# Patient Record
Sex: Female | Born: 1950 | Race: Black or African American | Hispanic: No | Smoking: Former smoker
Health system: Southern US, Community
[De-identification: ages and names within clinical notes are randomized; demographics above are authoritative.]

## PROBLEM LIST (undated history)

## (undated) ENCOUNTER — Ambulatory Visit (HOSPITAL_COMMUNITY): Admission: EM | Discharge status: Absent without leave | Payer: Medicare Other

## (undated) DIAGNOSIS — N189 Chronic kidney disease, unspecified: Secondary | ICD-10-CM

## (undated) DIAGNOSIS — M51369 Other intervertebral disc degeneration, lumbar region without mention of lumbar back pain or lower extremity pain: Secondary | ICD-10-CM

## (undated) DIAGNOSIS — M5136 Other intervertebral disc degeneration, lumbar region: Secondary | ICD-10-CM

## (undated) DIAGNOSIS — M8580 Other specified disorders of bone density and structure, unspecified site: Secondary | ICD-10-CM

## (undated) DIAGNOSIS — M810 Age-related osteoporosis without current pathological fracture: Secondary | ICD-10-CM

## (undated) DIAGNOSIS — IMO0002 Reserved for concepts with insufficient information to code with codable children: Secondary | ICD-10-CM

## (undated) DIAGNOSIS — K589 Irritable bowel syndrome without diarrhea: Secondary | ICD-10-CM

## (undated) DIAGNOSIS — I10 Essential (primary) hypertension: Secondary | ICD-10-CM

## (undated) DIAGNOSIS — M1990 Unspecified osteoarthritis, unspecified site: Secondary | ICD-10-CM

## (undated) DIAGNOSIS — T7840XA Allergy, unspecified, initial encounter: Secondary | ICD-10-CM

## (undated) HISTORY — DX: Other intervertebral disc degeneration, lumbar region without mention of lumbar back pain or lower extremity pain: M51.369

## (undated) HISTORY — DX: Age-related osteoporosis without current pathological fracture: M81.0

## (undated) HISTORY — PX: SHOULDER SURGERY: SHX246

## (undated) HISTORY — DX: Allergy, unspecified, initial encounter: T78.40XA

## (undated) HISTORY — DX: Other intervertebral disc degeneration, lumbar region: M51.36

---

## 1999-01-16 ENCOUNTER — Encounter: Payer: Self-pay | Admitting: Emergency Medicine

## 1999-01-16 ENCOUNTER — Emergency Department (HOSPITAL_COMMUNITY): Admission: EM | Admit: 1999-01-16 | Discharge: 1999-01-16 | Payer: Self-pay | Admitting: Emergency Medicine

## 2001-06-15 ENCOUNTER — Encounter: Payer: Self-pay | Admitting: Emergency Medicine

## 2001-06-15 ENCOUNTER — Emergency Department (HOSPITAL_COMMUNITY): Admission: EM | Admit: 2001-06-15 | Discharge: 2001-06-15 | Payer: Self-pay | Admitting: Emergency Medicine

## 2002-01-03 ENCOUNTER — Emergency Department (HOSPITAL_COMMUNITY): Admission: EM | Admit: 2002-01-03 | Discharge: 2002-01-03 | Payer: Self-pay | Admitting: Emergency Medicine

## 2003-11-15 ENCOUNTER — Ambulatory Visit (HOSPITAL_COMMUNITY): Admission: RE | Admit: 2003-11-15 | Discharge: 2003-11-15 | Payer: Self-pay | Admitting: Family Medicine

## 2004-02-15 ENCOUNTER — Encounter (INDEPENDENT_AMBULATORY_CARE_PROVIDER_SITE_OTHER): Payer: Self-pay | Admitting: Family Medicine

## 2004-02-15 LAB — CONVERTED CEMR LAB: Pap Smear: NORMAL

## 2004-02-29 ENCOUNTER — Ambulatory Visit (HOSPITAL_COMMUNITY): Admission: RE | Admit: 2004-02-29 | Discharge: 2004-02-29 | Payer: Self-pay | Admitting: Family Medicine

## 2004-04-25 ENCOUNTER — Ambulatory Visit: Payer: Self-pay | Admitting: Family Medicine

## 2004-04-26 ENCOUNTER — Ambulatory Visit (HOSPITAL_COMMUNITY): Admission: RE | Admit: 2004-04-26 | Discharge: 2004-04-26 | Payer: Self-pay | Admitting: Hematology and Oncology

## 2004-05-10 ENCOUNTER — Ambulatory Visit: Payer: Self-pay | Admitting: *Deleted

## 2004-05-29 ENCOUNTER — Ambulatory Visit: Payer: Self-pay | Admitting: Family Medicine

## 2004-10-29 ENCOUNTER — Ambulatory Visit: Payer: Self-pay | Admitting: Family Medicine

## 2004-11-29 ENCOUNTER — Ambulatory Visit: Payer: Self-pay | Admitting: Family Medicine

## 2005-02-19 ENCOUNTER — Ambulatory Visit: Payer: Self-pay | Admitting: Family Medicine

## 2005-03-30 ENCOUNTER — Ambulatory Visit (HOSPITAL_COMMUNITY): Admission: RE | Admit: 2005-03-30 | Discharge: 2005-03-30 | Payer: Self-pay | Admitting: Family Medicine

## 2005-04-01 ENCOUNTER — Ambulatory Visit (HOSPITAL_COMMUNITY): Admission: RE | Admit: 2005-04-01 | Discharge: 2005-04-01 | Payer: Self-pay | Admitting: Family Medicine

## 2005-04-02 ENCOUNTER — Ambulatory Visit: Payer: Self-pay | Admitting: Family Medicine

## 2005-07-08 ENCOUNTER — Ambulatory Visit: Payer: Self-pay | Admitting: Family Medicine

## 2005-10-06 ENCOUNTER — Ambulatory Visit: Payer: Self-pay | Admitting: Family Medicine

## 2005-11-13 ENCOUNTER — Ambulatory Visit: Payer: Self-pay | Admitting: Family Medicine

## 2005-11-19 ENCOUNTER — Ambulatory Visit: Payer: Self-pay | Admitting: Family Medicine

## 2005-11-21 ENCOUNTER — Ambulatory Visit: Payer: Self-pay | Admitting: Family Medicine

## 2006-01-16 ENCOUNTER — Ambulatory Visit: Payer: Self-pay | Admitting: *Deleted

## 2006-05-11 ENCOUNTER — Ambulatory Visit: Payer: Self-pay | Admitting: Family Medicine

## 2006-06-19 ENCOUNTER — Ambulatory Visit: Payer: Self-pay | Admitting: Internal Medicine

## 2006-08-24 ENCOUNTER — Ambulatory Visit: Payer: Self-pay | Admitting: Family Medicine

## 2006-11-30 ENCOUNTER — Ambulatory Visit (HOSPITAL_COMMUNITY): Admission: RE | Admit: 2006-11-30 | Discharge: 2006-11-30 | Payer: Self-pay | Admitting: Family Medicine

## 2006-12-11 ENCOUNTER — Ambulatory Visit: Payer: Self-pay | Admitting: Family Medicine

## 2007-03-19 ENCOUNTER — Ambulatory Visit: Payer: Self-pay | Admitting: Family Medicine

## 2007-04-21 ENCOUNTER — Encounter (INDEPENDENT_AMBULATORY_CARE_PROVIDER_SITE_OTHER): Payer: Self-pay | Admitting: *Deleted

## 2007-05-03 ENCOUNTER — Encounter (INDEPENDENT_AMBULATORY_CARE_PROVIDER_SITE_OTHER): Payer: Self-pay | Admitting: Family Medicine

## 2007-05-03 DIAGNOSIS — M1710 Unilateral primary osteoarthritis, unspecified knee: Secondary | ICD-10-CM | POA: Insufficient documentation

## 2007-05-03 DIAGNOSIS — I10 Essential (primary) hypertension: Secondary | ICD-10-CM | POA: Insufficient documentation

## 2007-05-03 DIAGNOSIS — IMO0002 Reserved for concepts with insufficient information to code with codable children: Secondary | ICD-10-CM

## 2007-05-19 DIAGNOSIS — G4700 Insomnia, unspecified: Secondary | ICD-10-CM | POA: Insufficient documentation

## 2007-05-19 DIAGNOSIS — M47817 Spondylosis without myelopathy or radiculopathy, lumbosacral region: Secondary | ICD-10-CM | POA: Insufficient documentation

## 2007-07-20 ENCOUNTER — Ambulatory Visit: Payer: Self-pay | Admitting: Internal Medicine

## 2007-07-22 ENCOUNTER — Ambulatory Visit: Payer: Self-pay | Admitting: Internal Medicine

## 2007-11-08 ENCOUNTER — Ambulatory Visit: Payer: Self-pay | Admitting: Family Medicine

## 2007-11-08 LAB — CONVERTED CEMR LAB
ALT: 19 units/L (ref 0–35)
AST: 20 units/L (ref 0–37)
Albumin: 4.5 g/dL (ref 3.5–5.2)
Basophils Relative: 2 % — ABNORMAL HIGH (ref 0–1)
CO2: 23 meq/L (ref 19–32)
Cholesterol: 230 mg/dL — ABNORMAL HIGH (ref 0–200)
Glucose, Bld: 161 mg/dL — ABNORMAL HIGH (ref 70–99)
HDL: 69 mg/dL (ref >39)
Lymphocytes Relative: 39 % (ref 12–46)
Lymphs Abs: 2.1 10*3/uL (ref 0.7–4.0)
MCHC: 32.5 g/dL (ref 30.0–36.0)
Marijuana Metabolite: NEGATIVE
Methadone: NEGATIVE
Monocytes Absolute: 0.6 10*3/uL (ref 0.1–1.0)
Monocytes Relative: 11 % (ref 3–12)
Neutrophils Relative %: 43 % (ref 43–77)
Opiate Screen, Urine: NEGATIVE
Platelets: 225 10*3/uL (ref 150–400)
RBC: 5.13 M/uL — ABNORMAL HIGH (ref 3.87–5.11)
Sodium: 141 meq/L (ref 135–145)
Triglycerides: 129 mg/dL (ref <150)
VLDL: 26 mg/dL (ref 0–40)

## 2007-12-02 ENCOUNTER — Ambulatory Visit (HOSPITAL_COMMUNITY): Admission: RE | Admit: 2007-12-02 | Discharge: 2007-12-02 | Payer: Self-pay | Admitting: Family Medicine

## 2008-01-27 ENCOUNTER — Ambulatory Visit (HOSPITAL_COMMUNITY): Admission: RE | Admit: 2008-01-27 | Discharge: 2008-01-27 | Payer: Self-pay | Admitting: Family Medicine

## 2008-01-28 ENCOUNTER — Ambulatory Visit: Payer: Self-pay | Admitting: Family Medicine

## 2008-01-28 LAB — CONVERTED CEMR LAB
Amphetamine Screen, Ur: NEGATIVE
Barbiturate Quant, Ur: NEGATIVE
Cocaine Metabolites: NEGATIVE
Creatinine,U: 131 mg/dL
Marijuana Metabolite: NEGATIVE
Methadone: NEGATIVE
Opiate Screen, Urine: NEGATIVE
Phencyclidine (PCP): NEGATIVE
Propoxyphene: NEGATIVE

## 2008-04-20 ENCOUNTER — Ambulatory Visit: Payer: Self-pay | Admitting: Family Medicine

## 2008-04-20 LAB — CONVERTED CEMR LAB
Amphetamine Screen, Ur: NEGATIVE
Barbiturate Quant, Ur: NEGATIVE
Methadone: NEGATIVE

## 2008-07-04 ENCOUNTER — Ambulatory Visit: Payer: Self-pay | Admitting: Family Medicine

## 2009-10-18 ENCOUNTER — Emergency Department (HOSPITAL_COMMUNITY): Admission: EM | Admit: 2009-10-18 | Discharge: 2009-10-18 | Payer: Self-pay | Admitting: Emergency Medicine

## 2009-10-31 ENCOUNTER — Ambulatory Visit: Payer: Self-pay | Admitting: Family Medicine

## 2009-11-02 ENCOUNTER — Ambulatory Visit: Payer: Self-pay | Admitting: Family Medicine

## 2009-11-05 ENCOUNTER — Ambulatory Visit: Payer: Self-pay | Admitting: Gastroenterology

## 2009-11-29 ENCOUNTER — Ambulatory Visit: Payer: Self-pay | Admitting: Gastroenterology

## 2009-11-30 ENCOUNTER — Ambulatory Visit (HOSPITAL_COMMUNITY): Admission: RE | Admit: 2009-11-30 | Discharge: 2009-11-30 | Payer: Self-pay | Admitting: Internal Medicine

## 2009-11-30 ENCOUNTER — Ambulatory Visit: Payer: Self-pay | Admitting: Gastroenterology

## 2009-12-03 ENCOUNTER — Encounter: Payer: Self-pay | Admitting: Gastroenterology

## 2009-12-28 ENCOUNTER — Ambulatory Visit: Payer: Self-pay | Admitting: Gastroenterology

## 2010-01-25 ENCOUNTER — Ambulatory Visit: Payer: Self-pay | Admitting: Gastroenterology

## 2010-02-25 ENCOUNTER — Ambulatory Visit: Payer: Self-pay | Admitting: Gastroenterology

## 2010-06-23 ENCOUNTER — Emergency Department (HOSPITAL_COMMUNITY): Admission: EM | Admit: 2010-06-23 | Discharge: 2010-06-23 | Payer: Self-pay | Admitting: Emergency Medicine

## 2010-07-23 ENCOUNTER — Encounter: Admission: RE | Admit: 2010-07-23 | Payer: Self-pay | Source: Home / Self Care | Admitting: Orthopaedic Surgery

## 2010-08-08 ENCOUNTER — Encounter
Admission: RE | Admit: 2010-08-08 | Discharge: 2010-09-03 | Payer: Self-pay | Source: Home / Self Care | Attending: Orthopaedic Surgery | Admitting: Orthopaedic Surgery

## 2010-08-25 ENCOUNTER — Encounter: Payer: Self-pay | Admitting: Family Medicine

## 2010-09-05 NOTE — Letter (Signed)
Summary: Patient Notice-Hyperplastic Polyps  [facility]  [address] [place], [place] [address]   Phone: [phone]  Fax: [phone]        Dec 03, 2009 MRN: [id number]    [facility] [address] [place], [place]  [address]    Dear Ms. Sharps,  I am pleased to inform you that the colon polyp(s) removed during your recent colonoscopy was (were) found to be hyperplastic.  These types of polyps are NOT pre-cancerous.  It is therefore my recommendation that you have a repeat colonoscopy examination in 10_ years for routine colorectal cancer screening.  Should you develop new or worsening symptoms of abdominal pain, bowel habit changes or bleeding from the rectum or bowels, please schedule an evaluation with either your primary care physician or with me.  Additional information/recommendations:  __No further action with gastroenterology is needed at this time.      Please follow-up with your primary care physician for your other      healthcare needs. __Please call [phone] to schedule a return visit to review      your situation.  __Please keep your follow-up visit as already scheduled.  _x_Continue treatment plan as outlined the day of your exam.  Please call us if you are having persistent problems or have questions about your condition that have not been fully answered at this time.  Sincerely,  Louis Meckel MD This letter has been electronically signed by your physician.  Appended Document: Patient Notice-Hyperplastic Polyps letter mailed 5.3.11

## 2010-09-05 NOTE — Procedures (Signed)
Summary: Colonoscopy  Patient: Alison Whitehead Note: All result statuses are Final unless otherwise noted.  Tests: (1) Colonoscopy (COL)   COL Colonoscopy           DONE     [facility]     [address]     [place], [place]  [address]           COLONOSCOPY PROCEDURE REPORT           PATIENT:  Alison Whitehead, Alison Whitehead  MR#:  [id number]     BIRTHDATE:  1951/02/09, 58 yrs. old  GENDER:  female           ENDOSCOPIST:  Barbette Hair. Arlyce Dice, MD     Referred by:           PROCEDURE DATE:  11/29/2009     PROCEDURE:  Colonoscopy with snare polypectomy     ASA CLASS:  Class II     INDICATIONS:  Per [place] IBS Study           MEDICATIONS:   Fentanyl 100 mcg IV, Versed 6 mg IV           DESCRIPTION OF PROCEDURE:   After the risks benefits and     alternatives of the procedure were thoroughly explained, informed     consent was obtained.  Digital rectal exam was performed and     revealed no abnormalities.   The LB CF-[id number] endoscope     was introduced through the anus and advanced to the cecum, which     was identified by both the appendix and ileocecal valve, without     limitations.  The quality of the prep was excellent, using     MoviPrep.  The instrument was then slowly withdrawn as the colon     was fully examined.     <<PROCEDUREIMAGES>>           FINDINGS:  A sessile polyp was found in the sigmoid colon. It was     4 mm in size. It was found 20 cm from the point of entry. Polyp     was snared without cautery. Retrieval was successful (see image9).     snare polyp  Scattered diverticula were found in the ascending     colon (see image4).  Mild diverticulosis was found in the sigmoid     to descending colon segments (see image8).  This was otherwise a     normal examination of the colon (see image2(minor scope trauma),     image3, image5, image6, image7, and image10).   Retroflexed views     in the rectum revealed no abnormalities.    The time to cecum =  6.0  minutes. The scope was then withdrawn (time =  7.25  min) from     the patient and the procedure completed.           COMPLICATIONS:  None           ENDOSCOPIC IMPRESSION:     1) 4 mm sessile polyp in the sigmoid colon     2) Diverticula, scattered in the ascending colon     3) Mild diverticulosis in the sigmoid to descending colon     segments     4) Otherwise normal examination     RECOMMENDATIONS:     1) If the polyp(s) removed today are proven to be adenomatous     (pre-cancerous) polyps, you will need a repeat colonoscopy in 5  years. Otherwise you should continue to follow colorectal cancer     screening guidelines for "routine risk" patients with colonoscopy     in 10 years.           REPEAT EXAM:   1)You will receive a letter from Dr. Arlyce Dice in 1-2     weeks, after reviewing the final pathology, with followup     recommendations for polyp surveillance     2) f/u per study protocol           ______________________________     Barbette Hair. Arlyce Dice, MD           CC:           n.     eSIGNED:   Barbette Hair. Nissa Stannard at 11/29/2009 11:43 AM           Loistine Simas, [id number]  Note: An exclamation mark (!) indicates a result that was not dispersed into the flowsheet. Document Creation Date: 11/29/2009 11:44 AM _______________________________________________________________________  (1) Order result status: Final Collection or observation date-time: 11/29/2009 11:33 Requested date-time:  Receipt date-time:  Reported date-time:  Referring Physician:   Ordering Physician: Melvia Heaps [phone]) Specimen Source:  Source: Launa Grill Order Number: [id number] Lab site:   Appended Document: Colonoscopy     Procedures Next Due Date:    Colonoscopy: 11/2019

## 2010-09-06 ENCOUNTER — Ambulatory Visit: Payer: Self-pay | Attending: Physical Therapy | Admitting: Physical Therapy

## 2010-09-06 DIAGNOSIS — M25659 Stiffness of unspecified hip, not elsewhere classified: Secondary | ICD-10-CM | POA: Insufficient documentation

## 2010-09-06 DIAGNOSIS — M2550 Pain in unspecified joint: Secondary | ICD-10-CM | POA: Insufficient documentation

## 2010-09-06 DIAGNOSIS — IMO0001 Reserved for inherently not codable concepts without codable children: Secondary | ICD-10-CM | POA: Insufficient documentation

## 2010-09-09 ENCOUNTER — Encounter: Payer: Self-pay | Admitting: Physical Therapy

## 2010-09-13 ENCOUNTER — Ambulatory Visit: Payer: Self-pay | Admitting: Physical Therapy

## 2010-09-16 ENCOUNTER — Ambulatory Visit: Payer: Self-pay | Admitting: Physical Therapy

## 2010-09-23 ENCOUNTER — Encounter: Payer: Self-pay | Admitting: Physical Therapy

## 2010-09-25 ENCOUNTER — Ambulatory Visit: Payer: Self-pay | Admitting: Physical Therapy

## 2010-10-01 ENCOUNTER — Encounter: Payer: Self-pay | Admitting: Physical Therapy

## 2010-10-03 ENCOUNTER — Ambulatory Visit: Payer: Self-pay | Attending: Orthopaedic Surgery | Admitting: Physical Therapy

## 2010-10-03 DIAGNOSIS — M2550 Pain in unspecified joint: Secondary | ICD-10-CM | POA: Insufficient documentation

## 2010-10-03 DIAGNOSIS — IMO0001 Reserved for inherently not codable concepts without codable children: Secondary | ICD-10-CM | POA: Insufficient documentation

## 2010-10-03 DIAGNOSIS — M25659 Stiffness of unspecified hip, not elsewhere classified: Secondary | ICD-10-CM | POA: Insufficient documentation

## 2010-10-07 ENCOUNTER — Ambulatory Visit: Payer: Self-pay | Admitting: Physical Therapy

## 2010-10-09 ENCOUNTER — Ambulatory Visit: Payer: Self-pay | Admitting: Physical Therapy

## 2010-10-14 ENCOUNTER — Ambulatory Visit: Payer: Self-pay | Admitting: Physical Therapy

## 2010-10-16 ENCOUNTER — Encounter: Payer: Self-pay | Admitting: Physical Therapy

## 2010-10-23 ENCOUNTER — Ambulatory Visit: Payer: Self-pay | Admitting: Physical Therapy

## 2010-11-25 ENCOUNTER — Other Ambulatory Visit (HOSPITAL_COMMUNITY): Payer: Self-pay | Admitting: Family Medicine

## 2010-11-25 DIAGNOSIS — Z1231 Encounter for screening mammogram for malignant neoplasm of breast: Secondary | ICD-10-CM

## 2010-12-05 ENCOUNTER — Ambulatory Visit (HOSPITAL_COMMUNITY): Payer: Self-pay | Attending: Family Medicine

## 2010-12-05 ENCOUNTER — Inpatient Hospital Stay (HOSPITAL_COMMUNITY): Admission: RE | Admit: 2010-12-05 | Payer: Self-pay | Source: Ambulatory Visit

## 2010-12-16 ENCOUNTER — Ambulatory Visit (HOSPITAL_COMMUNITY)
Admission: RE | Admit: 2010-12-16 | Discharge: 2010-12-16 | Disposition: A | Discharge status: Home / Self Care | Payer: Self-pay | Source: Ambulatory Visit | Attending: Family Medicine | Admitting: Family Medicine

## 2010-12-16 DIAGNOSIS — Z1231 Encounter for screening mammogram for malignant neoplasm of breast: Secondary | ICD-10-CM

## 2010-12-16 DIAGNOSIS — Z780 Asymptomatic menopausal state: Secondary | ICD-10-CM | POA: Insufficient documentation

## 2010-12-16 DIAGNOSIS — I10 Essential (primary) hypertension: Secondary | ICD-10-CM | POA: Insufficient documentation

## 2010-12-16 DIAGNOSIS — Z13820 Encounter for screening for osteoporosis: Secondary | ICD-10-CM | POA: Insufficient documentation

## 2011-01-16 ENCOUNTER — Emergency Department (HOSPITAL_COMMUNITY)
Admission: EM | Admit: 2011-01-16 | Discharge: 2011-01-16 | Disposition: A | Discharge status: Home / Self Care | Payer: Self-pay | Attending: Emergency Medicine | Admitting: Emergency Medicine

## 2011-01-16 DIAGNOSIS — M545 Low back pain, unspecified: Secondary | ICD-10-CM | POA: Insufficient documentation

## 2011-04-03 ENCOUNTER — Emergency Department (HOSPITAL_COMMUNITY)
Admission: EM | Admit: 2011-04-03 | Discharge: 2011-04-03 | Disposition: A | Discharge status: Home / Self Care | Payer: Self-pay | Attending: Emergency Medicine | Admitting: Emergency Medicine

## 2011-04-03 DIAGNOSIS — I10 Essential (primary) hypertension: Secondary | ICD-10-CM | POA: Insufficient documentation

## 2011-04-03 DIAGNOSIS — G8929 Other chronic pain: Secondary | ICD-10-CM | POA: Insufficient documentation

## 2011-04-03 DIAGNOSIS — M549 Dorsalgia, unspecified: Secondary | ICD-10-CM | POA: Insufficient documentation

## 2012-02-02 ENCOUNTER — Other Ambulatory Visit (HOSPITAL_COMMUNITY): Payer: Self-pay | Admitting: Internal Medicine

## 2012-02-02 DIAGNOSIS — Z1231 Encounter for screening mammogram for malignant neoplasm of breast: Secondary | ICD-10-CM

## 2012-02-24 ENCOUNTER — Ambulatory Visit (HOSPITAL_COMMUNITY)
Admission: RE | Admit: 2012-02-24 | Discharge: 2012-02-24 | Disposition: A | Discharge status: Home / Self Care | Payer: Self-pay | Source: Ambulatory Visit | Attending: Internal Medicine | Admitting: Internal Medicine

## 2012-02-24 DIAGNOSIS — Z1231 Encounter for screening mammogram for malignant neoplasm of breast: Secondary | ICD-10-CM | POA: Insufficient documentation

## 2012-04-11 ENCOUNTER — Emergency Department (HOSPITAL_COMMUNITY): Payer: Self-pay

## 2012-04-11 ENCOUNTER — Emergency Department (HOSPITAL_COMMUNITY)
Admission: EM | Admit: 2012-04-11 | Discharge: 2012-04-11 | Disposition: A | Discharge status: Home / Self Care | Payer: Self-pay | Attending: Emergency Medicine | Admitting: Emergency Medicine

## 2012-04-11 ENCOUNTER — Encounter (HOSPITAL_COMMUNITY): Payer: Self-pay | Admitting: Emergency Medicine

## 2012-04-11 DIAGNOSIS — M545 Low back pain, unspecified: Secondary | ICD-10-CM | POA: Insufficient documentation

## 2012-04-11 DIAGNOSIS — M899 Disorder of bone, unspecified: Secondary | ICD-10-CM | POA: Insufficient documentation

## 2012-04-11 DIAGNOSIS — I10 Essential (primary) hypertension: Secondary | ICD-10-CM | POA: Insufficient documentation

## 2012-04-11 DIAGNOSIS — F17200 Nicotine dependence, unspecified, uncomplicated: Secondary | ICD-10-CM | POA: Insufficient documentation

## 2012-04-11 DIAGNOSIS — S39012A Strain of muscle, fascia and tendon of lower back, initial encounter: Secondary | ICD-10-CM

## 2012-04-11 DIAGNOSIS — K589 Irritable bowel syndrome without diarrhea: Secondary | ICD-10-CM | POA: Insufficient documentation

## 2012-04-11 DIAGNOSIS — M129 Arthropathy, unspecified: Secondary | ICD-10-CM | POA: Insufficient documentation

## 2012-04-11 HISTORY — DX: Reserved for concepts with insufficient information to code with codable children: IMO0002

## 2012-04-11 HISTORY — DX: Irritable bowel syndrome, unspecified: K58.9

## 2012-04-11 HISTORY — DX: Essential (primary) hypertension: I10

## 2012-04-11 HISTORY — DX: Unspecified osteoarthritis, unspecified site: M19.90

## 2012-04-11 HISTORY — DX: Other specified disorders of bone density and structure, unspecified site: M85.80

## 2012-04-11 MED ORDER — HYDROCODONE-ACETAMINOPHEN 5-500 MG PO TABS: 1.0000 | ORAL_TABLET | Freq: Four times a day (QID) | ORAL | Status: AC | PRN

## 2012-04-11 MED ORDER — HYDROCODONE-ACETAMINOPHEN 5-325 MG PO TABS
2.0000 | ORAL_TABLET | Freq: Once | ORAL | Status: AC
  Administered 2012-04-11: 2 via ORAL
  Filled 2012-04-11: qty 2

## 2012-04-11 MED ORDER — KETOROLAC TROMETHAMINE 60 MG/2ML IM SOLN
60.0000 mg | Freq: Once | INTRAMUSCULAR | Status: AC
  Administered 2012-04-11: 60 mg via INTRAMUSCULAR
  Filled 2012-04-11: qty 2

## 2012-04-11 NOTE — ED Notes (Signed)
Pt states she fell earlier in the week after slipping in water.  Reports pain and tingling in L leg, lower back pain, and pinching pain in back of neck.

## 2012-04-11 NOTE — ED Provider Notes (Signed)
History  Scribed for Geoffery Lyons, MD, the patient was seen in room TR06C/TR06C. This chart was scribed by Candelaria Stagers. The patient's care started at 5:23 PM   CSN: [id number]  Arrival date & time 04/11/12  1645   First MD Initiated Contact with Patient 04/11/12 1722      Chief Complaint  Patient presents with  . Back Pain     The history is provided by the patient. No language interpreter was used.   Alison Whitehead is a 61 y.o. female who presents to the Emergency Department complaining of lower back pain after slipping in water, falling several days ago.  She is experiencing pain and tingling in the legs as well as pain in the back of the neck.  She states that she is experiencing intermittent spasms when moving.  She denies bowel problems or trouble urinating.  She has taken tramadol and ibuprofen with no relief.  Pt has h/o degenerative disc disease.     Past Medical History  Diagnosis Date  . Hypertension   . IBS (irritable bowel syndrome)   . Osteopenia   . Arthritis   . DDD (degenerative disc disease)     History reviewed. No pertinent past surgical history.  No family history on file.  History  Substance Use Topics  . Smoking status: Current Everyday Smoker  . Smokeless tobacco: Not on file  . Alcohol Use: No    OB History    Grav Para Term Preterm Abortions TAB SAB Ect Mult Living                  Review of Systems  HENT: Positive for neck pain.   Genitourinary: Negative for difficulty urinating.  Musculoskeletal: Positive for back pain (lower back pain) and arthralgias (left leg pain).  All other systems reviewed and are negative.    Allergies  Penicillins  Home Medications   Current Outpatient Rx  Name Route Sig Dispense Refill  . HYDROCHLOROTHIAZIDE 25 MG PO TABS Oral Take 25 mg by mouth daily.    . TRAMADOL HCL 50 MG PO TABS Oral Take 50 mg by mouth every 6 (six) hours as needed. For pain    . VERAPAMIL HCL ER 240 MG PO TBCR Oral  Take 240 mg by mouth daily.      BP 162/102  Pulse 82  Temp 98.4 F (36.9 C) (Oral)  Resp 16  SpO2 96%  Physical Exam  Nursing note and vitals reviewed. Constitutional: She is oriented to person, place, and time. She appears well-developed and well-nourished. No distress.  HENT:  Head: Normocephalic and atraumatic.  Eyes: EOM are normal. Pupils are equal, round, and reactive to light.  Neck: Neck supple. No tracheal deviation present.  Pulmonary/Chest: Effort normal. No respiratory distress.  Musculoskeletal:       Tender on palpation of lumbar region.  DTR 1+ equal bilaterally.  Strength 5/5 of lower extremities.  Ambulates without difficulty.    Neurological: She is alert and oriented to person, place, and time.  Skin: Skin is warm and dry.  Psychiatric: She has a normal mood and affect. Her behavior is normal.    ED Course  Procedures   DIAGNOSTIC STUDIES: Oxygen Saturation is 96% on room air, normal by my interpretation.    COORDINATION OF CARE:  17:30 Ordered: DG Lumbar Spine Complete   Labs Reviewed - No data to display No results found.   No diagnosis found.    MDM  The patient presents with complaints  of low back pain after a fall several days ago.  She has a history of low back problems in the past.  On today's exam, there are no concerns for cauda equina syndrome or other emergent condition.  She is feeling better with the meds given and will be discharged with the same.  To return prn, follow up with pcp if not improving in the next week.      I personally performed the services described in this documentation, which was scribed in my presence. The recorded information has been reviewed and considered.           Geoffery Lyons, MD 04/11/12 [phone]

## 2012-08-30 ENCOUNTER — Emergency Department (HOSPITAL_COMMUNITY)
Admission: EM | Admit: 2012-08-30 | Discharge: 2012-08-31 | Disposition: A | Discharge status: Home / Self Care | Payer: Self-pay | Attending: Emergency Medicine | Admitting: Emergency Medicine

## 2012-08-30 DIAGNOSIS — F17200 Nicotine dependence, unspecified, uncomplicated: Secondary | ICD-10-CM | POA: Insufficient documentation

## 2012-08-30 DIAGNOSIS — M5431 Sciatica, right side: Secondary | ICD-10-CM

## 2012-08-30 DIAGNOSIS — Z8719 Personal history of other diseases of the digestive system: Secondary | ICD-10-CM | POA: Insufficient documentation

## 2012-08-30 DIAGNOSIS — Z8739 Personal history of other diseases of the musculoskeletal system and connective tissue: Secondary | ICD-10-CM | POA: Insufficient documentation

## 2012-08-30 DIAGNOSIS — I10 Essential (primary) hypertension: Secondary | ICD-10-CM | POA: Insufficient documentation

## 2012-08-30 DIAGNOSIS — R5381 Other malaise: Secondary | ICD-10-CM | POA: Insufficient documentation

## 2012-08-30 DIAGNOSIS — M5430 Sciatica, unspecified side: Secondary | ICD-10-CM | POA: Insufficient documentation

## 2012-08-30 DIAGNOSIS — Z79899 Other long term (current) drug therapy: Secondary | ICD-10-CM | POA: Insufficient documentation

## 2012-08-30 NOTE — ED Notes (Addendum)
Presents with right buttock pain described as sharp and spasm that radiates down right leg. HX of same. This episode began one week ago. She has been using heat, over the counter pain medication, and a few left over vicodin, nothing is helping. CMS intact. No edema noted.

## 2012-08-31 MED ORDER — DIAZEPAM 5 MG PO TABS
5.0000 mg | ORAL_TABLET | Freq: Once | ORAL | Status: AC
  Administered 2012-08-31: 5 mg via ORAL
  Filled 2012-08-31: qty 1

## 2012-08-31 MED ORDER — PREDNISONE 20 MG PO TABS
60.0000 mg | ORAL_TABLET | Freq: Once | ORAL | Status: AC
  Administered 2012-08-31: 60 mg via ORAL
  Filled 2012-08-31: qty 3

## 2012-08-31 MED ORDER — DIAZEPAM 5 MG PO TABS: 2.5000 mg | ORAL_TABLET | Freq: Four times a day (QID) | ORAL | Status: DC | PRN

## 2012-08-31 MED ORDER — HYDROMORPHONE HCL PF 1 MG/ML IJ SOLN: 0.5000 mg | Freq: Once | INTRAMUSCULAR | Status: DC

## 2012-08-31 MED ORDER — HYDROCODONE-ACETAMINOPHEN 5-325 MG PO TABS: 1.0000 | ORAL_TABLET | Freq: Three times a day (TID) | ORAL | Status: DC | PRN

## 2012-08-31 MED ORDER — PREDNISONE 20 MG PO TABS: ORAL_TABLET | ORAL | Status: DC
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
## 2012-08-31 NOTE — ED Provider Notes (Signed)
Medical screening examination/treatment/procedure(s) were performed by non-physician practitioner and as supervising physician I was immediately available for consultation/collaboration.  Sunnie Nielsen, MD 08/31/12 [phone]

## 2012-09-28 IMAGING — CR DG LUMBAR SPINE COMPLETE 4+V
5 series · 5 of 5 positions shown · non-contrast
Comparison: 10/18/2009.

CLINICAL DATA: Back and left leg pain.

LUMBAR SPINE - COMPLETE 4+ VIEW

[t lumbar spine ap]
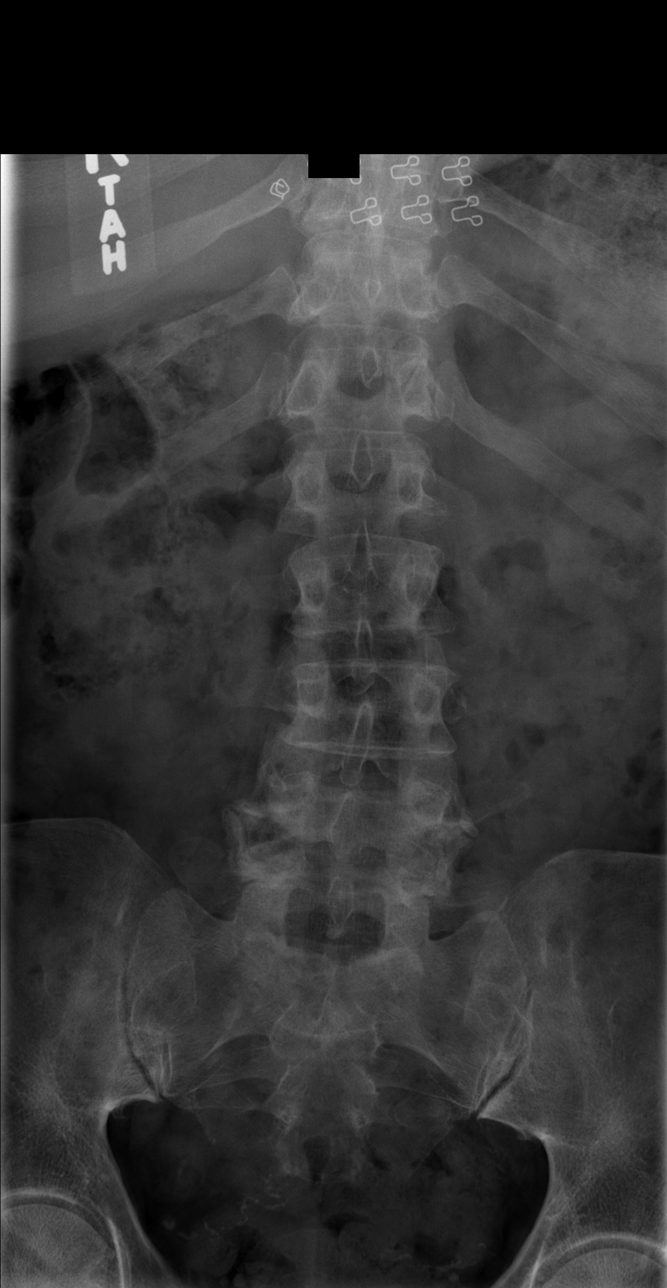

[t lumbar spine obl (1 of 2)]
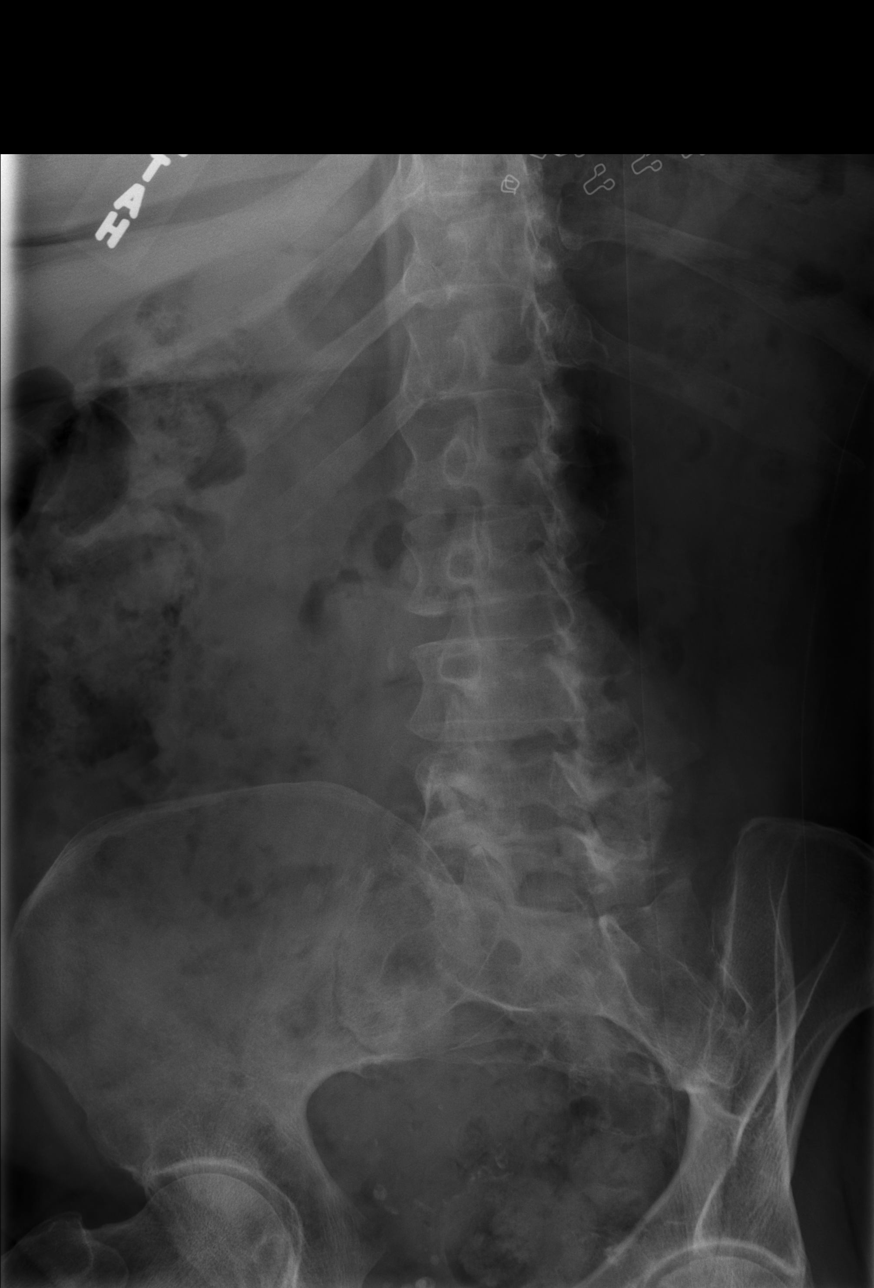

[t lumbar spine obl (2 of 2)]
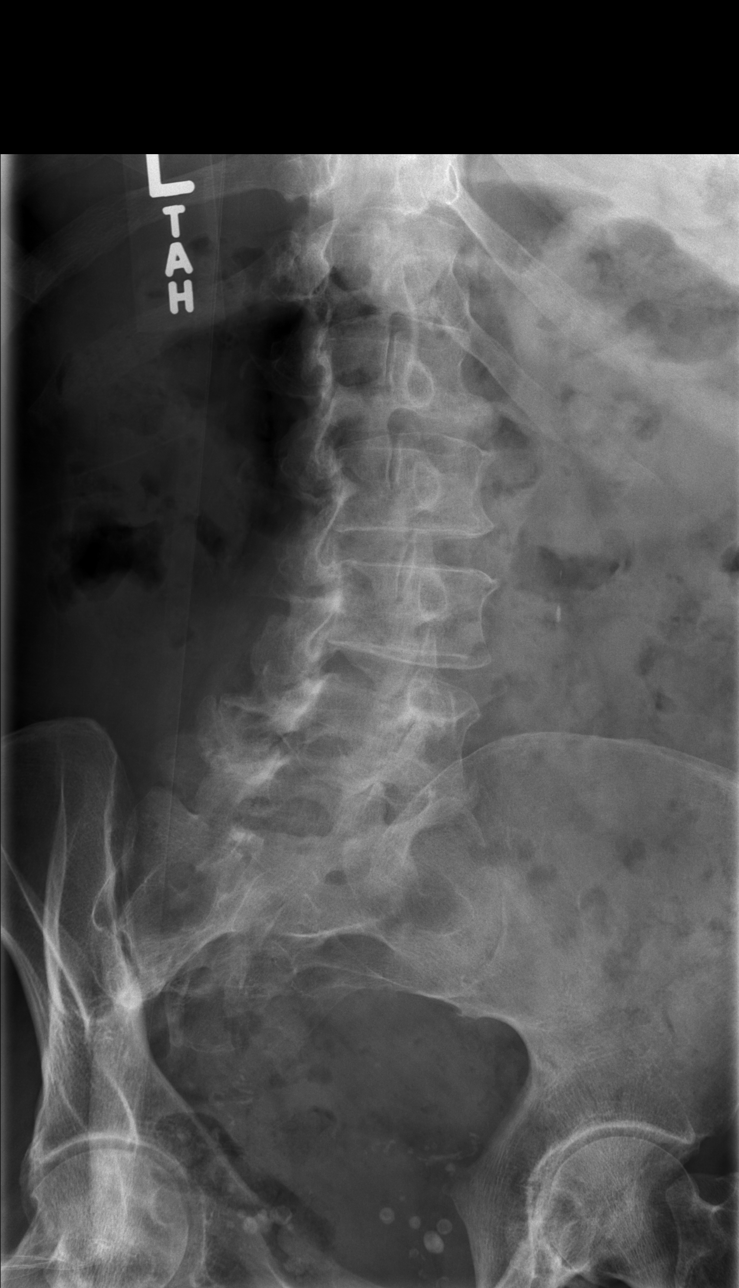

[t lumbar spine lat]
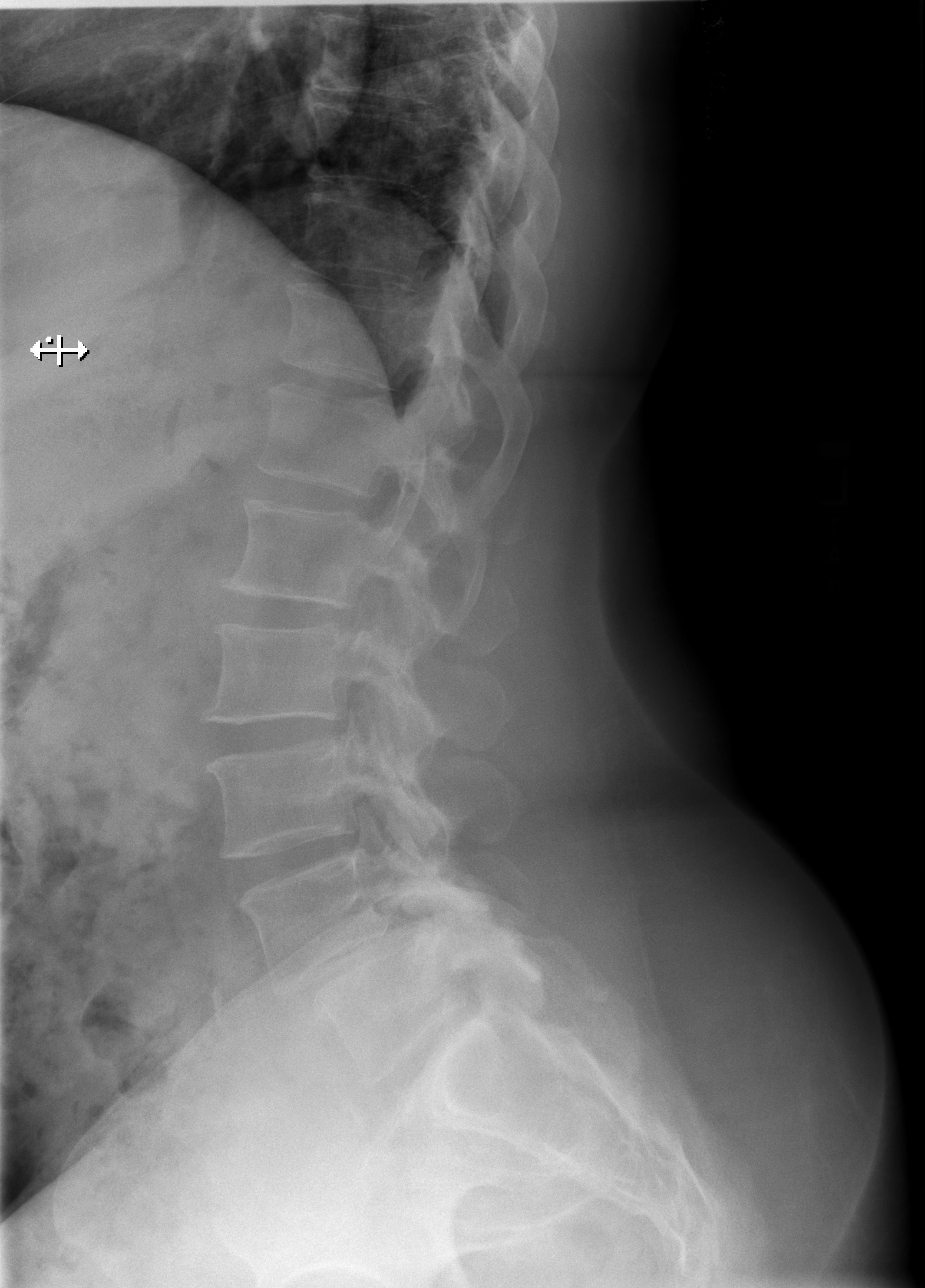

[t lumbar l-5 s-1 spot]
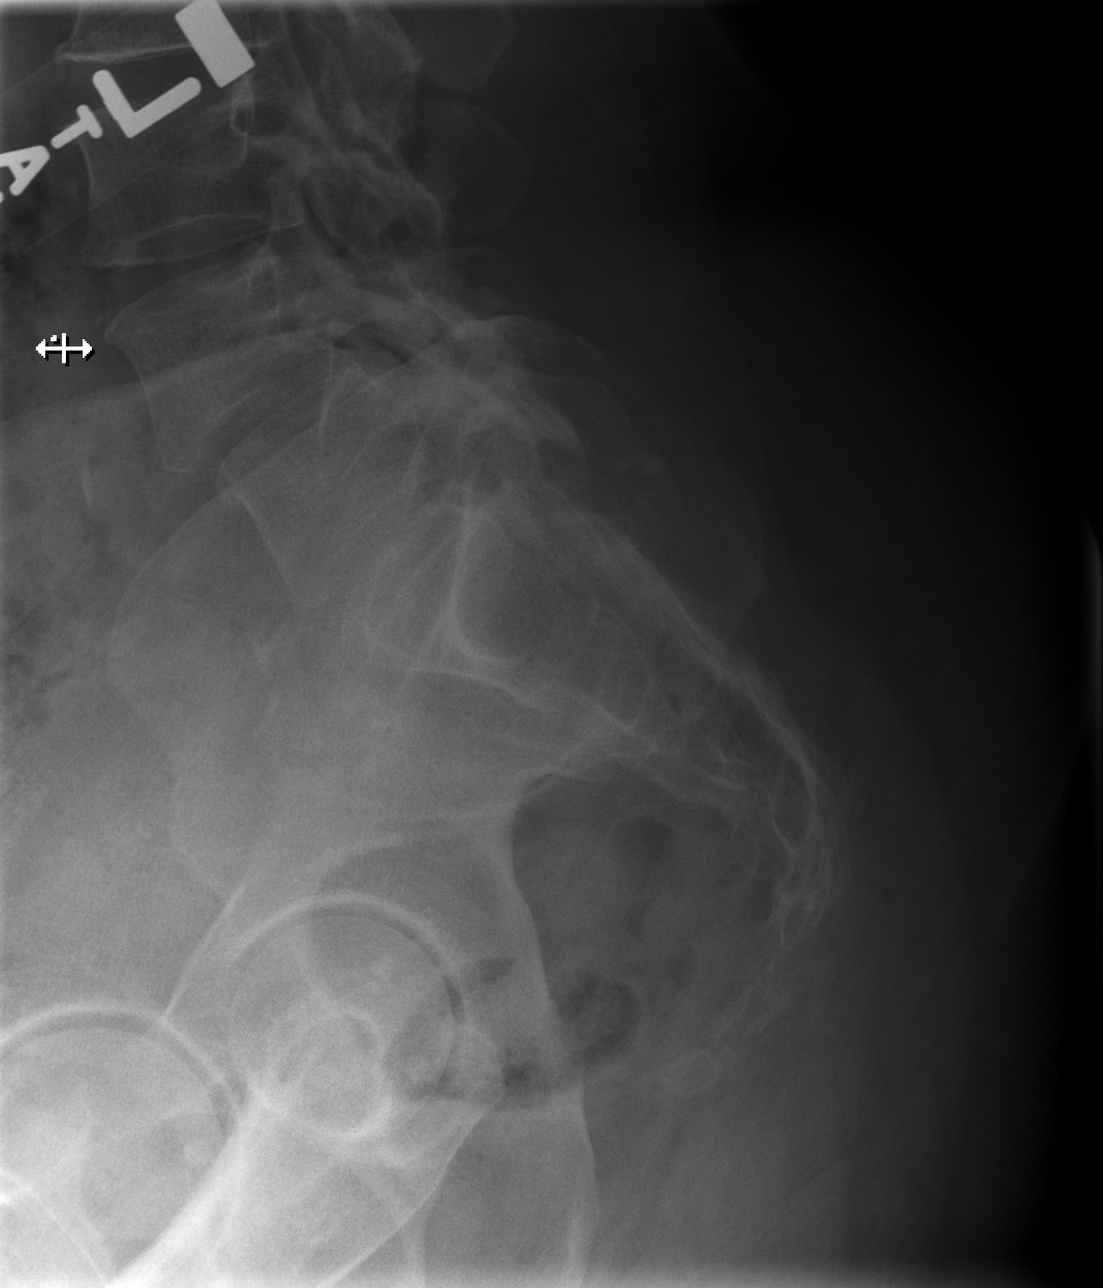

[5 of 5 positions shown; findings below may reference images not displayed]

FINDINGS: Stable degenerative spondylolisthesis at L4 with severe
facet disease.  Stable degenerative disease at L4-5.  The remaining
lumbar vertebral bodies are normally aligned.  No acute bony
findings.  The bony pelvis is intact.
IMPRESSION: 1.  Stable degenerative spondylolisthesis at L4 with advanced disc
disease and facet disease.
2.  No other significant findings.

## 2013-01-05 ENCOUNTER — Emergency Department (HOSPITAL_COMMUNITY): Payer: Medicaid Other

## 2013-01-05 ENCOUNTER — Encounter (HOSPITAL_COMMUNITY): Payer: Self-pay | Admitting: Emergency Medicine

## 2013-01-05 ENCOUNTER — Emergency Department (HOSPITAL_COMMUNITY)
Admission: EM | Admit: 2013-01-05 | Discharge: 2013-01-05 | Disposition: A | Discharge status: Home / Self Care | Payer: Medicaid Other | Attending: Emergency Medicine | Admitting: Emergency Medicine

## 2013-01-05 DIAGNOSIS — Y929 Unspecified place or not applicable: Secondary | ICD-10-CM | POA: Insufficient documentation

## 2013-01-05 DIAGNOSIS — Y9389 Activity, other specified: Secondary | ICD-10-CM | POA: Insufficient documentation

## 2013-01-05 DIAGNOSIS — F17200 Nicotine dependence, unspecified, uncomplicated: Secondary | ICD-10-CM | POA: Insufficient documentation

## 2013-01-05 DIAGNOSIS — I10 Essential (primary) hypertension: Secondary | ICD-10-CM | POA: Insufficient documentation

## 2013-01-05 DIAGNOSIS — Z880 Allergy status to penicillin: Secondary | ICD-10-CM | POA: Insufficient documentation

## 2013-01-05 DIAGNOSIS — W010XXA Fall on same level from slipping, tripping and stumbling without subsequent striking against object, initial encounter: Secondary | ICD-10-CM | POA: Insufficient documentation

## 2013-01-05 DIAGNOSIS — Z8739 Personal history of other diseases of the musculoskeletal system and connective tissue: Secondary | ICD-10-CM | POA: Insufficient documentation

## 2013-01-05 DIAGNOSIS — S92253A Displaced fracture of navicular [scaphoid] of unspecified foot, initial encounter for closed fracture: Secondary | ICD-10-CM | POA: Insufficient documentation

## 2013-01-05 DIAGNOSIS — Z79899 Other long term (current) drug therapy: Secondary | ICD-10-CM | POA: Insufficient documentation

## 2013-01-05 DIAGNOSIS — IMO0002 Reserved for concepts with insufficient information to code with codable children: Secondary | ICD-10-CM | POA: Insufficient documentation

## 2013-01-05 DIAGNOSIS — Z8719 Personal history of other diseases of the digestive system: Secondary | ICD-10-CM | POA: Insufficient documentation

## 2013-01-05 DIAGNOSIS — S92109A Unspecified fracture of unspecified talus, initial encounter for closed fracture: Secondary | ICD-10-CM | POA: Insufficient documentation

## 2013-01-05 DIAGNOSIS — S82891A Other fracture of right lower leg, initial encounter for closed fracture: Secondary | ICD-10-CM

## 2013-01-05 MED ORDER — IBUPROFEN 400 MG PO TABS
400.0000 mg | ORAL_TABLET | Freq: Once | ORAL | Status: AC
  Administered 2013-01-05: 400 mg via ORAL
  Filled 2013-01-05: qty 1

## 2013-01-05 MED ORDER — OXYCODONE-ACETAMINOPHEN 5-325 MG PO TABS: ORAL_TABLET | ORAL | Status: DC

## 2013-01-05 NOTE — ED Notes (Signed)
Pt ambulated to restroom. 

## 2013-01-05 NOTE — ED Notes (Addendum)
Pt states that she had prednisone and Vicodin left from her last back spasm and reports that they helped but she is now out. Pt state that she has not had her BP medications in months.
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
## 2013-01-05 NOTE — Progress Notes (Signed)
Orthopedic Tech Progress Note Patient Details:  Alison Whitehead 1951/04/06 [id number]  Ortho Devices Type of Ortho Device: Ankle Air splint Ortho Device/Splint Interventions: Application   Cammer, Mickie Bail 01/05/2013, 1:47 PM

## 2013-01-05 NOTE — Progress Notes (Signed)
Orthopedic Tech Progress Note Patient Details:  Alison Whitehead 02/22/1951 [id number]  Ortho Devices Type of Ortho Device: Ankle Air splint Ortho Device/Splint Interventions: Application   Cammer, Michail Boyte Carol 01/05/2013, 1:47 PM  

## 2013-01-06 NOTE — ED Provider Notes (Signed)
Medical screening examination/treatment/procedure(s) were performed by non-physician practitioner and as supervising physician I was immediately available for consultation/collaboration.   Anacaren Kohan E Myria Steenbergen, MD 01/06/13 0728 

## 2013-02-25 ENCOUNTER — Emergency Department (HOSPITAL_COMMUNITY)
Admission: EM | Admit: 2013-02-25 | Discharge: 2013-02-25 | Disposition: A | Discharge status: Home / Self Care | Payer: Medicaid Other | Attending: Emergency Medicine | Admitting: Emergency Medicine

## 2013-02-25 ENCOUNTER — Encounter (HOSPITAL_COMMUNITY): Payer: Self-pay | Admitting: *Deleted

## 2013-02-25 ENCOUNTER — Emergency Department (HOSPITAL_COMMUNITY)
Admission: EM | Admit: 2013-02-25 | Discharge: 2013-02-25 | Discharge status: Absent without leave | Payer: Medicaid Other | Source: Home / Self Care

## 2013-02-25 DIAGNOSIS — I10 Essential (primary) hypertension: Secondary | ICD-10-CM | POA: Insufficient documentation

## 2013-02-25 DIAGNOSIS — Z8719 Personal history of other diseases of the digestive system: Secondary | ICD-10-CM | POA: Insufficient documentation

## 2013-02-25 DIAGNOSIS — F17200 Nicotine dependence, unspecified, uncomplicated: Secondary | ICD-10-CM | POA: Insufficient documentation

## 2013-02-25 DIAGNOSIS — Z79899 Other long term (current) drug therapy: Secondary | ICD-10-CM | POA: Insufficient documentation

## 2013-02-25 DIAGNOSIS — M25562 Pain in left knee: Secondary | ICD-10-CM

## 2013-02-25 DIAGNOSIS — W010XXA Fall on same level from slipping, tripping and stumbling without subsequent striking against object, initial encounter: Secondary | ICD-10-CM | POA: Insufficient documentation

## 2013-02-25 DIAGNOSIS — M1990 Unspecified osteoarthritis, unspecified site: Secondary | ICD-10-CM | POA: Insufficient documentation

## 2013-02-25 DIAGNOSIS — Z880 Allergy status to penicillin: Secondary | ICD-10-CM | POA: Insufficient documentation

## 2013-02-25 DIAGNOSIS — Y9389 Activity, other specified: Secondary | ICD-10-CM | POA: Insufficient documentation

## 2013-02-25 DIAGNOSIS — IMO0002 Reserved for concepts with insufficient information to code with codable children: Secondary | ICD-10-CM | POA: Insufficient documentation

## 2013-02-25 DIAGNOSIS — M549 Dorsalgia, unspecified: Secondary | ICD-10-CM

## 2013-02-25 DIAGNOSIS — Y92009 Unspecified place in unspecified non-institutional (private) residence as the place of occurrence of the external cause: Secondary | ICD-10-CM | POA: Insufficient documentation

## 2013-02-25 MED ORDER — HYDROCODONE-ACETAMINOPHEN 5-325 MG PO TABS
1.0000 | ORAL_TABLET | Freq: Once | ORAL | Status: AC
  Administered 2013-02-25: 1 via ORAL
  Filled 2013-02-25: qty 1

## 2013-02-25 MED ORDER — ONDANSETRON 4 MG PO TBDP
8.0000 mg | ORAL_TABLET | Freq: Once | ORAL | Status: AC
  Administered 2013-02-25: 8 mg via ORAL
  Filled 2013-02-25: qty 2

## 2013-02-25 MED ORDER — PROMETHAZINE HCL 25 MG PO TABS: 25.0000 mg | ORAL_TABLET | Freq: Four times a day (QID) | ORAL | Status: DC | PRN

## 2013-02-25 MED ORDER — HYDROCODONE-ACETAMINOPHEN 5-325 MG PO TABS: 2.0000 | ORAL_TABLET | Freq: Four times a day (QID) | ORAL | Status: DC | PRN

## 2013-02-25 NOTE — ED Notes (Signed)
Fell Saturday. Developed pain tuesday. C/o back, leg and knee pain. Abrasion noted to L great toe bunion. Walking with cane. Alert, NAD, calm, interactive, steady gait.

## 2013-02-25 NOTE — ED Provider Notes (Signed)
CSN: [id number]     Arrival date & time 02/25/13  2020 History  This chart was scribed for non-physician practitioner working with Suzi Roots, MD, by Ardelia Mems ED Scribe. This patient was seen in room TR09C/TR09C and the patient's care was started at 9:58 PM.   First MD Initiated Contact with Patient 02/25/13 2139     Chief Complaint  Patient presents with  . Fall  . Leg Pain  . Back Pain    The history is provided by the patient. No language interpreter was used.   HPI Comments:  Alison Whitehead is a 62 y.o. female who presents to the Emergency Department complaining of gradual onset, gradually worsening, constant, moderate lower back pain and left knee pain onset 3 days ago after an accidental fall that occurred 6 days ago. She denies head injury or LOC. She describes her pain as "sharp" and as "pressure". She states that she was walking in her house and slipped on the wet floors that she had just cleaned. She also reports intermittent tingling in her left lower leg, but denies weakness or numbness. She states that lower back pain is worse on the right side. Her pain is relieved by lying supine and rest, and worsened with walking. She ambulates with a cane. She states that she had an accidental fall about 2 months ago and fractured an ankle. She states that she has degenerative disc disease and arthritis in both knees which she states may contribute to her falls. She denies fever, chills, nausea, vomiting or any other symptoms.    Past Medical History  Diagnosis Date  . Hypertension   . IBS (irritable bowel syndrome)   . Osteopenia   . Arthritis   . DDD (degenerative disc disease)    History reviewed. No pertinent past surgical history. No family history on file. History  Substance Use Topics  . Smoking status: Current Every Day Smoker  . Smokeless tobacco: Not on file  . Alcohol Use: No   OB History   Grav Para Term Preterm Abortions TAB SAB Ect Mult Living                  Review of Systems  Constitutional: Negative for fever and chills.  Gastrointestinal: Negative for nausea and vomiting.  Musculoskeletal: Positive for back pain.       Left knee pain.  Neurological: Negative for syncope, weakness, numbness and headaches.       Positive for tingling.  All other systems reviewed and are negative.    Allergies  Penicillins  Home Medications   Current Outpatient Rx  Name  Route  Sig  Dispense  Refill  . hydrochlorothiazide (HYDRODIURIL) 25 MG tablet   Oral   Take 25 mg by mouth daily.         . verapamil (CALAN-SR) 240 MG CR tablet   Oral   Take 240 mg by mouth at bedtime.          Triage Vitals: BP 149/97  Pulse 89  Temp(Src) 99 F (37.2 C) (Oral)  Resp 16  SpO2 93%  Physical Exam  Nursing note and vitals reviewed. Constitutional: She is oriented to person, place, and time. She appears well-developed and well-nourished. No distress.  HENT:  Head: Normocephalic and atraumatic.  Right Ear: External ear normal.  Left Ear: External ear normal.  Nose: Nose normal.  Mouth/Throat: Oropharynx is clear and moist.  Eyes: Conjunctivae are normal.  Neck: Normal range of motion.  Cardiovascular: Normal  rate, regular rhythm and normal heart sounds.   Pulmonary/Chest: Effort normal and breath sounds normal. No stridor. No respiratory distress. She has no wheezes. She has no rales.  Abdominal: Soft. She exhibits no distension.  Musculoskeletal: Normal range of motion.  Tender to palpation on the right side of her lumbar back. Tender to palpation on left knee, diffusely. No calf tenderness. Can walk with an antalgic gait with a cane.  Neurological: She is alert and oriented to person, place, and time. She has normal strength.  Neurovascularly intact. Strength 5/5.  Skin: Skin is warm and dry. She is not diaphoretic. No erythema.  Psychiatric: She has a normal mood and affect. Her behavior is normal.    ED Course   Procedures  (including critical care time)  DIAGNOSTIC STUDIES: Oxygen Saturation is 93% on RA, adequate by my interpretation.    COORDINATION OF CARE: 9:59 PM- Pt advised of plan for treatment with Norco and Zofran in the ED and with prescriptions upon discharge and pt agrees.  Medications  HYDROcodone-acetaminophen (NORCO/VICODIN) 5-325 MG per tablet 1 tablet (not administered)  ondansetron (ZOFRAN-ODT) disintegrating tablet 8 mg (not administered)    Labs Reviewed - No data to display  No results found.  1. Back pain   2. Knee pain, left     MDM  Patient with back pain.  No neurological deficits and normal neuro exam.  Patient can walk but states is painful.  No loss of bowel or bladder control.  No concern for cauda equina.  No fever, night sweats, weight loss, h/o cancer, IVDU.  RICE protocol and pain medicine indicated and discussed with patient.      I personally performed the services described in this documentation, which was scribed in my presence. The recorded information has been reviewed and is accurate.     Mora Bellman, PA-C 02/26/13 0106

## 2013-02-25 NOTE — ED Notes (Signed)
Pt alert and oriented, with steady gait at time of discharge. Pt given discharge papers and papers explained. All questions answered and pt walked to discharge.  

## 2013-03-01 NOTE — ED Provider Notes (Signed)
Medical screening examination/treatment/procedure(s) were performed by non-physician practitioner and as supervising physician I was immediately available for consultation/collaboration.   Cinthya Bors E Stevie Ertle, MD 03/01/13 0832 

## 2013-03-08 ENCOUNTER — Other Ambulatory Visit (HOSPITAL_COMMUNITY): Payer: Self-pay | Admitting: Family Medicine

## 2013-03-08 DIAGNOSIS — Z1231 Encounter for screening mammogram for malignant neoplasm of breast: Secondary | ICD-10-CM

## 2013-03-09 ENCOUNTER — Ambulatory Visit (HOSPITAL_COMMUNITY): Payer: Medicaid Other

## 2013-03-29 ENCOUNTER — Ambulatory Visit (HOSPITAL_COMMUNITY): Payer: Medicaid Other | Attending: Family Medicine

## 2013-04-01 ENCOUNTER — Ambulatory Visit (HOSPITAL_COMMUNITY): Payer: Medicaid Other

## 2013-06-22 ENCOUNTER — Other Ambulatory Visit (HOSPITAL_COMMUNITY): Payer: Self-pay | Admitting: Specialist

## 2013-06-22 DIAGNOSIS — Z1231 Encounter for screening mammogram for malignant neoplasm of breast: Secondary | ICD-10-CM

## 2013-07-08 ENCOUNTER — Ambulatory Visit (HOSPITAL_COMMUNITY)
Admission: RE | Admit: 2013-07-08 | Discharge: 2013-07-08 | Disposition: A | Discharge status: Home / Self Care | Payer: Medicaid Other | Source: Ambulatory Visit | Attending: Specialist | Admitting: Specialist

## 2013-07-08 DIAGNOSIS — Z1231 Encounter for screening mammogram for malignant neoplasm of breast: Secondary | ICD-10-CM | POA: Insufficient documentation

## 2013-07-22 ENCOUNTER — Encounter (HOSPITAL_COMMUNITY): Payer: Self-pay | Admitting: Emergency Medicine

## 2013-07-22 ENCOUNTER — Emergency Department (HOSPITAL_COMMUNITY)
Admission: EM | Admit: 2013-07-22 | Discharge: 2013-07-22 | Disposition: A | Discharge status: Home / Self Care | Payer: Medicaid Other | Attending: Emergency Medicine | Admitting: Emergency Medicine

## 2013-07-22 DIAGNOSIS — M549 Dorsalgia, unspecified: Secondary | ICD-10-CM

## 2013-07-22 DIAGNOSIS — I10 Essential (primary) hypertension: Secondary | ICD-10-CM | POA: Insufficient documentation

## 2013-07-22 DIAGNOSIS — Y929 Unspecified place or not applicable: Secondary | ICD-10-CM | POA: Insufficient documentation

## 2013-07-22 DIAGNOSIS — F17200 Nicotine dependence, unspecified, uncomplicated: Secondary | ICD-10-CM | POA: Insufficient documentation

## 2013-07-22 DIAGNOSIS — K589 Irritable bowel syndrome without diarrhea: Secondary | ICD-10-CM | POA: Insufficient documentation

## 2013-07-22 DIAGNOSIS — Z880 Allergy status to penicillin: Secondary | ICD-10-CM | POA: Insufficient documentation

## 2013-07-22 DIAGNOSIS — IMO0002 Reserved for concepts with insufficient information to code with codable children: Secondary | ICD-10-CM | POA: Insufficient documentation

## 2013-07-22 DIAGNOSIS — Z79899 Other long term (current) drug therapy: Secondary | ICD-10-CM | POA: Insufficient documentation

## 2013-07-22 DIAGNOSIS — Z791 Long term (current) use of non-steroidal anti-inflammatories (NSAID): Secondary | ICD-10-CM | POA: Insufficient documentation

## 2013-07-22 DIAGNOSIS — M129 Arthropathy, unspecified: Secondary | ICD-10-CM | POA: Insufficient documentation

## 2013-07-22 DIAGNOSIS — Y9389 Activity, other specified: Secondary | ICD-10-CM | POA: Insufficient documentation

## 2013-07-22 MED ORDER — MELOXICAM 7.5 MG PO TABS: 15.0000 mg | ORAL_TABLET | Freq: Every day | ORAL | Status: DC

## 2013-07-22 MED ORDER — OXYCODONE-ACETAMINOPHEN 5-325 MG PO TABS: 1.0000 | ORAL_TABLET | ORAL | Status: DC | PRN
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
## 2013-07-22 NOTE — ED Notes (Signed)
Patient with pain worse than usual at lower back across hips and lower neck at shoulders.

## 2013-07-27 NOTE — ED Provider Notes (Signed)
Medical screening examination/treatment/procedure(s) were performed by non-physician practitioner and as supervising physician I was immediately available for consultation/collaboration.  EKG Interpretation   None         Shelda Jakes, MD 07/27/13 [phone]

## 2014-06-16 ENCOUNTER — Other Ambulatory Visit (HOSPITAL_COMMUNITY): Payer: Self-pay | Admitting: Family Medicine

## 2014-06-16 DIAGNOSIS — Z1231 Encounter for screening mammogram for malignant neoplasm of breast: Secondary | ICD-10-CM

## 2014-07-11 ENCOUNTER — Ambulatory Visit (HOSPITAL_COMMUNITY)
Admission: RE | Admit: 2014-07-11 | Discharge: 2014-07-11 | Disposition: A | Discharge status: Home / Self Care | Payer: Medicaid Other | Source: Ambulatory Visit | Attending: Family Medicine | Admitting: Family Medicine

## 2014-07-11 DIAGNOSIS — Z1231 Encounter for screening mammogram for malignant neoplasm of breast: Secondary | ICD-10-CM | POA: Insufficient documentation

## 2014-12-22 ENCOUNTER — Other Ambulatory Visit: Payer: Self-pay | Admitting: Surgery

## 2015-06-21 ENCOUNTER — Encounter: Payer: Self-pay | Admitting: Gastroenterology

## 2016-05-14 ENCOUNTER — Other Ambulatory Visit: Payer: Self-pay | Admitting: Family Medicine

## 2016-05-14 DIAGNOSIS — Z1231 Encounter for screening mammogram for malignant neoplasm of breast: Secondary | ICD-10-CM

## 2016-05-15 ENCOUNTER — Ambulatory Visit (INDEPENDENT_AMBULATORY_CARE_PROVIDER_SITE_OTHER): Payer: Self-pay | Admitting: Orthopaedic Surgery

## 2016-05-20 ENCOUNTER — Emergency Department (HOSPITAL_COMMUNITY): Payer: Medicaid Other

## 2016-05-20 ENCOUNTER — Encounter (HOSPITAL_COMMUNITY): Payer: Self-pay | Admitting: Neurology

## 2016-05-20 ENCOUNTER — Emergency Department (HOSPITAL_COMMUNITY)
Admission: EM | Admit: 2016-05-20 | Discharge: 2016-05-20 | Disposition: A | Discharge status: Home / Self Care | Payer: Medicaid Other | Attending: Emergency Medicine | Admitting: Emergency Medicine

## 2016-05-20 DIAGNOSIS — Y999 Unspecified external cause status: Secondary | ICD-10-CM | POA: Diagnosis not present

## 2016-05-20 DIAGNOSIS — R51 Headache: Secondary | ICD-10-CM | POA: Insufficient documentation

## 2016-05-20 DIAGNOSIS — F17200 Nicotine dependence, unspecified, uncomplicated: Secondary | ICD-10-CM | POA: Diagnosis not present

## 2016-05-20 DIAGNOSIS — I10 Essential (primary) hypertension: Secondary | ICD-10-CM | POA: Diagnosis not present

## 2016-05-20 DIAGNOSIS — Y939 Activity, unspecified: Secondary | ICD-10-CM | POA: Insufficient documentation

## 2016-05-20 DIAGNOSIS — R10814 Left lower quadrant abdominal tenderness: Secondary | ICD-10-CM | POA: Insufficient documentation

## 2016-05-20 DIAGNOSIS — S199XXA Unspecified injury of neck, initial encounter: Secondary | ICD-10-CM | POA: Diagnosis not present

## 2016-05-20 DIAGNOSIS — R10816 Epigastric abdominal tenderness: Secondary | ICD-10-CM | POA: Diagnosis not present

## 2016-05-20 DIAGNOSIS — Y92410 Unspecified street and highway as the place of occurrence of the external cause: Secondary | ICD-10-CM | POA: Insufficient documentation

## 2016-05-20 DIAGNOSIS — M25561 Pain in right knee: Secondary | ICD-10-CM | POA: Diagnosis not present

## 2016-05-20 DIAGNOSIS — Z79899 Other long term (current) drug therapy: Secondary | ICD-10-CM | POA: Diagnosis not present

## 2016-05-20 DIAGNOSIS — M25511 Pain in right shoulder: Secondary | ICD-10-CM | POA: Diagnosis not present

## 2016-05-20 DIAGNOSIS — M79631 Pain in right forearm: Secondary | ICD-10-CM | POA: Diagnosis not present

## 2016-05-20 LAB — I-STAT CHEM 8, ED
BUN: 16 mg/dL (ref 6–20)
CALCIUM ION: 1.12 mmol/L — AB (ref 1.15–1.40)
Chloride: 103 mmol/L (ref 101–111)
Creatinine, Ser: 0.9 mg/dL (ref 0.44–1.00)
GLUCOSE: 106 mg/dL — AB (ref 65–99)
HCT: 44 % (ref 36.0–46.0)
HEMOGLOBIN: 15 g/dL (ref 12.0–15.0)
POTASSIUM: 3.8 mmol/L (ref 3.5–5.1)
Sodium: 141 mmol/L (ref 135–145)
TCO2: 26 mmol/L (ref 0–100)

## 2016-05-20 MED ORDER — NAPROXEN 500 MG PO TABS: 500.0000 mg | ORAL_TABLET | Freq: Two times a day (BID) | ORAL | 0 refills | Status: DC

## 2016-05-20 MED ORDER — METHOCARBAMOL 500 MG PO TABS: 500.0000 mg | ORAL_TABLET | Freq: Two times a day (BID) | ORAL | 0 refills | Status: DC

## 2016-05-20 MED ORDER — IBUPROFEN 400 MG PO TABS
600.0000 mg | ORAL_TABLET | Freq: Once | ORAL | Status: AC
  Administered 2016-05-20: 600 mg via ORAL
  Filled 2016-05-20: qty 1

## 2016-05-20 NOTE — ED Notes (Signed)
Gave pt meal bag & juice.
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
## 2016-05-20 NOTE — Discharge Instructions (Signed)
Take your medication as prescribed as he for pain relief. I also recommend applying ice and/or heat to affected area for 15 minutes 3-4 times daily for additional relief. Follow-up with your family doctor in one week if her symptoms have not improved. Return to the emergency department if symptoms worsen or new onset of fever, headache, lightheadedness, dizziness, neck stiffness, chest pain, difficulty breathing, coughing up blood, abdominal pain, vomiting, blood in emesis or stool, numbness, tingling, weakness.

## 2016-05-20 NOTE — ED Notes (Signed)
Pt verbalized understanding of d/c instructions and has no further questions. Pt stable and NAD. Pt d/c home with sign other driving. VSS.
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
## 2016-05-21 ENCOUNTER — Ambulatory Visit: Payer: Medicaid Other

## 2016-07-02 ENCOUNTER — Other Ambulatory Visit: Payer: Self-pay | Admitting: Internal Medicine

## 2016-07-08 ENCOUNTER — Ambulatory Visit (INDEPENDENT_AMBULATORY_CARE_PROVIDER_SITE_OTHER): Payer: Medicaid Other

## 2016-07-08 ENCOUNTER — Ambulatory Visit (INDEPENDENT_AMBULATORY_CARE_PROVIDER_SITE_OTHER): Payer: Medicaid Other | Admitting: Orthopaedic Surgery

## 2016-07-08 ENCOUNTER — Encounter (INDEPENDENT_AMBULATORY_CARE_PROVIDER_SITE_OTHER): Payer: Self-pay | Admitting: Orthopaedic Surgery

## 2016-07-08 VITALS — BP 166/119 | HR 89 | Resp 14 | Ht 66.0 in | Wt 202.0 lb

## 2016-07-08 DIAGNOSIS — G8929 Other chronic pain: Secondary | ICD-10-CM

## 2016-07-08 DIAGNOSIS — M5442 Lumbago with sciatica, left side: Secondary | ICD-10-CM

## 2016-07-08 DIAGNOSIS — M5441 Lumbago with sciatica, right side: Secondary | ICD-10-CM

## 2016-07-08 DIAGNOSIS — M25562 Pain in left knee: Secondary | ICD-10-CM

## 2016-07-08 DIAGNOSIS — M25561 Pain in right knee: Secondary | ICD-10-CM

## 2016-07-08 MED ORDER — LIDOCAINE HCL 1 % IJ SOLN
5.0000 mL | INTRAMUSCULAR | Status: AC | PRN
  Administered 2016-07-08: 5 mL

## 2016-07-08 MED ORDER — IBUPROFEN 800 MG PO TABS: 800.0000 mg | ORAL_TABLET | Freq: Three times a day (TID) | ORAL | 0 refills | Status: DC | PRN

## 2016-07-08 MED ORDER — CYCLOBENZAPRINE HCL 10 MG PO TABS: 10.0000 mg | ORAL_TABLET | Freq: Three times a day (TID) | ORAL | 0 refills | Status: DC | PRN

## 2016-07-08 MED ORDER — METHYLPREDNISOLONE ACETATE 40 MG/ML IJ SUSP
80.0000 mg | INTRAMUSCULAR | Status: AC | PRN
  Administered 2016-07-08: 80 mg

## 2016-07-08 MED ORDER — BUPIVACAINE HCL 0.5 % IJ SOLN
3.0000 mL | INTRAMUSCULAR | Status: AC | PRN
  Administered 2016-07-08: 3 mL via INTRA_ARTICULAR
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
## 2016-07-16 ENCOUNTER — Ambulatory Visit (INDEPENDENT_AMBULATORY_CARE_PROVIDER_SITE_OTHER): Payer: Medicaid Other | Admitting: Orthopedic Surgery

## 2016-07-17 ENCOUNTER — Ambulatory Visit (INDEPENDENT_AMBULATORY_CARE_PROVIDER_SITE_OTHER): Payer: Medicaid Other | Admitting: Orthopaedic Surgery

## 2016-07-18 ENCOUNTER — Ambulatory Visit (INDEPENDENT_AMBULATORY_CARE_PROVIDER_SITE_OTHER): Payer: Medicaid Other | Admitting: Orthopaedic Surgery

## 2016-08-06 ENCOUNTER — Ambulatory Visit (INDEPENDENT_AMBULATORY_CARE_PROVIDER_SITE_OTHER): Payer: Medicaid Other | Admitting: Orthopedic Surgery

## 2016-09-02 ENCOUNTER — Telehealth (INDEPENDENT_AMBULATORY_CARE_PROVIDER_SITE_OTHER): Payer: Self-pay | Admitting: Orthopaedic Surgery

## 2016-09-02 NOTE — Telephone Encounter (Signed)
Patient called very upset because she has not heard anything back about Euflexxa injections and an MRI. I advised patient I would send a message and have someone call her and she was not satisfied with that. She says if she does not hear anything by Friday, she is going to come up to the office and speak to Dr. Durward Fortes personally. I then advised her again I would have someone call her and that Dr.Whitfield will be seeing patients on Friday. Please call patient.

## 2016-09-04 ENCOUNTER — Other Ambulatory Visit (INDEPENDENT_AMBULATORY_CARE_PROVIDER_SITE_OTHER): Payer: Self-pay

## 2016-09-04 DIAGNOSIS — M545 Low back pain: Principal | ICD-10-CM

## 2016-09-04 DIAGNOSIS — G8929 Other chronic pain: Secondary | ICD-10-CM

## 2016-09-04 NOTE — Telephone Encounter (Signed)
Sent referral 

## 2016-09-04 NOTE — Telephone Encounter (Signed)
I called patient, Alison Whitehead is working on MRI authorization, Ivin Booty is working on [employer]. Patient understands we are working on these items and will call her as soon as we have authorization.

## 2016-09-04 NOTE — Telephone Encounter (Signed)
Referral sent 

## 2016-09-10 ENCOUNTER — Other Ambulatory Visit (INDEPENDENT_AMBULATORY_CARE_PROVIDER_SITE_OTHER): Payer: Self-pay | Admitting: Orthopedic Surgery

## 2016-09-10 MED ORDER — DICLOFENAC SODIUM 1 % TD GEL: 2.0000 g | Freq: Four times a day (QID) | TRANSDERMAL | 1 refills | Status: DC

## 2016-09-15 ENCOUNTER — Telehealth (INDEPENDENT_AMBULATORY_CARE_PROVIDER_SITE_OTHER): Payer: Self-pay | Admitting: Orthopaedic Surgery

## 2016-09-15 NOTE — Telephone Encounter (Signed)
Patient called and asked if she could get a refill on her prescription of Ibuprofen 800mg .  Cb#[phone].  Thank you.

## 2016-09-16 ENCOUNTER — Other Ambulatory Visit: Payer: Medicaid Other

## 2016-09-16 NOTE — Telephone Encounter (Signed)
Refill request paper faxed said she had an appt Wednesday 2/14

## 2016-09-23 ENCOUNTER — Other Ambulatory Visit: Payer: Medicaid Other

## 2016-10-06 ENCOUNTER — Ambulatory Visit (INDEPENDENT_AMBULATORY_CARE_PROVIDER_SITE_OTHER): Payer: Medicaid Other | Admitting: Orthopaedic Surgery

## 2016-10-07 ENCOUNTER — Telehealth (INDEPENDENT_AMBULATORY_CARE_PROVIDER_SITE_OTHER): Payer: Self-pay | Admitting: Orthopaedic Surgery

## 2016-10-07 NOTE — Telephone Encounter (Signed)
Returned pt call. She stated she had a panic attack inside the MRI so it could not be done. (pt had one done several years ago so she didn't tell me until further discussion. I called pt and spoke with her about this after checking her EPIC order that specified she was claustrophobic and needed open unit. After explaining how the process works for over 10 minutes, pt informed me she did not even go to the scheduled MRI on 2 separate appointments b/c she "forgot." She stated [facility] did not tell her they were open. I explained that when an order is put in as open, when a pt. is scheduled, pt is   aware at time of order from MD.  I explained she could possibly get a relaxant with MD approval or conscious sedation at hospital.  Pt stated she was late for work and I asked her to call back if she wanted to reschedule.  I also addressed the Euflexxa injection and told her the rx was transferred to [facility] and they have not responded.

## 2016-10-07 NOTE — Telephone Encounter (Signed)
Patient called stating that she could not get her MRI done because she had a panic attack with the closed one.  She is wanting to know if we could find an open MRI machine.  She also wanted to know if you had found out anything with her Euflexxa injections.  Cb#[phone].  Thank you.

## 2016-10-27 ENCOUNTER — Telehealth: Payer: Self-pay | Admitting: Orthopaedic Surgery

## 2016-10-27 NOTE — Telephone Encounter (Signed)
Alison Whitehead w/[facility] called to say the Euflexxa is rejected under the pharmacy, so this is a [place] under Medicaid for [facility] patient

## 2016-11-12 ENCOUNTER — Telehealth (INDEPENDENT_AMBULATORY_CARE_PROVIDER_SITE_OTHER): Payer: Self-pay | Admitting: Orthopaedic Surgery

## 2016-11-12 NOTE — Telephone Encounter (Signed)
Patient called to check the status of her Euflexxa injections.  CB#[phone].

## 2016-11-13 NOTE — Telephone Encounter (Signed)
Spoke to patient and informed her that Medicaid no longer covers the injections. I also provided her with the self pay cost of $555 per injection. Patient was very upset but I explained it was a Medicare policy, not our office policy.

## 2016-12-10 NOTE — Telephone Encounter (Signed)
Medicaid does not cover inj. Patient aware.

## 2017-01-20 ENCOUNTER — Other Ambulatory Visit: Payer: Self-pay | Admitting: Family Medicine

## 2017-01-20 DIAGNOSIS — Z1231 Encounter for screening mammogram for malignant neoplasm of breast: Secondary | ICD-10-CM

## 2017-02-03 ENCOUNTER — Ambulatory Visit: Payer: Medicaid Other

## 2017-04-22 ENCOUNTER — Ambulatory Visit
Admission: RE | Admit: 2017-04-22 | Discharge: 2017-04-22 | Disposition: A | Discharge status: Home / Self Care | Payer: Medicaid Other | Source: Ambulatory Visit | Attending: Family Medicine | Admitting: Family Medicine

## 2017-04-22 DIAGNOSIS — Z1231 Encounter for screening mammogram for malignant neoplasm of breast: Secondary | ICD-10-CM

## 2017-10-13 ENCOUNTER — Ambulatory Visit (INDEPENDENT_AMBULATORY_CARE_PROVIDER_SITE_OTHER): Payer: Self-pay | Admitting: Orthopaedic Surgery

## 2017-10-19 ENCOUNTER — Ambulatory Visit (INDEPENDENT_AMBULATORY_CARE_PROVIDER_SITE_OTHER): Payer: Self-pay | Admitting: Orthopaedic Surgery

## 2017-10-20 ENCOUNTER — Ambulatory Visit (INDEPENDENT_AMBULATORY_CARE_PROVIDER_SITE_OTHER): Payer: Medicare Other | Admitting: Orthopaedic Surgery

## 2017-10-20 ENCOUNTER — Ambulatory Visit (INDEPENDENT_AMBULATORY_CARE_PROVIDER_SITE_OTHER): Payer: Self-pay

## 2017-10-20 ENCOUNTER — Encounter (INDEPENDENT_AMBULATORY_CARE_PROVIDER_SITE_OTHER): Payer: Self-pay | Admitting: Orthopaedic Surgery

## 2017-10-20 VITALS — BP 146/104 | HR 92 | Ht 65.0 in | Wt 190.0 lb

## 2017-10-20 DIAGNOSIS — M1711 Unilateral primary osteoarthritis, right knee: Secondary | ICD-10-CM | POA: Diagnosis not present

## 2017-10-20 DIAGNOSIS — M25562 Pain in left knee: Secondary | ICD-10-CM | POA: Diagnosis not present

## 2017-10-20 DIAGNOSIS — M25561 Pain in right knee: Secondary | ICD-10-CM | POA: Diagnosis not present

## 2017-10-20 DIAGNOSIS — G8929 Other chronic pain: Secondary | ICD-10-CM | POA: Diagnosis not present

## 2017-10-20 DIAGNOSIS — M1712 Unilateral primary osteoarthritis, left knee: Secondary | ICD-10-CM | POA: Diagnosis not present

## 2017-10-20 MED ORDER — BUPIVACAINE HCL 0.5 % IJ SOLN
2.0000 mL | INTRAMUSCULAR | Status: AC | PRN
  Administered 2017-10-20: 2 mL via INTRA_ARTICULAR

## 2017-10-20 MED ORDER — METHYLPREDNISOLONE ACETATE 40 MG/ML IJ SUSP
80.0000 mg | INTRAMUSCULAR | Status: AC | PRN
  Administered 2017-10-20: 80 mg

## 2017-10-20 MED ORDER — LIDOCAINE HCL 1 % IJ SOLN
2.0000 mL | INTRAMUSCULAR | Status: AC | PRN
  Administered 2017-10-20: 2 mL

## 2017-10-20 NOTE — Progress Notes (Signed)
Office Visit Note   Patient: Alison Whitehead           Date of Birth: 06/23/1951           MRN: [id number] Visit Date: 10/20/2017              Requested by: Ricke Hey, MD [place], [place] [address] PCP: Ricke Hey, MD   Assessment & Plan: Visit Diagnoses:  1. Unilateral primary osteoarthritis, right knee   2. Unilateral primary osteoarthritis, left knee   3. Chronic pain of left knee   4. Chronic pain of right knee     Plan:  #1: Steroid injections to both knees. #2: Precertified Euflexxa injections for the future.  Follow-Up Instructions: No Follow-up on file.   Orders:  Orders Placed This Encounter  Procedures  . XR KNEE 3 VIEW LEFT  . XR KNEE 3 VIEW RIGHT   No orders of the defined types were placed in this encounter.     Procedures: Large Joint Inj: bilateral knee on 10/20/2017 5:04 PM Indications: diagnostic evaluation Details: 25 G 1.5 in needle, anteromedial approach  Arthrogram: No  Medications (Right): 2 mL lidocaine 1 %; 2 mL bupivacaine 0.5 %; 80 mg methylPREDNISolone acetate 40 MG/ML Medications (Left): 2 mL lidocaine 1 %; 2 mL bupivacaine 0.5 %; 80 mg methylPREDNISolone acetate 40 MG/ML Procedure, treatment alternatives, risks and benefits explained, specific risks discussed. Consent was given by the patient. Immediately prior to procedure a time out was called to verify the correct patient, procedure, equipment, support staff and site/side marked as required. Patient was prepped and draped in the usual sterile fashion.       Clinical Data: No additional findings.   Subjective: Chief Complaint  Patient presents with  . Follow-up    bil lat knee pain    HPI  Alison Whitehead is a very pleasant 67 year old African-American female who is seen today for bilateral knee pain.  She has had arthritis in the knees for many years.  The last time we had seen her was several years ago.  She returns today to crying of pain  in both knees.  Pain with ambulation.  Her physician Dr. Alyson Ingles apparently has not practicing and was apparently giving her narcotics.  She also comes in today requesting refill her narcotics.  Today she comes in requesting knee injections.  She is certainly having the symptoms.   Review of Systems  Constitutional: Negative for fatigue and fever.  HENT: Negative for ear pain.   Eyes: Negative for pain.  Respiratory: Positive for shortness of breath. Negative for cough.   Cardiovascular: Negative for leg swelling.  Gastrointestinal: Positive for diarrhea. Negative for constipation.  Genitourinary: Negative for dysuria.  Musculoskeletal: Positive for back pain and neck pain.  Skin: Negative for rash.  Neurological: Positive for weakness and numbness. Negative for dizziness, light-headedness and headaches.  Hematological: Bruises/bleeds easily.  Psychiatric/Behavioral: Positive for sleep disturbance.     Objective: Vital Signs: BP (!) 146/104 (BP Location: Left Arm, Patient Position: Sitting, Cuff Size: Normal)   Pulse 92   Ht 5\' 5"  (1.651 m)   Wt 190 lb (86.2 kg)   BMI 31.62 kg/m   Physical Exam  Constitutional: She is oriented to person, place, and time. She appears well-developed and well-nourished.  HENT:  Head: Normocephalic and atraumatic.  Eyes: EOM are normal. Pupils are equal, round, and reactive to light.  Pulmonary/Chest: Effort normal.  Neurological: She is alert and oriented to person, place,  and time.  Skin: Skin is warm and dry.  Psychiatric: She has a normal mood and affect. Her behavior is normal. Judgment and thought content normal.    Ortho Exam  Today she lacks 5 degrees of full extension in both knees.  Flexion to about 100 degrees.  Crepitance patellofemoral and tibiofemoral.  She does have some pseudolaxity with valgus stressing.  Trace effusion.  Calf is supple nontender.  Neurovascular intact distally.  Specialty Comments:  No specialty comments  available.  Imaging: Xr Knee 3 View Right  Result Date: 10/20/2017 Three-view x-ray of the right knee reveals end-stage bone-on-bone medial compartment narrowing and arthritis with translation of the femur medially on the tibia.  She is maintaining a joint space on the AP and the lateral compartment.  Mild patellofemoral arthritis.  Some calcification posteriorly is noted off the tibia and possibly some into the posterior fossa.  She does have some patellofemoral joint space DJD with lateral positioning of the patella.  Periarticular spurs both medially and laterally on the femoral condyles.    PMFS History: Current Outpatient Medications  Medication Sig Dispense Refill  . amLODipine (NORVASC) 10 MG tablet Take 10 mg by mouth daily.    . cetirizine (ZYRTEC) 10 MG tablet Take 10 mg by mouth daily.    . clonazePAM (KLONOPIN) 1 MG tablet     . diclofenac sodium (VOLTAREN) 1 % GEL Apply 2-4 g topically 4 (four) times daily. 500 g 1  . dicyclomine (BENTYL) 20 MG tablet Take 20 mg by mouth every 6 (six) hours.    Marland Kitchen HYDROcodone-acetaminophen (NORCO/VICODIN) 5-325 MG tablet Take 1 tablet by mouth every 6 (six) hours as needed for moderate pain.    Marland Kitchen ibuprofen (ADVIL,MOTRIN) 800 MG tablet Take 1 tablet (800 mg total) by mouth every 8 (eight) hours as needed. 30 tablet 0  . cyclobenzaprine (FLEXERIL) 10 MG tablet Take 1 tablet (10 mg total) by mouth 3 (three) times daily as needed for muscle spasms. (Patient not taking: Reported on 10/20/2017) 30 tablet 0  . loratadine (CLARITIN) 10 MG tablet Take 10 mg by mouth daily.    . meloxicam (MOBIC) 7.5 MG tablet Take 2 tablets (15 mg total) by mouth daily. (Patient not taking: Reported on 07/08/2016) 30 tablet 0  . methocarbamol (ROBAXIN) 500 MG tablet Take 1 tablet (500 mg total) by mouth 2 (two) times daily. (Patient not taking: Reported on 07/08/2016) 20 tablet 0  . naproxen (NAPROSYN) 500 MG tablet Take 1 tablet (500 mg total) by mouth 2 (two) times daily.  (Patient not taking: Reported on 07/08/2016) 30 tablet 0  . olmesartan-hydrochlorothiazide (BENICAR HCT) 40-12.5 MG per tablet Take 1 tablet by mouth daily.    Marland Kitchen oxyCODONE-acetaminophen (PERCOCET/ROXICET) 5-325 MG per tablet Take 1 tablet by mouth every 4 (four) hours as needed for severe pain. (Patient not taking: Reported on 07/08/2016) 13 tablet 0  . promethazine (PHENERGAN) 25 MG tablet Take 1 tablet (25 mg total) by mouth every 6 (six) hours as needed for nausea. (Patient not taking: Reported on 07/08/2016) 12 tablet 0  . solifenacin (VESICARE) 5 MG tablet Take 5 mg by mouth daily.     No current facility-administered medications for this visit.      Patient Active Problem List   Diagnosis Date Noted  . INSOMNIA, CHRONIC 05/19/2007  . SPONDYLOSIS, LUMBAR 05/19/2007  . HYPERTENSION 05/03/2007  . ARTHRITIS, KNEE 05/03/2007   Past Medical History:  Diagnosis Date  . Arthritis   . DDD (degenerative disc  disease)   . Degenerative disc disease, lumbar   . Hypertension   . IBS (irritable bowel syndrome)   . Osteopenia     Family History  Problem Relation Age of Onset  . Breast cancer Maternal Aunt   . Diabetes Mother   . Hypertension Mother   . Glaucoma Mother   . Diabetes Sister   . Hypertension Sister   . Cancer Sister     Past Surgical History:  Procedure Laterality Date  . SHOULDER SURGERY     Social History   Occupational History  . Not on file  Tobacco Use  . Smoking status: Current Every Day Smoker  Substance and Sexual Activity  . Alcohol use: No  . Drug use: No  . Sexual activity: Not on file

## 2017-10-22 DIAGNOSIS — K439 Ventral hernia without obstruction or gangrene: Secondary | ICD-10-CM | POA: Diagnosis not present

## 2017-10-22 DIAGNOSIS — D1722 Benign lipomatous neoplasm of skin and subcutaneous tissue of left arm: Secondary | ICD-10-CM | POA: Diagnosis not present

## 2017-10-29 ENCOUNTER — Other Ambulatory Visit: Payer: Self-pay

## 2017-10-29 ENCOUNTER — Encounter (HOSPITAL_COMMUNITY): Payer: Self-pay | Admitting: Emergency Medicine

## 2017-10-29 ENCOUNTER — Ambulatory Visit (HOSPITAL_COMMUNITY)
Admission: EM | Admit: 2017-10-29 | Discharge: 2017-10-29 | Disposition: A | Discharge status: Home / Self Care | Payer: Medicare Other | Attending: Physician Assistant | Admitting: Physician Assistant

## 2017-10-29 DIAGNOSIS — Z760 Encounter for issue of repeat prescription: Secondary | ICD-10-CM | POA: Diagnosis not present

## 2017-10-29 DIAGNOSIS — G8929 Other chronic pain: Secondary | ICD-10-CM

## 2017-10-29 MED ORDER — HYDROCODONE-ACETAMINOPHEN 5-325 MG PO TABS: 1.0000 | ORAL_TABLET | Freq: Three times a day (TID) | ORAL | 0 refills | Status: DC | PRN

## 2017-10-29 NOTE — ED Triage Notes (Signed)
General body aches over the past 2 months.  Lost her pcp-dr mckenzie.  Requesting refill for pain medicine

## 2017-10-29 NOTE — Discharge Instructions (Signed)
Please give the above pain management clinic a call to help further manage your pain. You may be able to get in with them faster than with a PCP. I have also attached PCP information for you to follow up with.

## 2017-10-29 NOTE — ED Provider Notes (Signed)
[place]    CSN: [id number] Arrival date & time: 10/29/17  1527     History   Chief Complaint Chief Complaint  Patient presents with  . Generalized Body Aches  . Medication Refill    HPI Alison Whitehead is a 67 y.o. female.   67 year old female chronic pain patient comes in for medication refill of norco. PCP is Dr Alyson Ingles, who is not longer in practice. Patient has been taking norco 3x/day for many years. States she supplements with advil when needed. States she has been actively looking for new PCPs, but has not gotten any appointment. States she has pain in her joints for arthritis with spasms.      Past Medical History:  Diagnosis Date  . Arthritis   . DDD (degenerative disc disease)   . Degenerative disc disease, lumbar   . Hypertension   . IBS (irritable bowel syndrome)   . Osteopenia     Patient Active Problem List   Diagnosis Date Noted  . INSOMNIA, CHRONIC 05/19/2007  . SPONDYLOSIS, LUMBAR 05/19/2007  . HYPERTENSION 05/03/2007  . ARTHRITIS, KNEE 05/03/2007    Past Surgical History:  Procedure Laterality Date  . SHOULDER SURGERY      OB History   None      Home Medications    Prior to Admission medications   Medication Sig Start Date End Date Taking? Authorizing Provider  amLODipine (NORVASC) 10 MG tablet Take 10 mg by mouth daily.    [provider]  cetirizine (ZYRTEC) 10 MG tablet Take 10 mg by mouth daily.    [provider]  clonazePAM Bobbye Charleston) 1 MG tablet  09/24/17   [provider]  cyclobenzaprine (FLEXERIL) 10 MG tablet Take 1 tablet (10 mg total) by mouth 3 (three) times daily as needed for muscle spasms. Patient not taking: Reported on 10/20/2017 07/08/16   Biagio Borg D, PA-C  diclofenac sodium (VOLTAREN) 1 % GEL Apply 2-4 g topically 4 (four) times daily. 09/10/16   Cherylann Ratel, PA-C  dicyclomine (BENTYL) 20 MG tablet Take 20 mg by mouth every 6 (six) hours.    [provider]  HYDROcodone-acetaminophen (NORCO/VICODIN) 5-325 MG tablet Take 1 tablet by mouth every 8 (eight) hours as needed for moderate pain or severe pain. 10/29/17   Tasia Catchings, Hiro Vipond V, PA-C  ibuprofen (ADVIL,MOTRIN) 800 MG tablet Take 1 tablet (800 mg total) by mouth every 8 (eight) hours as needed. 07/08/16   Cherylann Ratel, PA-C  loratadine (CLARITIN) 10 MG tablet Take 10 mg by mouth daily.    [provider]  meloxicam (MOBIC) 7.5 MG tablet Take 2 tablets (15 mg total) by mouth daily. Patient not taking: Reported on 07/08/2016 07/22/13   Antonietta Breach, PA-C  methocarbamol (ROBAXIN) 500 MG tablet Take 1 tablet (500 mg total) by mouth 2 (two) times daily. Patient not taking: Reported on 07/08/2016 05/20/16   Nona Dell, PA-C  naproxen (NAPROSYN) 500 MG tablet Take 1 tablet (500 mg total) by mouth 2 (two) times daily. Patient not taking: Reported on 07/08/2016 05/20/16   Nona Dell, PA-C  olmesartan-hydrochlorothiazide (BENICAR HCT) 40-12.5 MG per tablet Take 1 tablet by mouth daily.    [provider]  oxyCODONE-acetaminophen (PERCOCET/ROXICET) 5-325 MG per tablet Take 1 tablet by mouth every 4 (four) hours as needed for severe pain. Patient not taking: Reported on 07/08/2016 07/22/13   Antonietta Breach, PA-C  promethazine (PHENERGAN) 25 MG tablet Take 1 tablet (25 mg total)  by mouth every 6 (six) hours as needed for nausea. Patient not taking: Reported on 07/08/2016 02/25/13   Cleatrice Burke, PA-C  solifenacin (VESICARE) 5 MG tablet Take 5 mg by mouth daily.    [provider]    Family History Family History  Problem Relation Age of Onset  . Breast cancer Maternal Aunt   . Diabetes Mother   . Hypertension Mother   . Glaucoma Mother   . Diabetes Sister   . Hypertension Sister   . Cancer Sister     Social History Social History   Tobacco Use  . Smoking status: Current Every Day Smoker  . Smokeless tobacco: Never Used  Substance Use  Topics  . Alcohol use: No  . Drug use: No     Allergies   Penicillins   Review of Systems Review of Systems  Respiratory: Negative for chest tightness, shortness of breath and wheezing.   Cardiovascular: Negative for chest pain and palpitations.  Neurological: Negative for dizziness, seizures, syncope, weakness and numbness.     Physical Exam Triage Vital Signs ED Triage Vitals  Enc Vitals Group     BP 10/29/17 1623 (!) 167/98     Pulse Rate 10/29/17 1623 87     Resp 10/29/17 1623 18     Temp 10/29/17 1623 98.1 F (36.7 C)     Temp Source 10/29/17 1623 Oral     SpO2 10/29/17 1623 99 %     Weight --      Height --      Head Circumference --      Peak Flow --      Pain Score 10/29/17 1621 10     Pain Loc --      Pain Edu? --      Excl. in [place]? --    No data found.  Updated Vital Signs BP (!) 167/98 (BP Location: Left Arm) Comment: has not had blood pressure medicine today  Pulse 87   Temp 98.1 F (36.7 C) (Oral)   Resp 18   SpO2 99%   Physical Exam  Constitutional: She is oriented to person, place, and time. She appears well-developed and well-nourished. No distress.  HENT:  Head: Normocephalic and atraumatic.  Eyes: Pupils are equal, round, and reactive to light. Conjunctivae are normal.  Pulmonary/Chest: Effort normal. No respiratory distress.  Neurological: She is alert and oriented to person, place, and time.     UC Treatments / Results  Labs (all labs ordered are listed, but only abnormal results are displayed) Labs Reviewed - No data to display  EKG None Radiology No results found.  Procedures Procedures (including critical care time)  Medications Ordered in UC Medications - No data to display   Initial Impression / Assessment and Plan / UC Course  I have reviewed the triage vital signs and the nursing notes.  Pertinent labs & imaging results that were available during my care of the patient were reviewed by me and considered in my  medical decision making (see chart for details).    Given patient has been on chronic narcotic use, recently lost her PCP, will refill 2 weeks of medicine. Provided patient with multiple pain management and PCP information. Patient to contact for further refills and management needed. Patient expresses understanding and agrees to plan.   Final Clinical Impressions(s) / UC Diagnoses   Final diagnoses:  Medication refill    ED Discharge Orders        Ordered    HYDROcodone-acetaminophen (  NORCO/VICODIN) 5-325 MG tablet  Every 8 hours PRN     10/29/17 1650       Controlled Substance Prescriptions [place] Controlled Substance Registry consulted? Yes, I have consulted the [place] Controlled Substances Registry for this patient, and feel the risk/benefit ratio today is favorable for proceeding with this prescription for a controlled substance.   Ok Edwards, PA-C 10/29/17 1655

## 2017-11-25 ENCOUNTER — Telehealth (INDEPENDENT_AMBULATORY_CARE_PROVIDER_SITE_OTHER): Payer: Self-pay | Admitting: Orthopaedic Surgery

## 2017-11-25 ENCOUNTER — Encounter (HOSPITAL_COMMUNITY): Payer: Self-pay | Admitting: Emergency Medicine

## 2017-11-25 ENCOUNTER — Ambulatory Visit (HOSPITAL_COMMUNITY)
Admission: EM | Admit: 2017-11-25 | Discharge: 2017-11-25 | Disposition: A | Discharge status: Home / Self Care | Payer: Medicare Other | Attending: Family Medicine | Admitting: Family Medicine

## 2017-11-25 DIAGNOSIS — I10 Essential (primary) hypertension: Secondary | ICD-10-CM

## 2017-11-25 DIAGNOSIS — G8929 Other chronic pain: Secondary | ICD-10-CM

## 2017-11-25 MED ORDER — AMLODIPINE BESYLATE 10 MG PO TABS: 10.0000 mg | ORAL_TABLET | Freq: Every day | ORAL | 2 refills | Status: DC

## 2017-11-25 MED ORDER — HYDROCODONE-ACETAMINOPHEN 10-325 MG PO TABS: 1.0000 | ORAL_TABLET | Freq: Three times a day (TID) | ORAL | 0 refills | Status: DC | PRN

## 2017-11-25 NOTE — Telephone Encounter (Signed)
Patient called stating she is waiting to hear if her Euflexxa injections have been approved by Medicare.  Patient states she was seen by Dr. Durward Fortes on 10/20/17 who said they would send the request to get the medication approved by her insurance.

## 2017-11-25 NOTE — ED Triage Notes (Signed)
Pt here for refill on meds since losing PCP; pt sts taking BP meds every other day to try and conserve

## 2017-11-25 NOTE — Telephone Encounter (Signed)
I called patient, pending benefits

## 2017-11-25 NOTE — Discharge Instructions (Signed)
Please continue your search to establish care with a new primary care doctor.

## 2017-11-25 NOTE — ED Provider Notes (Signed)
[place]   [id number] 11/25/17 Arrival Time: 0867  ASSESSMENT & PLAN:  1. Other chronic pain   2. Essential hypertension     Meds ordered this encounter  Medications  . HYDROcodone-acetaminophen (NORCO) 10-325 MG tablet    Sig: Take 1 tablet by mouth 3 (three) times daily as needed.    Dispense:  90 tablet    Refill:  0  . amLODipine (NORVASC) 10 MG tablet    Sig: Take 1 tablet (10 mg total) by mouth daily.    Dispense:  30 tablet    Refill:  2   [place] Controlled Substances Registry consulted for this patient. I feel the risk/benefit ratio today is favorable for proceeding with this prescription for a controlled substance. Medication sedation precautions given. Refilled HTN med. She is in the process of looking for a new PCP.  Reviewed expectations re: course of current medical issues. Questions answered. Outlined signs and symptoms indicating need for more acute intervention. Patient verbalized understanding. After Visit Summary given.   SUBJECTIVE: History from: patient. Alison Whitehead is a 67 y.o. female who presents requesting medication refill. No current concerns. Out of amlodipine. Also needs pain medications refilled. Previous Dr Alyson Ingles patient on chronic pain medications for a long time.  Current Outpatient Medications:  .  amLODipine (NORVASC) 10 MG tablet, Take 1 tablet (10 mg total) by mouth daily., Disp: 30 tablet, Rfl: 2 .  cetirizine (ZYRTEC) 10 MG tablet, Take 10 mg by mouth daily., Disp: , Rfl:  .  clonazePAM (KLONOPIN) 1 MG tablet, , Disp: , Rfl:  .  diclofenac sodium (VOLTAREN) 1 % GEL, Apply 2-4 g topically 4 (four) times daily., Disp: 500 g, Rfl: 1 .  dicyclomine (BENTYL) 20 MG tablet, Take 20 mg by mouth every 6 (six) hours., Disp: , Rfl:  .  HYDROcodone-acetaminophen (NORCO) 10-325 MG tablet, Take 1 tablet by mouth 3 (three) times daily as needed., Disp: 90 tablet, Rfl: 0 .  ibuprofen (ADVIL,MOTRIN) 800 MG tablet, Take 1 tablet (800  mg total) by mouth every 8 (eight) hours as needed., Disp: 30 tablet, Rfl: 0 .  loratadine (CLARITIN) 10 MG tablet, Take 10 mg by mouth daily., Disp: , Rfl:  .  olmesartan-hydrochlorothiazide (BENICAR HCT) 40-12.5 MG per tablet, Take 1 tablet by mouth daily., Disp: , Rfl:  .  solifenacin (VESICARE) 5 MG tablet, Take 5 mg by mouth daily., Disp: , Rfl:   ROS: As per HPI.   OBJECTIVE:  Vitals:   11/25/17 1521  BP: (!) 186/127  Pulse: 92  Resp: 18  Temp: 97.9 F (36.6 C)  TempSrc: Oral  SpO2: 96%    General appearance: alert; no distress Eyes: PERRLA; EOMI; conjunctiva normal Lungs: clear to auscultation bilaterally Heart: regular rate and rhythm Back: no CVA tenderness Extremities: no cyanosis or edema; symmetrical with no gross deformities Skin: warm and dry Neurologic: normal gait; normal symmetric reflexes Psychological: alert and cooperative; normal mood and affect   Allergies  Allergen Reactions  . Penicillins Itching    Past Medical History:  Diagnosis Date  . Arthritis   . DDD (degenerative disc disease)   . Degenerative disc disease, lumbar   . Hypertension   . IBS (irritable bowel syndrome)   . Osteopenia    Social History   Socioeconomic History  . Marital status: Widowed    Spouse name: Not on file  . Number of children: Not on file  . Years of education: Not on file  . Highest education level:  Not on file  Occupational History  . Not on file  Social Needs  . Financial resource strain: Not on file  . Food insecurity:    Worry: Not on file    Inability: Not on file  . Transportation needs:    Medical: Not on file    Non-medical: Not on file  Tobacco Use  . Smoking status: Current Every Day Smoker  . Smokeless tobacco: Never Used  Substance and Sexual Activity  . Alcohol use: No  . Drug use: No  . Sexual activity: Not on file  Lifestyle  . Physical activity:    Days per week: Not on file    Minutes per session: Not on file  . Stress: Not  on file  Relationships  . Social connections:    Talks on phone: Not on file    Gets together: Not on file    Attends religious service: Not on file    Active member of club or organization: Not on file    Attends meetings of clubs or organizations: Not on file    Relationship status: Not on file  . Intimate partner violence:    Fear of current or ex partner: Not on file    Emotionally abused: Not on file    Physically abused: Not on file    Forced sexual activity: Not on file  Other Topics Concern  . Not on file  Social History Narrative  . Not on file   Family History  Problem Relation Age of Onset  . Breast cancer Maternal Aunt   . Diabetes Mother   . Hypertension Mother   . Glaucoma Mother   . Diabetes Sister   . Hypertension Sister   . Cancer Sister    Past Surgical History:  Procedure Laterality Date  . SHOULDER SURGERY       Vanessa Kick, MD 11/28/17 1137

## 2017-12-23 ENCOUNTER — Ambulatory Visit (HOSPITAL_COMMUNITY)
Admission: EM | Admit: 2017-12-23 | Discharge: 2017-12-23 | Disposition: A | Discharge status: Home / Self Care | Payer: Medicare Other | Attending: Family Medicine | Admitting: Family Medicine

## 2017-12-23 ENCOUNTER — Encounter (HOSPITAL_COMMUNITY): Payer: Self-pay | Admitting: Family Medicine

## 2017-12-23 DIAGNOSIS — G8929 Other chronic pain: Secondary | ICD-10-CM

## 2017-12-23 DIAGNOSIS — I10 Essential (primary) hypertension: Secondary | ICD-10-CM

## 2017-12-23 DIAGNOSIS — F419 Anxiety disorder, unspecified: Secondary | ICD-10-CM

## 2017-12-23 MED ORDER — CLONAZEPAM 1 MG PO TABS: 1.0000 mg | ORAL_TABLET | Freq: Three times a day (TID) | ORAL | 0 refills | Status: DC | PRN

## 2017-12-23 MED ORDER — HYDROCODONE-ACETAMINOPHEN 10-325 MG PO TABS: 1.0000 | ORAL_TABLET | Freq: Three times a day (TID) | ORAL | 0 refills | Status: DC | PRN

## 2017-12-23 MED ORDER — LISINOPRIL 20 MG PO TABS: 20.0000 mg | ORAL_TABLET | Freq: Every day | ORAL | 1 refills | Status: DC

## 2017-12-23 NOTE — ED Triage Notes (Addendum)
Pt with hx of chronic pain here for refill on pain meds. Hydrocodone 10/325. She hasn't had in a few days, reports hx of muscle spasms and sciatic nerve pain. She is also requesting refill on clonazepam.

## 2017-12-30 NOTE — ED Provider Notes (Signed)
[place]   [id number] 12/23/17 Arrival Time: 3500  ASSESSMENT & PLAN:  1. Other chronic pain   2. Essential hypertension   3. Anxiety     Meds ordered this encounter  Medications  . HYDROcodone-acetaminophen (NORCO) 10-325 MG tablet    Sig: Take 1 tablet by mouth 3 (three) times daily as needed.    Dispense:  90 tablet    Refill:  0  . clonazePAM (KLONOPIN) 1 MG tablet    Sig: Take 1 tablet (1 mg total) by mouth 3 (three) times daily as needed for anxiety.    Dispense:  90 tablet    Refill:  0  . lisinopril (PRINIVIL,ZESTRIL) 20 MG tablet    Sig: Take 1 tablet (20 mg total) by mouth daily.    Dispense:  30 tablet    Refill:  1   I stressed again today the importance of her establishing care with a PCP and with pain management.  Reviewed expectations re: course of current medical issues. Questions answered. Outlined signs and symptoms indicating need for more acute intervention. Patient verbalized understanding. After Visit Summary given.   SUBJECTIVE: History from: patient. Alison Whitehead is a 67 y.o. female who presents requesting medication refill. No current concerns.   Current Outpatient Medications:  .  amLODipine (NORVASC) 10 MG tablet, Take 1 tablet (10 mg total) by mouth daily., Disp: 30 tablet, Rfl: 2 .  cetirizine (ZYRTEC) 10 MG tablet, Take 10 mg by mouth daily., Disp: , Rfl:  .  clonazePAM (KLONOPIN) 1 MG tablet, Take 1 tablet (1 mg total) by mouth 3 (three) times daily as needed for anxiety., Disp: 90 tablet, Rfl: 0 .  diclofenac sodium (VOLTAREN) 1 % GEL, Apply 2-4 g topically 4 (four) times daily., Disp: 500 g, Rfl: 1 .  dicyclomine (BENTYL) 20 MG tablet, Take 20 mg by mouth every 6 (six) hours., Disp: , Rfl:  .  HYDROcodone-acetaminophen (NORCO) 10-325 MG tablet, Take 1 tablet by mouth 3 (three) times daily as needed., Disp: 90 tablet, Rfl: 0 .  ibuprofen (ADVIL,MOTRIN) 800 MG tablet, Take 1 tablet (800 mg total) by mouth every 8  (eight) hours as needed., Disp: 30 tablet, Rfl: 0 .  lisinopril (PRINIVIL,ZESTRIL) 20 MG tablet, Take 1 tablet (20 mg total) by mouth daily., Disp: 30 tablet, Rfl: 1 .  loratadine (CLARITIN) 10 MG tablet, Take 10 mg by mouth daily., Disp: , Rfl:  .  solifenacin (VESICARE) 5 MG tablet, Take 5 mg by mouth daily., Disp: , Rfl:   ROS: As per HPI.   OBJECTIVE:  Vitals:   12/23/17 1216  BP: (!) 177/121  Pulse: 82  Resp: 18  Temp: 98.1 F (36.7 C)  SpO2: 96%    General appearance: alert; no distress Neck: supple  Lungs: clear to auscultation bilaterally Heart: regular rate and rhythm Abdomen: soft, non-tender Extremities: no edema; symmetrical with no gross deformities Skin: warm and dry Neurologic: normal gait; normal symmetric reflexes Psychological: alert and cooperative; normal mood and affect    Allergies  Allergen Reactions  . Penicillins Itching    Past Medical History:  Diagnosis Date  . Arthritis   . DDD (degenerative disc disease)   . Degenerative disc disease, lumbar   . Hypertension   . IBS (irritable bowel syndrome)   . Osteopenia    Social History   Socioeconomic History  . Marital status: Widowed    Spouse name: Not on file  . Number of children: Not on file  . Years  of education: Not on file  . Highest education level: Not on file  Occupational History  . Not on file  Social Needs  . Financial resource strain: Not on file  . Food insecurity:    Worry: Not on file    Inability: Not on file  . Transportation needs:    Medical: Not on file    Non-medical: Not on file  Tobacco Use  . Smoking status: Current Every Day Smoker  . Smokeless tobacco: Never Used  Substance and Sexual Activity  . Alcohol use: No  . Drug use: No  . Sexual activity: Not on file  Lifestyle  . Physical activity:    Days per week: Not on file    Minutes per session: Not on file  . Stress: Not on file  Relationships  . Social connections:    Talks on phone: Not on  file    Gets together: Not on file    Attends religious service: Not on file    Active member of club or organization: Not on file    Attends meetings of clubs or organizations: Not on file    Relationship status: Not on file  . Intimate partner violence:    Fear of current or ex partner: Not on file    Emotionally abused: Not on file    Physically abused: Not on file    Forced sexual activity: Not on file  Other Topics Concern  . Not on file  Social History Narrative  . Not on file   Family History  Problem Relation Age of Onset  . Breast cancer Maternal Aunt   . Diabetes Mother   . Hypertension Mother   . Glaucoma Mother   . Diabetes Sister   . Hypertension Sister   . Cancer Sister    Past Surgical History:  Procedure Laterality Date  . SHOULDER SURGERY       Vanessa Kick, MD 12/30/17 [phone]

## 2018-01-05 ENCOUNTER — Telehealth (INDEPENDENT_AMBULATORY_CARE_PROVIDER_SITE_OTHER): Payer: Self-pay

## 2018-01-05 NOTE — Telephone Encounter (Signed)
Talked with [facility] concerning delivery of SynviscOne for bilateral knee.  Per Arley with [facility] they are needing a consent from the patient to have injection delivered.  Arley called and I called as well and left a VM for patient to return my call.Gilda Crease injection can be delivered on 01/07/18 if patient calls back today and give a consent, otherwise it will be at a later date.  CB# for [facility] is [phone].  Thank You.

## 2018-01-27 ENCOUNTER — Encounter (HOSPITAL_COMMUNITY): Payer: Self-pay

## 2018-01-27 ENCOUNTER — Ambulatory Visit (HOSPITAL_COMMUNITY)
Admission: EM | Admit: 2018-01-27 | Discharge: 2018-01-27 | Disposition: A | Discharge status: Home / Self Care | Payer: Medicare Other | Attending: Family Medicine | Admitting: Family Medicine

## 2018-01-27 DIAGNOSIS — G8929 Other chronic pain: Secondary | ICD-10-CM

## 2018-01-27 MED ORDER — HYDROCODONE-ACETAMINOPHEN 10-325 MG PO TABS: 1.0000 | ORAL_TABLET | Freq: Three times a day (TID) | ORAL | 0 refills | Status: DC | PRN

## 2018-01-27 NOTE — Discharge Instructions (Signed)
Keep your appointment on July 31st with your new primary doctor.

## 2018-01-27 NOTE — ED Provider Notes (Signed)
[place]   [id number] 01/27/18 Arrival Time: 1122  ASSESSMENT & PLAN:  1. Other chronic pain    Reports that she has found a new PCP with her first appointment on March 03, 2018.  Meds ordered this encounter  Medications  . HYDROcodone-acetaminophen (NORCO) 10-325 MG tablet    Sig: Take 1 tablet by mouth 3 (three) times daily as needed.    Dispense:  90 tablet    Refill:  0   She may f/u here until she establishes with her new PCP.  Reviewed expectations re: course of current medical issues. Questions answered. Outlined signs and symptoms indicating need for more acute intervention. Patient verbalized understanding. After Visit Summary given.   SUBJECTIVE: History from: patient. Alison Whitehead is a 67 y.o. female who presents requesting medication refill. No current concerns.  Current Outpatient Medications (Cardiovascular):  .  amLODipine (NORVASC) 10 MG tablet, Take 1 tablet (10 mg total) by mouth daily. Marland Kitchen  lisinopril (PRINIVIL,ZESTRIL) 20 MG tablet, Take 1 tablet (20 mg total) by mouth daily.  Current Outpatient Medications (Respiratory):  .  cetirizine (ZYRTEC) 10 MG tablet, Take 10 mg by mouth daily. Marland Kitchen  loratadine (CLARITIN) 10 MG tablet, Take 10 mg by mouth daily.  Current Outpatient Medications (Analgesics):  .  ibuprofen (ADVIL,MOTRIN) 800 MG tablet, Take 1 tablet (800 mg total) by mouth every 8 (eight) hours as needed. Marland Kitchen  HYDROcodone-acetaminophen (NORCO) 10-325 MG tablet, Take 1 tablet by mouth 3 (three) times daily as needed.  Current Outpatient Medications (Other):  .  clonazePAM (KLONOPIN) 1 MG tablet, Take 1 tablet (1 mg total) by mouth 3 (three) times daily as needed for anxiety. .  diclofenac sodium (VOLTAREN) 1 % GEL, Apply 2-4 g topically 4 (four) times daily. Marland Kitchen  dicyclomine (BENTYL) 20 MG tablet, Take 20 mg by mouth every 6 (six) hours. .  solifenacin (VESICARE) 5 MG tablet, Take 5 mg by mouth daily.  ROS: As per  HPI.   OBJECTIVE:  Vitals:   01/27/18 1143  BP: 132/87  Pulse: 83  Resp: 20  Temp: 98.9 F (37.2 C)  TempSrc: Oral  SpO2: 97%    General appearance: alert; no distress Lungs: clear to auscultation bilaterally Heart: regular rate and rhythm Skin: warm and dry Neurologic: normal gait Psychological: alert and cooperative; normal mood and affect   Allergies  Allergen Reactions  . Penicillins Itching    Past Medical History:  Diagnosis Date  . Arthritis   . DDD (degenerative disc disease)   . Degenerative disc disease, lumbar   . Hypertension   . IBS (irritable bowel syndrome)   . Osteopenia    Social History   Socioeconomic History  . Marital status: Widowed    Spouse name: Not on file  . Number of children: Not on file  . Years of education: Not on file  . Highest education level: Not on file  Occupational History  . Not on file  Social Needs  . Financial resource strain: Not on file  . Food insecurity:    Worry: Not on file    Inability: Not on file  . Transportation needs:    Medical: Not on file    Non-medical: Not on file  Tobacco Use  . Smoking status: Current Every Day Smoker  . Smokeless tobacco: Never Used  Substance and Sexual Activity  . Alcohol use: No  . Drug use: No  . Sexual activity: Not on file  Lifestyle  . Physical activity:  Days per week: Not on file    Minutes per session: Not on file  . Stress: Not on file  Relationships  . Social connections:    Talks on phone: Not on file    Gets together: Not on file    Attends religious service: Not on file    Active member of club or organization: Not on file    Attends meetings of clubs or organizations: Not on file    Relationship status: Not on file  . Intimate partner violence:    Fear of current or ex partner: Not on file    Emotionally abused: Not on file    Physically abused: Not on file    Forced sexual activity: Not on file  Other Topics Concern  . Not on file  Social  History Narrative  . Not on file   Family History  Problem Relation Age of Onset  . Breast cancer Maternal Aunt   . Diabetes Mother   . Hypertension Mother   . Glaucoma Mother   . Diabetes Sister   . Hypertension Sister   . Cancer Sister    Past Surgical History:  Procedure Laterality Date  . SHOULDER SURGERY       Vanessa Kick, MD 01/27/18 1230

## 2018-01-27 NOTE — ED Triage Notes (Signed)
Pt is presenting for medication refill for pain meds.

## 2018-02-15 ENCOUNTER — Telehealth (INDEPENDENT_AMBULATORY_CARE_PROVIDER_SITE_OTHER): Payer: Self-pay | Admitting: Orthopaedic Surgery

## 2018-02-15 NOTE — Telephone Encounter (Signed)
Patient left a voicemail checking the status of her injections.  Patient is requesting a return call.

## 2018-02-24 ENCOUNTER — Other Ambulatory Visit: Payer: Self-pay

## 2018-02-24 ENCOUNTER — Ambulatory Visit (HOSPITAL_COMMUNITY)
Admission: EM | Admit: 2018-02-24 | Discharge: 2018-02-24 | Disposition: A | Discharge status: Home / Self Care | Payer: Medicare Other | Attending: Family Medicine | Admitting: Family Medicine

## 2018-02-24 ENCOUNTER — Encounter (HOSPITAL_COMMUNITY): Payer: Self-pay | Admitting: Emergency Medicine

## 2018-02-24 DIAGNOSIS — I10 Essential (primary) hypertension: Secondary | ICD-10-CM

## 2018-02-24 DIAGNOSIS — G8929 Other chronic pain: Secondary | ICD-10-CM | POA: Diagnosis not present

## 2018-02-24 MED ORDER — HYDROCODONE-ACETAMINOPHEN 10-325 MG PO TABS: 1.0000 | ORAL_TABLET | Freq: Three times a day (TID) | ORAL | 0 refills | Status: DC | PRN

## 2018-02-24 MED ORDER — CLONAZEPAM 1 MG PO TABS: 1.0000 mg | ORAL_TABLET | Freq: Three times a day (TID) | ORAL | 0 refills | Status: DC | PRN

## 2018-02-24 MED ORDER — LISINOPRIL 20 MG PO TABS: 20.0000 mg | ORAL_TABLET | Freq: Every day | ORAL | 1 refills | Status: DC

## 2018-02-24 MED ORDER — AMLODIPINE BESYLATE 10 MG PO TABS: 10.0000 mg | ORAL_TABLET | Freq: Every day | ORAL | 1 refills | Status: DC

## 2018-02-24 NOTE — ED Triage Notes (Signed)
Patient here to see dr hagler

## 2018-02-24 NOTE — Discharge Instructions (Addendum)
Keep your follow up appointment with your new doctor.  As we discussed, this will be the last visit where we will prescribe any controlled substances that you take on a regular basis.

## 2018-02-24 NOTE — ED Provider Notes (Signed)
[place]   [id number] 02/24/18 Arrival Time: 0920  ASSESSMENT & PLAN:  1. Other chronic pain   2. Essential hypertension     Meds ordered this encounter  Medications  . amLODipine (NORVASC) 10 MG tablet    Sig: Take 1 tablet (10 mg total) by mouth daily.    Dispense:  30 tablet    Refill:  1  . clonazePAM (KLONOPIN) 1 MG tablet    Sig: Take 1 tablet (1 mg total) by mouth 3 (three) times daily as needed for anxiety.    Dispense:  90 tablet    Refill:  0  . HYDROcodone-acetaminophen (NORCO) 10-325 MG tablet    Sig: Take 1 tablet by mouth 3 (three) times daily as needed.    Dispense:  90 tablet    Refill:  0  . lisinopril (PRINIVIL,ZESTRIL) 20 MG tablet    Sig: Take 1 tablet (20 mg total) by mouth daily.    Dispense:  30 tablet    Refill:  1   I reminded [name] that this will be the last visit where we will refill any controlled substances.  May f/u otherwise as needed.  Reviewed expectations re: course of current medical issues. Questions answered. Outlined signs and symptoms indicating need for more acute intervention. Patient verbalized understanding. After Visit Summary given.   SUBJECTIVE: History from: patient. Alison Whitehead is a 67 y.o. female who presents requesting medication refill. No current concerns.  No current facility-administered medications for this encounter.   Current Outpatient Medications:  .  amLODipine (NORVASC) 10 MG tablet, Take 1 tablet (10 mg total) by mouth daily., Disp: 30 tablet, Rfl: 1 .  cetirizine (ZYRTEC) 10 MG tablet, Take 10 mg by mouth daily., Disp: , Rfl:  .  clonazePAM (KLONOPIN) 1 MG tablet, Take 1 tablet (1 mg total) by mouth 3 (three) times daily as needed for anxiety., Disp: 90 tablet, Rfl: 0 .  diclofenac sodium (VOLTAREN) 1 % GEL, Apply 2-4 g topically 4 (four) times daily., Disp: 500 g, Rfl: 1 .  dicyclomine (BENTYL) 20 MG tablet, Take 20 mg by mouth every 6 (six) hours., Disp: , Rfl:  .   HYDROcodone-acetaminophen (NORCO) 10-325 MG tablet, Take 1 tablet by mouth 3 (three) times daily as needed., Disp: 90 tablet, Rfl: 0 .  ibuprofen (ADVIL,MOTRIN) 800 MG tablet, Take 1 tablet (800 mg total) by mouth every 8 (eight) hours as needed., Disp: 30 tablet, Rfl: 0 .  lisinopril (PRINIVIL,ZESTRIL) 20 MG tablet, Take 1 tablet (20 mg total) by mouth daily., Disp: 30 tablet, Rfl: 1 .  loratadine (CLARITIN) 10 MG tablet, Take 10 mg by mouth daily., Disp: , Rfl:  .  solifenacin (VESICARE) 5 MG tablet, Take 5 mg by mouth daily., Disp: , Rfl:   ROS: As per HPI.   OBJECTIVE:  Vitals:   02/24/18 0940  BP: (!) 167/108  Pulse: 94  Resp: 18  Temp: 98 F (36.7 C)  TempSrc: Oral  SpO2: 98%    General appearance: alert; no distress Eyes: PERRLA; EOMI; conjunctiva normal Neck: supple  Lungs: clear to auscultation bilaterally Heart: regular rate and rhythm Extremities: no cedema; symmetrical with no gross deformities Skin: warm and dry Neurologic: normal gait; normal symmetric reflexes Psychological: alert and cooperative; normal mood and affect    Allergies  Allergen Reactions  . Penicillins Itching    Past Medical History:  Diagnosis Date  . Arthritis   . DDD (degenerative disc disease)   . Degenerative disc disease,  lumbar   . Hypertension   . IBS (irritable bowel syndrome)   . Osteopenia    Social History   Socioeconomic History  . Marital status: Widowed    Spouse name: Not on file  . Number of children: Not on file  . Years of education: Not on file  . Highest education level: Not on file  Occupational History  . Not on file  Social Needs  . Financial resource strain: Not on file  . Food insecurity:    Worry: Not on file    Inability: Not on file  . Transportation needs:    Medical: Not on file    Non-medical: Not on file  Tobacco Use  . Smoking status: Current Every Day Smoker  . Smokeless tobacco: Never Used  Substance and Sexual Activity  . Alcohol  use: No  . Drug use: No  . Sexual activity: Not on file  Lifestyle  . Physical activity:    Days per week: Not on file    Minutes per session: Not on file  . Stress: Not on file  Relationships  . Social connections:    Talks on phone: Not on file    Gets together: Not on file    Attends religious service: Not on file    Active member of club or organization: Not on file    Attends meetings of clubs or organizations: Not on file    Relationship status: Not on file  . Intimate partner violence:    Fear of current or ex partner: Not on file    Emotionally abused: Not on file    Physically abused: Not on file    Forced sexual activity: Not on file  Other Topics Concern  . Not on file  Social History Narrative  . Not on file   Family History  Problem Relation Age of Onset  . Breast cancer Maternal Aunt   . Diabetes Mother   . Hypertension Mother   . Glaucoma Mother   . Diabetes Sister   . Hypertension Sister   . Cancer Sister    Past Surgical History:  Procedure Laterality Date  . SHOULDER SURGERY       Vanessa Kick, MD 02/24/18 1019

## 2018-03-31 ENCOUNTER — Ambulatory Visit: Payer: Medicare Other | Admitting: Family

## 2018-04-21 ENCOUNTER — Encounter (HOSPITAL_COMMUNITY): Payer: Self-pay | Admitting: Emergency Medicine

## 2018-04-21 ENCOUNTER — Ambulatory Visit (HOSPITAL_COMMUNITY)
Admission: EM | Admit: 2018-04-21 | Discharge: 2018-04-21 | Disposition: A | Discharge status: Home / Self Care | Payer: Medicare Other | Attending: Family Medicine | Admitting: Family Medicine

## 2018-04-21 DIAGNOSIS — G894 Chronic pain syndrome: Secondary | ICD-10-CM

## 2018-04-21 NOTE — ED Triage Notes (Signed)
Pt c/o sciatica pain, requesting pain medicine from dr. Mannie Stabile.

## 2018-04-21 NOTE — ED Notes (Signed)
Bed: UC01 Expected date:  Expected time:  Means of arrival:  Comments: 

## 2018-04-27 NOTE — ED Provider Notes (Signed)
[name]   [id number] 04/21/18 Arrival Time: [place] PLAN:  1. Chronic pain syndrome    Discussed that I cannot refill her chronic pain medications. Offered Rx Tramadol for temporary relief. She reports having trouble getting in with a pain specialist. She declines. May f/u as needed. ED if abrupt worsening.  Follow-up Information    Schedule an appointment as soon as possible for a visit  with Alison Whitehead, [place].   Specialty:  Internal Medicine Contact information: [place] [address] [phone]          Reviewed expectations re: course of current medical issues. Questions answered. Outlined signs and symptoms indicating need for more acute intervention. Patient verbalized understanding. After Visit Summary given.  SUBJECTIVE: History from: patient. Alison Whitehead is a 67 y.o. female who reports continued chronic low back discomfort with sciatic symptoms. Previous patient of Dr. Alyson Ingles. Has run out of her pain medications. Reports flare of symptoms. No change from previous symptoms. Varies in intensity. Requests refill of pain medication.  ROS: As per HPI.   OBJECTIVE:  Vitals:   04/21/18 1513  BP: (!) 190/123  Pulse: 74  Resp: 18  Temp: 98.6 F (37 C)  TempSrc: Oral  SpO2: 95%    General appearance: alert; no distress Extremities: warm and well perfused; symmetrical with no gross deformities; reports poorly localized mild tenderness over lower back; no change from previous exams CV: brisk extremity capillary refill Skin: warm and dry Neurologic: normal gait with cane; normal symmetric reflexes in all extremities; normal sensation in all extremities Psychological: alert and cooperative; normal mood and affect  Allergies  Allergen Reactions  . Penicillins Itching    Past Medical History:  Diagnosis Date  . Arthritis   . DDD (degenerative disc disease)   . Degenerative disc disease, lumbar    . Hypertension   . IBS (irritable bowel syndrome)   . Osteopenia    Social History   Socioeconomic History  . Marital status: Widowed    Spouse name: Not on file  . Number of children: Not on file  . Years of education: Not on file  . Highest education level: Not on file  Occupational History  . Not on file  Social Needs  . Financial resource strain: Not on file  . Food insecurity:    Worry: Not on file    Inability: Not on file  . Transportation needs:    Medical: Not on file    Non-medical: Not on file  Tobacco Use  . Smoking status: Current Every Day Smoker  . Smokeless tobacco: Never Used  Substance and Sexual Activity  . Alcohol use: No  . Drug use: No  . Sexual activity: Not on file  Lifestyle  . Physical activity:    Days per week: Not on file    Minutes per session: Not on file  . Stress: Not on file  Relationships  . Social connections:    Talks on phone: Not on file    Gets together: Not on file    Attends religious service: Not on file    Active member of club or organization: Not on file    Attends meetings of clubs or organizations: Not on file    Relationship status: Not on file  Other Topics Concern  . Not on file  Social History Narrative  . Not on file   Family History  Problem Relation Age of Onset  . Breast cancer Maternal  Aunt   . Diabetes Mother   . Hypertension Mother   . Glaucoma Mother   . Diabetes Sister   . Hypertension Sister   . Cancer Sister    Past Surgical History:  Procedure Laterality Date  . SHOULDER SURGERY        Vanessa Kick, MD 04/27/18 [phone]

## 2018-05-26 ENCOUNTER — Ambulatory Visit: Payer: Medicare Other | Admitting: Family

## 2018-05-26 DIAGNOSIS — Z0289 Encounter for other administrative examinations: Secondary | ICD-10-CM

## 2018-08-12 ENCOUNTER — Telehealth (INDEPENDENT_AMBULATORY_CARE_PROVIDER_SITE_OTHER): Payer: Self-pay | Admitting: Orthopaedic Surgery

## 2018-08-12 NOTE — Telephone Encounter (Signed)
Patient left a message requesting a call back with the name of injections doctor wanted her to have. Her insurance before would not cover injections. Now patient has [employer], and she thinks they will cover injections. Please call to advise.

## 2018-08-12 NOTE — Telephone Encounter (Signed)
LMOM for patient to bring copy of insurance card, then we can apply for Euflexxa

## 2018-08-27 ENCOUNTER — Ambulatory Visit: Payer: Managed Care, Other (non HMO) | Admitting: Podiatry

## 2018-09-30 ENCOUNTER — Telehealth (INDEPENDENT_AMBULATORY_CARE_PROVIDER_SITE_OTHER): Payer: Self-pay | Admitting: Orthopaedic Surgery

## 2018-09-30 NOTE — Telephone Encounter (Signed)
FYI:  Patient called stating she has new insurance with [employer] and she will bring the card into the office once she receives it so we can order her Euflexxa injections.

## 2018-10-29 ENCOUNTER — Other Ambulatory Visit (INDEPENDENT_AMBULATORY_CARE_PROVIDER_SITE_OTHER): Payer: Self-pay | Admitting: Orthopedic Surgery

## 2018-10-29 NOTE — Telephone Encounter (Signed)
Has to be seen before any prescriptions are sent in-can try over the counter advil-same med

## 2018-10-29 NOTE — Telephone Encounter (Signed)
Patient was last prescribed this in 2017 by Aaron Edelman. Please advise.

## 2018-10-29 NOTE — Telephone Encounter (Signed)
Patient will call to schedule appt

## 2018-12-02 ENCOUNTER — Telehealth: Payer: Self-pay | Admitting: Orthopaedic Surgery

## 2018-12-02 NOTE — Telephone Encounter (Signed)
Noted  

## 2018-12-02 NOTE — Telephone Encounter (Signed)
Please apply. Thank you.  

## 2018-12-02 NOTE — Telephone Encounter (Signed)
Patient wants to have Visco injections for bil knees. Please send order.

## 2018-12-06 ENCOUNTER — Telehealth: Payer: Self-pay

## 2018-12-06 NOTE — Telephone Encounter (Signed)
Submitted VOB for Visco-3, bilateral knee.

## 2018-12-07 ENCOUNTER — Telehealth: Payer: Self-pay

## 2018-12-07 NOTE — Telephone Encounter (Signed)
PA required for Visco-3, bilateral knee.

## 2018-12-10 ENCOUNTER — Telehealth: Payer: Self-pay

## 2018-12-10 NOTE — Telephone Encounter (Signed)
Please schedule patient an appointment with Dr. Durward Fortes for gel injection.  Thank you.  Patient is approved for Visco-3, bilateral knee. [place] Patient will be responsible for 20% OOP. No Co-pay PA required PA Approval# [id number] Valid 12/07/2018- 03/09/2019

## 2018-12-15 NOTE — Telephone Encounter (Signed)
Called patient to schedule Visco injections.  Patient states she is concerned about the 20% OOP.  Patient states she will call back after she calls her insurance company.

## 2019-02-03 MED FILL — AMLODIPINE-BENAZEPRIL 2.5-1: 2.5-10 | 30 days supply | Qty: 30 | Fill #0

## 2019-02-03 MED FILL — LISINOPRIL 20 MG TABLET: 20 | 30 days supply | Qty: 30 | Fill #0

## 2019-02-03 MED FILL — HYDROCODON-APAP 10-325: 10-325 | 7 days supply | Qty: 21 | Fill #0

## 2019-02-11 MED FILL — HYDROCODON-APAP 10-325: 10-325 | 7 days supply | Qty: 21 | Fill #1

## 2019-03-03 ENCOUNTER — Ambulatory Visit: Payer: Medicare HMO | Admitting: Orthopaedic Surgery

## 2019-03-04 MED FILL — AMLODIPINE-BENAZEPRIL 2.5-1: 2.5-10 | 90 days supply | Qty: 90 | Fill #0

## 2019-03-10 ENCOUNTER — Other Ambulatory Visit: Payer: Self-pay

## 2019-03-10 ENCOUNTER — Encounter: Payer: Self-pay | Admitting: Orthopaedic Surgery

## 2019-03-10 ENCOUNTER — Ambulatory Visit (INDEPENDENT_AMBULATORY_CARE_PROVIDER_SITE_OTHER): Payer: Medicare HMO | Admitting: Orthopaedic Surgery

## 2019-03-10 DIAGNOSIS — M170 Bilateral primary osteoarthritis of knee: Secondary | ICD-10-CM | POA: Insufficient documentation

## 2019-03-10 NOTE — Progress Notes (Signed)
   Office Visit Note   Patient: Alison Whitehead           Date of Birth: [birth year]           MRN: [id number] Visit Date: 03/10/2019              Requested by: No referring provider defined for this encounter. PCP: Patient, No Pcp Per   Assessment & Plan: Visit Diagnoses:  1. Bilateral primary osteoarthritis of knee     Plan: Has been approved for Visco supplementation and specifically, Visco-3 will initiate first injection today both knees and return weekly for the next 2 weeks to complete the series  Follow-Up Instructions: Return in about 1 week (around 03/17/2019).   Orders:  No orders of the defined types were placed in this encounter.  No orders of the defined types were placed in this encounter.     Procedures: Large Joint Inj: bilateral knee on 03/10/2019 2:25 PM Indications: pain and joint swelling Details: 25 G 1.5 in needle, anteromedial approach  Arthrogram: No  Outcome: tolerated well, no immediate complications  Visco-3 i.e. sodium hyaluronate 25 mg 2.5 mL injected into both knees without problem Procedure, treatment alternatives, risks and benefits explained, specific risks discussed. Consent was given by the patient. Immediately prior to procedure a time out was called to verify the correct patient, procedure, equipment, support staff and site/side marked as required. Patient was prepped and draped in the usual sterile fashion.       Clinical Data: No additional findings.   Subjective: Chief Complaint  Patient presents with  . Left Knee - Pain    Visco--3 started 03/10/2019  . Right Knee - Pain    Visco--3 started 03/10/2019  Patient presents today for bilateral visco-3 injections. This is her first injections of the series.  HPI  Review of Systems   Objective: Vital Signs: Ht 5\' 5"  (1.651 m)   Wt 190 lb (86.2 kg)   BMI 31.62 kg/m   Physical Exam  Ortho Exam knees were not hot warm or red.  No effusion.  Specialty Comments:  No  specialty comments available.  Imaging: No results found.   PMFS History: Patient Active Problem List   Diagnosis Date Noted  . Bilateral primary osteoarthritis of knee 03/10/2019  . INSOMNIA, CHRONIC 05/19/2007  . SPONDYLOSIS, LUMBAR 05/19/2007  . HYPERTENSION 05/03/2007  . ARTHRITIS, KNEE 05/03/2007   Past Medical History:  Diagnosis Date  . Arthritis   . DDD (degenerative disc disease)   . Degenerative disc disease, lumbar   . Hypertension   . IBS (irritable bowel syndrome)   . Osteopenia     Family History  Problem Relation Age of Onset  . Breast cancer Maternal Aunt   . Diabetes Mother   . Hypertension Mother   . Glaucoma Mother   . Diabetes Sister   . Hypertension Sister   . Cancer Sister     Past Surgical History:  Procedure Laterality Date  . SHOULDER SURGERY     Social History   Occupational History  . Not on file  Tobacco Use  . Smoking status: Current Every Day Smoker  . Smokeless tobacco: Never Used  Substance and Sexual Activity  . Alcohol use: No  . Drug use: No  . Sexual activity: Not on file

## 2019-03-17 ENCOUNTER — Other Ambulatory Visit: Payer: Self-pay

## 2019-03-17 ENCOUNTER — Ambulatory Visit (INDEPENDENT_AMBULATORY_CARE_PROVIDER_SITE_OTHER): Payer: Medicare HMO | Admitting: Orthopaedic Surgery

## 2019-03-17 ENCOUNTER — Encounter: Payer: Self-pay | Admitting: Orthopaedic Surgery

## 2019-03-17 VITALS — BP 185/105 | HR 81 | Ht 65.0 in | Wt 190.0 lb

## 2019-03-17 DIAGNOSIS — M1711 Unilateral primary osteoarthritis, right knee: Secondary | ICD-10-CM

## 2019-03-17 DIAGNOSIS — M1712 Unilateral primary osteoarthritis, left knee: Secondary | ICD-10-CM | POA: Diagnosis not present

## 2019-03-17 MED ORDER — LIDOCAINE HCL 1 % IJ SOLN
2.0000 mL | INTRAMUSCULAR | Status: AC | PRN
  Administered 2019-03-17: 2 mL

## 2019-03-17 NOTE — Progress Notes (Signed)
Office Visit Note   Patient: Alison Whitehead           Date of Birth: 10-Sep-1950           MRN: [id number] Visit Date: 03/17/2019              Requested by: No referring provider defined for this encounter. PCP: Patient, No Pcp Per   Assessment & Plan: Visit Diagnoses:  1. Unilateral primary osteoarthritis, left knee   2. Unilateral primary osteoarthritis, right knee     Plan:  #1: Bilateral Visco-3 injections were given today without difficulty. #2: Follow back up in 1 week for her final set of injections.  Follow-Up Instructions: No follow-ups on file.   Orders:  No orders of the defined types were placed in this encounter.  No orders of the defined types were placed in this encounter.     Procedures: Large Joint Inj: bilateral knee on 03/17/2019 2:54 PM Indications: pain and joint swelling Details: 25 G 1.5 in needle, anteromedial approach  Arthrogram: No  Medications (Right): 2 mL lidocaine 1 % Medications (Left): 2 mL lidocaine 1 % Outcome: tolerated well, no immediate complications  VISCO-3 INJECTED BILATERALLY   LOT [id number]  EXP DATE 08/03/2021 Procedure, treatment alternatives, risks and benefits explained, specific risks discussed. Consent was given by the patient. Immediately prior to procedure a time out was called to verify the correct patient, procedure, equipment, support staff and site/side marked as required. Patient was prepped and draped in the usual sterile fashion.       Clinical Data: No additional findings.   Subjective: Chief Complaint  Patient presents with  . Left Knee - Follow-up    Visco-3 started on 03/10/2019  . Right Knee - Follow-up    Visco-3 started 03/10/2019  Patient presents today for second Visco-3 injections bilaterally. She started the injections on 03/10/2019. She said that she has not noticed improvement yet.   HPI  Review of Systems  Constitutional: Negative for fatigue.  HENT: Negative for ear pain.   Eyes:  Negative for pain.  Respiratory: Negative for shortness of breath.   Cardiovascular: Negative for leg swelling.  Gastrointestinal: Positive for constipation. Negative for diarrhea.  Endocrine: Negative for cold intolerance and heat intolerance.  Genitourinary: Negative for difficulty urinating.  Musculoskeletal: Negative for joint swelling.  Skin: Negative for rash.  Allergic/Immunologic: Negative for food allergies.  Neurological: Negative for weakness.  Hematological: Does not bruise/bleed easily.  Psychiatric/Behavioral: Negative for sleep disturbance.     Objective: Vital Signs: BP (!) 185/105   Pulse 81   Ht 5\' 5"  (1.651 m)   Wt 190 lb (86.2 kg)   BMI 31.62 kg/m   Physical Exam  Ortho Exam  Bilateral mild effusions.  No warmth or erythema.  Specialty Comments:  No specialty comments available.  Imaging: No results found.   PMFS History: Current Outpatient Medications  Medication Sig Dispense Refill  . amLODipine (NORVASC) 10 MG tablet Take 1 tablet (10 mg total) by mouth daily. 30 tablet 1  . cetirizine (ZYRTEC) 10 MG tablet Take 10 mg by mouth daily.    . clonazePAM (KLONOPIN) 1 MG tablet Take 1 tablet (1 mg total) by mouth 3 (three) times daily as needed for anxiety. 90 tablet 0  . diclofenac sodium (VOLTAREN) 1 % GEL Apply 2-4 g topically 4 (four) times daily. 500 g 1  . dicyclomine (BENTYL) 20 MG tablet Take 20 mg by mouth every 6 (six) hours.    [place] [facility] HYDROcodone-acetaminophen (  NORCO) 10-325 MG tablet Take 1 tablet by mouth 3 (three) times daily as needed. 90 tablet 0  . ibuprofen (ADVIL,MOTRIN) 800 MG tablet Take 1 tablet (800 mg total) by mouth every 8 (eight) hours as needed. 30 tablet 0  . lisinopril (PRINIVIL,ZESTRIL) 20 MG tablet Take 1 tablet (20 mg total) by mouth daily. 30 tablet 1  . loratadine (CLARITIN) 10 MG tablet Take 10 mg by mouth daily.    . solifenacin (VESICARE) 5 MG tablet Take 5 mg by mouth daily.     No current facility-administered  medications for this visit.     Patient Active Problem List   Diagnosis Date Noted  . Unilateral primary osteoarthritis, left knee 03/17/2019  . Unilateral primary osteoarthritis, right knee 03/17/2019  . Bilateral primary osteoarthritis of knee 03/10/2019  . INSOMNIA, CHRONIC 05/19/2007  . SPONDYLOSIS, LUMBAR 05/19/2007  . HYPERTENSION 05/03/2007  . ARTHRITIS, KNEE 05/03/2007   Past Medical History:  Diagnosis Date  . Arthritis   . DDD (degenerative disc disease)   . Degenerative disc disease, lumbar   . Hypertension   . IBS (irritable bowel syndrome)   . Osteopenia     Family History  Problem Relation Age of Onset  . Breast cancer Maternal Aunt   . Diabetes Mother   . Hypertension Mother   . Glaucoma Mother   . Diabetes Sister   . Hypertension Sister   . Cancer Sister     Past Surgical History:  Procedure Laterality Date  . SHOULDER SURGERY     Social History   Occupational History  . Not on file  Tobacco Use  . Smoking status: Current Every Day Smoker  . Smokeless tobacco: Never Used  Substance and Sexual Activity  . Alcohol use: No  . Drug use: No  . Sexual activity: Not on file

## 2019-03-24 ENCOUNTER — Encounter: Payer: Self-pay | Admitting: Orthopaedic Surgery

## 2019-03-24 ENCOUNTER — Other Ambulatory Visit: Payer: Self-pay

## 2019-03-24 ENCOUNTER — Ambulatory Visit (INDEPENDENT_AMBULATORY_CARE_PROVIDER_SITE_OTHER): Payer: Medicare HMO | Admitting: Orthopaedic Surgery

## 2019-03-24 ENCOUNTER — Ambulatory Visit (INDEPENDENT_AMBULATORY_CARE_PROVIDER_SITE_OTHER): Payer: Medicare HMO

## 2019-03-24 DIAGNOSIS — G8929 Other chronic pain: Secondary | ICD-10-CM | POA: Diagnosis not present

## 2019-03-24 DIAGNOSIS — M1712 Unilateral primary osteoarthritis, left knee: Secondary | ICD-10-CM | POA: Diagnosis not present

## 2019-03-24 DIAGNOSIS — M25561 Pain in right knee: Secondary | ICD-10-CM | POA: Diagnosis not present

## 2019-03-24 DIAGNOSIS — M1711 Unilateral primary osteoarthritis, right knee: Secondary | ICD-10-CM | POA: Diagnosis not present

## 2019-03-24 MED ORDER — SODIUM HYALURONATE (VISCOSUP) 20 MG/2ML IX SOSY
20.0000 mg | PREFILLED_SYRINGE | INTRA_ARTICULAR | Status: AC | PRN
  Administered 2019-03-24: 20 mg via INTRA_ARTICULAR

## 2019-03-24 MED ORDER — LIDOCAINE HCL 1 % IJ SOLN
2.0000 mL | INTRAMUSCULAR | Status: AC | PRN
  Administered 2019-03-24: 2 mL

## 2019-03-24 NOTE — Progress Notes (Signed)
Office Visit Note   Patient: Alison Whitehead           Date of Birth: 1950-11-14           MRN: [id number] Visit Date: 03/24/2019              Requested by: No referring provider defined for this encounter. PCP: Patient, No Pcp Per   Assessment & Plan: Visit Diagnoses:  1. Chronic pain of right knee   2. Unilateral primary osteoarthritis, left knee   3. Unilateral primary osteoarthritis, right knee     Plan:  #1: Both knees were injected with Visco-3 without difficulty.  Tolerated procedure well. #2: She will continue to do her walking and if this is not beneficial then possibly a total joint replacement would be indicated.   Follow-Up Instructions: No follow-ups on file.   Orders:  Orders Placed This Encounter  Procedures  . XR KNEE 3 VIEW RIGHT   No orders of the defined types were placed in this encounter.     Procedures:  Large Joint Inj: bilateral knee on 03/24/2019 5:02 PM Indications: pain and joint swelling Details: 25 G 1.5 in needle, anteromedial approach  Arthrogram: No  Medications (Right): 2 mL lidocaine 1 %; 20 mg Sodium Hyaluronate 20 MG/2ML Medications (Left): 2 mL lidocaine 1 %; 20 mg Sodium Hyaluronate 20 MG/2ML Outcome: tolerated well, no immediate complications Procedure, treatment alternatives, risks and benefits explained, specific risks discussed. Consent was given by the patient. Immediately prior to procedure a time out was called to verify the correct patient, procedure, equipment, support staff and site/side marked as required. Patient was prepped and draped in the usual sterile fashion.       Clinical Data: No additional findings.   Subjective: Chief Complaint  Patient presents with  . Right Knee - Pain  HPI Alison Whitehead is a 68 year old female who presents with bilateral knee pain. She is here for her 3rd Visco-3 injection, buy and bill. She is having severe right knee pain. She is having difficulty walking. She is  having difficulty walking and swelling. She is requesting a work note to be able to use her cane at work.   Review of Systems  Constitutional: Negative for fatigue.  HENT: Negative for ear pain.   Eyes: Negative for pain.  Respiratory: Negative for shortness of breath.   Cardiovascular: Negative for leg swelling.  Gastrointestinal: Positive for constipation. Negative for diarrhea.  Endocrine: Negative for cold intolerance and heat intolerance.  Genitourinary: Negative for difficulty urinating.  Musculoskeletal: Negative for joint swelling.  Skin: Negative for rash.  Allergic/Immunologic: Negative for food allergies.  Neurological: Negative for weakness.  Hematological: Does not bruise/bleed easily.  Psychiatric/Behavioral: Negative for sleep disturbance.     Objective: Vital Signs: There were no vitals taken for this visit.  Physical Exam Constitutional:      Appearance: She is well-developed.  Eyes:     Pupils: Pupils are equal, round, and reactive to light.  Pulmonary:     Effort: Pulmonary effort is normal.  Skin:    General: Skin is warm and dry.  Neurological:     Mental Status: She is alert and oriented to person, place, and time.  Psychiatric:        Behavior: Behavior normal.     Ortho Exam  Bilateral mild effusions.  No warmth or erythema   Specialty Comments:  No specialty comments available.  Imaging: No results found.   PMFS History: Patient Active Problem  List   Diagnosis Date Noted  . Unilateral primary osteoarthritis, left knee 03/17/2019  . Unilateral primary osteoarthritis, right knee 03/17/2019  . Bilateral primary osteoarthritis of knee 03/10/2019  . INSOMNIA, CHRONIC 05/19/2007  . SPONDYLOSIS, LUMBAR 05/19/2007  . HYPERTENSION 05/03/2007  . ARTHRITIS, KNEE 05/03/2007   Past Medical History:  Diagnosis Date  . Arthritis   . DDD (degenerative disc disease)   . Degenerative disc disease, lumbar   . Hypertension   . IBS (irritable  bowel syndrome)   . Osteopenia     Family History  Problem Relation Age of Onset  . Breast cancer Maternal Aunt   . Diabetes Mother   . Hypertension Mother   . Glaucoma Mother   . Diabetes Sister   . Hypertension Sister   . Cancer Sister     Past Surgical History:  Procedure Laterality Date  . SHOULDER SURGERY     Social History   Occupational History  . Not on file  Tobacco Use  . Smoking status: Current Every Day Smoker  . Smokeless tobacco: Never Used  Substance and Sexual Activity  . Alcohol use: No  . Drug use: No  . Sexual activity: Not on file

## 2019-04-26 ENCOUNTER — Encounter: Payer: Self-pay | Admitting: Orthopaedic Surgery

## 2019-04-26 ENCOUNTER — Ambulatory Visit (INDEPENDENT_AMBULATORY_CARE_PROVIDER_SITE_OTHER): Payer: Medicare HMO | Admitting: Orthopaedic Surgery

## 2019-04-26 ENCOUNTER — Other Ambulatory Visit: Payer: Self-pay

## 2019-04-26 VITALS — BP 167/120 | HR 83 | Ht 65.0 in | Wt 190.0 lb

## 2019-04-26 DIAGNOSIS — M1712 Unilateral primary osteoarthritis, left knee: Secondary | ICD-10-CM | POA: Diagnosis not present

## 2019-04-26 MED ORDER — METHYLPREDNISOLONE ACETATE 40 MG/ML IJ SUSP
80.0000 mg | INTRAMUSCULAR | Status: AC | PRN
  Administered 2019-04-26: 80 mg via INTRA_ARTICULAR

## 2019-04-26 MED ORDER — LIDOCAINE HCL 1 % IJ SOLN
2.0000 mL | INTRAMUSCULAR | Status: AC | PRN
  Administered 2019-04-26: 2 mL

## 2019-04-26 MED ORDER — BUPIVACAINE HCL 0.5 % IJ SOLN
2.0000 mL | INTRAMUSCULAR | Status: AC | PRN
  Administered 2019-04-26: 2 mL via INTRA_ARTICULAR

## 2019-04-26 NOTE — Progress Notes (Signed)
Office Visit Note   Patient: Alison Whitehead           Date of Birth: May 16, 1951           MRN: [id number] Visit Date: 04/26/2019              Requested by: No referring provider defined for this encounter. PCP: Patient, No Pcp Per   Assessment & Plan: Visit Diagnoses:  1. Unilateral primary osteoarthritis, left knee     Plan: Recurrent pain and effusion left knee.  We will aspirate and inject cortisone.  I believe all of her symptoms are related to the arthritis.  We will also give her a note to return to work next week.  Continue with the pullover knee support.  Have discussed the fact that knee replacement is the definitive procedure for her problem  Follow-Up Instructions: Return if symptoms worsen or fail to improve.   Orders:  Orders Placed This Encounter  Procedures  . Large Joint Inj: L knee   No orders of the defined types were placed in this encounter.     Procedures: Large Joint Inj: L knee on 04/26/2019 11:35 AM Indications: pain and diagnostic evaluation Details: 25 G 1.5 in needle, anteromedial approach  Arthrogram: No  Medications: 2 mL lidocaine 1 %; 2 mL bupivacaine 0.5 %; 80 mg methylPREDNISolone acetate 40 MG/ML Aspirate: 30 mL clear and yellow Procedure, treatment alternatives, risks and benefits explained, specific risks discussed. Consent was given by the patient. Patient was prepped and draped in the usual sterile fashion.       Clinical Data: No additional findings.   Subjective: Chief Complaint  Patient presents with  . Left Knee - Pain  Patient presents today for recurrent left knee pain. She finished the Visco-3 injections bilaterally on 03/24/2019. The injections made her knees feel better. She was at work last Tuesday and her knee popped upon getting up from a chair. Her pain is located posteriorly and on the superior lateral side. She is not taking anything for pain. She is walking with the assistance of a cane, but her job will  not allow her to use a cane. Her pain is constant now since the pop she felt.  Prior films are consistent with osteoarthritis of both knees.  HPI  Review of Systems   Objective: Vital Signs: BP (!) 167/120   Pulse 83   Ht 5\' 5"  (1.651 m)   Wt 190 lb (86.2 kg)   BMI 31.62 kg/m   Physical Exam Constitutional:      Appearance: She is well-developed.  Eyes:     Pupils: Pupils are equal, round, and reactive to light.  Pulmonary:     Effort: Pulmonary effort is normal.  Skin:    General: Skin is warm and dry.  Neurological:     Mental Status: She is alert and oriented to person, place, and time.  Psychiatric:        Behavior: Behavior normal.     Ortho Exam awake alert and oriented x3.  Comfortable sitting.  Positive effusion left knee with pain on flexion extension.  Mostly lateral joint pain.  No distal edema.  Neurologically intact.  Straight leg raise.  Specialty Comments:  No specialty comments available.  Imaging: No results found.   PMFS History: Patient Active Problem List   Diagnosis Date Noted  . Unilateral primary osteoarthritis, left knee 03/17/2019  . Unilateral primary osteoarthritis, right knee 03/17/2019  . Bilateral primary osteoarthritis of knee 03/10/2019  .  INSOMNIA, CHRONIC 05/19/2007  . SPONDYLOSIS, LUMBAR 05/19/2007  . HYPERTENSION 05/03/2007  . ARTHRITIS, KNEE 05/03/2007   Past Medical History:  Diagnosis Date  . Arthritis   . DDD (degenerative disc disease)   . Degenerative disc disease, lumbar   . Hypertension   . IBS (irritable bowel syndrome)   . Osteopenia     Family History  Problem Relation Age of Onset  . Breast cancer Maternal Aunt   . Diabetes Mother   . Hypertension Mother   . Glaucoma Mother   . Diabetes Sister   . Hypertension Sister   . Cancer Sister     Past Surgical History:  Procedure Laterality Date  . SHOULDER SURGERY     Social History   Occupational History  . Not on file  Tobacco Use  . Smoking  status: Current Every Day Smoker  . Smokeless tobacco: Never Used  Substance and Sexual Activity  . Alcohol use: No  . Drug use: No  . Sexual activity: Not on file

## 2019-09-05 DIAGNOSIS — I639 Cerebral infarction, unspecified: Secondary | ICD-10-CM

## 2019-09-05 HISTORY — DX: Cerebral infarction, unspecified: I63.9

## 2019-09-30 ENCOUNTER — Emergency Department (HOSPITAL_COMMUNITY): Payer: Medicare HMO

## 2019-09-30 ENCOUNTER — Inpatient Hospital Stay (HOSPITAL_COMMUNITY)
Admission: EM | Admit: 2019-09-30 | Discharge: 2019-10-06 | DRG: 065 | Disposition: A | Discharge status: Rehabilitation Facility | Payer: Medicare HMO | Attending: Internal Medicine | Admitting: Internal Medicine

## 2019-09-30 ENCOUNTER — Encounter (HOSPITAL_COMMUNITY): Payer: Self-pay | Admitting: Emergency Medicine

## 2019-09-30 DIAGNOSIS — R29701 NIHSS score 1: Secondary | ICD-10-CM | POA: Diagnosis present

## 2019-09-30 DIAGNOSIS — R7989 Other specified abnormal findings of blood chemistry: Secondary | ICD-10-CM | POA: Diagnosis not present

## 2019-09-30 DIAGNOSIS — Z6828 Body mass index (BMI) 28.0-28.9, adult: Secondary | ICD-10-CM

## 2019-09-30 DIAGNOSIS — R42 Dizziness and giddiness: Secondary | ICD-10-CM | POA: Diagnosis not present

## 2019-09-30 DIAGNOSIS — Z8249 Family history of ischemic heart disease and other diseases of the circulatory system: Secondary | ICD-10-CM | POA: Diagnosis not present

## 2019-09-30 DIAGNOSIS — I77810 Thoracic aortic ectasia: Secondary | ICD-10-CM | POA: Diagnosis present

## 2019-09-30 DIAGNOSIS — Z20822 Contact with and (suspected) exposure to covid-19: Secondary | ICD-10-CM | POA: Diagnosis present

## 2019-09-30 DIAGNOSIS — I110 Hypertensive heart disease with heart failure: Secondary | ICD-10-CM | POA: Diagnosis present

## 2019-09-30 DIAGNOSIS — I7389 Other specified peripheral vascular diseases: Secondary | ICD-10-CM | POA: Diagnosis present

## 2019-09-30 DIAGNOSIS — D259 Leiomyoma of uterus, unspecified: Secondary | ICD-10-CM | POA: Diagnosis present

## 2019-09-30 DIAGNOSIS — I629 Nontraumatic intracranial hemorrhage, unspecified: Secondary | ICD-10-CM | POA: Diagnosis not present

## 2019-09-30 DIAGNOSIS — E669 Obesity, unspecified: Secondary | ICD-10-CM | POA: Diagnosis present

## 2019-09-30 DIAGNOSIS — E785 Hyperlipidemia, unspecified: Secondary | ICD-10-CM | POA: Diagnosis present

## 2019-09-30 DIAGNOSIS — M5136 Other intervertebral disc degeneration, lumbar region: Secondary | ICD-10-CM | POA: Diagnosis present

## 2019-09-30 DIAGNOSIS — H5500 Unspecified nystagmus: Secondary | ICD-10-CM | POA: Diagnosis present

## 2019-09-30 DIAGNOSIS — I639 Cerebral infarction, unspecified: Secondary | ICD-10-CM | POA: Diagnosis not present

## 2019-09-30 DIAGNOSIS — K589 Irritable bowel syndrome without diarrhea: Secondary | ICD-10-CM | POA: Diagnosis present

## 2019-09-30 DIAGNOSIS — I6523 Occlusion and stenosis of bilateral carotid arteries: Secondary | ICD-10-CM | POA: Diagnosis present

## 2019-09-30 DIAGNOSIS — Z79891 Long term (current) use of opiate analgesic: Secondary | ICD-10-CM | POA: Diagnosis not present

## 2019-09-30 DIAGNOSIS — R9431 Abnormal electrocardiogram [ECG] [EKG]: Secondary | ICD-10-CM | POA: Diagnosis present

## 2019-09-30 DIAGNOSIS — R270 Ataxia, unspecified: Secondary | ICD-10-CM | POA: Diagnosis present

## 2019-09-30 DIAGNOSIS — Z803 Family history of malignant neoplasm of breast: Secondary | ICD-10-CM | POA: Diagnosis not present

## 2019-09-30 DIAGNOSIS — M170 Bilateral primary osteoarthritis of knee: Secondary | ICD-10-CM | POA: Diagnosis present

## 2019-09-30 DIAGNOSIS — I6350 Cerebral infarction due to unspecified occlusion or stenosis of unspecified cerebral artery: Secondary | ICD-10-CM | POA: Diagnosis not present

## 2019-09-30 DIAGNOSIS — F17210 Nicotine dependence, cigarettes, uncomplicated: Secondary | ICD-10-CM | POA: Diagnosis present

## 2019-09-30 DIAGNOSIS — K5730 Diverticulosis of large intestine without perforation or abscess without bleeding: Secondary | ICD-10-CM | POA: Diagnosis present

## 2019-09-30 DIAGNOSIS — Z791 Long term (current) use of non-steroidal anti-inflammatories (NSAID): Secondary | ICD-10-CM | POA: Diagnosis not present

## 2019-09-30 DIAGNOSIS — I679 Cerebrovascular disease, unspecified: Secondary | ICD-10-CM | POA: Diagnosis not present

## 2019-09-30 DIAGNOSIS — N179 Acute kidney failure, unspecified: Secondary | ICD-10-CM | POA: Diagnosis present

## 2019-09-30 DIAGNOSIS — Z880 Allergy status to penicillin: Secondary | ICD-10-CM

## 2019-09-30 DIAGNOSIS — Z79899 Other long term (current) drug therapy: Secondary | ICD-10-CM | POA: Diagnosis not present

## 2019-09-30 DIAGNOSIS — T502X5A Adverse effect of carbonic-anhydrase inhibitors, benzothiadiazides and other diuretics, initial encounter: Secondary | ICD-10-CM | POA: Diagnosis present

## 2019-09-30 DIAGNOSIS — Z83511 Family history of glaucoma: Secondary | ICD-10-CM | POA: Diagnosis not present

## 2019-09-30 DIAGNOSIS — E854 Organ-limited amyloidosis: Secondary | ICD-10-CM | POA: Diagnosis present

## 2019-09-30 DIAGNOSIS — R7309 Other abnormal glucose: Secondary | ICD-10-CM | POA: Diagnosis not present

## 2019-09-30 DIAGNOSIS — F1410 Cocaine abuse, uncomplicated: Secondary | ICD-10-CM | POA: Diagnosis present

## 2019-09-30 DIAGNOSIS — I5032 Chronic diastolic (congestive) heart failure: Secondary | ICD-10-CM | POA: Diagnosis present

## 2019-09-30 DIAGNOSIS — Z7982 Long term (current) use of aspirin: Secondary | ICD-10-CM | POA: Diagnosis not present

## 2019-09-30 DIAGNOSIS — Z833 Family history of diabetes mellitus: Secondary | ICD-10-CM

## 2019-09-30 DIAGNOSIS — G4700 Insomnia, unspecified: Secondary | ICD-10-CM | POA: Diagnosis present

## 2019-09-30 DIAGNOSIS — M1711 Unilateral primary osteoarthritis, right knee: Secondary | ICD-10-CM | POA: Diagnosis present

## 2019-09-30 DIAGNOSIS — M1712 Unilateral primary osteoarthritis, left knee: Secondary | ICD-10-CM | POA: Diagnosis present

## 2019-09-30 DIAGNOSIS — I680 Cerebral amyloid angiopathy: Secondary | ICD-10-CM | POA: Diagnosis present

## 2019-09-30 DIAGNOSIS — E869 Volume depletion, unspecified: Secondary | ICD-10-CM | POA: Diagnosis not present

## 2019-09-30 DIAGNOSIS — G894 Chronic pain syndrome: Secondary | ICD-10-CM | POA: Diagnosis present

## 2019-09-30 DIAGNOSIS — M47816 Spondylosis without myelopathy or radiculopathy, lumbar region: Secondary | ICD-10-CM | POA: Diagnosis present

## 2019-09-30 DIAGNOSIS — G441 Vascular headache, not elsewhere classified: Secondary | ICD-10-CM | POA: Diagnosis present

## 2019-09-30 DIAGNOSIS — I951 Orthostatic hypotension: Secondary | ICD-10-CM | POA: Diagnosis not present

## 2019-09-30 DIAGNOSIS — I69398 Other sequelae of cerebral infarction: Secondary | ICD-10-CM | POA: Diagnosis present

## 2019-09-30 DIAGNOSIS — R7303 Prediabetes: Secondary | ICD-10-CM | POA: Diagnosis present

## 2019-09-30 DIAGNOSIS — I6389 Other cerebral infarction: Secondary | ICD-10-CM | POA: Diagnosis not present

## 2019-09-30 DIAGNOSIS — F17200 Nicotine dependence, unspecified, uncomplicated: Secondary | ICD-10-CM | POA: Diagnosis present

## 2019-09-30 DIAGNOSIS — I10 Essential (primary) hypertension: Secondary | ICD-10-CM | POA: Diagnosis present

## 2019-09-30 DIAGNOSIS — I959 Hypotension, unspecified: Secondary | ICD-10-CM | POA: Diagnosis not present

## 2019-09-30 DIAGNOSIS — K439 Ventral hernia without obstruction or gangrene: Secondary | ICD-10-CM | POA: Diagnosis present

## 2019-09-30 DIAGNOSIS — M8580 Other specified disorders of bone density and structure, unspecified site: Secondary | ICD-10-CM | POA: Diagnosis present

## 2019-09-30 DIAGNOSIS — R110 Nausea: Secondary | ICD-10-CM | POA: Diagnosis not present

## 2019-09-30 LAB — URINALYSIS, ROUTINE W REFLEX MICROSCOPIC
Bilirubin Urine: NEGATIVE
Glucose, UA: NEGATIVE mg/dL
Hgb urine dipstick: NEGATIVE
Ketones, ur: NEGATIVE mg/dL
Nitrite: NEGATIVE
Protein, ur: NEGATIVE mg/dL
Specific Gravity, Urine: 1.024 (ref 1.005–1.030)
pH: 8 (ref 5.0–8.0)

## 2019-09-30 LAB — CBC
HCT: 46.5 % — ABNORMAL HIGH (ref 36.0–46.0)
Hemoglobin: 14.2 g/dL (ref 12.0–15.0)
MCH: 27.6 pg (ref 26.0–34.0)
MCHC: 30.5 g/dL (ref 30.0–36.0)
MCV: 90.3 fL (ref 80.0–100.0)
Platelets: 259 10*3/uL (ref 150–400)
RBC: 5.15 MIL/uL — ABNORMAL HIGH (ref 3.87–5.11)
RDW: 13.2 % (ref 11.5–15.5)
WBC: 4.9 10*3/uL (ref 4.0–10.5)
nRBC: 0 % (ref 0.0–0.2)

## 2019-09-30 LAB — COMPREHENSIVE METABOLIC PANEL
ALT: 16 U/L (ref 0–44)
AST: 16 U/L (ref 15–41)
Albumin: 3.7 g/dL (ref 3.5–5.0)
Alkaline Phosphatase: 65 U/L (ref 38–126)
Anion gap: 11 (ref 5–15)
BUN: 23 mg/dL (ref 8–23)
CO2: 27 mmol/L (ref 22–32)
Calcium: 8.9 mg/dL (ref 8.9–10.3)
Chloride: 105 mmol/L (ref 98–111)
Creatinine, Ser: 1 mg/dL (ref 0.44–1.00)
GFR calc Af Amer: >60 mL/min (ref >60)
GFR calc non Af Amer: 58 mL/min — ABNORMAL LOW (ref >60)
Glucose, Bld: 163 mg/dL — ABNORMAL HIGH (ref 70–99)
Potassium: 4.2 mmol/L (ref 3.5–5.1)
Sodium: 143 mmol/L (ref 135–145)
Total Bilirubin: 0.6 mg/dL (ref 0.3–1.2)
Total Protein: 7.2 g/dL (ref 6.5–8.1)

## 2019-09-30 LAB — POC SARS CORONAVIRUS 2 AG -  ED: SARS Coronavirus 2 Ag: NEGATIVE

## 2019-09-30 LAB — LIPASE, BLOOD: Lipase: 24 U/L (ref 11–51)

## 2019-09-30 MED ORDER — MECLIZINE HCL 25 MG PO TABS
25.0000 mg | ORAL_TABLET | Freq: Once | ORAL | Status: AC
  Administered 2019-09-30: 25 mg via ORAL
  Filled 2019-09-30: qty 1

## 2019-09-30 MED ORDER — IOHEXOL 300 MG/ML  SOLN
100.0000 mL | Freq: Once | INTRAMUSCULAR | Status: AC | PRN
  Administered 2019-09-30: 100 mL via INTRAVENOUS

## 2019-09-30 MED ORDER — LORAZEPAM 2 MG/ML IJ SOLN
1.0000 mg | Freq: Once | INTRAMUSCULAR | Status: AC
  Administered 2019-09-30: 1 mg via INTRAVENOUS
  Filled 2019-09-30: qty 1

## 2019-09-30 MED ORDER — METOCLOPRAMIDE HCL 5 MG/ML IJ SOLN
10.0000 mg | Freq: Once | INTRAMUSCULAR | Status: AC
  Administered 2019-09-30: 10 mg via INTRAVENOUS
  Filled 2019-09-30: qty 2

## 2019-09-30 MED ORDER — ONDANSETRON HCL 4 MG/2ML IJ SOLN
4.0000 mg | Freq: Once | INTRAMUSCULAR | Status: AC
  Administered 2019-09-30: 4 mg via INTRAVENOUS
  Filled 2019-09-30: qty 2

## 2019-09-30 MED ORDER — SODIUM CHLORIDE 0.9% FLUSH
3.0000 mL | Freq: Once | INTRAVENOUS | Status: AC
  Administered 2019-10-01: 3 mL via INTRAVENOUS

## 2019-09-30 MED ORDER — SODIUM CHLORIDE 0.9 % IV BOLUS
1000.0000 mL | Freq: Once | INTRAVENOUS | Status: AC
  Administered 2019-09-30: 1000 mL via INTRAVENOUS

## 2019-09-30 NOTE — ED Notes (Signed)
Patient c/o dizziness onset this am while at work. States she ate thinking that would make her feel better. C/o increased dizziness with nausea with movement. Denies pain. Alert oriented.

## 2019-09-30 NOTE — ED Provider Notes (Signed)
Care of the patient assumed at signout.  On my initial exam the patient continues to complain of disequilibrium and nausea.  On attempting to sit patient upright she continues to complain of nausea, and is unable to sit upright, vomits.  I reviewed patient's initial studies, which are generally reassuring, vitals unremarkable aside from persistently elevated blood pressure.  Update:, Patient continues to complain of nausea, vomiting, disequilibrium.  MRI ordered.  10:03 PM RI demonstrates microhemorrhages in the pons, which may be contributing to the patient's persistent vestibular dysfunction, nausea, vomiting.  I discussed this with our neurology colleagues, and with the patient, subsequently with internal medicine as well.  Given these concerns, patient's elevated blood pressure, she will require admission for further monitoring, management.   Carmin Muskrat, MD 09/30/19 2234

## 2019-09-30 NOTE — Consult Note (Addendum)
NEURO HOSPITALIST CONSULT NOTE   Requestig physician: Dr. Aileen Fass  Reason for Consult: Vertigo  History obtained from:    Patient and Chart    HPI:                                                                                                                                          Alison Whitehead is an 69 y.o. female who presented to the [facility] ED on Friday afternoon with c/c dizziness of new onset, starting while at work. Her dizziness would increase with movement and she also experienced nausea with movement. After she ate something at work, she started vomiting. After vomiting, she had onset of diarrhea and abdominal pain. Her dizziness was described as spinning in conjunction with presyncopal sensation. She was hypertensive in the ED.   At the time of her initial evaluation by the EDP, she denied fever, chills, headache, vision changes, unilateral weakness, chest pain, SOB, cough and urinary symptoms.   An MRI brain was obtained, revealing a subacute right posterior pontine microhemorrhage with a rim of surrounding hyperintense DWI signal.   MRI brain: 1. Findings consistent with acute/early subacute microhemorrhage or hemorrhagic lacunar infarct in the posterior right pons. 2. Advanced chronic small vessel ischemic disease. Multiple chronic lacunar infarcts in the bilateral basal ganglia and thalami. 3. Multiple supratentorial chronic microhemorrhages with a central predominance. 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 4. Trace left mastoid effusion   Past Medical History:  Diagnosis Date  . Arthritis   . DDD (degenerative disc disease)   . Degenerative disc disease, lumbar   . Hypertension   . IBS (irritable bowel syndrome)   . Osteopenia     Past Surgical History:  Procedure Laterality Date  . SHOULDER SURGERY      Family History  Problem Relation Age of Onset  . Breast cancer Maternal Aunt    . Diabetes Mother   . Hypertension Mother   . Glaucoma Mother   . Diabetes Sister   . Hypertension Sister   . Cancer Sister               Social History:  reports that she has been smoking. She has never used smokeless tobacco. She reports that she does not drink alcohol or use drugs.  Allergies  Allergen Reactions  . Penicillins Itching    MEDICATIONS:  Prior to Admission:  Medications Prior to Admission  Medication Sig Dispense Refill Last Dose  . amLODipine (NORVASC) 10 MG tablet Take 1 tablet (10 mg total) by mouth daily. 30 tablet 1   . cetirizine (ZYRTEC) 10 MG tablet Take 10 mg by mouth daily.     . clonazePAM (KLONOPIN) 1 MG tablet Take 1 tablet (1 mg total) by mouth 3 (three) times daily as needed for anxiety. 90 tablet 0   . diclofenac sodium (VOLTAREN) 1 % GEL Apply 2-4 g topically 4 (four) times daily. 500 g 1   . dicyclomine (BENTYL) 20 MG tablet Take 20 mg by mouth every 6 (six) hours.     Marland Kitchen HYDROcodone-acetaminophen (NORCO) 10-325 MG tablet Take 1 tablet by mouth 3 (three) times daily as needed. 90 tablet 0   . ibuprofen (ADVIL,MOTRIN) 800 MG tablet Take 1 tablet (800 mg total) by mouth every 8 (eight) hours as needed. 30 tablet 0   . lisinopril (PRINIVIL,ZESTRIL) 20 MG tablet Take 1 tablet (20 mg total) by mouth daily. 30 tablet 1   . loratadine (CLARITIN) 10 MG tablet Take 10 mg by mouth daily.     . solifenacin (VESICARE) 5 MG tablet Take 5 mg by mouth daily.      Scheduled: . amLODipine  10 mg Oral Daily  . atorvastatin  40 mg Oral q1800  . feeding supplement (ENSURE ENLIVE)  237 mL Oral BID BM  . lisinopril  20 mg Oral Daily  . sodium chloride flush  3 mL Intravenous Once   Continuous: . sodium chloride 100 mL/hr at 10/01/19 0034     ROS:                                                                                                                                        As per HPI.   Blood pressure (!) 163/101, pulse 67, temperature 97.7 F (36.5 C), temperature source Oral, resp. rate 13, SpO2 93 %.   General Examination:                                                                                                       Physical Exam  HEENT-  [place]/AT   Lungs- Respirations unlabored Extremities- No edema  Neurological Examination Mental Status: Awake, alert and oriented x 5. Subdued affect. Speech fluent with intact comprehension and naming. No dysarthria.  Cranial Nerves: II: Visual fields grossly normal. PERRL.   III,IV, VI: No ptosis. EOM are full  horizontally and vertically without nystagmus or diplopia.  V,VII: Smile symmetric, facial temp sensation equal bilaterally VIII: hearing intact to voice IX,X: No hoarseness XI: Symmetric XII: midline tongue extension Motor: Right : Upper extremity   5/5    Left:     Upper extremity   5/5  Lower extremity   5/5     Lower extremity   5/5 No pronator drift Sensory: Temp and light touch intact throughout, bilaterally. No extinction to DSS.  Deep Tendon Reflexes: 1+ bilateral brachioradialis and biceps. 1+ right patella, 0 left patella. 0 achilles bilaterally.  Cerebellar: Right FNF with ataxia. Left FNF unremarkable.  Gait: Deferred   Lab Results: Basic Metabolic Panel: Recent Labs  Lab 09/30/19 1407  NA 143  K 4.2  CL 105  CO2 27  GLUCOSE 163*  BUN 23  CREATININE 1.00  CALCIUM 8.9    CBC: Recent Labs  Lab 09/30/19 1407  WBC 4.9  HGB 14.2  HCT 46.5*  MCV 90.3  PLT 259    Cardiac Enzymes: No results for input(s): CKTOTAL, CKMB, CKMBINDEX, TROPONINI in the last 168 hours.  Lipid Panel: No results for input(s): CHOL, TRIG, HDL, CHOLHDL, VLDL, LDLCALC in the last 168 hours.  Imaging: CT Head Wo Contrast  Result Date: 09/30/2019 CLINICAL DATA:  Dizziness. EXAM: CT HEAD WITHOUT CONTRAST TECHNIQUE: Contiguous axial images were obtained from the base  of the skull through the vertex without intravenous contrast. COMPARISON:  None. FINDINGS: Brain: No evidence of acute infarction, hemorrhage, hydrocephalus, extra-axial collection or mass lesion/mass effect. Extensive hypoattenuation in the subcortical and periventricular deep white matter consistent with chronic microvascular ischemic change is noted. Vascular: Atherosclerosis. Skull: Intact.  No focal lesion. Sinuses/Orbits: Paranasal sinuses are clear. The patient has a remote fracture of the medial wall of the right orbit and remote nasal bone fractures. Other: None. IMPRESSION: No acute abnormality. Extensive chronic microvascular ischemic change. Atherosclerosis. Remote fractures of the medial wall of the right orbit and nasal bones. Electronically Signed   By: Inge Rise M.D.   On: 09/30/2019 16:13   MR BRAIN WO CONTRAST  Result Date: 09/30/2019 CLINICAL DATA:  Ataxia, stroke suspected. Additional history provided: Dizziness and nausea. EXAM: MRI HEAD WITHOUT CONTRAST TECHNIQUE: Multiplanar, multiecho pulse sequences of the brain and surrounding structures were obtained without intravenous contrast. COMPARISON:  Noncontrast head CT performed earlier the same day 09/30/2019 FINDINGS: Brain: There is a subcentimeter focus of restricted diffusion within the dorsal right pons. Corresponding punctate focus of SWI signal loss at this site (series 7, image 52) (series 14, image 14). Findings are suggestive of an acute/early subacute microhemorrhage or hemorrhagic lacunar infarct. No evidence of acute infarct elsewhere within the brain. No evidence of intracranial mass. No midline shift or extra-axial fluid collection. Severe patchy and confluent T2/FLAIR hyperintensity within the cerebral white matter which is nonspecific, but consistent with chronic small vessel ischemic disease. Multiple chronic lacunar infarcts within the bilateral basal ganglia and thalami. To a lesser degree, there are chronic  small-vessel ischemic changes within the pons. There are multiple chronic microhemorrhages within the right basal ganglia and bilateral thalami. A few chronic microhemorrhages are also present within the posterior cerebral hemispheres. Cerebral volume is normal for age. Vascular: Flow voids maintained within the proximal large arterial vessels. Skull and upper cervical spine: No focal marrow lesion. Sinuses/Orbits: Visualized orbits demonstrate no acute abnormality. Mild scattered paranasal sinus mucosal thickening. Trace fluid within left mastoid air cells. IMPRESSION: 1. Findings consistent with acute/early subacute microhemorrhage or  hemorrhagic lacunar infarct in the posterior right pons as described. 2. Advanced chronic small vessel ischemic disease. Multiple chronic lacunar infarcts in the bilateral basal ganglia and thalami. 3. Multiple supratentorial chronic microhemorrhages with a central predominance. 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 4. Trace left mastoid effusion Electronically Signed   By: Kellie Simmering DO   On: 09/30/2019 21:14   CT Abdomen Pelvis W Contrast  Result Date: 09/30/2019 CLINICAL DATA:  Nausea, vomiting and weakness. EXAM: CT ABDOMEN AND PELVIS WITH CONTRAST TECHNIQUE: Multidetector CT imaging of the abdomen and pelvis was performed using the standard protocol following bolus administration of intravenous contrast. CONTRAST:  11mL OMNIPAQUE IOHEXOL 300 MG/ML  SOLN COMPARISON:  05/20/2016 FINDINGS: Lower chest: Patchy bibasilar infiltrates are noted, left greater than right. No pleural effusions. The heart is enlarged. No pericardial effusion. Moderate tortuosity of the thoracic aorta. The distal esophagus is grossly normal. Hepatobiliary: No focal hepatic lesions or intrahepatic biliary dilatation. The gallbladder is grossly normal. No common bile duct dilatation. Pancreas: No mass, inflammation or ductal dilatation. Spleen: Normal  size. No focal lesions. Adrenals/Urinary Tract: The adrenal glands are unremarkable. The kidneys demonstrate moderate renal cortical scarring type changes but no evidence of acute pyelonephritis or abscess. No hydroureteronephrosis. The bladder is grossly normal. Stomach/Bowel: The stomach, duodenum, small bowel and colon are grossly normal without oral contrast. No acute inflammatory changes, mass lesions or obstructive findings. The terminal ileum and appendix are normal. Fairly extensive descending and sigmoid colon diverticulosis but no findings for acute diverticulitis. Vascular/Lymphatic: The aorta and branch vessels are patent. Mild tortuosity and scattered atherosclerotic calcifications. No aneurysm. Branch vessels are patent. The major venous structures are patent. No mesenteric or retroperitoneal mass or adenopathy. Reproductive: Enlarged fibroid uterus. The ovaries are grossly normal. Other: No pelvic mass or adenopathy. No free pelvic fluid collections. No inguinal mass or adenopathy. Small anterior abdominal wall hernia containing fat. Musculoskeletal: No significant bony findings. IMPRESSION: 1. No acute abdominal/pelvic findings, mass lesions or adenopathy. 2. Bibasilar infiltrates, left greater than right. 3. Enlarged fibroid uterus. 4. Descending and sigmoid colon diverticulosis without findings for acute diverticulitis. 5. Small anterior abdominal wall hernia containing fat. Electronically Signed   By: Marijo Sanes M.D.   On: 09/30/2019 16:38   MRI brain official Radiology report: 1. Findings consistent with acute/early subacute microhemorrhage or hemorrhagic lacunar infarct in the posterior right pons as described. 2. Advanced chronic small vessel ischemic disease. Multiple chronic lacunar infarcts in the bilateral basal ganglia and thalami. 3. Multiple supratentorial chronic microhemorrhages with a central predominance. Findings are nonspecific but may reflect sequela of chronic  hypertensive microangiopathy. 4. 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 Chronic hypertensive microangiopathy is also a differential diagnostic consideration regarding etiology.   Assessment:  1. Presentation most likely secondary to the lesion described in #2, below.  2. 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 Chronic hypertensive microangiopathy is also a differential diagnostic consideration regarding etiology.  3. Also seen on MRI is advanced chronic small vessel ischemic disease, multiple chronic lacunar infarcts in the bilateral basal ganglia and thalami, and multiple supratentorial chronic microhemorrhages with a central predominance.  4. Exam reveals right  upper extremity ataxia.  5. NIHSS: 1  Recommendations: 1. BP management. SBP goal of < 140 2. PT/OT/Speech   Electronically signed: Dr. Kerney Elbe 09/30/2019, 9:41 PM

## 2019-09-30 NOTE — ED Notes (Signed)
Dizziness and light sensitive  Lights dimmed

## 2019-09-30 NOTE — ED Notes (Signed)
I have spoken to her sister pather number is [phone]

## 2019-09-30 NOTE — ED Notes (Signed)
The pts sister has been updated

## 2019-09-30 NOTE — ED Triage Notes (Signed)
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 Started vomiting and having pain before leaving home this am, gradually gotten worse

## 2019-09-30 NOTE — ED Notes (Signed)
We are unable to get orthostatics  Pt cannot tolerate it  Dr Vanita Panda notified

## 2019-09-30 NOTE — ED Notes (Addendum)
The pt is   Her sister has been given an updatealseep now  She just had more medicine

## 2019-09-30 NOTE — ED Notes (Signed)
Pure wick has been placed for urinary frequency

## 2019-09-30 NOTE — ED Notes (Signed)
The pt is calling her sister to update her

## 2019-09-30 NOTE — ED Provider Notes (Signed)
[facility] EMERGENCY DEPARTMENT Provider Note   CSN: [id number] Arrival date & time: 09/30/19  1345     History Chief Complaint  Patient presents with  . Nausea  . Weakness    Alison Whitehead is a 69 y.o. female with history of chronic pain syndrome and HTN who presents with dizziness and nausea. She is somewhat of a poor historian. She states that she woke up and felt dizzy. She states things feel like they are spinning but states that she feels lightheaded like she is going to pass out as well. Any movement makes dizziness worse. She went to work and tried to eat breakfast thinking it would help and then became nauseous and started vomiting. She also reports having diarrhea and abdominal pain. She cannot localized the abdominal pain and states it started after she was vomiting. She denies fever, chills, headache, vision changes, unilateral weakness, chest pain, SOB, cough, urinary symptoms. She has not had these symptoms before. She thought it may be due to food poisoning but is not sure what she ate that would have caused this. She hasn't been able to ambulate due to generalized weakness and dizziness.   HPI     Past Medical History:  Diagnosis Date  . Arthritis   . DDD (degenerative disc disease)   . Degenerative disc disease, lumbar   . Hypertension   . IBS (irritable bowel syndrome)   . Osteopenia     Patient Active Problem List   Diagnosis Date Noted  . Unilateral primary osteoarthritis, left knee 03/17/2019  . Unilateral primary osteoarthritis, right knee 03/17/2019  . Bilateral primary osteoarthritis of knee 03/10/2019  . INSOMNIA, CHRONIC 05/19/2007  . SPONDYLOSIS, LUMBAR 05/19/2007  . HYPERTENSION 05/03/2007  . ARTHRITIS, KNEE 05/03/2007    Past Surgical History:  Procedure Laterality Date  . SHOULDER SURGERY       OB History   No obstetric history on file.     Family History  Problem Relation Age of Onset  . Breast cancer  Maternal Aunt   . Diabetes Mother   . Hypertension Mother   . Glaucoma Mother   . Diabetes Sister   . Hypertension Sister   . Cancer Sister     Social History   Tobacco Use  . Smoking status: Current Every Day Smoker  . Smokeless tobacco: Never Used  Substance Use Topics  . Alcohol use: No  . Drug use: No    Home Medications Prior to Admission medications   Medication Sig Start Date End Date Taking? Authorizing Provider  amLODipine (NORVASC) 10 MG tablet Take 1 tablet (10 mg total) by mouth daily. 02/24/18   Vanessa Kick, MD  cetirizine (ZYRTEC) 10 MG tablet Take 10 mg by mouth daily.    [provider]  clonazePAM (KLONOPIN) 1 MG tablet Take 1 tablet (1 mg total) by mouth 3 (three) times daily as needed for anxiety. 02/24/18   Vanessa Kick, MD  diclofenac sodium (VOLTAREN) 1 % GEL Apply 2-4 g topically 4 (four) times daily. 09/10/16   Cherylann Ratel, PA-C  dicyclomine (BENTYL) 20 MG tablet Take 20 mg by mouth every 6 (six) hours.    [provider]  HYDROcodone-acetaminophen (NORCO) 10-325 MG tablet Take 1 tablet by mouth 3 (three) times daily as needed. 02/24/18   Vanessa Kick, MD  ibuprofen (ADVIL,MOTRIN) 800 MG tablet Take 1 tablet (800 mg total) by mouth every 8 (eight) hours as needed. 07/08/16   Cherylann Ratel, PA-C  lisinopril (  PRINIVIL,ZESTRIL) 20 MG tablet Take 1 tablet (20 mg total) by mouth daily. 02/24/18   Vanessa Kick, MD  loratadine (CLARITIN) 10 MG tablet Take 10 mg by mouth daily.    [provider]  solifenacin (VESICARE) 5 MG tablet Take 5 mg by mouth daily.    [provider]    Allergies    Penicillins  Review of Systems   Review of Systems  Constitutional: Positive for fatigue. Negative for chills and fever.  Respiratory: Negative for cough and shortness of breath.   Cardiovascular: Negative for chest pain.  Gastrointestinal: Positive for abdominal pain, diarrhea, nausea and vomiting.  Genitourinary: Negative for  dysuria and flank pain.  Neurological: Positive for dizziness and weakness. Negative for syncope, numbness and headaches.  All other systems reviewed and are negative.   Physical Exam Updated Vital Signs BP (!) 176/103 (BP Location: Left Arm)   Pulse 66   Temp 97.7 F (36.5 C) (Oral)   Resp 16   SpO2 94%   Physical Exam Vitals and nursing note reviewed.  Constitutional:      General: She is not in acute distress.    Appearance: She is well-developed and well-groomed.     Comments: Fatigued. Has a rag over her eyes  HENT:     Head: Normocephalic and atraumatic.  Eyes:     General: No scleral icterus.       Right eye: No discharge.        Left eye: No discharge.     Extraocular Movements: Extraocular movements intact.     Conjunctiva/sclera: Conjunctivae normal.     Pupils: Pupils are equal, round, and reactive to light.  Cardiovascular:     Rate and Rhythm: Normal rate and regular rhythm.  Pulmonary:     Effort: Pulmonary effort is normal. No respiratory distress.     Breath sounds: Normal breath sounds.  Abdominal:     General: There is no distension.     Palpations: Abdomen is soft.     Tenderness: There is abdominal tenderness (diffuse, mild ).  Musculoskeletal:     Cervical back: Normal range of motion.  Skin:    General: Skin is warm and dry.  Neurological:     Mental Status: She is oriented to person, place, and time. She is lethargic.     Comments: Lying on stretcher in NAD. GCS 15. Speaks in a clear voice. Cranial nerves II through XII grossly intact. 5/5 strength in all extremities. Sensation fully intact.  Bilateral finger-to-nose intact. Gait not tested   Psychiatric:        Behavior: Behavior normal. Behavior is cooperative.     ED Results / Procedures / Treatments   Labs (all labs ordered are listed, but only abnormal results are displayed) Labs Reviewed  CBC - Abnormal; Notable for the following components:      Result Value   RBC 5.15 (*)    HCT  46.5 (*)    All other components within normal limits  LIPASE, BLOOD  COMPREHENSIVE METABOLIC PANEL  URINALYSIS, ROUTINE W REFLEX MICROSCOPIC  POC SARS CORONAVIRUS 2 AG -  ED  TROPONIN I (HIGH SENSITIVITY)    EKG EKG Interpretation  Date/Time:  Friday September 30 2019 13:46:48 EST Ventricular Rate:  66 PR Interval:  184 QRS Duration: 96 QT Interval:  474 QTC Calculation: 496 R Axis:   11 Text Interpretation: Normal sinus rhythm with sinus arrhythmia Possible Left atrial enlargement Left ventricular hypertrophy with repolarization abnormality ( Sokolow-Lyon ,  Cornell product , Romhilt-Estes ) Prolonged QT Abnormal ECG No previous ECGs available Confirmed by Ezequiel Essex [phone]) on 09/30/2019 2:10:13 PM   Radiology No results found.  Procedures Procedures (including critical care time)  Medications Ordered in ED Medications  sodium chloride flush (NS) 0.9 % injection 3 mL (has no administration in time range)  sodium chloride 0.9 % bolus 1,000 mL (1,000 mLs Intravenous New Bag/Given 09/30/19 1514)  ondansetron (ZOFRAN) injection 4 mg (4 mg Intravenous Given 09/30/19 1512)  meclizine (ANTIVERT) tablet 25 mg (25 mg Oral Given 09/30/19 1512)    ED Course  I have reviewed the triage vital signs and the nursing notes.  Pertinent labs & imaging results that were available during my care of the patient were reviewed by me and considered in my medical decision making (see chart for details).  69 year old female presents with acute onset of dizziness, N/V/D and abdominal pain. Pt has difficulty describing dizziness and initially says is more of a spinning and then says its more of a lightheadedness/near syncope. She is fatigued and uncomfortable appearing, speaking a low soft voice making it difficult to understand her. I have to ask her repeated questions at times. She is answering appropriately however and follows commands slowly.  EKG is SR with flipped T waves in inferior leads  and V5 and V6 and flipped P waves in V1 and V2. There is no old EKG to compare. Will add on troponin. CT head and abdomen/pelvis ordered. Labs and UA are pending.  At shift change imaging is pending. If abnormal or pt is not improving would admit. If patient feels better and imaging/work up is reassuring would d/c home with outpatient f/u  MDM Rules/Calculators/A&P                       Final Clinical Impression(s) / ED Diagnoses Final diagnoses:  Dizziness    Rx / DC Orders ED Discharge Orders    None       Recardo Evangelist, PA-C 09/30/19 1718    Pattricia Boss, MD 10/06/19 1343

## 2019-10-01 ENCOUNTER — Other Ambulatory Visit: Payer: Self-pay

## 2019-10-01 DIAGNOSIS — I10 Essential (primary) hypertension: Secondary | ICD-10-CM

## 2019-10-01 LAB — CBC
HCT: 45.9 % (ref 36.0–46.0)
Hemoglobin: 14.1 g/dL (ref 12.0–15.0)
MCH: 27.4 pg (ref 26.0–34.0)
MCHC: 30.7 g/dL (ref 30.0–36.0)
MCV: 89.3 fL (ref 80.0–100.0)
Platelets: 305 10*3/uL (ref 150–400)
RBC: 5.14 MIL/uL — ABNORMAL HIGH (ref 3.87–5.11)
RDW: 13.2 % (ref 11.5–15.5)
WBC: 5.3 10*3/uL (ref 4.0–10.5)
nRBC: 0 % (ref 0.0–0.2)

## 2019-10-01 LAB — COMPREHENSIVE METABOLIC PANEL
ALT: 14 U/L (ref 0–44)
AST: 16 U/L (ref 15–41)
Albumin: 3.5 g/dL (ref 3.5–5.0)
Alkaline Phosphatase: 61 U/L (ref 38–126)
Anion gap: 9 (ref 5–15)
BUN: 16 mg/dL (ref 8–23)
CO2: 27 mmol/L (ref 22–32)
Calcium: 8.8 mg/dL — ABNORMAL LOW (ref 8.9–10.3)
Chloride: 106 mmol/L (ref 98–111)
Creatinine, Ser: 1.01 mg/dL — ABNORMAL HIGH (ref 0.44–1.00)
GFR calc Af Amer: >60 mL/min (ref >60)
GFR calc non Af Amer: 58 mL/min — ABNORMAL LOW (ref >60)
Glucose, Bld: 130 mg/dL — ABNORMAL HIGH (ref 70–99)
Potassium: 4.3 mmol/L (ref 3.5–5.1)
Sodium: 142 mmol/L (ref 135–145)
Total Bilirubin: 0.8 mg/dL (ref 0.3–1.2)
Total Protein: 7.1 g/dL (ref 6.5–8.1)

## 2019-10-01 LAB — RAPID URINE DRUG SCREEN, HOSP PERFORMED
Amphetamines: NOT DETECTED
Barbiturates: NOT DETECTED
Benzodiazepines: NOT DETECTED
Cocaine: POSITIVE — AB
Opiates: POSITIVE — AB
Tetrahydrocannabinol: NOT DETECTED

## 2019-10-01 LAB — TROPONIN I (HIGH SENSITIVITY): Troponin I (High Sensitivity): 10 ng/L (ref <18)

## 2019-10-01 LAB — TSH: TSH: 1.431 u[IU]/mL (ref 0.350–4.500)

## 2019-10-01 LAB — HIV ANTIBODY (ROUTINE TESTING W REFLEX): HIV Screen 4th Generation wRfx: NONREACTIVE

## 2019-10-01 MED ORDER — HYDRALAZINE HCL 10 MG PO TABS
10.0000 mg | ORAL_TABLET | Freq: Four times a day (QID) | ORAL | Status: DC | PRN
  Administered 2019-10-01 – 2019-10-02 (×2): 10 mg via ORAL
  Filled 2019-10-01 (×2): qty 1

## 2019-10-01 MED ORDER — POLYETHYLENE GLYCOL 3350 17 G PO PACK
17.0000 g | PACK | Freq: Every day | ORAL | Status: DC
  Administered 2019-10-01 – 2019-10-06 (×6): 17 g via ORAL
  Filled 2019-10-01 (×6): qty 1

## 2019-10-01 MED ORDER — CLOPIDOGREL BISULFATE 75 MG PO TABS: 75.0000 mg | ORAL_TABLET | Freq: Every day | ORAL | Status: DC

## 2019-10-01 MED ORDER — ONDANSETRON HCL 4 MG PO TABS
4.0000 mg | ORAL_TABLET | Freq: Four times a day (QID) | ORAL | Status: DC | PRN
  Administered 2019-10-02 – 2019-10-05 (×5): 4 mg via ORAL
  Filled 2019-10-01 (×5): qty 1

## 2019-10-01 MED ORDER — ENSURE ENLIVE PO LIQD
237.0000 mL | Freq: Two times a day (BID) | ORAL | Status: DC
  Administered 2019-10-01 – 2019-10-06 (×7): 237 mL via ORAL

## 2019-10-01 MED ORDER — LORATADINE 10 MG PO TABS
10.0000 mg | ORAL_TABLET | Freq: Every day | ORAL | Status: DC
  Administered 2019-10-01 – 2019-10-06 (×6): 10 mg via ORAL
  Filled 2019-10-01 (×6): qty 1

## 2019-10-01 MED ORDER — ADULT MULTIVITAMIN W/MINERALS CH
1.0000 | ORAL_TABLET | Freq: Every day | ORAL | Status: DC
  Administered 2019-10-01 – 2019-10-06 (×6): 1 via ORAL
  Filled 2019-10-01 (×6): qty 1

## 2019-10-01 MED ORDER — ONDANSETRON HCL 4 MG/2ML IJ SOLN
4.0000 mg | Freq: Four times a day (QID) | INTRAMUSCULAR | Status: DC | PRN
  Administered 2019-10-01 – 2019-10-05 (×6): 4 mg via INTRAVENOUS
  Filled 2019-10-01 (×7): qty 2

## 2019-10-01 MED ORDER — HYDROCODONE-ACETAMINOPHEN 10-325 MG PO TABS
1.0000 | ORAL_TABLET | ORAL | Status: DC | PRN
  Administered 2019-10-01 – 2019-10-05 (×9): 1 via ORAL
  Filled 2019-10-01 (×9): qty 1

## 2019-10-01 MED ORDER — ACETAMINOPHEN 325 MG PO TABS
650.0000 mg | ORAL_TABLET | Freq: Four times a day (QID) | ORAL | Status: DC | PRN
  Administered 2019-10-02 – 2019-10-05 (×3): 650 mg via ORAL
  Filled 2019-10-01 (×3): qty 2

## 2019-10-01 MED ORDER — SODIUM CHLORIDE 0.45 % IV SOLN: INTRAVENOUS | Status: DC

## 2019-10-01 MED ORDER — ASPIRIN 325 MG PO TABS
325.0000 mg | ORAL_TABLET | Freq: Every day | ORAL | Status: DC
  Administered 2019-10-02 – 2019-10-06 (×5): 325 mg via ORAL
  Filled 2019-10-01 (×5): qty 1

## 2019-10-01 MED ORDER — CLONAZEPAM 0.5 MG PO TABS: 1.0000 mg | ORAL_TABLET | Freq: Three times a day (TID) | ORAL | Status: DC | PRN

## 2019-10-01 MED ORDER — ACETAMINOPHEN 650 MG RE SUPP: 650.0000 mg | Freq: Four times a day (QID) | RECTAL | Status: DC | PRN

## 2019-10-01 MED ORDER — LISINOPRIL 20 MG PO TABS
20.0000 mg | ORAL_TABLET | Freq: Every day | ORAL | Status: DC
  Administered 2019-10-01 – 2019-10-06 (×6): 20 mg via ORAL
  Filled 2019-10-01 (×6): qty 1

## 2019-10-01 MED ORDER — CARVEDILOL 3.125 MG PO TABS
3.1250 mg | ORAL_TABLET | Freq: Two times a day (BID) | ORAL | Status: DC
  Administered 2019-10-01 – 2019-10-03 (×5): 3.125 mg via ORAL
  Filled 2019-10-01 (×5): qty 1

## 2019-10-01 MED ORDER — ASPIRIN EC 81 MG PO TBEC: 81.0000 mg | DELAYED_RELEASE_TABLET | Freq: Every day | ORAL | Status: DC

## 2019-10-01 MED ORDER — AMLODIPINE BESYLATE 10 MG PO TABS
10.0000 mg | ORAL_TABLET | Freq: Every day | ORAL | Status: DC
  Administered 2019-10-01 – 2019-10-06 (×6): 10 mg via ORAL
  Filled 2019-10-01 (×6): qty 1

## 2019-10-01 MED ORDER — ATORVASTATIN CALCIUM 40 MG PO TABS
40.0000 mg | ORAL_TABLET | Freq: Every day | ORAL | Status: DC
  Administered 2019-10-01 – 2019-10-05 (×5): 40 mg via ORAL
  Filled 2019-10-01 (×5): qty 1

## 2019-10-01 NOTE — Progress Notes (Signed)
Initial Nutrition Assessment  DOCUMENTATION CODES:   Not applicable  INTERVENTION:  Continue Ensure Enlive po BID, each supplement provides 350 kcal and 20 grams of protein (strawberry)  MVI with minerals daily   NUTRITION DIAGNOSIS:   Increased nutrient needs related to acute illness(CVA) as evidenced by estimated needs.    GOAL:   Patient will meet greater than or equal to 90% of their needs    MONITOR:   Labs, I & O's, Supplement acceptance, PO intake, Weight trends  REASON FOR ASSESSMENT:   Malnutrition Screening Tool    ASSESSMENT:  RD working remotely.  69 year old female with past medical history of HTN, IBS, osteopenia, arthritis, DDD, presented with dizziness, nausea and vomiting, markedly elevated blood pressure noted upon ED arrival. CT head showed extensive chronic microvascular ischemic changes, MRI of brain showed acute to subacute hemorrhagic lacunar infarct in right pons.  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 Per medications, she is provided Ensure supplement twice daily.  Spoke with this very delightful patient via phone this afternoon. She reports having some jello and broth today, states that she is feeling hungry. Patient endorses receiving Ensure, but did not like the chocolate flavor and prefers strawberry. RD will update administration instructions with pt preference.  Patient reports good home po, stated that she loves food, avoids fried/greasy food d/t IBS. She recalls lots of baked chicken and salads. RD answered questions about inpatient menu, pt ready to hang up with RD to place order for [place] burger and salad.   I/Os: +525 ml since  admit UOP: 475 ml since admit  Current wt 183.7 lbs Per wt history pt has lost 6 lbs over the past 5 months which is insignificant for time frame.   Medications reviewed and include: Coreg, Miralax NaCl  Labs reviewed  NUTRITION - FOCUSED PHYSICAL EXAM: Unable to complete at this time. RD working remotely.    Diet Order:   Diet Order            Diet Heart Room service appropriate? Yes; Fluid consistency: Thin  Diet effective now              EDUCATION NEEDS:   No education needs have been identified at this time  Skin:  Skin Assessment: Reviewed RN Assessment  Last BM:  2/27  Height:   Ht Readings from Last 1 Encounters:  09/30/19 5\' 7"  (1.702 m)    Weight:   Wt Readings from Last 1 Encounters:  09/30/19 83.5 kg    Ideal Body Weight:  61.4 kg  BMI:  Body mass index is 28.83 kg/m.  Estimated Nutritional Needs:   Kcal:  1700-1900  Protein:  85-95  Fluid:  >/= 1.7 L/day   Lajuan Lines, RD, LDN Clinical Nutrition After Hours/Weekend Pager # in [place]

## 2019-10-01 NOTE — Progress Notes (Signed)
[facility] PROGRESS NOTE    Progress Note  Alison Whitehead  [id number] DOB: 1950-08-12 DOA: 09/30/2019 PCP: Patient, No Pcp Per     Brief Narrative:   Alison Whitehead is an 69 y.o. female past medical history significant for hypertension, DJD IBS who presents with dizziness and nausea started on the day of admission Alison Whitehead relates Alison Whitehead woke up in the room was spinning around her and almost fell to the ground became nauseated and vomiting.  MRI of the brain showed acute/subacute early microhemorrhage or hemorrhagic lacunar infarct in the posterior pons, chronic small vessel ischemia with multiple lacunar infarcts in the basal ganglia bilaterally and thalamus.  Alison Whitehead also had multiple supratentorial chronic microhemorrhagic changes question hypertensive microangiopathy.  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  They recommended to keep her goal blood pressure less than 140/80, consult physical therapy. Her blood pressure this morning continues to be elevated at greater than 150/90. Continue Norvasc 10 lisinopril will add low-dose Coreg. Check orthostatic vitals. KVO IV fluids. If blood pressure continues to be elevated not in control we will have to add a diuretic. Still has significant dizziness and anorexic consult physical therapy.    Essential hypertension See above for further details.  Johnson IV as needed her goal blood pressures less than 140/80.  Bilateral primary osteoarthritis of knee Continue tramadol for pain avoid ibuprofen.  Abnormal EKG: Likely due to longstanding uncontrolled hypertension, 2D echo is pending.    DVT prophylaxis: lovenox Family Communication:none Disposition Plan/Barrier to D/C: Once her blood pressure is at goal 140/80.  Code Status:     Code Status Orders    (From admission, onward)         Start     Ordered   10/01/19 0011  Full code  Continuous     10/01/19 0012        Code Status History    This patient has a current code status but no historical code status.   Advance Care Planning Activity        IV Access:    Peripheral IV   Procedures and diagnostic studies:   CT Head Wo Contrast  Result Date: 09/30/2019 CLINICAL DATA:  Dizziness. EXAM: CT HEAD WITHOUT CONTRAST TECHNIQUE: Contiguous axial images were obtained from the base of the skull through the vertex without intravenous contrast. COMPARISON:  None. FINDINGS: Brain: No evidence of acute infarction, hemorrhage, hydrocephalus, extra-axial collection or mass lesion/mass effect. Extensive hypoattenuation in the subcortical and periventricular deep white matter consistent with chronic microvascular ischemic change is noted. Vascular: Atherosclerosis. Skull: Intact.  No focal lesion. Sinuses/Orbits: Paranasal sinuses are clear. The patient has a remote fracture of the medial wall of the right orbit and remote nasal bone fractures. Other: None. IMPRESSION: No acute abnormality. Extensive chronic microvascular ischemic change. Atherosclerosis. Remote fractures of the medial wall of the right orbit and nasal bones. Electronically Signed   By: Inge Rise M.D.   On: 09/30/2019 16:13   MR BRAIN WO CONTRAST  Result Date: 09/30/2019 CLINICAL DATA:  Ataxia, stroke suspected. Additional history provided: Dizziness and nausea. EXAM: MRI HEAD WITHOUT CONTRAST TECHNIQUE: Multiplanar, multiecho pulse sequences of the brain and surrounding structures were obtained without intravenous contrast. COMPARISON:  Noncontrast head CT performed earlier the same day 09/30/2019 FINDINGS: Brain: There is a subcentimeter focus of restricted diffusion within the dorsal right pons. Corresponding punctate focus of SWI signal  loss at this site (series 7, image 52) (series 14, image 14). Findings are  suggestive of an acute/early subacute microhemorrhage or hemorrhagic lacunar infarct. No evidence of acute infarct elsewhere within the brain. No evidence of intracranial mass. No midline shift or extra-axial fluid collection. Severe patchy and confluent T2/FLAIR hyperintensity within the cerebral white matter which is nonspecific, but consistent with chronic small vessel ischemic disease. Multiple chronic lacunar infarcts within the bilateral basal ganglia and thalami. To a lesser degree, there are chronic small-vessel ischemic changes within the pons. There are multiple chronic microhemorrhages within the right basal ganglia and bilateral thalami. A few chronic microhemorrhages are also present within the posterior cerebral hemispheres. Cerebral volume is normal for age. Vascular: Flow voids maintained within the proximal large arterial vessels. Skull and upper cervical spine: No focal marrow lesion. Sinuses/Orbits: Visualized orbits demonstrate no acute abnormality. Mild scattered paranasal sinus mucosal thickening. Trace fluid within left mastoid air cells. IMPRESSION: 1. Findings consistent with acute/early subacute microhemorrhage or hemorrhagic lacunar infarct in the posterior right pons as described. 2. Advanced chronic small vessel ischemic disease. Multiple chronic lacunar infarcts in the bilateral basal ganglia and thalami. 3. Multiple supratentorial chronic microhemorrhages with a central predominance. 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 4. Trace left mastoid effusion Electronically Signed   By: Kellie Simmering DO   On: 09/30/2019 21:14   CT Abdomen Pelvis W Contrast  Result Date: 09/30/2019 CLINICAL DATA:  Nausea, vomiting and weakness. EXAM: CT ABDOMEN AND PELVIS WITH CONTRAST TECHNIQUE: Multidetector CT imaging of the abdomen and pelvis was performed using the standard protocol following bolus administration of intravenous contrast. CONTRAST:   147mL OMNIPAQUE IOHEXOL 300 MG/ML  SOLN COMPARISON:  05/20/2016 FINDINGS: Lower chest: Patchy bibasilar infiltrates are noted, left greater than right. No pleural effusions. The heart is enlarged. No pericardial effusion. Moderate tortuosity of the thoracic aorta. The distal esophagus is grossly normal. Hepatobiliary: No focal hepatic lesions or intrahepatic biliary dilatation. The gallbladder is grossly normal. No common bile duct dilatation. Pancreas: No mass, inflammation or ductal dilatation. Spleen: Normal size. No focal lesions. Adrenals/Urinary Tract: The adrenal glands are unremarkable. The kidneys demonstrate moderate renal cortical scarring type changes but no evidence of acute pyelonephritis or abscess. No hydroureteronephrosis. The bladder is grossly normal. Stomach/Bowel: The stomach, duodenum, small bowel and colon are grossly normal without oral contrast. No acute inflammatory changes, mass lesions or obstructive findings. The terminal ileum and appendix are normal. Fairly extensive descending and sigmoid colon diverticulosis but no findings for acute diverticulitis. Vascular/Lymphatic: The aorta and branch vessels are patent. Mild tortuosity and scattered atherosclerotic calcifications. No aneurysm. Branch vessels are patent. The major venous structures are patent. No mesenteric or retroperitoneal mass or adenopathy. Reproductive: Enlarged fibroid uterus. The ovaries are grossly normal. Other: No pelvic mass or adenopathy. No free pelvic fluid collections. No inguinal mass or adenopathy. Small anterior abdominal wall hernia containing fat. Musculoskeletal: No significant bony findings. IMPRESSION: 1. No acute abdominal/pelvic findings, mass lesions or adenopathy. 2. Bibasilar infiltrates, left greater than right. 3. Enlarged fibroid uterus. 4. Descending and sigmoid colon diverticulosis without findings for acute diverticulitis. 5. Small anterior abdominal wall hernia containing fat. Electronically  Signed   By: Marijo Sanes M.D.   On: 09/30/2019 16:38     Medical Consultants:    None.  Anti-Infectives:   None  Subjective:    Alison Whitehead Alison Whitehead continues to be anorexic and nauseated with dizziness.  Objective:    Vitals:  10/01/19 0300 10/01/19 0344 10/01/19 0927 10/01/19 0935  BP: (!) 158/109 (!) 160/96 (!) 148/109 (!) 156/98  Pulse: 81 66 67 68  Resp:  18 16   Temp:  (!) 97.5 F (36.4 C) 98.7 F (37.1 C)   TempSrc:  Oral Oral   SpO2: 98% 97% 96% 96%  Weight:      Height:       SpO2: 96 %   Intake/Output Summary (Last 24 hours) at 10/01/2019 1004 Last data filed at 10/01/2019 0353 Gross per 24 hour  Intake 1000 ml  Output 475 ml  Net 525 ml   Filed Weights   09/30/19 2345  Weight: 83.5 kg    Exam: General exam: In no acute distress. Respiratory system: Good air movement and clear to auscultation. Cardiovascular system: S1 & S2 heard, RRR. No JVD. Gastrointestinal system: Abdomen is nondistended, soft and nontender.  Central nervous system: Alert and oriented. No focal neurological deficits. Extremities: No pedal edema. Skin: No rashes, lesions or ulcers Psychiatry: Judgement and insight appear normal. Mood & affect appropriate.   Data Reviewed:    Labs: Basic Metabolic Panel: Recent Labs  Lab 09/30/19 1407 10/01/19 0039  NA 143 142  K 4.2 4.3  CL 105 106  CO2 27 27  GLUCOSE 163* 130*  BUN 23 16  CREATININE 1.00 1.01*  CALCIUM 8.9 8.8*   GFR Estimated Creatinine Clearance: 60.1 mL/min (A) (by C-G formula based on SCr of 1.01 mg/dL (H)). Liver Function Tests: Recent Labs  Lab 09/30/19 1407 10/01/19 0039  AST 16 16  ALT 16 14  ALKPHOS 65 61  BILITOT 0.6 0.8  PROT 7.2 7.1  ALBUMIN 3.7 3.5   Recent Labs  Lab 09/30/19 1407  LIPASE 24   No results for input(s): AMMONIA in the last 168 hours. Coagulation profile No results for input(s): INR, PROTIME in the last 168 hours. COVID-19 Labs  No results for input(s):  DDIMER, FERRITIN, LDH, CRP in the last 72 hours.  No results found for: SARSCOV2NAA  CBC: Recent Labs  Lab 09/30/19 1407 10/01/19 0039  WBC 4.9 5.3  HGB 14.2 14.1  HCT 46.5* 45.9  MCV 90.3 89.3  PLT 259 305   Cardiac Enzymes: No results for input(s): CKTOTAL, CKMB, CKMBINDEX, TROPONINI in the last 168 hours. BNP (last 3 results) No results for input(s): PROBNP in the last 8760 hours. CBG: No results for input(s): GLUCAP in the last 168 hours. D-Dimer: No results for input(s): DDIMER in the last 72 hours. Hgb A1c: No results for input(s): HGBA1C in the last 72 hours. Lipid Profile: No results for input(s): CHOL, HDL, LDLCALC, TRIG, CHOLHDL, LDLDIRECT in the last 72 hours. Thyroid function studies: Recent Labs    10/01/19 0039  TSH 1.431   Anemia work up: No results for input(s): VITAMINB12, FOLATE, FERRITIN, TIBC, IRON, RETICCTPCT in the last 72 hours. Sepsis Labs: Recent Labs  Lab 09/30/19 1407 10/01/19 0039  WBC 4.9 5.3   Microbiology No results found for this or any previous visit (from the past 240 hour(s)).   Medications:   . amLODipine  10 mg Oral Daily  . atorvastatin  40 mg Oral q1800  . feeding supplement (ENSURE ENLIVE)  237 mL Oral BID BM  . lisinopril  20 mg Oral Daily  . sodium chloride flush  3 mL Intravenous Once   Continuous Infusions: . sodium chloride 100 mL/hr at 10/01/19 0034      LOS: 1 day   Charlynne Cousins  [facility]  10/01/2019, 10:04 AM

## 2019-10-01 NOTE — Progress Notes (Signed)
Patient arrived to [id number] from ED on bed accompanied by Gerald Stabs, RN. Patient alert and oriented X4. Only complaint voiced is nausea. Shortly after arrival patient did vomit approximately 250 mls of clear liquid. Hypertensive, no blood pressure medications ordered. MD notified. Received call from lab stating troponin level from 1500 was not drawn in ED. MD notified. Patient oriented to room and call bell system. Telemetry box number 12 verified, currently SR 73. Patient presently resting comfortably. Continue ongoing monitoring and plan of care.

## 2019-10-01 NOTE — H&P (Signed)
History and Physical   Alison Whitehead [id number] DOB: Sep 07, 1950 DOA: 09/30/2019  Referring MD/NP/PA: Dr. Vanita Panda  PCP: Patient, No Pcp Per   Outpatient Specialists: None  Patient coming from: Home  Chief Complaint: Persistent nausea and vomiting with weakness  HPI: Alison Whitehead is a 69 y.o. female with medical history significant of hypertension, degenerative disc disease, IBS who presented with dizziness and nausea.  Patient started having weakness and dizziness today.  She woke up feeling the whole room spinning.  She was lightheaded and almost fell.  She continues to feel bad and then nausea came on with vomiting later on.  She is has some abdominal pain and diarrhea along the line but mainly the vomiting.  She denied any lateralizing symptoms.  She denied any visual changes.  Patient came to the ER where she was evaluated.  No inner ear pain or no recent cold.  No exposure to Covid.  She had work-up done with MRI showing hemorrhagic CVA in the right pons.  Neurology consulted and patient is being admitted for work-up.  She is still symptomatic.  Blood pressure is markedly elevated on arrival.  Currently being treated.  No prior history of CVA.  No tobacco or hyperlipidemia..  ED Course: Temperature is 9078 blood pressure 183/120 pulse 74 respirate 24 oxygen sats 91% room air.  Urinalysis essentially negative.  CT head without contrast shows extensive chronic microvascular ischemic changes.  Shows no acute findings.  MRI of the brain showed acute to subacute hemorrhagic lacunar infarct in the point your right pons.  Also advanced chronic small vessel ischemic disease with multiple chronic lacunar infarcts in the bilateral basal ganglia and thalami.  Neurology consulted and patient is being admitted for work-up.  Her other CBC and chemistries appear to be reasonably okay.  Review of Systems: As per HPI otherwise 10 point review of systems negative.    Past Medical History:    Diagnosis Date  . Arthritis   . DDD (degenerative disc disease)   . Degenerative disc disease, lumbar   . Hypertension   . IBS (irritable bowel syndrome)   . Osteopenia     Past Surgical History:  Procedure Laterality Date  . SHOULDER SURGERY       reports that she has been smoking. She has never used smokeless tobacco. She reports that she does not drink alcohol or use drugs.  Allergies  Allergen Reactions  . Penicillins Itching    Family History  Problem Relation Age of Onset  . Breast cancer Maternal Aunt   . Diabetes Mother   . Hypertension Mother   . Glaucoma Mother   . Diabetes Sister   . Hypertension Sister   . Cancer Sister      Prior to Admission medications   Medication Sig Start Date End Date Taking? Authorizing Provider  amLODipine (NORVASC) 10 MG tablet Take 1 tablet (10 mg total) by mouth daily. 02/24/18   Vanessa Kick, MD  cetirizine (ZYRTEC) 10 MG tablet Take 10 mg by mouth daily.    [provider]  clonazePAM (KLONOPIN) 1 MG tablet Take 1 tablet (1 mg total) by mouth 3 (three) times daily as needed for anxiety. 02/24/18   Vanessa Kick, MD  diclofenac sodium (VOLTAREN) 1 % GEL Apply 2-4 g topically 4 (four) times daily. 09/10/16   Cherylann Ratel, PA-C  dicyclomine (BENTYL) 20 MG tablet Take 20 mg by mouth every 6 (six) hours.    [provider]  HYDROcodone-acetaminophen (NORCO) 10-325 MG tablet  Take 1 tablet by mouth 3 (three) times daily as needed. 02/24/18   Vanessa Kick, MD  ibuprofen (ADVIL,MOTRIN) 800 MG tablet Take 1 tablet (800 mg total) by mouth every 8 (eight) hours as needed. 07/08/16   Cherylann Ratel, PA-C  lisinopril (PRINIVIL,ZESTRIL) 20 MG tablet Take 1 tablet (20 mg total) by mouth daily. 02/24/18   Vanessa Kick, MD  loratadine (CLARITIN) 10 MG tablet Take 10 mg by mouth daily.    [provider]  solifenacin (VESICARE) 5 MG tablet Take 5 mg by mouth daily.    [provider]    Physical  Exam: Vitals:   09/30/19 2230 09/30/19 2300 09/30/19 2345 09/30/19 2351  BP: (!) 149/99 (!) 175/98 (!) 183/82 (!) 183/82  Pulse:   72 (!) 48  Resp: 18 18 18 17   Temp:   97.8 F (36.6 C) 97.8 F (36.6 C)  TempSrc:   Oral Oral  SpO2:   100%   Weight:   83.5 kg   Height:   5\' 7"  (1.702 m)       Constitutional: Acutely ill looking, actively vomiting Vitals:   09/30/19 2230 09/30/19 2300 09/30/19 2345 09/30/19 2351  BP: (!) 149/99 (!) 175/98 (!) 183/82 (!) 183/82  Pulse:   72 (!) 48  Resp: 18 18 18 17   Temp:   97.8 F (36.6 C) 97.8 F (36.6 C)  TempSrc:   Oral Oral  SpO2:   100%   Weight:   83.5 kg   Height:   5\' 7"  (1.702 m)    Eyes: PERRL, lids and conjunctivae normal ENMT: Mucous membranes are moist. Posterior pharynx clear of any exudate or lesions.Normal dentition.  Neck: normal, supple, no masses, no thyromegaly Respiratory: clear to auscultation bilaterally, no wheezing, no crackles. Normal respiratory effort. No accessory muscle use.  Cardiovascular: Regular rate and rhythm, no murmurs / rubs / gallops. No extremity edema. 2+ pedal pulses. No carotid bruits.  Abdomen: no tenderness, no masses palpated. No hepatosplenomegaly. Bowel sounds positive.  Musculoskeletal: no clubbing / cyanosis. No joint deformity upper and lower extremities. Good ROM, no contractures. Normal muscle tone.  Skin: no rashes, lesions, ulcers. No induration Neurologic: CN 2-12 grossly intact. Sensation intact, DTR normal. Strength 5/5 in all 4.  Positive nystagmus Psychiatric: Normal judgment and insight. Alert and oriented x 3. Normal mood.     Labs on Admission: I have personally reviewed following labs and imaging studies  CBC: Recent Labs  Lab 09/30/19 1407  WBC 4.9  HGB 14.2  HCT 46.5*  MCV 90.3  PLT [id number]   Basic Metabolic Panel: Recent Labs  Lab 09/30/19 1407  NA 143  K 4.2  CL 105  CO2 27  GLUCOSE 163*  BUN 23  CREATININE 1.00  CALCIUM 8.9   GFR: Estimated  Creatinine Clearance: 60.7 mL/min (by C-G formula based on SCr of 1 mg/dL). Liver Function Tests: Recent Labs  Lab 09/30/19 1407  AST 16  ALT 16  ALKPHOS 65  BILITOT 0.6  PROT 7.2  ALBUMIN 3.7   Recent Labs  Lab 09/30/19 1407  LIPASE 24   No results for input(s): AMMONIA in the last 168 hours. Coagulation Profile: No results for input(s): INR, PROTIME in the last 168 hours. Cardiac Enzymes: No results for input(s): CKTOTAL, CKMB, CKMBINDEX, TROPONINI in the last 168 hours. BNP (last 3 results) No results for input(s): PROBNP in the last 8760 hours. HbA1C: No results for input(s): HGBA1C in the last 72 hours. CBG: No results  for input(s): GLUCAP in the last 168 hours. Lipid Profile: No results for input(s): CHOL, HDL, LDLCALC, TRIG, CHOLHDL, LDLDIRECT in the last 72 hours. Thyroid Function Tests: No results for input(s): TSH, T4TOTAL, FREET4, T3FREE, THYROIDAB in the last 72 hours. Anemia Panel: No results for input(s): VITAMINB12, FOLATE, FERRITIN, TIBC, IRON, RETICCTPCT in the last 72 hours. Urine analysis:    Component Value Date/Time   COLORURINE YELLOW 09/30/2019 1630   APPEARANCEUR CLEAR 09/30/2019 1630   LABSPEC 1.024 09/30/2019 1630   PHURINE 8.0 09/30/2019 1630   GLUCOSEU NEGATIVE 09/30/2019 1630   HGBUR NEGATIVE 09/30/2019 1630   BILIRUBINUR NEGATIVE 09/30/2019 1630   KETONESUR NEGATIVE 09/30/2019 1630   PROTEINUR NEGATIVE 09/30/2019 1630   NITRITE NEGATIVE 09/30/2019 1630   LEUKOCYTESUR SMALL (A) 09/30/2019 1630   Sepsis Labs: @LABRCNTIP (procalcitonin:4,lacticidven:4) )No results found for this or any previous visit (from the past 240 hour(s)).   Radiological Exams on Admission: CT Head Wo Contrast  Result Date: 09/30/2019 CLINICAL DATA:  Dizziness. EXAM: CT HEAD WITHOUT CONTRAST TECHNIQUE: Contiguous axial images were obtained from the base of the skull through the vertex without intravenous contrast. COMPARISON:  None. FINDINGS: Brain: No evidence  of acute infarction, hemorrhage, hydrocephalus, extra-axial collection or mass lesion/mass effect. Extensive hypoattenuation in the subcortical and periventricular deep white matter consistent with chronic microvascular ischemic change is noted. Vascular: Atherosclerosis. Skull: Intact.  No focal lesion. Sinuses/Orbits: Paranasal sinuses are clear. The patient has a remote fracture of the medial wall of the right orbit and remote nasal bone fractures. Other: None. IMPRESSION: No acute abnormality. Extensive chronic microvascular ischemic change. Atherosclerosis. Remote fractures of the medial wall of the right orbit and nasal bones. Electronically Signed   By: Inge Rise M.D.   On: 09/30/2019 16:13   MR BRAIN WO CONTRAST  Result Date: 09/30/2019 CLINICAL DATA:  Ataxia, stroke suspected. Additional history provided: Dizziness and nausea. EXAM: MRI HEAD WITHOUT CONTRAST TECHNIQUE: Multiplanar, multiecho pulse sequences of the brain and surrounding structures were obtained without intravenous contrast. COMPARISON:  Noncontrast head CT performed earlier the same day 09/30/2019 FINDINGS: Brain: There is a subcentimeter focus of restricted diffusion within the dorsal right pons. Corresponding punctate focus of SWI signal loss at this site (series 7, image 52) (series 14, image 14). Findings are suggestive of an acute/early subacute microhemorrhage or hemorrhagic lacunar infarct. No evidence of acute infarct elsewhere within the brain. No evidence of intracranial mass. No midline shift or extra-axial fluid collection. Severe patchy and confluent T2/FLAIR hyperintensity within the cerebral white matter which is nonspecific, but consistent with chronic small vessel ischemic disease. Multiple chronic lacunar infarcts within the bilateral basal ganglia and thalami. To a lesser degree, there are chronic small-vessel ischemic changes within the pons. There are multiple chronic microhemorrhages within the right basal  ganglia and bilateral thalami. A few chronic microhemorrhages are also present within the posterior cerebral hemispheres. Cerebral volume is normal for age. Vascular: Flow voids maintained within the proximal large arterial vessels. Skull and upper cervical spine: No focal marrow lesion. Sinuses/Orbits: Visualized orbits demonstrate no acute abnormality. Mild scattered paranasal sinus mucosal thickening. Trace fluid within left mastoid air cells. IMPRESSION: 1. Findings consistent with acute/early subacute microhemorrhage or hemorrhagic lacunar infarct in the posterior right pons as described. 2. Advanced chronic small vessel ischemic disease. Multiple chronic lacunar infarcts in the bilateral basal ganglia and thalami. 3. Multiple supratentorial chronic microhemorrhages with a central predominance. 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 4. Trace left mastoid  effusion Electronically Signed   By: Kellie Simmering DO   On: 09/30/2019 21:14   CT Abdomen Pelvis W Contrast  Result Date: 09/30/2019 CLINICAL DATA:  Nausea, vomiting and weakness. EXAM: CT ABDOMEN AND PELVIS WITH CONTRAST TECHNIQUE: Multidetector CT imaging of the abdomen and pelvis was performed using the standard protocol following bolus administration of intravenous contrast. CONTRAST:  121mL OMNIPAQUE IOHEXOL 300 MG/ML  SOLN COMPARISON:  05/20/2016 FINDINGS: Lower chest: Patchy bibasilar infiltrates are noted, left greater than right. No pleural effusions. The heart is enlarged. No pericardial effusion. Moderate tortuosity of the thoracic aorta. The distal esophagus is grossly normal. Hepatobiliary: No focal hepatic lesions or intrahepatic biliary dilatation. The gallbladder is grossly normal. No common bile duct dilatation. Pancreas: No mass, inflammation or ductal dilatation. Spleen: Normal size. No focal lesions. Adrenals/Urinary Tract: The adrenal glands are unremarkable. The kidneys demonstrate  moderate renal cortical scarring type changes but no evidence of acute pyelonephritis or abscess. No hydroureteronephrosis. The bladder is grossly normal. Stomach/Bowel: The stomach, duodenum, small bowel and colon are grossly normal without oral contrast. No acute inflammatory changes, mass lesions or obstructive findings. The terminal ileum and appendix are normal. Fairly extensive descending and sigmoid colon diverticulosis but no findings for acute diverticulitis. Vascular/Lymphatic: The aorta and branch vessels are patent. Mild tortuosity and scattered atherosclerotic calcifications. No aneurysm. Branch vessels are patent. The major venous structures are patent. No mesenteric or retroperitoneal mass or adenopathy. Reproductive: Enlarged fibroid uterus. The ovaries are grossly normal. Other: No pelvic mass or adenopathy. No free pelvic fluid collections. No inguinal mass or adenopathy. Small anterior abdominal wall hernia containing fat. Musculoskeletal: No significant bony findings. IMPRESSION: 1. No acute abdominal/pelvic findings, mass lesions or adenopathy. 2. Bibasilar infiltrates, left greater than right. 3. Enlarged fibroid uterus. 4. Descending and sigmoid colon diverticulosis without findings for acute diverticulitis. 5. Small anterior abdominal wall hernia containing fat. Electronically Signed   By: Marijo Sanes M.D.   On: 09/30/2019 16:38    EKG: Independently reviewed.  Showed normal sinus rhythm with sinus arrhythmia.  Rate of 66.  Evidence of LVH by voltage criteria.  Some flipped T waves in the lateral leads, mildly prolonged QTc interval.  Assessment/Plan Principal Problem:   Acute cerebrovascular accident (CVA) ([place]) Active Problems:   Essential hypertension   Bilateral primary osteoarthritis of knee     #1 acute hemorrhagic infarct: Patient will be admitted to the hospital.  We will focus more on blood pressure control.  Statin initiated.  No aspirin or anticoagulants.  Neurology  consulted.  PT and OT consults as well as speech therapy consult as per stroke team.  #2 essential hypertension: Patient's blood pressure was markedly elevated.  Resume home regiment of lisinopril and amlodipine.  She has responded to those in the ER.  As needed hydralazine plus or minus: Clonidine will be ordered as well.  #3 osteoarthritis: Patient complained of bilateral knee pain but much better otherwise.  #4 abnormal EKG: Most likely as result of longstanding hypertension.  Continue blood pressure control.  We will get an echocardiogram.   DVT prophylaxis: SCD Code Status: Full code Family Communication: No family at bedside Disposition Plan: Home Consults called: Dr. Cheral Marker, neurology Admission status: Inpatient  Severity of Illness: The appropriate patient status for this patient is INPATIENT. Inpatient status is judged to be reasonable and necessary in order to provide the required intensity of service to ensure the patient's safety. The patient's presenting symptoms, physical exam findings, and initial radiographic and laboratory  data in the context of their chronic comorbidities is felt to place them at high risk for further clinical deterioration. Furthermore, it is not anticipated that the patient will be medically stable for discharge from the hospital within 2 midnights of admission. The following factors support the patient status of inpatient.   " The patient's presenting symptoms include dizziness with nausea. " The worrisome physical exam findings include positive nystagmus. " The initial radiographic and laboratory data are worrisome because of MRI of the brain showing hemorrhagic infarct. " The chronic co-morbidities include longstanding hypertension.   * I certify that at the point of admission it is my clinical judgment that the patient will require inpatient hospital care spanning beyond 2 midnights from the point of admission due to high intensity of service, high  risk for further deterioration and high frequency of surveillance required.Barbette Merino MD [facility] Pager [phone]  If 7PM-7AM, please contact night-coverage [email or web address] Password [facility]  10/01/2019, 12:14 AM

## 2019-10-01 NOTE — Progress Notes (Addendum)
STROKE TEAM PROGRESS NOTE   HISTORY OF PRESENT ILLNESS (per record) Alison Whitehead is an 69 y.o. female who presented to the [facility] ED on Friday afternoon with c/c dizziness of new onset, starting while at work. Her dizziness would increase with movement and she also experienced nausea with movement. After she ate something at work, she started vomiting. After vomiting, she had onset of diarrhea and abdominal pain. Her dizziness was described as spinning in conjunction with presyncopal sensation. She was hypertensive in the ED.   At the time of her initial evaluation by the EDP, she denied fever, chills, headache, vision changes, unilateral weakness, chest pain, SOB, cough and urinary symptoms.   An MRI brain was obtained, revealing a subacute right posterior pontine microhemorrhage with a rim of surrounding hyperintense DWI signal.   MRI brain: 1. Findings consistent with acute/early subacute microhemorrhage or hemorrhagic lacunar infarct in the posterior right pons. 2. Advanced chronic small vessel ischemic disease. Multiple chronic lacunar infarcts in the bilateral basal ganglia and thalami. 3. Multiple supratentorial chronic microhemorrhages with a central predominance. 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 4. Trace left mastoid effusion   INTERVAL HISTORY Her nurse is at bedside. She is very dizzy and nauseated. Can;t stand. Does not appear to be orthostatic.Discussed with nurse and attending. Hasn't taken her medication for 10 yeas.     OBJECTIVE Vitals:   10/02/19 0430 10/02/19 0432 10/02/19 0628 10/02/19 0813  BP: (!) 151/104 (!) 155/104 (!) 172/101 139/86  Pulse: 61 66 72 74  Resp: 20 20 19 18   Temp: 97.8 F (36.6 C) 98.2 F (36.8 C)  98.1 F (36.7 C)  TempSrc:  Oral  Oral  SpO2: 100% 99% 98% 95%  Weight:      Height:        CBC:  Recent Labs  Lab 09/30/19 1407 10/01/19 0039  WBC 4.9 5.3  HGB 14.2 14.1  HCT  46.5* 45.9  MCV 90.3 89.3  PLT 259 [id number]    Basic Metabolic Panel:  Recent Labs  Lab 09/30/19 1407 10/01/19 0039  NA 143 142  K 4.2 4.3  CL 105 106  CO2 27 27  GLUCOSE 163* 130*  BUN 23 16  CREATININE 1.00 1.01*  CALCIUM 8.9 8.8*    Lipid Panel:     Component Value Date/Time   CHOL 230 (H) 11/08/2007 2124   TRIG 129 11/08/2007 2124   HDL 69 11/08/2007 2124   CHOLHDL 3.3 Ratio 11/08/2007 2124   VLDL 26 11/08/2007 2124   LDLCALC 135 (H) 11/08/2007 2124   HgbA1c: No results found for: HGBA1C Urine Drug Screen:     Component Value Date/Time   LABOPIA POSITIVE (A) 10/01/2019 1852   COCAINSCRNUR POSITIVE (A) 10/01/2019 1852   COCAINSCRNUR POS (A) 04/20/2008 2013   LABBENZ NONE DETECTED 10/01/2019 1852   LABBENZ NEG 04/20/2008 2013   AMPHETMU NONE DETECTED 10/01/2019 1852   THCU NONE DETECTED 10/01/2019 1852   LABBARB NONE DETECTED 10/01/2019 1852    Alcohol Level No results found for: ETH  IMAGING  CT Head Wo Contrast 09/30/2019 IMPRESSION:  No acute abnormality. Extensive chronic microvascular ischemic change. Atherosclerosis. Remote fractures of the medial wall of the right orbit and nasal bones.   MR BRAIN WO CONTRAST 09/30/2019 IMPRESSION:  1. Findings consistent with acute/early subacute microhemorrhage or hemorrhagic lacunar infarct in the posterior right pons as described.  2. Advanced chronic small vessel ischemic disease. Multiple chronic lacunar infarcts in the bilateral basal  ganglia and thalami.  3. Multiple supratentorial chronic microhemorrhages with a central predominance. 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  4. Trace left mastoid effusion    CT Abdomen Pelvis W Contrast 09/30/2019 IMPRESSION:  1. No acute abdominal/pelvic findings, mass lesions or adenopathy.  2. Bibasilar infiltrates, left greater than right.  3. Enlarged fibroid uterus.  4. Descending and sigmoid colon diverticulosis  without findings for acute diverticulitis.  5. Small anterior abdominal wall hernia containing fat.   Transthoracic Echocardiogram  00/00/2021 Pending  Bilateral Carotid Dopplers  00/00/2021 Pending   ECG - SR rate 66 BPM. (See cardiology reading for complete details)   PHYSICAL EXAM Blood pressure 139/86, pulse 74, temperature 98.1 F (36.7 C), temperature source Oral, resp. rate 18, height 5\' 7"  (1.702 m), weight 83.5 kg, SpO2 95 %.  Exam: NAD, pleasant, on the phone and looks very comfortable gives me a thumbs up                Speech:    Speech is normal; fluent and spontaneous with normal comprehension.  Cognition:    The patient is oriented to person, place, and time;     recent and remote memory intact;     language fluent;    Cranial Nerves:    The pupils are equal, round, and reactive to light.Trigeminal sensation is intact and the muscles of mastication are normal. The face is symmetric. The palate elevates in the midline. Hearing intact. Voice is normal. Shoulder shrug is normal. The tongue has normal motion without fasciculations.   Coordination:  No dysmetria  Motor Observation:    No asymmetry, no atrophy, and no involuntary movements noted. Tone:    Normal muscle tone.     Strength:    Strength is V/V in the upper and lower limbs.      Sensation: intact to LT    ASSESSMENT/PLAN Alison Whitehead is a 69 y.o. female with history of hypertension, previous strokes, tobacco use, and substance abuse history presenting with dizziness, abdominal pain, pre syncope, diarrhea, nausea and vomiting. She did not receive IV t-PA due to  late presentation (>4.5 hours from time of onset).  Stroke:  acute/early subacute microhemorrhage or hemorrhagic lacunar infarct in the posterior right pons - small vessel disease  Code Stroke CT Head - not ordered  CT head - No acute abnormality. Extensive chronic microvascular ischemic change.   MRI head -  Findings  consistent with acute/early subacute microhemorrhage or hemorrhagic lacunar infarct in the posterior right pons as described. Advanced chronic small vessel ischemic disease. Multiple chronic lacunar infarcts in the bilateral basal ganglia and thalami.   MRA head - not ordered  CTA H&N - not ordered  CT Perfusion - not ordered  Carotid Doppler - will order pending  2D Echo - will order pending  Lacey Jensen Virus 2 - negative  LDL - 135  HgbA1c - No results found for requested labs within last 26280 hours.  UDS - not ordered - will order  VTE prophylaxis - SCDs Diet  Diet Order            Diet Heart Room service appropriate? Yes; Fluid consistency: Thin  Diet effective now              No antithrombotic prior to admission, now on No antithrombotic. Recommend starting ASA 325, would not discharge on DUAP due to microhemmorghas and discharge on ASA 81mg  alone. Michrohemmorhages appear hypertensive and not amyloid,  asa 81mg  can still have risk of bleeding but benefits outweight risks due to her white matter/microvascular disease.   Patient counseled to be compliant with her antithrombotic medications  Ongoing aggressive stroke risk factor management  Therapy recommendations:  pending  Disposition:  Pending  Hypertension  Home BP meds: Amlodipine ; Lisinopril  Current BP meds: Amlodipine ; Coreg ; lotensin  Diastolic BP somewhat high at times but within post stroke/TIA parameters . Permissive hypertension (OK if < 220/120) but gradually normalize in 5-7 days . Long-term BP goal normotensive  Hyperlipidemia  Home Lipid lowering medication: none   LDL 135, goal < 70  Current lipid lowering medication: Lipitor 40 mg daily   Continue statin at discharge  Other Stroke Risk Factors  Advanced age  Cigarette smoker - advised to stop smoking  Obesity, Body mass index is 28.83 kg/m., recommend weight loss, diet and exercise as appropriate   Hx  stroke/TIA  Substance Abuse  Other Active Problems  Code status - Full Code  Acute diverticulitis   Bibasilar infiltrates, left greater than right.    Hospital day # 2  Personally examined patient and images, and have participated in and made any corrections needed to history, physical, neuro exam,assessment and plan as stated above.  I have personally obtained the history, evaluated lab date, reviewed imaging studies and agree with radiology interpretations.    Sarina Ill, MD Stroke Neurology   A total of 35 minutes was spent for the care of this patient, spent on counseling patient and family on different diagnostic and therapeutic options, counseling and coordination of care, riskd ans benefits of management, compliance, or risk factor reduction and education.   To contact Stroke Continuity provider, please refer to [email or web address] After hours, contact General Neurology

## 2019-10-01 NOTE — Progress Notes (Signed)
Patient has not voided since arrival to [facility]. Patient reports urge to void but can not void. Bladder scan performed, 500 mls noted. Dr. Osa Craver notified, awaiting response back.

## 2019-10-02 ENCOUNTER — Inpatient Hospital Stay (HOSPITAL_COMMUNITY): Payer: Medicare HMO

## 2019-10-02 DIAGNOSIS — I639 Cerebral infarction, unspecified: Secondary | ICD-10-CM

## 2019-10-02 DIAGNOSIS — I6389 Other cerebral infarction: Secondary | ICD-10-CM

## 2019-10-02 DIAGNOSIS — M170 Bilateral primary osteoarthritis of knee: Secondary | ICD-10-CM

## 2019-10-02 LAB — HEMOGLOBIN A1C
Hgb A1c MFr Bld: 6.2 % — ABNORMAL HIGH (ref 4.8–5.6)
Mean Plasma Glucose: 131.24 mg/dL

## 2019-10-02 LAB — ECHOCARDIOGRAM COMPLETE
Height: 67 in
Weight: 2945.35 oz

## 2019-10-02 MED ORDER — HYDROCHLOROTHIAZIDE 12.5 MG PO CAPS
12.5000 mg | ORAL_CAPSULE | Freq: Every day | ORAL | Status: DC
  Administered 2019-10-02 – 2019-10-06 (×5): 12.5 mg via ORAL
  Filled 2019-10-02 (×5): qty 1

## 2019-10-02 NOTE — Progress Notes (Signed)
   10/02/19 1347  Clinical Encounter Type  Visited With Patient and family together  Visit Type Follow-up  Referral From Nurse  Consult/Referral To Chaplain  Spiritual Encounters  Spiritual Needs Emotional;Prayer  Stress Factors  Patient Stress Factors Health changes;Major life changes  Family Stress Factors Major life changes   Chaplain responded to consult for emotional support and prayer. Aracelis was happy to have [place] visit. Chaplain offered ministry of presence and prayer. Chaplains remain available for support as needs arise.   Chaplain Resident, Evelene Croon, M Div [phone] on-call pager

## 2019-10-02 NOTE — Progress Notes (Signed)
Carotid duplex has been completed.   Preliminary results in CV Proc.   Abram Sander 10/02/2019 10:08 AM

## 2019-10-02 NOTE — Progress Notes (Signed)
Rehab Admissions Coordinator Note:  Per PT recommendation, this patient was screened by Raechel Ache for appropriateness for an Inpatient Acute Rehab Consult.  At this time, we are recommending an Inpatient Rehab consult.  AC will place consult order per protocol.    Raechel Ache 10/02/2019, 4:26 PM  I can be reached at [phone].

## 2019-10-02 NOTE — Progress Notes (Signed)
[facility] PROGRESS NOTE    Progress Note  Alison Whitehead  [id number] DOB: 10/26/1950 DOA: 09/30/2019 PCP: Patient, No Pcp Per     Brief Narrative:   Alison Whitehead is an 69 y.o. female past medical history significant for hypertension, DJD IBS who presents with dizziness and nausea started on the day of admission she relates she woke up in the room was spinning around her and almost fell to the ground became nauseated and vomiting.  MRI of the brain showed acute/subacute early microhemorrhage or hemorrhagic lacunar infarct in the posterior pons, chronic small vessel ischemia with multiple lacunar infarcts in the basal ganglia bilaterally and thalamus.  She also had multiple supratentorial chronic microhemorrhagic changes question hypertensive microangiopathy.  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  They recommended to keep her goal blood pressure less than 140/80. Awaiting physical therapy recommendations her blood pressure this morning is 138/86 was started on low-dose hydrochlorothiazide, continue Norvasc, lisinopril and Coreg. Carotid Dopplers was sent results. 2D echo is pending.  LDL was greater than 100 she is on a statin.    Essential hypertension: Her blood pressure is improved but she still complaining of dizziness, continue Coreg lisinopril a diuretic.  Bilateral primary osteoarthritis of knee Continue tramadol for pain avoid ibuprofen.  Abnormal EKG: Likely due to longstanding uncontrolled hypertension, 2D echo is pending.    DVT prophylaxis: lovenox Family Communication:none Disposition Plan/Barrier to D/C: Awaiting physical therapy evaluation.  Will probably need to go to skilled nursing facility.  Code Status:     Code Status Orders  (From admission, onward)        Start     Ordered   10/01/19 0011  Full code  Continuous     10/01/19 0012        Code Status History    This patient has a current code status but no historical code status.   Advance Care Planning Activity        IV Access:    Peripheral IV   Procedures and diagnostic studies:   CT Head Wo Contrast  Result Date: 09/30/2019 CLINICAL DATA:  Dizziness. EXAM: CT HEAD WITHOUT CONTRAST TECHNIQUE: Contiguous axial images were obtained from the base of the skull through the vertex without intravenous contrast. COMPARISON:  None. FINDINGS: Brain: No evidence of acute infarction, hemorrhage, hydrocephalus, extra-axial collection or mass lesion/mass effect. Extensive hypoattenuation in the subcortical and periventricular deep white matter consistent with chronic microvascular ischemic change is noted. Vascular: Atherosclerosis. Skull: Intact.  No focal lesion. Sinuses/Orbits: Paranasal sinuses are clear. The patient has a remote fracture of the medial wall of the right orbit and remote nasal bone fractures. Other: None. IMPRESSION: No acute abnormality. Extensive chronic microvascular ischemic change. Atherosclerosis. Remote fractures of the medial wall of the right orbit and nasal bones. Electronically Signed   By: Inge Rise M.D.   On: 09/30/2019 16:13   MR BRAIN WO CONTRAST  Result Date: 09/30/2019 CLINICAL DATA:  Ataxia, stroke suspected. Additional history provided: Dizziness and nausea. EXAM: MRI HEAD WITHOUT CONTRAST TECHNIQUE: Multiplanar, multiecho pulse sequences of the brain and surrounding structures were obtained without intravenous contrast. COMPARISON:  Noncontrast head CT performed earlier the same day 09/30/2019 FINDINGS: Brain: There is a subcentimeter focus of restricted diffusion within the dorsal right pons. Corresponding punctate focus of SWI signal loss at this site (series 7, image 52) (series 14, image 14). Findings  are suggestive of an acute/early subacute  microhemorrhage or hemorrhagic lacunar infarct. No evidence of acute infarct elsewhere within the brain. No evidence of intracranial mass. No midline shift or extra-axial fluid collection. Severe patchy and confluent T2/FLAIR hyperintensity within the cerebral white matter which is nonspecific, but consistent with chronic small vessel ischemic disease. Multiple chronic lacunar infarcts within the bilateral basal ganglia and thalami. To a lesser degree, there are chronic small-vessel ischemic changes within the pons. There are multiple chronic microhemorrhages within the right basal ganglia and bilateral thalami. A few chronic microhemorrhages are also present within the posterior cerebral hemispheres. Cerebral volume is normal for age. Vascular: Flow voids maintained within the proximal large arterial vessels. Skull and upper cervical spine: No focal marrow lesion. Sinuses/Orbits: Visualized orbits demonstrate no acute abnormality. Mild scattered paranasal sinus mucosal thickening. Trace fluid within left mastoid air cells. IMPRESSION: 1. Findings consistent with acute/early subacute microhemorrhage or hemorrhagic lacunar infarct in the posterior right pons as described. 2. Advanced chronic small vessel ischemic disease. Multiple chronic lacunar infarcts in the bilateral basal ganglia and thalami. 3. Multiple supratentorial chronic microhemorrhages with a central predominance. 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 4. Trace left mastoid effusion Electronically Signed   By: Kellie Simmering DO   On: 09/30/2019 21:14   CT Abdomen Pelvis W Contrast  Result Date: 09/30/2019 CLINICAL DATA:  Nausea, vomiting and weakness. EXAM: CT ABDOMEN AND PELVIS WITH CONTRAST TECHNIQUE: Multidetector CT imaging of the abdomen and pelvis was performed using the standard protocol following bolus administration of intravenous contrast. CONTRAST:  157mL OMNIPAQUE IOHEXOL 300 MG/ML  SOLN  COMPARISON:  05/20/2016 FINDINGS: Lower chest: Patchy bibasilar infiltrates are noted, left greater than right. No pleural effusions. The heart is enlarged. No pericardial effusion. Moderate tortuosity of the thoracic aorta. The distal esophagus is grossly normal. Hepatobiliary: No focal hepatic lesions or intrahepatic biliary dilatation. The gallbladder is grossly normal. No common bile duct dilatation. Pancreas: No mass, inflammation or ductal dilatation. Spleen: Normal size. No focal lesions. Adrenals/Urinary Tract: The adrenal glands are unremarkable. The kidneys demonstrate moderate renal cortical scarring type changes but no evidence of acute pyelonephritis or abscess. No hydroureteronephrosis. The bladder is grossly normal. Stomach/Bowel: The stomach, duodenum, small bowel and colon are grossly normal without oral contrast. No acute inflammatory changes, mass lesions or obstructive findings. The terminal ileum and appendix are normal. Fairly extensive descending and sigmoid colon diverticulosis but no findings for acute diverticulitis. Vascular/Lymphatic: The aorta and branch vessels are patent. Mild tortuosity and scattered atherosclerotic calcifications. No aneurysm. Branch vessels are patent. The major venous structures are patent. No mesenteric or retroperitoneal mass or adenopathy. Reproductive: Enlarged fibroid uterus. The ovaries are grossly normal. Other: No pelvic mass or adenopathy. No free pelvic fluid collections. No inguinal mass or adenopathy. Small anterior abdominal wall hernia containing fat. Musculoskeletal: No significant bony findings. IMPRESSION: 1. No acute abdominal/pelvic findings, mass lesions or adenopathy. 2. Bibasilar infiltrates, left greater than right. 3. Enlarged fibroid uterus. 4. Descending and sigmoid colon diverticulosis without findings for acute diverticulitis. 5. Small anterior abdominal wall hernia containing fat. Electronically Signed   By: Marijo Sanes M.D.   On:  09/30/2019 16:38     Medical Consultants:    None.  Anti-Infectives:   None  Subjective:    Thereasa Parkin she relates she has no appetite she continues to have dizziness.  Objective:    Vitals:   10/02/19 0430 10/02/19 0432 10/02/19 0628 10/02/19 0813  BP: Marland Kitchen)  151/104 (!) 155/104 (!) 172/101 139/86  Pulse: 61 66 72 74  Resp: 20 20 19 18   Temp: 97.8 F (36.6 C) 98.2 F (36.8 C)  98.1 F (36.7 C)  TempSrc:  Oral  Oral  SpO2: 100% 99% 98% 95%  Weight:      Height:       SpO2: 95 %   Intake/Output Summary (Last 24 hours) at 10/02/2019 0936 Last data filed at 10/02/2019 0419 Gross per 24 hour  Intake 1840 ml  Output 200 ml  Net 1640 ml   Filed Weights   09/30/19 2345  Weight: 83.5 kg    Exam: General exam: In no acute distress. Respiratory system: Good air movement and clear to auscultation. Cardiovascular system: S1 & S2 heard, RRR. No JVD. Gastrointestinal system: Abdomen is nondistended, soft and nontender.  Central nervous system: Alert and oriented. No focal neurological deficits. Psychiatry: Judgement and insight appear normal. Mood & affect appropriate.  Data Reviewed:    Labs: Basic Metabolic Panel: Recent Labs  Lab 09/30/19 1407 10/01/19 0039  NA 143 142  K 4.2 4.3  CL 105 106  CO2 27 27  GLUCOSE 163* 130*  BUN 23 16  CREATININE 1.00 1.01*  CALCIUM 8.9 8.8*   GFR Estimated Creatinine Clearance: 60.1 mL/min (A) (by C-G formula based on SCr of 1.01 mg/dL (H)). Liver Function Tests: Recent Labs  Lab 09/30/19 1407 10/01/19 0039  AST 16 16  ALT 16 14  ALKPHOS 65 61  BILITOT 0.6 0.8  PROT 7.2 7.1  ALBUMIN 3.7 3.5   Recent Labs  Lab 09/30/19 1407  LIPASE 24   No results for input(s): AMMONIA in the last 168 hours. Coagulation profile No results for input(s): INR, PROTIME in the last 168 hours. COVID-19 Labs  No results for input(s): DDIMER, FERRITIN, LDH, CRP in the last 72 hours.  No results found for:  SARSCOV2NAA  CBC: Recent Labs  Lab 09/30/19 1407 10/01/19 0039  WBC 4.9 5.3  HGB 14.2 14.1  HCT 46.5* 45.9  MCV 90.3 89.3  PLT 259 305   Cardiac Enzymes: No results for input(s): CKTOTAL, CKMB, CKMBINDEX, TROPONINI in the last 168 hours. BNP (last 3 results) No results for input(s): PROBNP in the last 8760 hours. CBG: No results for input(s): GLUCAP in the last 168 hours. D-Dimer: No results for input(s): DDIMER in the last 72 hours. Hgb A1c: No results for input(s): HGBA1C in the last 72 hours. Lipid Profile: No results for input(s): CHOL, HDL, LDLCALC, TRIG, CHOLHDL, LDLDIRECT in the last 72 hours. Thyroid function studies: Recent Labs    10/01/19 0039  TSH 1.431   Anemia work up: No results for input(s): VITAMINB12, FOLATE, FERRITIN, TIBC, IRON, RETICCTPCT in the last 72 hours. Sepsis Labs: Recent Labs  Lab 09/30/19 1407 10/01/19 0039  WBC 4.9 5.3   Microbiology No results found for this or any previous visit (from the past 240 hour(s)).   Medications:   . amLODipine  10 mg Oral Daily  . aspirin  325 mg Oral Daily  . atorvastatin  40 mg Oral q1800  . carvedilol  3.125 mg Oral BID WC  . feeding supplement (ENSURE ENLIVE)  237 mL Oral BID BM  . lisinopril  20 mg Oral Daily  . loratadine  10 mg Oral Daily  . multivitamin with minerals  1 tablet Oral Daily  . polyethylene glycol  17 g Oral Daily   Continuous Infusions: . sodium chloride 10 mL/hr at 10/01/19 1041  LOS: 2 days   Charlynne Cousins  [facility]  10/02/2019, 9:36 AM

## 2019-10-02 NOTE — Progress Notes (Signed)
STROKE TEAM PROGRESS NOTE   HISTORY OF PRESENT ILLNESS (per record) Alison Whitehead is an 69 y.o. female who presented to the [facility] ED on Friday afternoon with c/c dizziness of new onset, starting while at work. Her dizziness would increase with movement and she also experienced nausea with movement. After she ate something at work, she started vomiting. After vomiting, she had onset of diarrhea and abdominal pain. Her dizziness was described as spinning in conjunction with presyncopal sensation. She was hypertensive in the ED.   At the time of her initial evaluation by the EDP, she denied fever, chills, headache, vision changes, unilateral weakness, chest pain, SOB, cough and urinary symptoms.   An MRI brain was obtained, revealing a subacute right posterior pontine microhemorrhage with a rim of surrounding hyperintense DWI signal.   MRI brain: 1. Findings consistent with acute/early subacute microhemorrhage or hemorrhagic lacunar infarct in the posterior right pons. 2. Advanced chronic small vessel ischemic disease. Multiple chronic lacunar infarcts in the bilateral basal ganglia and thalami. 3. Multiple supratentorial chronic microhemorrhages with a central predominance. 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 4. Trace left mastoid effusion   INTERVAL HISTORY Her nurse is at bedside. She is very dizzy and nauseated but improving, discussed with patient that this is common with the location of her stroke and will subside. Discussed with nurse and attending. Hasn't taken any of her needed medication for 10 years.+Cocaine and opiates.    OBJECTIVE Vitals:   10/02/19 0430 10/02/19 0432 10/02/19 0628 10/02/19 0813  BP: (!) 151/104 (!) 155/104 (!) 172/101 139/86  Pulse: 61 66 72 74  Resp: 20 20 19 18   Temp: 97.8 F (36.6 C) 98.2 F (36.8 C)  98.1 F (36.7 C)  TempSrc:  Oral  Oral  SpO2: 100% 99% 98% 95%  Weight:      Height:         CBC:  Recent Labs  Lab 09/30/19 1407 10/01/19 0039  WBC 4.9 5.3  HGB 14.2 14.1  HCT 46.5* 45.9  MCV 90.3 89.3  PLT 259 [id number]    Basic Metabolic Panel:  Recent Labs  Lab 09/30/19 1407 10/01/19 0039  NA 143 142  K 4.2 4.3  CL 105 106  CO2 27 27  GLUCOSE 163* 130*  BUN 23 16  CREATININE 1.00 1.01*  CALCIUM 8.9 8.8*    Lipid Panel:     Component Value Date/Time   CHOL 230 (H) 11/08/2007 2124   TRIG 129 11/08/2007 2124   HDL 69 11/08/2007 2124   CHOLHDL 3.3 Ratio 11/08/2007 2124   VLDL 26 11/08/2007 2124   LDLCALC 135 (H) 11/08/2007 2124   HgbA1c: No results found for: HGBA1C Urine Drug Screen:     Component Value Date/Time   LABOPIA POSITIVE (A) 10/01/2019 1852   COCAINSCRNUR POSITIVE (A) 10/01/2019 1852   COCAINSCRNUR POS (A) 04/20/2008 2013   LABBENZ NONE DETECTED 10/01/2019 1852   LABBENZ NEG 04/20/2008 2013   AMPHETMU NONE DETECTED 10/01/2019 1852   THCU NONE DETECTED 10/01/2019 1852   LABBARB NONE DETECTED 10/01/2019 1852    Alcohol Level No results found for: ETH  IMAGING  CT Head Wo Contrast 09/30/2019 IMPRESSION:  No acute abnormality. Extensive chronic microvascular ischemic change. Atherosclerosis. Remote fractures of the medial wall of the right orbit and nasal bones.   MR BRAIN WO CONTRAST 09/30/2019 IMPRESSION:  1. Findings consistent with acute/early subacute microhemorrhage or hemorrhagic lacunar infarct in the posterior right pons as described.  2. Advanced chronic small vessel ischemic disease. Multiple chronic lacunar infarcts in the bilateral basal ganglia and thalami.  3. Multiple supratentorial chronic microhemorrhages with a central predominance. 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  4. Trace left mastoid effusion   CT Abdomen Pelvis W Contrast 09/30/2019 IMPRESSION:  1. No acute abdominal/pelvic findings, mass lesions or adenopathy.  2. Bibasilar infiltrates, left greater  than right.  3. Enlarged fibroid uterus.  4. Descending and sigmoid colon diverticulosis without findings for acute diverticulitis.  5. Small anterior abdominal wall hernia containing fat.   Transthoracic Echocardiogram  10/02/2019 IMPRESSIONS: 1. Left ventricular ejection fraction, by estimation, is 55 to 60%. The  left ventricle has normal function. The left ventricle has no regional  wall motion abnormalities. The left ventricular internal cavity size was  severely dilated. Left ventricular  diastolic parameters are consistent with Grade I diastolic dysfunction  (impaired relaxation). Elevated left ventricular end-diastolic pressure.  2. Right ventricular systolic function is normal. The right ventricular  size is normal. There is normal pulmonary artery systolic pressure.  3. The mitral valve is normal in structure and function. Trivial mitral  valve regurgitation. No evidence of mitral stenosis.  4. The aortic valve is tricuspid. Aortic valve regurgitation is trivial.  Mild to moderate aortic valve sclerosis/calcification is present, without  any evidence of aortic stenosis.  5. Aortic dilatation noted. 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  6. The inferior vena cava is normal in size with greater than 50%  respiratory variability, suggesting right atrial pressure of 3 mmHg.   Bilateral Carotid Dopplers  10/02/2019 Summary:  Right Carotid: Velocities in the right ICA are consistent with a 1-39% stenosis.  Left Carotid: Velocities in the left ICA are consistent with a 1-39% stenosis.  Vertebrals: Bilateral vertebral arteries demonstrate antegrade flow.  Preliminary  ECG - SR rate 66 BPM. (See cardiology reading for complete details)   PHYSICAL EXAM Blood pressure 139/86, pulse 74, temperature 98.1 F (36.7 C), temperature source Oral, resp. rate 18, height 5\' 7"  (1.702 m), weight 83.5 kg, SpO2 95 %.  Exam: NAD, pleasant, on the phone and  looks very comfortable gives me a thumbs up                Speech:    Speech is normal; fluent and spontaneous with normal comprehension.  Cognition:    The patient is oriented to person, place, and time;     recent and remote memory intact;     language fluent;    Cranial Nerves:    The pupils are equal, round, and reactive to light.Trigeminal sensation is intact and the muscles of mastication are normal. The face is symmetric. The palate elevates in the midline. Hearing intact. Voice is normal. Shoulder shrug is normal. The tongue has normal motion without fasciculations.   Coordination:  No dysmetria  Motor Observation:    No asymmetry, no atrophy, and no involuntary movements noted. Tone:    Normal muscle tone.     Strength:    Strength is V/V in the upper and lower limbs.      Sensation: intact to LT    ASSESSMENT/PLAN Ms. Alison Whitehead is a 69 y.o. female with history of hypertension, previous strokes, tobacco use, and substance abuse history presenting with dizziness, abdominal pain, pre syncope, diarrhea, nausea and vomiting. She did not receive IV t-PA due to  late presentation (>4.5 hours from time of onset).  Stroke:  acute/early subacute microhemorrhage or hemorrhagic lacunar infarct in the posterior right pons - small vessel disease  Code Stroke CT Head - not ordered  CT head - No acute abnormality. Extensive chronic microvascular ischemic change.   MRI head -  Findings consistent with acute/early subacute microhemorrhage or hemorrhagic lacunar infarct in the posterior right pons as described. Advanced chronic small vessel ischemic disease. Multiple chronic lacunar infarcts in the bilateral basal ganglia and thalami.   MRA head - not ordered  CTA H&N - not ordered  CT Perfusion - not ordered  Carotid Doppler - unremarkable  2D Echo - EF 55 - 60%. No cardiac source of emboli identified.   Sars Corona Virus 2 - negative  LDL - 135  HgbA1c -  pending  UDS - positive for cocaine and opiates  VTE prophylaxis - SCDs Diet  Diet Order            Diet Heart Room service appropriate? Yes; Fluid consistency: Thin  Diet effective now              No antithrombotic prior to admission.  Recommend discharging on ASA 81mg . Given bleed I would not recommend DUAP however would still recommend ASA 81mg  due to her prior lacunar strokes and significant microvascular disease and stroke risk. Discussed wit patient. She understands risks of being on ASA.   Patient counseled to be compliant with her antithrombotic medications  Ongoing aggressive stroke risk factor management  Therapy recommendations:  CIR recommended  Disposition:  Pending  Hypertension  Home BP meds: Amlodipine ; Lisinopril  Current BP meds: Amlodipine ; Coreg ; lotensin  Diastolic BP somewhat high at times but within post stroke/TIA parameters . Permissive hypertension (OK if < 220/120) but gradually normalize in 5-7 days . Long-term BP goal normotensive  Hyperlipidemia  Home Lipid lowering medication: none   LDL 135, goal < 70  Current lipid lowering medication: Lipitor 40 mg daily   Continue statin at discharge  Other Stroke Risk Factors  Advanced age  Cigarette smoker - advised to stop smoking  Obesity, Body mass index is 28.83 kg/m., recommend weight loss, diet and exercise as appropriate   Hx stroke/TIA  Substance Abuse  Other Active Problems  Code status - Full Code  Acute diverticulitis - afebrile - normal WBCs  Bibasilar infiltrates, left greater than right.    Hospital day # 2  Personally examined patient and images, and have participated in and made any corrections needed to history, physical, neuro exam,assessment and plan as stated above.  I have personally obtained the history, evaluated lab date, reviewed imaging studies and agree with radiology interpretations.   A total of 15 minutes was spent for the care of this  patient, spent on counseling patient and family on different diagnostic and therapeutic options, counseling and coordination of care, riskd ans benefits of management, compliance, or risk factor reduction and education.  Stroke will sign off  To contact Stroke Continuity provider, please refer to [email or web address] After hours, contact General Neurology

## 2019-10-02 NOTE — Evaluation (Addendum)
Physical Therapy Evaluation Patient Details Name: Alison Whitehead MRN: [id number] DOB: 01-08-1951 Today's Date: 10/02/2019   History of Present Illness  Alison Whitehead is an 69 y.o. female past medical history significant for hypertension, DJD IBS who presents with dizziness and nausea. MRI of the brain showed acute/subacute early microhemorrhage or hemorrhagic lacunar infarct in the posterior pons, chronic small vessel ischemia with multiple lacunar infarcts in the basal ganglia bilaterally and thalamus.  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 Pt requiring moderate assist (+2 safety) for basic stand pivot transfers. Demonstrates dynamic balance impairments. Further mobility limited due to pt report of constant dizziness and nausea. Sensation only relieved with lying supine. Pt also exhibits impulsive behaviors and decreased attention. Recommending post acute rehab to address deficits and suspect good progress based on PLOF and motivation.     Follow Up Recommendations CIR    Equipment Recommendations  Other (comment)(TBA)    Recommendations for Other Services       Precautions / Restrictions Precautions Precautions: Fall Restrictions Weight Bearing Restrictions: No      Mobility  Bed Mobility Overal bed mobility: Needs Assistance Bed Mobility: Sit to Supine;Supine to Sit     Supine to sit: Min guard Sit to supine: Min guard      Transfers Overall transfer level: Needs assistance Equipment used: None Transfers: Sit to/from [facility] Sit to Stand: Min assist;+2 safety/equipment Stand pivot transfers: Mod assist;+2 safety/equipment       General transfer comment: MinA to rise from [facility], modA to pivot towards left. Modest instability and increased trunk flexion  Ambulation/Gait             General Gait Details: deferred by pt  Stairs            Wheelchair Mobility    Modified Rankin  (Stroke Patients Only) Modified Rankin (Stroke Patients Only) Pre-Morbid Rankin Score: No symptoms Modified Rankin: Moderately severe disability     Balance Overall balance assessment: Needs assistance Sitting-balance support: Feet supported Sitting balance-Leahy Scale: Fair     Standing balance support: No upper extremity supported;During functional activity Standing balance-Leahy Scale: Poor                               Pertinent Vitals/Pain Pain Assessment: No/denies pain    Home Living Family/patient expects to be discharged to:: Private residence Living Arrangements: Alone Available Help at Discharge: Family;Available PRN/intermittently(daughters, sister) Type of Home: Apartment Home Access: Level entry     Home Layout: One level        Prior Function Level of Independence: Independent         Comments: Works as caregiver     Journalist, newspaper        Extremity/Trunk Assessment   Upper Extremity Assessment Upper Extremity Assessment: RUE deficits/detail;LUE deficits/detail RUE Deficits / Details: Strength 5/5 LUE Deficits / Details: Strength 5/5    Lower Extremity Assessment Lower Extremity Assessment: RLE deficits/detail;LLE deficits/detail RLE Deficits / Details: Strength 5/5 LLE Deficits / Details: Strength 5/5    Cervical / Trunk Assessment Cervical / Trunk Assessment: Normal  Communication   Communication: No difficulties  Cognition Arousal/Alertness: Awake/alert Behavior During Therapy: Restless;Impulsive Overall Cognitive Status: Impaired/Different from baseline Area of Impairment: Attention;Memory                   Current Attention Level: Sustained Memory: Decreased short-term memory  General Comments: Pt impulsive, extremely distractable, demonstrating almost manic behaviors. Needs frequent cues for attending to task and for safety. Also potential STM deficits as she asks multiple times if I was a PT or  OT but then that could be a component of her decreased attention span as well.      General Comments      Exercises     Assessment/Plan    PT Assessment Patient needs continued PT services  PT Problem List Decreased activity tolerance;Decreased balance;Decreased mobility;Decreased cognition;Decreased safety awareness       PT Treatment Interventions Gait training;Stair training;Functional mobility training;Therapeutic activities;Therapeutic exercise;DME instruction;Balance training;Patient/family education    PT Goals (Current goals can be found in the Care Plan section)  Acute Rehab PT Goals Patient Stated Goal: less dizziness PT Goal Formulation: With patient Time For Goal Achievement: 10/16/19 Potential to Achieve Goals: Good    Frequency Min 4X/week   Barriers to discharge        Co-evaluation               AM-PAC PT "6 Clicks" Mobility  Outcome Measure Help needed turning from your back to your side while in a flat bed without using bedrails?: None Help needed moving from lying on your back to sitting on the side of a flat bed without using bedrails?: A Little Help needed moving to and from a bed to a chair (including a wheelchair)?: A Lot Help needed standing up from a chair using your arms (e.g., wheelchair or bedside chair)?: A Little Help needed to walk in hospital room?: A Lot Help needed climbing 3-5 steps with a railing? : Total 6 Click Score: 15    End of Session   Activity Tolerance: Other (comment)(limited by dizziness/nausea) Patient left: in bed;with call bell/phone within reach;with bed alarm set Nurse Communication: Mobility status PT Visit Diagnosis: Unsteadiness on feet (R26.81);Difficulty in walking, not elsewhere classified (R26.2);Other symptoms and signs involving the nervous system (R29.898)    Time: [id number] PT Time Calculation (min) (ACUTE ONLY): 17 min   Charges:   PT Evaluation $PT Eval Moderate Complexity: Burnett, PT, DPT Acute Rehabilitation Services Pager [phone] Office [phone]   Deno Etienne 10/02/2019, 1:33 PM

## 2019-10-02 NOTE — Progress Notes (Addendum)
Patient had a cardiac event on 10/02/19 @ 00:27 where pt went brady with pulse of 27.  Telemonitor saved stripped and notified RN that she also had an event earlier today for the same thing.

## 2019-10-02 NOTE — Progress Notes (Signed)
  Echocardiogram 2D Echocardiogram has been performed.  Alison Whitehead 10/02/2019, 10:58 AM

## 2019-10-03 DIAGNOSIS — I639 Cerebral infarction, unspecified: Secondary | ICD-10-CM

## 2019-10-03 MED ORDER — ISOSORB DINITRATE-HYDRALAZINE 20-37.5 MG PO TABS
1.0000 | ORAL_TABLET | Freq: Three times a day (TID) | ORAL | Status: DC
  Administered 2019-10-03 – 2019-10-06 (×10): 1 via ORAL
  Filled 2019-10-03 (×13): qty 1

## 2019-10-03 MED ORDER — PROCHLORPERAZINE EDISYLATE 10 MG/2ML IJ SOLN
10.0000 mg | Freq: Four times a day (QID) | INTRAMUSCULAR | Status: AC | PRN
  Administered 2019-10-03: 10 mg via INTRAVENOUS
  Filled 2019-10-03: qty 2

## 2019-10-03 MED ORDER — CARVEDILOL 6.25 MG PO TABS
6.2500 mg | ORAL_TABLET | Freq: Two times a day (BID) | ORAL | Status: DC
  Administered 2019-10-03 – 2019-10-06 (×6): 6.25 mg via ORAL
  Filled 2019-10-03 (×6): qty 1

## 2019-10-03 MED ORDER — MECLIZINE HCL 12.5 MG PO TABS
12.5000 mg | ORAL_TABLET | Freq: Two times a day (BID) | ORAL | Status: DC | PRN
  Administered 2019-10-04 – 2019-10-05 (×3): 12.5 mg via ORAL
  Filled 2019-10-03 (×3): qty 1

## 2019-10-03 NOTE — Evaluation (Signed)
Clinical/Bedside Swallow Evaluation Patient Details  Name: Sayra Foco MRN: [id number] Date of Birth: 02/23/1951  Today's Date: 10/03/2019 Time: SLP Start Time (ACUTE ONLY): 1000 SLP Stop Time (ACUTE ONLY): 1010 SLP Time Calculation (min) (ACUTE ONLY): 10 min  Past Medical History:  Past Medical History:  Diagnosis Date  . Arthritis   . DDD (degenerative disc disease)   . Degenerative disc disease, lumbar   . Hypertension   . IBS (irritable bowel syndrome)   . Osteopenia    Past Surgical History:  Past Surgical History:  Procedure Laterality Date  . SHOULDER SURGERY     HPI:  Cateria Fleegle is an 69 y.o. female past medical history significant for hypertension, DJD IBS who presents with dizziness and nausea. MRI of the brain showed acute/subacute early microhemorrhage or hemorrhagic lacunar infarct in the posterior pons, chronic small vessel ischemia with multiple lacunar infarcts in the basal ganglia bilaterally and thalamus.   Assessment / Plan / Recommendation Clinical Impression   Patient seen for bedside swallowing evaluation. She presents with swallow function that is grossly WFL, recommend regular solids/thin liquids.  Oral cavity exam unremarkable, patient with dentures upper, edentulous lower. She reports she used to have lower dentures, but has lost them. Pt able to feed self, demonstrating some mild impulsivity with increased rate of intake. Pt seen with thin liquids via cup and straw sip: adequate oral acceptance, adequate AP transport and swallow initiation appeared to be timely. No overt s/sx aspiration. Patient seen with puree solids, no overt s/sx aspiration. With solids (graham cracker and soft bagel): pt with minimally prolonged mastication 2/2 dentition, adequate bolus manipulation and formation, adequate AP transport. No overt s/sx aspiration. Adequate oral clearance achieved.  No further ST services for dysphagia are warranted at this time. Please  re-consult should new needs arise.   SLP Visit Diagnosis: Dysphagia, unspecified (R13.10)    Aspiration Risk       Diet Recommendation Regular;Thin liquid        Other  Recommendations Oral Care Recommendations: Oral care BID   Follow up Recommendations        Frequency and Duration            Prognosis        Swallow Study   General HPI: Miguelina Fratangelo is an 69 y.o. female past medical history significant for hypertension, DJD IBS who presents with dizziness and nausea. MRI of the brain showed acute/subacute early microhemorrhage or hemorrhagic lacunar infarct in the posterior pons, chronic small vessel ischemia with multiple lacunar infarcts in the basal ganglia bilaterally and thalamus. Type of Study: Bedside Swallow Evaluation Diet Prior to this Study: Regular;Thin liquids Temperature Spikes Noted: No Respiratory Status: Room air History of Recent Intubation: Yes Behavior/Cognition: Impulsive;Alert Oral Cavity Assessment: Within Functional Limits Oral Cavity - Dentition: Dentures, top Vision: Functional for self-feeding Self-Feeding Abilities: Able to feed self Patient Positioning: Upright in bed Baseline Vocal Quality: Normal Volitional Cough: Strong    Oral/Motor/Sensory Function Overall Oral Motor/Sensory Function: Within functional limits   Ice Chips Ice chips: Not tested   Thin Liquid Thin Liquid: Within functional limits    Nectar Thick Nectar Thick Liquid: Not tested   Honey Thick     Puree Puree: Within functional limits   Solid     Solid: Within functional limits      Annabella Elford 10/03/2019,10:42 AM   Marina Goodell, M.Ed., [facility] Acute Rehabilitation [phone]: Acute Rehab office [phone] - pager

## 2019-10-03 NOTE — Plan of Care (Signed)
Plan of care reviewed with pt at bedside. Call bell in reach. Pt cooperative. Safety measures in place. Will continue to monitor. Pt up in chair.  Problem: Education: Goal: Knowledge of General Education information will improve Description: Including pain rating scale, medication(s)/side effects and non-pharmacologic comfort measures Outcome: Progressing   Problem: Health Behavior/Discharge Planning: Goal: Ability to manage health-related needs will improve Outcome: Progressing   Problem: Clinical Measurements: Goal: Ability to maintain clinical measurements within normal limits will improve Outcome: Progressing Goal: Will remain free from infection Outcome: Progressing Goal: Diagnostic test results will improve Outcome: Progressing Goal: Respiratory complications will improve Outcome: Progressing Goal: Cardiovascular complication will be avoided Outcome: Progressing   Problem: Activity: Goal: Risk for activity intolerance will decrease Outcome: Progressing   Problem: Nutrition: Goal: Adequate nutrition will be maintained Outcome: Progressing   Problem: Coping: Goal: Level of anxiety will decrease Outcome: Progressing   Problem: Elimination: Goal: Will not experience complications related to bowel motility Outcome: Progressing Goal: Will not experience complications related to urinary retention Outcome: Progressing   Problem: Pain Managment: Goal: General experience of comfort will improve Outcome: Progressing   Problem: Safety: Goal: Ability to remain free from injury will improve Outcome: Progressing   Problem: Skin Integrity: Goal: Risk for impaired skin integrity will decrease Outcome: Progressing   Problem: Education: Goal: Knowledge of disease or condition will improve Outcome: Progressing Goal: Knowledge of secondary prevention will improve Outcome: Progressing Goal: Knowledge of patient specific risk factors addressed and post discharge goals  established will improve Outcome: Progressing Goal: Individualized Educational Video(s) Outcome: Progressing   Problem: Coping: Goal: Will verbalize positive feelings about self Outcome: Progressing   Problem: Health Behavior/Discharge Planning: Goal: Ability to manage health-related needs will improve Outcome: Progressing   Problem: Self-Care: Goal: Ability to participate in self-care as condition permits will improve Outcome: Progressing   Problem: Ischemic Stroke/TIA Tissue Perfusion: Goal: Complications of ischemic stroke/TIA will be minimized Outcome: Progressing

## 2019-10-03 NOTE — Evaluation (Signed)
Occupational Therapy Evaluation Patient Details Name: Alison Whitehead MRN: [id number] DOB: [birth year] Today's Date: 10/03/2019    History of Present Illness Alison Whitehead is an 69 y.o. female past medical history significant for hypertension, DJD IBS who presents with dizziness and nausea. MRI of the brain showed acute/subacute early microhemorrhage or hemorrhagic lacunar infarct in the posterior pons, chronic small vessel ischemia with multiple lacunar infarcts in the basal ganglia bilaterally and thalamus.   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 Pt currently requiring Min Guard A for UB ADLs, Min A for LB ADLs, and Min A for functional mobility with RW. Pt presenting with decreased balance, safety, and awareness of deficits. Pt at high risk for falls and requiring Min A throughout for balance. Pt highly distractible and requiring cues throughout for safety and attention. Pt would benefit from further acute OT to facilitate safe dc. Recommend dc to CIR for intensive OT to optimize safety, independence with ADLs, and return to PLOF.      Follow Up Recommendations  CIR;Supervision/Assistance - 24 hour    Equipment Recommendations  3 in 1 bedside commode;Other (comment)(RW)    Recommendations for Other Services PT consult     Precautions / Restrictions Precautions Precautions: Fall Restrictions Weight Bearing Restrictions: No      Mobility Bed Mobility Overal bed mobility: Needs Assistance Bed Mobility: Supine to Sit     Supine to sit: Min guard     General bed mobility comments: Min Guard A for safety  Transfers Overall transfer level: Needs assistance Equipment used: Rolling walker (2 wheeled) Transfers: Sit to/from [facility] Sit to Stand: Min assist;+2 safety/equipment         General transfer comment: Cues for hand placement to and from seated surface.  Present with  pulling on RW into standing and poor eccentric loading returning to seated surface.    Balance Overall balance assessment: Needs assistance Sitting-balance support: Feet supported Sitting balance-Leahy Scale: Fair     Standing balance support: No upper extremity supported;During functional activity Standing balance-Leahy Scale: Poor                             ADL either performed or assessed with clinical judgement   ADL Overall ADL's : Needs assistance/impaired Eating/Feeding: Set up;Sitting   Grooming: Min guard;Minimal assistance;Standing;Wash/dry hands Grooming Details (indicate cue type and reason): Min Guard A for safety while performing hand hygiene. Min A for correcting balance and preventing falls Upper Body Bathing: Min guard;Sitting   Lower Body Bathing: Minimal assistance;Sit to/from stand   Upper Body Dressing : Min guard;Sitting   Lower Body Dressing: Minimal assistance;Sit to/from stand   Toilet Transfer: Minimal assistance;+2 for safety/equipment;Ambulation;RW;BSC Toilet Transfer Details (indicate cue type and reason): Min A to power up from [facility] over toilet Toileting- Clothing Manipulation and Hygiene: Minimal assistance;Sit to/from stand Toileting - Clothing Manipulation Details (indicate cue type and reason): Min A for balance and gown management     Functional mobility during ADLs: Minimal assistance;Rolling walker General ADL Comments: Pt presenting with decreased balance, coorindation, cognition, and safety. Pt very distractable.      Vision         Perception     Praxis      Pertinent Vitals/Pain Pain Assessment: No/denies pain     Hand Dominance Right   Extremity/Trunk Assessment Upper Extremity Assessment Upper Extremity Assessment: Overall WFL for  tasks assessed   Lower Extremity Assessment Lower Extremity Assessment: Defer to PT evaluation   Cervical / Trunk Assessment Cervical / Trunk Assessment: Normal    Communication Communication Communication: No difficulties   Cognition Arousal/Alertness: Awake/alert Behavior During Therapy: Impulsive Overall Cognitive Status: Impaired/Different from baseline Area of Impairment: Attention;Safety/judgement;Problem solving;Awareness;Following commands                   Current Attention Level: Sustained Memory: Decreased short-term memory Following Commands: Follows one step commands with increased time;Follows multi-step commands inconsistently Safety/Judgement: Decreased awareness of safety;Decreased awareness of deficits Awareness: Intellectual Problem Solving: Slow processing;Requires verbal cues;Requires tactile cues General Comments: Pt impulsive, extremely distractable, demonstrating almost manic behaviors. Needs frequent cues for attending to task and for safety.   General Comments  BP supine 149/92 and sitting EOB 152/116 (pt moving RUE and requiring cues for maintaining RUE still)    Exercises     Shoulder Instructions      Home Living Family/patient expects to be discharged to:: Private residence Living Arrangements: Alone Available Help at Discharge: Family;Available PRN/intermittently(daughters, sister) Type of Home: Apartment Home Access: Level entry     Home Layout: One level     Bathroom Shower/Tub: Teacher, early years/pre: Standard            Lives With: Alone    Prior Functioning/Environment Level of Independence: Independent        Comments: Works as Scientist, research (medical) Problem List: Decreased strength;Decreased activity tolerance;Impaired balance (sitting and/or standing);Decreased knowledge of use of DME or AE;Decreased knowledge of precautions;Decreased safety awareness      OT Treatment/Interventions: Self-care/ADL training;Therapeutic exercise;Energy conservation;DME and/or AE instruction;Therapeutic activities;Patient/family education    OT Goals(Current goals can be found in  the care plan section) Acute Rehab OT Goals Patient Stated Goal: Go home OT Goal Formulation: With patient Time For Goal Achievement: 10/17/19 Potential to Achieve Goals: Good  OT Frequency: Min 2X/week   Barriers to D/C: Decreased caregiver support  Pt reporting no support       Co-evaluation PT/OT/SLP Co-Evaluation/Treatment: Yes Reason for Co-Treatment: Complexity of the patient's impairments (multi-system involvement);For patient/therapist safety;To address functional/ADL transfers PT goals addressed during session: Mobility/safety with mobility OT goals addressed during session: ADL's and self-care      AM-PAC OT "6 Clicks" Daily Activity     Outcome Measure Help from another person eating meals?: None Help from another person taking care of personal grooming?: A Little Help from another person toileting, which includes using toliet, bedpan, or urinal?: A Little Help from another person bathing (including washing, rinsing, drying)?: A Lot Help from another person to put on and taking off regular upper body clothing?: A Little Help from another person to put on and taking off regular lower body clothing?: A Lot 6 Click Score: 17   End of Session Equipment Utilized During Treatment: Rolling walker;Gait belt Nurse Communication: Mobility status  Activity Tolerance: Patient tolerated treatment well Patient left: in chair;with call bell/phone within reach;with chair alarm set  OT Visit Diagnosis: Unsteadiness on feet (R26.81);Other abnormalities of gait and mobility (R26.89);Muscle weakness (generalized) (M62.81)                Time: [id number] OT Time Calculation (min): 26 min Charges:  OT General Charges $OT Visit: 1 Visit OT Evaluation $OT Eval Moderate Complexity: [place], OTR/L Acute Rehab Pager: [phone] Office: [place] 10/03/2019, 3:30 PM

## 2019-10-03 NOTE — Progress Notes (Addendum)
Inpatient Rehab Admissions:  Inpatient Rehab Consult received.  I met with patient at the bedside for rehabilitation assessment and to discuss goals and expectations of an inpatient rehab admission.  She is open to the idea of post acute rehab, but states she does not have anyone to support her at discharge. When asked about her sister (who is retired) she states that she knows that is not an option, and all of her children worked.  I asked her if I could call her family to discuss but she declined.  She is open to SNF.  I let Bronson Curb, CM, know.   Addendum: Spoke with rehab MD, Dr. Ranell Patrick, who feels pt can reach mod I level and will not need 24/7 support at discharge.  Met with pt again, and she is very hopeful for CIR pending insurance authorization.   Signed: Shann Medal, PT, DPT Admissions Coordinator [phone] 10/03/19  1:52 PM

## 2019-10-03 NOTE — Care Management Important Message (Signed)
Important Message  Patient Details  Name: Alison Whitehead MRN: [id number] Date of Birth: 07/08/1951   Medicare Important Message Given:  Yes     Driana Dazey Montine Circle 10/03/2019, 12:50 PM

## 2019-10-03 NOTE — Progress Notes (Signed)
Physical Therapy Treatment Patient Details Name: Alison Whitehead MRN: [id number] DOB: Mar 06, 1951 Today's Date: 10/03/2019    History of Present Illness Alison Whitehead is an 69 y.o. female past medical history significant for hypertension, DJD IBS who presents with dizziness and nausea. MRI of the brain showed acute/subacute early microhemorrhage or hemorrhagic lacunar infarct in the posterior pons, chronic small vessel ischemia with multiple lacunar infarcts in the basal ganglia bilaterally and thalamus.    PT Comments    Pt supine in bed on arrival with OT present asking evaluation questions.  Pt is very determined to mobilize and "show" staff that she can do things.  She continues to benefit from skilled rehab in a post acute setting before returning home.  Pt is very unsteady and presented with multiple LOB during gt training.  She remains to benefit from intensive therapies at CIR.      Follow Up Recommendations  CIR     Equipment Recommendations  Other (comment)(TBA)    Recommendations for Other Services       Precautions / Restrictions Precautions Precautions: Fall Restrictions Weight Bearing Restrictions: No    Mobility  Bed Mobility Overal bed mobility: Needs Assistance Bed Mobility: Sit to Supine;Supine to Sit     Supine to sit: Min guard     General bed mobility comments: To come to sitting edge of bed.  Transfers Overall transfer level: Needs assistance Equipment used: Rolling walker (2 wheeled) Transfers: Sit to/from [facility] Sit to Stand: Min assist;+2 safety/equipment         General transfer comment: Cues for hand placement to and from seated surface.  Present with pulling on RW into standing and poor eccentric loading returning to seated surface.  Ambulation/Gait Ambulation/Gait assistance: Mod assist;+2 safety/equipment Gait Distance (Feet): 120 Feet Assistive device: Rolling walker (2 wheeled) Gait  Pattern/deviations: Step-through pattern;Staggering right;Ataxic;Trunk flexed;Scissoring;Narrow base of support     General Gait Details: Pt required assistance to maintain and correct multiple LOB.  Presents with narrow BOS with cues to increase bOS.  Pt also noted with scissoring and pushing device too far forward.   Pt is slow to mobilize and extremely distracted throughout.   Stairs             Wheelchair Mobility    Modified Rankin (Stroke Patients Only) Modified Rankin (Stroke Patients Only) Pre-Morbid Rankin Score: No symptoms Modified Rankin: Moderately severe disability     Balance Overall balance assessment: Needs assistance Sitting-balance support: Feet supported Sitting balance-Leahy Scale: Fair       Standing balance-Leahy Scale: Poor                              Cognition Arousal/Alertness: Awake/alert Behavior During Therapy: Impulsive Overall Cognitive Status: Impaired/Different from baseline Area of Impairment: Attention;Safety/judgement;Problem solving                   Current Attention Level: Sustained     Safety/Judgement: Decreased awareness of safety;Decreased awareness of deficits   Problem Solving: Slow processing;Requires verbal cues;Requires tactile cues General Comments: Pt impulsive, extremely distractable, demonstrating almost manic behaviors. Needs frequent cues for attending to task and for safety.      Exercises      General Comments        Pertinent Vitals/Pain Pain Assessment: No/denies pain    Home Living       Type of Home: Apartment  Prior Function            PT Goals (current goals can now be found in the care plan section) Acute Rehab PT Goals Patient Stated Goal: less dizziness Potential to Achieve Goals: Good Progress towards PT goals: Progressing toward goals    Frequency    Min 4X/week      PT Plan Current plan remains appropriate    Co-evaluation  PT/OT/SLP Co-Evaluation/Treatment: Yes Reason for Co-Treatment: Complexity of the patient's impairments (multi-system involvement) PT goals addressed during session: Mobility/safety with mobility OT goals addressed during session: ADL's and self-care      AM-PAC PT "6 Clicks" Mobility   Outcome Measure  Help needed turning from your back to your side while in a flat bed without using bedrails?: None Help needed moving from lying on your back to sitting on the side of a flat bed without using bedrails?: A Little Help needed moving to and from a bed to a chair (including a wheelchair)?: A Lot Help needed standing up from a chair using your arms (e.g., wheelchair or bedside chair)?: A Little Help needed to walk in hospital room?: A Lot Help needed climbing 3-5 steps with a railing? : Total 6 Click Score: 15    End of Session Equipment Utilized During Treatment: Gait belt Activity Tolerance: Patient tolerated treatment well Patient left: in bed;with call bell/phone within reach;with bed alarm set Nurse Communication: Mobility status PT Visit Diagnosis: Unsteadiness on feet (R26.81);Difficulty in walking, not elsewhere classified (R26.2);Other symptoms and signs involving the nervous system (R29.898)     Time: [id number] PT Time Calculation (min) (ACUTE ONLY): 21 min  Charges:  $Gait Training: 8-22 mins                     Erasmo Leventhal , PTA Acute Rehabilitation Services Pager [phone] Office [phone]     Alison Whitehead 10/03/2019, 12:54 PM

## 2019-10-03 NOTE — Consult Note (Signed)
Physical Medicine and Rehabilitation Consult   Reason for Consult: Stroke with functional decline.  Referring Physician: Dr. Olevia Bowens   HPI: Alison Whitehead is a 69 y.o. female with history of chronic bilateral knee DJD, lumbar spondylosis,  IBS, cocaine abuse who was admitted on 10/01/19 with weakness, dizziness followed by nausea and vomiting. Patient with history of HTN --no meds x 10 years and UDS positive for opiates and cocaine. 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 CT abdomen/pelvis showed enlarged fibroid uterus with fairly extensive diverticulosis and patchy bibasilar infiltrates. 2D echo showed EF 55-60% with severe dilatation of LV and mild dilation of ascending aorta- 4.3 cm. Carotid dopplers were negative for significant ICA stenosis. Stroke felt to be due to small vessel disease and low dose ASA recommended for secondary stroke prevention. Therapy evaluation showed deficits in higher level cognitive tasks, decreased attention with impulsivity and unsafe behaviors, balance deficits and ongoing vestibular symptoms.  CIR recommended due to functional decline.    Review of Systems  Constitutional: Negative for chills and fever.  HENT: Negative for hearing loss.   Eyes: Negative for blurred vision and double vision.  Respiratory: Negative for cough and shortness of breath.   Cardiovascular: Negative for chest pain and palpitations.  Gastrointestinal: Positive for abdominal pain (due to hernia), constipation, heartburn, nausea and vomiting.  Genitourinary: Negative for dysuria and urgency.  Musculoskeletal: Positive for back pain, joint pain and myalgias.  Skin: Negative for rash.  Neurological: Positive for dizziness, weakness  and headaches.  Psychiatric/Behavioral: The patient is not nervous/anxious and does not have insomnia.      Past Medical History:  Diagnosis Date  . Arthritis   . DDD (degenerative disc disease)   . Degenerative disc disease, lumbar   . Hypertension   . IBS (irritable bowel syndrome)   . Osteopenia     Past Surgical History:  Procedure Laterality Date  . SHOULDER SURGERY      Family History  Problem Relation Age of Onset  . Breast cancer Maternal Aunt   . Diabetes Mother   . Hypertension Mother   . Glaucoma Mother   . Diabetes Sister   . Hypertension Sister   . Cancer Sister     Social History:  Lives alone and independent PTA. 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  She reports that she has been smoking. She has never used smokeless tobacco. She reports that she does not drink alcohol or use drugs.    Allergies  Allergen Reactions  . Penicillins Itching    Medications Prior to Admission  Medication Sig Dispense Refill  . amlodipine-benazepril (LOTREL) 2.5-10 MG capsule Take 1 capsule by mouth daily.    . diclofenac sodium (VOLTAREN) 1 % GEL Apply 2-4 g topically 4 (four) times daily. (Patient taking differently: Apply 2-4 g topically 4 (four) times daily as needed (pain). ) 500 g 1  . HYDROcodone-acetaminophen (NORCO) 10-325 MG tablet Take 1 tablet by mouth 3 (three) times daily as needed. (Patient taking differently: Take 1 tablet by mouth 3 (three) times daily as needed for moderate pain. ) 90 tablet 0  . lisinopril (PRINIVIL,ZESTRIL) 20 MG tablet Take 1 tablet (20 mg total) by mouth daily. 30 tablet 1  . loratadine (CLARITIN) 10 MG tablet Take 10 mg by mouth daily.    Marland Kitchen  metoprolol succinate (TOPROL-XL) 25 MG 24 hr tablet Take 25 mg by mouth daily.    Marland Kitchen amLODipine (NORVASC) 10 MG tablet Take 1 tablet (10 mg total) by mouth daily. (Patient not taking: Reported on 10/01/2019) 30 tablet 1  . clonazePAM (KLONOPIN) 1 MG tablet Take 1 tablet (1 mg total) by mouth 3 (three) times  daily as needed for anxiety. (Patient not taking: Reported on 10/01/2019) 90 tablet 0  . ibuprofen (ADVIL,MOTRIN) 800 MG tablet Take 1 tablet (800 mg total) by mouth every 8 (eight) hours as needed. (Patient not taking: Reported on 10/01/2019) 30 tablet 0    Home: Home Living Family/patient expects to be discharged to:: Private residence Living Arrangements: Alone Available Help at Discharge: Family, Available PRN/intermittently(daughters, sister) Type of Home: Apartment Home Access: Level entry Home Layout: One level Bathroom Shower/Tub: Tub/shower unit  Lives With: Alone  Functional History: Prior Function Level of Independence: Independent Comments: Works as Environmental consultant Status:  Mobility: Bed Mobility Overal bed mobility: Needs Assistance Bed Mobility: Sit to Supine, Supine to Sit Supine to sit: Min guard Sit to supine: Min guard Transfers Overall transfer level: Needs assistance Equipment used: None Transfers: Sit to/from Stand, [employer] Transfers Sit to Stand: Min assist, +2 safety/equipment Stand pivot transfers: Mod assist, +2 safety/equipment General transfer comment: MinA to rise from [facility], modA to pivot towards left. Modest instability and increased trunk flexion Ambulation/Gait General Gait Details: deferred by pt    ADL:    Cognition: Cognition Overall Cognitive Status: Impaired/Different from baseline Arousal/Alertness: Awake/alert Orientation Level: Oriented X4 Attention: Sustained Sustained Attention: Impaired Sustained Attention Impairment: Verbal complex, Functional basic Memory: Impaired Memory Impairment: Decreased short term memory Decreased Short Term Memory: Verbal basic Awareness: Impaired Executive Function: Self Monitoring Self Monitoring: Impaired Behaviors: Impulsive Cognition Arousal/Alertness: Awake/alert Behavior During Therapy: Restless, Impulsive Overall Cognitive Status: Impaired/Different from baseline Area of  Impairment: Attention, Memory Current Attention Level: Sustained Memory: Decreased short-term memory General Comments: Pt impulsive, extremely distractable, demonstrating almost manic behaviors. Needs frequent cues for attending to task and for safety. Also potential STM deficits as she asks multiple times if I was a PT or OT but then that could be a component of her decreased attention span as well.   Blood pressure (!) 177/114, pulse 69, temperature 98 F (36.7 C), temperature source Oral, resp. rate 16, height 5\' 7"  (1.702 m), weight 83.5 kg, SpO2 98 %.  Physical Exam  Nursing note and vitals reviewed. General: Alert and oriented x 3, No apparent distress HEENT: Head is normocephalic, atraumatic, PERRLA, EOMI, sclera anicteric, oral mucosa pink and moist, dentition intact, ext ear canals clear,  Neck: Supple without JVD or lymphadenopathy Heart: Reg rate and rhythm. No murmurs rubs or gallops Chest: CTA bilaterally without wheezes, rales, or rhonchi; no distress Abdomen: Soft, non-tender, non-distended, bowel sounds positive. Extremities: No clubbing, cyanosis, or edema. Pulses are 2+ Skin: Clean and intact without signs of breakdown Neuro: Pt is cognitively appropriate with normal insight and awareness. Impaired immediate and delayed recall. Cranial nerves 2-12 are intact. Sensory exam is normal. Reflexes are 2+ in all 4's.  No tremors. Motor function is grossly 5/5.  MSK: Tenderness to palpation in bilateral knees.  Psych: Pt's affect is appropriate. Pt is cooperative  Results for orders placed or performed during the hospital encounter of 09/30/19 (from the past 24 hour(s))  Hemoglobin A1c     Status: Abnormal   Collection Time: 10/02/19  2:58 PM  Result Value Ref Range   Hgb  A1c MFr Bld 6.2 (H) 4.8 - 5.6 %   Mean Plasma Glucose 131.24 mg/dL   ECHOCARDIOGRAM COMPLETE  Result Date: 10/02/2019    ECHOCARDIOGRAM REPORT   Patient Name:   [facility] Date of Exam: 10/02/2019  Medical Rec #:  [id number]            Height:       67.0 in Accession #:    [id number]           Weight:       184.1 lb Date of Birth:  1951/07/09           BSA:          1.952 m Patient Age:    3 years             BP:           139/86 mmHg Patient Gender: F                    HR:           70 bpm. Exam Location:  Inpatient Procedure: 2D Echo Indications:    stroke 434.91  History:        Patient has no prior history of Echocardiogram examinations.                 Risk Factors:Hypertension.  Sonographer:    Johny Chess Referring Phys: [place]  1. Left ventricular ejection fraction, by estimation, is 55 to 60%. The left ventricle has normal function. The left ventricle has no regional wall motion abnormalities. The left ventricular internal cavity size was severely dilated. Left ventricular diastolic parameters are consistent with Grade I diastolic dysfunction (impaired relaxation). Elevated left ventricular end-diastolic pressure.  2. Right ventricular systolic function is normal. The right ventricular size is normal. There is normal pulmonary artery systolic pressure.  3. The mitral valve is normal in structure and function. Trivial mitral valve regurgitation. No evidence of mitral stenosis.  4. The aortic valve is tricuspid. Aortic valve regurgitation is trivial. Mild to moderate aortic valve sclerosis/calcification is present, without any evidence of aortic stenosis.  5. Aortic dilatation noted. 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  6. The inferior vena cava is normal in size with greater than 50% respiratory variability, suggesting right atrial pressure of 3 mmHg. FINDINGS  Left Ventricle: Left ventricular ejection fraction, by estimation, is 55 to 60%. The left ventricle has normal function. The left ventricle has no regional wall motion abnormalities. The left ventricular internal cavity size was severely dilated. There is no left ventricular  hypertrophy. Left ventricular diastolic parameters are consistent with Grade I diastolic dysfunction (impaired relaxation). Elevated left ventricular end-diastolic pressure. Right Ventricle: The right ventricular size is normal. No increase in right ventricular wall thickness. Right ventricular systolic function is normal. There is normal pulmonary artery systolic pressure. The tricuspid regurgitant velocity is 2.47 m/s, and  with an assumed right atrial pressure of 3 mmHg, the estimated right ventricular systolic pressure is [id number]. Left Atrium: Left atrial size was normal in size. Right Atrium: Right atrial size was normal in size. Pericardium: There is no evidence of pericardial effusion. Mitral Valve: The mitral valve is normal in structure and function. Normal mobility of the mitral valve leaflets. Trivial mitral valve regurgitation. No evidence of mitral valve stenosis. Tricuspid Valve: The tricuspid valve is normal in structure. Tricuspid valve regurgitation is trivial. No evidence of tricuspid stenosis. Aortic  Valve: The aortic valve is tricuspid. . There is moderate thickening and moderate calcification of the aortic valve. Aortic valve regurgitation is trivial. Mild to moderate aortic valve sclerosis/calcification is present, without any evidence of aortic stenosis. There is moderate thickening of the aortic valve. There is moderate calcification of the aortic valve. Pulmonic Valve: The pulmonic valve was normal in structure. Pulmonic valve regurgitation is not visualized. No evidence of pulmonic stenosis. Aorta: The aortic root is normal in size and structure and aortic dilatation noted. 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 Venous: The inferior vena cava is normal in size with greater than 50% respiratory variability, suggesting right atrial pressure of 3 mmHg. IAS/Shunts: The interatrial septum appears to be lipomatous. No atrial level shunt detected by color flow Doppler.   LEFT VENTRICLE PLAX 2D LVIDd:         5.70 cm  Diastology LVIDs:         4.20 cm  LV e' lateral:   7.83 cm/s LV PW:         1.30 cm  LV E/e' lateral: 11.6 LV IVS:        1.10 cm  LV e' medial:    5.11 cm/s LVOT diam:     2.10 cm  LV E/e' medial:  17.8 LV SV:         65 LV SV Index:   33 LVOT Area:     3.46 cm  RIGHT VENTRICLE RV S prime:     11.60 cm/s TAPSE (M-mode): 1.7 cm LEFT ATRIUM             Index       RIGHT ATRIUM           Index LA diam:        4.60 cm 2.36 cm/m  RA Area:     15.50 cm LA Vol (A2C):   49.5 ml 25.36 ml/m RA Volume:   36.90 ml  18.90 ml/m LA Vol (A4C):   44.1 ml 22.59 ml/m LA Biplane Vol: 49.4 ml 25.30 ml/m  AORTIC VALVE LVOT Vmax:   86.80 cm/s LVOT Vmean:  54.700 cm/s LVOT VTI:    0.187 m  AORTA Ao Root diam: 3.00 cm Ao Asc diam:  4.30 cm MITRAL VALVE               TRICUSPID VALVE MV Area (PHT): 4.39 cm    TR Peak grad:   24.4 mmHg MV Decel Time: 173 msec    TR Vmax:        247.00 cm/s MV E velocity: 90.80 cm/s MV A velocity: 99.40 cm/s  SHUNTS MV E/A ratio:  0.91        Systemic VTI:  0.19 m                            Systemic Diam: 2.10 cm Fransico Him MD Electronically signed by Fransico Him MD Signature Date/Time: 10/02/2019/11:16:07 AM    Final    VAS US CAROTID  Result Date: 10/02/2019 Carotid Arterial Duplex Study Indications:       CVA. Risk Factors:      Hypertension. Comparison Study:  no prior Performing Technologist: Abram Sander RVS  Examination Guidelines: A complete evaluation includes B-mode imaging, spectral Doppler, color Doppler, and power Doppler as needed of all accessible portions of each vessel. Bilateral testing is considered an integral part of a complete examination. Limited examinations for reoccurring  indications may be performed as noted.  Right Carotid Findings: +----------+--------+--------+--------+------------------+--------+           PSV cm/sEDV cm/sStenosisPlaque DescriptionComments  +----------+--------+--------+--------+------------------+--------+ CCA Prox  68      16              heterogenous               +----------+--------+--------+--------+------------------+--------+ CCA Distal50      17              heterogenous               +----------+--------+--------+--------+------------------+--------+ ICA Prox  40      20      1-39%   heterogenous               +----------+--------+--------+--------+------------------+--------+ ICA Distal26      10                                         +----------+--------+--------+--------+------------------+--------+ ECA       73      11                                         +----------+--------+--------+--------+------------------+--------+ +----------+--------+-------+--------+-------------------+           PSV cm/sEDV cmsDescribeArm Pressure (mmHG) +----------+--------+-------+--------+-------------------+ [id number]                                         +----------+--------+-------+--------+-------------------+ +---------+--------+--+--------+--+---------+ VertebralPSV cm/s39EDV cm/s12Antegrade +---------+--------+--+--------+--+---------+  Left Carotid Findings: +----------+--------+--------+--------+------------------+--------+           PSV cm/sEDV cm/sStenosisPlaque DescriptionComments +----------+--------+--------+--------+------------------+--------+ CCA Prox  60      19              heterogenous               +----------+--------+--------+--------+------------------+--------+ CCA Distal45      16              heterogenous               +----------+--------+--------+--------+------------------+--------+ ICA Prox  43      20      1-39%   heterogenous               +----------+--------+--------+--------+------------------+--------+ ICA Distal38      17                                          +----------+--------+--------+--------+------------------+--------+ ECA       48      10                                         +----------+--------+--------+--------+------------------+--------+ +----------+--------+--------+--------+-------------------+           PSV cm/sEDV cm/sDescribeArm Pressure (mmHG) +----------+--------+--------+--------+-------------------+ [id number]                                          +----------+--------+--------+--------+-------------------+ +---------+--------+--+--------+-+---------+  VertebralPSV cm/s29EDV cm/s8Antegrade +---------+--------+--+--------+-+---------+   Summary: Right Carotid: Velocities in the right ICA are consistent with a 1-39% stenosis. Left Carotid: Velocities in the left ICA are consistent with a 1-39% stenosis. Vertebrals: Bilateral vertebral arteries demonstrate antegrade flow. *See table(s) above for measurements and observations.     Preliminary     Assessment/Plan: Diagnosis: Impaired mobility and ADLs secondary to multiple microhemorrhages 1. Does the need for close, 24 hr/day medical supervision in concert with the patient's rehab needs make it unreasonable for this patient to be served in a less intensive setting? Yes 2. Co-Morbidities requiring supervision/potential complications: essnetial HTN, bilateral primary OA of knees 3. Due to bladder management, bowel management, safety, skin/wound care, disease management, medication administration, pain management and patient education, does the patient require 24 hr/day rehab nursing? Yes 4. Does the patient require coordinated care of a physician, rehab nurse, therapy disciplines of PT, OT, SLP to address physical and functional deficits in the context of the above medical diagnosis(es)? Yes Addressing deficits in the following areas: balance, endurance, locomotion, strength, transferring, bowel/bladder control, bathing, dressing, feeding, grooming, toileting,  cognition and psychosocial support 5. Can the patient actively participate in an intensive therapy program of at least 3 hrs of therapy per day at least 5 days per week? Yes 6. The potential for patient to make measurable gains while on inpatient rehab is excellent 7. Anticipated functional outcomes upon discharge from inpatient rehab are modified independent  with PT, modified independent with OT, modified independent with SLP. 8. Estimated rehab length of stay to reach the above functional goals is: 10-14 days 9. Anticipated discharge destination: Home 10. Overall Rehab/Functional Prognosis: excellent  RECOMMENDATIONS: This patient's condition is appropriate for continued rehabilitative care in the following setting: CIR Patient has agreed to participate in recommended program. Yes Note that insurance prior authorization may be required for reimbursement for recommended care.  Comment: Mrs. Wierman would be an excellent CIR candidate. She has no family support and goals would be ModI. Currently ambulating 120 feet ModA with unsteadiness, dizziness, and impulsivity. Very motivated and cognition mostly intact with the exception of impaired memory. Currently requiring IV Zofran.  Consider scopolamine patch for better control of nausea and dizziness.   Thank you for this consult.   Bary Leriche, PA-C 10/03/2019   I have personally performed a face to face diagnostic evaluation, including, but not limited to relevant history and physical exam findings, of this patient and developed relevant assessment and plan.  Additionally, I have reviewed and concur with the physician assistant's documentation above.  Leeroy Cha, MD

## 2019-10-03 NOTE — Progress Notes (Addendum)
[facility] PROGRESS NOTE    Progress Note  Alison Whitehead  [id number] DOB: 1951/01/24 DOA: 09/30/2019 PCP: Patient, No Pcp Per     Brief Narrative:   Alison Whitehead is an 69 y.o. female past medical history significant for hypertension, DJD IBS who presents with dizziness and nausea started on the day of admission she relates she woke up in the room was spinning around her and almost fell to the ground became nauseated and vomiting.  MRI of the brain showed acute/subacute early microhemorrhage or hemorrhagic lacunar infarct in the posterior pons, chronic small vessel ischemia with multiple lacunar infarcts in the basal ganglia bilaterally and thalamus.  She also had multiple supratentorial chronic microhemorrhagic changes question hypertensive microangiopathy.  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  They recommended to keep her goal blood pressure less than 140/80. Blood pressure this morning extremely high continue hydrochlorothiazide, Norvasc, lisinopril and Coreg we will add BiDil. Carotid Dopplers show bilateral ICA stenosis less than 39%. 2D echo showed an ejection fraction of 55% with grade 1 diastolic heart failure.  Systolic function was normal.  Currently on a statin.  Essential hypertension: Her blood pressure is improved but she still complaining of dizziness, continue Coreg lisinopril a diuretic.  Bilateral primary osteoarthritis of knee: Continue tramadol for pain avoid ibuprofen.  Abnormal EKG: Likely due to longstanding uncontrolled hypertension.   DVT prophylaxis: lovenox Family Communication:none Disposition Plan/Barrier to D/C: Physical therapy evaluated the patient recommended inpatient rehab.  Code Status:     Code Status Orders  (From admission, onward)         Start     Ordered   10/01/19 0011  Full code  Continuous     10/01/19 0012        Code Status History    This patient has a current code status but no historical code status.   Advance Care Planning Activity        IV Access:    Peripheral IV   Procedures and diagnostic studies:   ECHOCARDIOGRAM COMPLETE  Result Date: 10/02/2019    ECHOCARDIOGRAM REPORT   Patient Name:   Alison Whitehead Date of Exam: 10/02/2019 Medical Rec #:  [id number]            Height:       67.0 in Accession #:    [id number]           Weight:       184.1 lb Date of Birth:  Dec 30, 1950           BSA:          1.952 m Patient Age:    3 years             BP:           139/86 mmHg Patient Gender: F                    HR:           70 bpm. Exam Location:  Inpatient Procedure: 2D Echo Indications:    stroke 434.91  History:        Patient has no prior history of Echocardiogram examinations.                 Risk Factors:Hypertension.  Sonographer:    Johny Chess Referring Phys: [place]  1. Left ventricular ejection  fraction, by estimation, is 55 to 60%. The left ventricle has normal function. The left ventricle has no regional wall motion abnormalities. The left ventricular internal cavity size was severely dilated. Left ventricular diastolic parameters are consistent with Grade I diastolic dysfunction (impaired relaxation). Elevated left ventricular end-diastolic pressure.  2. Right ventricular systolic function is normal. The right ventricular size is normal. There is normal pulmonary artery systolic pressure.  3. The mitral valve is normal in structure and function. Trivial mitral valve regurgitation. No evidence of mitral stenosis.  4. The aortic valve is tricuspid. Aortic valve regurgitation is trivial. Mild to moderate aortic valve sclerosis/calcification is present, without any evidence of aortic stenosis.  5. Aortic dilatation noted. There is mild dilatation of the ascending aorta  measuring 43 mm.  6. The inferior vena cava is normal in size with greater than 50% respiratory variability, suggesting right atrial pressure of 3 mmHg. FINDINGS  Left Ventricle: Left ventricular ejection fraction, by estimation, is 55 to 60%. The left ventricle has normal function. The left ventricle has no regional wall motion abnormalities. The left ventricular internal cavity size was severely dilated. There is no left ventricular hypertrophy. Left ventricular diastolic parameters are consistent with Grade I diastolic dysfunction (impaired relaxation). Elevated left ventricular end-diastolic pressure. Right Ventricle: The right ventricular size is normal. No increase in right ventricular wall thickness. Right ventricular systolic function is normal. There is normal pulmonary artery systolic pressure. The tricuspid regurgitant velocity is 2.47 m/s, and  with an assumed right atrial pressure of 3 mmHg, the estimated right ventricular systolic pressure is [id number]. Left Atrium: Left atrial size was normal in size. Right Atrium: Right atrial size was normal in size. Pericardium: There is no evidence of pericardial effusion. Mitral Valve: The mitral valve is normal in structure and function. Normal mobility of the mitral valve leaflets. Trivial mitral valve regurgitation. No evidence of mitral valve stenosis. Tricuspid Valve: The tricuspid valve is normal in structure. Tricuspid valve regurgitation is trivial. No evidence of tricuspid stenosis. Aortic Valve: The aortic valve is tricuspid. . There is moderate thickening and moderate calcification of the aortic valve. Aortic valve regurgitation is trivial. Mild to moderate aortic valve sclerosis/calcification is present, without any evidence of aortic stenosis. There is moderate thickening of the aortic valve. There is moderate calcification of the aortic valve. Pulmonic Valve: The pulmonic valve was normal in structure. Pulmonic valve regurgitation is not visualized.  No evidence of pulmonic stenosis. Aorta: The aortic root is normal in size and structure and aortic dilatation noted. 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 Venous: The inferior vena cava is normal in size with greater than 50% respiratory variability, suggesting right atrial pressure of 3 mmHg. IAS/Shunts: The interatrial septum appears to be lipomatous. No atrial level shunt detected by color flow Doppler.  LEFT VENTRICLE PLAX 2D LVIDd:         5.70 cm  Diastology LVIDs:         4.20 cm  LV e' lateral:   7.83 cm/s LV PW:         1.30 cm  LV E/e' lateral: 11.6 LV IVS:        1.10 cm  LV e' medial:    5.11 cm/s LVOT diam:     2.10 cm  LV E/e' medial:  17.8 LV SV:         65 LV SV Index:   33 LVOT Area:     3.46 cm  RIGHT  VENTRICLE RV S prime:     11.60 cm/s TAPSE (M-mode): 1.7 cm LEFT ATRIUM             Index       RIGHT ATRIUM           Index LA diam:        4.60 cm 2.36 cm/m  RA Area:     15.50 cm LA Vol (A2C):   49.5 ml 25.36 ml/m RA Volume:   36.90 ml  18.90 ml/m LA Vol (A4C):   44.1 ml 22.59 ml/m LA Biplane Vol: 49.4 ml 25.30 ml/m  AORTIC VALVE LVOT Vmax:   86.80 cm/s LVOT Vmean:  54.700 cm/s LVOT VTI:    0.187 m  AORTA Ao Root diam: 3.00 cm Ao Asc diam:  4.30 cm MITRAL VALVE               TRICUSPID VALVE MV Area (PHT): 4.39 cm    TR Peak grad:   24.4 mmHg MV Decel Time: 173 msec    TR Vmax:        247.00 cm/s MV E velocity: 90.80 cm/s MV A velocity: 99.40 cm/s  SHUNTS MV E/A ratio:  0.91        Systemic VTI:  0.19 m                            Systemic Diam: 2.10 cm Fransico Him MD Electronically signed by Fransico Him MD Signature Date/Time: 10/02/2019/11:16:07 AM    Final    VAS US CAROTID  Result Date: 10/02/2019 Carotid Arterial Duplex Study Indications:       CVA. Risk Factors:      Hypertension. Comparison Study:  no prior Performing Technologist: Abram Sander RVS  Examination Guidelines: A complete evaluation includes B-mode imaging, spectral Doppler, color Doppler,  and power Doppler as needed of all accessible portions of each vessel. Bilateral testing is considered an integral part of a complete examination. Limited examinations for reoccurring indications may be performed as noted.  Right Carotid Findings: +----------+--------+--------+--------+------------------+--------+           PSV cm/sEDV cm/sStenosisPlaque DescriptionComments +----------+--------+--------+--------+------------------+--------+ CCA Prox  68      16              heterogenous               +----------+--------+--------+--------+------------------+--------+ CCA Distal50      17              heterogenous               +----------+--------+--------+--------+------------------+--------+ ICA Prox  40      20      1-39%   heterogenous               +----------+--------+--------+--------+------------------+--------+ ICA Distal26      10                                         +----------+--------+--------+--------+------------------+--------+ ECA       73      11                                         +----------+--------+--------+--------+------------------+--------+ +----------+--------+-------+--------+-------------------+           PSV  cm/sEDV cmsDescribeArm Pressure (mmHG) +----------+--------+-------+--------+-------------------+ [id number]                                         +----------+--------+-------+--------+-------------------+ +---------+--------+--+--------+--+---------+ VertebralPSV cm/s39EDV cm/s12Antegrade +---------+--------+--+--------+--+---------+  Left Carotid Findings: +----------+--------+--------+--------+------------------+--------+           PSV cm/sEDV cm/sStenosisPlaque DescriptionComments +----------+--------+--------+--------+------------------+--------+ CCA Prox  60      19              heterogenous               +----------+--------+--------+--------+------------------+--------+ CCA Distal45       16              heterogenous               +----------+--------+--------+--------+------------------+--------+ ICA Prox  43      20      1-39%   heterogenous               +----------+--------+--------+--------+------------------+--------+ ICA Distal38      17                                         +----------+--------+--------+--------+------------------+--------+ ECA       48      10                                         +----------+--------+--------+--------+------------------+--------+ +----------+--------+--------+--------+-------------------+           PSV cm/sEDV cm/sDescribeArm Pressure (mmHG) +----------+--------+--------+--------+-------------------+ [id number]                                          +----------+--------+--------+--------+-------------------+ +---------+--------+--+--------+-+---------+ VertebralPSV cm/s29EDV cm/s8Antegrade +---------+--------+--+--------+-+---------+   Summary: Right Carotid: Velocities in the right ICA are consistent with a 1-39% stenosis. Left Carotid: Velocities in the left ICA are consistent with a 1-39% stenosis. Vertebrals: Bilateral vertebral arteries demonstrate antegrade flow. *See table(s) above for measurements and observations.     Preliminary      Medical Consultants:    None.  Anti-Infectives:   None  Subjective:    Alison Whitehead continues to be dizzy.  Objective:    Vitals:   10/02/19 1931 10/02/19 2323 10/03/19 0344 10/03/19 0800  BP: (!) 158/96 (!) 147/74 (!) 138/110 (!) 177/114  Pulse: 64 89 61 69  Resp: 19 19 19 16   Temp: 97.8 F (36.6 C) 98 F (36.7 C) 97.9 F (36.6 C) 98 F (36.7 C)  TempSrc: Oral Oral Oral Oral  SpO2: 100% 100% 100% 98%  Weight:      Height:       SpO2: 98 %   Intake/Output Summary (Last 24 hours) at 10/03/2019 1037 Last data filed at 10/03/2019 0630 Gross per 24 hour  Intake 480 ml  Output 150 ml  Net 330 ml   Filed Weights    09/30/19 2345  Weight: 83.5 kg    Exam: General exam: In no acute distress. Respiratory system: Good air movement and clear to auscultation. Cardiovascular system: S1 & S2 heard, RRR. No JVD. Gastrointestinal system: Abdomen is nondistended, soft and nontender.  Central nervous system: Alert and oriented.  No focal neurological deficits. Extremities: No pedal edema.  Data Reviewed:    Labs: Basic Metabolic Panel: Recent Labs  Lab 09/30/19 1407 10/01/19 0039  NA 143 142  K 4.2 4.3  CL 105 106  CO2 27 27  GLUCOSE 163* 130*  BUN 23 16  CREATININE 1.00 1.01*  CALCIUM 8.9 8.8*   GFR Estimated Creatinine Clearance: 60.1 mL/min (A) (by C-G formula based on SCr of 1.01 mg/dL (H)). Liver Function Tests: Recent Labs  Lab 09/30/19 1407 10/01/19 0039  AST 16 16  ALT 16 14  ALKPHOS 65 61  BILITOT 0.6 0.8  PROT 7.2 7.1  ALBUMIN 3.7 3.5   Recent Labs  Lab 09/30/19 1407  LIPASE 24   No results for input(s): AMMONIA in the last 168 hours. Coagulation profile No results for input(s): INR, PROTIME in the last 168 hours. COVID-19 Labs  No results for input(s): DDIMER, FERRITIN, LDH, CRP in the last 72 hours.  No results found for: SARSCOV2NAA  CBC: Recent Labs  Lab 09/30/19 1407 10/01/19 0039  WBC 4.9 5.3  HGB 14.2 14.1  HCT 46.5* 45.9  MCV 90.3 89.3  PLT 259 305   Cardiac Enzymes: No results for input(s): CKTOTAL, CKMB, CKMBINDEX, TROPONINI in the last 168 hours. BNP (last 3 results) No results for input(s): PROBNP in the last 8760 hours. CBG: No results for input(s): GLUCAP in the last 168 hours. D-Dimer: No results for input(s): DDIMER in the last 72 hours. Hgb A1c: Recent Labs    10/02/19 1458  HGBA1C 6.2*   Lipid Profile: No results for input(s): CHOL, HDL, LDLCALC, TRIG, CHOLHDL, LDLDIRECT in the last 72 hours. Thyroid function studies: Recent Labs    10/01/19 0039  TSH 1.431   Anemia work up: No results for input(s): VITAMINB12, FOLATE,  FERRITIN, TIBC, IRON, RETICCTPCT in the last 72 hours. Sepsis Labs: Recent Labs  Lab 09/30/19 1407 10/01/19 0039  WBC 4.9 5.3   Microbiology No results found for this or any previous visit (from the past 240 hour(s)).   Medications:   . amLODipine  10 mg Oral Daily  . aspirin  325 mg Oral Daily  . atorvastatin  40 mg Oral q1800  . carvedilol  3.125 mg Oral BID WC  . feeding supplement (ENSURE ENLIVE)  237 mL Oral BID BM  . hydrochlorothiazide  12.5 mg Oral Daily  . lisinopril  20 mg Oral Daily  . loratadine  10 mg Oral Daily  . multivitamin with minerals  1 tablet Oral Daily  . polyethylene glycol  17 g Oral Daily   Continuous Infusions: . sodium chloride 10 mL/hr at 10/01/19 1041      LOS: 3 days   Charlynne Cousins  [facility]  10/03/2019, 10:37 AM

## 2019-10-03 NOTE — NC FL2 (Signed)
[place] LEVEL OF CARE SCREENING TOOL     IDENTIFICATION  Patient Name: Alison Whitehead Birthdate: 10/17/1950 Sex: female Admission Date (Current Location): 09/30/2019  [facility] and [place] Number:  Herbalist and Address:  [facility], [place] [address], [place], [place] [address]      Provider Number: [id number]  Attending Physician Name and Address:  Charlynne Cousins, MD  Relative Name and Phone Number:       Current Level of Care: Hospital Recommended Level of Care: [place] Prior Approval Number:    Date Approved/Denied:   PASRR Number: [id number]  Discharge Plan: SNF    Current Diagnoses: Patient Active Problem List   Diagnosis Date Noted  . Acute cerebrovascular accident (CVA) ([place]) 09/30/2019  . Unilateral primary osteoarthritis, left knee 03/17/2019  . Unilateral primary osteoarthritis, right knee 03/17/2019  . Bilateral primary osteoarthritis of knee 03/10/2019  . INSOMNIA, CHRONIC 05/19/2007  . SPONDYLOSIS, LUMBAR 05/19/2007  . Essential hypertension 05/03/2007  . ARTHRITIS, KNEE 05/03/2007    Orientation RESPIRATION BLADDER Height & Weight     Self, Time, Situation, Place  Normal Continent Weight: 184 lb 1.4 oz (83.5 kg) Height:  5\' 7"  (170.2 cm)  BEHAVIORAL SYMPTOMS/MOOD NEUROLOGICAL BOWEL NUTRITION STATUS      Continent Diet(see discharge summary)  AMBULATORY STATUS COMMUNICATION OF NEEDS Skin   Extensive Assist Verbally Normal                       Personal Care Assistance Level of Assistance  Bathing, Feeding, Dressing Bathing Assistance: Maximum assistance Feeding assistance: Independent Dressing Assistance: Maximum assistance     Functional Limitations Info  Sight, Hearing, Speech Sight Info: Adequate Hearing Info: Adequate Speech Info: Impaired(slurred)    SPECIAL CARE FACTORS FREQUENCY  PT (By licensed PT), OT (By licensed OT)     PT Frequency: 5x  week OT Frequency: 5x week            Contractures Contractures Info: Not present    Additional Factors Info  Code Status, Allergies Code Status Info: Full Code Allergies Info: Penicillins           Current Medications (10/03/2019):  This is the current hospital active medication list Current Facility-Administered Medications  Medication Dose Route Frequency Provider Last Rate Last Admin  . 0.45 % sodium chloride infusion   Intravenous Continuous Charlynne Cousins, MD 10 mL/hr at 10/01/19 1041 Rate Change at 10/01/19 1041  . acetaminophen (TYLENOL) tablet 650 mg  650 mg Oral Q6H PRN Elwyn Reach, MD   650 mg at 10/02/19 1104   Or  . acetaminophen (TYLENOL) suppository 650 mg  650 mg Rectal Q6H PRN Gala Romney L, MD      . amLODipine (NORVASC) tablet 10 mg  10 mg Oral Daily Elwyn Reach, MD   10 mg at 10/03/19 0917  . aspirin tablet 325 mg  325 mg Oral Daily Melvenia Beam, MD   325 mg at 10/03/19 0916  . atorvastatin (LIPITOR) tablet 40 mg  40 mg Oral q1800 Elwyn Reach, MD   40 mg at 10/02/19 1512  . carvedilol (COREG) tablet 6.25 mg  6.25 mg Oral BID WC Charlynne Cousins, MD      . clon[facility]) tablet 1 mg  1 mg Oral TID PRN Elwyn Reach, MD      . feeding supplement (ENSURE ENLIVE) (ENSURE ENLIVE) liquid 237 mL  237 mL  Oral BID BM Charlynne Cousins, MD   237 mL at 10/01/19 1404  . hydrALAZINE (APRESOLINE) tablet 10 mg  10 mg Oral Q6H PRN Elwyn Reach, MD   10 mg at 10/02/19 [id number]  . hydrochlorothiazide (MICROZIDE) capsule 12.5 mg  12.5 mg Oral Daily Charlynne Cousins, MD   12.5 mg at 10/03/19 0917  . HYDROcodone-acetaminophen (NORCO) 10-325 MG per tablet 1 tablet  1 tablet Oral Q4H PRN Charlynne Cousins, MD   1 tablet at 10/03/19 [phone]  . isosorbide-hydrALAZINE (BIDIL) 20-37.5 MG per tablet 1 tablet  1 tablet Oral TID Charlynne Cousins, MD   1 tablet at 10/03/19 1203  . lisinopril (ZESTRIL) tablet 20 mg  20 mg Oral Daily  Elwyn Reach, MD   20 mg at 10/03/19 0917  . loratadine (CLARITIN) tablet 10 mg  10 mg Oral Daily Charlynne Cousins, MD   10 mg at 10/03/19 0917  . meclizine (ANTIVERT) tablet 12.5 mg  12.5 mg Oral BID PRN Charlynne Cousins, MD      . multivitamin with minerals tablet 1 tablet  1 tablet Oral Daily Charlynne Cousins, MD   1 tablet at 10/03/19 [id number]  . ondansetron (ZOFRAN) tablet 4 mg  4 mg Oral Q6H PRN Elwyn Reach, MD   4 mg at 10/03/19 0616   Or  . ondansetron (ZOFRAN) injection 4 mg  4 mg Intravenous Q6H PRN Elwyn Reach, MD   4 mg at 10/03/19 0916  . polyethylene glycol (MIRALAX / GLYCOLAX) packet 17 g  17 g Oral Daily Charlynne Cousins, MD   17 g at 10/03/19 [id number]     Discharge Medications: Please see discharge summary for a list of discharge medications.  Relevant Imaging Results:  Relevant Lab Results:   Additional Information SS[address][id number] [place], [place]

## 2019-10-03 NOTE — Evaluation (Signed)
Speech Language Pathology Evaluation Patient Details Name: Alison Whitehead MRN: [id number] DOB: Dec 05, 1950 Today's Date: 10/03/2019 Time: [id number] SLP Time Calculation (min) (ACUTE ONLY): 21 min  Problem List:  Patient Active Problem List   Diagnosis Date Noted  . Acute cerebrovascular accident (CVA) ([place]) 09/30/2019  . Unilateral primary osteoarthritis, left knee 03/17/2019  . Unilateral primary osteoarthritis, right knee 03/17/2019  . Bilateral primary osteoarthritis of knee 03/10/2019  . INSOMNIA, CHRONIC 05/19/2007  . SPONDYLOSIS, LUMBAR 05/19/2007  . Essential hypertension 05/03/2007  . ARTHRITIS, KNEE 05/03/2007   Past Medical History:  Past Medical History:  Diagnosis Date  . Arthritis   . DDD (degenerative disc disease)   . Degenerative disc disease, lumbar   . Hypertension   . IBS (irritable bowel syndrome)   . Osteopenia    Past Surgical History:  Past Surgical History:  Procedure Laterality Date  . SHOULDER SURGERY     HPI:  Alison Whitehead is an 69 y.o. female past medical history significant for hypertension, DJD IBS who presents with dizziness and nausea. MRI of the brain showed acute/subacute early microhemorrhage or hemorrhagic lacunar infarct in the posterior pons, chronic small vessel ischemia with multiple lacunar infarcts in the basal ganglia bilaterally and thalamus.   Assessment / Plan / Recommendation Clinical Impression    Pt presents with cognitive linguistic impairments in the areas of recall and executive function. Pt with expressive and receptive language skills that are grossly [facility].   Pt was assessed using the COGNISTAT (see below for additional information), all scores fell within the [facility] range except for the memory portion. However, informally, patient with impulsivity and concerns for decreased safety awareness.    Pt exhibited some difficulty with following complex sequential commands as well, but this does not appear due to a true  comprehension deficit and is more likely the result of decreased attention.  Patient appears to have some decreased insight into own limitations.  Pt will benefit from skilled ST to target recall, executive function and higher level cognitive tasks. She will benefit from ST at the next venue of care as well.  COGNISTAT - all subtests are within the average range, except where otherwise specified Orientation: not given Attention: 12/12 Comprehension: not given Repetition: 12/12 Naming: 8/8 Construction: not given Memory: 6/12  moderate impairment Calculation: 4/4 Similarities: 6/8 Judgment: 6/6     SLP Assessment  SLP Recommendation/Assessment: Patient needs continued Speech Lanaguage Pathology Services SLP Visit Diagnosis: Cognitive communication deficit (R41.841)    Follow Up Recommendations  Inpatient Rehab    Frequency and Duration min 2x/week  2 weeks      SLP Evaluation Cognition  Overall Cognitive Status: Impaired/Different from baseline Arousal/Alertness: Awake/alert Orientation Level: Oriented X4 Attention: Sustained Sustained Attention: Impaired Sustained Attention Impairment: Verbal complex;Functional basic Memory: Impaired Memory Impairment: Decreased short term memory Decreased Short Term Memory: Verbal basic Awareness: Impaired Executive Function: Self Monitoring Self Monitoring: Impaired Behaviors: Impulsive       Comprehension  Auditory Comprehension Overall Auditory Comprehension: Appears within functional limits for tasks assessed Reading Comprehension Reading Status: Not tested    Expression Expression Primary Mode of Expression: Verbal Verbal Expression Overall Verbal Expression: Appears within functional limits for tasks assessed Written Expression Dominant Hand: Right Written Expression: Not tested   Oral / Motor  Oral Motor/Sensory Function Overall Oral Motor/Sensory Function: Within functional limits Motor Speech Overall Motor  Speech: Appears within functional limits for tasks assessed   GO  Alison Whitehead 10/03/2019, 10:49 AM  Marina Goodell, M.Ed., [facility] Acute Rehabilitation [phone]: Acute Rehab office [phone] - pager

## 2019-10-04 LAB — BASIC METABOLIC PANEL
Anion gap: 7 (ref 5–15)
BUN: 19 mg/dL (ref 8–23)
CO2: 30 mmol/L (ref 22–32)
Calcium: 9.1 mg/dL (ref 8.9–10.3)
Chloride: 101 mmol/L (ref 98–111)
Creatinine, Ser: 1.01 mg/dL — ABNORMAL HIGH (ref 0.44–1.00)
GFR calc Af Amer: >60 mL/min (ref >60)
GFR calc non Af Amer: 58 mL/min — ABNORMAL LOW (ref >60)
Glucose, Bld: 109 mg/dL — ABNORMAL HIGH (ref 70–99)
Potassium: 4.2 mmol/L (ref 3.5–5.1)
Sodium: 138 mmol/L (ref 135–145)

## 2019-10-04 NOTE — Progress Notes (Signed)
Physical Therapy Treatment Patient Details Name: Alison Whitehead MRN: [id number] DOB: January 29, 1951 Today's Date: 10/04/2019    History of Present Illness Alison Whitehead is an 69 y.o. female past medical history significant for hypertension, DJD IBS who presents with dizziness and nausea. MRI of the brain showed acute/subacute early microhemorrhage or hemorrhagic lacunar infarct in the posterior pons, chronic small vessel ischemia with multiple lacunar infarcts in the basal ganglia bilaterally and thalamus.    PT Comments    Pt continues to improve this session. She required min assistance overall for mobility.  She continues to present with safety concerns.  Focused on LE strengthening and transfer training.  Gt limited as she remains to report dizziness. Continue to recommend aggressive rehab in a post acute setting.     Follow Up Recommendations  CIR     Equipment Recommendations  Other (comment)(TBA)    Recommendations for Other Services       Precautions / Restrictions Precautions Precautions: Fall Restrictions Weight Bearing Restrictions: No    Mobility  Bed Mobility Overal bed mobility: Needs Assistance Bed Mobility: Supine to Sit;Sit to Supine     Supine to sit: Min guard Sit to supine: Min guard   General bed mobility comments: Min Guard A for safety  Transfers Overall transfer level: Needs assistance Equipment used: Rolling walker (2 wheeled) Transfers: Sit to/from Stand Sit to Stand: Min assist         General transfer comment: Cues for hand placement to push from seated surface.  Ambulation/Gait Ambulation/Gait assistance: Min assist Gait Distance (Feet): 10 Feet(x2) Assistive device: Rolling walker (2 wheeled) Gait Pattern/deviations: Step-through pattern;Staggering right;Ataxic;Trunk flexed;Scissoring;Narrow base of support     General Gait Details: Pt continues to push device too far forward and remains with difficulty following  commands to stay safe in device.   Stairs             Wheelchair Mobility    Modified Rankin (Stroke Patients Only) Modified Rankin (Stroke Patients Only) Pre-Morbid Rankin Score: No symptoms Modified Rankin: Moderately severe disability     Balance Overall balance assessment: Needs assistance   Sitting balance-Leahy Scale: Fair                                      Cognition Arousal/Alertness: Awake/alert Behavior During Therapy: Impulsive Overall Cognitive Status: Impaired/Different from baseline Area of Impairment: Attention;Safety/judgement;Problem solving;Awareness;Following commands                   Current Attention Level: Sustained Memory: Decreased short-term memory Following Commands: Follows one step commands with increased time;Follows multi-step commands inconsistently Safety/Judgement: Decreased awareness of safety;Decreased awareness of deficits Awareness: Intellectual Problem Solving: Slow processing;Requires verbal cues;Requires tactile cues General Comments: Pt impulsive, extremely distractable, demonstrating almost manic behaviors. Needs frequent cues for attending to task and for safety.      Exercises General Exercises - Lower Extremity Ankle Circles/Pumps: AROM;Both;Supine;20 reps Quad Sets: AROM;Both;10 reps;Supine Heel Slides: AROM;Both;10 reps;Supine Hip ABduction/ADduction: AROM;Both;10 reps;Supine Straight Leg Raises: AROM;Both;10 reps;Supine    General Comments        Pertinent Vitals/Pain Pain Assessment: No/denies pain    Home Living                      Prior Function            PT Goals (current goals can now be found in the care plan  section) Acute Rehab PT Goals Patient Stated Goal: Go home Potential to Achieve Goals: Good Progress towards PT goals: Progressing toward goals    Frequency    Min 4X/week      PT Plan Current plan remains appropriate    Co-evaluation               AM-PAC PT "6 Clicks" Mobility   Outcome Measure  Help needed turning from your back to your side while in a flat bed without using bedrails?: None Help needed moving from lying on your back to sitting on the side of a flat bed without using bedrails?: A Little Help needed moving to and from a bed to a chair (including a wheelchair)?: A Little Help needed standing up from a chair using your arms (e.g., wheelchair or bedside chair)?: A Little Help needed to walk in hospital room?: A Little Help needed climbing 3-5 steps with a railing? : A Little 6 Click Score: 19    End of Session Equipment Utilized During Treatment: Gait belt Activity Tolerance: Patient tolerated treatment well Patient left: in bed;with call bell/phone within reach;with bed alarm set Nurse Communication: Mobility status PT Visit Diagnosis: Unsteadiness on feet (R26.81);Difficulty in walking, not elsewhere classified (R26.2);Other symptoms and signs involving the nervous system (R29.898)     Time: [id number] PT Time Calculation (min) (ACUTE ONLY): 14 min  Charges:  $Therapeutic Activity: 8-22 mins                     Erasmo Leventhal , PTA Acute Rehabilitation Services Pager [phone] Office [phone]     Janiece Scovill Eli Hose 10/04/2019, 5:53 PM

## 2019-10-04 NOTE — Plan of Care (Signed)
  Problem: Education: Goal: Knowledge of General Education information will improve Description: Including pain rating scale, medication(s)/side effects and non-pharmacologic comfort measures Outcome: Progressing   Problem: Health Behavior/Discharge Planning: Goal: Ability to manage health-related needs will improve Outcome: Progressing   Problem: Clinical Measurements: Goal: Ability to maintain clinical measurements within normal limits will improve Outcome: Progressing Goal: Will remain free from infection Outcome: Progressing Goal: Diagnostic test results will improve Outcome: Progressing Goal: Respiratory complications will improve Outcome: Progressing Goal: Cardiovascular complication will be avoided Outcome: Progressing   Problem: Activity: Goal: Risk for activity intolerance will decrease Outcome: Progressing   Problem: Nutrition: Goal: Adequate nutrition will be maintained Outcome: Progressing   Problem: Coping: Goal: Level of anxiety will decrease Outcome: Progressing   Problem: Elimination: Goal: Will not experience complications related to bowel motility Outcome: Progressing Goal: Will not experience complications related to urinary retention Outcome: Progressing   Problem: Pain Managment: Goal: General experience of comfort will improve Outcome: Progressing   Problem: Safety: Goal: Ability to remain free from injury will improve Outcome: Progressing   Problem: Skin Integrity: Goal: Risk for impaired skin integrity will decrease Outcome: Progressing   Problem: Education: Goal: Knowledge of disease or condition will improve Outcome: Progressing Goal: Knowledge of secondary prevention will improve Outcome: Progressing Goal: Knowledge of patient specific risk factors addressed and post discharge goals established will improve Outcome: Progressing Goal: Individualized Educational Video(s) Outcome: Progressing   Problem: Coping: Goal: Will verbalize  positive feelings about self Outcome: Progressing   Problem: Health Behavior/Discharge Planning: Goal: Ability to manage health-related needs will improve Outcome: Progressing   Problem: Self-Care: Goal: Ability to participate in self-care as condition permits will improve Outcome: Progressing   Problem: Ischemic Stroke/TIA Tissue Perfusion: Goal: Complications of ischemic stroke/TIA will be minimized Outcome: Progressing   

## 2019-10-04 NOTE — Plan of Care (Signed)
OOB TO BR WITH MINI ASSIST/WALKER. NO IMBALANCES NOTED.

## 2019-10-04 NOTE — Progress Notes (Signed)
[facility] PROGRESS NOTE    Progress Note  Alison Whitehead  [id number] DOB: [birth year] DOA: 09/30/2019 PCP: Patient, No Pcp Per     Brief Narrative:   Alison Whitehead is an 69 y.o. female past medical history significant for hypertension, DJD IBS who presents with dizziness and nausea started on the day of admission she relates she woke up in the room was spinning around her and almost fell to the ground became nauseated and vomiting.  MRI of the brain showed acute/subacute early microhemorrhage or hemorrhagic lacunar infarct in the posterior pons, chronic small vessel ischemia with multiple lacunar infarcts in the basal ganglia bilaterally and thalamus.  She also had multiple supratentorial chronic microhemorrhagic changes question hypertensive microangiopathy.  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  They recommended to keep her goal blood pressure less than 140/80. Blood pressure is improved today on hydrochlorothiazide, Norvasc, lisinopril, Coreg and BiDil. Carotid Doppler showed bilateral ICA stenosis less than 39%, 2D echo showed an ejection fraction of 55% with grade 1 diastolic heart failure systolic function was normal. We will continue statin. Physical therapy evaluated the patient the recommended CIR.  Continue statins. Awaiting CIR placement. Continue Antivert.  Essential hypertension: Blood pressure is improved, will continue current regimen.  Bilateral primary osteoarthritis of knee: Continue tramadol for pain avoid ibuprofen.  Abnormal EKG: Likely due to longstanding uncontrolled hypertension.   DVT prophylaxis: lovenox Family Communication:none Disposition Plan/Barrier to D/C: Physical therapy evaluated the patient recommended inpatient rehab.  Code Status:       Code Status Orders  (From admission, onward)         Start     Ordered   10/01/19 0011  Full code  Continuous     10/01/19 0012        Code Status History    This patient has a current code status but no historical code status.   Advance Care Planning Activity        IV Access:    Peripheral IV   Procedures and diagnostic studies:   ECHOCARDIOGRAM COMPLETE  Result Date: 10/02/2019    ECHOCARDIOGRAM REPORT   Patient Name:   [facility] Date of Exam: 10/02/2019 Medical Rec #:  [id number]            Height:       67.0 in Accession #:    [id number]           Weight:       184.1 lb Date of Birth:  Sep 28, 1950           BSA:          1.952 m Patient Age:    38 years             BP:           139/86 mmHg Patient Gender: F                    HR:           70 bpm. Exam Location:  Inpatient Procedure: 2D Echo Indications:    stroke 434.91  History:        Patient has no prior history of Echocardiogram examinations.                 Risk Factors:Hypertension.  Sonographer:    Johny Chess Referring Phys: [place]  1. Left ventricular ejection fraction, by estimation, is 55 to 60%. The left ventricle has normal function. The left ventricle has no regional wall motion abnormalities. The left ventricular internal cavity size was severely dilated. Left ventricular diastolic parameters are consistent with Grade I diastolic dysfunction (impaired relaxation). Elevated left ventricular end-diastolic pressure.  2. Right ventricular systolic function is normal. The right ventricular size is normal. There is normal pulmonary artery systolic pressure.  3. The mitral valve is normal in structure and function. Trivial mitral valve regurgitation. No evidence of mitral stenosis.  4. The aortic valve is tricuspid. Aortic valve regurgitation is trivial. Mild to moderate aortic valve sclerosis/calcification is present, without any evidence of aortic stenosis.  5. Aortic dilatation  noted. 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  6. The inferior vena cava is normal in size with greater than 50% respiratory variability, suggesting right atrial pressure of 3 mmHg. FINDINGS  Left Ventricle: Left ventricular ejection fraction, by estimation, is 55 to 60%. The left ventricle has normal function. The left ventricle has no regional wall motion abnormalities. The left ventricular internal cavity size was severely dilated. There is no left ventricular hypertrophy. Left ventricular diastolic parameters are consistent with Grade I diastolic dysfunction (impaired relaxation). Elevated left ventricular end-diastolic pressure. Right Ventricle: The right ventricular size is normal. No increase in right ventricular wall thickness. Right ventricular systolic function is normal. There is normal pulmonary artery systolic pressure. The tricuspid regurgitant velocity is 2.47 m/s, and  with an assumed right atrial pressure of 3 mmHg, the estimated right ventricular systolic pressure is [id number]. Left Atrium: Left atrial size was normal in size. Right Atrium: Right atrial size was normal in size. Pericardium: There is no evidence of pericardial effusion. Mitral Valve: The mitral valve is normal in structure and function. Normal mobility of the mitral valve leaflets. Trivial mitral valve regurgitation. No evidence of mitral valve stenosis. Tricuspid Valve: The tricuspid valve is normal in structure. Tricuspid valve regurgitation is trivial. No evidence of tricuspid stenosis. Aortic Valve: The aortic valve is tricuspid. . There is moderate thickening and moderate calcification of the aortic valve. Aortic valve regurgitation is trivial. Mild to moderate aortic valve sclerosis/calcification is present, without any evidence of aortic stenosis. There is moderate thickening of the aortic valve. There is moderate calcification of the aortic valve. Pulmonic Valve: The pulmonic valve was normal in  structure. Pulmonic valve regurgitation is not visualized. No evidence of pulmonic stenosis. Aorta: The aortic root is normal in size and structure and aortic dilatation noted. 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 Venous: The inferior vena cava is normal in size with greater than 50% respiratory variability, suggesting right atrial pressure of 3 mmHg. IAS/Shunts: The interatrial septum appears to be lipomatous. No atrial level shunt detected by color flow Doppler.  LEFT VENTRICLE PLAX 2D LVIDd:         5.70 cm  Diastology LVIDs:         4.20 cm  LV e' lateral:   7.83 cm/s LV PW:         1.30 cm  LV E/e' lateral: 11.6 LV IVS:        1.10 cm  LV e' medial:    5.11 cm/s LVOT diam:     2.10 cm  LV E/e' medial:  17.8 LV SV:         65 LV SV Index:   33 LVOT Area:  3.46 cm  RIGHT VENTRICLE RV S prime:     11.60 cm/s TAPSE (M-mode): 1.7 cm LEFT ATRIUM             Index       RIGHT ATRIUM           Index LA diam:        4.60 cm 2.36 cm/m  RA Area:     15.50 cm LA Vol (A2C):   49.5 ml 25.36 ml/m RA Volume:   36.90 ml  18.90 ml/m LA Vol (A4C):   44.1 ml 22.59 ml/m LA Biplane Vol: 49.4 ml 25.30 ml/m  AORTIC VALVE LVOT Vmax:   86.80 cm/s LVOT Vmean:  54.700 cm/s LVOT VTI:    0.187 m  AORTA Ao Root diam: 3.00 cm Ao Asc diam:  4.30 cm MITRAL VALVE               TRICUSPID VALVE MV Area (PHT): 4.39 cm    TR Peak grad:   24.4 mmHg MV Decel Time: 173 msec    TR Vmax:        247.00 cm/s MV E velocity: 90.80 cm/s MV A velocity: 99.40 cm/s  SHUNTS MV E/A ratio:  0.91        Systemic VTI:  0.19 m                            Systemic Diam: 2.10 cm Fransico Him MD Electronically signed by Fransico Him MD Signature Date/Time: 10/02/2019/11:16:07 AM    Final    VAS US CAROTID  Result Date: 10/03/2019 Carotid Arterial Duplex Study Indications:       CVA. Risk Factors:      Hypertension. Comparison Study:  no prior Performing Technologist: Abram Sander RVS  Examination Guidelines: A complete evaluation  includes B-mode imaging, spectral Doppler, color Doppler, and power Doppler as needed of all accessible portions of each vessel. Bilateral testing is considered an integral part of a complete examination. Limited examinations for reoccurring indications may be performed as noted.  Right Carotid Findings: +----------+--------+--------+--------+------------------+--------+           PSV cm/sEDV cm/sStenosisPlaque DescriptionComments +----------+--------+--------+--------+------------------+--------+ CCA Prox  68      16              heterogenous               +----------+--------+--------+--------+------------------+--------+ CCA Distal50      17              heterogenous               +----------+--------+--------+--------+------------------+--------+ ICA Prox  40      20      1-39%   heterogenous               +----------+--------+--------+--------+------------------+--------+ ICA Distal26      10                                         +----------+--------+--------+--------+------------------+--------+ ECA       73      11                                         +----------+--------+--------+--------+------------------+--------+ +----------+--------+-------+--------+-------------------+  PSV cm/sEDV cmsDescribeArm Pressure (mmHG) +----------+--------+-------+--------+-------------------+ [id number]                                         +----------+--------+-------+--------+-------------------+ +---------+--------+--+--------+--+---------+ VertebralPSV cm/s39EDV cm/s12Antegrade +---------+--------+--+--------+--+---------+  Left Carotid Findings: +----------+--------+--------+--------+------------------+--------+           PSV cm/sEDV cm/sStenosisPlaque DescriptionComments +----------+--------+--------+--------+------------------+--------+ CCA Prox  60      19              heterogenous                +----------+--------+--------+--------+------------------+--------+ CCA Distal45      16              heterogenous               +----------+--------+--------+--------+------------------+--------+ ICA Prox  43      20      1-39%   heterogenous               +----------+--------+--------+--------+------------------+--------+ ICA Distal38      17                                         +----------+--------+--------+--------+------------------+--------+ ECA       48      10                                         +----------+--------+--------+--------+------------------+--------+ +----------+--------+--------+--------+-------------------+           PSV cm/sEDV cm/sDescribeArm Pressure (mmHG) +----------+--------+--------+--------+-------------------+ [id number]                                          +----------+--------+--------+--------+-------------------+ +---------+--------+--+--------+-+---------+ VertebralPSV cm/s29EDV cm/s8Antegrade +---------+--------+--+--------+-+---------+   Summary: Right Carotid: Velocities in the right ICA are consistent with a 1-39% stenosis. Left Carotid: Velocities in the left ICA are consistent with a 1-39% stenosis. Vertebrals: Bilateral vertebral arteries demonstrate antegrade flow. *See table(s) above for measurements and observations.  Electronically signed by Antony Contras MD on 10/03/2019 at [id number] AM.    Final      Medical Consultants:    None.  Anti-Infectives:   None  Subjective:    Alison Whitehead she relates that she continues to be dizzy but is somewhat improved.  Yesterday.  Objective:    Vitals:   10/03/19 2148 10/03/19 2352 10/04/19 0356 10/04/19 0826  BP: 114/75 112/70 128/86 (!) 128/94  Pulse: 72 61 70 67  Resp:  18 18 20   Temp:  97.9 F (36.6 C) 97.8 F (36.6 C) (!) 97.5 F (36.4 C)  TempSrc:  Oral Oral Oral  SpO2:  99% 97% 98%  Weight:      Height:       SpO2: 98  %   Intake/Output Summary (Last 24 hours) at 10/04/2019 0845 Last data filed at 10/04/2019 0358 Gross per 24 hour  Intake 720 ml  Output 401 ml  Net 319 ml   Filed Weights   09/30/19 2345  Weight: 83.5 kg    Exam: General exam: In no acute distress. Respiratory system: Good air movement and clear to auscultation. Cardiovascular system: S1 & S2 heard, RRR.  No JVD. Central nervous system: Alert and oriented. No focal neurological deficits. Extremities: No pedal edema. Skin: No rashes, lesions or ulcers  Data Reviewed:    Labs: Basic Metabolic Panel: Recent Labs  Lab 09/30/19 1407 09/30/19 1407 10/01/19 0039 10/04/19 0328  NA 143  --  142 138  K 4.2   < > 4.3 4.2  CL 105  --  106 101  CO2 27  --  27 30  GLUCOSE 163*  --  130* 109*  BUN 23  --  16 19  CREATININE 1.00  --  1.01* 1.01*  CALCIUM 8.9  --  8.8* 9.1   < > = values in this interval not displayed.   GFR Estimated Creatinine Clearance: 60.1 mL/min (A) (by C-G formula based on SCr of 1.01 mg/dL (H)). Liver Function Tests: Recent Labs  Lab 09/30/19 1407 10/01/19 0039  AST 16 16  ALT 16 14  ALKPHOS 65 61  BILITOT 0.6 0.8  PROT 7.2 7.1  ALBUMIN 3.7 3.5   Recent Labs  Lab 09/30/19 1407  LIPASE 24   No results for input(s): AMMONIA in the last 168 hours. Coagulation profile No results for input(s): INR, PROTIME in the last 168 hours. COVID-19 Labs  No results for input(s): DDIMER, FERRITIN, LDH, CRP in the last 72 hours.  No results found for: SARSCOV2NAA  CBC: Recent Labs  Lab 09/30/19 1407 10/01/19 0039  WBC 4.9 5.3  HGB 14.2 14.1  HCT 46.5* 45.9  MCV 90.3 89.3  PLT 259 305   Cardiac Enzymes: No results for input(s): CKTOTAL, CKMB, CKMBINDEX, TROPONINI in the last 168 hours. BNP (last 3 results) No results for input(s): PROBNP in the last 8760 hours. CBG: No results for input(s): GLUCAP in the last 168 hours. D-Dimer: No results for input(s): DDIMER in the last 72 hours. Hgb  A1c: Recent Labs    10/02/19 1458  HGBA1C 6.2*   Lipid Profile: No results for input(s): CHOL, HDL, LDLCALC, TRIG, CHOLHDL, LDLDIRECT in the last 72 hours. Thyroid function studies: No results for input(s): TSH, T4TOTAL, T3FREE, THYROIDAB in the last 72 hours.  Invalid input(s): FREET3 Anemia work up: No results for input(s): VITAMINB12, FOLATE, FERRITIN, TIBC, IRON, RETICCTPCT in the last 72 hours. Sepsis Labs: Recent Labs  Lab 09/30/19 1407 10/01/19 0039  WBC 4.9 5.3   Microbiology No results found for this or any previous visit (from the past 240 hour(s)).   Medications:   . amLODipine  10 mg Oral Daily  . aspirin  325 mg Oral Daily  . atorvastatin  40 mg Oral q1800  . carvedilol  6.25 mg Oral BID WC  . feeding supplement (ENSURE ENLIVE)  237 mL Oral BID BM  . hydrochlorothiazide  12.5 mg Oral Daily  . isosorbide-hydrALAZINE  1 tablet Oral TID  . lisinopril  20 mg Oral Daily  . loratadine  10 mg Oral Daily  . multivitamin with minerals  1 tablet Oral Daily  . polyethylene glycol  17 g Oral Daily   Continuous Infusions: . sodium chloride 10 mL/hr at 10/01/19 1041      LOS: 4 days   Charlynne Cousins  [facility]  10/04/2019, 8:45 AM

## 2019-10-04 NOTE — TOC Initial Note (Signed)
Transition of Care [facility]) - Initial/Assessment Note    Patient Details  Name: Alison Whitehead MRN: [id number] Date of Birth: 1950-12-07  Transition of Care [facility]) CM/SW Contact:    Pollie Friar, RN Phone Number: 10/04/2019, 2:07 PM  Clinical Narrative:                 Awaiting insurance authorization for CIR. Pt provided substance abuse resources for inpatient/ outpatient counseling.  TOC following.  Expected Discharge Plan: IP Rehab Facility Barriers to Discharge: Continued Medical Work up, Ship broker   Patient Goals and CMS Choice     Choice offered to / list presented to : Patient  Expected Discharge Plan and Services Expected Discharge Plan: [place]   Discharge Planning Services: CM Consult   Living arrangements for the past 2 months: Single Family Home                                      Prior Living Arrangements/Services Living arrangements for the past 2 months: Single Family Home Lives with:: Self Patient language and need for interpreter reviewed:: Yes Do you feel safe going back to the place where you live?: Yes      Need for Family Participation in Patient Care: Yes (Comment) Care giver support system in place?: No (comment)   Criminal Activity/Legal Involvement Pertinent to Current Situation/Hospitalization: No - Comment as needed  Activities of Daily Living Home Assistive Devices/Equipment: None ADL Screening (condition at time of admission) Patient's cognitive ability adequate to safely complete daily activities?: Yes Is the patient deaf or have difficulty hearing?: No Does the patient have difficulty seeing, even when wearing glasses/contacts?: No Does the patient have difficulty concentrating, remembering, or making decisions?: No Patient able to express need for assistance with ADLs?: Yes Does the patient have difficulty dressing or bathing?: No Independently performs ADLs?: Yes (appropriate for developmental  age) Does the patient have difficulty walking or climbing stairs?: No Weakness of Legs: None Weakness of Arms/Hands: None  Permission Sought/Granted                  Emotional Assessment Appearance:: Appears stated age Attitude/Demeanor/Rapport: Engaged Affect (typically observed): Accepting Orientation: : Oriented to Self, Oriented to Place, Oriented to  Time, Oriented to Situation Alcohol / Substance Use: Illicit Drugs Psych Involvement: No (comment)  Admission diagnosis:  Dizziness [R42] Acute CVA (cerebrovascular accident) ([place]) [I63.9] Acute cerebrovascular accident (CVA) [facility]) [I63.9] Patient Active Problem List   Diagnosis Date Noted  . Acute cerebrovascular accident (CVA) ([place]) 09/30/2019  . Unilateral primary osteoarthritis, left knee 03/17/2019  . Unilateral primary osteoarthritis, right knee 03/17/2019  . Bilateral primary osteoarthritis of knee 03/10/2019  . INSOMNIA, CHRONIC 05/19/2007  . SPONDYLOSIS, LUMBAR 05/19/2007  . Essential hypertension 05/03/2007  . ARTHRITIS, KNEE 05/03/2007   PCP:  Patient, No Pcp Per Pharmacy:   [facility], [place] - [address] [address] Phone: [phone] Fax: [phone]     Social Determinants of Health (SDOH) Interventions    Readmission Risk Interventions No flowsheet data found.

## 2019-10-05 LAB — BASIC METABOLIC PANEL
Anion gap: 9 (ref 5–15)
BUN: 16 mg/dL (ref 8–23)
CO2: 30 mmol/L (ref 22–32)
Calcium: 9 mg/dL (ref 8.9–10.3)
Chloride: 96 mmol/L — ABNORMAL LOW (ref 98–111)
Creatinine, Ser: 0.98 mg/dL (ref 0.44–1.00)
GFR calc Af Amer: >60 mL/min (ref >60)
GFR calc non Af Amer: 60 mL/min — ABNORMAL LOW (ref >60)
Glucose, Bld: 97 mg/dL (ref 70–99)
Potassium: 4.3 mmol/L (ref 3.5–5.1)
Sodium: 135 mmol/L (ref 135–145)

## 2019-10-05 NOTE — Progress Notes (Signed)
[facility] PROGRESS NOTE    Progress Note  Alison Whitehead  [id number] DOB: October 22, 1950 DOA: 09/30/2019 PCP: Patient, No Pcp Per     Brief Narrative:   Alison Whitehead is an 69 y.o. female past medical history significant for hypertension, DJD IBS who presents with dizziness and nausea started on the day of admission she relates she woke up in the room was spinning around her and almost fell to the ground became nauseated and vomiting.  MRI of the brain showed acute/subacute early microhemorrhage or hemorrhagic lacunar infarct in the posterior pons, chronic small vessel ischemia with multiple lacunar infarcts in the basal ganglia bilaterally and thalamus.  She also had multiple supratentorial chronic microhemorrhagic changes question hypertensive microangiopathy.  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  They recommended to keep her goal blood pressure less than 140/80. Pressure continues to improve, continue hydrochlorothiazide, Norvasc, lisinopril, Coreg and BiDil. Physical therapy evaluated the patient and recommended CIR awaiting insurance approval.  Essential hypertension: Blood pressure is improved, will continue current regimen.  Bilateral primary osteoarthritis of knee: Continue tramadol for pain avoid ibuprofen.  Abnormal EKG: Likely due to longstanding uncontrolled hypertension.   DVT prophylaxis: lovenox Family Communication:none Disposition Plan/Barrier to D/C: Physical therapy evaluated the patient recommended inpatient rehab.  Awaiting insurance approval for CIR.  Code Status:     Code Status Orders  (From admission, onward)         Start     Ordered   10/01/19 0011  Full code  Continuous     10/01/19 0012        Code Status History    This patient has a  current code status but no historical code status.   Advance Care Planning Activity        IV Access:    Peripheral IV   Procedures and diagnostic studies:   No results found.   Medical Consultants:    None.  Anti-Infectives:   None  Subjective:    Alison Whitehead she is now complaining of left-sided face pain  Objective:    Vitals:   10/04/19 2335 10/05/19 0356 10/05/19 0357 10/05/19 0817  BP: 109/72 (!) 122/36 125/75 115/69  Pulse: 82 82 75 68  Resp: 17 18 17 16   Temp: 98.7 F (37.1 C) 98.1 F (36.7 C) 98.3 F (36.8 C) 97.9 F (36.6 C)  TempSrc: Oral Oral Oral Oral  SpO2: 95% 97% 100% 99%  Weight:      Height:       SpO2: 99 %   Intake/Output Summary (Last 24 hours) at 10/05/2019 1002 Last data filed at 10/05/2019 0645 Gross per 24 hour  Intake --  Output 300 ml  Net -300 ml   Filed Weights   09/30/19 2345  Weight: 83.5 kg    Exam: General exam: In no acute distress. Respiratory system: Good air movement and clear to auscultation. Cardiovascular system: S1 & S2 heard, RRR. No JVD. Gastrointestinal system: Abdomen is nondistended, soft and nontender.  Extremities: No pedal edema. Skin: The left side of her face is not warm to touch or erythematous it is tender, mildly swollen.  Data Reviewed:    Labs: Basic Metabolic Panel: Recent Labs  Lab 09/30/19 1407 09/30/19 1407 10/01/19 0039 10/01/19 0039 10/04/19 0328 10/05/19 0648  NA 143  --  142  --  138 135  K 4.2   < >  4.3   < > 4.2 4.3  CL 105  --  106  --  101 96*  CO2 27  --  27  --  30 30  GLUCOSE 163*  --  130*  --  109* 97  BUN 23  --  16  --  19 16  CREATININE 1.00  --  1.01*  --  1.01* 0.98  CALCIUM 8.9  --  8.8*  --  9.1 9.0   < > = values in this interval not displayed.   GFR Estimated Creatinine Clearance: 61.9 mL/min (by C-G formula based on SCr of 0.98 mg/dL). Liver Function Tests: Recent Labs  Lab 09/30/19 1407 10/01/19 0039  AST 16 16  ALT 16 14   ALKPHOS 65 61  BILITOT 0.6 0.8  PROT 7.2 7.1  ALBUMIN 3.7 3.5   Recent Labs  Lab 09/30/19 1407  LIPASE 24   No results for input(s): AMMONIA in the last 168 hours. Coagulation profile No results for input(s): INR, PROTIME in the last 168 hours. COVID-19 Labs  No results for input(s): DDIMER, FERRITIN, LDH, CRP in the last 72 hours.  No results found for: SARSCOV2NAA  CBC: Recent Labs  Lab 09/30/19 1407 10/01/19 0039  WBC 4.9 5.3  HGB 14.2 14.1  HCT 46.5* 45.9  MCV 90.3 89.3  PLT 259 305   Cardiac Enzymes: No results for input(s): CKTOTAL, CKMB, CKMBINDEX, TROPONINI in the last 168 hours. BNP (last 3 results) No results for input(s): PROBNP in the last 8760 hours. CBG: No results for input(s): GLUCAP in the last 168 hours. D-Dimer: No results for input(s): DDIMER in the last 72 hours. Hgb A1c: Recent Labs    10/02/19 1458  HGBA1C 6.2*   Lipid Profile: No results for input(s): CHOL, HDL, LDLCALC, TRIG, CHOLHDL, LDLDIRECT in the last 72 hours. Thyroid function studies: No results for input(s): TSH, T4TOTAL, T3FREE, THYROIDAB in the last 72 hours.  Invalid input(s): FREET3 Anemia work up: No results for input(s): VITAMINB12, FOLATE, FERRITIN, TIBC, IRON, RETICCTPCT in the last 72 hours. Sepsis Labs: Recent Labs  Lab 09/30/19 1407 10/01/19 0039  WBC 4.9 5.3   Microbiology No results found for this or any previous visit (from the past 240 hour(s)).   Medications:   . amLODipine  10 mg Oral Daily  . aspirin  325 mg Oral Daily  . atorvastatin  40 mg Oral q1800  . carvedilol  6.25 mg Oral BID WC  . feeding supplement (ENSURE ENLIVE)  237 mL Oral BID BM  . hydrochlorothiazide  12.5 mg Oral Daily  . isosorbide-hydrALAZINE  1 tablet Oral TID  . lisinopril  20 mg Oral Daily  . loratadine  10 mg Oral Daily  . multivitamin with minerals  1 tablet Oral Daily  . polyethylene glycol  17 g Oral Daily   Continuous Infusions: . sodium chloride 10 mL/hr at  10/01/19 1041      LOS: 5 days   Charlynne Cousins  [facility]  10/05/2019, 10:02 AM

## 2019-10-05 NOTE — Progress Notes (Signed)
Physical Therapy Treatment Patient Details Name: Alison Whitehead MRN: [id number] DOB: Nov 07, 1950 Today's Date: 10/05/2019    History of Present Illness Alison Whitehead is an 69 y.o. female past medical history significant for hypertension, DJD IBS who presents with dizziness and nausea. MRI of the brain showed acute/subacute early microhemorrhage or hemorrhagic lacunar infarct in the posterior pons, chronic small vessel ischemia with multiple lacunar infarcts in the basal ganglia bilaterally and thalamus.    PT Comments    Pt limited to supine exercises due to lethargy. Will continue to recommend CIR with plans for tx in am.  She is resting in bed and reports she has not been sleeping well.      Follow Up Recommendations  CIR     Equipment Recommendations  Other (comment)(TBA)    Recommendations for Other Services       Precautions / Restrictions Precautions Precautions: Fall Restrictions Weight Bearing Restrictions: No    Mobility  Bed Mobility Overal bed mobility: Needs Assistance             General bed mobility comments: cues to boost to [facility].  Transfers                    Ambulation/Gait                 Stairs             Wheelchair Mobility    Modified Rankin (Stroke Patients Only)       Balance Overall balance assessment: Needs assistance Sitting-balance support: No upper extremity supported;Feet supported Sitting balance-Leahy Scale: Fair       Standing balance-Leahy Scale: Poor                              Cognition Arousal/Alertness: Lethargic;Suspect due to medications Behavior During Therapy: [facility] for tasks assessed/performed Overall Cognitive Status: (did not formally assess as patient was very sleepy,  She did wake for exercises but refused OOB this session.)                                        Exercises General Exercises - Lower Extremity Ankle Circles/Pumps:  AROM;Both;Supine;20 reps Quad Sets: AROM;Both;10 reps;Supine Heel Slides: AROM;Both;10 reps;Supine Hip ABduction/ADduction: AROM;Both;10 reps;Supine Straight Leg Raises: AROM;Both;10 reps;Supine    General Comments        Pertinent Vitals/Pain Pain Assessment: No/denies pain    Home Living                      Prior Function            PT Goals (current goals can now be found in the care plan section) Acute Rehab PT Goals Patient Stated Goal: Go home Potential to Achieve Goals: Good Progress towards PT goals: Progressing toward goals    Frequency    Min 4X/week      PT Plan Current plan remains appropriate    Co-evaluation              AM-PAC PT "6 Clicks" Mobility   Outcome Measure  Help needed turning from your back to your side while in a flat bed without using bedrails?: None Help needed moving from lying on your back to sitting on the side of a flat bed without using bedrails?: A Little Help needed moving to and  from a bed to a chair (including a wheelchair)?: A Little Help needed standing up from a chair using your arms (e.g., wheelchair or bedside chair)?: A Little Help needed to walk in hospital room?: A Little Help needed climbing 3-5 steps with a railing? : A Little 6 Click Score: 19    End of Session   Activity Tolerance: Patient limited by lethargy Patient left: in bed;with call bell/phone within reach;with bed alarm set Nurse Communication: Mobility status PT Visit Diagnosis: Unsteadiness on feet (R26.81);Difficulty in walking, not elsewhere classified (R26.2);Other symptoms and signs involving the nervous system (R29.898)     Time: [id number] PT Time Calculation (min) (ACUTE ONLY): 9 min  Charges:  $Therapeutic Exercise: 8-22 mins                     Erasmo Leventhal , PTA Acute Rehabilitation Services Pager [phone] Office [phone]     Alison Whitehead Alison Whitehead 10/05/2019, 6:15 PM

## 2019-10-05 NOTE — Progress Notes (Signed)
Occupational Therapy Treatment Patient Details Name: Alison Whitehead MRN: [id number] DOB: March 08, 1951 Today's Date: 10/05/2019    History of present illness Kirstina Bagdonas is an 69 y.o. female past medical history significant for hypertension, DJD IBS who presents with dizziness and nausea. MRI of the brain showed acute/subacute early microhemorrhage or hemorrhagic lacunar infarct in the posterior pons, chronic small vessel ischemia with multiple lacunar infarcts in the basal ganglia bilaterally and thalamus.   OT comments  Patient supine in bed and agreeable to OT session. Patient requires min assist for transfers and in room mobility using RW, min guard to supervision for grooming at sink and min assist for LB bathing at sink. Remains limited by impaired balance, dizziness, and cognition (attention, safety awareness and problem solving).  Continue to recommend CIR. Will follow acutely.    Follow Up Recommendations  CIR;Supervision/Assistance - 24 hour    Equipment Recommendations  3 in 1 bedside commode;Other (comment)(RW)    Recommendations for Other Services      Precautions / Restrictions Precautions Precautions: Fall Restrictions Weight Bearing Restrictions: No       Mobility Bed Mobility Overal bed mobility: Needs Assistance Bed Mobility: Supine to Sit;Sit to Supine     Supine to sit: Min guard Sit to supine: Min guard   General bed mobility comments: Min Guard A for safety  Transfers Overall transfer level: Needs assistance Equipment used: Rolling walker (2 wheeled) Transfers: Sit to/from Stand Sit to Stand: Min assist         General transfer comment: cueing for hand placement and technique     Balance Overall balance assessment: Needs assistance Sitting-balance support: No upper extremity supported;Feet supported Sitting balance-Leahy Scale: Fair     Standing balance support: Bilateral upper extremity supported;During functional  activity Standing balance-Leahy Scale: Poor Standing balance comment: relaint on BUE and external support                           ADL either performed or assessed with clinical judgement   ADL Overall ADL's : Needs assistance/impaired     Grooming: Min guard;Sitting;Standing;Wash/dry hands;Wash/dry face Grooming Details (indicate cue type and reason): min guard for safety and balance if standing; supervision sitting     Lower Body Bathing: Minimal assistance;Sit to/from stand Lower Body Bathing Details (indicate cue type and reason): for balance         Toilet Transfer: Minimal assistance;Ambulation;RW Toilet Transfer Details (indicate cue type and reason): simulated in room         Functional mobility during ADLs: Minimal assistance;Rolling walker General ADL Comments: limited by dizziness, educated on visual focal point but poor attention and safety limiting ADL independence     Vision       Perception     Praxis      Cognition Arousal/Alertness: Awake/alert Behavior During Therapy: WFL for tasks assessed/performed Overall Cognitive Status: Impaired/Different from baseline Area of Impairment: Attention;Safety/judgement;Problem solving;Awareness;Following commands                   Current Attention Level: Sustained   Following Commands: Follows one step commands consistently;Follows one step commands with increased time;Follows multi-step commands with increased time Safety/Judgement: Decreased awareness of safety;Decreased awareness of deficits Awareness: Emergent Problem Solving: Slow processing;Requires verbal cues;Requires tactile cues General Comments: patient with requiring cueing for safety awareness and problem solving, decreased attention and requires redirection; not impulsive during OT session today        Exercises  Shoulder Instructions       General Comments remains limited by dizziness, pt reports dizziness resolves  only in supine     Pertinent Vitals/ Pain       Pain Assessment: No/denies pain  Home Living                                          Prior Functioning/Environment              Frequency  Min 2X/week        Progress Toward Goals  OT Goals(current goals can now be found in the care plan section)  Progress towards OT goals: Progressing toward goals  Acute Rehab OT Goals Patient Stated Goal: Go home OT Goal Formulation: With patient  Plan Discharge plan remains appropriate;Frequency remains appropriate    Co-evaluation                 AM-PAC OT "6 Clicks" Daily Activity     Outcome Measure   Help from another person eating meals?: None Help from another person taking care of personal grooming?: A Little Help from another person toileting, which includes using toliet, bedpan, or urinal?: A Little Help from another person bathing (including washing, rinsing, drying)?: A Little Help from another person to put on and taking off regular upper body clothing?: A Little Help from another person to put on and taking off regular lower body clothing?: A Little 6 Click Score: 19    End of Session Equipment Utilized During Treatment: Rolling walker;Gait belt  OT Visit Diagnosis: Unsteadiness on feet (R26.81);Other abnormalities of gait and mobility (R26.89);Muscle weakness (generalized) (M62.81)   Activity Tolerance Patient tolerated treatment well   Patient Left in bed;with call bell/phone within reach;with bed alarm set   Nurse Communication Mobility status        Time: [id number] OT Time Calculation (min): 18 min  Charges: OT General Charges $OT Visit: 1 Visit OT Treatments $Self Care/Home Management : 8-22 mins  Jolaine Artist, OT Acute Rehabilitation Services Pager [phone] Office [phone]    Delight Stare 10/05/2019, 1:12 PM

## 2019-10-05 NOTE — Progress Notes (Signed)
Pt c/o 8/10 discomfort at left side of neck. It appears as the glands are swollen. Pt endorses that she noticed this yesterday.

## 2019-10-06 ENCOUNTER — Inpatient Hospital Stay (HOSPITAL_COMMUNITY)
Admission: RE | Admit: 2019-10-06 | Discharge: 2019-10-29 | DRG: 057 | Disposition: A | Discharge status: Home Health | Payer: Medicare HMO | Source: Intra-hospital | Attending: Physical Medicine & Rehabilitation | Admitting: Physical Medicine & Rehabilitation

## 2019-10-06 ENCOUNTER — Encounter (HOSPITAL_COMMUNITY): Payer: Self-pay | Admitting: Physical Medicine & Rehabilitation

## 2019-10-06 DIAGNOSIS — F17200 Nicotine dependence, unspecified, uncomplicated: Secondary | ICD-10-CM | POA: Diagnosis present

## 2019-10-06 DIAGNOSIS — T502X5A Adverse effect of carbonic-anhydrase inhibitors, benzothiadiazides and other diuretics, initial encounter: Secondary | ICD-10-CM | POA: Diagnosis present

## 2019-10-06 DIAGNOSIS — R110 Nausea: Secondary | ICD-10-CM | POA: Diagnosis not present

## 2019-10-06 DIAGNOSIS — R7989 Other specified abnormal findings of blood chemistry: Secondary | ICD-10-CM | POA: Diagnosis not present

## 2019-10-06 DIAGNOSIS — G4700 Insomnia, unspecified: Secondary | ICD-10-CM | POA: Diagnosis present

## 2019-10-06 DIAGNOSIS — I959 Hypotension, unspecified: Secondary | ICD-10-CM | POA: Diagnosis not present

## 2019-10-06 DIAGNOSIS — I951 Orthostatic hypotension: Secondary | ICD-10-CM | POA: Diagnosis not present

## 2019-10-06 DIAGNOSIS — N179 Acute kidney failure, unspecified: Secondary | ICD-10-CM | POA: Diagnosis present

## 2019-10-06 DIAGNOSIS — M1711 Unilateral primary osteoarthritis, right knee: Secondary | ICD-10-CM | POA: Diagnosis present

## 2019-10-06 DIAGNOSIS — F1410 Cocaine abuse, uncomplicated: Secondary | ICD-10-CM | POA: Diagnosis present

## 2019-10-06 DIAGNOSIS — I6350 Cerebral infarction due to unspecified occlusion or stenosis of unspecified cerebral artery: Secondary | ICD-10-CM | POA: Diagnosis not present

## 2019-10-06 DIAGNOSIS — Z79899 Other long term (current) drug therapy: Secondary | ICD-10-CM

## 2019-10-06 DIAGNOSIS — R7309 Other abnormal glucose: Secondary | ICD-10-CM

## 2019-10-06 DIAGNOSIS — Z833 Family history of diabetes mellitus: Secondary | ICD-10-CM | POA: Diagnosis not present

## 2019-10-06 DIAGNOSIS — M1712 Unilateral primary osteoarthritis, left knee: Secondary | ICD-10-CM | POA: Diagnosis present

## 2019-10-06 DIAGNOSIS — M170 Bilateral primary osteoarthritis of knee: Secondary | ICD-10-CM | POA: Diagnosis present

## 2019-10-06 DIAGNOSIS — E869 Volume depletion, unspecified: Secondary | ICD-10-CM | POA: Diagnosis not present

## 2019-10-06 DIAGNOSIS — Z7982 Long term (current) use of aspirin: Secondary | ICD-10-CM | POA: Diagnosis not present

## 2019-10-06 DIAGNOSIS — I69398 Other sequelae of cerebral infarction: Principal | ICD-10-CM

## 2019-10-06 DIAGNOSIS — G441 Vascular headache, not elsewhere classified: Secondary | ICD-10-CM

## 2019-10-06 DIAGNOSIS — I679 Cerebrovascular disease, unspecified: Secondary | ICD-10-CM | POA: Diagnosis not present

## 2019-10-06 DIAGNOSIS — R7303 Prediabetes: Secondary | ICD-10-CM

## 2019-10-06 DIAGNOSIS — Z791 Long term (current) use of non-steroidal anti-inflammatories (NSAID): Secondary | ICD-10-CM

## 2019-10-06 DIAGNOSIS — I10 Essential (primary) hypertension: Secondary | ICD-10-CM | POA: Diagnosis present

## 2019-10-06 LAB — BASIC METABOLIC PANEL
Anion gap: 14 (ref 5–15)
BUN: 24 mg/dL — ABNORMAL HIGH (ref 8–23)
CO2: 23 mmol/L (ref 22–32)
Calcium: 8.8 mg/dL — ABNORMAL LOW (ref 8.9–10.3)
Chloride: 99 mmol/L (ref 98–111)
Creatinine, Ser: 1.21 mg/dL — ABNORMAL HIGH (ref 0.44–1.00)
GFR calc Af Amer: 54 mL/min — ABNORMAL LOW (ref >60)
GFR calc non Af Amer: 46 mL/min — ABNORMAL LOW (ref >60)
Glucose, Bld: 111 mg/dL — ABNORMAL HIGH (ref 70–99)
Potassium: 4.8 mmol/L (ref 3.5–5.1)
Sodium: 136 mmol/L (ref 135–145)

## 2019-10-06 LAB — GLUCOSE, CAPILLARY
Glucose-Capillary: 115 mg/dL — ABNORMAL HIGH (ref 70–99)
Glucose-Capillary: 117 mg/dL — ABNORMAL HIGH (ref 70–99)

## 2019-10-06 MED ORDER — MECLIZINE HCL 25 MG PO TABS
12.5000 mg | ORAL_TABLET | Freq: Three times a day (TID) | ORAL | Status: DC | PRN
  Administered 2019-10-11 – 2019-10-12 (×2): 12.5 mg via ORAL
  Filled 2019-10-06 (×2): qty 1

## 2019-10-06 MED ORDER — ASPIRIN 325 MG PO TABS
325.0000 mg | ORAL_TABLET | Freq: Every day | ORAL | Status: DC
  Administered 2019-10-07 – 2019-10-29 (×23): 325 mg via ORAL
  Filled 2019-10-06 (×23): qty 1

## 2019-10-06 MED ORDER — INSULIN ASPART 100 UNIT/ML ~~LOC~~ SOLN
0.0000 [IU] | Freq: Three times a day (TID) | SUBCUTANEOUS | Status: DC
  Administered 2019-10-07 – 2019-10-29 (×15): 1 [IU] via SUBCUTANEOUS

## 2019-10-06 MED ORDER — CARVEDILOL 6.25 MG PO TABS
6.2500 mg | ORAL_TABLET | Freq: Two times a day (BID) | ORAL | Status: DC
  Administered 2019-10-06 – 2019-10-29 (×44): 6.25 mg via ORAL
  Filled 2019-10-06 (×45): qty 1

## 2019-10-06 MED ORDER — FLEET ENEMA 7-19 GM/118ML RE ENEM: 1.0000 | ENEMA | Freq: Once | RECTAL | Status: DC | PRN

## 2019-10-06 MED ORDER — GUAIFENESIN-DM 100-10 MG/5ML PO SYRP: 5.0000 mL | ORAL_SOLUTION | Freq: Four times a day (QID) | ORAL | Status: DC | PRN

## 2019-10-06 MED ORDER — ATORVASTATIN CALCIUM 40 MG PO TABS: 40.0000 mg | ORAL_TABLET | Freq: Every day | ORAL | Status: DC

## 2019-10-06 MED ORDER — ATORVASTATIN CALCIUM 40 MG PO TABS
40.0000 mg | ORAL_TABLET | Freq: Every day | ORAL | Status: DC
  Administered 2019-10-06 – 2019-10-28 (×22): 40 mg via ORAL
  Filled 2019-10-06 (×23): qty 1

## 2019-10-06 MED ORDER — AMLODIPINE BESYLATE 10 MG PO TABS
10.0000 mg | ORAL_TABLET | Freq: Every day | ORAL | Status: DC
  Administered 2019-10-07: 10 mg via ORAL
  Filled 2019-10-06 (×3): qty 1

## 2019-10-06 MED ORDER — DIPHENHYDRAMINE HCL 12.5 MG/5ML PO ELIX
12.5000 mg | ORAL_SOLUTION | Freq: Four times a day (QID) | ORAL | Status: DC | PRN
  Administered 2019-10-10 – 2019-10-20 (×4): 25 mg via ORAL
  Administered 2019-10-23: 22:00:00 12.5 mg via ORAL
  Administered 2019-10-26 – 2019-10-27 (×2): 25 mg via ORAL
  Filled 2019-10-06 (×7): qty 10

## 2019-10-06 MED ORDER — ENOXAPARIN SODIUM 40 MG/0.4ML ~~LOC~~ SOLN
40.0000 mg | SUBCUTANEOUS | Status: DC
  Administered 2019-10-06 – 2019-10-28 (×23): 40 mg via SUBCUTANEOUS
  Filled 2019-10-06 (×23): qty 0.4

## 2019-10-06 MED ORDER — MECLIZINE HCL 12.5 MG PO TABS: 12.5000 mg | ORAL_TABLET | Freq: Two times a day (BID) | ORAL | 0 refills | Status: DC | PRN

## 2019-10-06 MED ORDER — DICLOFENAC SODIUM 1 % EX GEL
2.0000 g | Freq: Four times a day (QID) | CUTANEOUS | Status: DC
  Administered 2019-10-06 – 2019-10-29 (×70): 2 g via TOPICAL
  Filled 2019-10-06: qty 100

## 2019-10-06 MED ORDER — ADULT MULTIVITAMIN W/MINERALS CH
1.0000 | ORAL_TABLET | Freq: Every day | ORAL | Status: DC
  Administered 2019-10-07 – 2019-10-29 (×23): 1 via ORAL
  Filled 2019-10-06 (×23): qty 1

## 2019-10-06 MED ORDER — PROCHLORPERAZINE EDISYLATE 10 MG/2ML IJ SOLN: 5.0000 mg | Freq: Four times a day (QID) | INTRAMUSCULAR | Status: DC | PRN

## 2019-10-06 MED ORDER — PROCHLORPERAZINE 25 MG RE SUPP: 12.5000 mg | Freq: Four times a day (QID) | RECTAL | Status: DC | PRN

## 2019-10-06 MED ORDER — LORATADINE 10 MG PO TABS
10.0000 mg | ORAL_TABLET | Freq: Every day | ORAL | Status: DC
  Administered 2019-10-07 – 2019-10-29 (×23): 10 mg via ORAL
  Filled 2019-10-06 (×24): qty 1

## 2019-10-06 MED ORDER — ACETAMINOPHEN 325 MG PO TABS
325.0000 mg | ORAL_TABLET | ORAL | Status: DC | PRN
  Administered 2019-10-08: 650 mg via ORAL
  Administered 2019-10-09: 325 mg via ORAL
  Administered 2019-10-10 – 2019-10-29 (×44): 650 mg via ORAL
  Filled 2019-10-06 (×48): qty 2

## 2019-10-06 MED ORDER — TROLAMINE SALICYLATE 10 % EX CREA: TOPICAL_CREAM | Freq: Two times a day (BID) | CUTANEOUS | Status: DC | PRN

## 2019-10-06 MED ORDER — PROCHLORPERAZINE MALEATE 5 MG PO TABS
5.0000 mg | ORAL_TABLET | Freq: Four times a day (QID) | ORAL | Status: DC | PRN
  Administered 2019-10-08 (×2): 5 mg via ORAL
  Administered 2019-10-09 – 2019-10-24 (×6): 10 mg via ORAL
  Filled 2019-10-06 (×7): qty 2

## 2019-10-06 MED ORDER — ISOSORB DINITRATE-HYDRALAZINE 20-37.5 MG PO TABS
1.0000 | ORAL_TABLET | Freq: Three times a day (TID) | ORAL | Status: DC
  Administered 2019-10-06 – 2019-10-19 (×35): 1 via ORAL
  Filled 2019-10-06 (×41): qty 1

## 2019-10-06 MED ORDER — INSULIN ASPART 100 UNIT/ML ~~LOC~~ SOLN
0.0000 [IU] | Freq: Every day | SUBCUTANEOUS | Status: DC
  Administered 2019-10-17: 2 [IU] via SUBCUTANEOUS

## 2019-10-06 MED ORDER — BISACODYL 10 MG RE SUPP: 10.0000 mg | Freq: Every day | RECTAL | Status: DC | PRN

## 2019-10-06 MED ORDER — HYDROCHLOROTHIAZIDE 12.5 MG PO CAPS: 12.5000 mg | ORAL_CAPSULE | Freq: Every day | ORAL | Status: DC

## 2019-10-06 MED ORDER — SENNOSIDES-DOCUSATE SODIUM 8.6-50 MG PO TABS
2.0000 | ORAL_TABLET | Freq: Every day | ORAL | Status: DC
  Administered 2019-10-06 – 2019-10-28 (×23): 2 via ORAL
  Filled 2019-10-06 (×22): qty 2

## 2019-10-06 MED ORDER — LISINOPRIL 20 MG PO TABS
20.0000 mg | ORAL_TABLET | Freq: Every day | ORAL | Status: DC
  Administered 2019-10-07: 20 mg via ORAL
  Filled 2019-10-06: qty 1

## 2019-10-06 MED ORDER — POLYETHYLENE GLYCOL 3350 17 G PO PACK
17.0000 g | PACK | Freq: Every day | ORAL | Status: DC | PRN
  Filled 2019-10-06: qty 1

## 2019-10-06 MED ORDER — HYDROCHLOROTHIAZIDE 12.5 MG PO CAPS
12.5000 mg | ORAL_CAPSULE | Freq: Every day | ORAL | Status: DC
  Administered 2019-10-07: 12.5 mg via ORAL
  Filled 2019-10-06: qty 1

## 2019-10-06 MED ORDER — CARVEDILOL 6.25 MG PO TABS: 6.2500 mg | ORAL_TABLET | Freq: Two times a day (BID) | ORAL | Status: DC

## 2019-10-06 MED ORDER — ALUM & MAG HYDROXIDE-SIMETH 200-200-20 MG/5ML PO SUSP
30.0000 mL | ORAL | Status: DC | PRN
  Administered 2019-10-08: 30 mL via ORAL
  Filled 2019-10-06: qty 30

## 2019-10-06 MED ORDER — ASPIRIN EC 81 MG PO TBEC: 81.0000 mg | DELAYED_RELEASE_TABLET | Freq: Every day | ORAL | Status: DC

## 2019-10-06 MED ORDER — MUSCLE RUB 10-15 % EX CREA
TOPICAL_CREAM | Freq: Two times a day (BID) | CUTANEOUS | Status: DC | PRN
  Filled 2019-10-06: qty 85

## 2019-10-06 MED ORDER — TOPIRAMATE 25 MG PO TABS
25.0000 mg | ORAL_TABLET | Freq: Every day | ORAL | Status: DC
  Administered 2019-10-06 – 2019-10-13 (×8): 25 mg via ORAL
  Filled 2019-10-06 (×8): qty 1

## 2019-10-06 MED ORDER — TRAMADOL HCL 50 MG PO TABS
50.0000 mg | ORAL_TABLET | Freq: Four times a day (QID) | ORAL | Status: DC | PRN
  Administered 2019-10-07 – 2019-10-19 (×2): 50 mg via ORAL
  Filled 2019-10-06 (×3): qty 1

## 2019-10-06 MED ORDER — TRAZODONE HCL 50 MG PO TABS
25.0000 mg | ORAL_TABLET | Freq: Every evening | ORAL | Status: DC | PRN
  Administered 2019-10-11 – 2019-10-13 (×2): 50 mg via ORAL
  Filled 2019-10-06 (×3): qty 1

## 2019-10-06 MED ORDER — POLYETHYLENE GLYCOL 3350 17 G PO PACK
17.0000 g | PACK | Freq: Every day | ORAL | Status: DC
  Administered 2019-10-10 – 2019-10-27 (×8): 17 g via ORAL
  Filled 2019-10-06 (×20): qty 1

## 2019-10-06 MED ORDER — ENSURE ENLIVE PO LIQD
237.0000 mL | Freq: Two times a day (BID) | ORAL | Status: DC
  Administered 2019-10-06 – 2019-10-07 (×2): 237 mL via ORAL

## 2019-10-06 NOTE — Progress Notes (Signed)
Inpatient Rehab Admissions:  I have insurance authorization and a bed available for pt to admit to CIR today. Will let pt/CM know.   Signed: Shann Medal, PT, DPT Admissions Coordinator [phone] 10/06/19  9:27 AM

## 2019-10-06 NOTE — Progress Notes (Addendum)
Physical Therapy Treatment Patient Details Name: Alison Whitehead MRN: [id number] DOB: [birth year] Today's Date: 10/06/2019    History of Present Illness Alison Whitehead is an 70 y.o. female past medical history significant for hypertension, DJD IBS who presents with dizziness and nausea. MRI of the brain showed acute/subacute early microhemorrhage or hemorrhagic lacunar infarct in the posterior pons, chronic small vessel ischemia with multiple lacunar infarcts in the basal ganglia bilaterally and thalamus.    PT Comments    Pt performed gt training and functional mobility this session.  She continues to be tired this session but required max VCs and encouragement to move OOB.  Pt required assistance to transfer into standing with moderate assistance and min assistance to gt train.  Continue to recommend aggressive rehab at [facility] before returning home.  Pt should be able to make functional gains to return to baseline of MOD I before returning home.    Follow Up Recommendations  CIR     Equipment Recommendations  Other (comment)(TBA)    Recommendations for Other Services       Precautions / Restrictions Precautions Precautions: Fall Restrictions Weight Bearing Restrictions: No    Mobility  Bed Mobility Overal bed mobility: Needs Assistance Bed Mobility: Supine to Sit;Sit to Supine     Supine to sit: Min guard Sit to supine: Min guard      Transfers Overall transfer level: Needs assistance Equipment used: Rolling walker (2 wheeled) Transfers: Sit to/from Stand Sit to Stand: Mod assist         General transfer comment: Mod assistanced to boost into standing with cues for hand placement.  Pt continues to present with poor eccentric load back to bed.  Ambulation/Gait Ambulation/Gait assistance: Min assist Gait Distance (Feet): 100 Feet Assistive device: Rolling walker (2 wheeled) Gait Pattern/deviations: Step-through pattern;Staggering right;Ataxic;Trunk  flexed;Scissoring;Narrow base of support     General Gait Details: Pt continues to push device too far forward and remains with difficulty following commands to stay safe in device.  Facilitation for hip and trunk extension.   Stairs             Wheelchair Mobility    Modified Rankin (Stroke Patients Only) Modified Rankin (Stroke Patients Only) Pre-Morbid Rankin Score: No symptoms Modified Rankin: Moderately severe disability     Balance Overall balance assessment: Needs assistance   Sitting balance-Leahy Scale: Fair     Standing balance support: Bilateral upper extremity supported;During functional activity Standing balance-Leahy Scale: Poor Standing balance comment: relaint on BUE and external support                            Cognition Arousal/Alertness: Lethargic;Suspect due to medications Behavior During Therapy: [facility] for tasks assessed/performed Overall Cognitive Status: Within Functional Limits for tasks assessed Area of Impairment: Attention;Safety/judgement;Problem solving;Awareness;Following commands                       Following Commands: Follows one step commands consistently Safety/Judgement: Decreased awareness of safety;Decreased awareness of deficits   Problem Solving: Requires verbal cues;Requires tactile cues General Comments: patient with requiring cueing for safety awareness and problem solving, decreased attention and requires redirection      Exercises      General Comments        Pertinent Vitals/Pain Pain Assessment: No/denies pain    Home Living  Prior Function            PT Goals (current goals can now be found in the care plan section) Acute Rehab PT Goals Patient Stated Goal: Go home Potential to Achieve Goals: Good Progress towards PT goals: Progressing toward goals    Frequency    Min 4X/week      PT Plan Current plan remains appropriate    Co-evaluation               AM-PAC PT "6 Clicks" Mobility   Outcome Measure  Help needed turning from your back to your side while in a flat bed without using bedrails?: None Help needed moving from lying on your back to sitting on the side of a flat bed without using bedrails?: A Little Help needed moving to and from a bed to a chair (including a wheelchair)?: A Little Help needed standing up from a chair using your arms (e.g., wheelchair or bedside chair)?: A Little Help needed to walk in hospital room?: A Little Help needed climbing 3-5 steps with a railing? : A Little 6 Click Score: 19    End of Session Equipment Utilized During Treatment: Gait belt Activity Tolerance: Patient limited by lethargy Patient left: in bed;with call bell/phone within reach;with bed alarm set Nurse Communication: Mobility status PT Visit Diagnosis: Unsteadiness on feet (R26.81);Difficulty in walking, not elsewhere classified (R26.2);Other symptoms and signs involving the nervous system (R29.898)     Time: [id number] PT Time Calculation (min) (ACUTE ONLY): 16 min  Charges:  $Gait Training: 8-22 mins                     Erasmo Leventhal , PTA Acute Rehabilitation Services Pager [phone] Office [phone]     Ercie Eliasen Eli Hose 10/06/2019, 11:46 AM

## 2019-10-06 NOTE — Progress Notes (Signed)
Physical Medicine and Rehabilitation Consult     Reason for Consult: Stroke with functional decline.  Referring Physician: Dr. Olevia Bowens     HPI: Alison Whitehead is a 69 y.o. female with history of chronic bilateral knee DJD, lumbar spondylosis,  IBS, cocaine abuse who was admitted on 10/01/19 with weakness, dizziness followed by nausea and vomiting. Patient with history of HTN --no meds x 10 years and UDS positive for opiates and cocaine. 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 CT abdomen/pelvis showed enlarged fibroid uterus with fairly extensive diverticulosis and patchy bibasilar infiltrates. 2D echo showed EF 55-60% with severe dilatation of LV and mild dilation of ascending aorta- 4.3 cm. Carotid dopplers were negative for significant ICA stenosis. Stroke felt to be due to small vessel disease and low dose ASA recommended for secondary stroke prevention. Therapy evaluation showed deficits in higher level cognitive tasks, decreased attention with impulsivity and unsafe behaviors, balance deficits and ongoing vestibular symptoms.  CIR recommended due to functional decline.      Review of Systems  Constitutional: Negative for chills and fever.  HENT: Negative for hearing loss.   Eyes: Negative for blurred vision and double vision.  Respiratory: Negative for cough and shortness of breath.   Cardiovascular: Negative for chest pain and palpitations.  Gastrointestinal: Positive for abdominal pain (due to hernia), constipation, heartburn, nausea and vomiting.  Genitourinary: Negative for dysuria and urgency.  Musculoskeletal: Positive for back pain, joint pain and myalgias.  Skin: Negative for rash.  Neurological: Positive for dizziness,  weakness and headaches.  Psychiatric/Behavioral: The patient is not nervous/anxious and does not have insomnia.           Past Medical History:  Diagnosis Date  . Arthritis    . DDD (degenerative disc disease)    . Degenerative disc disease, lumbar    . Hypertension    . IBS (irritable bowel syndrome)    . Osteopenia             Past Surgical History:  Procedure Laterality Date  . SHOULDER SURGERY               Family History  Problem Relation Age of Onset  . Breast cancer Maternal Aunt    . Diabetes Mother    . Hypertension Mother    . Glaucoma Mother    . Diabetes Sister    . Hypertension Sister    . Cancer Sister        Social History:  Lives alone and independent PTA. 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  She reports that she has been smoking. She has never used smokeless tobacco. She reports that she does not drink alcohol or use drugs.          Allergies  Allergen Reactions  . Penicillins Itching            Medications Prior to Admission  Medication Sig Dispense Refill  . amlodipine-benazepril (LOTREL) 2.5-10 MG capsule Take 1 capsule by mouth daily.      . diclofenac sodium (VOLTAREN) 1 % GEL Apply 2-4 g topically 4 (four) times daily. (Patient taking differently: Apply 2-4 g topically 4 (four) times daily as needed (pain). ) 500 g 1  . HYDROcodone-acetaminophen (NORCO) 10-325 MG tablet Take 1 tablet by  mouth 3 (three) times daily as needed. (Patient taking differently: Take 1 tablet by mouth 3 (three) times daily as needed for moderate pain. ) 90 tablet 0  . lisinopril (PRINIVIL,ZESTRIL) 20 MG tablet Take 1 tablet (20 mg total) by mouth daily. 30 tablet 1  . loratadine (CLARITIN) 10 MG tablet Take 10 mg by mouth daily.      . metoprolol succinate (TOPROL-XL) 25 MG 24 hr tablet Take 25 mg by mouth daily.      Marland Kitchen amLODipine (NORVASC) 10 MG tablet Take 1 tablet (10 mg total) by mouth daily. (Patient not taking: Reported on 10/01/2019) 30 tablet 1  . clonazePAM  (KLONOPIN) 1 MG tablet Take 1 tablet (1 mg total) by mouth 3 (three) times daily as needed for anxiety. (Patient not taking: Reported on 10/01/2019) 90 tablet 0  . ibuprofen (ADVIL,MOTRIN) 800 MG tablet Take 1 tablet (800 mg total) by mouth every 8 (eight) hours as needed. (Patient not taking: Reported on 10/01/2019) 30 tablet 0      Home: Home Living Family/patient expects to be discharged to:: Private residence Living Arrangements: Alone Available Help at Discharge: Family, Available PRN/intermittently(daughters, sister) Type of Home: Apartment Home Access: Level entry Home Layout: One level Bathroom Shower/Tub: Tub/shower unit  Lives With: Alone  Functional History: Prior Function Level of Independence: Independent Comments: Works as Environmental consultant Status:  Mobility: Bed Mobility Overal bed mobility: Needs Assistance Bed Mobility: Sit to Supine, Supine to Sit Supine to sit: Min guard Sit to supine: Min guard Transfers Overall transfer level: Needs assistance Equipment used: None Transfers: Sit to/from Stand, [employer] Transfers Sit to Stand: Min assist, +2 safety/equipment Stand pivot transfers: Mod assist, +2 safety/equipment General transfer comment: MinA to rise from [facility], modA to pivot towards left. Modest instability and increased trunk flexion Ambulation/Gait General Gait Details: deferred by pt   ADL:   Cognition: Cognition Overall Cognitive Status: Impaired/Different from baseline Arousal/Alertness: Awake/alert Orientation Level: Oriented X4 Attention: Sustained Sustained Attention: Impaired Sustained Attention Impairment: Verbal complex, Functional basic Memory: Impaired Memory Impairment: Decreased short term memory Decreased Short Term Memory: Verbal basic Awareness: Impaired Executive Function: Self Monitoring Self Monitoring: Impaired Behaviors: Impulsive Cognition Arousal/Alertness: Awake/alert Behavior During Therapy: Restless,  Impulsive Overall Cognitive Status: Impaired/Different from baseline Area of Impairment: Attention, Memory Current Attention Level: Sustained Memory: Decreased short-term memory General Comments: Pt impulsive, extremely distractable, demonstrating almost manic behaviors. Needs frequent cues for attending to task and for safety. Also potential STM deficits as she asks multiple times if I was a PT or OT but then that could be a component of her decreased attention span as well.     Blood pressure (!) 177/114, pulse 69, temperature 98 F (36.7 C), temperature source Oral, resp. rate 16, height 5\' 7"  (1.702 m), weight 83.5 kg, SpO2 98 %.   Physical Exam  Nursing note and vitals reviewed. General: Alert and oriented x 3, No apparent distress HEENT: Head is normocephalic, atraumatic, PERRLA, EOMI, sclera anicteric, oral mucosa pink and moist, dentition intact, ext ear canals clear,  Neck: Supple without JVD or lymphadenopathy Heart: Reg rate and rhythm. No murmurs rubs or gallops Chest: CTA bilaterally without wheezes, rales, or rhonchi; no distress Abdomen: Soft, non-tender, non-distended, bowel sounds positive. Extremities: No clubbing, cyanosis, or edema. Pulses are 2+ Skin: Clean and intact without signs of breakdown Neuro: Pt is cognitively appropriate with normal insight and awareness. Impaired immediate and delayed recall. Cranial nerves 2-12 are intact. Sensory exam is normal. Reflexes are 2+  in all 4's.  No tremors. Motor function is grossly 5/5.  MSK: Tenderness to palpation in bilateral knees.  Psych: Pt's affect is appropriate. Pt is cooperative   Lab Results Last 24 Hours       Results for orders placed or performed during the hospital encounter of 09/30/19 (from the past 24 hour(s))  Hemoglobin A1c     Status: Abnormal    Collection Time: 10/02/19  2:58 PM  Result Value Ref Range    Hgb A1c MFr Bld 6.2 (H) 4.8 - 5.6 %    Mean Plasma Glucose 131.24 mg/dL       Imaging  Results (Last 48 hours)  ECHOCARDIOGRAM COMPLETE   Result Date: 10/02/2019    ECHOCARDIOGRAM REPORT   Patient Name:   [facility] Date of Exam: 10/02/2019 Medical Rec #:  [id number]            Height:       67.0 in Accession #:    [id number]           Weight:       184.1 lb Date of Birth:  1951/06/17           BSA:          1.952 m Patient Age:    6 years             BP:           139/86 mmHg Patient Gender: F                    HR:           70 bpm. Exam Location:  Inpatient Procedure: 2D Echo Indications:    stroke 434.91  History:        Patient has no prior history of Echocardiogram examinations.                 Risk Factors:Hypertension.  Sonographer:    Johny Chess Referring Phys: [place]  1. Left ventricular ejection fraction, by estimation, is 55 to 60%. The left ventricle has normal function. The left ventricle has no regional wall motion abnormalities. The left ventricular internal cavity size was severely dilated. Left ventricular diastolic parameters are consistent with Grade I diastolic dysfunction (impaired relaxation). Elevated left ventricular end-diastolic pressure.  2. Right ventricular systolic function is normal. The right ventricular size is normal. There is normal pulmonary artery systolic pressure.  3. The mitral valve is normal in structure and function. Trivial mitral valve regurgitation. No evidence of mitral stenosis.  4. The aortic valve is tricuspid. Aortic valve regurgitation is trivial. Mild to moderate aortic valve sclerosis/calcification is present, without any evidence of aortic stenosis.  5. Aortic dilatation noted. 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  6. The inferior vena cava is normal in size with greater than 50% respiratory variability, suggesting right atrial pressure of 3 mmHg. FINDINGS  Left Ventricle: Left ventricular ejection fraction, by estimation, is 55 to 60%. The left ventricle has normal  function. The left ventricle has no regional wall motion abnormalities. The left ventricular internal cavity size was severely dilated. There is no left ventricular hypertrophy. Left ventricular diastolic parameters are consistent with Grade I diastolic dysfunction (impaired relaxation). Elevated left ventricular end-diastolic pressure. Right Ventricle: The right ventricular size is normal. No increase in right ventricular wall thickness. Right ventricular systolic function is normal. There is normal pulmonary artery systolic pressure. The tricuspid regurgitant velocity  is 2.47 m/s, and  with an assumed right atrial pressure of 3 mmHg, the estimated right ventricular systolic pressure is [id number]. Left Atrium: Left atrial size was normal in size. Right Atrium: Right atrial size was normal in size. Pericardium: There is no evidence of pericardial effusion. Mitral Valve: The mitral valve is normal in structure and function. Normal mobility of the mitral valve leaflets. Trivial mitral valve regurgitation. No evidence of mitral valve stenosis. Tricuspid Valve: The tricuspid valve is normal in structure. Tricuspid valve regurgitation is trivial. No evidence of tricuspid stenosis. Aortic Valve: The aortic valve is tricuspid. . There is moderate thickening and moderate calcification of the aortic valve. Aortic valve regurgitation is trivial. Mild to moderate aortic valve sclerosis/calcification is present, without any evidence of aortic stenosis. There is moderate thickening of the aortic valve. There is moderate calcification of the aortic valve. Pulmonic Valve: The pulmonic valve was normal in structure. Pulmonic valve regurgitation is not visualized. No evidence of pulmonic stenosis. Aorta: The aortic root is normal in size and structure and aortic dilatation noted. 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 Venous: The inferior vena cava is normal in size with greater than 50% respiratory  variability, suggesting right atrial pressure of 3 mmHg. IAS/Shunts: The interatrial septum appears to be lipomatous. No atrial level shunt detected by color flow Doppler.  LEFT VENTRICLE PLAX 2D LVIDd:         5.70 cm  Diastology LVIDs:         4.20 cm  LV e' lateral:   7.83 cm/s LV PW:         1.30 cm  LV E/e' lateral: 11.6 LV IVS:        1.10 cm  LV e' medial:    5.11 cm/s LVOT diam:     2.10 cm  LV E/e' medial:  17.8 LV SV:         65 LV SV Index:   33 LVOT Area:     3.46 cm  RIGHT VENTRICLE RV S prime:     11.60 cm/s TAPSE (M-mode): 1.7 cm LEFT ATRIUM             Index       RIGHT ATRIUM           Index LA diam:        4.60 cm 2.36 cm/m  RA Area:     15.50 cm LA Vol (A2C):   49.5 ml 25.36 ml/m RA Volume:   36.90 ml  18.90 ml/m LA Vol (A4C):   44.1 ml 22.59 ml/m LA Biplane Vol: 49.4 ml 25.30 ml/m  AORTIC VALVE LVOT Vmax:   86.80 cm/s LVOT Vmean:  54.700 cm/s LVOT VTI:    0.187 m  AORTA Ao Root diam: 3.00 cm Ao Asc diam:  4.30 cm MITRAL VALVE               TRICUSPID VALVE MV Area (PHT): 4.39 cm    TR Peak grad:   24.4 mmHg MV Decel Time: 173 msec    TR Vmax:        247.00 cm/s MV E velocity: 90.80 cm/s MV A velocity: 99.40 cm/s  SHUNTS MV E/A ratio:  0.91        Systemic VTI:  0.19 m                            Systemic Diam: 2.10 cm  Fransico Him MD Electronically signed by Fransico Him MD Signature Date/Time: 10/02/2019/11:16:07 AM    Final     VAS US CAROTID   Result Date: 10/02/2019 Carotid Arterial Duplex Study Indications:       CVA. Risk Factors:      Hypertension. Comparison Study:  no prior Performing Technologist: Abram Sander RVS  Examination Guidelines: A complete evaluation includes B-mode imaging, spectral Doppler, color Doppler, and power Doppler as needed of all accessible portions of each vessel. Bilateral testing is considered an integral part of a complete examination. Limited examinations for reoccurring indications may be performed as noted.  Right Carotid Findings:  +----------+--------+--------+--------+------------------+--------+           PSV cm/sEDV cm/sStenosisPlaque DescriptionComments +----------+--------+--------+--------+------------------+--------+ CCA Prox  68      16              heterogenous               +----------+--------+--------+--------+------------------+--------+ CCA Distal50      17              heterogenous               +----------+--------+--------+--------+------------------+--------+ ICA Prox  40      20      1-39%   heterogenous               +----------+--------+--------+--------+------------------+--------+ ICA Distal26      10                                         +----------+--------+--------+--------+------------------+--------+ ECA       73      11                                         +----------+--------+--------+--------+------------------+--------+ +----------+--------+-------+--------+-------------------+           PSV cm/sEDV cmsDescribeArm Pressure (mmHG) +----------+--------+-------+--------+-------------------+ [id number]                                         +----------+--------+-------+--------+-------------------+ +---------+--------+--+--------+--+---------+ VertebralPSV cm/s39EDV cm/s12Antegrade +---------+--------+--+--------+--+---------+  Left Carotid Findings: +----------+--------+--------+--------+------------------+--------+           PSV cm/sEDV cm/sStenosisPlaque DescriptionComments +----------+--------+--------+--------+------------------+--------+ CCA Prox  60      19              heterogenous               +----------+--------+--------+--------+------------------+--------+ CCA Distal45      16              heterogenous               +----------+--------+--------+--------+------------------+--------+ ICA Prox  43      20      1-39%   heterogenous                +----------+--------+--------+--------+------------------+--------+ ICA Distal38      17                                         +----------+--------+--------+--------+------------------+--------+ ECA       48      10                                         +----------+--------+--------+--------+------------------+--------+ +----------+--------+--------+--------+-------------------+  PSV cm/sEDV cm/sDescribeArm Pressure (mmHG) +----------+--------+--------+--------+-------------------+ [id number]                                          +----------+--------+--------+--------+-------------------+ +---------+--------+--+--------+-+---------+ VertebralPSV cm/s29EDV cm/s8Antegrade +---------+--------+--+--------+-+---------+   Summary: Right Carotid: Velocities in the right ICA are consistent with a 1-39% stenosis. Left Carotid: Velocities in the left ICA are consistent with a 1-39% stenosis. Vertebrals: Bilateral vertebral arteries demonstrate antegrade flow. *See table(s) above for measurements and observations.     Preliminary        Assessment/Plan: Diagnosis: Impaired mobility and ADLs secondary to multiple microhemorrhages 1. Does the need for close, 24 hr/day medical supervision in concert with the patient's rehab needs make it unreasonable for this patient to be served in a less intensive setting? Yes 2. Co-Morbidities requiring supervision/potential complications: essnetial HTN, bilateral primary OA of knees 3. Due to bladder management, bowel management, safety, skin/wound care, disease management, medication administration, pain management and patient education, does the patient require 24 hr/day rehab nursing? Yes 4. Does the patient require coordinated care of a physician, rehab nurse, therapy disciplines of PT, OT, SLP to address physical and functional deficits in the context of the above medical diagnosis(es)? Yes Addressing deficits in the following  areas: balance, endurance, locomotion, strength, transferring, bowel/bladder control, bathing, dressing, feeding, grooming, toileting, cognition and psychosocial support 5. Can the patient actively participate in an intensive therapy program of at least 3 hrs of therapy per day at least 5 days per week? Yes 6. The potential for patient to make measurable gains while on inpatient rehab is excellent 7. Anticipated functional outcomes upon discharge from inpatient rehab are modified independent  with PT, modified independent with OT, modified independent with SLP. 8. Estimated rehab length of stay to reach the above functional goals is: 10-14 days 9. Anticipated discharge destination: Home 10. Overall Rehab/Functional Prognosis: excellent   RECOMMENDATIONS: This patient's condition is appropriate for continued rehabilitative care in the following setting: CIR Patient has agreed to participate in recommended program. Yes Note that insurance prior authorization may be required for reimbursement for recommended care.   Comment: Mrs. Tobin would be an excellent CIR candidate. She has no family support and goals would be ModI. Currently ambulating 120 feet ModA with unsteadiness, dizziness, and impulsivity. Very motivated and cognition mostly intact with the exception of impaired memory. Currently requiring IV Zofran.   Consider scopolamine patch for better control of nausea and dizziness.    Thank you for this consult.    Bary Leriche, PA-C 10/03/2019    I have personally performed a face to face diagnostic evaluation, including, but not limited to relevant history and physical exam findings, of this patient and developed relevant assessment and plan.  Additionally, I have reviewed and concur with the physician assistant's documentation above.   Leeroy Cha, MD

## 2019-10-06 NOTE — H&P (Signed)
Physical Medicine and Rehabilitation Admission H&P    Chief Complaint  Patient presents with  . Functional deficits due to stroke  . Vestibular symptoms.     HPI:  Alison Whitehead is a 69 year old female with history of HTN--no meds X months, OA bilateral knees, cocaine abuse who was admitted on 10/01/19 with onset of weakness and dizziness followed by nausea and vomiting. UDS positive for opiates and cocaine. BP markedly elevated at 183/120.  MRI of brain done revealing acute/early subacute microhemorrhage with hemorrhagic lacunar infarct in right pons, advanced chronic small vessel disease and multiple supratentorial chronic microhemorrhages question due to sequela of hypertensive microangiopathy.  CT abdomen pelvis showed enlarged fibroid uterus with fairly extensive diverticulosis.  2D echo showed EF of 55 to 60% with severe dilatation of left ventricle and mild dilatation of ascending aorta-4.3 cm.  Carotid Dopplers were negative for significant ICA stenosis.  Stroke was felt to be secondary to small vessel disease and low-dose aspirin recommended for secondary stroke prevention.  BP medications have been titrated for better blood pressure control.  She continues to have limitations due to headaches, nausea with vestibular symptoms, poor safety awareness, deficits in problem solving as well as OA bilateral knees.  CIR was recommended due to functional decline.  Pt reports mild L facial pain- doesn't remember that had it before, but when asked, thinks it's due to her jaw, not due to nerve pain. Is common to have thalamic pain after CVAs- will need to be monitored.   LBM yesterday.    Review of Systems  Constitutional: Negative for chills and fever.  HENT: Negative for hearing loss and tinnitus.   Eyes: Negative for blurred vision and double vision.  Respiratory: Negative for cough and hemoptysis.   Cardiovascular: Negative for chest pain and palpitations.  Gastrointestinal:  Positive for nausea. Negative for abdominal pain and diarrhea.  Genitourinary: Negative for dysuria and urgency.  Musculoskeletal: Positive for joint pain.  Skin: Negative for rash.  Neurological: Positive for dizziness and headaches.  Psychiatric/Behavioral: The patient does not have insomnia.   All other systems reviewed and are negative.  Past Medical History:  Diagnosis Date  . Arthritis   . DDD (degenerative disc disease)   . Degenerative disc disease, lumbar   . Hypertension   . IBS (irritable bowel syndrome)   . Osteopenia    Past Surgical History:  Procedure Laterality Date  . SHOULDER SURGERY     Family History  Problem Relation Age of Onset  . Breast cancer Maternal Aunt   . Diabetes Mother   . Hypertension Mother   . Glaucoma Mother   . Diabetes Sister   . Hypertension Sister   . Cancer Sister    Social History:  reports that she has been smoking. She has never used smokeless tobacco. She reports that she does not drink alcohol or use drugs. she CANNOT remember how much she smokes- said she quit, but said right before came in hospital- which sounds like hadn't quit- knew she didn't smoke 2ppd, but didn't know how much overall. Says lives in [place] in 1 story house with no STE?  Allergies:  Allergies  Allergen Reactions  . Penicillins Itching   Medications Prior to Admission  Medication Sig Dispense Refill  . amlodipine-benazepril (LOTREL) 2.5-10 MG capsule Take 1 capsule by mouth daily.    Marland Kitchen aspirin EC 81 MG tablet Take 1 tablet (81 mg total) by mouth daily.    Marland Kitchen atorvastatin (LIPITOR) 40  MG tablet Take 1 tablet (40 mg total) by mouth daily at 6 PM.    . carvedilol (COREG) 6.25 MG tablet Take 1 tablet (6.25 mg total) by mouth 2 (two) times daily with a meal.    . diclofenac sodium (VOLTAREN) 1 % GEL Apply 2-4 g topically 4 (four) times daily. (Patient taking differently: Apply 2-4 g topically 4 (four) times daily as needed (pain). ) 500 g 1  . [START ON  10/07/2019] hydrochlorothiazide (MICROZIDE) 12.5 MG capsule Take 1 capsule (12.5 mg total) by mouth daily.    Marland Kitchen HYDROcodone-acetaminophen (NORCO) 10-325 MG tablet Take 1 tablet by mouth 3 (three) times daily as needed. (Patient taking differently: Take 1 tablet by mouth 3 (three) times daily as needed for moderate pain. ) 90 tablet 0  . loratadine (CLARITIN) 10 MG tablet Take 10 mg by mouth daily.    . meclizine (ANTIVERT) 12.5 MG tablet Take 1 tablet (12.5 mg total) by mouth 2 (two) times daily as needed for dizziness. 30 tablet 0    Drug Regimen Review  Drug regimen was reviewed and remains appropriate with no significant issues identified  Home:     Functional History:    Functional Status:  Mobility:          ADL:    Cognition:       Height 5\' 7"  (1.702 m), weight 84 kg. Physical Exam  Nursing note and vitals reviewed. Constitutional: She appears well-developed and well-nourished.  Sleepy--reports headaches.  Pt c/o L facial pain when asked; sitting up in bed; staring at nothing- TV not on; NAD  HENT:  Head: Normocephalic and atraumatic.  Wearing eyeglasses,  No facial droop; denies facial sensation changes Kept rubbing L face due to pain Tongue midline and coated  Eyes: Pupils are equal, round, and reactive to light. Conjunctivae are normal.  R eye didn't go quite all the way to R when testing EOMs- however L side would go fully lateral- also fully up and down; no nystagmus  Neck: No tracheal deviation present.  Cardiovascular:  RRR- no M/R/G  Respiratory: No stridor. No respiratory distress. She has no wheezes.  Little coarse? She kept making a noise with expiration so hard to hear. Otherwise, good air movement; no accessory muscle use; no O2  GI: She exhibits no distension. There is no abdominal tenderness.  Soft, NT, ND; (+)BS  Musculoskeletal:        General: No deformity or edema.     Cervical back: Normal range of motion and neck supple.     Comments:  RUE and LUE 5-/5 in deltoid, biceps, triceps, WE,  grip and finger abd- effort was slow  RLE and LLE_ 5-/5 in HF, KE, KF, DF and PF Effort slow  Neurological: She is alert.  Pt confused, not oriented when I examined her- didn't remember anything about her care/medical info  LUE biceps DTRs 2+; otherwise absent in RUE and LEs B/L No clonus, no hoffman's, no increase tone all extremities Sensation intact to light touch in all 4 extremities  Skin: Skin is warm and dry.  IV in R forearm- no infiltrates No signs of skin breakdown seen  Psychiatric:  Extremely flat- kept repeating I don't remember.     Results for orders placed or performed during the hospital encounter of 09/30/19 (from the past 48 hour(s))  Basic metabolic panel     Status: Abnormal   Collection Time: 10/05/19  6:48 AM  Result Value Ref Range   Sodium 135  135 - 145 mmol/L   Potassium 4.3 3.5 - 5.1 mmol/L   Chloride 96 (L) 98 - 111 mmol/L   CO2 30 22 - 32 mmol/L   Glucose, Bld 97 70 - 99 mg/dL    Comment: Glucose reference range applies only to samples taken after fasting for at least 8 hours.   BUN 16 8 - 23 mg/dL   Creatinine, Ser 0.98 0.44 - 1.00 mg/dL   Calcium 9.0 8.9 - 10.3 mg/dL   GFR calc non Af Amer 60 (L) >60 mL/min   GFR calc Af Amer >60 >60 mL/min   Anion gap 9 5 - 15    Comment: Performed at [facility] [address], [place], [place] [id number]  Basic metabolic panel     Status: Abnormal   Collection Time: 10/06/19  2:39 AM  Result Value Ref Range   Sodium 136 135 - 145 mmol/L   Potassium 4.8 3.5 - 5.1 mmol/L   Chloride 99 98 - 111 mmol/L   CO2 23 22 - 32 mmol/L   Glucose, Bld 111 (H) 70 - 99 mg/dL    Comment: Glucose reference range applies only to samples taken after fasting for at least 8 hours.   BUN 24 (H) 8 - 23 mg/dL   Creatinine, Ser 1.21 (H) 0.44 - 1.00 mg/dL   Calcium 8.8 (L) 8.9 - 10.3 mg/dL   GFR calc non Af Amer 46 (L) >60 mL/min   GFR calc Af Amer 54 (L) >60 mL/min    Anion gap 14 5 - 15    Comment: Performed at [facility] [address] [place], [place] [address]   No results found.     Medical Problem List and Plan: 1.  Impaired Function, ADLs and mobility secondary to posterior pons/B/L basal ganglia/thalamus infarcts  -patient may  shower  -ELOS/Goals: 10-14 days/ goals Supervision to Mod I 2.  Antithrombotics: -DVT/anticoagulation:  Pharmaceutical: Lovenox  -antiplatelet therapy: ASA 3. Persistent HA/Pain Management: Will change oxycodone to ultram prn. Add topamax to treat HA.  -monitor L facial pain- could be thalamic pain vs jaw pain- pt unable to localize better.   4. Mood: LCSW to follow for evaluation and support.   -antipsychotic agents: N/A 5. Neuropsych: This patient is not capable of making decisions on her own behalf. 6. Skin/Wound Care: Routine pressure relief measures.  7. Fluids/Electrolytes/Nutrition: Monitor I/O. Check lytes in am. 8. HTN: Monitor BP tid--continue Norvasc, HCTZ, coreg bid, Bidil, lisinopril, Zestril. Has not taken medications for months. Will need to monitor for hypotension as medications get to steady state.  -need to keep BP <140/80 per Neurology  9. OA bilateral knees: Will add Voltaren gel to knees qid. Last used hydrocodone 11/20. Avoid narcotics due to ongoing polysubstance abuse.  10. AKI: Likely due to HCTZ and lisinopril. Will monitor lytes with serial checks. Recheck in am 11. Prediabetes: Hgb A1c- 6.2. Will monitor BS ac/hs and have RD educate on CM diet.   Reesa Chew, P.A.    Courtney Heys, MD 10/06/2019

## 2019-10-06 NOTE — Progress Notes (Signed)
SLP Cancellation Note  Patient Details Name: Hanifah Schirra MRN: [id number] DOB: [birth year]   Cancelled treatment:       Reason Eval/Treat Not Completed: Other (comment) Tx session attempted. Patient stated she was very tired and would like to sleep; Pt requested ST return later in day. ST will re-attempt as schedule allows.  Pamalee Marcoe 10/06/2019, 10:57am  Marina Goodell, M.Ed., CCC-SLP Speech Therapy Acute Rehabilitation [phone]: Acute Rehab office [phone] - pager

## 2019-10-06 NOTE — PMR Pre-admission (Signed)
PMR Admission Coordinator Pre-Admission Assessment  Patient: Alison Whitehead is an 69 y.o., female MRN: [id number] DOB: 1951/01/27 Height: 5\' 7"  (170.2 cm) Weight: 83.5 kg              Insurance Information HMO:     PPO: yes     PCP:      IPA:      80/20:      OTHER:  PRIMARY: [employer] Medicare      Policy#: [id number]      Subscriber: pt CM NameFara Chute      Phone#: [phone]     Fax#: [phone] Pre-Cert#: [id number] auth for admission provided by [facility] with [facility]. Updates due to [employer] at fax listed above on 3/9      Employer: n/a Benefits:  Phone #: [phone]     Name: n/a Eff. Date: 08/05/19     Deduct: $0      Out of Pocket Max: $5000      Life Max: n/a CIR: $295/day for 6 days      SNF: 20 full days Outpatient:      Co-Pay: $35 Home Health: 100%      Co-Pay:  DME: 80%     Co-Pay: 20% Providers: in network SECONDARY:       Policy#:       Subscriber:  CM Name:       Phone#:      Fax#:  Pre-Cert#:       Employer:  Benefits:  Phone #:      Name:  Eff. Date:      Deduct:       Out of Pocket Max:       Life Max:  CIR:       SNF:  Outpatient:      Co-Pay:  Home Health:       Co-Pay:  DME:      Co-Pay:   Medicaid Application Date:       Case Manager:  Disability Application Date:       Case Worker:   The "Data Collection Information Summary" for patients in Inpatient Rehabilitation Facilities with attached "Privacy Act [place] Records" was provided and verbally reviewed with: Patient  Emergency Contact Information Contact Information    Name Relation Home Work Mobile   [place] Sister [phone]     [place],Carolyn Daughter [phone]       Current Medical History  Patient Admitting Diagnosis: pontine CVA  History of Present Illness: Pt is a 69 y/o female with PMH of HTN, DJD, IBS, and polysubstance abuse, admitted to [facility] on 09/30/2019 with dizziness and nausea/vomitting.  MRI showed acute/subacute early  microhemorrhage or hemorrhagic lacunar infarct in the posterior pons, chronic small vessel ischemia and multiple lacunar infarcts in the basal ganglia (bilat) and thalamus.  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  Therapy evaluations were completed and pt was recommended for CIR.   Complete NIHSS TOTAL: 4 Glasgow Coma Scale Score: 15  Past Medical History  Past Medical History:  Diagnosis Date  . Arthritis   . DDD (degenerative disc disease)   . Degenerative disc disease, lumbar   . Hypertension   . IBS (irritable bowel syndrome)   . Osteopenia     Family History  family history includes Breast cancer in her maternal aunt; Cancer in her sister; Diabetes in her mother and sister; Glaucoma in her mother; Hypertension in her mother and sister.  Prior Rehab/Hospitalizations:  Has the patient had prior rehab or hospitalizations prior to admission? No  Has the patient had major surgery during 100 days prior to admission? No  Current Medications   Current Facility-Administered Medications:  .  0.45 % sodium chloride infusion, , Intravenous, Continuous, Charlynne Cousins, MD, Last Rate: 10 mL/hr at 10/01/19 1041, Rate Change at 10/01/19 1041 .  acetaminophen (TYLENOL) tablet 650 mg, 650 mg, Oral, Q6H PRN, 650 mg at 10/05/19 0241 **OR** acetaminophen (TYLENOL) suppository 650 mg, 650 mg, Rectal, Q6H PRN, Jonelle Sidle, Mohammad L, MD .  amLODipine (NORVASC) tablet 10 mg, 10 mg, Oral, Daily, Jonelle Sidle, Mohammad L, MD, 10 mg at 10/06/19 0919 .  aspirin tablet 325 mg, 325 mg, Oral, Daily, Sarina Ill B, MD, 325 mg at 10/06/19 0920 .  atorvastatin (LIPITOR) tablet 40 mg, 40 mg, Oral, q1800, Jonelle Sidle, Mohammad L, MD, 40 mg at 10/05/19 1800 .  carvedilol (COREG) tablet 6.25 mg, 6.25 mg, Oral, BID WC, Charlynne Cousins, MD, 6.25 mg at 10/06/19 0920 .  clonazePAM (KLONOPIN) tablet 1 mg, 1 mg, Oral, TID PRN, Jonelle Sidle, Mohammad L,  MD .  feeding supplement (ENSURE ENLIVE) (ENSURE ENLIVE) liquid 237 mL, 237 mL, Oral, BID BM, Charlynne Cousins, MD, 237 mL at 10/06/19 0920 .  hydrALAZINE (APRESOLINE) tablet 10 mg, 10 mg, Oral, Q6H PRN, Elwyn Reach, MD, 10 mg at 10/02/19 [id number] .  hydrochlorothiazide (MICROZIDE) capsule 12.5 mg, 12.5 mg, Oral, Daily, Charlynne Cousins, MD, 12.5 mg at 10/06/19 0919 .  HYDROcodone-acetaminophen (NORCO) 10-325 MG per tablet 1 tablet, 1 tablet, Oral, Q4H PRN, Charlynne Cousins, MD, 1 tablet at 10/05/19 1455 .  isosorbide-hydrALAZINE (BIDIL) 20-37.5 MG per tablet 1 tablet, 1 tablet, Oral, TID, Charlynne Cousins, MD, 1 tablet at 10/06/19 0919 .  lisinopril (ZESTRIL) tablet 20 mg, 20 mg, Oral, Daily, Jonelle Sidle, Mohammad L, MD, 20 mg at 10/06/19 0920 .  loratadine (CLARITIN) tablet 10 mg, 10 mg, Oral, Daily, Charlynne Cousins, MD, 10 mg at 10/06/19 0920 .  meclizine (ANTIVERT) tablet 12.5 mg, 12.5 mg, Oral, BID PRN, Charlynne Cousins, MD, 12.5 mg at 10/05/19 1455 .  multivitamin with minerals tablet 1 tablet, 1 tablet, Oral, Daily, Charlynne Cousins, MD, 1 tablet at 10/06/19 0920 .  ondansetron (ZOFRAN) tablet 4 mg, 4 mg, Oral, Q6H PRN, 4 mg at 10/05/19 1455 **OR** ondansetron (ZOFRAN) injection 4 mg, 4 mg, Intravenous, Q6H PRN, Elwyn Reach, MD, 4 mg at 10/05/19 0640 .  polyethylene glycol (MIRALAX / GLYCOLAX) packet 17 g, 17 g, Oral, Daily, Charlynne Cousins, MD, 17 g at 10/06/19 0920  Patients Current Diet:  Diet Order            Diet - low sodium heart healthy        Diet Heart Room service appropriate? Yes; Fluid consistency: Thin  Diet effective now              Precautions / Restrictions Precautions Precautions: Fall Restrictions Weight Bearing Restrictions: No   Has the patient had 2 or more falls or a fall with injury in the past year?No  Prior Activity Level Community (5-7x/wk): no DME prior to admission, working as a caregiver a few hours a  week  Prior Functional Level Prior Function Level of Independence: Independent Comments: Works as caregiver  Self Care: Did the patient need help bathing, dressing, using the toilet or eating?  Independent  Indoor Mobility: Did the patient need assistance with walking from room to room (with or  without device)? Independent  Stairs: Did the patient need assistance with internal or external stairs (with or without device)? Independent  Functional Cognition: Did the patient need help planning regular tasks such as shopping or remembering to take medications? Independent  Home Assistive Devices / Equipment Home Assistive Devices/Equipment: None  Prior Device Use: Indicate devices/aids used by the patient prior to current illness, exacerbation or injury? None of the above  Current Functional Level Cognition  Arousal/Alertness: Awake/alert Overall Cognitive Status: (did not formally assess as patient was very sleepy,  She did wake for exercises but refused OOB this session.) Current Attention Level: Sustained Orientation Level: Oriented X4 Following Commands: Follows one step commands consistently, Follows one step commands with increased time, Follows multi-step commands with increased time Safety/Judgement: Decreased awareness of safety, Decreased awareness of deficits General Comments: patient with requiring cueing for safety awareness and problem solving, decreased attention and requires redirection; not impulsive during OT session today Attention: Sustained Sustained Attention: Impaired Sustained Attention Impairment: Verbal complex, Functional basic Memory: Impaired Memory Impairment: Decreased short term memory Decreased Short Term Memory: Verbal basic Awareness: Impaired Executive Function: Self Monitoring Self Monitoring: Impaired Behaviors: Impulsive    Extremity Assessment (includes Sensation/Coordination)  Upper Extremity Assessment: Overall WFL for tasks  assessed RUE Deficits / Details: Strength 5/5 LUE Deficits / Details: Strength 5/5  Lower Extremity Assessment: Defer to PT evaluation RLE Deficits / Details: Strength 5/5 LLE Deficits / Details: Strength 5/5    ADLs  Overall ADL's : Needs assistance/impaired Eating/Feeding: Set up, Sitting Grooming: Min guard, Sitting, Standing, Wash/dry hands, Wash/dry face Grooming Details (indicate cue type and reason): min guard for safety and balance if standing; supervision sitting Upper Body Bathing: Min guard, Sitting Lower Body Bathing: Minimal assistance, Sit to/from stand Lower Body Bathing Details (indicate cue type and reason): for balance Upper Body Dressing : Min guard, Sitting Lower Body Dressing: Minimal assistance, Sit to/from stand Toilet Transfer: Minimal assistance, Ambulation, RW Toilet Transfer Details (indicate cue type and reason): simulated in room Toileting- Clothing Manipulation and Hygiene: Minimal assistance, Sit to/from stand Toileting - Clothing Manipulation Details (indicate cue type and reason): Min A for balance and gown management Functional mobility during ADLs: Minimal assistance, Rolling walker General ADL Comments: limited by dizziness, educated on visual focal point but poor attention and safety limiting ADL independence    Mobility  Overal bed mobility: Needs Assistance Bed Mobility: Supine to Sit, Sit to Supine Supine to sit: Min guard Sit to supine: Min guard General bed mobility comments: cues to boost to [facility].    Transfers  Overall transfer level: Needs assistance Equipment used: Rolling walker (2 wheeled) Transfers: Sit to/from Stand Sit to Stand: Min assist Stand pivot transfers: Mod assist, +2 safety/equipment General transfer comment: cueing for hand placement and technique     Ambulation / Gait / Stairs / Wheelchair Mobility  Ambulation/Gait Ambulation/Gait assistance: Herbalist (Feet): 10 Feet(x2) Assistive device: Rolling  walker (2 wheeled) Gait Pattern/deviations: Step-through pattern, Staggering right, Ataxic, Trunk flexed, Scissoring, Narrow base of support General Gait Details: Pt continues to push device too far forward and remains with difficulty following commands to stay safe in device.    Posture / Balance Balance Overall balance assessment: Needs assistance Sitting-balance support: No upper extremity supported, Feet supported Sitting balance-Leahy Scale: Fair Standing balance support: Bilateral upper extremity supported, During functional activity Standing balance-Leahy Scale: Poor Standing balance comment: relaint on BUE and external support    Special needs/care consideration BiPAP/CPAP no CPMno Continuous  Drip IV  Dialysis no        Days n/a Life Vest no Oxygen no Special Bed no Trach Size no Wound Vac (area) no      Location n/a Skin intact                              Location Bowel mgmt: continent Bladder mgmt: continent Diabetic mgmt no Behavioral consideration no Chemo/radiation no     Previous Home Environment (from acute therapy documentation) Living Arrangements: Alone  Lives With: Alone Available Help at Discharge: Family, Available PRN/intermittently(daughters, sister) Type of Home: Apartment Home Layout: One level Home Access: Level entry Bathroom Shower/Tub: Chiropodist: Standard Home Care Services: No  Discharge Living Setting Plans for Discharge Living Setting: Patient's home Type of Home at Discharge: Apartment Discharge Home Layout: One level Discharge Home Access: Level entry Discharge Bathroom Shower/Tub: Tub/shower unit Discharge Bathroom Toilet: Standard Discharge Bathroom Accessibility: Yes How Accessible: Accessible via walker Does the patient have any problems obtaining your medications?: No  Social/Family/Support Systems Anticipated Caregiver: mod I goals per Dr. Ranell Patrick Ability/Limitations of Caregiver: daughter, Hoyle Sauer  [phone], works days Caregiver Availability: Intermittent Discharge Plan Discussed with Primary Caregiver: Yes Is Caregiver In Agreement with Plan?: Yes Does Caregiver/Family have Issues with Lodging/Transportation while Pt is in Rehab?: No   Goals/Additional Needs Patient/Family Goal for Rehab: PT/OT/SLP mod I Expected length of stay: 9-12 days Dietary Needs: heart healthy/thin Pt/Family Agrees to Admission and willing to participate: Yes Program Orientation Provided & Reviewed with Pt/Caregiver Including Roles  & Responsibilities: Yes  Barriers to Discharge: Insurance for SNF coverage   Decrease burden of Care through IP rehab admission: n/a   Possible need for SNF placement upon discharge: Not anticipated.  Pt with mod I goals.    Patient Condition: This patient's medical and functional status has changed since the consult dated: 10/03/2019 in which the Rehabilitation Physician determined and documented that the patient's condition is appropriate for intensive rehabilitative care in an inpatient rehabilitation facility. See "History of Present Illness" (above) for medical update. Functional changes are: min assist x10' . Patient's medical and functional status update has been discussed with the Rehabilitation physician and patient remains appropriate for inpatient rehabilitation. Will admit to inpatient rehab today.  Preadmission Screen Completed By:  Michel Santee, PT, DPT 10/06/2019 9:40 AM ______________________________________________________________________   Discussed status with Dr. Dagoberto Ligas on 10/06/19 at 9:46 AM  and received approval for admission today.  Admission Coordinator:  Michel Santee, PT, DPT time 9:48 AM Sudie Grumbling 10/06/19

## 2019-10-06 NOTE — Progress Notes (Signed)
PMR Admission Coordinator Pre-Admission Assessment   Patient: Alison Whitehead is an 69 y.o., female MRN: [id number] DOB: 05/14/1951 Height: 5\' 7"  (170.2 cm) Weight: 83.5 kg                                                                                                                                                  Insurance Information HMO:     PPO: yes     PCP:      IPA:      80/20:      OTHER:  PRIMARY: [employer]      Policy#: [id number]      Subscriber: pt CM NameFara Chute      Phone#: [phone]     Fax#: [phone] Pre-Cert#: [id number] auth for admission provided by [facility] with [facility]. Updates due to [place] at fax listed above on 3/9      Employer: n/a Benefits:  Phone #: [phone]     Name: n/a Eff. Date: 08/05/19     Deduct: $0      Out of Pocket Max: $5000      Life Max: n/a CIR: $295/day for 6 days      SNF: 20 full days Outpatient:      Co-Pay: $35 Home Health: 100%      Co-Pay:  DME: 80%     Co-Pay: 20% Providers: in network SECONDARY:       Policy#:       Subscriber:  CM Name:       Phone#:      Fax#:  Pre-Cert#:       Employer:  Benefits:  Phone #:      Name:  Eff. Date:      Deduct:       Out of Pocket Max:       Life Max:  CIR:       SNF:  Outpatient:      Co-Pay:  Home Health:       Co-Pay:  DME:      Co-Pay:    Medicaid Application Date:       Case Manager:  Disability Application Date:       Case Worker:    The "Data Collection Information Summary" for patients in Inpatient Rehabilitation Facilities with attached "Privacy Act [place] Records" was provided and verbally reviewed with: Patient   Emergency Contact Information         Contact Information     Name Relation Home Work Mobile    Krum Sister [phone]        Caldwell,Carolyn Daughter [phone]           Current Medical History  Patient Admitting Diagnosis: pontine CVA   History of Present Illness: Pt is a 69 y/o female with PMH of HTN,  DJD, IBS, and polysubstance abuse, admitted to [facility]  on 09/30/2019 with dizziness and nausea/vomitting.  MRI showed acute/subacute early microhemorrhage or hemorrhagic lacunar infarct in the posterior pons, chronic small vessel ischemia and multiple lacunar infarcts in the basal ganglia (bilat) and thalamus.  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  Therapy evaluations were completed and pt was recommended for CIR.    Complete NIHSS TOTAL: 4 Glasgow Coma Scale Score: 15   Past Medical History      Past Medical History:  Diagnosis Date  . Arthritis    . DDD (degenerative disc disease)    . Degenerative disc disease, lumbar    . Hypertension    . IBS (irritable bowel syndrome)    . Osteopenia        Family History  family history includes Breast cancer in her maternal aunt; Cancer in her sister; Diabetes in her mother and sister; Glaucoma in her mother; Hypertension in her mother and sister.   Prior Rehab/Hospitalizations:  Has the patient had prior rehab or hospitalizations prior to admission? No   Has the patient had major surgery during 100 days prior to admission? No   Current Medications    Current Facility-Administered Medications:  .  0.45 % sodium chloride infusion, , Intravenous, Continuous, Charlynne Cousins, MD, Last Rate: 10 mL/hr at 10/01/19 1041, Rate Change at 10/01/19 1041 .  acetaminophen (TYLENOL) tablet 650 mg, 650 mg, Oral, Q6H PRN, 650 mg at 10/05/19 0241 **OR** acetaminophen (TYLENOL) suppository 650 mg, 650 mg, Rectal, Q6H PRN, Jonelle Sidle, Mohammad L, MD .  amLODipine (NORVASC) tablet 10 mg, 10 mg, Oral, Daily, Jonelle Sidle, Mohammad L, MD, 10 mg at 10/06/19 0919 .  aspirin tablet 325 mg, 325 mg, Oral, Daily, Sarina Ill B, MD, 325 mg at 10/06/19 0920 .  atorvastatin (LIPITOR) tablet 40 mg, 40 mg, Oral, q1800, Jonelle Sidle, Mohammad L, MD, 40 mg at 10/05/19 1800 .  carvedilol (COREG)  tablet 6.25 mg, 6.25 mg, Oral, BID WC, Charlynne Cousins, MD, 6.25 mg at 10/06/19 0920 .  clonazePAM (KLONOPIN) tablet 1 mg, 1 mg, Oral, TID PRN, Jonelle Sidle, Mohammad L, MD .  feeding supplement (ENSURE ENLIVE) (ENSURE ENLIVE) liquid 237 mL, 237 mL, Oral, BID BM, Charlynne Cousins, MD, 237 mL at 10/06/19 0920 .  hydrALAZINE (APRESOLINE) tablet 10 mg, 10 mg, Oral, Q6H PRN, Elwyn Reach, MD, 10 mg at 10/02/19 [id number] .  hydrochlorothiazide (MICROZIDE) capsule 12.5 mg, 12.5 mg, Oral, Daily, Charlynne Cousins, MD, 12.5 mg at 10/06/19 0919 .  HYDROcodone-acetaminophen (NORCO) 10-325 MG per tablet 1 tablet, 1 tablet, Oral, Q4H PRN, Charlynne Cousins, MD, 1 tablet at 10/05/19 1455 .  isosorbide-hydrALAZINE (BIDIL) 20-37.5 MG per tablet 1 tablet, 1 tablet, Oral, TID, Charlynne Cousins, MD, 1 tablet at 10/06/19 0919 .  lisinopril (ZESTRIL) tablet 20 mg, 20 mg, Oral, Daily, Jonelle Sidle, Mohammad L, MD, 20 mg at 10/06/19 0920 .  loratadine (CLARITIN) tablet 10 mg, 10 mg, Oral, Daily, Charlynne Cousins, MD, 10 mg at 10/06/19 0920 .  meclizine (ANTIVERT) tablet 12.5 mg, 12.5 mg, Oral, BID PRN, Charlynne Cousins, MD, 12.5 mg at 10/05/19 1455 .  multivitamin with minerals tablet 1 tablet, 1 tablet, Oral, Daily, Charlynne Cousins, MD, 1 tablet at 10/06/19 0920 .  ondansetron (ZOFRAN) tablet 4 mg, 4 mg, Oral, Q6H PRN, 4 mg at 10/05/19 1455 **OR** ondansetron (ZOFRAN) injection 4 mg, 4 mg, Intravenous, Q6H PRN, Elwyn Reach, MD, 4 mg at 10/05/19 0640 .  polyethylene glycol (MIRALAX /  GLYCOLAX) packet 17 g, 17 g, Oral, Daily, Charlynne Cousins, MD, 17 g at 10/06/19 0920   Patients Current Diet:     Diet Order                      Diet - low sodium heart healthy           Diet Heart Room service appropriate? Yes; Fluid consistency: Thin  Diet effective now                   Precautions / Restrictions Precautions Precautions: Fall Restrictions Weight Bearing Restrictions: No     Has the patient had 2 or more falls or a fall with injury in the past year?No   Prior Activity Level Community (5-7x/wk): no DME prior to admission, working as a caregiver a few hours a week   Prior Functional Level Prior Function Level of Independence: Independent Comments: Works as caregiver   Self Care: Did the patient need help bathing, dressing, using the toilet or eating?  Independent   Indoor Mobility: Did the patient need assistance with walking from room to room (with or without device)? Independent   Stairs: Did the patient need assistance with internal or external stairs (with or without device)? Independent   Functional Cognition: Did the patient need help planning regular tasks such as shopping or remembering to take medications? Independent   Home Assistive Devices / Equipment Home Assistive Devices/Equipment: None   Prior Device Use: Indicate devices/aids used by the patient prior to current illness, exacerbation or injury? None of the above   Current Functional Level Cognition   Arousal/Alertness: Awake/alert Overall Cognitive Status: (did not formally assess as patient was very sleepy,  She did wake for exercises but refused OOB this session.) Current Attention Level: Sustained Orientation Level: Oriented X4 Following Commands: Follows one step commands consistently, Follows one step commands with increased time, Follows multi-step commands with increased time Safety/Judgement: Decreased awareness of safety, Decreased awareness of deficits General Comments: patient with requiring cueing for safety awareness and problem solving, decreased attention and requires redirection; not impulsive during OT session today Attention: Sustained Sustained Attention: Impaired Sustained Attention Impairment: Verbal complex, Functional basic Memory: Impaired Memory Impairment: Decreased short term memory Decreased Short Term Memory: Verbal basic Awareness: Impaired Executive  Function: Self Monitoring Self Monitoring: Impaired Behaviors: Impulsive    Extremity Assessment (includes Sensation/Coordination)   Upper Extremity Assessment: Overall WFL for tasks assessed RUE Deficits / Details: Strength 5/5 LUE Deficits / Details: Strength 5/5  Lower Extremity Assessment: Defer to PT evaluation RLE Deficits / Details: Strength 5/5 LLE Deficits / Details: Strength 5/5     ADLs   Overall ADL's : Needs assistance/impaired Eating/Feeding: Set up, Sitting Grooming: Min guard, Sitting, Standing, Wash/dry hands, Wash/dry face Grooming Details (indicate cue type and reason): min guard for safety and balance if standing; supervision sitting Upper Body Bathing: Min guard, Sitting Lower Body Bathing: Minimal assistance, Sit to/from stand Lower Body Bathing Details (indicate cue type and reason): for balance Upper Body Dressing : Min guard, Sitting Lower Body Dressing: Minimal assistance, Sit to/from stand Toilet Transfer: Minimal assistance, Ambulation, RW Toilet Transfer Details (indicate cue type and reason): simulated in room Toileting- Clothing Manipulation and Hygiene: Minimal assistance, Sit to/from stand Toileting - Clothing Manipulation Details (indicate cue type and reason): Min A for balance and gown management Functional mobility during ADLs: Minimal assistance, Rolling walker General ADL Comments: limited by dizziness, educated on visual focal point but  poor attention and safety limiting ADL independence     Mobility   Overal bed mobility: Needs Assistance Bed Mobility: Supine to Sit, Sit to Supine Supine to sit: Min guard Sit to supine: Min guard General bed mobility comments: cues to boost to [facility].     Transfers   Overall transfer level: Needs assistance Equipment used: Rolling walker (2 wheeled) Transfers: Sit to/from Stand Sit to Stand: Min assist Stand pivot transfers: Mod assist, +2 safety/equipment General transfer comment: cueing for hand  placement and technique      Ambulation / Gait / Stairs / Wheelchair Mobility   Ambulation/Gait Ambulation/Gait assistance: Herbalist (Feet): 10 Feet(x2) Assistive device: Rolling walker (2 wheeled) Gait Pattern/deviations: Step-through pattern, Staggering right, Ataxic, Trunk flexed, Scissoring, Narrow base of support General Gait Details: Pt continues to push device too far forward and remains with difficulty following commands to stay safe in device.     Posture / Balance Balance Overall balance assessment: Needs assistance Sitting-balance support: No upper extremity supported, Feet supported Sitting balance-Leahy Scale: Fair Standing balance support: Bilateral upper extremity supported, During functional activity Standing balance-Leahy Scale: Poor Standing balance comment: relaint on BUE and external support     Special needs/care consideration BiPAP/CPAP no CPMno Continuous Drip IV  Dialysis no        Days n/a Life Vest no Oxygen no Special Bed no Trach Size no Wound Vac (area) no      Location n/a Skin intact                              Location Bowel mgmt: continent Bladder mgmt: continent Diabetic mgmt no Behavioral consideration no Chemo/radiation no        Previous Home Environment (from acute therapy documentation) Living Arrangements: Alone  Lives With: Alone Available Help at Discharge: Family, Available PRN/intermittently(daughters, sister) Type of Home: Apartment Home Layout: One level Home Access: Level entry Bathroom Shower/Tub: Chiropodist: Standard Home Care Services: No   Discharge Living Setting Plans for Discharge Living Setting: Patient's home Type of Home at Discharge: Apartment Discharge Home Layout: One level Discharge Home Access: Level entry Discharge Bathroom Shower/Tub: Tub/shower unit Discharge Bathroom Toilet: Standard Discharge Bathroom Accessibility: Yes How Accessible: Accessible via  walker Does the patient have any problems obtaining your medications?: No   Social/Family/Support Systems Anticipated Caregiver: mod I goals per Dr. Ranell Patrick Ability/Limitations of Caregiver: daughter, Hoyle Sauer [phone], works days Caregiver Availability: Intermittent Discharge Plan Discussed with Primary Caregiver: Yes Is Caregiver In Agreement with Plan?: Yes Does Caregiver/Family have Issues with Lodging/Transportation while Pt is in Rehab?: No     Goals/Additional Needs Patient/Family Goal for Rehab: PT/OT/SLP mod I Expected length of stay: 9-12 days Dietary Needs: heart healthy/thin Pt/Family Agrees to Admission and willing to participate: Yes Program Orientation Provided & Reviewed with Pt/Caregiver Including Roles  & Responsibilities: Yes  Barriers to Discharge: Insurance for SNF coverage     Decrease burden of Care through IP rehab admission: n/a     Possible need for SNF placement upon discharge: Not anticipated.  Pt with mod I goals.      Patient Condition: This patient's medical and functional status has changed since the consult dated: 10/03/2019 in which the Rehabilitation Physician determined and documented that the patient's condition is appropriate for intensive rehabilitative care in an inpatient rehabilitation facility. See "History of Present Illness" (above) for medical update. Functional changes are: min assist x10' .  Patient's medical and functional status update has been discussed with the Rehabilitation physician and patient remains appropriate for inpatient rehabilitation. Will admit to inpatient rehab today.   Preadmission Screen Completed By:  Michel Santee, PT, DPT 10/06/2019 9:40 AM ______________________________________________________________________   Discussed status with Dr. Dagoberto Ligas on 10/06/19 at 9:46 AM  and received approval for admission today.   Admission Coordinator:  Michel Santee, PT, DPT time 9:48 AM Sudie Grumbling 10/06/19          Cosigned  by: Courtney Heys, MD at 10/06/2019 11:16 AM

## 2019-10-06 NOTE — Progress Notes (Signed)
Pt arrived to unit via transport chair x 2 staff assist from 3W. Pt transferred to bed x 1 assist s/p. Denies pain. Oriented to unit and safety precautions. Pt wearing mask while staff in room. Call bell placed near pt with bed alarm on. Will cont to monitor.   Erie Noe, RN

## 2019-10-06 NOTE — Progress Notes (Signed)
[facility] PROGRESS NOTE    Progress Note  Kiylee Mangar  [id number] DOB: Mar 28, 1951 DOA: 09/30/2019 PCP: Patient, No Pcp Per     Brief Narrative:   Dail Marciano is an 69 y.o. female past medical history significant for hypertension, DJD IBS who presents with dizziness and nausea started on the day of admission she relates she woke up in the room was spinning around her and almost fell to the ground became nauseated and vomiting.  MRI of the brain showed acute/subacute early microhemorrhage or hemorrhagic lacunar infarct in the posterior pons, chronic small vessel ischemia with multiple lacunar infarcts in the basal ganglia bilaterally and thalamus.  She also had multiple supratentorial chronic microhemorrhagic changes question hypertensive microangiopathy.  Assessment/Plan:   Dizziness acute cerebrovascular accident (CVA) [facility]): Neurology has been consulted which is concerned about microhemorrhages secondary to amyloid angiopathy, with chronic hypertensive microangiopathy.  They recommended to keep her goal blood pressure less than 140/80. Pressure continues to improve, continue hydrochlorothiazide, Norvasc, lisinopril, Coreg and BiDil. Physical therapy evaluated the patient and recommended CIR awaiting insurance approval.  Essential hypertension: Blood pressure is improved, will continue current regimen.  Bilateral primary osteoarthritis of knee: Continue tramadol for pain avoid ibuprofen.  Abnormal EKG: Likely due to longstanding uncontrolled hypertension.   DVT prophylaxis: lovenox Family Communication:none Disposition Plan/Barrier to D/C: Physical therapy evaluated the patient recommended inpatient rehab.  Awaiting insurance approval for CIR.  Code Status:     Code Status Orders  (From admission, onward)         Start     Ordered   10/01/19 0011  Full code  Continuous     10/01/19 0012        Code Status History    This patient has a  current code status but no historical code status.   Advance Care Planning Activity        IV Access:    Peripheral IV   Procedures and diagnostic studies:   No results found.   Medical Consultants:    None.  Anti-Infectives:   None  Subjective:    Eponine Koeller she is now complaining of left-sided face pain  Objective:    Vitals:   10/05/19 1541 10/05/19 2000 10/06/19 0000 10/06/19 0400  BP: 103/78 130/78 103/64 (!) 134/91  Pulse: 88 76 86 83  Resp: 20 18 20 20   Temp: 98.6 F (37 C) 98 F (36.7 C) 98.9 F (37.2 C) 98.6 F (37 C)  TempSrc: Oral Oral Oral Oral  SpO2: 96% 99% 99% 97%  Weight:      Height:       SpO2: 97 %   Intake/Output Summary (Last 24 hours) at 10/06/2019 0855 Last data filed at 10/06/2019 0700 Gross per 24 hour  Intake --  Output 550 ml  Net -550 ml   Filed Weights   09/30/19 2345  Weight: 83.5 kg    Exam: General exam: In no acute distress. Respiratory system: Good air movement and clear to auscultation. Cardiovascular system: S1 & S2 heard, RRR. No JVD. Gastrointestinal system: Abdomen is nondistended, soft and nontender.  Extremities: No pedal edema. Skin: The left side of her face is not warm to touch or erythematous it is tender, mildly swollen.  Data Reviewed:    Labs: Basic Metabolic Panel: Recent Labs  Lab 09/30/19 1407 09/30/19 1407 10/01/19 0039 10/01/19 0039 10/04/19 0328 10/04/19 0328 10/05/19 0648 10/06/19 0239  NA 143  --  142  --  138  --  135  136  K 4.2   < > 4.3   < > 4.2   < > 4.3 4.8  CL 105  --  106  --  101  --  96* 99  CO2 27  --  27  --  30  --  30 23  GLUCOSE 163*  --  130*  --  109*  --  97 111*  BUN 23  --  16  --  19  --  16 24*  CREATININE 1.00  --  1.01*  --  1.01*  --  0.98 1.21*  CALCIUM 8.9  --  8.8*  --  9.1  --  9.0 8.8*   < > = values in this interval not displayed.   GFR Estimated Creatinine Clearance: 50.1 mL/min (A) (by C-G formula based on SCr of 1.21 mg/dL  (H)). Liver Function Tests: Recent Labs  Lab 09/30/19 1407 10/01/19 0039  AST 16 16  ALT 16 14  ALKPHOS 65 61  BILITOT 0.6 0.8  PROT 7.2 7.1  ALBUMIN 3.7 3.5   Recent Labs  Lab 09/30/19 1407  LIPASE 24   No results for input(s): AMMONIA in the last 168 hours. Coagulation profile No results for input(s): INR, PROTIME in the last 168 hours. COVID-19 Labs  No results for input(s): DDIMER, FERRITIN, LDH, CRP in the last 72 hours.  No results found for: SARSCOV2NAA  CBC: Recent Labs  Lab 09/30/19 1407 10/01/19 0039  WBC 4.9 5.3  HGB 14.2 14.1  HCT 46.5* 45.9  MCV 90.3 89.3  PLT 259 305   Cardiac Enzymes: No results for input(s): CKTOTAL, CKMB, CKMBINDEX, TROPONINI in the last 168 hours. BNP (last 3 results) No results for input(s): PROBNP in the last 8760 hours. CBG: No results for input(s): GLUCAP in the last 168 hours. D-Dimer: No results for input(s): DDIMER in the last 72 hours. Hgb A1c: No results for input(s): HGBA1C in the last 72 hours. Lipid Profile: No results for input(s): CHOL, HDL, LDLCALC, TRIG, CHOLHDL, LDLDIRECT in the last 72 hours. Thyroid function studies: No results for input(s): TSH, T4TOTAL, T3FREE, THYROIDAB in the last 72 hours.  Invalid input(s): FREET3 Anemia work up: No results for input(s): VITAMINB12, FOLATE, FERRITIN, TIBC, IRON, RETICCTPCT in the last 72 hours. Sepsis Labs: Recent Labs  Lab 09/30/19 1407 10/01/19 0039  WBC 4.9 5.3   Microbiology No results found for this or any previous visit (from the past 240 hour(s)).   Medications:   . amLODipine  10 mg Oral Daily  . aspirin  325 mg Oral Daily  . atorvastatin  40 mg Oral q1800  . carvedilol  6.25 mg Oral BID WC  . feeding supplement (ENSURE ENLIVE)  237 mL Oral BID BM  . hydrochlorothiazide  12.5 mg Oral Daily  . isosorbide-hydrALAZINE  1 tablet Oral TID  . lisinopril  20 mg Oral Daily  . loratadine  10 mg Oral Daily  . multivitamin with minerals  1 tablet  Oral Daily  . polyethylene glycol  17 g Oral Daily   Continuous Infusions: . sodium chloride 10 mL/hr at 10/01/19 1041      LOS: 6 days   Charlynne Cousins  [facility]  10/06/2019, 8:55 AM

## 2019-10-06 NOTE — TOC Transition Note (Signed)
Transition of Care [facility]) - CM/SW Discharge Note   Patient Details  Name: Alison Whitehead MRN: [id number] Date of Birth: May 26, 1951  [facility] of [facility] CM/SW Contact:  Pollie Friar, RN Phone Number: 10/06/2019, 9:57 AM   Clinical Narrative:    Pt discharging to CIR today. CM signing off.    Final next level of care: IP Rehab Facility Barriers to Discharge: No Barriers Identified   Patient Goals and CMS Choice     Choice offered to / list presented to : Patient  Discharge Placement                       Discharge Plan and Services   Discharge Planning Services: CM Consult                                 Social Determinants of Health (SDOH) Interventions     Readmission Risk Interventions No flowsheet data found.

## 2019-10-06 NOTE — Discharge Summary (Addendum)
Physician Discharge Summary  Alison Whitehead [id number] DOB: May 30, 1951 DOA: 09/30/2019  PCP: Patient, No Pcp Per  Admit date: 09/30/2019 Discharge date: 10/06/2019  Admitted From: Home Disposition:  CIR  Recommendations for Outpatient Follow-up:  1. Patient will go to CIR, will titrate antihypertensive medications as needed her goal blood pressure is less than 130/80.  Home Health:No Equipment/Devices:None  Discharge Condition:Stable CODE STATUS:Full Diet recommendation: Heart Healthy   Brief/Interim Summary: 69 y.o. female past medical history significant for hypertension, DJD IBS who presents with dizziness and nausea started on the day of admission she relates she woke up in the room was spinning around her and almost fell to the ground became nauseated and vomiting.  MRI of the brain showed acute/subacute early microhemorrhage or hemorrhagic lacunar infarct in the posterior pons, chronic small vessel ischemia with multiple lacunar infarcts in the basal ganglia bilaterally and thalamus.  She also had multiple supratentorial chronic microhemorrhagic changes question hypertensive microangiopathy.  Discharge Diagnoses:  Principal Problem:   Acute cerebrovascular accident (CVA) ([place]) Active Problems:   Essential hypertension   Bilateral primary osteoarthritis of knee Chronic diastolic heart failure Dizziness question due to acute CVA: Neurology was consulted which was concerned about micro hemorrhage secondary to amyloid angiopathy in the setting of chronic hypertensive microangiopathy. They recommended low blood pressure of less than 140/80. Carotid Doppler showed ICA stenosis on thyroid 9%. 2D echo showed an ejection fraction of 55% with grade 1 diastolic heart failure. Her blood pressure have been titrated up she is currently on hydrochlorothiazide, Coreg, amlodipine and benazepril and her blood pressure has been 134/ 91-103/64 continue to monitor it CIR and titrate as  needed  Essential hypertension: See above for the details we will continue to titrate antihypertensive medication as an outpatient as tolerated.  Bilateral primary osteoarthritis of the knee: Continue tramadol avoid ibuprofen.  Abnormal EKG: Likely due to longstanding uncontrolled hypertension.  Discharge Instructions  Discharge Instructions    Diet - low sodium heart healthy   Complete by: As directed    Increase activity slowly   Complete by: As directed      Allergies as of 10/06/2019      Reactions   Penicillins Itching      Medication List    STOP taking these medications   amLODipine 10 MG tablet Commonly known as: NORVASC   clonazePAM 1 MG tablet Commonly known as: KLONOPIN   ibuprofen 800 MG tablet Commonly known as: ADVIL     TAKE these medications   amlodipine-benazepril 2.5-10 MG capsule Commonly known as: LOTREL Take 1 capsule by mouth daily.   aspirin EC 81 MG tablet Take 1 tablet (81 mg total) by mouth daily.   atorvastatin 40 MG tablet Commonly known as: LIPITOR Take 1 tablet (40 mg total) by mouth daily at 6 PM.   carvedilol 6.25 MG tablet Commonly known as: COREG Take 1 tablet (6.25 mg total) by mouth 2 (two) times daily with a meal.   diclofenac sodium 1 % Gel Commonly known as: VOLTAREN Apply 2-4 g topically 4 (four) times daily. What changed:   when to take this  reasons to take this   hydrochlorothiazide 12.5 MG capsule Commonly known as: MICROZIDE Take 1 capsule (12.5 mg total) by mouth daily. Start taking on: October 07, 2019   HYDROcodone-acetaminophen 10-325 MG tablet Commonly known as: NORCO Take 1 tablet by mouth 3 (three) times daily as needed. What changed: reasons to take this   lisinopril 20 MG tablet Commonly known as: ZESTRIL  Take 1 tablet (20 mg total) by mouth daily.   loratadine 10 MG tablet Commonly known as: CLARITIN Take 10 mg by mouth daily.   meclizine 12.5 MG tablet Commonly known as: ANTIVERT Take  1 tablet (12.5 mg total) by mouth 2 (two) times daily as needed for dizziness.   metoprolol succinate 25 MG 24 hr tablet Commonly known as: TOPROL-XL Take 25 mg by mouth daily.       Allergies  Allergen Reactions  . Penicillins Itching    Consultations:  Neurology   Procedures/Studies: CT Head Wo Contrast  Result Date: 09/30/2019 CLINICAL DATA:  Dizziness. EXAM: CT HEAD WITHOUT CONTRAST TECHNIQUE: Contiguous axial images were obtained from the base of the skull through the vertex without intravenous contrast. COMPARISON:  None. FINDINGS: Brain: No evidence of acute infarction, hemorrhage, hydrocephalus, extra-axial collection or mass lesion/mass effect. Extensive hypoattenuation in the subcortical and periventricular deep white matter consistent with chronic microvascular ischemic change is noted. Vascular: Atherosclerosis. Skull: Intact.  No focal lesion. Sinuses/Orbits: Paranasal sinuses are clear. The patient has a remote fracture of the medial wall of the right orbit and remote nasal bone fractures. Other: None. IMPRESSION: No acute abnormality. Extensive chronic microvascular ischemic change. Atherosclerosis. Remote fractures of the medial wall of the right orbit and nasal bones. Electronically Signed   By: Inge Rise M.D.   On: 09/30/2019 16:13   MR BRAIN WO CONTRAST  Result Date: 09/30/2019 CLINICAL DATA:  Ataxia, stroke suspected. Additional history provided: Dizziness and nausea. EXAM: MRI HEAD WITHOUT CONTRAST TECHNIQUE: Multiplanar, multiecho pulse sequences of the brain and surrounding structures were obtained without intravenous contrast. COMPARISON:  Noncontrast head CT performed earlier the same day 09/30/2019 FINDINGS: Brain: There is a subcentimeter focus of restricted diffusion within the dorsal right pons. Corresponding punctate focus of SWI signal loss at this site (series 7, image 52) (series 14, image 14). Findings are suggestive of an acute/early subacute  microhemorrhage or hemorrhagic lacunar infarct. No evidence of acute infarct elsewhere within the brain. No evidence of intracranial mass. No midline shift or extra-axial fluid collection. Severe patchy and confluent T2/FLAIR hyperintensity within the cerebral white matter which is nonspecific, but consistent with chronic small vessel ischemic disease. Multiple chronic lacunar infarcts within the bilateral basal ganglia and thalami. To a lesser degree, there are chronic small-vessel ischemic changes within the pons. There are multiple chronic microhemorrhages within the right basal ganglia and bilateral thalami. A few chronic microhemorrhages are also present within the posterior cerebral hemispheres. Cerebral volume is normal for age. Vascular: Flow voids maintained within the proximal large arterial vessels. Skull and upper cervical spine: No focal marrow lesion. Sinuses/Orbits: Visualized orbits demonstrate no acute abnormality. Mild scattered paranasal sinus mucosal thickening. Trace fluid within left mastoid air cells. IMPRESSION: 1. Findings consistent with acute/early subacute microhemorrhage or hemorrhagic lacunar infarct in the posterior right pons as described. 2. Advanced chronic small vessel ischemic disease. Multiple chronic lacunar infarcts in the bilateral basal ganglia and thalami. 3. Multiple supratentorial chronic microhemorrhages with a central predominance. 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 4. Trace left mastoid effusion Electronically Signed   By: Kellie Simmering DO   On: 09/30/2019 21:14   CT Abdomen Pelvis W Contrast  Result Date: 09/30/2019 CLINICAL DATA:  Nausea, vomiting and weakness. EXAM: CT ABDOMEN AND PELVIS WITH CONTRAST TECHNIQUE: Multidetector CT imaging of the abdomen and pelvis was performed using the standard protocol following bolus administration of intravenous contrast. CONTRAST:  150mL OMNIPAQUE IOHEXOL  300 MG/ML  SOLN  COMPARISON:  05/20/2016 FINDINGS: Lower chest: Patchy bibasilar infiltrates are noted, left greater than right. No pleural effusions. The heart is enlarged. No pericardial effusion. Moderate tortuosity of the thoracic aorta. The distal esophagus is grossly normal. Hepatobiliary: No focal hepatic lesions or intrahepatic biliary dilatation. The gallbladder is grossly normal. No common bile duct dilatation. Pancreas: No mass, inflammation or ductal dilatation. Spleen: Normal size. No focal lesions. Adrenals/Urinary Tract: The adrenal glands are unremarkable. The kidneys demonstrate moderate renal cortical scarring type changes but no evidence of acute pyelonephritis or abscess. No hydroureteronephrosis. The bladder is grossly normal. Stomach/Bowel: The stomach, duodenum, small bowel and colon are grossly normal without oral contrast. No acute inflammatory changes, mass lesions or obstructive findings. The terminal ileum and appendix are normal. Fairly extensive descending and sigmoid colon diverticulosis but no findings for acute diverticulitis. Vascular/Lymphatic: The aorta and branch vessels are patent. Mild tortuosity and scattered atherosclerotic calcifications. No aneurysm. Branch vessels are patent. The major venous structures are patent. No mesenteric or retroperitoneal mass or adenopathy. Reproductive: Enlarged fibroid uterus. The ovaries are grossly normal. Other: No pelvic mass or adenopathy. No free pelvic fluid collections. No inguinal mass or adenopathy. Small anterior abdominal wall hernia containing fat. Musculoskeletal: No significant bony findings. IMPRESSION: 1. No acute abdominal/pelvic findings, mass lesions or adenopathy. 2. Bibasilar infiltrates, left greater than right. 3. Enlarged fibroid uterus. 4. Descending and sigmoid colon diverticulosis without findings for acute diverticulitis. 5. Small anterior abdominal wall hernia containing fat. Electronically Signed   By: Marijo Sanes M.D.   On:  09/30/2019 16:38   ECHOCARDIOGRAM COMPLETE  Result Date: 10/02/2019    ECHOCARDIOGRAM REPORT   Patient Name:   [facility] Date of Exam: 10/02/2019 Medical Rec #:  [id number]            Height:       67.0 in Accession #:    [id number]           Weight:       184.1 lb Date of Birth:  1950-12-03           BSA:          1.952 m Patient Age:    77 years             BP:           139/86 mmHg Patient Gender: F                    HR:           70 bpm. Exam Location:  Inpatient Procedure: 2D Echo Indications:    stroke 434.91  History:        Patient has no prior history of Echocardiogram examinations.                 Risk Factors:Hypertension.  Sonographer:    Johny Chess Referring Phys: [place]  1. Left ventricular ejection fraction, by estimation, is 55 to 60%. The left ventricle has normal function. The left ventricle has no regional wall motion abnormalities. The left ventricular internal cavity size was severely dilated. Left ventricular diastolic parameters are consistent with Grade I diastolic dysfunction (impaired relaxation). Elevated left ventricular end-diastolic pressure.  2. Right ventricular systolic function is normal. The right ventricular size is normal. There is normal pulmonary artery systolic pressure.  3. The mitral valve is normal in structure and function. Trivial mitral valve regurgitation. No evidence of mitral stenosis.  4. The aortic valve is tricuspid. Aortic valve regurgitation is trivial. Mild to moderate aortic valve sclerosis/calcification is present, without any evidence of aortic stenosis.  5. Aortic dilatation noted. 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  6. The inferior vena cava is normal in size with greater than 50% respiratory variability, suggesting right atrial pressure of 3 mmHg. FINDINGS  Left Ventricle: Left ventricular ejection fraction, by estimation, is 55 to 60%. The left ventricle has normal function. The  left ventricle has no regional wall motion abnormalities. The left ventricular internal cavity size was severely dilated. There is no left ventricular hypertrophy. Left ventricular diastolic parameters are consistent with Grade I diastolic dysfunction (impaired relaxation). Elevated left ventricular end-diastolic pressure. Right Ventricle: The right ventricular size is normal. No increase in right ventricular wall thickness. Right ventricular systolic function is normal. There is normal pulmonary artery systolic pressure. The tricuspid regurgitant velocity is 2.47 m/s, and  with an assumed right atrial pressure of 3 mmHg, the estimated right ventricular systolic pressure is [id number]. Left Atrium: Left atrial size was normal in size. Right Atrium: Right atrial size was normal in size. Pericardium: There is no evidence of pericardial effusion. Mitral Valve: The mitral valve is normal in structure and function. Normal mobility of the mitral valve leaflets. Trivial mitral valve regurgitation. No evidence of mitral valve stenosis. Tricuspid Valve: The tricuspid valve is normal in structure. Tricuspid valve regurgitation is trivial. No evidence of tricuspid stenosis. Aortic Valve: The aortic valve is tricuspid. . There is moderate thickening and moderate calcification of the aortic valve. Aortic valve regurgitation is trivial. Mild to moderate aortic valve sclerosis/calcification is present, without any evidence of aortic stenosis. There is moderate thickening of the aortic valve. There is moderate calcification of the aortic valve. Pulmonic Valve: The pulmonic valve was normal in structure. Pulmonic valve regurgitation is not visualized. No evidence of pulmonic stenosis. Aorta: The aortic root is normal in size and structure and aortic dilatation noted. 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 Venous: The inferior vena cava is normal in size with greater than 50% respiratory variability,  suggesting right atrial pressure of 3 mmHg. IAS/Shunts: The interatrial septum appears to be lipomatous. No atrial level shunt detected by color flow Doppler.  LEFT VENTRICLE PLAX 2D LVIDd:         5.70 cm  Diastology LVIDs:         4.20 cm  LV e' lateral:   7.83 cm/s LV PW:         1.30 cm  LV E/e' lateral: 11.6 LV IVS:        1.10 cm  LV e' medial:    5.11 cm/s LVOT diam:     2.10 cm  LV E/e' medial:  17.8 LV SV:         65 LV SV Index:   33 LVOT Area:     3.46 cm  RIGHT VENTRICLE RV S prime:     11.60 cm/s TAPSE (M-mode): 1.7 cm LEFT ATRIUM             Index       RIGHT ATRIUM           Index LA diam:        4.60 cm 2.36 cm/m  RA Area:     15.50 cm LA Vol (A2C):   49.5 ml 25.36 ml/m RA Volume:   36.90 ml  18.90 ml/m LA Vol (A4C):  44.1 ml 22.59 ml/m LA Biplane Vol: 49.4 ml 25.30 ml/m  AORTIC VALVE LVOT Vmax:   86.80 cm/s LVOT Vmean:  54.700 cm/s LVOT VTI:    0.187 m  AORTA Ao Root diam: 3.00 cm Ao Asc diam:  4.30 cm MITRAL VALVE               TRICUSPID VALVE MV Area (PHT): 4.39 cm    TR Peak grad:   24.4 mmHg MV Decel Time: 173 msec    TR Vmax:        247.00 cm/s MV E velocity: 90.80 cm/s MV A velocity: 99.40 cm/s  SHUNTS MV E/A ratio:  0.91        Systemic VTI:  0.19 m                            Systemic Diam: 2.10 cm Fransico Him MD Electronically signed by Fransico Him MD Signature Date/Time: 10/02/2019/11:16:07 AM    Final    VAS US CAROTID  Result Date: 10/03/2019 Carotid Arterial Duplex Study Indications:       CVA. Risk Factors:      Hypertension. Comparison Study:  no prior Performing Technologist: Abram Sander RVS  Examination Guidelines: A complete evaluation includes B-mode imaging, spectral Doppler, color Doppler, and power Doppler as needed of all accessible portions of each vessel. Bilateral testing is considered an integral part of a complete examination. Limited examinations for reoccurring indications may be performed as noted.  Right Carotid Findings:  +----------+--------+--------+--------+------------------+--------+           PSV cm/sEDV cm/sStenosisPlaque DescriptionComments +----------+--------+--------+--------+------------------+--------+ CCA Prox  68      16              heterogenous               +----------+--------+--------+--------+------------------+--------+ CCA Distal50      17              heterogenous               +----------+--------+--------+--------+------------------+--------+ ICA Prox  40      20      1-39%   heterogenous               +----------+--------+--------+--------+------------------+--------+ ICA Distal26      10                                         +----------+--------+--------+--------+------------------+--------+ ECA       73      11                                         +----------+--------+--------+--------+------------------+--------+ +----------+--------+-------+--------+-------------------+           PSV cm/sEDV cmsDescribeArm Pressure (mmHG) +----------+--------+-------+--------+-------------------+ [id number]                                         +----------+--------+-------+--------+-------------------+ +---------+--------+--+--------+--+---------+ VertebralPSV cm/s39EDV cm/s12Antegrade +---------+--------+--+--------+--+---------+  Left Carotid Findings: +----------+--------+--------+--------+------------------+--------+           PSV cm/sEDV cm/sStenosisPlaque DescriptionComments +----------+--------+--------+--------+------------------+--------+ CCA Prox  60      19  heterogenous               +----------+--------+--------+--------+------------------+--------+ CCA Distal45      16              heterogenous               +----------+--------+--------+--------+------------------+--------+ ICA Prox  43      20      1-39%   heterogenous                +----------+--------+--------+--------+------------------+--------+ ICA Distal38      17                                         +----------+--------+--------+--------+------------------+--------+ ECA       48      10                                         +----------+--------+--------+--------+------------------+--------+ +----------+--------+--------+--------+-------------------+           PSV cm/sEDV cm/sDescribeArm Pressure (mmHG) +----------+--------+--------+--------+-------------------+ [id number]                                          +----------+--------+--------+--------+-------------------+ +---------+--------+--+--------+-+---------+ VertebralPSV cm/s29EDV cm/s8Antegrade +---------+--------+--+--------+-+---------+   Summary: Right Carotid: Velocities in the right ICA are consistent with a 1-39% stenosis. Left Carotid: Velocities in the left ICA are consistent with a 1-39% stenosis. Vertebrals: Bilateral vertebral arteries demonstrate antegrade flow. *See table(s) above for measurements and observations.  Electronically signed by Antony Contras MD on 10/03/2019 at [id number] AM.    Final      Subjective: No complaints.  Discharge Exam: Vitals:   10/06/19 0000 10/06/19 0400  BP: 103/64 (!) 134/91  Pulse: 86 83  Resp: 20 20  Temp: 98.9 F (37.2 C) 98.6 F (37 C)  SpO2: 99% 97%   Vitals:   10/05/19 1541 10/05/19 2000 10/06/19 0000 10/06/19 0400  BP: 103/78 130/78 103/64 (!) 134/91  Pulse: 88 76 86 83  Resp: 20 18 20 20   Temp: 98.6 F (37 C) 98 F (36.7 C) 98.9 F (37.2 C) 98.6 F (37 C)  TempSrc: Oral Oral Oral Oral  SpO2: 96% 99% 99% 97%  Weight:      Height:        General: Pt is alert, awake, not in acute distress Cardiovascular: RRR, S1/S2 +, no rubs, no gallops Respiratory: CTA bilaterally, no wheezing, no rhonchi Abdominal: Soft, NT, ND, bowel sounds + Extremities: no edema, no cyanosis    The results of significant diagnostics  from this hospitalization (including imaging, microbiology, ancillary and laboratory) are listed below for reference.     Microbiology: No results found for this or any previous visit (from the past 240 hour(s)).   Labs: BNP (last 3 results) No results for input(s): BNP in the last 8760 hours. Basic Metabolic Panel: Recent Labs  Lab 09/30/19 1407 10/01/19 0039 10/04/19 0328 10/05/19 0648 10/06/19 0239  NA 143 142 138 135 136  K 4.2 4.3 4.2 4.3 4.8  CL 105 106 101 96* 99  CO2 27 27 30 30 23   GLUCOSE 163* 130* 109* 97 111*  BUN 23 16 19 16  24*  CREATININE 1.00 1.01* 1.01* 0.98 1.21*  CALCIUM 8.9 8.8* 9.1 9.0 8.8*   Liver Function Tests: Recent Labs  Lab 09/30/19 1407 10/01/19 0039  AST 16 16  ALT 16 14  ALKPHOS 65 61  BILITOT 0.6 0.8  PROT 7.2 7.1  ALBUMIN 3.7 3.5   Recent Labs  Lab 09/30/19 1407  LIPASE 24   No results for input(s): AMMONIA in the last 168 hours. CBC: Recent Labs  Lab 09/30/19 1407 10/01/19 0039  WBC 4.9 5.3  HGB 14.2 14.1  HCT 46.5* 45.9  MCV 90.3 89.3  PLT 259 305   Cardiac Enzymes: No results for input(s): CKTOTAL, CKMB, CKMBINDEX, TROPONINI in the last 168 hours. BNP: Invalid input(s): POCBNP CBG: No results for input(s): GLUCAP in the last 168 hours. D-Dimer No results for input(s): DDIMER in the last 72 hours. Hgb A1c No results for input(s): HGBA1C in the last 72 hours. Lipid Profile No results for input(s): CHOL, HDL, LDLCALC, TRIG, CHOLHDL, LDLDIRECT in the last 72 hours. Thyroid function studies No results for input(s): TSH, T4TOTAL, T3FREE, THYROIDAB in the last 72 hours.  Invalid input(s): FREET3 Anemia work up No results for input(s): VITAMINB12, FOLATE, FERRITIN, TIBC, IRON, RETICCTPCT in the last 72 hours. Urinalysis    Component Value Date/Time   COLORURINE YELLOW 09/30/2019 1630   APPEARANCEUR CLEAR 09/30/2019 1630   LABSPEC 1.024 09/30/2019 1630   PHURINE 8.0 09/30/2019 1630   GLUCOSEU NEGATIVE  09/30/2019 1630   HGBUR NEGATIVE 09/30/2019 1630   BILIRUBINUR NEGATIVE 09/30/2019 1630   KETONESUR NEGATIVE 09/30/2019 1630   PROTEINUR NEGATIVE 09/30/2019 1630   NITRITE NEGATIVE 09/30/2019 1630   LEUKOCYTESUR SMALL (A) 09/30/2019 1630   Sepsis Labs Invalid input(s): PROCALCITONIN,  WBC,  LACTICIDVEN Microbiology No results found for this or any previous visit (from the past 240 hour(s)).   Time coordinating discharge: Over 40 minutes  SIGNED:   Charlynne Cousins, MD  [facility] 10/06/2019, 9:25 AM Pager   If 7PM-7AM, please contact night-coverage [email or web address] Password [email or web address]

## 2019-10-07 ENCOUNTER — Inpatient Hospital Stay (HOSPITAL_COMMUNITY): Payer: Medicare HMO | Admitting: Physical Therapy

## 2019-10-07 ENCOUNTER — Inpatient Hospital Stay (HOSPITAL_COMMUNITY): Payer: Medicare HMO | Admitting: Speech Pathology

## 2019-10-07 ENCOUNTER — Inpatient Hospital Stay (HOSPITAL_COMMUNITY): Payer: Medicare HMO

## 2019-10-07 DIAGNOSIS — I10 Essential (primary) hypertension: Secondary | ICD-10-CM

## 2019-10-07 DIAGNOSIS — R7989 Other specified abnormal findings of blood chemistry: Secondary | ICD-10-CM

## 2019-10-07 LAB — CBC WITH DIFFERENTIAL/PLATELET
Abs Immature Granulocytes: 0.05 10*3/uL (ref 0.00–0.07)
Basophils Absolute: 0.1 10*3/uL (ref 0.0–0.1)
Basophils Relative: 1 %
Eosinophils Absolute: 0.1 10*3/uL (ref 0.0–0.5)
Eosinophils Relative: 2 %
HCT: 44.1 % (ref 36.0–46.0)
Hemoglobin: 13.6 g/dL (ref 12.0–15.0)
Immature Granulocytes: 1 %
Lymphocytes Relative: 27 %
Lymphs Abs: 1.5 10*3/uL (ref 0.7–4.0)
MCH: 27.3 pg (ref 26.0–34.0)
MCHC: 30.8 g/dL (ref 30.0–36.0)
MCV: 88.6 fL (ref 80.0–100.0)
Monocytes Absolute: 0.9 10*3/uL (ref 0.1–1.0)
Monocytes Relative: 16 %
Neutro Abs: 2.9 10*3/uL (ref 1.7–7.7)
Neutrophils Relative %: 53 %
Platelets: 270 10*3/uL (ref 150–400)
RBC: 4.98 MIL/uL (ref 3.87–5.11)
RDW: 13.6 % (ref 11.5–15.5)
WBC: 5.5 10*3/uL (ref 4.0–10.5)
nRBC: 0 % (ref 0.0–0.2)

## 2019-10-07 LAB — GLUCOSE, CAPILLARY
Glucose-Capillary: 106 mg/dL — ABNORMAL HIGH (ref 70–99)
Glucose-Capillary: 96 mg/dL (ref 70–99)
Glucose-Capillary: 137 mg/dL — ABNORMAL HIGH (ref 70–99)
Glucose-Capillary: 80 mg/dL (ref 70–99)

## 2019-10-07 LAB — COMPREHENSIVE METABOLIC PANEL
ALT: 11 U/L (ref 0–44)
AST: 13 U/L — ABNORMAL LOW (ref 15–41)
Albumin: 3.4 g/dL — ABNORMAL LOW (ref 3.5–5.0)
Alkaline Phosphatase: 61 U/L (ref 38–126)
Anion gap: 11 (ref 5–15)
BUN: 28 mg/dL — ABNORMAL HIGH (ref 8–23)
CO2: 26 mmol/L (ref 22–32)
Calcium: 9.3 mg/dL (ref 8.9–10.3)
Chloride: 99 mmol/L (ref 98–111)
Creatinine, Ser: 1.26 mg/dL — ABNORMAL HIGH (ref 0.44–1.00)
GFR calc Af Amer: 51 mL/min — ABNORMAL LOW (ref >60)
GFR calc non Af Amer: 44 mL/min — ABNORMAL LOW (ref >60)
Glucose, Bld: 119 mg/dL — ABNORMAL HIGH (ref 70–99)
Potassium: 4.6 mmol/L (ref 3.5–5.1)
Sodium: 136 mmol/L (ref 135–145)
Total Bilirubin: 0.7 mg/dL (ref 0.3–1.2)
Total Protein: 7 g/dL (ref 6.5–8.1)

## 2019-10-07 MED ORDER — LISINOPRIL 20 MG PO TABS
20.0000 mg | ORAL_TABLET | Freq: Every day | ORAL | Status: DC
  Administered 2019-10-09: 20 mg via ORAL
  Filled 2019-10-07 (×2): qty 1

## 2019-10-07 MED ORDER — ENSURE ENLIVE PO LIQD
237.0000 mL | Freq: Two times a day (BID) | ORAL | Status: DC
  Administered 2019-10-08 – 2019-10-29 (×41): 237 mL via ORAL
  Filled 2019-10-07: qty 237

## 2019-10-07 NOTE — Progress Notes (Signed)
Initial Nutrition Assessment  DOCUMENTATION CODES:   Not applicable  INTERVENTION:   Snacks BID  Continue Ensure Enlive po BID, each supplement provides 350 kcal and 20 grams of protein  Continue MVI daily   NUTRITION DIAGNOSIS:   Increased nutrient needs related to other (see comment)(therapies) as evidenced by estimated needs.    GOAL:   Patient will meet greater than or equal to 90% of their needs    MONITOR:   PO intake, Supplement acceptance, Weight trends, Labs  REASON FOR ASSESSMENT:   Malnutrition Screening Tool    ASSESSMENT:   Pt with a PMH of HTN, IBS, OA bilateral knees, cocaine abuse who was admitted on 10/01/19 with onset of weakness and dizziness followed by N/V. UDS positive for opiates and cocaine. 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  CT abdomen pelvis showed enlarged fibroid uterus with fairly extensive diverticulosis. Pt admitted to [facility] 10/06/19.  Pt reports appetite is good and is eager to receive meal tray. Pt states she typically eats 3 meals a day and always includes a protein, vegetable and a carbohydrate. Pt is agreeable to use of oral nutrition supplements while admitted if necessary. Pt would like to receive snacks while admitted.   PO intake: 60% intake x 2 recorded meals  Medications reviewed and include: Ensure Enlive BID, SSI, MVI, Miralax, Senokot-S  Labs reviewed.   NUTRITION - FOCUSED PHYSICAL EXAM:    Most Recent Value  Orbital Region  No depletion  Upper Arm Region  No depletion  Thoracic and Lumbar Region  No depletion  Buccal Region  No depletion  Temple Region  No depletion  Clavicle Bone Region  No depletion  Clavicle and Acromion Bone Region  No depletion  Scapular Bone Region  No depletion  Dorsal Hand   No depletion  Patellar Region  No depletion  Anterior Thigh Region  No depletion  Posterior Calf Region  No depletion  Edema (RD Assessment)  None  Hair  Reviewed  Eyes  Reviewed  Mouth  Reviewed  Skin  Reviewed  Nails  Reviewed       Diet Order:   Diet Order            Diet heart healthy/carb modified Room service appropriate? Yes; Fluid consistency: Thin  Diet effective now              EDUCATION NEEDS:   No education needs have been identified at this time  Skin:  Skin Assessment: Reviewed RN Assessment  Last BM:  3/4  Height:   Ht Readings from Last 1 Encounters:  10/06/19 5\' 7"  (1.702 m)    Weight:   Wt Readings from Last 6 Encounters:  10/06/19 84 kg  09/30/19 83.5 kg  04/26/19 86.2 kg  03/17/19 86.2 kg  03/10/19 86.2 kg  10/20/17 86.2 kg    BMI:  Body mass index is 29 kg/m.  Estimated Nutritional Needs:   Kcal:  1700-1900  Protein:  85-95 grams  Fluid:  >/= 1.7L/d    Larkin Ina, MS, RD, LDN RD pager number and weekend/on-call pager number located in [place].

## 2019-10-07 NOTE — Care Management (Signed)
Inpatient [facility] Individual Statement of Services  Patient Name:  Alison Whitehead  Date:  10/07/2019  Welcome to the [facility].  Our goal is to provide you with an individualized program based on your diagnosis and situation, designed to meet your specific needs.  With this comprehensive rehabilitation program, you will be expected to participate in at least 3 hours of rehabilitation therapies Monday-Friday, with modified therapy programming on the weekends.  Your rehabilitation program will include the following services:  Physical Therapy (PT), Occupational Therapy (OT), Speech Therapy (ST), 24 hour per day rehabilitation nursing, Therapeutic Recreaction (TR), Psychology, Neuropsychology, Case Management (Social Worker), Rehabilitation Medicine, Nutrition Services, Pharmacy Services and Other  Weekly team conferences will be held on Tuesdays to discuss your progress.  Your Social Worker will talk with you frequently to get your input and to update you on team discussions.  Team conferences with you and your family in attendance may also be held.  Expected length of stay: 12-15 days   Overall anticipated outcome: Supervision  Depending on your progress and recovery, your program may change. Your Social Worker will coordinate services and will keep you informed of any changes. Your Social Worker's name and contact numbers are listed  below.  The following services may also be recommended but are not provided by the [facility] will be made to provide these services after discharge if needed.  Arrangements include referral to agencies that provide these services.  Your insurance has been verified to be:  [employer]  Your primary doctor is:  [facility]  Pertinent information  will be shared with your doctor and your insurance company.  Social Worker: Loralee Pacas, LCSWA  Information discussed with and copy given to patient by: Rana Snare, 10/07/2019, 2:30 PM

## 2019-10-07 NOTE — Evaluation (Signed)
Occupational Therapy Assessment and Plan  Patient Details  Name: Alison Whitehead MRN: [id number] Date of Birth: 07/15/1951  OT Diagnosis: abnormal posture, acute pain, cognitive deficits and muscle weakness (generalized) Rehab Potential:   ELOS: 10-12   Today's Date: 10/07/2019 OT Individual Time: 2707-8675 OT Individual Time Calculation (min): 60 min     Problem List:  Patient Active Problem List   Diagnosis Date Noted  . Right pontine stroke ([place]) 10/06/2019  . Acute cerebrovascular accident (CVA) ([employer]) 09/30/2019  . Unilateral primary osteoarthritis, left knee 03/17/2019  . Unilateral primary osteoarthritis, right knee 03/17/2019  . Bilateral primary osteoarthritis of knee 03/10/2019  . INSOMNIA, CHRONIC 05/19/2007  . SPONDYLOSIS, LUMBAR 05/19/2007  . Essential hypertension 05/03/2007  . ARTHRITIS, KNEE 05/03/2007    Past Medical History:  Past Medical History:  Diagnosis Date  . Arthritis   . DDD (degenerative disc disease)   . Degenerative disc disease, lumbar   . Hypertension   . IBS (irritable bowel syndrome)   . Osteopenia    Past Surgical History:  Past Surgical History:  Procedure Laterality Date  . SHOULDER SURGERY      Assessment & Plan Clinical Impression:  Alison Whitehead is a 69 year old female with history of HTN--no meds X months, OA bilateral knees, cocaine abuse who was admitted on 10/01/19 with onset of weakness and dizziness followed by nausea and vomiting. UDS positive for opiates and cocaine. BP markedly elevated at 183/120.  MRI of brain done revealing acute/early subacute microhemorrhage with hemorrhagic lacunar infarct in right pons, advanced chronic small vessel disease and multiple supratentorial chronic microhemorrhages question due to sequela of hypertensive microangiopathy.  CT abdomen pelvis showed enlarged fibroid uterus with fairly extensive diverticulosis.  2D echo showed EF of 55 to 60% with severe dilatation of left ventricle  and mild dilatation of ascending aorta-4.3 cm.  Carotid Dopplers were negative for significant ICA stenosis.  Stroke was felt to be secondary to small vessel disease and low-dose aspirin recommended for secondary stroke prevention.  BP medications have been titrated for better blood pressure control.  She continues to have limitations due to headaches, nausea with vestibular symptoms, poor safety awareness, deficits in problem solving as well as OA bilateral knees.  CIR was recommended  Patient currently requires min with basic self-care skills secondary to muscle weakness, decreased cardiorespiratoy endurance, decreased coordination, decreased attention, decreased awareness, decreased problem solving, decreased safety awareness, decreased memory and delayed processing and decreased sitting balance, decreased standing balance, decreased postural control and decreased balance strategies.  Prior to hospitalization, patient could complete BADL/IADL with independent .  Patient will benefit from skilled intervention to decrease level of assist with basic self-care skills and increase independence with basic self-care skills prior to discharge home with care partner.  Anticipate patient will require 24 hour supervision and follow up home health.  OT - End of Session Activity Tolerance: Tolerates 30+ min activity with multiple rests Endurance Deficit: Yes OT Assessment Rehab Potential (ACUTE ONLY): Good OT Barriers to Discharge: Decreased caregiver support;Lack of/limited family support OT Patient demonstrates impairments in the following area(s): Balance;Cognition;Endurance;Motor;Pain;Safety OT Basic ADL's Functional Problem(s): Grooming;Bathing;Dressing;Toileting OT Transfers Functional Problem(s): Toilet;Tub/Shower OT Plan OT Intensity: Minimum of 1-2 x/day, 45 to 90 minutes OT Frequency: 5 out of 7 days OT Duration/Estimated Length of Stay: 10-12 OT Treatment/Interventions: Balance/vestibular  training;Discharge planning;Pain management;Self Care/advanced ADL retraining;Therapeutic Activities;UE/LE Coordination activities;Visual/perceptual remediation/compensation;Therapeutic Exercise;Skin care/wound managment;Patient/family education;Functional mobility training;Disease mangement/prevention;Cognitive remediation/compensation;Community reintegration;DME/adaptive equipment instruction;Neuromuscular re-education;Psychosocial support;Splinting/orthotics;UE/LE Strength taining/ROM;Wheelchair propulsion/positioning OT Self Feeding  Anticipated Outcome(s): MOD I OT Basic Self-Care Anticipated Outcome(s): MOD I groomin; S dressing OT Toileting Anticipated Outcome(s): S OT Bathroom Transfers Anticipated Outcome(s): S OT Recommendation Patient destination: Home Follow Up Recommendations: Home health OT Equipment Recommended: Tub/shower bench Equipment Details: daughter confirming bathroom setup in new apartment   Skilled Therapeutic Intervention 1;1. Pt and daughter present for session. Edu re OT role/purpose, CIR, ELOS, CVA recovery, safety awareness, likely need for 24/7 supervision at d/c, and DME. Pt completes BADLs as stated below at overall MIN A level sit to stand with RW or sink with max VC for safe hand placement with poor carryover throughout session. Pt completes toileting with MIN A but demo impulsivity attempting to stand without OT there despite edu to not get up by herself. Exited session with pt seated in bed, exit alarm on and call light in reach  OT Evaluation Precautions/Restrictions  Precautions Precautions: Fall;Other (comment) Precaution Comments: watch BP (MD parameters <140/80 and was orthostatic at eval); can be impulsive Restrictions Weight Bearing Restrictions: No General   Vital Signs Therapy Vitals Patient Position (if appropriate): Orthostatic Vitals Pain Pain Assessment Pain Scale: Faces Pain Score: 0-No pain Faces Pain Scale: No hurt Patients Stated  Pain Goal: 0 Multiple Pain Sites: No Home Living/Prior Functioning Home Living Family/patient expects to be discharged to:: Private residence Living Arrangements: Alone Available Help at Discharge: Family, Available PRN/intermittently(available intermittently) Type of Home: Apartment Home Access: Level entry Home Layout: One level Bathroom Shower/Tub: Chiropodist: Standard  Lives With: Alone Prior Function Level of Independence: Independent with gait, Independent with homemaking with ambulation, Independent with transfers, Independent with basic ADLs  Able to Take Stairs?: Yes Driving: Yes Vocation: Other (Comment) 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, chartered Comments: Works as caregiver ADL ADL Eating: Set up Grooming: Minimal assistance(standing) Upper Body Bathing: Supervision/safety Where Assessed-Upper Body Bathing: Sitting at sink, Wheelchair Lower Body Bathing: Minimal assistance Where Assessed-Lower Body Bathing: Wheelchair, Sitting at sink Upper Body Dressing: Supervision/safety Where Assessed-Upper Body Dressing: Sitting at sink, Wheelchair Lower Body Dressing: Minimal assistance Where Assessed-Lower Body Dressing: Sitting at sink, Standing at sink, Wheelchair Toileting: Minimal assistance Where Assessed-Toileting: Bedside Commode Toilet Transfer: Minimal assistance Toilet Transfer Method: Insurance claims handler Equipment: Bedside commode(over toileti wiht RW) Vision Baseline Vision/History: Wears glasses Wears Glasses: At all times Additional Comments: difficult to assess due to cognition- inconsistent cue following. Seemed WNL with limited screen at eval but needs further assess. Perception  Perception: Within Functional Limits Praxis Praxis: Intact Cognition Overall Cognitive Status: Impaired/Different from baseline Arousal/Alertness: Lethargic Memory: Impaired Memory Impairment: Decreased short term memory;Retrieval deficit;Storage  deficit Decreased Short Term Memory: Verbal basic;Functional basic Memory Recall Sock: Not able to recall Memory Recall Blue: Not able to recall Memory Recall Bed: Not able to recall Attention: Focused Focused Attention: Impaired Focused Attention Impairment: Verbal basic;Functional basic Sustained Attention: Impaired Sustained Attention Impairment: Verbal basic;Functional basic Awareness: Impaired Awareness Impairment: Intellectual impairment Problem Solving: Impaired Problem Solving Impairment: Verbal basic;Functional basic Executive Function: Reasoning;Decision Making Reasoning: Impaired Reasoning Impairment: Functional basic;Verbal basic Self Monitoring: Impaired Self Monitoring Impairment: Verbal basic Behaviors: Impulsive Safety/Judgment: Impaired Sensation Sensation Light Touch: Appears Intact Hot/Cold: Not tested Proprioception: Impaired by gross assessment Stereognosis: Not tested Coordination Gross Motor Movements are Fluid and Coordinated: No Fine Motor Movements are Fluid and Coordinated: No Coordination and Movement Description: mild ataxia noted with mobility and RAM testing Finger Nose Finger Test: very mild ataxia R>L Heel Shin Test: very mild ataxia R>L Motor  Motor Motor: Ataxia  Motor - Skilled Clinical Observations: very mild ataxia noted with gross movements Mobility  Bed Mobility Bed Mobility: Supine to Sit;Sit to Supine;Scooting to HOB Supine to Sit: Contact Guard/Touching assist Sit to Supine: Contact Guard/Touching assist Scooting to [facility]: Supervision/Verbal Cueing Transfers Sit to Stand: Moderate Assistance - Patient 50-74% Stand to Sit: Moderate Assistance - Patient 50-74%  Trunk/Postural Assessment  Cervical Assessment Cervical Assessment: Exceptions to WFL(forward head) Thoracic Assessment Thoracic Assessment: Exceptions to WFL(slightly kyphotic) Lumbar Assessment Lumbar Assessment: (excessive lordosis) Postural Control Postural  Control: Deficits on evaluation  Balance Balance Balance Assessed: Yes Static Sitting Balance Static Sitting - Level of Assistance: 5: Stand by assistance Dynamic Sitting Balance Dynamic Sitting - Level of Assistance: 4: Min assist Static Standing Balance Static Standing - Level of Assistance: 4: Min assist Dynamic Standing Balance Dynamic Standing - Level of Assistance: 3: Mod assist Extremity/Trunk Assessment BUE generalized weakness and decreased coordination      Refer to Care Plan for Long Term Goals  Recommendations for other services: Neuropsych and Therapeutic Recreation  Stress management   Discharge Criteria: Patient will be discharged from OT if patient refuses treatment 3 consecutive times without medical reason, if treatment goals not met, if there is a change in medical status, if patient makes no progress towards goals or if patient is discharged from hospital.  The above assessment, treatment plan, treatment alternatives and goals were discussed and mutually agreed upon: by patient  Tonny Branch 10/07/2019, 12:58 PM

## 2019-10-07 NOTE — Progress Notes (Signed)
Inpatient Rehabilitation  Patient information reviewed and entered into eRehab system by Brach Birdsall M. [name], M.A., CCC/SLP, PPS Coordinator.  Information including medical coding, functional ability and quality indicators will be reviewed and updated through discharge.    

## 2019-10-07 NOTE — Evaluation (Signed)
Speech Language Pathology Assessment and Plan  Patient Details  Name: Alison Whitehead MRN: [id number] Date of Birth: February 22, 1951  SLP Diagnosis: Cognitive Impairments  Rehab Potential: Good ELOS: 2-3 weeks    Today's Date: 10/07/2019 SLP Individual Time: 6789-3810 SLP Individual Time Calculation (min): 53 min   Problem List:  Patient Active Problem List   Diagnosis Date Noted  . Right pontine stroke ([place]) 10/06/2019  . Acute cerebrovascular accident (CVA) ([place]) 09/30/2019  . Unilateral primary osteoarthritis, left knee 03/17/2019  . Unilateral primary osteoarthritis, right knee 03/17/2019  . Bilateral primary osteoarthritis of knee 03/10/2019  . INSOMNIA, CHRONIC 05/19/2007  . SPONDYLOSIS, LUMBAR 05/19/2007  . Essential hypertension 05/03/2007  . ARTHRITIS, KNEE 05/03/2007   Past Medical History:  Past Medical History:  Diagnosis Date  . Arthritis   . DDD (degenerative disc disease)   . Degenerative disc disease, lumbar   . Hypertension   . IBS (irritable bowel syndrome)   . Osteopenia    Past Surgical History:  Past Surgical History:  Procedure Laterality Date  . SHOULDER SURGERY      Assessment / Plan / Recommendation Clinical Impression   HPI:  Alison Whitehead is a 69 year old female with history of HTN--no meds X months, OA bilateral knees, cocaine abuse who was admitted on 10/01/19 with onset of weakness and dizziness followed by nausea and vomiting. UDS positive for opiates and cocaine. BP markedly elevated at 183/120.  MRI of brain done revealing acute/early subacute microhemorrhage with hemorrhagic lacunar infarct in right pons, advanced chronic small vessel disease and multiple supratentorial chronic microhemorrhages question due to sequela of hypertensive microangiopathy.  CT abdomen pelvis showed enlarged fibroid uterus with fairly extensive diverticulosis.  2D echo showed EF of 55 to 60% with severe dilatation of left ventricle and mild dilatation of  ascending aorta-4.3 cm.  Carotid Dopplers were negative for significant ICA stenosis.  Stroke was felt to be secondary to small vessel disease and low-dose aspirin recommended for secondary stroke prevention.  BP medications have been titrated for better blood pressure control.  She continues to have limitations due to headaches, nausea with vestibular symptoms, poor safety awareness, deficits in problem solving as well as OA bilateral knees.  CIR was recommended due to functional decline - she was admitted to CIR 10/06/19 and SLP evaluation was completed 10/07/19 with results as follows:    Per chart review, pt had bedside swallow evaluation with swallow function determined [facility] and with recommendations for regular/thin diet and no follow up tx indicated. Per pt and medical team report, pt has been tolerating current reg/thin diet, therefore no swallow evaluation was conducted today.   Cognitive-linguistic Evaluation: Pt's speech was fully intelligible and expressive/receptive language [facility]. However, pt presents with moderate cognitive impairments primarily impacting problem solving, short term and working memory, selective attention and intellectual awareness. Pt scored 14/30 on the [facility] version 7.1, which would be indicative of severe impairment according to standardized scoring scale, however subjectively noted by SLP to likely have more of a moderate functional impact on daily living. Pt was somewhat lethargic throughout evaluation, which may have also influenced her performance. Pt with no awareness of either physical or cognitive deficits, or reasoning behind need to call for assistance to get out of bed at the hospital, which is a safety concern/fall risk. Although pt reported she would like to return to living alone at home, anticipate she will need 24/7 supervision at discharge due to severity of cognitive impairments.   Recommend pt receive  skilled ST services to address above listed cognitive  impairments in order to maximize functional independence and safety upon her d/c home.      Skilled Therapeutic Interventions          Cognitive-linguistic evaluation was administered and results were reviewed with pt (see above for details).   SLP Assessment  Patient will need skilled [facility] Services during CIR admission    Recommendations  Oral Care Recommendations: Oral care BID Patient destination: Home Follow up Recommendations: Other (comment)(TBD depending on progress) Equipment Recommended: None recommended by SLP    SLP Frequency 3 to 5 out of 7 days   SLP Duration  SLP Intensity  SLP Treatment/Interventions    Minumum of 1-2 x/day, 30 to 90 minutes  Cognitive remediation/compensation;Internal/external aids;Cueing hierarchy;Functional tasks;Patient/family education;Therapeutic Activities    Pain Pain Assessment Pain Scale: 0-10 Pain Score: 0-No pain      SLP Evaluation Cognition Overall Cognitive Status: Impaired/Different from baseline Arousal/Alertness: Lethargic Orientation Level: Oriented X4 Attention: Sustained Sustained Attention: Impaired Sustained Attention Impairment: Verbal basic;Functional basic Memory: Impaired Memory Impairment: Decreased short term memory;Retrieval deficit;Storage deficit Decreased Short Term Memory: Verbal basic;Functional basic Awareness: Impaired Awareness Impairment: Intellectual impairment Problem Solving: Impaired Problem Solving Impairment: Verbal basic;Functional basic Executive Function: Reasoning;Decision Making Reasoning: Impaired Reasoning Impairment: Functional basic;Verbal basic Self Monitoring: Impaired Self Monitoring Impairment: Verbal basic Behaviors: Impulsive Safety/Judgment: Impaired  Comprehension Auditory Comprehension Overall Auditory Comprehension: Appears within functional limits for tasks assessed Yes/No Questions: Within Functional Limits Commands: Within Functional  Limits Reading Comprehension Reading Status: Not tested Expression Expression Primary Mode of Expression: Verbal Verbal Expression Overall Verbal Expression: Appears within functional limits for tasks assessed Initiation: No impairment Repetition: No impairment Naming: No impairment Pragmatics: No impairment Non-Verbal Means of Communication: Not applicable Written Expression Written Expression: Not tested Oral Motor Oral Motor/Sensory Function Overall Oral Motor/Sensory Function: Within functional limits Motor Speech Overall Motor Speech: Appears within functional limits for tasks assessed Respiration: Within functional limits Phonation: Hoarse Resonance: Within functional limits Articulation: Within functional limitis Intelligibility: Intelligible Motor Planning: Witnin functional limits Motor Speech Errors: Not applicable    Intelligibility: Intelligible   Short Term Goals: Week 1: SLP Short Term Goal 1 (Week 1): Pt will demonstrate ability to selectively attend to functional tasks for 15 minute intervals with Min A cues for redirection. SLP Short Term Goal 2 (Week 1): Pt will demonstrate ability to problem solving during semi-complex to complex tasks with Min A verbal/visual cues. SLP Short Term Goal 3 (Week 1): Pt will demonstrate ability to recall new and daily information with Min A for use of compensatory memory strategies. SLP Short Term Goal 4 (Week 1): Pt will demonstrate organizational strategies during functional tasks with Min A verbal/visual cues. SLP Short Term Goal 5 (Week 1): Pt will demonstrate intellectual awareness by stating at least 1 physical and 1 cognitive impairment with Mod A verbal/visual cues.  Refer to Care Plan for Long Term Goals  Recommendations for other services: Neuropsych  Discharge Criteria: Patient will be discharged from SLP if patient refuses treatment 3 consecutive times without medical reason, if treatment goals not met, if there  is a change in medical status, if patient makes no progress towards goals or if patient is discharged from hospital.  The above assessment, treatment plan, treatment alternatives and goals were discussed and mutually agreed upon: by patient  Arbutus Leas 10/07/2019, 8:30 AM

## 2019-10-07 NOTE — IPOC Note (Signed)
Overall Plan of Care [facility]) Patient Details Name: Alison Whitehead MRN: [id number] DOB: 07-21-1951  Admitting Diagnosis: Right pontine stroke [facility])  Hospital Problems: Principal Problem:   Right pontine stroke [facility])     Functional Problem List: Nursing Endurance, Medication Management, Motor, Perception, Safety  PT Balance, Perception, Safety, Endurance, Motor  OT Balance, Cognition, Endurance, Motor, Pain, Safety  SLP Cognition, Safety, Perception  TR         Basic ADL's: OT Grooming, Bathing, Dressing, Toileting     Advanced  ADL's: OT       Transfers: PT Bed Mobility, Bed to Chair, Car, Furniture, Floor  OT Toilet, Tub/Shower     Locomotion: PT Ambulation, Stairs, Emergency planning/management officer     Additional Impairments: OT    SLP Communication, Social Cognition   Problem Solving, Memory, Attention, Awareness  TR      Anticipated Outcomes Item Anticipated Outcome  Self Feeding MOD I  Swallowing      Basic self-care  MOD I groomin; S dressing  Toileting  S   Bathroom Transfers S  Bowel/Bladder  mod I  Transfers  S, LRAD  Locomotion  S, LRAD  Communication     Cognition  Supervision A  Pain  pain less than 2  Safety/Judgment  mod I   Therapy Plan: PT Intensity: Minimum of 1-2 x/day ,45 to 90 minutes PT Frequency: 5 out of 7 days PT Duration Estimated Length of Stay: 12-15 days OT Intensity: Minimum of 1-2 x/day, 45 to 90 minutes OT Frequency: 5 out of 7 days OT Duration/Estimated Length of Stay: 10-12 SLP Intensity: Minumum of 1-2 x/day, 30 to 90 minutes SLP Frequency: 3 to 5 out of 7 days SLP Duration/Estimated Length of Stay: 2-3 weeks   Due to the current state of emergency, patients may not be receiving their 3-hours of Medicare-mandated therapy.   Team Interventions: Nursing Interventions Other (comment)  PT interventions Ambulation/gait training, Community reintegration, DME/adaptive equipment instruction, Neuromuscular re-education,  Psychosocial support, Stair training, UE/LE Strength taining/ROM, Wheelchair propulsion/positioning, Training and development officer, Discharge planning, Functional electrical stimulation, Pain management, Skin care/wound management, Therapeutic Activities, UE/LE Coordination activities, Cognitive remediation/compensation, Disease management/prevention, Functional mobility training, Patient/family education, Splinting/orthotics, Therapeutic Exercise, Visual/perceptual remediation/compensation  OT Interventions Balance/vestibular training, Discharge planning, Pain management, Self Care/advanced ADL retraining, Therapeutic Activities, UE/LE Coordination activities, Visual/perceptual remediation/compensation, Therapeutic Exercise, Skin care/wound managment, Patient/family education, Functional mobility training, Disease mangement/prevention, Cognitive remediation/compensation, Community reintegration, Engineer, drilling, Neuromuscular re-education, Psychosocial support, Splinting/orthotics, UE/LE Strength taining/ROM, Wheelchair propulsion/positioning  SLP Interventions Cognitive remediation/compensation, Internal/external aids, Cueing hierarchy, Functional tasks, Patient/family education, Therapeutic Activities  TR Interventions    SW/CM Interventions Discharge Planning, Psychosocial Support, Patient/Family Education   Barriers to Discharge MD  Medical stability  Nursing Other (comments)    PT Decreased caregiver support, Medical stability, Lack of/limited family support per available info, family only available PRN  OT Decreased caregiver support, Lack of/limited family support    SLP      SW Lack of/limited family support, Decreased caregiver support Pt dtr works during the day and can only provide intermittent support   Team Discharge Planning: Destination: PT-Home ,OT- Home , SLP-Home Projected Follow-up: PT-Home health PT, 24 hour supervision/assistance, OT-  Home health OT,  SLP-Other (comment)(TBD depending on progress) Projected Equipment Needs: PT-3 in 1 bedside comode, Rolling walker with 5" wheels, Tub/shower seat, OT- Tub/shower bench, SLP-None recommended by SLP Equipment Details: PT- , OT-daughter confirming bathroom setup in new apartment Patient/family involved in discharge planning: PT- Patient unable/family or caregiver  not available(patient too lethargic to proces info),  OT-Patient, SLP-Patient  MD ELOS: 12-16 days Medical Rehab Prognosis:  Excellent Assessment: The patient has been admitted for CIR therapies with the diagnosis of right pontine infarct. The team will be addressing functional mobility, strength, stamina, balance, safety, adaptive techniques and equipment, self-care, bowel and bladder mgt, patient and caregiver education, NMR, cognition, communication, community reentry. Goals have been set at supervision to mod I with mobiltiy and self-care and supervision with cognition.   Due to the current state of emergency, patients may not be receiving their 3 hours per day of Medicare-mandated therapy.    Meredith Staggers, MD, FAAPMR      See Team Conference Notes for weekly updates to the plan of care

## 2019-10-07 NOTE — Progress Notes (Signed)
Social Work Assessment and Plan   Patient Details  Name: Alison Whitehead MRN: [id number] Date of Birth: [birth year]  Today's Date: 10/07/2019  Problem List:  Patient Active Problem List   Diagnosis Date Noted  . Right pontine stroke ([place]) 10/06/2019  . Acute cerebrovascular accident (CVA) ([place]) 09/30/2019  . Unilateral primary osteoarthritis, left knee 03/17/2019  . Unilateral primary osteoarthritis, right knee 03/17/2019  . Bilateral primary osteoarthritis of knee 03/10/2019  . INSOMNIA, CHRONIC 05/19/2007  . SPONDYLOSIS, LUMBAR 05/19/2007  . Essential hypertension 05/03/2007  . ARTHRITIS, KNEE 05/03/2007   Past Medical History:  Past Medical History:  Diagnosis Date  . Arthritis   . DDD (degenerative disc disease)   . Degenerative disc disease, lumbar   . Hypertension   . IBS (irritable bowel syndrome)   . Osteopenia    Past Surgical History:  Past Surgical History:  Procedure Laterality Date  . SHOULDER SURGERY     Social History:  reports that she has been smoking. She has never used smokeless tobacco. She reports that she does not drink alcohol or use drugs.  Family / Support Systems Marital Status: Widow/Widower Patient Roles: Parent Children: 2 adult children. Alison Whitehead [phone] Other Supports: N/A Anticipated Caregiver: Pt reports she will be discharging to home. She states she will have intermittent support Caregiver Availability: Intermittent Family Dynamics: Pt lives alone. pt has two adult children. 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 Read: Yes Write: Yes Employment Status: Retired Public relations account executive Issues: Denies Guardian/Conservator: N/A   Abuse/Neglect Abuse/Neglect Assessment Can Be Completed: Yes Physical Abuse:  Denies Verbal Abuse: Denies Sexual Abuse: Denies Exploitation of patient/patient's resources: Denies Self-Neglect: Denies  Emotional Status Pt's affect, behavior and adjustment status: Pt appears to be accepting of current condition Recent Psychosocial Issues: Denies Psychiatric History: Denies Substance Abuse History: Pt denies use despite EMR  Patient / Family Perceptions, Expectations & Goals Pt/Family understanding of illness & functional limitations: Pt has a general understanding of her medical condition Premorbid pt/family roles/activities: Pt was independent with ADLs/IADLs Anticipated changes in roles/activities/participation: Pt may require assistance at d/c  [employer]: None Premorbid Home Care/DME Agencies: None Transportation available at discharge: TBD which family member will Scientist, clinical (histocompatibility and immunogenetics) referrals recommended: Neuropsychology  Discharge Planning Living Arrangements: Alone Support Systems: Children, Other relatives Type of Residence: Private residence Insurance Resources: Multimedia programmer (specify)([employer] Medicare) Financial Resources: SSI Financial Screen Referred: No Living Expenses: Rent Money Management: Patient Does the patient have any problems obtaining your medications?: No Sw Barriers to Discharge: Lack of/limited family support, Decreased caregiver support Sw Barriers to Discharge Comments: Pt dtr works during the day and can only provide intermittent support Social Work Anticipated Follow Up Needs: HH/OP  Clinical Impression SW met with pt in room to introduce self, explain role, and discuss discharge process.   Alison Whitehead 10/07/2019, 4:44 PM

## 2019-10-07 NOTE — Progress Notes (Signed)
Physical Therapy Session Note  Patient Details  Name: Prestina Raigoza MRN: [id number] Date of Birth: Nov 29, 1950  Today's Date: 10/07/2019 PT Individual Time: 8638-1771 PT Individual Time Calculation (min): 27 min   Short Term Goals: Week 1:  PT Short Term Goal 1 (Week 1): Will be able to transfer with LRAD and MinA PT Short Term Goal 2 (Week 1): Will tolerate gait training at least 157f with min guard and LRAD PT Short Term Goal 3 (Week 1): Will be able to maintain dynamic balance with MinA with no UE support  Skilled Therapeutic Interventions/Progress Updates:   Patient received in bed, sleeping but easily woken and reports feeling much better/much less groggy now. BP taken and in vitals section/below. Continues to require min guard for bed mobility and ModA with RW for transfers, also continues to demonstrate processing and safety deficits and needs cues to reduce impulsivity. Tolerated gait training approximately 334fwith RW and MinA but still somewhat limited due to malaise today. Left in bed with all needs met, bed alarm active.   Supine 117/75 HR 63  Seated after toileting (urgently needed to use commode) 114/85 HR 74  Standing 110/79 HR 79, unable to tolerate standing for 3 minutes  Post-gait in room 119/74 HR 65  Therapy Documentation Precautions:  Precautions Precautions: Fall, Other (comment) Precaution Comments: watch BP (MD parameters <140/80 and was orthostatic at eval); can be impulsive Restrictions Weight Bearing Restrictions: No    Therapy/Group: Individual Therapy   KrWindell NorfolkDPT, PN1   Supplemental Physical Therapist [place]  Pager [phone] Office [phone]  10/07/2019, 12:23 PM

## 2019-10-07 NOTE — Progress Notes (Signed)
[place] PHYSICAL MEDICINE & REHABILITATION PROGRESS NOTE   Subjective/Complaints: Had a pretty good night. Denies pain. Just finished first session with SLP  ROS: Patient denies fever, rash, sore throat, blurred vision, nausea, vomiting, diarrhea, cough, shortness of breath or chest pain, joint or back pain, headache, or mood change.    Objective:   No results found. Recent Labs    10/07/19 0614  WBC 5.5  HGB 13.6  HCT 44.1  PLT 270   Recent Labs    10/06/19 0239 10/07/19 0614  NA 136 136  K 4.8 4.6  CL 99 99  CO2 23 26  GLUCOSE 111* 119*  BUN 24* 28*  CREATININE 1.21* 1.26*  CALCIUM 8.8* 9.3    Intake/Output Summary (Last 24 hours) at 10/07/2019 1036 Last data filed at 10/06/2019 1856 Gross per 24 hour  Intake 240 ml  Output --  Net 240 ml     Physical Exam: Vital Signs Blood pressure (!) 111/92, pulse 86, temperature 97.6 F (36.4 C), temperature source Oral, resp. rate 17, height 5\' 7"  (1.702 m), weight 84 kg, SpO2 96 %.  Constitutional: No distress . Vital signs reviewed. HEENT: EOMI, oral membranes moist Neck: supple Cardiovascular: RRR without murmur. No JVD    Respiratory/Chest: CTA Bilaterally without wheezes or rales. Normal effort    GI/Abdomen: BS +, non-tender, non-distended Ext: no clubbing, cyanosis, or edema Musculoskeletal:        General: No deformity or edema.     Cervical back: Normal range of motion and neck supple.  Neuro: A and O to person and hospital. Follows basic commands, limited insight and awareness.. No focal CN findings.  RUE  5-/5 in deltoid, biceps, triceps, WE,  grip and finger abd. LUE 4 to 4+/5 delt,bic,tricep, wrist and hand. No PD RLE and LLE 4+ to 5/5 in HF, KE, KF, DF and PF senssed pain and light touch in all r's Musc: normal ROM, no   back pain, knees sl tender with PROM Psych: pleasant and cooperative   Assessment/Plan: 1. Functional deficits secondary to right posterior pontine infarct which require 3+ hours  per day of interdisciplinary therapy in a comprehensive inpatient rehab setting.  Physiatrist is providing close team supervision and 24 hour management of active medical problems listed below.  Physiatrist and rehab team continue to assess barriers to discharge/monitor patient progress toward functional and medical goals  Care Tool:  Bathing              Bathing assist       Upper Body Dressing/Undressing Upper body dressing        Upper body assist      Lower Body Dressing/Undressing Lower body dressing            Lower body assist       Toileting Toileting Toileting Activity did not occur (Clothing management and hygiene only): N/A (no void or bm)  Toileting assist       Transfers Chair/bed transfer  Transfers assist     Chair/bed transfer assist level: Moderate Assistance - Patient 50 - 74%     Locomotion Ambulation   Ambulation assist   Ambulation activity did not occur: Safety/medical concerns(orthostatic)          Walk 10 feet activity   Assist  Walk 10 feet activity did not occur: Safety/medical concerns(orthostatic)        Walk 50 feet activity   Assist Walk 50 feet with 2 turns activity did not occur: Safety/medical concerns(orthostatic)  Walk 150 feet activity   Assist Walk 150 feet activity did not occur: Safety/medical concerns(orthostatic)         Walk 10 feet on uneven surface  activity   Assist Walk 10 feet on uneven surfaces activity did not occur: Safety/medical concerns(orthostatic)         Wheelchair     Assist Will patient use wheelchair at discharge?: No      Wheelchair assist level: Moderate Assistance - Patient 50 - 74% Max wheelchair distance: 10ft    Wheelchair 50 feet with 2 turns activity    Assist    Wheelchair 50 feet with 2 turns activity did not occur: Safety/medical concerns(orthostatic)       Wheelchair 150 feet activity     Assist  Wheelchair 150  feet activity did not occur: Safety/medical concerns(orthostatic)       Blood pressure (!) 111/92, pulse 86, temperature 97.6 F (36.4 C), temperature source Oral, resp. rate 17, height 5\' 7"  (1.702 m), weight 84 kg, SpO2 96 %.  Medical Problem List and Plan: 1.  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  Antithrombotics: -DVT/anticoagulation:  Pharmaceutical: Lovenox             -antiplatelet therapy: ASA 3. Persistent HA/Pain Management: Will change oxycodone to ultram prn. Add topamax to treat HA.             3/5-denied facial pain or headache this morning.  -continue with above regimen   4. Mood: LCSW to follow for evaluation and support.              -antipsychotic agents: N/A 5. Neuropsych: This patient is not capable of making decisions on her own behalf. 6. Skin/Wound Care: Routine pressure relief measures.  7. Fluids/Electrolytes/Nutrition: encourage po  3/5 I personally reviewed the patient's labs today.  Still dry by labs (see below) 8. HTN: Monitor BP tid--continue Norvasc, HCTZ, coreg bid, Bidil, lisinopril, Zestril. Has not taken medications for months. Will need to monitor for hypotension as medications get to steady state.             -need to keep BP <140/80 per Neurology   3/5 bp in range today 9. OA bilateral knees: continue Voltaren gel to knees qid. Last used hydrocodone 11/20. Avoid narcotics due to ongoing polysubstance abuse.  10. AKI: Likely due to HCTZ and lisinopril. Will monitor lytes with serial checks.    -3/5 BUN/Cr sl worse today   -already received hctz today---hold moving forward for now   -encourage fluids   -recheck labs tomorrow 11. Prediabetes: Hgb A1c- 6.2. Will monitor BS ac/hs and have RD  educate on CM diet.    -CBG's well controlled at present  LOS: 1 days A FACE TO [place] 10/07/2019, 10:36 AM

## 2019-10-07 NOTE — Evaluation (Signed)
Physical Therapy Assessment and Plan  Patient Details  Name: Alison Whitehead MRN: [id number] Date of Birth: Mar 01, 1951  PT Diagnosis: Abnormality of gait, Ataxic gait, Cognitive deficits, Coordination disorder, Difficulty walking, Impaired cognition and Muscle weakness Rehab Potential: Good ELOS: 12-15 days   Today's Date: 10/07/2019 PT Individual Time: 4098-1191 PT Individual Time Calculation (min): 47 min    Problem List:  Patient Active Problem List   Diagnosis Date Noted  . Right pontine stroke ([place]) 10/06/2019  . Acute cerebrovascular accident (CVA) ([place]) 09/30/2019  . Unilateral primary osteoarthritis, left knee 03/17/2019  . Unilateral primary osteoarthritis, right knee 03/17/2019  . Bilateral primary osteoarthritis of knee 03/10/2019  . INSOMNIA, CHRONIC 05/19/2007  . SPONDYLOSIS, LUMBAR 05/19/2007  . Essential hypertension 05/03/2007  . ARTHRITIS, KNEE 05/03/2007    Past Medical History:  Past Medical History:  Diagnosis Date  . Arthritis   . DDD (degenerative disc disease)   . Degenerative disc disease, lumbar   . Hypertension   . IBS (irritable bowel syndrome)   . Osteopenia    Past Surgical History:  Past Surgical History:  Procedure Laterality Date  . SHOULDER SURGERY      Assessment & Plan Clinical Impression: Alison Whitehead is a 69 year old female with history of HTN--no meds X months, OA bilateral knees, cocaine abuse who was admitted on 10/01/19 with onset of weakness and dizziness followed by nausea and vomiting. UDS positive for opiates and cocaine. BP markedly elevated at 183/120.  MRI of brain done revealing acute/early subacute microhemorrhage with hemorrhagic lacunar infarct in right pons, advanced chronic small vessel disease and multiple supratentorial chronic microhemorrhages question due to sequela of hypertensive microangiopathy.  CT abdomen pelvis showed enlarged fibroid uterus with fairly extensive diverticulosis.  2D echo showed EF  of 55 to 60% with severe dilatation of left ventricle and mild dilatation of ascending aorta-4.3 cm.  Carotid Dopplers were negative for significant ICA stenosis.  Stroke was felt to be secondary to small vessel disease and low-dose aspirin recommended for secondary stroke prevention.  BP medications have been titrated for better blood pressure control.  She continues to have limitations due to headaches, nausea with vestibular symptoms, poor safety awareness, deficits in problem solving as well as OA bilateral knees.  CIR was recommended due to functional decline.  Patient currently requires S for bed mobility and Mod assist for transfers  with mobility secondary to muscle weakness, ataxia, decreased coordination and decreased motor planning, decreased attention to left and decreased motor planning, decreased initiation, decreased attention, decreased awareness, decreased problem solving, decreased safety awareness, decreased memory and delayed processing and decreased sitting balance, decreased standing balance and decreased balance strategies.  Prior to hospitalization, patient was independent  with mobility and lived with Alone in a [place] home.  Home access is  Level entry.  Patient will benefit from skilled PT intervention to maximize safe functional mobility, minimize fall risk and decrease caregiver burden for planned discharge home with 24 hour supervision.  Anticipate patient will benefit from follow up [place] at discharge.  PT - End of Session Activity Tolerance: Decreased this session Endurance Deficit: Yes PT Assessment Rehab Potential (ACUTE/IP ONLY): Good PT Barriers to Discharge: Decreased caregiver support;Medical stability;Lack of/limited family support PT Barriers to Discharge Comments: per available info, family only available PRN PT Patient demonstrates impairments in the following area(s): Balance;Perception;Safety;Endurance;Motor PT Transfers Functional Problem(s): Bed  Mobility;Bed to Chair;Car;Furniture;Floor PT Locomotion Functional Problem(s): Ambulation;Stairs;Wheelchair Mobility PT Plan PT Intensity: Minimum of 1-2 x/day ,45 to 90  minutes PT Frequency: 5 out of 7 days PT Duration Estimated Length of Stay: 12-15 days PT Treatment/Interventions: Ambulation/gait training;Community reintegration;DME/adaptive equipment instruction;Neuromuscular re-education;Psychosocial support;Stair training;UE/LE Strength taining/ROM;Wheelchair propulsion/positioning;Balance/vestibular training;Discharge planning;Functional electrical stimulation;Pain management;Skin care/wound management;Therapeutic Activities;UE/LE Coordination activities;Cognitive remediation/compensation;Disease management/prevention;Functional mobility training;Patient/family education;Splinting/orthotics;Therapeutic Exercise;Visual/perceptual remediation/compensation PT Transfers Anticipated Outcome(s): S, LRAD PT Locomotion Anticipated Outcome(s): S, LRAD PT Recommendation Recommendations for Other Services: Therapeutic Recreation consult Therapeutic Recreation Interventions: Outing/community reintergration;Stress management Follow Up Recommendations: Home health PT;24 hour supervision/assistance Patient destination: Home Equipment Recommended: 3 in 1 bedside comode;Rolling walker with 5" wheels;Tub/shower seat  Skilled Therapeutic Intervention  Patient received in bed, lethargic but able to wake enough to participate in therapy session. A&Ox4 but with significant attention, processing, problem solving, and safety deficits with functional tasks. Generally able to perform bed mobility with min guard, but does currently require ModA with RW due to poor balance and motor planning, weakness, and safety impairments. Able to self-propel WC approximately 24f with significant drift to left- may have some left inattention- then stopped and stated "I'm tired and I really don't feel good". BP checked (see values in  vitals and below), found to be very orthostatic. Eval significantly limited by orthostatics, unable to function gait/stairs/etc.  Returned to supine in her room with no significant improvement in BP- discussed with attending PA. Left in bed with all needs met, bed alarm active and medical team aware of patient status. 28 minutes of skilled PT time missed due to medical complications.   In [place] hallway: BP 84/55 HR 56  Immediately after return to bed: 84/60 HR 56 O2 95% on RA   After 4 minutes in bed: 81/48 HR 59   PT Evaluation Precautions/Restrictions Precautions Precautions: Fall;Other (comment) Precaution Comments: watch BP (MD parameters <140/80 and was orthostatic at eval); can be impulsive Restrictions Weight Bearing Restrictions: No General Chart Reviewed: Yes Response to Previous Treatment: Patient reporting fatigue but able to participate. Family/Caregiver Present: No Vital SignsTherapy Vitals Patient Position (if appropriate): Orthostatic Vitals Pain Pain Assessment Pain Scale: Faces Pain Score: 0-No pain Faces Pain Scale: No hurt Patients Stated Pain Goal: 0 Multiple Pain Sites: No Home Living/Prior Functioning Home Living Available Help at Discharge: Family;Available PRN/intermittently(available intermittently) Type of Home: Apartment Home Access: Level entry Home Layout: One level Bathroom Shower/Tub: TChiropodist Standard  Lives With: Alone Prior Function Level of Independence: Independent with gait;Independent with homemaking with ambulation;Independent with transfers;Independent with basic ADLs  Able to Take Stairs?: Yes Driving: Yes Vocation: Other (Comment) 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 Seemed WNL with limited screen at eval but needs further assess.  Cognition Overall  Cognitive Status: Impaired/Different from baseline Arousal/Alertness: Lethargic Orientation Level: Oriented X4 Attention: Focused Focused Attention: Impaired Focused Attention Impairment: Verbal basic;Functional basic Sustained Attention: Impaired Sustained Attention Impairment: Verbal basic;Functional basic Memory: Impaired Memory Impairment: Decreased short term memory;Retrieval deficit;Storage deficit Decreased Short Term Memory: Verbal basic;Functional basic Memory Recall Sock: Not able to recall Memory Recall Blue: Not able to recall Memory Recall Bed: Not able to recall Awareness: Impaired Awareness Impairment: Intellectual impairment Problem Solving: Impaired Problem Solving Impairment: Verbal basic;Functional basic Executive Function: Reasoning;Decision Making Reasoning: Impaired Reasoning Impairment: Functional basic;Verbal basic Self Monitoring: Impaired Self Monitoring Impairment: Verbal basic Behaviors: Impulsive Safety/Judgment: Impaired Sensation Sensation Light Touch: Appears Intact Hot/Cold: Not tested Proprioception: Impaired by gross assessment Stereognosis: Not tested Coordination Gross Motor Movements are Fluid  and Coordinated: No Fine Motor Movements are Fluid and Coordinated: No Coordination and Movement Description: mild ataxia noted with mobility and RAM testing Finger Nose Finger Test: very mild ataxia R>L Heel Shin Test: very mild ataxia R>L Motor  Motor Motor: Ataxia Motor - Skilled Clinical Observations: very mild ataxia noted with gross movements  Mobility Bed Mobility Bed Mobility: Supine to Sit;Sit to Supine;Scooting to HOB Supine to Sit: Contact Guard/Touching assist Sit to Supine: Contact Guard/Touching assist Scooting to [facility]: Supervision/Verbal Cueing Transfers Transfers: Sit to Stand;Stand to Sit;Stand Pivot Transfers Sit to Stand: Moderate Assistance - Patient 50-74% Stand to Sit: Moderate Assistance - Patient 50-74% Stand Pivot  Transfers: Moderate Assistance - Patient 50 - 74% Stand Pivot Transfer Details: Verbal cues for sequencing;Verbal cues for technique;Verbal cues for safe use of DME/AE;Verbal cues for precautions/safety Stand Pivot Transfer Details (indicate cue type and reason): impulsive, needed ModA for balance and safe control of RW Transfer (Assistive device): Rolling walker Locomotion  Gait Ambulation: No Gait Gait: No Stairs / Additional Locomotion Stairs: No Wheelchair Mobility Wheelchair Mobility: Yes Wheelchair Assistance: Maximal Assistance - Patient 25 - 49% Wheelchair Propulsion: Both upper extremities Wheelchair Parts Management: Needs assistance Distance: 65f with drift to L, max cues needed to correct  Trunk/Postural Assessment  Cervical Assessment Cervical Assessment: Exceptions to WFL(forward head) Thoracic Assessment Thoracic Assessment: Exceptions to WFL(slightly kyphotic) Lumbar Assessment Lumbar Assessment: (excessive lordosis) Postural Control Postural Control: Deficits on evaluation  Balance Balance Balance Assessed: Yes Static Sitting Balance Static Sitting - Level of Assistance: 5: Stand by assistance Dynamic Sitting Balance Dynamic Sitting - Level of Assistance: 4: Min assist Static Standing Balance Static Standing - Level of Assistance: 4: Min assist Dynamic Standing Balance Dynamic Standing - Level of Assistance: 3: Mod assist Extremity Assessment  RUE Assessment RUE Assessment: Not tested(defer to OT) LUE Assessment LUE Assessment: Not tested(defer to OT) RLE Assessment RLE Assessment: Exceptions to [facility] Range of Motion (AROM) Comments: AROM appears WNL during functional tasks General Strength Comments: ankle DF 5/5, quads 4+/5, seated hip ABD 4/5, seated hip flexion 3/5 LLE Assessment LLE Assessment: Exceptions to WLah[facility] IncActive Range of Motion (AROM) Comments: AROM appears WNL through gross functional assessment General Strength Comments: ankle DF  5/5, quads 4+/5, seated hip ABD 4/5, seated hip flexion 3/5    Refer to Care Plan for Long Term Goals  Recommendations for other services: Therapeutic Recreation  Stress management and Outing/community reintegration  Discharge Criteria: Patient will be discharged from PT if patient refuses treatment 3 consecutive times without medical reason, if treatment goals not met, if there is a change in medical status, if patient makes no progress towards goals or if patient is discharged from hospital.  The above assessment, treatment plan, treatment alternatives and goals were discussed and mutually agreed upon: No family available/patient unable  KWindell Norfolk DPT, PN1   Supplemental Physical Therapist [place]   Pager [phone] Rehab Office [phone]

## 2019-10-07 NOTE — Progress Notes (Signed)
Patient with significantly low BP this am with PT evaluation SBP 84/55 in wheelchair--> 81/45 after back in bed. BP better later in the day --encourage fluid intake as also dehydrated.  HCTZ was discontinued today.  Will set parameters on Norvasc and change lisinopril to evenings to help spread out medications.

## 2019-10-08 ENCOUNTER — Inpatient Hospital Stay (HOSPITAL_COMMUNITY): Payer: Medicare HMO | Admitting: Speech Pathology

## 2019-10-08 ENCOUNTER — Inpatient Hospital Stay (HOSPITAL_COMMUNITY): Payer: Medicare HMO

## 2019-10-08 ENCOUNTER — Inpatient Hospital Stay (HOSPITAL_COMMUNITY): Payer: Medicare HMO | Admitting: Physical Therapy

## 2019-10-08 LAB — BASIC METABOLIC PANEL
Anion gap: 12 (ref 5–15)
BUN: 28 mg/dL — ABNORMAL HIGH (ref 8–23)
CO2: 26 mmol/L (ref 22–32)
Calcium: 9 mg/dL (ref 8.9–10.3)
Chloride: 97 mmol/L — ABNORMAL LOW (ref 98–111)
Creatinine, Ser: 1.19 mg/dL — ABNORMAL HIGH (ref 0.44–1.00)
GFR calc Af Amer: 55 mL/min — ABNORMAL LOW (ref >60)
GFR calc non Af Amer: 47 mL/min — ABNORMAL LOW (ref >60)
Glucose, Bld: 106 mg/dL — ABNORMAL HIGH (ref 70–99)
Potassium: 3.9 mmol/L (ref 3.5–5.1)
Sodium: 135 mmol/L (ref 135–145)

## 2019-10-08 LAB — GLUCOSE, CAPILLARY
Glucose-Capillary: 88 mg/dL (ref 70–99)
Glucose-Capillary: 104 mg/dL — ABNORMAL HIGH (ref 70–99)
Glucose-Capillary: 126 mg/dL — ABNORMAL HIGH (ref 70–99)
Glucose-Capillary: 109 mg/dL — ABNORMAL HIGH (ref 70–99)

## 2019-10-08 MED ORDER — SCOPOLAMINE 1 MG/3DAYS TD PT72
1.0000 | MEDICATED_PATCH | TRANSDERMAL | Status: DC
  Administered 2019-10-08 – 2019-10-26 (×7): 1.5 mg via TRANSDERMAL
  Filled 2019-10-08 (×7): qty 1

## 2019-10-08 NOTE — Progress Notes (Signed)
Patient refused insulin; lipitor and blood pressure med. Educated.

## 2019-10-08 NOTE — Progress Notes (Signed)
[place] PHYSICAL MEDICINE & REHABILITATION PROGRESS NOTE   Subjective/Complaints: Feeling very nauseous this morning. Denies constipation or pain.  BMP stable.   ROS: Patient denies fever, rash, sore throat, blurred vision, nausea, vomiting, diarrhea, cough, shortness of breath or chest pain, joint or back pain, headache, or mood change.    Objective:   No results found. Recent Labs    10/07/19 0614  WBC 5.5  HGB 13.6  HCT 44.1  PLT 270   Recent Labs    10/07/19 0614 10/08/19 0531  NA 136 135  K 4.6 3.9  CL 99 97*  CO2 26 26  GLUCOSE 119* 106*  BUN 28* 28*  CREATININE 1.26* 1.19*  CALCIUM 9.3 9.0    Intake/Output Summary (Last 24 hours) at 10/08/2019 1537 Last data filed at 10/08/2019 1300 Gross per 24 hour  Intake 240 ml  Output 1 ml  Net 239 ml     Physical Exam: Vital Signs Blood pressure 104/68, pulse 71, temperature (!) 97.5 F (36.4 C), temperature source Oral, resp. rate 19, height 5\' 7"  (1.702 m), weight 84 kg, SpO2 97 %.  Constitutional: Vital signs reviewed. Lying in bed, appears nauseous.  HEENT: EOMI, oral membranes moist Neck: supple Cardiovascular: RRR without murmur. No JVD    Respiratory/Chest: CTA Bilaterally without wheezes or rales. Normal effort    GI/Abdomen: BS +, non-tender, non-distended Ext: no clubbing, cyanosis, or edema Musculoskeletal:        General: No deformity or edema.     Cervical back: Normal range of motion and neck supple.  Neuro: A and O to person and hospital. Follows basic commands, limited insight and awareness.. No focal CN findings.  RUE  5-/5 in deltoid, biceps, triceps, WE,  grip and finger abd. LUE 4 to 4+/5 delt,bic,tricep, wrist and hand. No PD RLE and LLE 4+ to 5/5 in HF, KE, KF, DF and PF senssed pain and light touch in all r's Musc: normal ROM, no   back pain, knees sl tender with PROM Psych: pleasant and cooperative   Assessment/Plan: 1. Functional deficits secondary to right posterior pontine  infarct which require 3+ hours per day of interdisciplinary therapy in a comprehensive inpatient rehab setting.  Physiatrist is providing close team supervision and 24 hour management of active medical problems listed below.  Physiatrist and rehab team continue to assess barriers to discharge/monitor patient progress toward functional and medical goals  Care Tool:  Bathing    Body parts bathed by patient: Right arm, Left arm, Chest, Abdomen, Front perineal area, Buttocks, Right upper leg, Left upper leg, Right lower leg, Left lower leg, Face         Bathing assist Assist Level: Supervision/Verbal cueing(grab bar; mod VC safety awareness)     Upper Body Dressing/Undressing Upper body dressing   What is the patient wearing?: Pull over shirt    Upper body assist Assist Level: Contact Guard/Touching assist(standing)    Lower Body Dressing/Undressing Lower body dressing      What is the patient wearing?: Pants     Lower body assist Assist for lower body dressing: Contact Guard/Touching assist     Toileting Toileting Toileting Activity did not occur (Clothing management and hygiene only): N/A (no void or bm)  Toileting assist Assist for toileting: Contact Guard/Touching assist     Transfers Chair/bed transfer  Transfers assist     Chair/bed transfer assist level: Contact Guard/Touching assist     Locomotion Ambulation   Ambulation assist   Ambulation activity did  not occur: Safety/medical concerns(orthostatic)  Assist level: Contact Guard/Touching assist Assistive device: Walker-rolling Max distance: 150   Walk 10 feet activity   Assist  Walk 10 feet activity did not occur: Safety/medical concerns(orthostatic)  Assist level: Contact Guard/Touching assist Assistive device: Walker-rolling   Walk 50 feet activity   Assist Walk 50 feet with 2 turns activity did not occur: Safety/medical concerns(orthostatic)  Assist level: Contact Guard/Touching  assist Assistive device: Walker-rolling    Walk 150 feet activity   Assist Walk 150 feet activity did not occur: Safety/medical concerns(orthostatic)  Assist level: Contact Guard/Touching assist Assistive device: Walker-rolling    Walk 10 feet on uneven surface  activity   Assist Walk 10 feet on uneven surfaces activity did not occur: Safety/medical concerns(orthostatic)         Wheelchair     Assist Will patient use wheelchair at discharge?: No      Wheelchair assist level: Moderate Assistance - Patient 50 - 74% Max wheelchair distance: 9ft    Wheelchair 50 feet with 2 turns activity    Assist    Wheelchair 50 feet with 2 turns activity did not occur: Safety/medical concerns(orthostatic)       Wheelchair 150 feet activity     Assist  Wheelchair 150 feet activity did not occur: Safety/medical concerns(orthostatic)       Blood pressure 104/68, pulse 71, temperature (!) 97.5 F (36.4 C), temperature source Oral, resp. rate 19, height 5\' 7"  (1.702 m), weight 84 kg, SpO2 97 %.  Medical Problem List and Plan: 1.  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  Antithrombotics: -DVT/anticoagulation:  Pharmaceutical: Lovenox             -antiplatelet therapy: ASA 3. Persistent HA/Pain Management: Will change oxycodone to ultram prn. Add topamax to treat HA.             3/5-denied facial pain or headache this morning.  -continue with above regimen   4. Mood: LCSW to follow for evaluation and support.              -antipsychotic agents: N/A 5. Neuropsych: This patient is not capable of making decisions on her own behalf. 6. Skin/Wound Care: Routine pressure relief measures.  7. Fluids/Electrolytes/Nutrition: encourage po  3/5 I personally  reviewed the patient's labs today.  Still dry by labs (see below) 8. HTN: Monitor BP tid--continue Norvasc, HCTZ, coreg bid, Bidil, lisinopril, Zestril. Has not taken medications for months. Will need to monitor for hypotension as medications get to steady state.             -need to keep BP <140/80 per Neurology   3/5 bp in range today  3/6: well controlled.  9. OA bilateral knees: continue Voltaren gel to knees qid. Last used hydrocodone 11/20. Avoid narcotics due to ongoing polysubstance abuse.  10. AKI: Likely due to HCTZ and lisinopril. Will monitor lytes with serial checks.    -3/5 BUN/Cr sl worse today   -already received hctz today---hold moving forward for now   -encourage fluids   -recheck labs tomorrow 11. Prediabetes: Hgb A1c- 6.2. Will monitor BS ac/hs and have RD educate on CM diet.    -CBG's  continue to be well controlled at present 12. Nausea: added scopolamine patch  LOS: 2 days A FACE TO FACE EVALUATION WAS PERFORMED  Clide Deutscher Maruice Pieroni 10/08/2019, 3:37 PM

## 2019-10-08 NOTE — Progress Notes (Signed)
Physical Therapy Session Note  Patient Details  Name: Alison Whitehead MRN: [id number] Date of Birth: 1950/10/01  Today's Date: 10/08/2019 PT Individual Time: [id number] PT Individual Time Calculation (min): 70 min   Short Term Goals: Week 1:  PT Short Term Goal 1 (Week 1): Will be able to transfer with LRAD and MinA PT Short Term Goal 2 (Week 1): Will tolerate gait training at least 162ft with min guard and LRAD PT Short Term Goal 3 (Week 1): Will be able to maintain dynamic balance with MinA with no UE support  Skilled Therapeutic Interventions/Progress Updates:  Pt was seen bedside in the pm. BP supine in bed 107/71, HR 59. Pt transferred to edge of bed with S. BP on edge of bed 125/84 with HR 71. Pt then transferred to standing with min A and verbal cues. Pt's BP in standing was 113/93 with HR 81. Pt returned to edge of bed. BP was 132/85 with HR 70. Pt transferred to stand with c/g and verbal cues with rolling walker. Pt ambulated about 30 feet with rolling walker and c/g. Pt performed toilet transfers with c/g and verbal cues. Pt returned to edge of bed. BP on edge of bed was 128/90 with HR 70. Pt ambulated 125 feet with rolling walker and c.g. Pt's BP after walking was 114/75 with HR 88. In gym pt performed multiple sit to stand with c/g and verbal cues. Pt performed step taps 3 sets x 10 reps each. Pt ambulated 150 feet and 125 feet with rolling walker and c/g. Pt returned to room and transferred to supine with S. Pt's BP in bed was 104/78 with HR 75. Pt left sitting up in bed with call bell within reach and bed alarm on.   Therapy Documentation Precautions:  Precautions Precautions: Fall, Other (comment) Precaution Comments: watch BP (MD parameters <140/80 and was orthostatic at eval); can be impulsive Restrictions Weight Bearing Restrictions: No General:   Vital Signs: Therapy Vitals BP: 104/68(per PT) Patient Position (if appropriate): Sitting Pain: No c/o pain.    Therapy/Group: Individual Therapy  Dub Amis 10/08/2019, 3:36 PM

## 2019-10-08 NOTE — Progress Notes (Signed)
Patient's topical voltaren gel scanned and applied to patient's knees bilaterally. However MAR still indicates overdue. Pharmacy made aware.

## 2019-10-08 NOTE — Progress Notes (Signed)
Occupational Therapy Session Note  Patient Details  Name: Alison Whitehead MRN: [id number] Date of Birth: 30-Jul-1951  Today's Date: 10/08/2019 OT Individual Time: 0700-0757 OT Individual Time Calculation (min): 57 min    Short Term Goals: Week 1:  OT Short Term Goal 1 (Week 1): Pt will transfer to toilet wiht CGA OT Short Term Goal 2 (Week 1): Pt will groomin in standing wiht S OT Short Term Goal 3 (Week 1): Pt will complete toileting with CGA  Skilled Therapeutic Interventions/Progress Updates:    1;1. Pt received in bed agreeabe to bathing/dressing/groomin at shower and sink level. No pain reported initialy however HA developed over time of session. RN alerted. Pt competed all transfers at ambulatory level and RW. BP 122/77 at beginning of session. Pt completes toieting with CGA, and attempted to stand without A demoing poor awareness and insight to safety/balance deficits. Pt bathes with S using grab bars and MAX VC for use of seated strategies to wash feet and rinse with HH shower head as pt attempted to stand on 1 foot and spin around to rinse bottom. Pt dresses sit to stand with CGA standing shirt donning and pants at sit to stand level. Socks donned with set up. 132/75 BP after ADL session. A provided for shaving corners of mouth. Pt reporting nausea and requesting to end session early and lay down. Pt given emesis bag and RN alerted to nausea as well. Exited session with pt missing 18 min skilled OT with exit alarm on and call light in reach  Therapy Documentation Precautions:  Precautions Precautions: Fall, Other (comment) Precaution Comments: watch BP (MD parameters <140/80 and was orthostatic at eval); can be impulsive Restrictions Weight Bearing Restrictions: No General:   Vital Signs: Therapy Vitals Temp: (!) 97.5 F (36.4 C) Temp Source: Oral Pulse Rate: 71 Resp: 19 BP: 97/69 Patient Position (if appropriate): Lying Oxygen Therapy SpO2: 97 % O2 Device: Room  Air Pain:   ADL: ADL Eating: Set up Grooming: Minimal assistance(standing) Upper Body Bathing: Supervision/safety Where Assessed-Upper Body Bathing: Sitting at sink, Wheelchair Lower Body Bathing: Minimal assistance Where Assessed-Lower Body Bathing: Wheelchair, Sitting at sink Upper Body Dressing: Supervision/safety Where Assessed-Upper Body Dressing: Sitting at sink, Wheelchair Lower Body Dressing: Minimal assistance Where Assessed-Lower Body Dressing: Sitting at sink, Standing at sink, Wheelchair Toileting: Minimal assistance Where Assessed-Toileting: Bedside Commode Toilet Transfer: Minimal assistance Toilet Transfer Method: Insurance claims handler Equipment: Bedside commode(over toileti wiht RW) Vision   Perception    Praxis   Exercises:   Other Treatments:     Therapy/Group: Individual Therapy  Tonny Branch 10/08/2019, 7:07 AM

## 2019-10-08 NOTE — Progress Notes (Signed)
Speech Language Pathology Daily Session Note  Patient Details  Name: Cheryse Oriordan MRN: [id number] Date of Birth: 01-18-1951  Today's Date: 10/08/2019 SLP Individual Time: 1530-1600 SLP Individual Time Calculation (min): 30 min  Short Term Goals: Week 1: SLP Short Term Goal 1 (Week 1): Pt will demonstrate ability to selectively attend to functional tasks for 15 minute intervals with Min A cues for redirection. SLP Short Term Goal 2 (Week 1): Pt will demonstrate ability to problem solving during semi-complex to complex tasks with Min A verbal/visual cues. SLP Short Term Goal 3 (Week 1): Pt will demonstrate ability to recall new and daily information with Min A for use of compensatory memory strategies. SLP Short Term Goal 4 (Week 1): Pt will demonstrate organizational strategies during functional tasks with Min A verbal/visual cues. SLP Short Term Goal 5 (Week 1): Pt will demonstrate intellectual awareness by stating at least 1 physical and 1 cognitive impairment with Mod A verbal/visual cues.  Skilled Therapeutic Interventions:  Pt was seen for skilled ST targeting cognitive goals.  Upon arrival, pt was trying to order her dinner.  Pt needed overall supervision cues for thought organization to make appropriate meal selections in light of her current heart healthy diet restrictions.  Pt denies any cognitive or physical changes post CVA and states she isn't quite "what's expected of her" on rehab.  Pt was unable to come up with any goals for herself on rehab and states that she feels she is close to her normal.   Pt was left in bed with bed alarm set and call bell within reach. Continue per current plan of care.    Pain Pain Assessment Pain Scale: 0-10 Pain Score: 0-No pain  Therapy/Group: Individual Therapy  Lukis Bunt, Selinda Orion 10/08/2019, 4:38 PM

## 2019-10-09 ENCOUNTER — Inpatient Hospital Stay (HOSPITAL_COMMUNITY): Payer: Medicare HMO | Admitting: Physical Therapy

## 2019-10-09 ENCOUNTER — Inpatient Hospital Stay (HOSPITAL_COMMUNITY): Payer: Medicare HMO

## 2019-10-09 LAB — GLUCOSE, CAPILLARY
Glucose-Capillary: 97 mg/dL (ref 70–99)
Glucose-Capillary: 114 mg/dL — ABNORMAL HIGH (ref 70–99)
Glucose-Capillary: 104 mg/dL — ABNORMAL HIGH (ref 70–99)
Glucose-Capillary: 136 mg/dL — ABNORMAL HIGH (ref 70–99)

## 2019-10-09 MED ORDER — AMLODIPINE BESYLATE 5 MG PO TABS: 5.0000 mg | ORAL_TABLET | Freq: Every day | ORAL | Status: DC

## 2019-10-09 NOTE — Progress Notes (Signed)
[facility] PHYSICAL MEDICINE & REHABILITATION PROGRESS NOTE   Subjective/Complaints: Nausea improved. Still appears fatigued.   ROS: Patient denies fever, rash, sore throat, blurred vision, nausea, vomiting, diarrhea, cough, shortness of breath or chest pain, joint or back pain, headache, or mood change.    Objective:   No results found. Recent Labs    10/07/19 0614  WBC 5.5  HGB 13.6  HCT 44.1  PLT 270   Recent Labs    10/07/19 0614 10/08/19 0531  NA 136 135  K 4.6 3.9  CL 99 97*  CO2 26 26  GLUCOSE 119* 106*  BUN 28* 28*  CREATININE 1.26* 1.19*  CALCIUM 9.3 9.0    Intake/Output Summary (Last 24 hours) at 10/09/2019 1227 Last data filed at 10/09/2019 0900 Gross per 24 hour  Intake 240 ml  Output 1 ml  Net 239 ml     Physical Exam: Vital Signs Blood pressure (!) 119/97, pulse 79, temperature 98.2 F (36.8 C), resp. rate 19, height 5\' 7"  (1.702 m), weight 84 kg, SpO2 97 %.  Constitutional: Vital signs reviewed. Lying in bed, appears more comfortable. HEENT: EOMI, oral membranes moist Neck: supple Cardiovascular: RRR without murmur. No JVD    Respiratory/Chest: CTA Bilaterally without wheezes or rales. Normal effort    GI/Abdomen: BS +, non-tender, non-distended Ext: no clubbing, cyanosis, or edema Musculoskeletal:        General: No deformity or edema.     Cervical back: Normal range of motion and neck supple.  Neuro: A and O to person and hospital. Follows basic commands, limited insight and awareness.. No focal CN findings.  RUE  5-/5 in deltoid, biceps, triceps, WE,  grip and finger abd. LUE 4 to 4+/5 delt,bic,tricep, wrist and hand. No PD RLE and LLE 4+ to 5/5 in HF, KE, KF, DF and PF senssed pain and light touch in all r's Musc: normal ROM, no   back pain, knees sl tender with PROM Psych: pleasant and cooperative   Assessment/Plan: 1. Functional deficits secondary to right posterior pontine infarct which require 3+ hours per day of interdisciplinary  therapy in a comprehensive inpatient rehab setting.  Physiatrist is providing close team supervision and 24 hour management of active medical problems listed below.  Physiatrist and rehab team continue to assess barriers to discharge/monitor patient progress toward functional and medical goals  Care Tool:  Bathing    Body parts bathed by patient: Right arm, Left arm, Chest, Abdomen, Front perineal area, Buttocks, Right upper leg, Left upper leg, Right lower leg, Left lower leg, Face         Bathing assist Assist Level: Supervision/Verbal cueing(grab bar; mod VC safety awareness)     Upper Body Dressing/Undressing Upper body dressing   What is the patient wearing?: Pull over shirt    Upper body assist Assist Level: Contact Guard/Touching assist(standing)    Lower Body Dressing/Undressing Lower body dressing      What is the patient wearing?: Pants     Lower body assist Assist for lower body dressing: Contact Guard/Touching assist     Toileting Toileting Toileting Activity did not occur (Clothing management and hygiene only): N/A (no void or bm)  Toileting assist Assist for toileting: Contact Guard/Touching assist     Transfers Chair/bed transfer  Transfers assist     Chair/bed transfer assist level: Contact Guard/Touching assist     Locomotion Ambulation   Ambulation assist   Ambulation activity did not occur: Safety/medical concerns(orthostatic)  Assist level: Contact Guard/Touching assist  Assistive device: Walker-rolling Max distance: 150   Walk 10 feet activity   Assist  Walk 10 feet activity did not occur: Safety/medical concerns(orthostatic)  Assist level: Contact Guard/Touching assist Assistive device: Walker-rolling   Walk 50 feet activity   Assist Walk 50 feet with 2 turns activity did not occur: Safety/medical concerns(orthostatic)  Assist level: Contact Guard/Touching assist Assistive device: Walker-rolling    Walk 150 feet  activity   Assist Walk 150 feet activity did not occur: Safety/medical concerns(orthostatic)  Assist level: Contact Guard/Touching assist Assistive device: Walker-rolling    Walk 10 feet on uneven surface  activity   Assist Walk 10 feet on uneven surfaces activity did not occur: Safety/medical concerns(orthostatic)         Wheelchair     Assist Will patient use wheelchair at discharge?: No      Wheelchair assist level: Moderate Assistance - Patient 50 - 74% Max wheelchair distance: 43ft    Wheelchair 50 feet with 2 turns activity    Assist    Wheelchair 50 feet with 2 turns activity did not occur: Safety/medical concerns(orthostatic)       Wheelchair 150 feet activity     Assist  Wheelchair 150 feet activity did not occur: Safety/medical concerns(orthostatic)       Blood pressure (!) 119/97, pulse 79, temperature 98.2 F (36.8 C), resp. rate 19, height 5\' 7"  (1.702 m), weight 84 kg, SpO2 97 %.  Medical Problem List and Plan: 1.  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  Antithrombotics: -DVT/anticoagulation:  Pharmaceutical: Lovenox             -antiplatelet therapy: ASA 3. Persistent HA/Pain Management: Will change oxycodone to ultram prn. Add topamax to treat HA.              3/5-denied facial pain or headache this morning.  3/7: denies headache.   -continue with above regimen   4. Mood: LCSW to follow for evaluation and support.              -antipsychotic agents: N/A 5. Neuropsych: This patient is not capable of making decisions on her own behalf. 6. Skin/Wound Care: Routine pressure relief measures.  7. Fluids/Electrolytes/Nutrition: encourage po  3/5 I personally reviewed the patient's labs today.  Still dry by labs (see  below) 8. HTN: Monitor BP tid--continue Norvasc, HCTZ, coreg bid, Bidil, lisinopril, Zestril. Has not taken medications for months. Will need to monitor for hypotension as medications get to steady state.             -need to keep BP <140/80 per Neurology   3/5 bp in range today  3/6: well controlled.   3/7: well controlled, on the soft side for last several reads. Decrease amlodipine to 5mg .  9. OA bilateral knees: continue Voltaren gel to knees qid. Last used hydrocodone 11/20. Avoid narcotics due to ongoing polysubstance abuse.  10. AKI: Likely due to HCTZ and lisinopril. Will monitor lytes with serial checks.    -3/5 BUN/Cr sl worse today   -already received hctz today---hold moving forward for now   -encourage fluids   -recheck labs tomorrow 11. Prediabetes: Hgb A1c- 6.2. Will monitor BS ac/hs and  have RD educate on CM diet.    -CBG's continue to be well controlled at present 12. Nausea: added scopolamine patch, better controlled.   LOS: 3 days A FACE TO FACE EVALUATION WAS PERFORMED  Clide Deutscher Mischa Pollard 10/09/2019, 12:27 PM

## 2019-10-09 NOTE — Plan of Care (Signed)
  Problem: RH BOWEL ELIMINATION Goal: RH STG MANAGE BOWEL WITH ASSISTANCE Description: STG Manage Bowel with  moderate Assistance. Outcome: Not Progressing; patient has small bowel movement; does not like the food here

## 2019-10-09 NOTE — Progress Notes (Signed)
Occupational Therapy Session Note  Patient Details  Name: Alison Whitehead MRN: [id number] Date of Birth: 1950/11/01  Today's Date: 10/09/2019 OT Individual Time: [id number] OT Individual Time Calculation (min): 55 min    Short Term Goals: Week 1:  OT Short Term Goal 1 (Week 1): Pt will transfer to toilet wiht CGA OT Short Term Goal 2 (Week 1): Pt will groomin in standing wiht S OT Short Term Goal 3 (Week 1): Pt will complete toileting with CGA  Skilled Therapeutic Interventions/Progress Updates:    Pt asleep in bed upon arrival but easily aroused. Pt declined bathing and changing clothing this morning stating that her daughter hasn't brought clean clothing in. Pt agreeable to getting OOB for therapy.  No reports of dizziness or lightheadedness during session. BP-116/70, 114/87, and 123/85 throughout session.  Pt transitioned to gym and engaged in standing tasks at table including assembling puzzle (required min verbal cues to assist with puzzle), peg board pattern, and checkers.  Sit<>stand and standing balance with CGA.  Min verbal cues for sequencing and safety.  Pt states her allergies are "acting up" and c/o slight HA. Pt returned to room and requested to return to bed.  Tranfsers with CGA. Pt remained in bed with all needs within reach and bed alarm activated.   Therapy Documentation Precautions:  Precautions Precautions: Fall, Other (comment) Precaution Comments: watch BP (MD parameters <140/80 and was orthostatic at eval); can be impulsive Restrictions Weight Bearing Restrictions: No Pain:  Pt denies pain this morning   Therapy/Group: Individual Therapy  Leroy Libman 10/09/2019, 8:59 AM

## 2019-10-09 NOTE — Progress Notes (Signed)
Daughter was here and she took patients credit cards at home.

## 2019-10-09 NOTE — Progress Notes (Signed)
Per report patient has credit cards (10) in her room and was advised by CN to tell patient to let designated visitor to bring it home. RN explained to patient  to let somebody pick up her cards. Patient got mad at RN claims " I am responsible for my cards" . After an hour patient apologized to RN and said that she will let somebody come pick up her cards. Relayed message to CN.

## 2019-10-09 NOTE — Progress Notes (Signed)
Physical Therapy Session Note  Patient Details  Name: Alison Whitehead MRN: [id number] Date of Birth: [birth year]  Today's Date: 10/09/2019 PT Individual Time: [id number] PT Individual Time Calculation (min): 30 min   Short Term Goals: Week 1:  PT Short Term Goal 1 (Week 1): Will be able to transfer with LRAD and MinA PT Short Term Goal 2 (Week 1): Will tolerate gait training at least 162ft with min guard and LRAD PT Short Term Goal 3 (Week 1): Will be able to maintain dynamic balance with MinA with no UE support  Skilled Therapeutic Interventions/Progress Updates:  Pt was seen bedside in the pm. Pt performed bed mobility with S and side rails. Pt performed sit to stand transfers with S and rolling walker with verbal cues. Pt performed stand pivot transfers with rolling walker and c/g with verbal cues. Pt ambulated 125 feet x 2 and 50 feet x 2 with rolling walker and c/g. Pt ascended/descended 4 stairs with B rails and c/g. Upon returning to room, pt c/o dizziness. Pt returned to supine in bed, pt's BP 92/51 with HR 83. Notified pt's nurse. Pt left sitting up in bed with call bell within reach and bed alarm on.   Therapy Documentation Precautions:  Precautions Precautions: Fall, Other (comment) Precaution Comments: watch BP (MD parameters <140/80 and was orthostatic at eval); can be impulsive Restrictions Weight Bearing Restrictions: No General:   Pain: No c/o pain.   Therapy/Group: Individual Therapy  Dub Amis 10/09/2019, 3:53 PM

## 2019-10-09 NOTE — Progress Notes (Signed)
MD notified of patient's blood pressure and blood pressure meds. New orders noted

## 2019-10-10 ENCOUNTER — Inpatient Hospital Stay (HOSPITAL_COMMUNITY): Payer: Medicare HMO | Admitting: Speech Pathology

## 2019-10-10 ENCOUNTER — Inpatient Hospital Stay (HOSPITAL_COMMUNITY): Payer: Medicare HMO | Admitting: Physical Therapy

## 2019-10-10 ENCOUNTER — Inpatient Hospital Stay (HOSPITAL_COMMUNITY): Payer: Medicare HMO | Admitting: Occupational Therapy

## 2019-10-10 DIAGNOSIS — R7303 Prediabetes: Secondary | ICD-10-CM

## 2019-10-10 DIAGNOSIS — N179 Acute kidney failure, unspecified: Secondary | ICD-10-CM

## 2019-10-10 DIAGNOSIS — I951 Orthostatic hypotension: Secondary | ICD-10-CM

## 2019-10-10 LAB — BASIC METABOLIC PANEL
Anion gap: 13 (ref 5–15)
BUN: 36 mg/dL — ABNORMAL HIGH (ref 8–23)
CO2: 23 mmol/L (ref 22–32)
Calcium: 9.3 mg/dL (ref 8.9–10.3)
Chloride: 102 mmol/L (ref 98–111)
Creatinine, Ser: 1.42 mg/dL — ABNORMAL HIGH (ref 0.44–1.00)
GFR calc Af Amer: 44 mL/min — ABNORMAL LOW (ref >60)
GFR calc non Af Amer: 38 mL/min — ABNORMAL LOW (ref >60)
Glucose, Bld: 112 mg/dL — ABNORMAL HIGH (ref 70–99)
Potassium: 3.8 mmol/L (ref 3.5–5.1)
Sodium: 138 mmol/L (ref 135–145)

## 2019-10-10 LAB — CBC
HCT: 41.9 % (ref 36.0–46.0)
Hemoglobin: 13.4 g/dL (ref 12.0–15.0)
MCH: 27.6 pg (ref 26.0–34.0)
MCHC: 32 g/dL (ref 30.0–36.0)
MCV: 86.2 fL (ref 80.0–100.0)
Platelets: 240 10*3/uL (ref 150–400)
RBC: 4.86 MIL/uL (ref 3.87–5.11)
RDW: 13.5 % (ref 11.5–15.5)
WBC: 4.4 10*3/uL (ref 4.0–10.5)
nRBC: 0 % (ref 0.0–0.2)

## 2019-10-10 LAB — GLUCOSE, CAPILLARY
Glucose-Capillary: 93 mg/dL (ref 70–99)
Glucose-Capillary: 98 mg/dL (ref 70–99)
Glucose-Capillary: 138 mg/dL — ABNORMAL HIGH (ref 70–99)

## 2019-10-10 MED ORDER — LISINOPRIL 10 MG PO TABS
10.0000 mg | ORAL_TABLET | Freq: Every day | ORAL | Status: DC
  Administered 2019-10-10: 10 mg via ORAL
  Filled 2019-10-10: qty 1

## 2019-10-10 MED ORDER — SODIUM CHLORIDE 0.9 % IV SOLN
INTRAVENOUS | Status: AC
  Administered 2019-10-11: 1000 mL via INTRAVENOUS

## 2019-10-10 NOTE — Progress Notes (Signed)
Pt sat up at edge of bed to eat breakfast, this RN educated patient on either sitting in a chair or fully in the bed as it was safer. Patient refused and stated "well I am sitting myself at the edge of bed not you, so if something happens I wont tell." This RN once more educated patient and patient again refused. Bed alarm set and patient at edge of bed

## 2019-10-10 NOTE — Progress Notes (Signed)
Physical Therapy Session Note  Patient Details  Name: Adaleia Civello MRN: [id number] Date of Birth: 1950/09/02  Today's Date: 10/10/2019 PT Individual Time: [id number] AND 1330-1400 PT Individual Time Calculation (min): 23 min 30 min  Short Term Goals: Week 1:  PT Short Term Goal 1 (Week 1): Will be able to transfer with LRAD and MinA PT Short Term Goal 2 (Week 1): Will tolerate gait training at least 168ft with min guard and LRAD PT Short Term Goal 3 (Week 1): Will be able to maintain dynamic balance with MinA with no UE support  Skilled Therapeutic Interventions/Progress Updates:   Session 1:  Pt in supine and on phone, therapist reported it was time for PT and requested pt end phone call for scheduled therapy session. Pt stated she could not end phone call and asked therapist to return later. Therapist left room to retrieve TED hose, returned 30 sec later and pt sitting EOB, agreeable to therapy and denies pain. Discussed bouts of dizziness over past few days. Pt denies dizziness while seated EOB, BP taken and 91/72. Donned knee-high TEDs in preparation for standing and educated pt on reasoning. Sit>stand w/ CGA and ambulated 20' w/ CGA using RW. Pt reported feeling dizzy at this point, returned to sitting and BP 75/55. Stand pivot back to EOB w/ min assist. Sit>supine w/ supervision. Pt reported feeling slightly better once in supine, however remained dizzy and nauseous, BP 89/60 in supine. Ended session in supine, all needs in reach. RN made aware of status.   Session 2:  Pt in supine, agreeable to therapy and denies pain. Pt appearing in more pleasant and alert mood this afternoon. She is receiving fluids for low BP and reports "I don't remember telling you I was dizzy this AM". Supine>sit w/ supervision. Pt already w/ TEDs donned. Sit<>stand to RW and began ambulating out of room w/ RW, CGA. Pt denied dizziness and was conversing with therapist during gait bout. Ambulated 150' x2 w/ 1  seated rest break 2/2 fatigue, no dizziness/nausea. Discussed d/c plan and receiving help at d/c, pt states he daughter will check in and her boyfriend lives nearby. Anticipate pt will need 24/7 supervision at this time 2/2 cognitive deficits but will continue to assess and work towards safest d/c to home. BP taken once returned to room and seated in w/c, 109/69. Re-taken after 5 min, BP 98/75 and pt continued to remain asymptomatic. Educated pt on importance of working on upright/OOB tolerance and pt agreeable to sit up for at least part of next therapy session. Ended session in w/c, all needs in reach.   Therapy Documentation Precautions:  Precautions Precautions: Fall, Other (comment) Precaution Comments: watch BP (MD parameters <140/80 and was orthostatic at eval); can be impulsive Restrictions Weight Bearing Restrictions: No  Therapy/Group: Individual Therapy  Keaja Reaume K Trenton Passow 10/10/2019, 9:22 AM

## 2019-10-10 NOTE — Progress Notes (Signed)
[facility] PHYSICAL MEDICINE & REHABILITATION PROGRESS NOTE   Subjective/Complaints: Patient seen laying in bed this morning.  She states she slept well overnight.  She states she did the weekend.  ROS: Denies CP, SOB, N/V/D  Objective:   No results found. Recent Labs    10/10/19 0536  WBC 4.4  HGB 13.4  HCT 41.9  PLT 240   Recent Labs    10/08/19 0531 10/10/19 0536  NA 135 138  K 3.9 3.8  CL 97* 102  CO2 26 23  GLUCOSE 106* 112*  BUN 28* 36*  CREATININE 1.19* 1.42*  CALCIUM 9.0 9.3    Intake/Output Summary (Last 24 hours) at 10/10/2019 1150 Last data filed at 10/10/2019 0845 Gross per 24 hour  Intake 600 ml  Output --  Net 600 ml     Physical Exam: Vital Signs Blood pressure 106/80, pulse 72, temperature (!) 97.3 F (36.3 C), resp. rate 17, height 5\' 7"  (1.702 m), weight 84 kg, SpO2 96 %. Constitutional: No distress . Vital signs reviewed. HENT: Normocephalic.  Atraumatic. Eyes: EOMI. No discharge. Cardiovascular: No JVD. Respiratory: Normal effort.  No stridor. GI: Non-distended. Skin: Warm and dry.  Intact. Psych: Normal mood.  Normal behavior. Musc: No edema in extremities.  No tenderness in extremities. Neuro: Alert Motor: Grossly 4+/5 throughout   Assessment/Plan: 1. Functional deficits secondary to right posterior pontine infarct which require 3+ hours per day of interdisciplinary therapy in a comprehensive inpatient rehab setting.  Physiatrist is providing close team supervision and 24 hour management of active medical problems listed below.  Physiatrist and rehab team continue to assess barriers to discharge/monitor patient progress toward functional and medical goals  Care Tool:  Bathing    Body parts bathed by patient: Right arm, Left arm, Chest, Abdomen, Front perineal area, Buttocks, Right upper leg, Left upper leg, Right lower leg, Left lower leg, Face         Bathing assist Assist Level: Supervision/Verbal cueing(grab bar; mod VC  safety awareness)     Upper Body Dressing/Undressing Upper body dressing   What is the patient wearing?: Pull over shirt    Upper body assist Assist Level: Contact Guard/Touching assist(standing)    Lower Body Dressing/Undressing Lower body dressing      What is the patient wearing?: Pants     Lower body assist Assist for lower body dressing: Contact Guard/Touching assist     Toileting Toileting Toileting Activity did not occur (Clothing management and hygiene only): N/A (no void or bm)  Toileting assist Assist for toileting: Contact Guard/Touching assist(front wheel walker)     Transfers Chair/bed transfer  Transfers assist     Chair/bed transfer assist level: Contact Guard/Touching assist     Locomotion Ambulation   Ambulation assist   Ambulation activity did not occur: Safety/medical concerns(orthostatic)  Assist level: Contact Guard/Touching assist Assistive device: Walker-rolling Max distance: 125   Walk 10 feet activity   Assist  Walk 10 feet activity did not occur: Safety/medical concerns(orthostatic)  Assist level: Contact Guard/Touching assist Assistive device: Walker-rolling   Walk 50 feet activity   Assist Walk 50 feet with 2 turns activity did not occur: Safety/medical concerns(orthostatic)  Assist level: Contact Guard/Touching assist Assistive device: Walker-rolling    Walk 150 feet activity   Assist Walk 150 feet activity did not occur: Safety/medical concerns(orthostatic)  Assist level: Contact Guard/Touching assist Assistive device: Walker-rolling    Walk 10 feet on uneven surface  activity   Assist Walk 10 feet on uneven surfaces  activity did not occur: Safety/medical concerns(orthostatic)         Wheelchair     Assist Will patient use wheelchair at discharge?: No      Wheelchair assist level: Moderate Assistance - Patient 50 - 74% Max wheelchair distance: 36ft    Wheelchair 50 feet with 2 turns  activity    Assist    Wheelchair 50 feet with 2 turns activity did not occur: Safety/medical concerns(orthostatic)       Wheelchair 150 feet activity     Assist  Wheelchair 150 feet activity did not occur: Safety/medical concerns(orthostatic)       Blood pressure 106/80, pulse 72, temperature (!) 97.3 F (36.3 C), resp. rate 17, height 5\' 7"  (1.702 m), weight 84 kg, SpO2 96 %.  Medical Problem List and Plan: 1.  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  Antithrombotics: -DVT/anticoagulation:  Pharmaceutical: Lovenox             -antiplatelet therapy: ASA 3. Persistent HA/Pain Management: Will change oxycodone to ultram prn. Add topamax to treat HA.             Appears controlled with medications on 3/8    Continue with above regimen   4. Mood: LCSW to follow for evaluation and support.              -antipsychotic agents: N/A 5. Neuropsych: This patient is not capable of making decisions on her own behalf. 6. Skin/Wound Care: Routine pressure relief measures.  7. Fluids/Electrolytes/Nutrition: encourage po 8. HTN: Monitor BP   HCTZ, amlodipine DC'd  Norvasc, coreg bid, Bidil, lisinopril, Zestril. Has not taken medications for months. Will need to monitor for hypotension as medications get to steady state.             -need to keep BP <140/80 per Neurology   Lisinopril decreased to 10 on 3/8.   + Orthostasis on 3/8 - ACE wraps/Abd binder as necessary  Encourage fluids 9. OA bilateral knees: continue Voltaren gel to knees qid. Avoid narcotics due to ongoing polysubstance abuse.  10. AKI:   See #8  -encourage fluids  Creatinine 1.42 on 3/8  IVF nightly x2 nights started 3/8, echocardiogram with ejection fraction of 55 to 60% 11. Prediabetes: Hgb A1c- 6.2. Will monitor BS ac/hs and have RD educate on CM diet.    Labile on 3/8, monitor trend 12. Nausea:  scopolamine patch, better controlled.   LOS: 4 days A FACE TO FACE EVALUATION WAS PERFORMED  Alison Whitehead Lorie Phenix 10/10/2019, 11:50 AM

## 2019-10-10 NOTE — Plan of Care (Signed)
  Problem: Consults Goal: RH STROKE PATIENT EDUCATION Description: See Patient Education module for education specifics  Outcome: Progressing Goal: Nutrition Consult-if indicated Outcome: Progressing Goal: Diabetes Guidelines if Diabetic/Glucose > 140 Description: If diabetic or lab glucose is > 140 mg/dl - Initiate Diabetes/Hyperglycemia Guidelines & Document Interventions  Outcome: Progressing   Problem: RH BOWEL ELIMINATION Goal: RH STG MANAGE BOWEL WITH ASSISTANCE Description: STG Manage Bowel with  moderate Assistance. Outcome: Progressing Goal: RH STG MANAGE BOWEL W/MEDICATION W/ASSISTANCE Description: STG Manage Bowel with Medication with moderate Assistance. Outcome: Progressing   Problem: RH SKIN INTEGRITY Goal: RH STG SKIN FREE OF INFECTION/BREAKDOWN Description: Skin free from infection entire stay on rehab Outcome: Progressing Goal: RH STG ABLE TO PERFORM INCISION/WOUND CARE W/ASSISTANCE Description: STG Able To Perform Incision/Wound Care With Assistance. Outcome: Progressing   Problem: RH PAIN MANAGEMENT Goal: RH STG PAIN MANAGED AT OR BELOW PT'S PAIN GOAL Description: Pain less than 2 Outcome: Progressing   Problem: RH KNOWLEDGE DEFICIT Goal: RH STG INCREASE KNOWLEDGE OF HYPERTENSION Description: Moderate assistance Outcome: Progressing   Problem: RH SAFETY Goal: RH STG ADHERE TO SAFETY PRECAUTIONS W/ASSISTANCE/DEVICE Description: STG Adhere to Safety Precautions With  moderate Assistance/Device. Outcome: Progressing

## 2019-10-10 NOTE — Progress Notes (Signed)
Occupational Therapy Session Note  Patient Details  Name: Alison Whitehead MRN: [id number] Date of Birth: 1950-12-09  Today's Date: 10/10/2019 OT Individual Time: 1100-1200 OT Individual Time Calculation (min): 60 min   Short Term Goals: Week 1:  OT Short Term Goal 1 (Week 1): Pt will transfer to toilet wiht CGA OT Short Term Goal 2 (Week 1): Pt will groomin in standing wiht S OT Short Term Goal 3 (Week 1): Pt will complete toileting with CGA  Skilled Therapeutic Interventions/Progress Updates:  Patient met lying supine in bed in agreement with OT treatment session. BP assessed throughout. Please refer to vitals below. 0/10 report of pain at rest and with activity. Patient able to complete supine to EOB transfer with supervision A. Functional mobility to bathroom with use of RW and CGA for safety with patient requiring vc's for proximity to RW. Patient indicated need for MB requiring CGA for toilet transfer to [facility] over toilet. Toileting/hygine/clothing management with CGA and Mod vc's for safety as patient demonstrates decreased safety awareness and impulsivity. Patient Ambulated to sink with RW and CGA. Skilled OT provided for self-care re-education including UB bath/dress with supervision A and LB bath/dress with CGA and vc's for pacing/saety with RW and wc safety for locking breaks. BP assessed with patient seated in wc after bathing with reading of 81/71 but non-symptomatic. Patient requesting to get out of room for a bit. OT provided education on wc mobility secondary to low BP including locking breaks for safety and use of BUE and BLE for self propulsion. Patient self-propelled wc ~110f to and from dayroom with occasional verbal cues for sequencing and quality of movement. Patient returned to supine via stand-pivot transfer with supervision A. Patient left lying supine in bed with call bell within reach, all needs met, and bed alarm activated.  Therapy Documentation Precautions:   Precautions Precautions: Fall, Other (comment) Precaution Comments: watch BP (MD parameters <140/80 and was orthostatic at eval); can be impulsive Restrictions Weight Bearing Restrictions: No  Vital Signs: Supine: BP: 105/62 HR: 74bpm EOB: BP: 120/80 HR: 84bpm Sitting after bathing: 81/71 HR: 105bpm Supine after activity: 101/76 HR: 77bpm  Therapy/Group: Individual Therapy  Bria Sparr R Howerton-Davis 10/10/2019, 7:49 AM

## 2019-10-10 NOTE — Progress Notes (Signed)
Speech Language Pathology Daily Session Note  Patient Details  Name: Alison Whitehead MRN: [id number] Date of Birth: Jan 30, 1951  Today's Date: 10/10/2019 SLP Individual Time: [id number] SLP Individual Time Calculation (min): 55 min  Short Term Goals: Week 1: SLP Short Term Goal 1 (Week 1): Pt will demonstrate ability to selectively attend to functional tasks for 15 minute intervals with Min A cues for redirection. SLP Short Term Goal 2 (Week 1): Pt will demonstrate ability to problem solving during semi-complex to complex tasks with Min A verbal/visual cues. SLP Short Term Goal 3 (Week 1): Pt will demonstrate ability to recall new and daily information with Min A for use of compensatory memory strategies. SLP Short Term Goal 4 (Week 1): Pt will demonstrate organizational strategies during functional tasks with Min A verbal/visual cues. SLP Short Term Goal 5 (Week 1): Pt will demonstrate intellectual awareness by stating at least 1 physical and 1 cognitive impairment with Mod A verbal/visual cues.  Skilled Therapeutic Interventions: Skilled treatment session focused on cognitive goals. SLP facilitated session by providing Mod A verbal cues for recall of her current medications and their functions. Min-Mod A verbal cues were also needed to self-monitor and correct errors during a complex medication management task of organizing a 4x/day pill box. Patient reported fatigue and requested multiple rest breaks during session. Patient left upright in bed with alarm on and all needs within reach. Continue with current plan of care.      Pain No/Denies Pain   Therapy/Group: Individual Therapy  Alison Whitehead 10/10/2019, 3:23 PM

## 2019-10-11 ENCOUNTER — Inpatient Hospital Stay (HOSPITAL_COMMUNITY): Payer: Medicare HMO | Admitting: Occupational Therapy

## 2019-10-11 ENCOUNTER — Encounter (HOSPITAL_COMMUNITY): Payer: Medicare HMO | Admitting: Psychology

## 2019-10-11 ENCOUNTER — Inpatient Hospital Stay (HOSPITAL_COMMUNITY): Payer: Medicare HMO | Admitting: Physical Therapy

## 2019-10-11 ENCOUNTER — Inpatient Hospital Stay (HOSPITAL_COMMUNITY): Payer: Medicare HMO | Admitting: Speech Pathology

## 2019-10-11 DIAGNOSIS — I679 Cerebrovascular disease, unspecified: Secondary | ICD-10-CM

## 2019-10-11 LAB — GLUCOSE, CAPILLARY
Glucose-Capillary: 119 mg/dL — ABNORMAL HIGH (ref 70–99)
Glucose-Capillary: 108 mg/dL — ABNORMAL HIGH (ref 70–99)
Glucose-Capillary: 94 mg/dL (ref 70–99)
Glucose-Capillary: 129 mg/dL — ABNORMAL HIGH (ref 70–99)
Glucose-Capillary: 136 mg/dL — ABNORMAL HIGH (ref 70–99)

## 2019-10-11 MED ORDER — MELATONIN 3 MG PO TABS
3.0000 mg | ORAL_TABLET | Freq: Every evening | ORAL | Status: DC | PRN
  Filled 2019-10-11: qty 1

## 2019-10-11 MED ORDER — NON FORMULARY: 3.0000 mg | Freq: Every evening | Status: DC | PRN

## 2019-10-11 MED ORDER — TOPIRAMATE 25 MG PO TABS
25.0000 mg | ORAL_TABLET | Freq: Every day | ORAL | Status: DC
  Administered 2019-10-11 – 2019-10-14 (×4): 25 mg via ORAL
  Filled 2019-10-11 (×4): qty 1

## 2019-10-11 MED ORDER — LISINOPRIL 5 MG PO TABS
5.0000 mg | ORAL_TABLET | Freq: Every day | ORAL | Status: DC
  Administered 2019-10-11 – 2019-10-18 (×8): 5 mg via ORAL
  Filled 2019-10-11 (×8): qty 1

## 2019-10-11 NOTE — Progress Notes (Signed)
Physical Therapy Session Note  Patient Details  Name: Alison Whitehead MRN: [id number] Date of Birth: 03-May-1951  Today's Date: 10/11/2019 PT Individual Time: 1030-1055 PT Individual Time Calculation (min): 25 min   Short Term Goals: Week 1:  PT Short Term Goal 1 (Week 1): Will be able to transfer with LRAD and MinA PT Short Term Goal 2 (Week 1): Will tolerate gait training at least 129ft with min guard and LRAD PT Short Term Goal 3 (Week 1): Will be able to maintain dynamic balance with MinA with no UE support  Skilled Therapeutic Interventions/Progress Updates:   Pt in supine and agreeable to therapy, denies pain but reports feeling slightly dizzy in supine. Performed orthostatic vitals w/o TEDs as detailed below, pt w/ increased headache and dizziness once in sitting and more so in standing. Supervision bed mobility and CGA sit<>stand to RW. Returned to sitting and then supine. Donned thigh-high TEDs and repeated orthostatic vitals as detailed below. Pt reports she overall feels better with TEDs, but unable to tolerate standing for more than 30 sec 2/2 dizziness/nausea. Returned to supine and pt immediately turned onto her side and started falling asleep. Discussed w/ RN and MD. Pt ended session in supine, all needs in reach. Missed 35 min of skilled PT 2/2 low BP and fatigue.   BP w/o TEDs -supine 106/60  -seated 117/61  -standing 87/63  BP w/ TEDs -supine 97/56 -seated 89/59 -standing 96/47  Therapy Documentation Precautions:  Precautions Precautions: Fall, Other (comment) Precaution Comments: watch BP (MD parameters <140/80 and was orthostatic at eval); can be impulsive Restrictions Weight Bearing Restrictions: No Pain: Pain Assessment Pain Scale: 0-10 Pain Score: 3  Pain Type: Acute pain Pain Location: Head Pain Descriptors / Indicators: Aching Pain Intervention(s): Medication (See eMAR)  Therapy/Group: Individual Therapy  Renuka Farfan K Chirag Krueger 10/11/2019, 11:20 AM

## 2019-10-11 NOTE — Plan of Care (Signed)
  Problem: Consults Goal: RH STROKE PATIENT EDUCATION Description: See Patient Education module for education specifics  Outcome: Progressing Goal: Nutrition Consult-if indicated Outcome: Progressing Goal: Diabetes Guidelines if Diabetic/Glucose > 140 Description: If diabetic or lab glucose is > 140 mg/dl - Initiate Diabetes/Hyperglycemia Guidelines & Document Interventions  Outcome: Progressing   Problem: RH BOWEL ELIMINATION Goal: RH STG MANAGE BOWEL WITH ASSISTANCE Description: STG Manage Bowel with  moderate Assistance. Outcome: Progressing Goal: RH STG MANAGE BOWEL W/MEDICATION W/ASSISTANCE Description: STG Manage Bowel with Medication with moderate Assistance. Outcome: Progressing   Problem: RH SKIN INTEGRITY Goal: RH STG SKIN FREE OF INFECTION/BREAKDOWN Description: Skin free from infection entire stay on rehab Outcome: Progressing Goal: RH STG ABLE TO PERFORM INCISION/WOUND CARE W/ASSISTANCE Description: STG Able To Perform Incision/Wound Care With Assistance. Outcome: Progressing   Problem: RH SAFETY Goal: RH STG ADHERE TO SAFETY PRECAUTIONS W/ASSISTANCE/DEVICE Description: STG Adhere to Safety Precautions With  moderate Assistance/Device. Outcome: Progressing   Problem: RH PAIN MANAGEMENT Goal: RH STG PAIN MANAGED AT OR BELOW PT'S PAIN GOAL Description: Pain less than 2 Outcome: Progressing   Problem: RH KNOWLEDGE DEFICIT Goal: RH STG INCREASE KNOWLEDGE OF HYPERTENSION Description: Moderate assistance Outcome: Progressing

## 2019-10-11 NOTE — Progress Notes (Signed)
Speech Language Pathology Daily Session Note  Patient Details  Name: Alison Whitehead MRN: [id number] Date of Birth: 01/07/1951  Today's Date: 10/11/2019 SLP Individual Time: 0725-0820 SLP Individual Time Calculation (min): 55 min  Short Term Goals: Week 1: SLP Short Term Goal 1 (Week 1): Pt will demonstrate ability to selectively attend to functional tasks for 15 minute intervals with Min A cues for redirection. SLP Short Term Goal 2 (Week 1): Pt will demonstrate ability to problem solving during semi-complex to complex tasks with Min A verbal/visual cues. SLP Short Term Goal 3 (Week 1): Pt will demonstrate ability to recall new and daily information with Min A for use of compensatory memory strategies. SLP Short Term Goal 4 (Week 1): Pt will demonstrate organizational strategies during functional tasks with Min A verbal/visual cues. SLP Short Term Goal 5 (Week 1): Pt will demonstrate intellectual awareness by stating at least 1 physical and 1 cognitive impairment with Mod A verbal/visual cues.  Skilled Therapeutic Interventions: Skilled treatment session focused on cognitive goals. SLP facilitated session by providing extra time and overall supervision level verbal cues for basic problem solving during a money management task. Patient was also overall Mod I for problem solving/reading comprehension with functional reading tasks like medication labels/warnings, etc. Patient demonstrated sustained attention to tasks for ~30 minutes with overall Mod I. Increased cueing was needed for recall of daily information, therefore, a notebook was provided for patient to utilize to maximize recall of daily information. Patient left upright in bed with alarm on and all needs within reach. Continue with current plan of care.      Pain Pain Assessment Pain Scale: 0-10 Pain Score: 3  Pain Type: Acute pain Pain Location: Head Pain Descriptors / Indicators: Aching Pain Intervention(s): Medication (See  eMAR)  Therapy/Group: Individual Therapy  Murel Shenberger 10/11/2019, 10:12 AM

## 2019-10-11 NOTE — Progress Notes (Signed)
Social Work Patient ID: Alison Whitehead, female   DOB: May 08, 1951, 69 y.o.   MRN: [id number]   SW met with pt in room to provide updates from team conference, and anticipated d/c date 10/19/2019. SW discussed with pt needing 24/7care for safety. Pt aware SW will follow-up with her.  SW called pt dtr Hoyle Sauer [phone]) to discuss above. She stated there was no anyone who could provide 24/7 care. When discussing patient's sister as an option. She stated patient's sister is older than her and not physically able to assist pt. SW discussed pt requiring supervision level of care, and 24/7 care due to cognition with function for ADLs/IADLs. SW discussed options for sitter services. She states the concern would be stairs in patient's sisters' home. SW to discuss with team if there are any concerns with patient using stairs. Pt dtr had many questions related to how to improve pt memory if any. SW discussed caregiver education and the benefits. She intends to speak with her aunt and follow-up with SW. SW to leave Medicaid application in pt room.  *Medicaid Application and sitter list left in pt room.   Loralee Pacas, MSW, [place] Office: [phone] Cell: [phone] Fax: [phone]

## 2019-10-11 NOTE — Progress Notes (Signed)
[place] PHYSICAL MEDICINE & REHABILITATION PROGRESS NOTE   Subjective/Complaints: Patient seen laying in bed this morning.  She states she slept well overnight.  Has been hypotensive in therapies. Continues to be nauseous.   ROS: Denies CP, SOB, N/V/D  Objective:   No results found. Recent Labs    10/10/19 0536  WBC 4.4  HGB 13.4  HCT 41.9  PLT 240   Recent Labs    10/10/19 0536  NA 138  K 3.8  CL 102  CO2 23  GLUCOSE 112*  BUN 36*  CREATININE 1.42*  CALCIUM 9.3    Intake/Output Summary (Last 24 hours) at 10/11/2019 1019 Last data filed at 10/10/2019 2013 Gross per 24 hour  Intake 540 ml  Output --  Net 540 ml     Physical Exam: Vital Signs Blood pressure 134/81, pulse 71, temperature 97.8 F (36.6 C), resp. rate 18, height 5\' 7"  (1.702 m), weight 84 kg, SpO2 100 %. Constitutional: No distress . Vital signs reviewed. Lying in bed, appears more comfortable than Saturday.  HENT: Normocephalic.  Atraumatic. Eyes: EOMI. No discharge. Cardiovascular: No JVD. Respiratory: Normal effort.  No stridor. GI: Non-distended. Skin: Warm and dry.  Intact. Psych: Normal mood.  Normal behavior. Musc: No edema in extremities.  No tenderness in extremities. Neuro: Alert Motor: Grossly 4+/5 throughout   Assessment/Plan: 1. Functional deficits secondary to right posterior pontine infarct which require 3+ hours per day of interdisciplinary therapy in a comprehensive inpatient rehab setting.  Physiatrist is providing close team supervision and 24 hour management of active medical problems listed below.  Physiatrist and rehab team continue to assess barriers to discharge/monitor patient progress toward functional and medical goals  Care Tool:  Bathing    Body parts bathed by patient: Right arm, Left arm, Chest, Abdomen, Front perineal area, Buttocks, Right upper leg, Left upper leg, Right lower leg, Left lower leg, Face         Bathing assist Assist Level:  Supervision/Verbal cueing(Seated at sink level with Mod vc's for pacing/safety)     Upper Body Dressing/Undressing Upper body dressing   What is the patient wearing?: Pull over shirt    Upper body assist Assist Level: Supervision/Verbal cueing(Seated)    Lower Body Dressing/Undressing Lower body dressing      What is the patient wearing?: Pants, Underwear/pull up     Lower body assist Assist for lower body dressing: Contact Guard/Touching assist(Mod vc's for safety and activity pacing.)     Toileting Toileting Toileting Activity did not occur (Clothing management and hygiene only): N/A (no void or bm)  Toileting assist Assist for toileting: Contact Guard/Touching assist(With BSC over toilet)     Transfers Chair/bed transfer  Transfers assist     Chair/bed transfer assist level: Minimal Assistance - Patient > 75%     Locomotion Ambulation   Ambulation assist   Ambulation activity did not occur: Safety/medical concerns(orthostatic)  Assist level: Contact Guard/Touching assist Assistive device: Walker-rolling Max distance: 150'   Walk 10 feet activity   Assist  Walk 10 feet activity did not occur: Safety/medical concerns(orthostatic)  Assist level: Contact Guard/Touching assist Assistive device: Walker-rolling   Walk 50 feet activity   Assist Walk 50 feet with 2 turns activity did not occur: Safety/medical concerns(orthostatic)  Assist level: Contact Guard/Touching assist Assistive device: Walker-rolling    Walk 150 feet activity   Assist Walk 150 feet activity did not occur: Safety/medical concerns(orthostatic)  Assist level: Contact Guard/Touching assist Assistive device: Walker-rolling    Walk  10 feet on uneven surface  activity   Assist Walk 10 feet on uneven surfaces activity did not occur: Safety/medical concerns(orthostatic)         Wheelchair     Assist Will patient use wheelchair at discharge?: No      Wheelchair  assist level: Moderate Assistance - Patient 50 - 74% Max wheelchair distance: 45ft    Wheelchair 50 feet with 2 turns activity    Assist    Wheelchair 50 feet with 2 turns activity did not occur: Safety/medical concerns(orthostatic)       Wheelchair 150 feet activity     Assist  Wheelchair 150 feet activity did not occur: Safety/medical concerns(orthostatic)       Blood pressure 134/81, pulse 71, temperature 97.8 F (36.6 C), resp. rate 18, height 5\' 7"  (1.702 m), weight 84 kg, SpO2 100 %.  Medical Problem List and Plan: 1.  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   Team conference today.  2.  Antithrombotics: -DVT/anticoagulation:  Pharmaceutical: Lovenox             -antiplatelet therapy: ASA 3. Persistent HA/Pain Management: Will change oxycodone to ultram prn. Add topamax to treat HA.             Appears controlled with medications on 3/8   3/9: Will start Topiramate 25mg  daily.    Continue with above regimen   4. Mood: LCSW to follow for evaluation and support.              -antipsychotic agents: N/A 5. Neuropsych: This patient is not capable of making decisions on her own behalf. 6. Skin/Wound Care: Routine pressure relief measures.  7. Fluids/Electrolytes/Nutrition: encourage po 8. HTN: Monitor BP   HCTZ, amlodipine DC'd  Norvasc, coreg bid, Bidil, lisinopril, Zestril. Has not taken medications for months. Will need to monitor for hypotension as medications get to steady state.             -need to keep BP <140/80 per Neurology   Lisinopril decreased to 10 on 3/8.   + Orthostasis on 3/8 - ACE wraps/Abd binder as necessary  3/9: lisinopril decreased to 5mg .   Encourage fluids 9. OA bilateral knees: continue Voltaren gel to knees qid. Avoid narcotics due to ongoing polysubstance abuse.  10. AKI:   See #8  -encourage  fluids  Creatinine 1.42 on 3/8  IVF nightly x2 nights started 3/8, echocardiogram with ejection fraction of 55 to 60% 11. Prediabetes: Hgb A1c- 6.2. Will monitor BS ac/hs and have RD educate on CM diet.    Labile on 3/8, monitor trend  3/9: well controlled.  12. Nausea: scopolamine patch, better controlled.   LOS: 5 days A FACE TO FACE EVALUATION WAS PERFORMED  Alison Whitehead 10/11/2019, 10:19 AM

## 2019-10-11 NOTE — Consult Note (Signed)
Neuropsychological Consultation   Patient:   Alison Whitehead   DOB:   1950/09/04  MR Number:  [id number]  Location:  [place] [facility] [place] [id number] [place] [address] Dept: [place]: [phone]           Date of Service:   10/11/2019  Start Time:   1 PM End Time:   2 PM  Provider/Observer:  Ilean Skill, Psy.D.       Clinical Neuropsychologist       Billing Code/Service: [phone]  Chief Complaint:    Alison Whitehead is a 69 year old female with a history of hypertension with no medications for a month, OSA bilateral knees, cocaine abuse.  The patient was admitted on 10/01/2019 with onset of weakness and dizziness followed by nausea and vomiting.  Urine drug screen was positive for opiates (which were prescribed) and cocaine.  Blood pressure markedly elevated at 183/120.  MRI brain done revealing acute/early subacute microhemorrhage with hemorrhagic lacunar infarcts in the right pons, advanced chronic small vessel disease and multiple supratentorial chronic microhemorrhages question due to sequela of hypertensive microangiopathy.  Stroke was felt due to be secondary to small vessel disease and low-dose aspirin was recommended for secondary stroke prevention.  Blood pressure medications have been titrated.  The patient was referred for comprehensive inpatient rehabilitation due to functional decline.  Reason for Service:  The patient was referred for neuropsychological consultation due to coping and adjustment issues.  Below is the HPI for the current admission.  HPI:  Alison Whitehead is a 69 year old female with history of HTN--no meds X months, OA bilateral knees, cocaine abuse who was admitted on 10/01/19 with onset of weakness and dizziness followed by nausea and vomiting. UDS positive for opiates and cocaine. BP markedly elevated at 183/120.  MRI of brain done revealing  acute/early subacute microhemorrhage with hemorrhagic lacunar infarct in right pons, advanced chronic small vessel disease and multiple supratentorial chronic microhemorrhages question due to sequela of hypertensive microangiopathy.  CT abdomen pelvis showed enlarged fibroid uterus with fairly extensive diverticulosis.  2D echo showed EF of 55 to 60% with severe dilatation of left ventricle and mild dilatation of ascending aorta-4.3 cm.  Carotid Dopplers were negative for significant ICA stenosis.  Stroke was felt to be secondary to small vessel disease and low-dose aspirin recommended for secondary stroke prevention.  BP medications have been titrated for better blood pressure control.  She continues to have limitations due to headaches, nausea with vestibular symptoms, poor safety awareness, deficits in problem solving as well as OA bilateral knees.  CIR was recommended due to functional decline.  Current Status:  The patient was well oriented showing good mental status today.  She had questions about the cause of her stroke and whether cocaine played a role in this issue.  We reviewed risk factors for stroke with cocaine in conjunction with hypertension and small vessel disease.  The patient reports that she felt better understand some of the risk of cocaine use and reports that her plans are to completely discontinue its use/abuse.  The patient denied any depressive or anxiety based symptoms and reports that she is coping fairly well with the extended hospital stay and feels like the rehabilitation efforts are going to be good for her and she would benefit from them.  Behavioral Observation: Alison Whitehead  presents as a 69 y.o.-year-old Right African American Female who appeared her stated age. her dress was Appropriate  and she was Well Groomed and her manners were Appropriate to the situation.  her participation was indicative of Appropriate and Redirectable behaviors.  There were any physical  disabilities noted.  she displayed an appropriate level of cooperation and motivation.     Interactions:    Active Appropriate and Redirectable  Attention:   abnormal and attention span appeared shorter than expected for age  Memory:   within normal limits; recent and remote memory intact  Visuo-spatial:  not examined  Speech (Volume):  normal  Speech:   normal; normal  Thought Process:  Coherent and Relevant  Though Content:  WNL; not suicidal and not homicidal  Orientation:   person, place, time/date and situation  Judgment:   Fair  Planning:   Poor  Affect:    Appropriate  Mood:    Euthymic  Insight:   Fair  Intelligence:   normal  Substance Use:  There is a documented history of cocaine abuse confirmed by the patient.    Medical History:   Past Medical History:  Diagnosis Date  . Arthritis   . DDD (degenerative disc disease)   . Degenerative disc disease, lumbar   . Hypertension   . IBS (irritable bowel syndrome)   . Osteopenia       Psychiatric History:  The patient denies any significant prior psychiatric history although she does have a long history of use of cocaine and cocaine abuse.  Family Med/Psych History:  Family History  Problem Relation Age of Onset  . Breast cancer Maternal Aunt   . Diabetes Mother   . Hypertension Mother   . Glaucoma Mother   . Diabetes Sister   . Hypertension Sister   . Cancer Sister    Impression/DX:  Alison Whitehead is a 69 year old female with a history of hypertension with no medications for a month, OSA bilateral knees, cocaine abuse.  The patient was admitted on 10/01/2019 with onset of weakness and dizziness followed by nausea and vomiting.  Urine drug screen was positive for opiates (which were prescribed) and cocaine.  Blood pressure markedly elevated at 183/120.  MRI brain done revealing acute/early subacute microhemorrhage with hemorrhagic lacunar infarcts in the right pons, advanced chronic small vessel  disease and multiple supratentorial chronic microhemorrhages question due to sequela of hypertensive microangiopathy.  Stroke was felt due to be secondary to small vessel disease and low-dose aspirin was recommended for secondary stroke prevention.  Blood pressure medications have been titrated.  The patient was referred for comprehensive inpatient rehabilitation due to functional decline.  The patient was well oriented showing good mental status today.  She had questions about the cause of her stroke and whether cocaine played a role in this issue.  We reviewed risk factors for stroke with cocaine in conjunction with hypertension and small vessel disease.  The patient reports that she felt better understand some of the risk of cocaine use and reports that her plans are to completely discontinue its use/abuse.  The patient denied any depressive or anxiety based symptoms and reports that she is coping fairly well with the extended hospital stay and feels like the rehabilitation efforts are going to be good for her and she would benefit from them.  Disposition/Plan:  Worked on coping issues today particular around her history of cocaine abuse and its increased risk for stroke given her small vessel disease and hypertension.  Diagnosis:    Pontine Stroke, SVD        Electronically Signed   _______________________ Ilean Skill,  Psy.D.

## 2019-10-11 NOTE — Patient Care Conference (Signed)
Inpatient RehabilitationTeam Conference and Plan of Care Update Date: 10/11/2019   Time: 10:10 AM   Patient Name: Alison Whitehead      Medical Record Number: [id number]  Date of Birth: 05/31/1951 Sex: Female         Room/Bed: 4W16C/4W16C-02 Payor Info: Payor: [employer] MEDICARE / Plan: [place] MEDICARE HMO/PPO / Product Type: *No Product type* /    Admit Date/Time:  10/06/2019  1:58 PM  Primary Diagnosis:  Right pontine stroke [facility])  Patient Active Problem List   Diagnosis Date Noted  . Small vessel disease, cerebrovascular   . Prediabetes   . AKI (acute kidney injury) ([place])   . Orthostatic hypotension   . Right pontine stroke ([place]) 10/06/2019  . Acute cerebrovascular accident (CVA) ([place]) 09/30/2019  . Unilateral primary osteoarthritis, left knee 03/17/2019  . Unilateral primary osteoarthritis, right knee 03/17/2019  . Bilateral primary osteoarthritis of knee 03/10/2019  . INSOMNIA, CHRONIC 05/19/2007  . SPONDYLOSIS, LUMBAR 05/19/2007  . Essential hypertension 05/03/2007  . ARTHRITIS, KNEE 05/03/2007    Expected Discharge Date: Expected Discharge Date: 10/19/19  Team Members Present: Physician leading conference: Dr. Leeroy Cha Care Coodinator Present: Loralee Pacas, LCSWA;Genie Waynesha Rammel, RN, MSN Nurse Present: Debroah Loop, RN PT Present: Burnard Bunting, PT OT Present: Willeen Cass, OT SLP Present: Weston Anna, SLP PPS Coordinator present : Gunnar Fusi, Novella Olive, PT     Current Status/Progress Goal Weekly Team Focus  Bowel/Bladder   continent of bowel and bladder  maintain continence  QS/PRN assessment of needs to urinate or bm   Swallow/Nutrition/ Hydration             ADL's   CGA BADL/transfers; poor safety awareness and memory impacting independence  S overall  safety awareness, balance training, BADL training, DME education   Mobility   CGA 150' gait w/ RW, limited by low BP and nausea/dizziness at times, poor safety awareness  supervision   OOB/upright tolerance, all functional mobility, balance, safety awareness   Communication             Safety/Cognition/ Behavioral Observations  Min-Mod A  Supervision  complex problem solving, safety, recall with use of strategies, attention   Pain   no S&S of pain, PRN tylenol and prn tramadol  pain <4 0-10 scale  PRN/QS assessment of pain   Skin   intact, no evidence of breakdown  maintain skin integrity  PRN/QS assessment of skin integrity    Rehab Goals Patient on target to meet rehab goals: Yes *See Care Plan and progress notes for long and short-term goals.     Barriers to Discharge  Current Status/Progress Possible Resolutions Date Resolved   Nursing  Other (comments)               PT                    OT                  SLP                SW Decreased caregiver support;Lack of/limited family support Caregiver works            Discharge Planning/Teaching Needs:  D/c to home with intermittent support from her daughter  Family education as recommended by therapy   Team Discussion: Nausea, scopolamine patch ordered, headache, sleeping poorly.  RN tylenol for headache, antivert prn nausea.  OT CGA/S, decreased safety awareness, low BP with change in position, 75/55 with  TEDs.  PT CGA walker 150', dizzy/nausea spells, poor safety awareness, S goals.  SLP practiced pill box, could not recall meds, needs S at DC.  Will need 24/7 S, Dtr works FT and can provide intermittent S.   Revisions to Treatment Plan: N/A     Medical Summary Current Status: Continues to have nausea, hypotension during therapy sessions Weekly Focus/Goal: Use of abdominal binder, gradual reduction of hypertensive medications, management of nausea  Barriers to Discharge: Medical stability   Possible Resolutions to Barriers: Use of abdominal binder, gradual reduction of hypertensive medications, management of nausea, continue intensive therapies   Continued Need for Acute Rehabilitation Level of  Care: The patient requires daily medical management by a physician with specialized training in physical medicine and rehabilitation for the following reasons: Direction of a multidisciplinary physical rehabilitation program to maximize functional independence : Yes Medical management of patient stability for increased activity during participation in an intensive rehabilitation regime.: Yes Analysis of laboratory values and/or radiology reports with any subsequent need for medication adjustment and/or medical intervention. : Yes   I attest that I was present, lead the team conference, and concur with the assessment and plan of the team.   Retta Diones 10/12/2019, 12:14 PM   Team conference was held via web/ teleconference due to [place] - 19

## 2019-10-11 NOTE — Progress Notes (Signed)
Occupational Therapy Session Note  Patient Details  Name: Alison Whitehead MRN: [id number] Date of Birth: 12-05-1950  Today's Date: 10/11/2019 OT Individual Time: 7096-2836 OT Individual Time Calculation (min): 38 min    Short Term Goals: Week 1:  OT Short Term Goal 1 (Week 1): Pt will transfer to toilet wiht CGA OT Short Term Goal 2 (Week 1): Pt will groomin in standing wiht S OT Short Term Goal 3 (Week 1): Pt will complete toileting with CGA  Skilled Therapeutic Interventions/Progress Updates:  Patient met lying in supine in agreement with OT treatment session. Skilled OT services provided for functional transfers, self-care re-education, and safety awareness as detailed below. Vitals assessed in supine, sitting EOB, and in standing with low BP noted. Patient initially asymptomatic in agreement with light BUE exercise seated EOB with green theraband for shoulder flex/abd and elbow flex/abd. Patient indicated need to void/BM requiring CGA for stand-pivot to wc and BSC over toilet. Patient initially asyptomatic but began c/o dizziness with activity. BP assessed seated in wc after toileting with reading of 87/60. Patient returned to supine and therapy session terminated secondary to symptomatic hypotension with headache 6/10 on pain scale. Missed 38 min of OT treatment session.   Therapy Documentation Precautions:  Precautions Precautions: Fall, Other (comment) Precaution Comments: watch BP (MD parameters <140/80 and was orthostatic at eval); can be impulsive Restrictions Weight Bearing Restrictions: No  Vital Signs: Supine: BP: 92/67 HR: 70bpm EOB:BP: 91/76 HR: 80bpm Standing: BP: 93/62 HR: 88bpm  Therapy/Group: Individual Therapy  Ernesto Zukowski R [place] 10/11/2019, 8:15 AM

## 2019-10-12 ENCOUNTER — Inpatient Hospital Stay (HOSPITAL_COMMUNITY): Payer: Medicare HMO | Admitting: Speech Pathology

## 2019-10-12 ENCOUNTER — Inpatient Hospital Stay (HOSPITAL_COMMUNITY): Payer: Medicare HMO | Admitting: Occupational Therapy

## 2019-10-12 ENCOUNTER — Inpatient Hospital Stay (HOSPITAL_COMMUNITY): Payer: Medicare HMO | Admitting: Physical Therapy

## 2019-10-12 LAB — GLUCOSE, CAPILLARY
Glucose-Capillary: 83 mg/dL (ref 70–99)
Glucose-Capillary: 92 mg/dL (ref 70–99)
Glucose-Capillary: 131 mg/dL — ABNORMAL HIGH (ref 70–99)
Glucose-Capillary: 86 mg/dL (ref 70–99)

## 2019-10-12 NOTE — Plan of Care (Signed)
  Problem: Consults Goal: RH STROKE PATIENT EDUCATION Description: See Patient Education module for education specifics  Outcome: Progressing Goal: Nutrition Consult-if indicated Outcome: Progressing Goal: Diabetes Guidelines if Diabetic/Glucose > 140 Description: If diabetic or lab glucose is > 140 mg/dl - Initiate Diabetes/Hyperglycemia Guidelines & Document Interventions  Outcome: Progressing   Problem: RH BOWEL ELIMINATION Goal: RH STG MANAGE BOWEL WITH ASSISTANCE Description: STG Manage Bowel with  moderate Assistance. Outcome: Progressing Goal: RH STG MANAGE BOWEL W/MEDICATION W/ASSISTANCE Description: STG Manage Bowel with Medication with moderate Assistance. Outcome: Progressing   Problem: RH SKIN INTEGRITY Goal: RH STG SKIN FREE OF INFECTION/BREAKDOWN Description: Skin free from infection entire stay on rehab Outcome: Progressing Goal: RH STG ABLE TO PERFORM INCISION/WOUND CARE W/ASSISTANCE Description: STG Able To Perform Incision/Wound Care With Assistance. Outcome: Progressing   Problem: RH SAFETY Goal: RH STG ADHERE TO SAFETY PRECAUTIONS W/ASSISTANCE/DEVICE Description: STG Adhere to Safety Precautions With  moderate Assistance/Device. Outcome: Progressing   Problem: RH PAIN MANAGEMENT Goal: RH STG PAIN MANAGED AT OR BELOW PT'S PAIN GOAL Description: Pain less than 2 Outcome: Progressing   Problem: RH KNOWLEDGE DEFICIT Goal: RH STG INCREASE KNOWLEDGE OF HYPERTENSION Description: Moderate assistance Outcome: Progressing

## 2019-10-12 NOTE — Progress Notes (Signed)
Physical Therapy Session Note  Patient Details  Name: Alison Whitehead MRN: [id number] Date of Birth: Aug 16, 1950  Today's Date: 10/12/2019 PT Individual Time: 0950-1050 PT Individual Time Calculation (min): 60 min   Short Term Goals: Week 1:  PT Short Term Goal 1 (Week 1): Will be able to transfer with LRAD and MinA PT Short Term Goal 2 (Week 1): Will tolerate gait training at least 122ft with min guard and LRAD PT Short Term Goal 3 (Week 1): Will be able to maintain dynamic balance with MinA with no UE support  Skilled Therapeutic Interventions/Progress Updates:   Pt in supine and agreeable to therapy, denies pain. BP 132/78 while seated EOB, donned thigh-high TEDs total assist. Sit<>stand to RW w/ CGA and ambulated to therapy gym w/ CGA using RW. Tactile and verbal cues for upright posture and for RW management. Pt conversational during gait bout, denied dizziness/nausea. BP 132/83 after gait bout. Practiced stairs w/ B rails, CGA x4 steps, and practiced gait w/o AD. Ambulated 100' w/o AD, CGA-min assist. Suspect that pt's flexed trunk posture was 2/2 RW use, however pt w/ same flexed posture when ambulating w/o RW. Pt looked in mirror and stated she doesn't believe she walked in that position prior to stroke but cannot say for certain. Pt w/ overall poor awareness of stroke deficits and poor anticipatory awareness of how her current medical and neurological status (fluctuating BP, intermittent nausea/dizziness, and impaired memory) would negatively impact safety at home. Pt states she would have no problem performing ADLs/IADLs. Pt also denies having memory deficits although will state she cannot remember interactions she has had with this therapist. However, towards end of session, pt appeared more open and willing to recognize current deficits stating "I didn't realize how this stroke affected me". Ambulated 150' w/ RW, CGA to other therapy gym. Pt self-propelled w/c back w/ BUEs to work on  BUE strengthening and global endurance. Pt w/ good ability to steer and coordinate BUE use compared to previous attempts with this therapist. Upon return to room, pt's BP 130/80 and pt continued to deny dizziness. Pt requesting to return to bed to rest 2/2 fatigue. Ended session in supine, all needs in reach. Missed 15 min of skilled PT 2/2 fatigue.   Therapy Documentation Precautions:  Precautions Precautions: Fall, Other (comment) Precaution Comments: watch BP (MD parameters <140/80 and was orthostatic at eval); can be impulsive Restrictions Weight Bearing Restrictions: No Vital Signs: Therapy Vitals Temp: 98.2 F (36.8 C) Temp Source: Oral Pulse Rate: 90 Resp: 18 BP: 129/83 Patient Position (if appropriate): Standing Oxygen Therapy SpO2: 98 % O2 Device: Room Air Pain:    Therapy/Group: Individual Therapy  Pritika Alvarez Clent Demark 10/12/2019, 6:04 PM

## 2019-10-12 NOTE — Progress Notes (Signed)
Occupational Therapy Session Note  Patient Details  Name: Alison Whitehead MRN: [id number] Date of Birth: Nov 30, 1950  Today's Date: 10/12/2019 OT Individual Time: 1400-1500 OT Individual Time Calculation (min): 60 min    Short Term Goals: Week 1:  OT Short Term Goal 1 (Week 1): Pt will transfer to toilet wiht CGA OT Short Term Goal 2 (Week 1): Pt will groomin in standing wiht S OT Short Term Goal 3 (Week 1): Pt will complete toileting with CGA  Skilled Therapeutic Interventions/Progress Updates:  Patient met lying supine in bed in agreement with OT treatment session. BP assessed throughout. Please refer to vitals section below. Supine to EOB with supervision A and sit to stand from EOB to RW with CGA. Functional ambulation with CGA to and from day room with education on walker safety including proximity to RW. Unsupported dynamic standing balance activity with CGA for safety. Patient c/o dizziness after standing ~2-3 min BP assessed with reading WNL. Patient reported need to void/BM. Ambulation to bathroom in room with CGA. Patient completed toileting/hygiene/clotihng management with CGA for safety with hygiene in standing. Hand washing standing at sink level with vc's for turning RW toward sink. Patient return to supine with supervision A. Call bell within reach, bed alarm activated and all needs met. Patient continues to be limited by decreased safety awareness, decreased activity pacing, and dizziness with change in position despite education on safety when going from lying > sitting EOB > standing.   Therapy Documentation Precautions:  Precautions Precautions: Fall, Other (comment) Precaution Comments: watch BP (MD parameters <140/80 and was orthostatic at eval); can be impulsive Restrictions Weight Bearing Restrictions: No Vital Signs: Supine: 124/83 EOB: 125/76 Standing:L 129/83  After activity:  Therapy/Group: Individual Therapy  [name] 10/12/2019, 2:09  PM

## 2019-10-12 NOTE — Progress Notes (Addendum)
Social Work Patient ID: Shianne Bandy, female   DOB: July 20, 1951, 69 y.o.   MRN: [id number]   SW returned phone call to pt sister Mardene Celeste [phone]) to discuss pt care needs, and updates from team. Family is considering short term rehab-SNF as an option for pt. SW provided updates from PT and primary issues surrounding pt memory. SW reiterated that pt does not require SNF and needs 24/7 supervision for meal prep and housekeeping due to cognition. The sister intends to speak with the patient's daughters and will discuss with patient as well. SW reviewed SNF placement process, and reiterated pt will need to be in agreement, and insurance must approve for placement. SW also stressed the importance of family education for the family to observe gains and pt areas she requires help.   Existing PASRR#: [id number]. SW sent out referrals based on zipcode [email or web address]).   SW left SNF list at reception desk for pick-up.   *SW received return phone call from pt sister Mardene Celeste stating they will be here on Saturday 10am-12pm for family education with her and pt dtr Hoyle Sauer. SW informed team.  Loralee Pacas, MSW, [place] Office: [phone] Cell: [phone] Fax: [phone]

## 2019-10-12 NOTE — Progress Notes (Signed)
Speech Language Pathology Daily Session Note  Patient Details  Name: Alison Whitehead MRN: [id number] Date of Birth: 1951-07-14  Today's Date: 10/12/2019 SLP Individual Time: [id number] SLP Individual Time Calculation (min): 55 min  Short Term Goals: Week 1: SLP Short Term Goal 1 (Week 1): Pt will demonstrate ability to selectively attend to functional tasks for 15 minute intervals with Min A cues for redirection. SLP Short Term Goal 2 (Week 1): Pt will demonstrate ability to problem solving during semi-complex to complex tasks with Min A verbal/visual cues. SLP Short Term Goal 3 (Week 1): Pt will demonstrate ability to recall new and daily information with Min A for use of compensatory memory strategies. SLP Short Term Goal 4 (Week 1): Pt will demonstrate organizational strategies during functional tasks with Min A verbal/visual cues. SLP Short Term Goal 5 (Week 1): Pt will demonstrate intellectual awareness by stating at least 1 physical and 1 cognitive impairment with Mod A verbal/visual cues.  Skilled Therapeutic Interventions: Skilled treatment session focused on cognitive goals. Patient reported she needed to use the bathroom and required Mod-Max verbal cues for safety awareness due to impulsivity. Patient was continent of bowel and required Max verbal cues for safety with pericare (bending over without waiting for assistance). SLP also facilitated session by providing supervision level verbal cues for complex organization and problem solving during a calendar making task. With Min verbal cues, patient wrote down events from therapy session to maximize carryover of information. Patient left upright in bed with alarm on and all needs within reach. Continue with current plan of care.      Pain Pain Assessment Pain Scale: 0-10 Pain Score: 0-No pain  Therapy/Group: Individual Therapy  Alison Whitehead 10/12/2019, 9:48 AM

## 2019-10-12 NOTE — NC FL2 (Signed)
[place] LEVEL OF CARE SCREENING TOOL     IDENTIFICATION  Patient Name: Alison Whitehead Birthdate: 18-Oct-1950 Sex: female Admission Date (Current Location): 10/06/2019  [facility] and [place] Number:  Herbalist and Address:  [facility] [place] [address] [place], [place] [address]      Provider Number: [id number]  Attending Physician Name and Address:  Meredith Staggers, MD  Relative Name and Phone Number:  Candi Leash [phone]    Current Level of Care: Hospital Recommended Level of Care: [place] Prior Approval Number:    Date Approved/Denied:   PASRR Number: [id number]  Discharge Plan: SNF    Current Diagnoses: Patient Active Problem List   Diagnosis Date Noted  . Small vessel disease, cerebrovascular   . Prediabetes   . AKI (acute kidney injury) ([place])   . Orthostatic hypotension   . Right pontine stroke ([place]) 10/06/2019  . Acute cerebrovascular accident (CVA) ([place]) 09/30/2019  . Unilateral primary osteoarthritis, left knee 03/17/2019  . Unilateral primary osteoarthritis, right knee 03/17/2019  . Bilateral primary osteoarthritis of knee 03/10/2019  . INSOMNIA, CHRONIC 05/19/2007  . SPONDYLOSIS, LUMBAR 05/19/2007  . Essential hypertension 05/03/2007  . ARTHRITIS, KNEE 05/03/2007    Orientation RESPIRATION BLADDER Height & Weight     Self, Time, Situation, Place  Normal Continent Weight: 185 lb 3 oz (84 kg) Height:  5\' 7"  ([id number] cm)  BEHAVIORAL SYMPTOMS/MOOD NEUROLOGICAL BOWEL NUTRITION STATUS      Continent Diet(heart healthy/modified carb diet)  AMBULATORY STATUS COMMUNICATION OF NEEDS Skin   Supervision Verbally Normal                       Personal Care Assistance Level of Assistance    Bathing Assistance: Limited assistance Feeding assistance: Independent Dressing Assistance: Independent     Functional Limitations Info  (cognition) Sight Info: Adequate Hearing Info:  Adequate Speech Info: Adequate    SPECIAL CARE FACTORS FREQUENCY  Speech therapy, OT (By licensed OT), PT (By licensed PT)     PT Frequency: 5xs per week OT Frequency: 5xs per week     Speech Therapy Frequency: 5xs per week      Contractures      Additional Factors Info                  Current Medications (10/12/2019):  This is the current hospital active medication list Current Facility-Administered Medications  Medication Dose Route Frequency Provider Last Rate Last Admin  . 0.9 %  sodium chloride infusion   Intravenous Continuous Jamse Arn, MD 75 mL/hr at 10/11/19 2240 1,000 mL at 10/11/19 2240  . acetaminophen (TYLENOL) tablet 325-650 mg  325-650 mg Oral Q4H PRN Bary Leriche, PA-C   650 mg at 10/12/19 0750  . alum & mag hydroxide-simeth (MAALOX/MYLANTA) 200-200-20 MG/5ML suspension 30 mL  30 mL Oral Q4H PRN Bary Leriche, PA-C   30 mL at 10/08/19 0807  . aspirin tablet 325 mg  325 mg Oral Daily Bary Leriche, PA-C   325 mg at 10/12/19 0750  . atorvastatin (LIPITOR) tablet 40 mg  40 mg Oral q1800 Bary Leriche, PA-C   40 mg at 10/11/19 1659  . bisacodyl (DULCOLAX) suppository 10 mg  10 mg Rectal Daily PRN Love, Pamela S, PA-C      . carvedilol (COREG) tablet 6.25 mg  6.25 mg Oral BID WC Love, Pamela S, PA-C   6.25 mg at 10/12/19  0750  . diclofenac Sodium (VOLTAREN) 1 % topical gel 2 g  2 g Topical QID Love, Pamela S, PA-C   2 g at 10/12/19 0750  . diphenhydrAMINE (BENADRYL) 12.5 MG/5ML elixir 12.5-25 mg  12.5-25 mg Oral Q6H PRN Bary Leriche, PA-C   25 mg at 10/10/19 2123  . enoxaparin (LOVENOX) injection 40 mg  40 mg Subcutaneous Q24H Love, Pamela S, PA-C   40 mg at 10/11/19 1400  . feeding supplement (ENSURE ENLIVE) (ENSURE ENLIVE) liquid 237 mL  237 mL Oral BID BM Meredith Staggers, MD   237 mL at 10/11/19 1645  . guaiFENesin-dextromethorphan (ROBITUSSIN DM) 100-10 MG/5ML syrup 5-10 mL  5-10 mL Oral Q6H PRN Love, Pamela S, PA-C      . insulin aspart  (novoLOG) injection 0-5 Units  0-5 Units Subcutaneous QHS Love, Pamela S, PA-C      . insulin aspart (novoLOG) injection 0-9 Units  0-9 Units Subcutaneous TID WC Bary Leriche, PA-C   1 Units at 10/07/19 1837  . isosorbide-hydrALAZINE (BIDIL) 20-37.5 MG per tablet 1 tablet  1 tablet Oral TID Bary Leriche, PA-C   1 tablet at 10/12/19 0750  . lisinopril (ZESTRIL) tablet 5 mg  5 mg Oral Q supper Raulkar, Clide Deutscher, MD   5 mg at 10/11/19 1700  . loratadine (CLARITIN) tablet 10 mg  10 mg Oral Daily Bary Leriche, PA-C   10 mg at 10/12/19 0750  . meclizine (ANTIVERT) tablet 12.5 mg  12.5 mg Oral TID PRN Bary Leriche, PA-C   12.5 mg at 10/12/19 0750  . Melatonin TABS 3 mg  3 mg Oral QHS PRN Meredith Staggers, MD      . multivitamin with minerals tablet 1 tablet  1 tablet Oral Daily Bary Leriche, PA-C   1 tablet at 10/12/19 0750  . Muscle Rub CREA   Topical BID PRN Theotis Burrow, [facility]   Given at 10/09/19 1926  . polyethylene glycol (MIRALAX / GLYCOLAX) packet 17 g  17 g Oral Daily PRN Love, Pamela S, PA-C      . polyethylene glycol (MIRALAX / GLYCOLAX) packet 17 g  17 g Oral Daily Bary Leriche, PA-C   17 g at 10/12/19 0750  . prochlorperazine (COMPAZINE) tablet 5-10 mg  5-10 mg Oral Q6H PRN Bary Leriche, PA-C   10 mg at 10/09/19 2150   Or  . prochlorperazine (COMPAZINE) injection 5-10 mg  5-10 mg Intramuscular Q6H PRN Love, Pamela S, PA-C       Or  . prochlorperazine (COMPAZINE) suppository 12.5 mg  12.5 mg Rectal Q6H PRN Love, Pamela S, PA-C      . scopolamine (TRANSDERM-SCOP) 1 MG/3DAYS 1.5 mg  1 patch Transdermal Q72H Raulkar, Clide Deutscher, MD   1.5 mg at 10/11/19 1701  . senna-docusate (Senokot-S) tablet 2 tablet  2 tablet Oral QHS Bary Leriche, PA-C   2 tablet at 10/11/19 2146  . sodium phosphate (FLEET) 7-19 GM/118ML enema 1 enema  1 enema Rectal Once PRN Love, Pamela S, PA-C      . topiramate (TOPAMAX) tablet 25 mg  25 mg Oral QHS Love, Pamela S, PA-C   25 mg at 10/11/19 1932  .  topiramate (TOPAMAX) tablet 25 mg  25 mg Oral Daily Raulkar, Clide Deutscher, MD   25 mg at 10/12/19 0750  . traMADol (ULTRAM) tablet 50 mg  50 mg Oral QID PRN Bary Leriche, PA-C   50 mg at 10/07/19  57  . traZODone (DESYREL) tablet 25-50 mg  25-50 mg Oral QHS PRN Bary Leriche, PA-C   50 mg at 10/11/19 2146     Discharge Medications: Please see discharge summary for a list of discharge medications.  Relevant Imaging Results:  Relevant Lab Results:   Additional Information    Rana Snare, LCSW

## 2019-10-12 NOTE — Progress Notes (Signed)
[place] PHYSICAL MEDICINE & REHABILITATION PROGRESS NOTE   Subjective/Complaints: Met with SW and Neuropsych yesterday Nausea improved this morning and she participated better with therapies.  BP currently well controlled; will maintain current regimen.   ROS: Denies CP, SOB, N/V/D  Objective:   No results found. Recent Labs    10/10/19 0536  WBC 4.4  HGB 13.4  HCT 41.9  PLT 240   Recent Labs    10/10/19 0536  NA 138  K 3.8  CL 102  CO2 23  GLUCOSE 112*  BUN 36*  CREATININE 1.42*  CALCIUM 9.3    Intake/Output Summary (Last 24 hours) at 10/12/2019 0816 Last data filed at 10/12/2019 0700 Gross per 24 hour  Intake 480 ml  Output --  Net 480 ml     Physical Exam: Vital Signs Blood pressure (!) 137/93, pulse 74, temperature 98.1 F (36.7 C), temperature source Oral, resp. rate 18, height '5\' 7"'$  (1.702 m), weight 84 kg, SpO2 99 %. Constitutional: No distress . Vital signs reviewed. Lying in bed, appears more comfortable.  HENT: Normocephalic.  Atraumatic. Eyes: EOMI. No discharge. Cardiovascular: No JVD. Respiratory: Normal effort.  No stridor. GI: Non-distended. Skin: Warm and dry.  Intact. Psych: Normal mood.  Normal behavior. Musc: No edema in extremities.  No tenderness in extremities. Participated well with PT today.  Neuro: Alert Motor: Grossly 4+/5 throughout   Assessment/Plan: 1. Functional deficits secondary to right posterior pontine infarct which require 3+ hours per day of interdisciplinary therapy in a comprehensive inpatient rehab setting.  Physiatrist is providing close team supervision and 24 hour management of active medical problems listed below.  Physiatrist and rehab team continue to assess barriers to discharge/monitor patient progress toward functional and medical goals  Care Tool:  Bathing    Body parts bathed by patient: Right arm, Left arm, Chest, Abdomen, Front perineal area, Buttocks, Right upper leg, Left upper leg, Right  lower leg, Left lower leg, Face         Bathing assist Assist Level: Supervision/Verbal cueing(Seated at sink level with Mod vc's for pacing/safety)     Upper Body Dressing/Undressing Upper body dressing   What is the patient wearing?: Pull over shirt    Upper body assist Assist Level: Supervision/Verbal cueing(Seated)    Lower Body Dressing/Undressing Lower body dressing      What is the patient wearing?: Pants, Underwear/pull up     Lower body assist Assist for lower body dressing: Contact Guard/Touching assist(Mod vc's for safety and activity pacing.)     Toileting Toileting Toileting Activity did not occur (Clothing management and hygiene only): N/A (no void or bm)  Toileting assist Assist for toileting: Contact Guard/Touching assist     Transfers Chair/bed transfer  Transfers assist     Chair/bed transfer assist level: Minimal Assistance - Patient > 75%     Locomotion Ambulation   Ambulation assist   Ambulation activity did not occur: Safety/medical concerns(orthostatic)  Assist level: Contact Guard/Touching assist Assistive device: Walker-rolling Max distance: 150'   Walk 10 feet activity   Assist  Walk 10 feet activity did not occur: Safety/medical concerns(orthostatic)  Assist level: Contact Guard/Touching assist Assistive device: Walker-rolling   Walk 50 feet activity   Assist Walk 50 feet with 2 turns activity did not occur: Safety/medical concerns(orthostatic)  Assist level: Contact Guard/Touching assist Assistive device: Walker-rolling    Walk 150 feet activity   Assist Walk 150 feet activity did not occur: Safety/medical concerns(orthostatic)  Assist level: Contact Guard/Touching assist Assistive  device: Walker-rolling    Walk 10 feet on uneven surface  activity   Assist Walk 10 feet on uneven surfaces activity did not occur: Safety/medical concerns(orthostatic)         Wheelchair     Assist Will patient use  wheelchair at discharge?: No      Wheelchair assist level: Moderate Assistance - Patient 50 - 74% Max wheelchair distance: 38f    Wheelchair 50 feet with 2 turns activity    Assist    Wheelchair 50 feet with 2 turns activity did not occur: Safety/medical concerns(orthostatic)       Wheelchair 150 feet activity     Assist  Wheelchair 150 feet activity did not occur: Safety/medical concerns(orthostatic)       Blood pressure (!) 137/93, pulse 74, temperature 98.1 F (36.7 C), temperature source Oral, resp. rate 18, height '5\' 7"'$  (1.702 m), weight 84 kg, SpO2 99 %.  Medical Problem List and Plan: 1.  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   Team conference 3/9. Sister should not have to provide much physical assistance at home, just supervision due to poor safety awareness. Sister is RTherapist, sports  2.  Antithrombotics: -DVT/anticoagulation:  Pharmaceutical: Lovenox             -antiplatelet therapy: ASA 3. Persistent HA/Pain Management: Will change oxycodone to ultram prn. Add topamax to treat HA.             Appears controlled with medications on 3/8   3/9: Will start Topiramate '25mg'$  daily.   3/10: No headache today.    Continue with above regimen   4. Mood: LCSW to follow for evaluation and support.              -antipsychotic agents: N/A. Appreciate neuropsych evaluation.  5. Neuropsych: This patient is not capable of making decisions on her own behalf. 6. Skin/Wound Care: Routine pressure relief measures.  7. Fluids/Electrolytes/Nutrition: encourage po 8. HTN: Monitor BP   HCTZ, amlodipine DC'd  Norvasc, coreg bid, Bidil, lisinopril, Zestril. Has not taken medications for months. Will need to monitor for hypotension as medications get to steady state.             -need to keep BP <140/80 per Neurology   Lisinopril decreased to 10 on 3/8.   +  Orthostasis on 3/8 - ACE wraps/Abd binder as necessary  3/9: lisinopril decreased to '5mg'$ .   3/10: Currently well controlled; maintain current dosage. Continue ACE and abdominal binder during therapy.   Encourage fluids 9. OA bilateral knees: continue Voltaren gel to knees qid. Avoid narcotics due to ongoing polysubstance abuse.  10. AKI:   See #8  -encourage fluids  Creatinine 1.42 on 3/8  IVF nightly x2 nights started 3/8, echocardiogram with ejection fraction of 55 to 60% 11. Prediabetes: Hgb A1c- 6.2. Will monitor BS ac/hs and have RD educate on CM diet.    Labile on 3/8, monitor trend  3/9: well controlled.  12. Nausea: scopolamine patch, better controlled.   LOS: 6 days A FACE TO FACE EVALUATION WAS PERFORMED  Koleman Marling P Oluwatosin Bracy 10/12/2019, 8:16 AM

## 2019-10-13 ENCOUNTER — Inpatient Hospital Stay (HOSPITAL_COMMUNITY): Payer: Medicare HMO | Admitting: Occupational Therapy

## 2019-10-13 ENCOUNTER — Inpatient Hospital Stay (HOSPITAL_COMMUNITY): Payer: Medicare HMO | Admitting: Speech Pathology

## 2019-10-13 ENCOUNTER — Inpatient Hospital Stay (HOSPITAL_COMMUNITY): Payer: Medicare HMO | Admitting: Physical Therapy

## 2019-10-13 LAB — GLUCOSE, CAPILLARY
Glucose-Capillary: 102 mg/dL — ABNORMAL HIGH (ref 70–99)
Glucose-Capillary: 125 mg/dL — ABNORMAL HIGH (ref 70–99)
Glucose-Capillary: 107 mg/dL — ABNORMAL HIGH (ref 70–99)
Glucose-Capillary: 87 mg/dL (ref 70–99)

## 2019-10-13 MED ORDER — MECLIZINE HCL 25 MG PO TABS
12.5000 mg | ORAL_TABLET | Freq: Three times a day (TID) | ORAL | Status: DC
  Administered 2019-10-13 – 2019-10-28 (×44): 12.5 mg via ORAL
  Filled 2019-10-13 (×44): qty 1

## 2019-10-13 MED ORDER — ONDANSETRON HCL 4 MG PO TABS: 4.0000 mg | ORAL_TABLET | Freq: Three times a day (TID) | ORAL | Status: DC | PRN

## 2019-10-13 NOTE — Progress Notes (Signed)
Speech Language Pathology Daily Session Note  Patient Details  Name: Shayle Age MRN: [id number] Date of Birth: Feb 03, 1951  Today's Date: 10/13/2019 SLP Individual Time: 1100-1155 SLP Individual Time Calculation (min): 55 min  Short Term Goals: Week 1: SLP Short Term Goal 1 (Week 1): Pt will demonstrate ability to selectively attend to functional tasks for 15 minute intervals with Min A cues for redirection. SLP Short Term Goal 2 (Week 1): Pt will demonstrate ability to problem solving during semi-complex to complex tasks with Min A verbal/visual cues. SLP Short Term Goal 3 (Week 1): Pt will demonstrate ability to recall new and daily information with Min A for use of compensatory memory strategies. SLP Short Term Goal 4 (Week 1): Pt will demonstrate organizational strategies during functional tasks with Min A verbal/visual cues. SLP Short Term Goal 5 (Week 1): Pt will demonstrate intellectual awareness by stating at least 1 physical and 1 cognitive impairment with Mod A verbal/visual cues.  Skilled Therapeutic Interventions: Skilled treatment session focused on cognitive goals. SLP facilitated session by providing Min A verbal cues for safety with the RW while ambulating to and from the commode X 2. SLP also facilitated session by providing education in regards to memory compensatory strategies and how to incorporate the strategies at home. Patient verbalized understanding of information and generated her own ideas of how to incorporate strategies with overall Min A verbal cues. Handout was given to reinforce information. Patient left upright in recliner with alarm on and all needs within reach. Continue with current plan of care.      Pain No/Denies Pain   Therapy/Group: Individual Therapy  Pius Byrom 10/13/2019, 12:22 PM

## 2019-10-13 NOTE — Progress Notes (Signed)
Physical Therapy Session Note  Patient Details  Name: Alison Whitehead MRN: [id number] Date of Birth: 1951-03-24  Today's Date: 10/13/2019 PT Individual Time: 4097-3532 PT Individual Time Calculation (min): 68 min   Short Term Goals: Week 1:  PT Short Term Goal 1 (Week 1): Will be able to transfer with LRAD and MinA PT Short Term Goal 2 (Week 1): Will tolerate gait training at least 150f with min guard and LRAD PT Short Term Goal 3 (Week 1): Will be able to maintain dynamic balance with MinA with no UE support  Skilled Therapeutic Interventions/Progress Updates:   Pt in recliner and agreeable to therapy, reports headache but premedicated. Pt reporting feeling hungry, provided w/ sips of Ensure throughout session. Ambulated to day room w/ CGA using RW, verbal cues for upright posture. Pt reports she discussed d/c plan w/ sister and sister requesting that she d/c to her house so she can provide IADL assist. Pt appears more open to this today and verbalizes understanding the benefits of d/c to sister's house for more support, however she wants to be as independent as possible. Educated pt on realistic stroke recovery expectations as well as how much progress she has already made. Performed Berg Balance Scale and scored 32/56, explained significance of results to pt. Multiple tasks were dizziness-provoking for pt, however she could not identify what specific movements triggered the dizziness. Performed brief vestibular screen to rule out both peripheral and central contributions to dizziness. All testing was negative for provoking dizziness similar to what she describes when up and moving. Oculomotor exam almost WNL w/ exception of slow saccades w/ additional eye shifts and decreased smoothness of all smooth pursuits. Dizziness resolves w/ brief rest breaks throughout session. Performed NuStep 12 min @ level 1 to work on endurance and global strengthening. Ambulated back to room. Ended session in  care of NT, all needs met.   Therapy Documentation Precautions:  Precautions Precautions: Fall, Other (comment) Precaution Comments: watch BP (MD parameters <140/80 and was orthostatic at eval); can be impulsive Restrictions Weight Bearing Restrictions: No Vital Signs: Therapy Vitals Temp: 97.6 F (36.4 C) Temp Source: Oral Pulse Rate: 98 Resp: 20 BP: 112/73 Patient Position (if appropriate): Standing Oxygen Therapy SpO2: 99 % O2 Device: Room Air Pain:    Therapy/Group: Individual Therapy  Melida Northington KClent Demark3/06/2020, 3:28 PM

## 2019-10-13 NOTE — Progress Notes (Signed)
Occupational Therapy Weekly Progress Note  Patient Details  Name: Alison Whitehead MRN: [id number] Date of Birth: Jun 21, 1951  Beginning of progress report period: October 07, 2019 End of progress report period: October 13, 2019  Today's Date: 10/13/2019 OT Individual Time: 0850-1000 OT Individual Time Calculation (min): 70 min    Patient has met 1 of 9 long term goals. Short term goals not set due to estimated length of stay.  Patient has made progress toward OT goals this reporting period. Patient able to complete functional transfers with no more than CGA with use of RW, and toileitng/hygiene/clohting management with CGA. UB bathing/dressing seated on TTB with supervision A and LB bath/dress in sitting/standing with CGA and use of RW. Patient has been limited by hypotension, headaches, and increased dizziness with change in position requiring thigh high support TED hose and frequent monitoring of vitals with activity. Patient demonstrates increased safety awareness but still requires Min vc's for safety with functional transfers and walker management during ADLs including tub/shower and toilet transfers.   Patient continues to demonstrate the following deficits: muscle weakness, decreased safety awareness, decreased memory, and hypotenesion therefore will continue to benefit from skilled OT intervention to enhance overall performance with BADL and iADL.  See Patient's Care Plan for progression toward long term goals.  Patient progressing toward long term goals..  Continue plan of care.  Skilled Therapeutic Interventions/Progress Updates:  Patient met lying in supine in agreement with OT treatment session. Skilled OT focused on functional transfers and self-care re-education as detailed below. Toileting/hygiene/clothing management with CGA for safety. Patient demonstrating good carryover of previous education on widening base of support in standing during hygiene management as opposed to leaning  over front of RW as this presents a fall risk. Patient transported to ADL apartment for bathing in tub/shower combination in prep for safe return home. Patient notes that she is moving to a new apartment, showing pictures of new bathroom to this therapist. Patient has a tub/shower combo with grab bars. Patient completed tub/shower transfer using TTB and CGA with vc's for hand placement. Patient able to doff clothing in sitting/standing with Min vc's for safety. UB bath/dress with supervision A and LB bath/dress with CGA in sitting/standing with use of grab bar and HH shower head. Patient able to don footwear including socks/shoes/TED hose with Min A for TED hose only. Patient returned to room in bathroom for time for completion of grooming seated at sink level with set-up assist in sitting secondary to fatigue. Patient left seated in recliner with call bell within reach, chair alarm activated, and all needs met.   Therapy Documentation Precautions:  Precautions Precautions: Fall, Other (comment) Precaution Comments: watch BP (MD parameters <140/80 and was orthostatic at eval); can be impulsive Restrictions Weight Bearing Restrictions: No Vital Signs:   Therapy/Group: Individual Therapy  Fumiye Lubben R Howerton-Davis 10/13/2019, 7:52 AM

## 2019-10-13 NOTE — Progress Notes (Signed)
[facility] PHYSICAL MEDICINE & REHABILITATION PROGRESS NOTE   Subjective/Complaints: Continues to have nausea.  Has allergies this morning and received Claritin.  BP remained stable throughout OT and PT yesterday.  Sessions were limited by dizziness; will schedule Meclizine as has been using sparingly.   ROS: Denies CP, SOB, N/V/D  Objective:   No results found. No results for input(s): WBC, HGB, HCT, PLT in the last 72 hours. No results for input(s): NA, K, CL, CO2, GLUCOSE, BUN, CREATININE, CALCIUM in the last 72 hours.  Intake/Output Summary (Last 24 hours) at 10/13/2019 1123 Last data filed at 10/13/2019 0830 Gross per 24 hour  Intake 200 ml  Output --  Net 200 ml     Physical Exam: Vital Signs Blood pressure 118/83, pulse 77, temperature 97.6 F (36.4 C), temperature source Oral, resp. rate 18, height 5\' 7"  (1.702 m), weight 84 kg, SpO2 97 %. Constitutional: No distress . Vital signs reviewed. Lying in bed, sneezing, appears nauseous.  HENT: Normocephalic.  Atraumatic. Eyes: EOMI. No discharge. Cardiovascular: No JVD. Respiratory: Normal effort.  No stridor. GI: Non-distended. Skin: Warm and dry.  Intact. Psych: Normal mood.  Normal behavior. Musc: No edema in extremities.  No tenderness in extremities. Participated well with PT today.  Neuro: Alert Motor: Grossly 4+/5 throughout   Assessment/Plan: 1. Functional deficits secondary to right posterior pontine infarct which require 3+ hours per day of interdisciplinary therapy in a comprehensive inpatient rehab setting.  Physiatrist is providing close team supervision and 24 hour management of active medical problems listed below.  Physiatrist and rehab team continue to assess barriers to discharge/monitor patient progress toward functional and medical goals  Care Tool:  Bathing    Body parts bathed by patient: Right arm, Left arm, Chest, Abdomen, Front perineal area, Buttocks, Right upper leg, Left upper leg,  Right lower leg, Left lower leg, Face         Bathing assist Assist Level: Supervision/Verbal cueing(Sitting on TTB)     Upper Body Dressing/Undressing Upper body dressing   What is the patient wearing?: Pull over shirt    Upper body assist Assist Level: Supervision/Verbal cueing    Lower Body Dressing/Undressing Lower body dressing      What is the patient wearing?: Pants, Underwear/pull up     Lower body assist Assist for lower body dressing: Contact Guard/Touching assist     Toileting Toileting Toileting Activity did not occur (Clothing management and hygiene only): N/A (no void or bm)  Toileting assist Assist for toileting: Contact Guard/Touching assist     Transfers Chair/bed transfer  Transfers assist     Chair/bed transfer assist level: Contact Guard/Touching assist     Locomotion Ambulation   Ambulation assist   Ambulation activity did not occur: Safety/medical concerns(orthostatic)  Assist level: Contact Guard/Touching assist Assistive device: Walker-rolling Max distance: 150'   Walk 10 feet activity   Assist  Walk 10 feet activity did not occur: Safety/medical concerns(orthostatic)  Assist level: Contact Guard/Touching assist Assistive device: Walker-rolling   Walk 50 feet activity   Assist Walk 50 feet with 2 turns activity did not occur: Safety/medical concerns(orthostatic)  Assist level: Contact Guard/Touching assist Assistive device: Walker-rolling    Walk 150 feet activity   Assist Walk 150 feet activity did not occur: Safety/medical concerns(orthostatic)  Assist level: Contact Guard/Touching assist Assistive device: Walker-rolling    Walk 10 feet on uneven surface  activity   Assist Walk 10 feet on uneven surfaces activity did not occur: Safety/medical concerns(orthostatic)  Wheelchair     Assist Will patient use wheelchair at discharge?: No      Wheelchair assist level: Moderate Assistance -  Patient 50 - 74% Max wheelchair distance: 68ft    Wheelchair 50 feet with 2 turns activity    Assist    Wheelchair 50 feet with 2 turns activity did not occur: Safety/medical concerns(orthostatic)       Wheelchair 150 feet activity     Assist  Wheelchair 150 feet activity did not occur: Safety/medical concerns(orthostatic)       Blood pressure 118/83, pulse 77, temperature 97.6 F (36.4 C), temperature source Oral, resp. rate 18, height 5\' 7"  (1.702 m), weight 84 kg, SpO2 97 %.  Medical Problem List and Plan: 1.  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   Team conference 3/9. Sister should not have to provide much physical assistance at home, just supervision due to poor safety awareness. Sister is Therapist, sports.  2.  Antithrombotics: -DVT/anticoagulation:  Pharmaceutical: Lovenox             -antiplatelet therapy: ASA 3. Persistent HA/Pain Management: Will change oxycodone to ultram prn. Add topamax to treat HA.             Appears controlled with medications on 3/8   3/9: Will start Topiramate 25mg  daily.   3/10: No headache today.    Continue with above regimen   4. Mood: LCSW to follow for evaluation and support.              -antipsychotic agents: N/A. Appreciate neuropsych evaluation.  5. Neuropsych: This patient is not capable of making decisions on her own behalf. 6. Skin/Wound Care: Routine pressure relief measures.  7. Fluids/Electrolytes/Nutrition: encourage po 8. HTN: Monitor BP   HCTZ, amlodipine DC'd  Norvasc, coreg bid, Bidil, lisinopril, Zestril. Has not taken medications for months. Will need to monitor for hypotension as medications get to steady state.             -need to keep BP <140/80 per Neurology   Lisinopril decreased to 10 on 3/8.   + Orthostasis on 3/8 - ACE wraps/Abd binder as necessary  3/9: lisinopril decreased to 5mg .    3/10: Currently well controlled; maintain current dosage. Continue ACE and abdominal binder during therapy.   3/11: Well controlled thorughout therapy sessions yesterday  Encourage fluids 9. OA bilateral knees: continue Voltaren gel to knees qid. Avoid narcotics due to ongoing polysubstance abuse.  10. AKI:   See #8  -encourage fluids  Creatinine 1.42 on 3/8  IVF nightly x2 nights started 3/8, echocardiogram with ejection fraction of 55 to 60% 11. Prediabetes: Hgb A1c- 6.2. Will monitor BS ac/hs and have RD educate on CM diet.    Labile on 3/8, monitor trend  3/9: well controlled.  12. Nausea: scopolamine patch, prn compazine. Will add prn zofran as well. EKG tomorrow morning to monitor qTC.  13. Dizziness: scheduled meclizine  LOS: 7 days A FACE TO FACE EVALUATION WAS PERFORMED  Natassia Guthridge P Keneth Borg 10/13/2019, 11:23 AM

## 2019-10-14 ENCOUNTER — Inpatient Hospital Stay (HOSPITAL_COMMUNITY): Payer: Medicare HMO | Admitting: Physical Therapy

## 2019-10-14 ENCOUNTER — Inpatient Hospital Stay (HOSPITAL_COMMUNITY): Payer: Medicare HMO | Admitting: Occupational Therapy

## 2019-10-14 ENCOUNTER — Inpatient Hospital Stay (HOSPITAL_COMMUNITY): Payer: Medicare HMO | Admitting: Speech Pathology

## 2019-10-14 LAB — GLUCOSE, CAPILLARY
Glucose-Capillary: 118 mg/dL — ABNORMAL HIGH (ref 70–99)
Glucose-Capillary: 140 mg/dL — ABNORMAL HIGH (ref 70–99)
Glucose-Capillary: 91 mg/dL (ref 70–99)

## 2019-10-14 MED ORDER — TOPIRAMATE 25 MG PO TABS
50.0000 mg | ORAL_TABLET | Freq: Two times a day (BID) | ORAL | Status: DC
  Administered 2019-10-14 – 2019-10-29 (×30): 50 mg via ORAL
  Filled 2019-10-14 (×30): qty 2

## 2019-10-14 NOTE — Progress Notes (Signed)
Occupational Therapy Session Note  Patient Details  Name: Alison Whitehead MRN: [id number] Date of Birth: Oct 15, 1950  Today's Date: 10/14/2019 OT Individual Time: 1300-1400 OT Individual Time Calculation (min): 60 min   OT Individual Time: 1500-1530 OT Individual Time Calculation (min): 30 min  Short Term Goals: Week 1:  OT Short Term Goal 1 (Week 1): Pt will transfer to toilet wiht CGA OT Short Term Goal 2 (Week 1): Pt will groomin in standing wiht S OT Short Term Goal 3 (Week 1): Pt will complete toileting with CGA  Skilled Therapeutic Interventions/Progress Updates:  Session 1: Patient met sitting in recliner in agreement with OT treatment session. Toilet transfer via stand-pivot to [facility] over toilet with CGA and use of RW. Patient continues to demonstrate increased safety awareness for functional transfers/mobility with use of RW. Toileting/hygiene/clothing management with CGA for hygiene in standing. Patient declined shower with preference for sponge bathing seated at sink level. Patient engaged in ADL item retrieval with use of walker bag and CGA. Seated at sink level, patient performed UB bath/dress with I and LB bath/dress with supervision A with LUE supported on sink surface. Patient left seated in recliner with call bell within reach and all needs met.    Session 2: Patient received sitting in recliner in agreement with OT treatment session. Skilled OT focused on functional transfers, activity tolerance, and safety awareness with use of DME as detailed below. Patient ambulated to dayroom with use of RW. Unsupported dynamic standing balance activity with the Wii with patient requiring seated rest breaks after ~10 minutes of activity secondary to fatigue. Patient ambulated back to room with CGA/supervision A. Patient left seated in recliner with call bell within reach and all needs met.   Therapy Documentation Precautions:  Precautions Precautions: Fall, Other (comment) Precaution  Comments: watch BP (MD parameters <140/80 and was orthostatic at eval); can be impulsive Restrictions Weight Bearing Restrictions: No Pain:  Therapy/Group: Individual Therapy  Audrey Thull R Howerton-Davis 10/14/2019, 7:42 AM

## 2019-10-14 NOTE — Progress Notes (Signed)
Nutrition Follow-up  DOCUMENTATION CODES:   Not applicable  INTERVENTION:   Continue Ensure Enlive po BID, each supplement provides 350 kcal and 20 grams of protein  Continue snacks TID  Continue MVI daily   NUTRITION DIAGNOSIS:   Increased nutrient needs related to other (see comment)(therapies) as evidenced by estimated needs.  Ongoing.  GOAL:   Patient will meet greater than or equal to 90% of their needs  Progressing.  MONITOR:   PO intake, Supplement acceptance, Weight trends, Labs  REASON FOR ASSESSMENT:   Malnutrition Screening Tool    ASSESSMENT:   Pt with a PMH of HTN, IBS, OA bilateral knees, cocaine abuse who was admitted on 10/01/19 with onset of weakness and dizziness followed by N/V. UDS positive for opiates and cocaine. 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  CT abdomen pelvis showed enlarged fibroid uterus with fairly extensive diverticulosis. Pt [facility] to [facility]06/[facility].  Discussed pt with RN.   Pt reports appetite is good, but states she doesn't like the food. Pt reports she does like the Ensure and snacks she is receiving.   PO intake: 10-75% x last 6 recorded meals (55% average intake)  Admit weight: 84kg Current weight:85.5 kg  Medications reviewed and include: Ensure Enlive BID, SSI, MVI, Miralax, Senokot-S  Labs reviewed.   Diet Order:   Diet Order            Diet heart healthy/carb modified Room service appropriate? Yes; Fluid consistency: Thin  Diet effective now              EDUCATION NEEDS:   No education needs have been identified at this time  Skin:  Skin Assessment: Reviewed RN Assessment  Last BM:  3/11  Height:   Ht Readings from Last 1 Encounters:  10/06/19 5\' 7"  (1.702 m)    Weight:   Wt  Readings from Last 1 Encounters:  10/13/19 85.5 kg    BMI:  Body mass index is 29.52 kg/m.  Estimated Nutritional Needs:   Kcal:  1700-1900  Protein:  85-95 grams  Fluid:  >/= 1.7L/d   Larkin Ina, MS, RD, LDN RD pager number and weekend/on-call pager number located in [place].

## 2019-10-14 NOTE — Progress Notes (Signed)
Speech Language Pathology Weekly Progress and Session Note  Patient Details  Name: Alison Whitehead MRN: [id number] Date of Birth: [birth year]  Beginning of progress report period: October 07, 2019 End of progress report period: October 14, 2019  Today's Date: 10/14/2019 SLP Individual Time: 2446-2863 SLP Individual Time Calculation (min): 40 min  Short Term Goals: Week 1: SLP Short Term Goal 1 (Week 1): Pt will demonstrate ability to selectively attend to functional tasks for 15 minute intervals with Min A cues for redirection. SLP Short Term Goal 1 - Progress (Week 1): Met SLP Short Term Goal 2 (Week 1): Pt will demonstrate ability to problem solving during semi-complex to complex tasks with Min A verbal/visual cues. SLP Short Term Goal 2 - Progress (Week 1): Met SLP Short Term Goal 3 (Week 1): Pt will demonstrate ability to recall new and daily information with Min A for use of compensatory memory strategies. SLP Short Term Goal 3 - Progress (Week 1): Met SLP Short Term Goal 4 (Week 1): Pt will demonstrate organizational strategies during functional tasks with Min A verbal/visual cues. SLP Short Term Goal 4 - Progress (Week 1): Met SLP Short Term Goal 5 (Week 1): Pt will demonstrate intellectual awareness by stating at least 1 physical and 1 cognitive impairment with Mod A verbal/visual cues. SLP Short Term Goal 5 - Progress (Week 1): Met    New Short Term Goals: Week 2: SLP Short Term Goal 1 (Week 2): STGs=LTGs due to ELOS  Weekly Progress Updates: Patient has made excellent gains and has met 5 of 5 STGs this reporting period. Currently, patient requires overall Min A multimodal cues for awareness of errors during functional tasks, recall of daily information with use of compensatory strategies, selective attention and semi-complex problem solving with use of organizational strategies. Patient and family education ongoing. Patient would benefit from continued SLP intervention to  maximize her cognitive functioning and overall functional independence prior to discharge.      Intensity: Minumum of 1-2 x/day, 30 to 90 minutes Frequency: 3 to 5 out of 7 days Duration/Length of Stay: 3/17 Treatment/Interventions: Cognitive remediation/compensation;Internal/external aids;Cueing hierarchy;Functional tasks;Patient/family education;Therapeutic Activities;Environmental controls   Daily Session  Skilled Therapeutic Interventions:  Skilled treatment session focused on cognitive goals. Upon arrival, patient reported she was lethargic and didn't sleep well the previous night. Patient requested to use the bathroom and performed all self-care tasks with supervision but Min verbal cues for safety with RW. SLP facilitated session by providing Mod A verbal cues for patient to utilize associations to maximize recall of novel information during a mildly complex task. Patient also required Mod verbal cues to recall information after an ~10 minute delay. Patient left upright in bed with alarm on and all needs within reach. Continue with current plan of care.      Pain Pain Assessment Pain Score: 0-No pain  Therapy/Group: Individual Therapy  Liat Mayol 10/14/2019, 6:44 AM

## 2019-10-14 NOTE — Progress Notes (Signed)
Physical Therapy Weekly Progress Note  Patient Details  Name: Alison Whitehead MRN: [id number] Date of Birth: March 26, 1951  Beginning of progress report period: October 07, 2019 End of progress report period: October 14, 2019  Today's Date: 10/14/2019 PT Individual Time: 9450-3888 PT Individual Time Calculation (min): 70 min   Patient has met 3 of 3 short term goals. Pt has made steady progress over last week despite intermittent bouts of orthostasis and dizziness/nausea. Over past few days, this has been much more consistent and she has been able to participate more fully in therapies. When pt's BP is managed, she is performing all mobility w/ CGA-close supervision including gait up to 150' and negotiating 4 steps w/ B rails. This therapist has extensively educated pt on her risk of falling especially in light of Berg score (32/56) and fluctuating BP. When BP is low, pt feels she is about to lose her balance and requests to sit or lay down. Pt also w/ memory impairments, decreased safety awareness, and denial of deficits in general; thus therapy team continues to recommend 24/7 supervision at d/c.   Patient continues to demonstrate the following deficits muscle weakness, decreased cardiorespiratoy endurance, decreased motor planning, decreased problem solving, decreased safety awareness and decreased memory and decreased standing balance and decreased balance strategies and therefore will continue to benefit from skilled PT intervention to increase functional independence with mobility.  Patient progressing toward long term goals.  Continue plan of care.  PT Short Term Goals Week 1:  PT Short Term Goal 1 (Week 1): Will be able to transfer with LRAD and MinA PT Short Term Goal 1 - Progress (Week 1): Met PT Short Term Goal 2 (Week 1): Will tolerate gait training at least 138f with min guard and LRAD PT Short Term Goal 2 - Progress (Week 1): Met PT Short Term Goal 3 (Week 1): Will be able to  maintain dynamic balance with MinA with no UE support PT Short Term Goal 3 - Progress (Week 1): Met Week 2:  PT Short Term Goal 1 (Week 2): =LTGs due to ELOS  Skilled Therapeutic Interventions/Progress Updates:   Pt in supine and agreeable to therapy, denies pain. Supine>sit w/ supervision. BP 117/84 w/o TEDs and pt denies dizziness. Ambulated to/from therapy gym w/ close supervision using RW, pt continued to deny dizziness and BP 128/81 after ambulating. Practiced car transfer and furniture transfers in ADL apartment, both performed w/ supervision and verbal cues for technique. Demonstrated curb negotiation w/ RW for community mobility, pt performed w/ CGA and verbal reminders for technique. Pt c/o mild dizziness, BP 127/87 and dizziness resolves w/ brief rest. Worked on ambulation w/o AD for functional balance. Ambulated 633 x2 w/ CGA and verbal/tactile cues for upright posture. Pt believes this is baseline. NuStep 5 min x2 @ level 3 w/ all extremities to work on endurance and global strengthening.  Ambulated back to room, performed toilet transfer w/ supervision, pt continent of bowel and bladder. Ended session in recliner, all needs in reach.   Therapy Documentation Precautions:  Precautions Precautions: Fall, Other (comment) Precaution Comments: watch BP (MD parameters <140/80 and was orthostatic at eval); can be impulsive Restrictions Weight Bearing Restrictions: No Vital Signs: Therapy Vitals Pulse Rate: 79 BP: 117/71 Pain: Pain Assessment Pain Scale: 0-10 Pain Score: 0-No pain  Therapy/Group: Individual Therapy  Lexxi Koslow KClent Demark3/07/2020, 11:25 AM

## 2019-10-14 NOTE — Progress Notes (Signed)
Social Work Patient ID: Alison Whitehead, female   DOB: Oct 31, 1950, 69 y.o.   MRN: [id number]    SW followed up with pt sister Alison Whitehead [phone]) to discuss if there was a decision made with regard to placement or would they make a decision after observing pt in family education tomorrow. Reports they will make decision tomorrow after family edu. SW asked if they were able to review list that was left. SW informed list remains at front desk for pt dtr. She intends to follow-up with [name] to inform to pick up and they will review and provide preferred SNF.   Loralee Pacas, MSW, [place] Office: [phone] Cell: [phone] Fax: [phone]

## 2019-10-14 NOTE — Progress Notes (Signed)
[facility] PHYSICAL MEDICINE & REHABILITATION PROGRESS NOTE   Subjective/Complaints: Alison Whitehead continues to have nausea this morning. EKG was performed and dose show prolonged qTC. Zofran discontinued.  Meclizine was scheduled yesterday for dizziness. She has not noticed huge difference with this.   ROS: Denies CP, SOB, N/V/D  Objective:   No results found. No results for input(s): WBC, HGB, HCT, PLT in the last 72 hours. No results for input(s): NA, K, CL, CO2, GLUCOSE, BUN, CREATININE, CALCIUM in the last 72 hours.  Intake/Output Summary (Last 24 hours) at 10/14/2019 1057 Last data filed at 10/14/2019 0730 Gross per 24 hour  Intake 480 ml  Output --  Net 480 ml     Physical Exam: Vital Signs Blood pressure 117/71, pulse 79, temperature 98.3 F (36.8 C), temperature source Oral, resp. rate 18, height 5\' 7"  (1.702 m), weight 85.5 kg, SpO2 99 %. Constitutional: No distress . Vital signs reviewed. Sitting up in bed, appears nauseous.  HENT: Normocephalic.  Atraumatic. Eyes: EOMI. No discharge. Cardiovascular: No JVD. Respiratory: Normal effort.  No stridor. GI: Non-distended. Skin: Warm and dry.  Intact. Psych: Normal mood.  Normal behavior. Musc: No edema in extremities.  No tenderness in extremities. Participated well with PT today.  Neuro: Alert Motor: Grossly 4+/5 throughout   Assessment/Plan: 1. Functional deficits secondary to right posterior pontine infarct which require 3+ hours per day of interdisciplinary therapy in a comprehensive inpatient rehab setting.  Physiatrist is providing close team supervision and 24 hour management of active medical problems listed below.  Physiatrist and rehab team continue to assess barriers to discharge/monitor patient progress toward functional and medical goals  Care Tool:  Bathing    Body parts bathed by patient: Right arm, Left arm, Chest, Abdomen, Front perineal area, Buttocks, Right upper leg, Left upper leg, Right  lower leg, Left lower leg, Face         Bathing assist Assist Level: Supervision/Verbal cueing(Sitting on TTB)     Upper Body Dressing/Undressing Upper body dressing   What is the patient wearing?: Pull over shirt    Upper body assist Assist Level: Supervision/Verbal cueing    Lower Body Dressing/Undressing Lower body dressing      What is the patient wearing?: Pants, Underwear/pull up     Lower body assist Assist for lower body dressing: Contact Guard/Touching assist     Toileting Toileting Toileting Activity did not occur (Clothing management and hygiene only): N/A (no void or bm)  Toileting assist Assist for toileting: Contact Guard/Touching assist     Transfers Chair/bed transfer  Transfers assist     Chair/bed transfer assist level: Supervision/Verbal cueing     Locomotion Ambulation   Ambulation assist   Ambulation activity did not occur: Safety/medical concerns(orthostatic)  Assist level: Supervision/Verbal cueing Assistive device: Walker-rolling Max distance: 150'   Walk 10 feet activity   Assist  Walk 10 feet activity did not occur: Safety/medical concerns(orthostatic)  Assist level: Supervision/Verbal cueing Assistive device: Walker-rolling   Walk 50 feet activity   Assist Walk 50 feet with 2 turns activity did not occur: Safety/medical concerns(orthostatic)  Assist level: Supervision/Verbal cueing Assistive device: Walker-rolling    Walk 150 feet activity   Assist Walk 150 feet activity did not occur: Safety/medical concerns(orthostatic)  Assist level: Supervision/Verbal cueing Assistive device: Walker-rolling    Walk 10 feet on uneven surface  activity   Assist Walk 10 feet on uneven surfaces activity did not occur: Safety/medical concerns(orthostatic)         Wheelchair  Assist Will patient use wheelchair at discharge?: No      Wheelchair assist level: Moderate Assistance - Patient 50 - 74% Max  wheelchair distance: 48ft    Wheelchair 50 feet with 2 turns activity    Assist    Wheelchair 50 feet with 2 turns activity did not occur: Safety/medical concerns(orthostatic)       Wheelchair 150 feet activity     Assist  Wheelchair 150 feet activity did not occur: Safety/medical concerns(orthostatic)       Blood pressure 117/71, pulse 79, temperature 98.3 F (36.8 C), temperature source Oral, resp. rate 18, height 5\' 7"  (1.702 m), weight 85.5 kg, SpO2 99 %.  Medical Problem List and Plan: 1.  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   Team conference 3/9. Sister should not have to provide much physical assistance at home, just supervision due to poor safety awareness. Sister is Therapist, sports.  2.  Antithrombotics: -DVT/anticoagulation:  Pharmaceutical: Lovenox             -antiplatelet therapy: ASA 3. Persistent HA/Pain Management: Will change oxycodone to ultram prn. Add topamax to treat HA.             Appears controlled with medications on 3/8   3/9: Will start Topiramate 25mg  daily.   3/10: No headache today.   3/12: Has some headache this morning. Will increase Topiramate to 50mg  BID- this does not prolong qTC.    Continue with above regimen   4. Mood: LCSW to follow for evaluation and support.              -antipsychotic agents: N/A. Appreciate neuropsych evaluation.  5. Neuropsych: This patient is not capable of making decisions on her own behalf. 6. Skin/Wound Care: Routine pressure relief measures.  7. Fluids/Electrolytes/Nutrition: encourage po 8. HTN: Monitor BP   HCTZ, amlodipine DC'd  Norvasc, coreg bid, Bidil, lisinopril, Zestril. Has not taken medications for months. Will need to monitor for hypotension as medications get to steady state.             -need to keep BP <140/80 per Neurology   Lisinopril decreased to 10 on 3/8.   +  Orthostasis on 3/8 - ACE wraps/Abd binder as necessary  3/9: lisinopril decreased to 5mg .   3/10: Currently well controlled; maintain current dosage. Continue ACE and abdominal binder during therapy.   3/11: Well controlled thorughout therapy sessions yesterday  Encourage fluids 9. OA bilateral knees: continue Voltaren gel to knees qid. Avoid narcotics due to ongoing polysubstance abuse.  10. AKI:   See #8  -encourage fluids  Creatinine 1.42 on 3/8  IVF nightly x2 nights started 3/8, echocardiogram with ejection fraction of 55 to 60% 11. Prediabetes: Hgb A1c- 6.2. Will monitor BS ac/hs and have RD educate on CM diet.    Labile on 3/8, monitor trend  3/9: well controlled.  12. Nausea: scopolamine patch, prn compazine. Will add prn zofran as well. EKG tomorrow morning to monitor qTC.   3/12: continues to be nauseous this morning. Will not increase medications further given qTC prolongation.  13. Dizziness: scheduled meclizine 14. qTC prlonged on EKG today. Zofran d/ced.   LOS: 8 days A FACE TO FACE EVALUATION WAS PERFORMED  Alison Whitehead 10/14/2019, 10:57 AM

## 2019-10-15 ENCOUNTER — Encounter (HOSPITAL_COMMUNITY): Payer: Medicare HMO | Admitting: Speech Pathology

## 2019-10-15 ENCOUNTER — Encounter (HOSPITAL_COMMUNITY): Payer: Medicare HMO

## 2019-10-15 ENCOUNTER — Ambulatory Visit (HOSPITAL_COMMUNITY): Payer: Medicare HMO | Admitting: Physical Therapy

## 2019-10-15 LAB — GLUCOSE, CAPILLARY
Glucose-Capillary: 86 mg/dL (ref 70–99)
Glucose-Capillary: 96 mg/dL (ref 70–99)
Glucose-Capillary: 138 mg/dL — ABNORMAL HIGH (ref 70–99)
Glucose-Capillary: 118 mg/dL — ABNORMAL HIGH (ref 70–99)

## 2019-10-15 MED ORDER — NON FORMULARY: 6.0000 mg | Freq: Every day | Status: DC

## 2019-10-15 MED ORDER — MELATONIN 3 MG PO TABS
6.0000 mg | ORAL_TABLET | Freq: Every day | ORAL | Status: DC
  Administered 2019-10-15 – 2019-10-28 (×12): 6 mg via ORAL
  Filled 2019-10-15 (×14): qty 2

## 2019-10-15 NOTE — Progress Notes (Signed)
Alison Whitehead PHYSICAL MEDICINE & REHABILITATION PROGRESS NOTE   Subjective/Complaints: M Pt reports no pain except normal back pain- slept too much when took trazodone the other day- doesn't want to today since family is coming.   Slept OK but not great without sleeping medicine.     ROS:  Pt denies SOB, abd pain, CP, N/V/C/D, and vision changes   Objective:   No results found. No results for input(s): WBC, HGB, HCT, PLT in the last 72 hours. No results for input(s): NA, K, CL, CO2, GLUCOSE, BUN, CREATININE, CALCIUM in the last 72 hours.  Intake/Output Summary (Last 24 hours) at 10/15/2019 1636 Last data filed at 10/15/2019 0700 Gross per 24 hour  Intake 700 ml  Output -  Net 700 ml     Physical Exam: Vital Signs Blood pressure 110/77, pulse 94, temperature (!) 97.4 F (36.3 C), temperature source Oral, resp. rate 17, height 5\' 7"  (1.702 m), weight 85.5 kg, SpO2 100 %. Constitutional: pt sitting up in chair, appropriate, NAD- doesn't appear nauseated HENT: Normocephalic.  Atraumatic. Eyes: conjugate gaze Cardiovascular: no JVD; RRR Respiratory: CTA B/L GI: Non-distended. Soft, NT, hypoactive BS Skin: Warm and dry.  Intact. Psych: Normal mood.  Normal behavior. Musc: No edema in extremities.  No tenderness in extremities. Neuro: Alert Motor: Grossly 4+/5 throughout   Assessment/Plan: 1. Functional deficits secondary to right posterior pontine infarct which require 3+ hours per day of interdisciplinary therapy in a comprehensive inpatient rehab setting.  Physiatrist is providing close team supervision and 24 hour management of active medical problems listed below.  Physiatrist and rehab team continue to assess barriers to discharge/monitor patient progress toward functional and medical goals  Care Tool:  Bathing    Body parts bathed by patient: Right arm, Left arm, Chest, Abdomen, Front perineal area, Buttocks, Right upper leg, Left upper leg, Right lower leg, Left  lower leg, Face         Bathing assist Assist Level: Supervision/Verbal cueing     Upper Body Dressing/Undressing Upper body dressing   What is the patient wearing?: Pull over shirt    Upper body assist Assist Level: Independent with assistive device    Lower Body Dressing/Undressing Lower body dressing      What is the patient wearing?: Pants, Underwear/pull up     Lower body assist Assist for lower body dressing: Contact Guard/Touching assist     Toileting Toileting Toileting Activity did not occur (Clothing management and hygiene only): N/A (no void or bm)  Toileting assist Assist for toileting: Contact Guard/Touching assist     Transfers Chair/bed transfer  Transfers assist     Chair/bed transfer assist level: Supervision/Verbal cueing     Locomotion Ambulation   Ambulation assist   Ambulation activity did not occur: Safety/medical concerns(orthostatic)  Assist level: Supervision/Verbal cueing Assistive device: Walker-rolling Max distance: 150'   Walk 10 feet activity   Assist  Walk 10 feet activity did not occur: Safety/medical concerns(orthostatic)  Assist level: Supervision/Verbal cueing Assistive device: Walker-rolling   Walk 50 feet activity   Assist Walk 50 feet with 2 turns activity did not occur: Safety/medical concerns(orthostatic)  Assist level: Supervision/Verbal cueing Assistive device: Walker-rolling    Walk 150 feet activity   Assist Walk 150 feet activity did not occur: Safety/medical concerns(orthostatic)  Assist level: Supervision/Verbal cueing Assistive device: Walker-rolling    Walk 10 feet on uneven surface  activity   Assist Walk 10 feet on uneven surfaces activity did not occur: Safety/medical concerns(orthostatic)  Wheelchair     Assist Will patient use wheelchair at discharge?: No      Wheelchair assist level: Moderate Assistance - Patient 50 - 74% Max wheelchair distance: 88ft     Wheelchair 50 feet with 2 turns activity    Assist    Wheelchair 50 feet with 2 turns activity did not occur: Safety/medical concerns(orthostatic)       Wheelchair 150 feet activity     Assist  Wheelchair 150 feet activity did not occur: Safety/medical concerns(orthostatic)       Blood pressure 110/77, pulse 94, temperature (!) 97.4 F (36.3 C), temperature source Oral, resp. rate 17, height 5\' 7"  (1.702 m), weight 85.5 kg, SpO2 100 %.  Medical Problem List and Plan: 1.  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   Team conference 3/9. Sister should not have to provide much physical assistance at home, just supervision due to poor safety awareness. Sister is Therapist, sports.  2.  Antithrombotics: -DVT/anticoagulation:  Pharmaceutical: Lovenox             -antiplatelet therapy: ASA 3. Persistent HA/Pain Management: Will change oxycodone to ultram prn. Add topamax to treat HA.             Appears controlled with medications on 3/8   3/9: Will start Topiramate 25mg  daily.   3/10: No headache today.   3/12: Has some headache this morning. Will increase Topiramate to 50mg  BID- this does not prolong qTC.    Continue with above regimen   4. Mood: LCSW to follow for evaluation and support.              -antipsychotic agents: N/A. Appreciate neuropsych evaluation.  5. Neuropsych: This patient is not capable of making decisions on her own behalf. 6. Skin/Wound Care: Routine pressure relief measures.  7. Fluids/Electrolytes/Nutrition: encourage po 8. HTN: Monitor BP   HCTZ, amlodipine DC'd  Norvasc, coreg bid, Bidil, lisinopril, Zestril. Has not taken medications for months. Will need to monitor for hypotension as medications get to steady state.             -need to keep BP <140/80 per Neurology   Lisinopril decreased to 10 on 3/8.   + Orthostasis on 3/8 - ACE  wraps/Abd binder as necessary  3/9: lisinopril decreased to 5mg .   3/10: Currently well controlled; maintain current dosage. Continue ACE and abdominal binder during therapy.   3/11: Well controlled thorughout therapy sessions yesterday  Encourage fluids  3/13- BP 110/77- hanging out in 110s- no Sx's 9. OA bilateral knees: continue Voltaren gel to knees qid. Avoid narcotics due to ongoing polysubstance abuse.  10. AKI:   See #8  -encourage fluids  Creatinine 1.42 on 3/8  IVF nightly x2 nights started 3/8, echocardiogram with ejection fraction of 55 to 60%  3/13- needs labs Monday- will check they are ordered 11. Prediabetes: Hgb A1c- 6.2. Will monitor BS ac/hs and have RD educate on CM diet.    Labile on 3/8, monitor trend  3/9: well controlled.  12. Nausea: scopolamine patch, prn compazine. Will add prn zofran as well. EKG tomorrow morning to monitor qTC.   3/12: continues to be nauseous this morning. Will not increase medications further given qTC prolongation.   3/13- didn't mention nausea- cont' regimen 13. Dizziness: scheduled meclizine 14. qTC prlonged on EKG today. Zofran d/ced.  15/ Insomnia  3/13- too sedated with trazodone  25 mg- will try melatonin 6 mg QHS.   LOS: 9 days A FACE TO FACE EVALUATION WAS PERFORMED  Platon Arocho 10/15/2019, 4:36 PM

## 2019-10-15 NOTE — Progress Notes (Signed)
Occupational Therapy Session Note  Patient Details  Name: Alison Whitehead MRN: [id number] Date of Birth: 08-29-1950  Today's Date: 10/15/2019 OT Individual Time: 1000-1055 OT Individual Time Calculation (min): 55 min    Short Term Goals: Week 1:  OT Short Term Goal 1 (Week 1): Pt will transfer to toilet wiht CGA OT Short Term Goal 2 (Week 1): Pt will groomin in standing wiht S OT Short Term Goal 3 (Week 1): Pt will complete toileting with CGA  Skilled Therapeutic Interventions/Progress Updates:    Pt received EOB with daughter and sister present for education. OT educates on role/purpose of OT, safety awareness deficits, memory deficits, impulsivity, RW management, TTB sequencing, energy conservation (handout provided), theraband ex (handout provided), sitting for dressing tasks, upright posture/proximity to RW and supervision of functional mobility. Family provided supervision of toilet and TTB transfers requring no VC. Reviewed potential use of RW tray to assist with transporting items/energy conservation. Family with no questions and demo good carryover of ed. Exited session with pt seated in bed, family present, and awaiting PT arrival.   Therapy Documentation Precautions:  Precautions Precautions: Fall, Other (comment) Precaution Comments: watch BP (MD parameters <140/80 and was orthostatic at eval); can be impulsive Restrictions Weight Bearing Restrictions: No General:   Vital Signs: Therapy Vitals Pulse Rate: (!) 56 BP: (!) 140/99 Pain: Pain Assessment Pain Score: 5  ADL: ADL Eating: Set up Grooming: Minimal assistance(standing) Upper Body Bathing: Supervision/safety Where Assessed-Upper Body Bathing: Sitting at sink, Wheelchair Lower Body Bathing: Minimal assistance Where Assessed-Lower Body Bathing: Wheelchair, Sitting at sink Upper Body Dressing: Supervision/safety Where Assessed-Upper Body Dressing: Sitting at sink, Wheelchair Lower Body Dressing: Minimal  assistance Where Assessed-Lower Body Dressing: Sitting at sink, Standing at sink, Wheelchair Toileting: Minimal assistance Where Assessed-Toileting: Bedside Commode Toilet Transfer: Minimal assistance Toilet Transfer Method: Insurance claims handler Equipment: Bedside commode(over toileti wiht RW) Vision   Perception    Praxis   Exercises:   Other Treatments:     Therapy/Group: Individual Therapy  Tonny Branch 10/15/2019, 10:56 AM

## 2019-10-15 NOTE — Progress Notes (Signed)
Physical Therapy Session Note  Patient Details  Name: Alison Whitehead MRN: [id number] Date of Birth: 1950/12/10  Today's Date: 10/15/2019 PT Individual Time: 1100-1116 PT Individual Time Calculation (min): 16 min   Short Term Goals: Week 2:  PT Short Term Goal 1 (Week 2): =LTGs due to ELOS  Skilled Therapeutic Interventions/Progress Updates:    Pt received seated EOB in room with family present for hands on family education session. Pt is at Supervision level for transfers with RW with v/c for safety. Pt is able to ambulate up to 150 ft with RW at Supervision level with cues for safety as pt easily distracted by environment and lets go of RW. Pt's family informed that therapy is recommending 24/7 Supervision for patient due to decreased safety awareness and fall risk. Pt able to navigate 12 stairs with 1-2 handrails at Supervision level. Pt is able to navigate a 6" curb step with RW with Supervision with mod cueing for safe RW management as pt is impulsive at times. Pt's family understanding of current assist level that pt requires for safe functional mobility. Pt left seated EOB in room with needs in reach and family present for next session with SLP.  Therapy Documentation Precautions:  Precautions Precautions: Fall, Other (comment) Precaution Comments: watch BP (MD parameters <140/80 and was orthostatic at eval); can be impulsive Restrictions Weight Bearing Restrictions: No    Therapy/Group: Individual Therapy   Excell Seltzer, PT, DPT  10/15/2019, 12:39 PM

## 2019-10-15 NOTE — Progress Notes (Signed)
Speech Language Pathology Daily Session Note  Patient Details  Name: Alison Whitehead MRN: [id number] Date of Birth: Dec 03, 1950  Today's Date: 10/15/2019 SLP Individual Time: [id number] SLP Individual Time Calculation (min): 30 min  Short Term Goals: Week 2: SLP Short Term Goal 1 (Week 2): STGs=LTGs due to ELOS  Skilled Therapeutic Interventions: Patient was seen for caregiver education with daughter and sister. Education was provided regarding external compensatory memory strategies, safety awareness/impulsivity, and how to organize information. Further education was provided regarding recommendations for a pill box to assist with medication management. Educated patient and daughter on recommendations for supervision and assistance with medication management at discharge. Questions were answered regarding where pill boxes can be purchased. Daughter reports she will purchase patient a pill box as well as a calendar prior to discharge. Patient, daughter, and sister verbalized understanding regarding recommendations. Patient and family voiced no further questions. Patient left upright in bed with family present.  Pain Pain Assessment Pain Scale: 0-10 Pain Score: 0-No pain  Therapy/Group: Individual Therapy  Cristy Folks 10/15/2019, 11:55 AM

## 2019-10-16 ENCOUNTER — Inpatient Hospital Stay (HOSPITAL_COMMUNITY): Payer: Medicare HMO

## 2019-10-16 LAB — GLUCOSE, CAPILLARY
Glucose-Capillary: 102 mg/dL — ABNORMAL HIGH (ref 70–99)
Glucose-Capillary: 99 mg/dL (ref 70–99)
Glucose-Capillary: 88 mg/dL (ref 70–99)
Glucose-Capillary: 146 mg/dL — ABNORMAL HIGH (ref 70–99)

## 2019-10-16 NOTE — Progress Notes (Signed)
Physical Therapy Session Note  Patient Details  Name: Alison Whitehead MRN: [id number] Date of Birth: 1950/12/07  Today's Date: 10/16/2019 PT Individual Time: 1000-1100 PT Individual Time Calculation (min): 60 min   Short Term Goals: Week 1:  PT Short Term Goal 1 (Week 1): Will be able to transfer with LRAD and MinA PT Short Term Goal 1 - Progress (Week 1): Met PT Short Term Goal 2 (Week 1): Will tolerate gait training at least 169f with min guard and LRAD PT Short Term Goal 2 - Progress (Week 1): Met PT Short Term Goal 3 (Week 1): Will be able to maintain dynamic balance with MinA with no UE support PT Short Term Goal 3 - Progress (Week 1): Met Week 2:  PT Short Term Goal 1 (Week 2): =LTGs due to ELOS Week 3:     Skilled Therapeutic Interventions/Progress Updates:     PAIN  Denies pain this am  Pt supine initially and agreeabe to treatment.  Supine to long sit to sitting on EOB w/supervision.  Pt requesting to use BR.  Ted hose donned by therapist for time efficiency.  STS w/supervision and gait 161fto commode incuding transition at doorway w/supervision.  Commode tranfer and management of clothing, and hygiene all performed w/supervision for safety.  Pt demonstrates good static balance while raising/lowering pants and squatting for hygiene.  Mild impulsivity/decreased safety awareness.  Gait 1530fo bed w/supervision.  Rests in sitting. Mild SOB.  Gait 21f46f sink where she stood and washed hands w/supervision.  Gait 150ft68fW w/supervision including 3 turns, turns to sit on mat w/supervision.  Rests in sitting due to mild SOB.  dyanmic balance: Standing alernating tapping 5in step first w/bilat UE support on RW w/supervision, then w/single UE support on L w/cga and cues for safe pace.ress in sitting for recovery then repeats using only RUE, again cga and cues. Standing w/Rw reaching w/max excursions to L and R lower than waist level to obtain horseshoes, returns upright and  tosses to target 8ft a47fd all w/supervision.  Stairs:  Ascends descends 3inch and 5inch steps w/2 rails w/supervision and cues for safe pace., repeated x 2 w/rest in sitting between efforts. Gait 150ft b60fto room w/RW and supervision.   Pt requesting therapist call daughter to discuss if her participation in "unpacking" her new apartment would be safe.  Discussed performing light activities from chair level such as unpacking clothes seated at dresserFreescale Semiconductorut educated pt re fall risk when attempting dynamic acitivities without UE support as would be needed w/unpacking.  Pt voiced understanding and therapist also discussed via phone call w/daughter who agreed and voiced understanding of safety issues w/standing balance/risk of falls, unpacking from chair level only.   Pt transferred to recliner w/supervision.  Pt left oob in recliner w/chair alarm set and needs in reach.     Therapy Documentation Precautions:  Precautions Precautions: Fall, Other (comment) Precaution Comments: watch BP (MD parameters <140/80 and was orthostatic at eval); can be impulsive Restrictions Weight Bearing Restrictions: No    Therapy/Group: Individual Therapy  BarbaraCallie [email or web address], 12:35 PM

## 2019-10-16 NOTE — Progress Notes (Signed)
[place] PHYSICAL MEDICINE & REHABILITATION PROGRESS NOTE   Subjective/Complaints:  Pt reports slept so much better- but had no hangover like had with trazodone- Back pain is chronic- has daily.   No issues otherwise.   ROS:   Pt denies SOB, abd pain, CP, N/V/C/D, and vision changes    Objective:   No results found. No results for input(s): WBC, HGB, HCT, PLT in the last 72 hours. No results for input(s): NA, K, CL, CO2, GLUCOSE, BUN, CREATININE, CALCIUM in the last 72 hours.  Intake/Output Summary (Last 24 hours) at 10/16/2019 1329 Last data filed at 10/16/2019 0800 Gross per 24 hour  Intake 410 ml  Output --  Net 410 ml     Physical Exam: Vital Signs Blood pressure 128/85, pulse 75, temperature 98.4 F (36.9 C), resp. rate 18, height 5\' 7"  (1.702 m), weight 85.5 kg, SpO2 93 %. Constitutional: pt sitting up in bed watching TV, NAD HENT: Normocephalic.  Atraumatic. Eyes: EOMI B/L Cardiovascular:RRR; no MR/G, no JVD Respiratory: CTA B/L- no W/R/R- good air movement GI: Soft, NT, ND, (+)BS  Skin: Warm and dry.  Intact. Psych: Normal mood.  Normal behavior. Musc: No edema in extremities.  No tenderness in extremities. Neuro: Alert Motor: Grossly 4+/5 throughout   Assessment/Plan: 1. Functional deficits secondary to right posterior pontine infarct which require 3+ hours per day of interdisciplinary therapy in a comprehensive inpatient rehab setting.  Physiatrist is providing close team supervision and 24 hour management of active medical problems listed below.  Physiatrist and rehab team continue to assess barriers to discharge/monitor patient progress toward functional and medical goals  Care Tool:  Bathing    Body parts bathed by patient: Right arm, Left arm, Chest, Abdomen, Front perineal area, Buttocks, Right upper leg, Left upper leg, Right lower leg, Left lower leg, Face         Bathing assist Assist Level: Supervision/Verbal cueing     Upper Body  Dressing/Undressing Upper body dressing   What is the patient wearing?: Pull over shirt    Upper body assist Assist Level: Independent with assistive device    Lower Body Dressing/Undressing Lower body dressing      What is the patient wearing?: Pants, Underwear/pull up     Lower body assist Assist for lower body dressing: Contact Guard/Touching assist     Toileting Toileting Toileting Activity did not occur (Clothing management and hygiene only): N/A (no void or bm)  Toileting assist Assist for toileting: Contact Guard/Touching assist     Transfers Chair/bed transfer  Transfers assist     Chair/bed transfer assist level: Supervision/Verbal cueing     Locomotion Ambulation   Ambulation assist   Ambulation activity did not occur: Safety/medical concerns(orthostatic)  Assist level: Supervision/Verbal cueing Assistive device: Walker-rolling Max distance: 150   Walk 10 feet activity   Assist  Walk 10 feet activity did not occur: Safety/medical concerns(orthostatic)  Assist level: Supervision/Verbal cueing Assistive device: Walker-rolling   Walk 50 feet activity   Assist Walk 50 feet with 2 turns activity did not occur: Safety/medical concerns(orthostatic)  Assist level: Supervision/Verbal cueing Assistive device: Walker-rolling    Walk 150 feet activity   Assist Walk 150 feet activity did not occur: Safety/medical concerns(orthostatic)  Assist level: Supervision/Verbal cueing Assistive device: Walker-rolling    Walk 10 feet on uneven surface  activity   Assist Walk 10 feet on uneven surfaces activity did not occur: Safety/medical concerns(orthostatic)         Wheelchair  Assist Will patient use wheelchair at discharge?: No      Wheelchair assist level: Moderate Assistance - Patient 50 - 74% Max wheelchair distance: 26ft    Wheelchair 50 feet with 2 turns activity    Assist    Wheelchair 50 feet with 2 turns activity  did not occur: Safety/medical concerns(orthostatic)       Wheelchair 150 feet activity     Assist  Wheelchair 150 feet activity did not occur: Safety/medical concerns(orthostatic)       Blood pressure 128/85, pulse 75, temperature 98.4 F (36.9 C), resp. rate 18, height 5\' 7"  (1.702 m), weight 85.5 kg, SpO2 93 %.  Medical Problem List and Plan: 1.  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   Team conference 3/9. Sister should not have to provide much physical assistance at home, just supervision due to poor safety awareness. Sister is Therapist, sports.  2.  Antithrombotics: -DVT/anticoagulation:  Pharmaceutical: Lovenox 3/14- will con't until d/c- since pt should be walking well enough             -antiplatelet therapy: ASA 3. Persistent HA/Pain Management: Will change oxycodone to ultram prn. Add topamax to treat HA.             Appears controlled with medications on 3/8   3/9: Will start Topiramate 25mg  daily.   3/10: No headache today.   3/12: Has some headache this morning. Will increase Topiramate to 50mg  BID- this does not prolong qTC.   3/14- HA doing well per pt- con't meds   Continue with above regimen   4. Mood: LCSW to follow for evaluation and support.              -antipsychotic agents: N/A. Appreciate neuropsych evaluation.  5. Neuropsych: This patient is not capable of making decisions on her own behalf. 6. Skin/Wound Care: Routine pressure relief measures.  7. Fluids/Electrolytes/Nutrition: encourage po 8. HTN: Monitor BP   HCTZ, amlodipine DC'd  Norvasc, coreg bid, Bidil, lisinopril, Zestril. Has not taken medications for months. Will need to monitor for hypotension as medications get to steady state.             -need to keep BP <140/80 per Neurology   Lisinopril decreased to 10 on 3/8.   + Orthostasis on 3/8 - ACE wraps/Abd binder as  necessary  3/9: lisinopril decreased to 5mg .   3/10: Currently well controlled; maintain current dosage. Continue ACE and abdominal binder during therapy.   3/11: Well controlled thorughout therapy sessions yesterday  Encourage fluids  3/13- BP 110/77- hanging out in 110s- no Sx's  3/14- SBP 110s-120s- doing well con't meds- no hypotension/Sx;s today 9. OA bilateral knees: continue Voltaren gel to knees qid. Avoid narcotics due to ongoing polysubstance abuse.  10. AKI:   See #8  -encourage fluids  Creatinine 1.42 on 3/8  IVF nightly x2 nights started 3/8, echocardiogram with ejection fraction of 55 to 60%  3/13- needs labs Monday- will check they are ordered 11. Prediabetes: Hgb A1c- 6.2. Will monitor BS ac/hs and have RD educate on CM diet.    Labile on 3/8, monitor trend  3/9: well controlled.  12. Nausea: scopolamine patch, prn compazine. Will add prn zofran as well. EKG tomorrow morning to monitor qTC.   3/12: continues to be nauseous this morning. Will not increase medications further given qTC prolongation.   3/13- didn't mention nausea-  cont' regimen 13. Dizziness: scheduled meclizine 14. qTC prlonged on EKG today. Zofran d/ced.  15/ Insomnia  3/13- too sedated with trazodone 25 mg- will try melatonin 6 mg QHS.   3/14- slept great with melatonin- will con't and sugfest after d/c- no hangover affect   LOS: 10 days A FACE TO FACE EVALUATION WAS PERFORMED  Darria Corvera 10/16/2019, 1:29 PM

## 2019-10-17 ENCOUNTER — Inpatient Hospital Stay (HOSPITAL_COMMUNITY): Payer: Medicare HMO | Admitting: Speech Pathology

## 2019-10-17 ENCOUNTER — Inpatient Hospital Stay (HOSPITAL_COMMUNITY): Payer: Medicare HMO | Admitting: Physical Therapy

## 2019-10-17 ENCOUNTER — Inpatient Hospital Stay (HOSPITAL_COMMUNITY): Payer: Medicare HMO | Admitting: Occupational Therapy

## 2019-10-17 LAB — CBC
HCT: 35.7 % — ABNORMAL LOW (ref 36.0–46.0)
Hemoglobin: 11.3 g/dL — ABNORMAL LOW (ref 12.0–15.0)
MCH: 27.4 pg (ref 26.0–34.0)
MCHC: 31.7 g/dL (ref 30.0–36.0)
MCV: 86.7 fL (ref 80.0–100.0)
Platelets: 199 10*3/uL (ref 150–400)
RBC: 4.12 MIL/uL (ref 3.87–5.11)
RDW: 13.3 % (ref 11.5–15.5)
WBC: 5 10*3/uL (ref 4.0–10.5)
nRBC: 0 % (ref 0.0–0.2)

## 2019-10-17 LAB — GLUCOSE, CAPILLARY
Glucose-Capillary: 109 mg/dL — ABNORMAL HIGH (ref 70–99)
Glucose-Capillary: 147 mg/dL — ABNORMAL HIGH (ref 70–99)
Glucose-Capillary: 148 mg/dL — ABNORMAL HIGH (ref 70–99)
Glucose-Capillary: 249 mg/dL — ABNORMAL HIGH (ref 70–99)

## 2019-10-17 LAB — BASIC METABOLIC PANEL
Anion gap: 12 (ref 5–15)
BUN: 28 mg/dL — ABNORMAL HIGH (ref 8–23)
CO2: 22 mmol/L (ref 22–32)
Calcium: 9.1 mg/dL (ref 8.9–10.3)
Chloride: 105 mmol/L (ref 98–111)
Creatinine, Ser: 1.01 mg/dL — ABNORMAL HIGH (ref 0.44–1.00)
GFR calc Af Amer: >60 mL/min (ref >60)
GFR calc non Af Amer: 58 mL/min — ABNORMAL LOW (ref >60)
Glucose, Bld: 95 mg/dL (ref 70–99)
Potassium: 4.1 mmol/L (ref 3.5–5.1)
Sodium: 139 mmol/L (ref 135–145)

## 2019-10-17 MED ORDER — MELATONIN 3 MG PO TABS: 6.0000 mg | ORAL_TABLET | Freq: Every day | ORAL | 0 refills | Status: DC

## 2019-10-17 MED ORDER — ACETAMINOPHEN 325 MG PO TABS: 325.0000 mg | ORAL_TABLET | ORAL | Status: DC | PRN

## 2019-10-17 MED ORDER — ADULT MULTIVITAMIN W/MINERALS CH: 1.0000 | ORAL_TABLET | Freq: Every day | ORAL | Status: DC

## 2019-10-17 MED ORDER — MUSCLE RUB 10-15 % EX CREA: 1.0000 "application " | TOPICAL_CREAM | Freq: Two times a day (BID) | CUTANEOUS | 0 refills | Status: DC | PRN

## 2019-10-17 NOTE — Progress Notes (Addendum)
Social Work Patient ID: Alison Whitehead, female   DOB: 1950/08/10, 69 y.o.   MRN: [id number]   SW followed up with pt sister Mardene Celeste [phone]) to discuss if there was a decision made with regard to placement or home after family education this weekend. Pt sister states they would like to move forward with placement as 24/7 care is not available even though it is needed due pt lack of safety awareness. SW explained SNF placement process. Preferred SNF: [facility], [facility], and [facility]. Sw informed [facility] and [facility] both declined.   SW met with pt in room to discuss d/c plan for short term rehab process. Pt agreeable to placement.   SW left message for Judson Roch with [facility] [phone]) to discuss referral submitted via Epic. SW waiting on follow-up.  *SW received updates from [facility] in which referral declined due to no bed availability.   SW left message for Renaee Munda with [facility] [phone]) about referral. SW waiting on follow-up.  Loralee Pacas, MSW, [place] Office: [phone] Cell: [phone] Fax: [phone]

## 2019-10-17 NOTE — Progress Notes (Signed)
Physical Therapy Session Note  Patient Details  Name: Alison Whitehead MRN: [id number] Date of Birth: 07-21-1951  Today's Date: 10/17/2019 PT Individual Time: [id number] PT Individual Time Calculation (min): 60 min   Short Term Goals: Week 2:  PT Short Term Goal 1 (Week 2): =LTGs due to ELOS  Skilled Therapeutic Interventions/Progress Updates:   Pt received in supine, agreeable to therapy and c/o headache. Pt does not rate when asked and declines alerting RN for medication. Supine>sit w/ supervision. Sit<>stand w/ supervision to RW and ambulated to therapy gym w/ supervision, >150'. Discussed family edu that took place on Saturday and asked pt what her family decided regarding d/c destination. Pt stated she will be going home. Had extensive conversation regarding safety concerns w/ d/c home alone. Pt w/ difficulty following conversation and her thought process and reasoning was inconsistent. For example, she would state her apartment was safe but then express concerns as all of her items are in boxes from recently getting them out of storage. When therapist brought up option of going to her sister's house for 24/7 supervision, he stated that can't happen but would not elaborate as to why. Pt stated "if you want someone with me all the time, then you guys can pay for it". Pt also disagrees that she needs assistance for IADLs and supervision for safety, but is unable to state how she would manage meals and chores while holding onto RW. Therapist also reminds pt of cues she needs at times for RW safety and intermittent bouts of dizziness that impact safety, pt unable to recall. Pt noticeably more frustrated by conversation, continued on w/ session. Performed Berg Balance Scale and scored 32/56. Pt became dizzy w/ multiple items on scale and needed min assist to correct LOB. Related these tasks to functional tasks in her home and how she would have fallen had this therapist not been there to assist her.  Pt verbalized understanding of fall risk when discussing this and in light of Berg score. Worked on balance remainder of session with emphasis on hip and ankle balance strategies. Performed alternating toe taps to 3" step, multiple bouts of 10-20 reps. CGA-min assist for balance, had 2-3 LOB 2/2 dizziness. BP 123/86, dizziness resolves w/ brief rest break. Tossed ball in static stance, requiring pt to weight shift at hips and/or ankles and perform trunk rotations to catch ball, close supervision. Pt w/ increased fatigue and increased work of breathing. Ambulated back to room and ended session in supine, all needs in reach. Missed 15 min of skilled PT 2/2 fatigue.   Therapy Documentation Precautions:  Precautions Precautions: Fall, Other (comment) Precaution Comments: watch BP (MD parameters <140/80 and was orthostatic at eval); can be impulsive Restrictions Weight Bearing Restrictions: No Vital Signs: Therapy Vitals Pulse Rate: 82 BP: (!) 131/98  Therapy/Group: Individual Therapy  Malakie Balis Clent Demark 10/17/2019, 10:49 AM

## 2019-10-17 NOTE — Progress Notes (Signed)
[place] PHYSICAL MEDICINE & REHABILITATION PROGRESS NOTE   Subjective/Complaints:  No new complaints. Had some questions about her discharge and told me family wanted to know how much care she required  ROS: Patient denies fever, rash, sore throat, blurred vision, nausea, vomiting, diarrhea, cough, shortness of breath or chest pain, joint or back pain, headache, or mood change.     Objective:   No results found. Recent Labs    10/17/19 0532  WBC 5.0  HGB 11.3*  HCT 35.7*  PLT 199   Recent Labs    10/17/19 0532  NA 139  K 4.1  CL 105  CO2 22  GLUCOSE 95  BUN 28*  CREATININE 1.01*  CALCIUM 9.1    Intake/Output Summary (Last 24 hours) at 10/17/2019 1238 Last data filed at 10/17/2019 0900 Gross per 24 hour  Intake 500 ml  Output --  Net 500 ml     Physical Exam: Vital Signs Blood pressure (!) 131/98, pulse 82, temperature 98.6 F (37 C), temperature source Oral, resp. rate 17, height 5\' 7"  (1.702 m), weight 85.5 kg, SpO2 97 %. Constitutional: No distress . Vital signs reviewed. HEENT: EOMI, oral membranes moist Neck: supple Cardiovascular: RRR without murmur. No JVD    Respiratory/Chest: CTA Bilaterally without wheezes or rales. Normal effort    GI/Abdomen: BS +, non-tender, non-distended Ext: no clubbing, cyanosis, or edema Skin: Warm and dry.  Intact. Psych: very pleasant Musc: No edema in extremities.  No tenderness in extremities. Neuro: Alert Motor: Grossly 4+/5 throughout RUE and RLE. LUE and LLE 4 to 4+/5. Fair [place]. Senses pain and light touch. Sl decreased attention, insight and awareness.   Assessment/Plan: 1. Functional deficits secondary to right posterior pontine infarct which require 3+ hours per day of interdisciplinary therapy in a comprehensive inpatient rehab setting.  Physiatrist is providing close team supervision and 24 hour management of active medical problems listed below.  Physiatrist and rehab team continue to assess barriers to  discharge/monitor patient progress toward functional and medical goals  Care Tool:  Bathing    Body parts bathed by patient: Right arm, Left arm, Chest, Abdomen, Front perineal area, Buttocks, Right upper leg, Left upper leg, Right lower leg, Left lower leg, Face         Bathing assist Assist Level: Supervision/Verbal cueing     Upper Body Dressing/Undressing Upper body dressing   What is the patient wearing?: Pull over shirt    Upper body assist Assist Level: Independent with assistive device    Lower Body Dressing/Undressing Lower body dressing      What is the patient wearing?: Pants, Underwear/pull up     Lower body assist Assist for lower body dressing: Contact Guard/Touching assist     Toileting Toileting Toileting Activity did not occur (Clothing management and hygiene only): N/A (no void or bm)  Toileting assist Assist for toileting: Contact Guard/Touching assist     Transfers Chair/bed transfer  Transfers assist     Chair/bed transfer assist level: Supervision/Verbal cueing     Locomotion Ambulation   Ambulation assist   Ambulation activity did not occur: Safety/medical concerns(orthostatic)  Assist level: Supervision/Verbal cueing Assistive device: Walker-rolling Max distance: 150   Walk 10 feet activity   Assist  Walk 10 feet activity did not occur: Safety/medical concerns(orthostatic)  Assist level: Supervision/Verbal cueing Assistive device: Walker-rolling   Walk 50 feet activity   Assist Walk 50 feet with 2 turns activity did not occur: Safety/medical concerns(orthostatic)  Assist level: Supervision/Verbal cueing Assistive  device: Walker-rolling    Walk 150 feet activity   Assist Walk 150 feet activity did not occur: Safety/medical concerns(orthostatic)  Assist level: Supervision/Verbal cueing Assistive device: Walker-rolling    Walk 10 feet on uneven surface  activity   Assist Walk 10 feet on uneven surfaces  activity did not occur: Safety/medical concerns(orthostatic)         Wheelchair     Assist Will patient use wheelchair at discharge?: No      Wheelchair assist level: Moderate Assistance - Patient 50 - 74% Max wheelchair distance: 26ft    Wheelchair 50 feet with 2 turns activity    Assist    Wheelchair 50 feet with 2 turns activity did not occur: Safety/medical concerns(orthostatic)       Wheelchair 150 feet activity     Assist  Wheelchair 150 feet activity did not occur: Safety/medical concerns(orthostatic)       Blood pressure (!) 131/98, pulse 82, temperature 98.6 F (37 C), temperature source Oral, resp. rate 17, height 5\' 7"  (1.702 m), weight 85.5 kg, SpO2 97 %.  Medical Problem List and Plan: 1.  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   Family apparently opting for SNF d/t lack of 24 care  2.  Antithrombotics: -DVT/anticoagulation:  Pharmaceutical: Lovenox 3/14- will con't until d/c- since pt should be walking well enough             -antiplatelet therapy: ASA 3. Persistent HA/Pain Management: Will change oxycodone to ultram prn. Add topamax to treat HA.             -3/15 Headache controlled with topamax 50mg  bid 4. Mood: LCSW to follow for evaluation and support.              -antipsychotic agents: N/A. Appreciate neuropsych evaluation.  5. Neuropsych: This patient is not capable of making decisions on her own behalf. 6. Skin/Wound Care: Routine pressure relief measures.  7. Fluids/Electrolytes/Nutrition: encourage po 8. HTN: Monitor BP   HCTZ, amlodipine DC'd  Norvasc, coreg bid, Bidil, lisinopril, Zestril. Has not taken medications for months. Will need to monitor for hypotension as medications get to steady state.             -need to keep BP <140/80 per Neurology   Lisinopril decreased to 10 on 3/8.   + Orthostasis on  3/8 - ACE wraps/Abd binder as necessary  3/9: lisinopril decreased to 5mg .   3/10: Currently well controlled; maintain current dosage. Continue ACE and abdominal binder during therapy.   3/11: Well controlled thorughout therapy sessions yesterday  Encourage fluids  3/13- BP 110/77- hanging out in 110s- no Sx's  3/14- SBP 110s-120s- doing well con't meds- no hypotension/Sx;s today  3/15 bp's have been better controlled although DBP's elevated today---no changes 9. OA bilateral knees: continue Voltaren gel to knees qid. Avoid narcotics due to ongoing polysubstance abuse.  10. AKI:   See #8  -encourage fluids  Creatinine 1.42 on 3/8  IVF nightly x2 nights started 3/8, echocardiogram with ejection fraction of 55 to 60%  3/15 I personally reviewed the patient's labs today.  Cr 1.01 11. Prediabetes: Hgb A1c- 6.2. Will monitor BS ac/hs and have RD educate on CM diet.    Labile on 3/8, monitor trend    well controlled.  12. Nausea: scopolamine patch, prn compazine. Will add prn zofran as well. EKG tomorrow morning to monitor qTC.  3/12: continues to be nauseous this morning. Will not increase medications further given qTC prolongation.   3/13-15: didn't mention nausea- cont' regimen 13. Dizziness: scheduled meclizine 14. qTC prlonged on EKG today. Zofran d/ced.  15/ Insomnia  3/13- too sedated with trazodone 25 mg- will try melatonin 6 mg QHS.   3/14- slept great with melatonin- will con't and sugfest after d/c- no hangover affect   LOS: 11 days A FACE TO FACE EVALUATION WAS PERFORMED  Meredith Staggers 10/17/2019, 12:38 PM

## 2019-10-17 NOTE — Progress Notes (Addendum)
Social Work Patient ID: Alison Whitehead, female   DOB: 06-19-1951, 69 y.o.   MRN: [id number]    SW received phone call from  [facility] [phone]) stating she sent a request for clinicals this morning. She states today is pt last covered day, which is the reason for the request to review. SW informed information will be passed along to appropriate staff.   Loralee Pacas, MSW, [place] Office: [phone] Cell: [phone] Fax: [phone]

## 2019-10-17 NOTE — Progress Notes (Addendum)
Occupational Therapy Session Note  Patient Details  Name: Alison Whitehead MRN: [id number] Date of Birth: [birth year]  Today's Date: 10/17/2019 OT Individual Time: 1330-1445 OT Individual Time Calculation (min): 75 min   Short Term Goals: Week 1:  OT Short Term Goal 1 (Week 1): Pt will transfer to toilet wiht CGA OT Short Term Goal 2 (Week 1): Pt will groomin in standing wiht S OT Short Term Goal 3 (Week 1): Pt will complete toileting with CGA  Skilled Therapeutic Interventions/Progress Updates:  Patient met lying supine in bed in agreement with OT treatment session. 0/10 c/o pain at rest and slight arthritic pain in joints with activity 4/10 on 0-10 pain scale. Patient ambulated to commode in bathroom with close supervision A and completed toilet transfer, toileting/hygiene/clothing management with supervision A. Patient demonstrates increased safety awareness and pacing with all self-care tasks this date. Patient completed hand hygiene standing at sink level with request to trim nails. Finger nail clipper and emery board supplied with patient able to complete grooming task standing/sitting at sink level. Patient engaged in bed making task with close supervision A without use of DME to challenge dynamic standing balance and activity tolerance with patient requiring 1 seated rest break. Patient ambulated <> dayroom with use of RW and close supervision A. Dynamic standing balance activity with focus on weight shifting and posture to decrease risk of falls and increase safety in prep for d/c to next level of care. Patient left seated in recliner with chair alarm activated, call bell within reach and all needs met  Therapy Documentation Precautions:  Precautions Precautions: Fall, Other (comment) Precaution Comments: watch BP (MD parameters <140/80 and was orthostatic at eval); can be impulsive Restrictions Weight Bearing Restrictions: No   Therapy/Group: Individual Therapy  Indra Wolters R  [place] 10/17/2019, 8:00 AM

## 2019-10-17 NOTE — Progress Notes (Signed)
Speech Language Pathology Daily Session Note  Patient Details  Name: Alison Whitehead MRN: [id number] Date of Birth: [birth year]  Today's Date: 10/17/2019 SLP Individual Time: 0810-0850 SLP Individual Time Calculation (min): 40 min  Short Term Goals: Week 2: SLP Short Term Goal 1 (Week 2): STGs=LTGs due to ELOS  Skilled Therapeutic Interventions: Skilled treatment session focused on cognitive goals. Upon arrival, patient was dressing while sitting EOB. Patient completed all tasks with close supervision but was overall Mod I for safety. SLP also facilitated session by re-administering the MoCA-Version 7.3. Patient scored 21/30 points with a score of 26 or above considered normal. Patient demonstrated deficits in executive functioning, short-term recall and attention. Patient did not do as well as this SLP thought she would and patient reported she was "distracted" by details of her upcoming discharge. Patient left upright in bed with alarm on and all needs within reach. Continue with current plan of care.      Pain No/Denies Pain   Therapy/Group: Individual Therapy  Joanathan Affeldt 10/17/2019, 12:16 PM

## 2019-10-18 ENCOUNTER — Inpatient Hospital Stay (HOSPITAL_COMMUNITY): Payer: Medicare HMO | Admitting: Occupational Therapy

## 2019-10-18 ENCOUNTER — Inpatient Hospital Stay (HOSPITAL_COMMUNITY): Payer: Medicare HMO | Admitting: Speech Pathology

## 2019-10-18 ENCOUNTER — Inpatient Hospital Stay (HOSPITAL_COMMUNITY): Payer: Medicare HMO | Admitting: Physical Therapy

## 2019-10-18 LAB — CBC
HCT: 35.3 % — ABNORMAL LOW (ref 36.0–46.0)
Hemoglobin: 11 g/dL — ABNORMAL LOW (ref 12.0–15.0)
MCH: 27.3 pg (ref 26.0–34.0)
MCHC: 31.2 g/dL (ref 30.0–36.0)
MCV: 87.6 fL (ref 80.0–100.0)
Platelets: 200 10*3/uL (ref 150–400)
RBC: 4.03 MIL/uL (ref 3.87–5.11)
RDW: 13.4 % (ref 11.5–15.5)
WBC: 4.8 10*3/uL (ref 4.0–10.5)
nRBC: 0 % (ref 0.0–0.2)

## 2019-10-18 LAB — GLUCOSE, CAPILLARY
Glucose-Capillary: 119 mg/dL — ABNORMAL HIGH (ref 70–99)
Glucose-Capillary: 115 mg/dL — ABNORMAL HIGH (ref 70–99)
Glucose-Capillary: 121 mg/dL — ABNORMAL HIGH (ref 70–99)
Glucose-Capillary: 155 mg/dL — ABNORMAL HIGH (ref 70–99)

## 2019-10-18 MED ORDER — OXYMETAZOLINE HCL 0.05 % NA SOLN
1.0000 | Freq: Two times a day (BID) | NASAL | Status: DC
  Administered 2019-10-18 – 2019-10-29 (×22): 1 via NASAL
  Filled 2019-10-18: qty 30

## 2019-10-18 NOTE — Progress Notes (Signed)
Speech Language Pathology Daily Session Note  Patient Details  Name: Alison Whitehead MRN: [id number] Date of Birth: 02-Jun-1951  Today's Date: 10/18/2019 SLP Individual Time: [id number] SLP Individual Time Calculation (min): 60 min  Short Term Goals: Week 2: SLP Short Term Goal 1 (Week 2): STGs=LTGs due to ELOS  Skilled Therapeutic Interventions: Skilled treatment session focused on cognitive goals. SLP facilitated session by providing supervision level verbal cues for recall of events from previous therapy sessions. Patient requested to use the bathroom and required supervision-Min A verbal cues to stay within her RW during ambulation. SLP also facilitated session by providing extra time and overall Max A verbal cues for problem solving during a complex problem solving and organization task with limited awareness of errors. Recommend to continue task at next appointment due to time constraints. Patient left upright in the recliner with alarm on and all needs within reach. Continue with current plan of care.      Pain Pain Assessment Pain Scale: 0-10 Pain Score: 0-No pain  Therapy/Group: Individual Therapy  Andree Heeg 10/18/2019, 10:07 AM

## 2019-10-18 NOTE — Progress Notes (Signed)
Occupational Therapy Session Note  Patient Details  Name: Alison Whitehead MRN: [id number] Date of Birth: November 01, 1950  Today's Date: 10/18/2019 OT Individual Time: 0915-1000 OT Individual Time Calculation (min): 45 min   OT Individual Time: 1130-1200 OT Individual Time Calculation (min): 30 min  Short Term Goals: Week 1:  OT Short Term Goal 1 (Week 1): Pt will transfer to toilet wiht CGA OT Short Term Goal 2 (Week 1): Pt will groomin in standing wiht S OT Short Term Goal 3 (Week 1): Pt will complete toileting with CGA  Skilled Therapeutic Interventions/Progress Updates:  Session 1: Patient met seated in recliner in agreement with OT treatment session. Patient reporting 7/10 pain in bilateral shoulders with activity this date. Application of topical gel with patient noting decrease in pain. Patient able to collect ADL items with supervision A and use of RW in prep for shower. Shower transfer to TTB with supervision A and 1-2 vc's for safety. Patient continues to demonstrate need for minimal verbal cues for safety awareness with ambulation, functional transfers, and during self-care tasks including bathing/dressing. Patient performed UB/LB bathing in sitting/standing with supervision A. Footwear with total A for time management. Attempted therapeutic exercise for 2 sets x10 reps each BUE HEP with orange theraband with patient requiring vc/tc for pacing and form. Patient c/o increased pain with L shoulder flexion secondary to arthritis/bursitis. Activity downgraded with education on movement in pain free range with patient able to return demonstrate. Patient left seated in recliner with call bell within reach, chair alarm activated, and all needs met.   Session 2: Patient received seated in recliner in agreement with OT treatment session. Focus on functional mobility and community re-integration with patient able to ambulate to central elevators without need for rest breaks. Patient ambulated  around gift shop with education on walker safety through doorways and around obstacles. Patient transported back to room in wc with total assist for time management. Session concluded with patient seated in recliner with call bell within reach, chair alarm activated, and all needs met.   Session 2:  Therapy Documentation Precautions:  Precautions Precautions: Fall, Other (comment) Precaution Comments: watch BP (MD parameters <140/80 and was orthostatic at eval); can be impulsive Restrictions Weight Bearing Restrictions: No   Therapy/Group: Individual Therapy  Alison Whitehead 10/18/2019, 7:55 AM

## 2019-10-18 NOTE — Progress Notes (Signed)
[place] PHYSICAL MEDICINE & REHABILITATION PROGRESS NOTE   Subjective/Complaints:  No big problems overnight. Still having headache/stuffy nose. Asked about when she could go and if she would need to go to another facility before going to her home  ROS: Patient denies fever, rash, sore throat, blurred vision, nausea, vomiting, diarrhea, cough, shortness of breath or chest pain, joint or back pain,   or mood change.    Objective:   No results found. Recent Labs    10/17/19 0532 10/18/19 0638  WBC 5.0 4.8  HGB 11.3* 11.0*  HCT 35.7* 35.3*  PLT 199 200   Recent Labs    10/17/19 0532  NA 139  K 4.1  CL 105  CO2 22  GLUCOSE 95  BUN 28*  CREATININE 1.01*  CALCIUM 9.1    Intake/Output Summary (Last 24 hours) at 10/18/2019 1154 Last data filed at 10/18/2019 0849 Gross per 24 hour  Intake 840 ml  Output --  Net 840 ml     Physical Exam: Vital Signs Blood pressure 116/85, pulse 73, temperature 99.3 F (37.4 C), temperature source Oral, resp. rate 16, height 5\' 7"  (1.702 m), weight 85.5 kg, SpO2 98 %. Constitutional: No distress . Vital signs reviewed. HEENT: EOMI, oral membranes moist Neck: supple Cardiovascular: RRR without murmur. No JVD    Respiratory/Chest: CTA Bilaterally without wheezes or rales. Normal effort    GI/Abdomen: BS +, non-tender, non-distended Ext: no clubbing, cyanosis, or edema Skin: Warm and dry.  Intact. Psych: very pleasant Musc: No edema in extremities.  No tenderness in extremities. Neuro: Alert Motor: Grossly 4+/5 throughout RUE and RLE. LUE and LLE 4 to 4+/5. Fair [place]. Senses pain and light touch. Decreased STM and attention--near current baseline.   Assessment/Plan: 1. Functional deficits secondary to right posterior pontine infarct which require 3+ hours per day of interdisciplinary therapy in a comprehensive inpatient rehab setting.  Physiatrist is providing close team supervision and 24 hour management of active medical problems  listed below.  Physiatrist and rehab team continue to assess barriers to discharge/monitor patient progress toward functional and medical goals  Care Tool:  Bathing    Body parts bathed by patient: Right arm, Left arm, Chest, Abdomen, Front perineal area, Buttocks, Right upper leg, Left upper leg, Right lower leg, Left lower leg, Face         Bathing assist Assist Level: Supervision/Verbal cueing     Upper Body Dressing/Undressing Upper body dressing   What is the patient wearing?: Pull over shirt    Upper body assist Assist Level: Independent with assistive device    Lower Body Dressing/Undressing Lower body dressing      What is the patient wearing?: Pants, Underwear/pull up     Lower body assist Assist for lower body dressing: Contact Guard/Touching assist     Toileting Toileting Toileting Activity did not occur (Clothing management and hygiene only): N/A (no void or bm)  Toileting assist Assist for toileting: Supervision/Verbal cueing     Transfers Chair/bed transfer  Transfers assist     Chair/bed transfer assist level: Supervision/Verbal cueing     Locomotion Ambulation   Ambulation assist   Ambulation activity did not occur: Safety/medical concerns(orthostatic)  Assist level: Supervision/Verbal cueing Assistive device: Walker-rolling Max distance: 150   Walk 10 feet activity   Assist  Walk 10 feet activity did not occur: Safety/medical concerns(orthostatic)  Assist level: Supervision/Verbal cueing Assistive device: Walker-rolling   Walk 50 feet activity   Assist Walk 50 feet with 2 turns  activity did not occur: Safety/medical concerns(orthostatic)  Assist level: Supervision/Verbal cueing Assistive device: Walker-rolling    Walk 150 feet activity   Assist Walk 150 feet activity did not occur: Safety/medical concerns(orthostatic)  Assist level: Supervision/Verbal cueing Assistive device: Walker-rolling    Walk 10 feet on  uneven surface  activity   Assist Walk 10 feet on uneven surfaces activity did not occur: Safety/medical concerns(orthostatic)         Wheelchair     Assist Will patient use wheelchair at discharge?: No      Wheelchair assist level: Moderate Assistance - Patient 50 - 74% Max wheelchair distance: 30ft    Wheelchair 50 feet with 2 turns activity    Assist    Wheelchair 50 feet with 2 turns activity did not occur: Safety/medical concerns(orthostatic)       Wheelchair 150 feet activity     Assist  Wheelchair 150 feet activity did not occur: Safety/medical concerns(orthostatic)       Blood pressure 116/85, pulse 73, temperature 99.3 F (37.4 C), temperature source Oral, resp. rate 16, height 5\' 7"  (1.702 m), weight 85.5 kg, SpO2 98 %.  Medical Problem List and Plan: 1.  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   Family apparently opting for SNF d/t lack of 24 care. Spoke to patient at length regarding my concerns re: safety/impulsivity/STM and why she might need a short SNF stay. 2.  Antithrombotics: -DVT/anticoagulation:  Pharmaceutical: Lovenox 3/14- will con't until d/c- since pt should be walking well enough             -antiplatelet therapy: ASA 3. Persistent HA/Pain Management: Will change oxycodone to ultram prn. Add topamax to treat HA.             -3/15 Headache controlled with topamax 50mg  bid 4. Mood: LCSW to follow for evaluation and support.              -antipsychotic agents: N/A. Appreciate neuropsych evaluation.  5. Neuropsych: This patient is not capable of making decisions on her own behalf. 6. Skin/Wound Care: Routine pressure relief measures.  7. Fluids/Electrolytes/Nutrition: encourage po 8. HTN: Monitor BP   HCTZ, amlodipine DC'd  Norvasc, coreg bid, Bidil, lisinopril, Zestril. Has not taken medications for  months. Will need to monitor for hypotension as medications get to steady state.             -need to keep BP <140/80 per Neurology   Lisinopril decreased to 10 on 3/8.   + Orthostasis on 3/8 - ACE wraps/Abd binder as necessary  3/9: lisinopril decreased to 5mg .   3/10: Currently well controlled; maintain current dosage. Continue ACE and abdominal binder during therapy.   3/11: Well controlled thorughout therapy sessions yesterday  Encourage fluids  3/13- BP 110/77- hanging out in 110s- no Sx's  3/14- SBP 110s-120s- doing well con't meds- no hypotension/Sx;s today  3/15 bp's have been better controlled although DBP's elevated today---no changes 9. OA bilateral knees: continue Voltaren gel to knees qid. Avoid narcotics due to ongoing polysubstance abuse.  10. AKI:   See #8  -encourage fluids  Creatinine 1.42 on 3/8  IVF nightly x2 nights started 3/8, echocardiogram with ejection fraction of 55 to 60%  3/15 I personally reviewed the patient's labs today.  Cr 1.01 11. Prediabetes: Hgb A1c- 6.2. Will monitor BS ac/hs and have RD educate on CM diet.  Labile on 3/8, monitor trend    well controlled.  12. Nausea: scopolamine patch, prn compazine. Will add prn zofran as well. EKG tomorrow morning to monitor qTC.   3/12: continues to be nauseous this morning. Will not increase medications further given qTC prolongation.   3/16 more nauseas at night apparently. Continue above medications. Did not mention a word re: nausea the last two visits with me 13. Dizziness: scheduled meclizine 14. qTC prlonged on EKG today. Zofran d/ced.  15/ Insomnia  3/13- too sedated with trazodone 25 mg- will try melatonin 6 mg QHS.   3/14- slept great with melatonin- will con't and sugfest after d/c- no hangover affect   LOS: 12 days A FACE TO FACE EVALUATION WAS PERFORMED  Meredith Staggers 10/18/2019, 11:54 AM

## 2019-10-18 NOTE — Progress Notes (Signed)
Social Work Patient ID: Alison Whitehead, female   DOB: November 21, 1950, 68 y.o.   MRN: [id number]   SW spoke with Joellen Jersey Osmund/Admissions with [facility] [phone]) about referral. Will review and follow-up.    Loralee Pacas, MSW, [place] Office: [phone] Cell: [phone] Fax: [phone]

## 2019-10-18 NOTE — Patient Care Conference (Signed)
Inpatient RehabilitationTeam Conference and Plan of Care Update Date: 10/18/2019   Time: 10:10 AM    Patient Name: Alison Whitehead      Medical Record Number: [id number]  Date of Birth: 1951/04/22 Sex: Female         Room/Bed: 4W16C/4W16C-02 Payor Info: Payor: [employer] MEDICARE / Plan: [place] MEDICARE HMO/PPO / Product Type: *No Product type* /    Admit Date/Time:  10/06/2019  1:58 PM  Primary Diagnosis:  Right pontine stroke [facility])  Patient Active Problem List   Diagnosis Date Noted  . Small vessel disease, cerebrovascular   . Prediabetes   . AKI (acute kidney injury) ([place])   . Orthostatic hypotension   . Right pontine stroke ([place]) 10/06/2019  . Acute cerebrovascular accident (CVA) ([place]) 09/30/2019  . Unilateral primary osteoarthritis, left knee 03/17/2019  . Unilateral primary osteoarthritis, right knee 03/17/2019  . Bilateral primary osteoarthritis of knee 03/10/2019  . INSOMNIA, CHRONIC 05/19/2007  . SPONDYLOSIS, LUMBAR 05/19/2007  . Essential hypertension 05/03/2007  . ARTHRITIS, KNEE 05/03/2007    Expected Discharge Date: Expected Discharge Date: (Plan is for SNF placement when bed is available.)  Team Members Present: Physician leading conference: Dr. Alger Simons Care Coodinator Present: Loralee Pacas, LCSWA;Genie April Colter, RN, MSN Nurse Present: Other (comment)(Amanda Archie, RN) PT Present: Burnard Bunting, PT OT Present: Willeen Cass, OT SLP Present: Weston Anna, SLP PPS Coordinator present : Ileana Ladd, Burna Mortimer, SLP     Current Status/Progress Goal Weekly Team Focus  Bowel/Bladder   (P) cont. of bowel and bladder. lbm-10/17/2019  (P) Remain cont of bowel and bladder   (P) assess q shift and prn    Swallow/Nutrition/ Hydration             ADL's   CGA- S overall; minimally improved safety awareness; family ed completed  S overall  d/c home; safety awareness, DME training, cont safety awareness   Mobility   supervision w/ RW 150' and 12 steps  w/ 1-2 rails, BP and nausea/dizziness much improved, poor safety awareness and poor awareness of deficits continues  supervision  discharge planning, pt education, safety awareness, fall risk education, endurance and global strength   Communication             Safety/Cognition/ Behavioral Observations  Min-Mod A  Supervision  complex problem solving, recall with use of strategies, safety   Pain   (P) pt complains of level 6/10 headache  (P) to eliminate headaches   assess pain q shift and prn, utilize prn pain medication as needed    Skin   no eviddence of skin break down  remain free of skin breakdown and infection  Assess skin q shift and orn    Rehab Goals Patient on target to meet rehab goals: Yes *See Care Plan and progress notes for long and short-term goals.     Barriers to Discharge  Current Status/Progress Possible Resolutions Date Resolved   Nursing                  PT                    OT                  SLP                SW Decreased caregiver support;Lack of/limited family support              Discharge Planning/Teaching Needs:  D/c to SNF per  family request. Family states they are not able to provide 24/7 care at this time.  family education as recommended by therapy   Team Discussion: MD R pontine infarct, cognition issues, impulsive, spoke with Dtr yesterday, safety awareness issues, needs a couple of weeks in SNF.  RN nausea at night, on meclozine, scopolamine, lovenox, cont B/B.  OT S transfers, decreased safety awareness, joint pain, allergies, headaches.  PT S RW 150', 12 steps 1-2 rails CGA, needs 24/7 S, at goal level.  SLP cognition impairment.  SW plan is now for SNF placement.   Revisions to Treatment Plan: N/A     Medical Summary Current Status: right pontine infarct. bp improving. nausea at night time, headaches present but better Weekly Focus/Goal: headaches, cognition, pt/family ed  Barriers to Discharge: Behavior   Possible Resolutions to  Barriers: supervision at next venue of care   Continued Need for Acute Rehabilitation Level of Care: The patient requires daily medical management by a physician with specialized training in physical medicine and rehabilitation for the following reasons: Direction of a multidisciplinary physical rehabilitation program to maximize functional independence : Yes Medical management of patient stability for increased activity during participation in an intensive rehabilitation regime.: Yes Analysis of laboratory values and/or radiology reports with any subsequent need for medication adjustment and/or medical intervention. : Yes   I attest that I was present, lead the team conference, and concur with the assessment and plan of the team.   Retta Diones 10/18/2019, 2:15 PM   Team conference was held via web/ teleconference due to [place] - 19

## 2019-10-18 NOTE — Progress Notes (Signed)
Physical Therapy Session Note  Patient Details  Name: Alison Whitehead MRN: [id number] Date of Birth: [birth year]  Today's Date: 10/18/2019 PT Individual Time: 1340-1445 PT Individual Time Calculation (min): 65 min   Short Term Goals: Week 2:  PT Short Term Goal 1 (Week 2): =LTGs due to ELOS  Skilled Therapeutic Interventions/Progress Updates:   Pt on phone upon PT arrival to room, pt requesting to finish conversation prior to start of therapy. Missed 10 min of skilled PT 2/2 refusal. Pt in recliner and agreeable to therapy, denies pain. Sit<>stands w/ supervision and ambulated to/from therapy gym and around unit w/ supervision using RW. Continues to need verbal cues for safe RW management, pt at one point answered phone and continued to walk w/ one hand on RW, needed cues to pause and rest in standing while talking on the phone. Practiced gait over uneven surfaces, stair negotation x12 w/ B rails, and car transfer. All tasks performed w/ supervision and verbal cues for safety. Additionally practiced gait w/o AD, ambulated 150' w/ CGA but increased work of breathing. Performed NuStep 5 min x2 @ level 4 for global strength and endurance. Ambulated back to room w/ RW. Performed toilet transfer and hand hygiene at sink in standing, both w/ supervision. Ended session in recliner, all needs in reach.   Therapy Documentation Precautions:  Precautions Precautions: Fall, Other (comment) Precaution Comments: watch BP (MD parameters <140/80 and was orthostatic at eval); can be impulsive Restrictions Weight Bearing Restrictions: No Vital Signs: Therapy Vitals Pulse Rate: 78 BP: 111/76  Therapy/Group: Individual Therapy  Chimere Klingensmith Clent Demark 10/18/2019, 6:52 PM

## 2019-10-19 ENCOUNTER — Inpatient Hospital Stay (HOSPITAL_COMMUNITY): Payer: Medicare HMO | Admitting: Physical Therapy

## 2019-10-19 ENCOUNTER — Inpatient Hospital Stay (HOSPITAL_COMMUNITY): Payer: Medicare HMO

## 2019-10-19 ENCOUNTER — Inpatient Hospital Stay (HOSPITAL_COMMUNITY): Payer: Medicare HMO | Admitting: Speech Pathology

## 2019-10-19 LAB — BASIC METABOLIC PANEL
Anion gap: 8 (ref 5–15)
BUN: 31 mg/dL — ABNORMAL HIGH (ref 8–23)
CO2: 24 mmol/L (ref 22–32)
Calcium: 8.8 mg/dL — ABNORMAL LOW (ref 8.9–10.3)
Chloride: 104 mmol/L (ref 98–111)
Creatinine, Ser: 1.09 mg/dL — ABNORMAL HIGH (ref 0.44–1.00)
GFR calc Af Amer: >60 mL/min (ref >60)
GFR calc non Af Amer: 52 mL/min — ABNORMAL LOW (ref >60)
Glucose, Bld: 100 mg/dL — ABNORMAL HIGH (ref 70–99)
Potassium: 4.4 mmol/L (ref 3.5–5.1)
Sodium: 136 mmol/L (ref 135–145)

## 2019-10-19 LAB — GLUCOSE, CAPILLARY
Glucose-Capillary: 107 mg/dL — ABNORMAL HIGH (ref 70–99)
Glucose-Capillary: 105 mg/dL — ABNORMAL HIGH (ref 70–99)
Glucose-Capillary: 100 mg/dL — ABNORMAL HIGH (ref 70–99)
Glucose-Capillary: 106 mg/dL — ABNORMAL HIGH (ref 70–99)

## 2019-10-19 MED ORDER — SODIUM CHLORIDE 0.9 % IV SOLN: INTRAVENOUS | Status: AC

## 2019-10-19 MED ORDER — HYDRALAZINE HCL 25 MG PO TABS: 25.0000 mg | ORAL_TABLET | Freq: Three times a day (TID) | ORAL | Status: DC

## 2019-10-19 MED ORDER — HYDRALAZINE HCL 25 MG PO TABS: 37.5000 mg | ORAL_TABLET | Freq: Three times a day (TID) | ORAL | Status: DC

## 2019-10-19 NOTE — Progress Notes (Addendum)
Physical Therapy Session Note  Patient Details  Name: Alison Whitehead MRN: [id number] Date of Birth: [birth year]  Today's Date: 10/19/2019 PT Individual Time: [id number] PT Individual Time Calculation (min): 8 min   Short Term Goals: Week 2:  PT Short Term Goal 1 (Week 2): =LTGs due to ELOS  Skilled Therapeutic Interventions/Progress Updates:  Pt received in bed with c/o nausea - nurse made aware & anti nausea medication requested. Vitals checked with pt L sidelying in bed: BP = 84/55 mmHg (RUE), HR = 76 bpm with pt with increasing moans/groans and nurse in room & aware. Pt transitioned to supine & encouraged to scoot up in bed, requiring cuing to use bed rails. Pt left in bed with alarm set, in care of nurse. Will attempt to see pt later as time allows.  Addendum: Pt reports she feels like she did when she had her stroke. Nurse in room & aware of pt's c/o.  Therapy Documentation Precautions:  Precautions Precautions: Fall, Other (comment) Precaution Comments: watch BP (MD parameters <140/80 and was orthostatic at eval); can be impulsive Restrictions Weight Bearing Restrictions: No General: PT Amount of Missed Time (min): 52 Minutes PT Missed Treatment Reason: Patient ill (Comment)(low BP, nausea)    Therapy/Group: Individual Therapy  Waunita Schooner 10/19/2019, 11:53 AM

## 2019-10-19 NOTE — Progress Notes (Signed)
[facility] PHYSICAL MEDICINE & REHABILITATION PROGRESS NOTE   Subjective/Complaints:  Up at sink working with OT this morning. No problems. Later apparently not feeling well. (I am currently out of the office and PA to see)  ROS: Patient denies fever, rash, sore throat, blurred vision, nausea, vomiting, diarrhea, cough, shortness of breath or chest pain, joint or back pain, headache, or mood change.   Objective:   No results found. Recent Labs    10/17/19 0532 10/18/19 0638  WBC 5.0 4.8  HGB 11.3* 11.0*  HCT 35.7* 35.3*  PLT 199 200   Recent Labs    10/17/19 0532  NA 139  K 4.1  CL 105  CO2 22  GLUCOSE 95  BUN 28*  CREATININE 1.01*  CALCIUM 9.1    Intake/Output Summary (Last 24 hours) at 10/19/2019 1252 Last data filed at 10/19/2019 0900 Gross per 24 hour  Intake 180 ml  Output --  Net 180 ml     Physical Exam: Vital Signs Blood pressure (!) 84/55, pulse 76, temperature 97.7 F (36.5 C), resp. rate 20, height 5\' 7"  (1.702 m), weight 85.5 kg, SpO2 98 %. Constitutional: No distress . Vital signs reviewed. HEENT: EOMI, oral membranes moist Neck: supple Cardiovascular: RRR without murmur. No JVD    Respiratory/Chest: CTA Bilaterally without wheezes or rales. Normal effort    GI/Abdomen: BS +, non-tender, non-distended Ext: no clubbing, cyanosis, or edema Skin: Warm and dry.  Intact. Psych: very pleasant, sl anxious Musc: No edema in extremities.  No tenderness in extremities. Neuro: Alert Motor: Grossly 4+/5 throughout RUE and RLE. LUE and LLE 4 to 4+/5. Fair [place]. Senses pain and light touch. Decreased STM and attention--near current baseline.   Assessment/Plan: 1. Functional deficits secondary to right posterior pontine infarct which require 3+ hours per day of interdisciplinary therapy in a comprehensive inpatient rehab setting.  Physiatrist is providing close team supervision and 24 hour management of active medical problems listed below.  Physiatrist and  rehab team continue to assess barriers to discharge/monitor patient progress toward functional and medical goals  Care Tool:  Bathing    Body parts bathed by patient: Right arm, Left arm, Chest, Abdomen, Front perineal area, Buttocks, Right upper leg, Left upper leg, Right lower leg, Left lower leg, Face         Bathing assist Assist Level: Supervision/Verbal cueing     Upper Body Dressing/Undressing Upper body dressing   What is the patient wearing?: Pull over shirt    Upper body assist Assist Level: Independent with assistive device    Lower Body Dressing/Undressing Lower body dressing      What is the patient wearing?: Pants, Underwear/pull up     Lower body assist Assist for lower body dressing: Supervision/Verbal cueing     Toileting Toileting Toileting Activity did not occur (Clothing management and hygiene only): N/A (no void or bm)  Toileting assist Assist for toileting: Supervision/Verbal cueing     Transfers Chair/bed transfer  Transfers assist     Chair/bed transfer assist level: Supervision/Verbal cueing     Locomotion Ambulation   Ambulation assist   Ambulation activity did not occur: Safety/medical concerns(orthostatic)  Assist level: Supervision/Verbal cueing Assistive device: Walker-rolling Max distance: 150   Walk 10 feet activity   Assist  Walk 10 feet activity did not occur: Safety/medical concerns(orthostatic)  Assist level: Supervision/Verbal cueing Assistive device: Walker-rolling   Walk 50 feet activity   Assist Walk 50 feet with 2 turns activity did not occur: Safety/medical concerns(orthostatic)  Assist level: Supervision/Verbal cueing Assistive device: Walker-rolling    Walk 150 feet activity   Assist Walk 150 feet activity did not occur: Safety/medical concerns(orthostatic)  Assist level: Supervision/Verbal cueing Assistive device: Walker-rolling    Walk 10 feet on uneven surface  activity   Assist Walk  10 feet on uneven surfaces activity did not occur: Safety/medical concerns(orthostatic)   Assist level: Supervision/Verbal cueing     Wheelchair     Assist Will patient use wheelchair at discharge?: No   Wheelchair activity did not occur: N/A  Wheelchair assist level: Moderate Assistance - Patient 50 - 74% Max wheelchair distance: 28ft    Wheelchair 50 feet with 2 turns activity    Assist    Wheelchair 50 feet with 2 turns activity did not occur: N/A       Wheelchair 150 feet activity     Assist  Wheelchair 150 feet activity did not occur: N/A       Blood pressure (!) 84/55, pulse 76, temperature 97.7 F (36.5 C), resp. rate 20, height 5\' 7"  (1.702 m), weight 85.5 kg, SpO2 98 %.  Medical Problem List and Plan: 1.  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   Discussed pt the plan for SNF. She ultimately wants to get home on her own again. Unsure of how long it might take her to reach that level.   -PA to see regarding pt not feeling well. May be related to low BP's and hypoperfusion.  2.  Antithrombotics: -DVT/anticoagulation:  Pharmaceutical: Lovenox 3/14- will con't until d/c- since pt should be walking well enough             -antiplatelet therapy: ASA 3. Persistent HA/Pain Management: Will change oxycodone to ultram prn. Add topamax to treat HA.             -3/15 Headache controlled with topamax 50mg  bid 4. Mood: LCSW to follow for evaluation and support.              -antipsychotic agents: N/A. Appreciate neuropsych evaluation.  5. Neuropsych: This patient is not capable of making decisions on her own behalf. 6. Skin/Wound Care: Routine pressure relief measures.  7. Fluids/Electrolytes/Nutrition: encourage po 8. HTN: Monitor BP   HCTZ, amlodipine DC'd  Norvasc, coreg bid, Bidil, lisinopril, Zestril. Has not taken medications for  months. Will need to monitor for hypotension as medications get to steady state.             -need to keep BP <140/80 per Neurology   Lisinopril decreased to 10 on 3/8.   + Orthostasis on 3/8 - ACE wraps/Abd binder as necessary  3/9: lisinopril decreased to 5mg .   3/10: Currently well controlled; maintain current dosage. Continue ACE and abdominal binder during therapy.   3/11: Well controlled thorughout therapy sessions yesterday  Encourage fluids  3/13- BP 110/77- hanging out in 110s- no Sx's  3/14- SBP 110s-120s- doing well con't meds- no hypotension/Sx;s today  3/17 low bp today--holding meds, check labs, recheck vs   -may be volume depleted.  9. OA bilateral knees: continue Voltaren gel to knees qid. Avoid narcotics due to ongoing polysubstance abuse.  10. AKI:   See #8  -encourage fluids  Creatinine 1.42 on 3/8  IVF nightly x2 nights started 3/8, echocardiogram with ejection fraction of 55 to 60%  3/17 rechecking labs today.  11. Prediabetes: Hgb A1c- 6.2. Will monitor  BS ac/hs and have RD educate on CM diet.    Labile on 3/8, monitor trend    well controlled.  12. Nausea: scopolamine patch, prn compazine. Will add prn zofran as well. EKG tomorrow morning to monitor qTC.   3/12: continues to be nauseous this morning. Will not increase medications further given qTC prolongation.   3/16 more nauseas at night apparently. Continue above medications. Did not mention a word re: nausea the last two visits with me 13. Dizziness: scheduled meclizine 14. qTC prlonged on EKG today. Zofran d/ced.  15/ Insomnia  3/13- too sedated with trazodone 25 mg- will try melatonin 6 mg QHS.   3/14- slept great with melatonin- will con't and sugfest after d/c- no hangover affect   LOS: 13 days A FACE TO FACE EVALUATION WAS PERFORMED  Meredith Staggers 10/19/2019, 12:52 PM

## 2019-10-19 NOTE — Progress Notes (Signed)
Occupational Therapy Session Note  Patient Details  Name: Alison Whitehead MRN: [id number] Date of Birth: [birth year]  Today's Date: 10/19/2019 OT Individual Time: 0700-0810 OT Individual Time Calculation (min): 70 min    Short Term Goals: Week 1:  OT Short Term Goal 1 (Week 1): Pt will transfer to toilet wiht CGA OT Short Term Goal 2 (Week 1): Pt will groomin in standing wiht S OT Short Term Goal 3 (Week 1): Pt will complete toileting with CGA  Skilled Therapeutic Interventions/Progress Updates:    1;1. Pt received in bed requiring time to arouse. Pt reporting pain in shoulder and muscle rub applied by RN. Pt completes transfer with RW at ambulatory level with S/VC for keeping feet inside RW and having RW closer for sit to stand transitions. Pt competes toileting with S, bathing with S ( VC for brake management in w/c) and dressing with S-set up after pt gathers items at ambulatory level with S. Pt completes toieting second time after bathing/dressing for BM with S overall and VC for upright posture in stance. Pt BP checked 120/99. Rn aware. Edu re taking off teds d/t elevated BP. Pt verbalized understanding. Exited session with pt seated in bed, exit alarm on and call light in reach  Therapy Documentation Precautions:  Precautions Precautions: Fall, Other (comment) Precaution Comments: watch BP (MD parameters <140/80 and was orthostatic at eval); can be impulsive Restrictions Weight Bearing Restrictions: No General:   Vital Signs: Therapy Vitals Temp: 97.7 F (36.5 C) Pulse Rate: 68 BP: 122/86 Patient Position (if appropriate): Lying Oxygen Therapy SpO2: 98 % O2 Device: Room Air Pain:   ADL: ADL Eating: Set up Grooming: Minimal assistance(standing) Upper Body Bathing: Supervision/safety Where Assessed-Upper Body Bathing: Sitting at sink, Wheelchair Lower Body Bathing: Minimal assistance Where Assessed-Lower Body Bathing: Wheelchair, Sitting at sink Upper Body  Dressing: Supervision/safety Where Assessed-Upper Body Dressing: Sitting at sink, Wheelchair Lower Body Dressing: Minimal assistance Where Assessed-Lower Body Dressing: Sitting at sink, Standing at sink, Wheelchair Toileting: Minimal assistance Where Assessed-Toileting: Bedside Commode Toilet Transfer: Minimal assistance Toilet Transfer Method: Insurance claims handler Equipment: Bedside commode(over toileti wiht RW) Vision   Perception    Praxis   Exercises:   Other Treatments:     Therapy/Group: Individual Therapy  Tonny Branch 10/19/2019, 7:10 AM

## 2019-10-19 NOTE — Progress Notes (Signed)
Social Work Patient ID: Alison Whitehead, female   DOB: Dec 27, 1950, 69 y.o.   MRN: [id number]    SW received phone call from pt dtr Josefina Do [phone]) who called asking up about updates from team conference and plan of care as she is aware SW has been speaking with her aunt. SW provided updates from team conference. SW informed since pt is placement, as this time we are continuing to explore options and will begin to branch out to other counties such as [place] area. She stated she prefers if pt can stay in [place], but understands if she has to be in another county. Open to [place] area. SW indicated will follow-up once there are more updates.   SW left message for Renaee Munda with [facility] [phone]) about referral. SW waiting on follow-up.   SW called [facility] and informed facility closing.   SW left message for [facility]/Admissions Director with [facility] [phone]) to discuss referral sent in [place]. SW waiting on follow-up.  *SW spoke with Adela Lank who indicated will review and will speak with Dir. Of RN, and will determine if able to accept. SW waiting on follow-up.   Loralee Pacas, MSW, [place] Office: [phone] Cell: [phone] Fax: [phone]

## 2019-10-19 NOTE — Progress Notes (Addendum)
Physical Therapy Note  Patient Details  Name: Alison Whitehead MRN: [id number] Date of Birth: [birth year] Today's Date: 10/19/2019    Returned to pt's room at 11:38 am to make up missed time from this morning. Pt lying in bed reporting she still doesn't feel well, declining bed level exercises, & is waiting to see the PA, still complaining she feels like she did when she had her stroke. Pt left in bed with all needs at hand. Nurse made aware of pt's complaints.   Waunita Schooner 10/19/2019, 11:59 AM

## 2019-10-19 NOTE — Progress Notes (Signed)
Patient refused miralax this morning, no complications noted at this time. Patient states she feels gassy and wil try and have a bowel movement on her own. Having bowel movement at this moment. Continue plan of care. Audie Clear, LPN

## 2019-10-19 NOTE — Progress Notes (Signed)
Speech Language Pathology Daily Session Note  Patient Details  Name: Brylynn Boehringer MRN: [id number] Date of Birth: August 23, 1950  Today's Date: 10/19/2019 SLP Missed Time: 46 Minutes Missed Time Reason: Patient ill (Comment)  Pt missed 60 minutes of skilled ST d/t "not feeling well."   Dorrian Doggett 10/19/2019, 2:20 PM

## 2019-10-20 ENCOUNTER — Inpatient Hospital Stay (HOSPITAL_COMMUNITY): Payer: Medicare HMO | Admitting: Occupational Therapy

## 2019-10-20 ENCOUNTER — Inpatient Hospital Stay (HOSPITAL_COMMUNITY): Payer: Medicare HMO | Admitting: Physical Therapy

## 2019-10-20 ENCOUNTER — Inpatient Hospital Stay (HOSPITAL_COMMUNITY): Payer: Medicare HMO | Admitting: Speech Pathology

## 2019-10-20 LAB — GLUCOSE, CAPILLARY
Glucose-Capillary: 87 mg/dL (ref 70–99)
Glucose-Capillary: 132 mg/dL — ABNORMAL HIGH (ref 70–99)
Glucose-Capillary: 127 mg/dL — ABNORMAL HIGH (ref 70–99)
Glucose-Capillary: 103 mg/dL — ABNORMAL HIGH (ref 70–99)

## 2019-10-20 MED ORDER — AMLODIPINE BESYLATE 5 MG PO TABS
5.0000 mg | ORAL_TABLET | Freq: Every day | ORAL | Status: DC
  Administered 2019-10-20 – 2019-10-22 (×3): 5 mg via ORAL
  Filled 2019-10-20 (×3): qty 1

## 2019-10-20 NOTE — Progress Notes (Signed)
Nutrition Follow-up  DOCUMENTATION CODES:   Not applicable  INTERVENTION:   Continue Ensure Enlive po BID, each supplement provides 350 kcal and 20 grams of protein  Continue snacks TID  Continue MVI daily   NUTRITION DIAGNOSIS:   Increased nutrient needs related to other (see comment)(therapies) as evidenced by estimated needs.  Ongoing.  GOAL:   Patient will meet greater than or equal to 90% of their needs  Progressing.  MONITOR:   PO intake, Supplement acceptance, Weight trends, Labs  REASON FOR ASSESSMENT:   Malnutrition Screening Tool    ASSESSMENT:   Pt with a PMH of HTN, IBS, OA bilateral knees, cocaine abuse who was admitted on 10/01/19 with onset of weakness and dizziness followed by N/V. UDS positive for opiates and cocaine. 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  CT abdomen pelvis showed enlarged fibroid uterus with fairly extensive diverticulosis. Pt admitted to [facility] 10/06/19.  Pt reports appetite is good and that she is enjoying the food more. Pt states she loves her snacks and Ensures.  PO intake: 0-100% x last 8 recorded meals (43% average intake)  Medications reviewed and include: ensure Enlive BID, SSI, MVI, Miralax, Senokot-S  Labs reviewed. CBGs 87-132   Diet Order:   Diet Order            Diet heart healthy/carb modified Room service appropriate? Yes; Fluid consistency: Thin  Diet effective now              EDUCATION NEEDS:   No education needs have been identified at this time  Skin:  Skin Assessment: Reviewed RN Assessment  Last BM:  3/17  Height:   Ht Readings from Last 1 Encounters:  10/06/19 5\' 7"  (1.702 m)    Weight:   Wt Readings from Last 1 Encounters:  10/13/19 85.5 kg    BMI:  Body mass index is  29.52 kg/m.  Estimated Nutritional Needs:   Kcal:  1700-1900  Protein:  85-95 grams  Fluid:  >/= 1.7L/d   Larkin Ina, MS, RD, LDN RD pager number and weekend/on-call pager number located in [place].

## 2019-10-20 NOTE — Progress Notes (Signed)
Physical Therapy Session Note  Patient Details  Name: Alison Whitehead MRN: [id number] Date of Birth: 11-22-1950  Today's Date: 10/20/2019 PT Individual Time: 1350-1458 PT Individual Time Calculation (min): 68 min   Short Term Goals: Week 2:  PT Short Term Goal 1 (Week 2): =LTGs due to ELOS  Skilled Therapeutic Interventions/Progress Updates:   Pt in recliner and agreeable to therapy, no c/o pain. Pt's BP running 0000000 systolic today. Per verbal conversation with PA-C, pt ok to participate as long as BP remains [id number] systolic. BP checked at beginning of session, 142/88. Sit<>stand to RW w/ supervision. Pt ambulated to/from therapy gym w/ supervision using RW. Verbal cues to attend to task, pt easily distracted by environment. Worked on standing endurance and functional balance. Stood in multiple 3-4 min bouts w/ supervision w/o AD while performing bimanual cognitive tasks. First task was vertical puzzle, min cues for identification of errors and problem solving. Performed 2 other tasks - simple pipe tree puzzle. Required pt to put together puzzle from working memory of picture. Pt unable to keep picture in her mind for any significant amount of time to complete pipe tree. Pt w/ increased awareness of her memory deficits after this task. Pt only successful after being able to copy picture directly in front of her. Worked on balance w/ tossing horseshoes, going to pick up horseshoes from floor, and picking up horseshoes placed at various areas around gym, all w/o AD. Needed CGA to pick up horseshoes and to ambulate around gym w/o AD, verbal cues for precautions and safety. Ambulated back to room and ended session in recliner, all needs in reach. BP after session 140/97 and no signs or symptoms of hypertension.   Therapy Documentation Precautions:  Precautions Precautions: Fall, Other (comment) Precaution Comments: watch BP (MD parameters <140/80 and was orthostatic at eval); can be  impulsive Restrictions Weight Bearing Restrictions: No Vital Signs: Therapy Vitals Temp: (!) 97.5 F (36.4 C) Pulse Rate: 71 Resp: 16 BP: (!) 142/88 Patient Position (if appropriate): Sitting Oxygen Therapy SpO2: 100 % O2 Device: Room Air Pain: Pain Assessment Pain Scale: 0-10 Pain Score: 0-No pain Faces Pain Scale: No hurt  Therapy/Group: Individual Therapy  Berley Gambrell K [place] Strege 10/20/2019, 3:00 PM

## 2019-10-20 NOTE — Progress Notes (Signed)
Alison Whitehead PHYSICAL MEDICINE & REHABILITATION PROGRESS NOTE   Subjective/Complaints:  Working with OT on bathing. Feeling better today after episode yesterday morning  ROS: Patient denies fever, rash, sore throat, blurred vision, nausea, vomiting, diarrhea, cough, shortness of breath or chest pain, joint or back pain, headache, or mood change.     Objective:   No results found. Recent Labs    10/18/19 0638  WBC 4.8  HGB 11.0*  HCT 35.3*  PLT 200   Recent Labs    10/19/19 1235  NA 136  K 4.4  CL 104  CO2 24  GLUCOSE 100*  BUN 31*  CREATININE 1.09*  CALCIUM 8.8*    Intake/Output Summary (Last 24 hours) at 10/20/2019 1018 Last data filed at 10/20/2019 0836 Gross per 24 hour  Intake 820 ml  Output -  Net 820 ml     Physical Exam: Vital Signs Blood pressure (!) 141/91, pulse 72, temperature 97.8 F (36.6 C), resp. rate 19, height 5\' 7"  (1.702 m), weight 85.5 kg, SpO2 100 %. Constitutional: No distress . Vital signs reviewed. HEENT: EOMI, oral membranes moist Neck: supple Cardiovascular: RRR without murmur. No JVD    Respiratory/Chest: CTA Bilaterally without wheezes or rales. Normal effort    GI/Abdomen: BS +, non-tender, non-distended Ext: no clubbing, cyanosis, or edema Skin: Warm and dry.  Intact. Psych: very pleasant, sl anxious Musc: No edema in extremities.  No tenderness in extremities. Neuro: Alert Motor: Grossly 4+/5 throughout RUE and RLE. LUE and LLE 4 to 4+/5. Fair [place]. Senses pain and light touch. Decreased STM and attention--near current baseline.   Assessment/Plan: 1. Functional deficits secondary to right posterior pontine infarct which require 3+ hours per day of interdisciplinary therapy in a comprehensive inpatient rehab setting.  Physiatrist is providing close team supervision and 24 hour management of active medical problems listed below.  Physiatrist and rehab team continue to assess barriers to discharge/monitor patient progress toward  functional and medical goals  Care Tool:  Bathing    Body parts bathed by patient: Right arm, Left arm, Chest, Abdomen, Front perineal area, Buttocks, Right upper leg, Left upper leg, Right lower leg, Left lower leg, Face         Bathing assist Assist Level: Supervision/Verbal cueing     Upper Body Dressing/Undressing Upper body dressing   What is the patient wearing?: Pull over shirt    Upper body assist Assist Level: Independent with assistive device    Lower Body Dressing/Undressing Lower body dressing      What is the patient wearing?: Pants, Underwear/pull up     Lower body assist Assist for lower body dressing: Supervision/Verbal cueing     Toileting Toileting Toileting Activity did not occur (Clothing management and hygiene only): N/A (no void or bm)  Toileting assist Assist for toileting: Supervision/Verbal cueing     Transfers Chair/bed transfer  Transfers assist     Chair/bed transfer assist level: Supervision/Verbal cueing     Locomotion Ambulation   Ambulation assist   Ambulation activity did not occur: Safety/medical concerns(orthostatic)  Assist level: Supervision/Verbal cueing Assistive device: Walker-rolling Max distance: 150   Walk 10 feet activity   Assist  Walk 10 feet activity did not occur: Safety/medical concerns(orthostatic)  Assist level: Supervision/Verbal cueing Assistive device: Walker-rolling   Walk 50 feet activity   Assist Walk 50 feet with 2 turns activity did not occur: Safety/medical concerns(orthostatic)  Assist level: Supervision/Verbal cueing Assistive device: Walker-rolling    Walk 150 feet activity   Assist  Walk 150 feet activity did not occur: Safety/medical concerns(orthostatic)  Assist level: Supervision/Verbal cueing Assistive device: Walker-rolling    Walk 10 feet on uneven surface  activity   Assist Walk 10 feet on uneven surfaces activity did not occur: Safety/medical  concerns(orthostatic)   Assist level: Supervision/Verbal cueing     Wheelchair     Assist Will patient use wheelchair at discharge?: No   Wheelchair activity did not occur: N/A  Wheelchair assist level: Moderate Assistance - Patient 50 - 74% Max wheelchair distance: 64ft    Wheelchair 50 feet with 2 turns activity    Assist    Wheelchair 50 feet with 2 turns activity did not occur: N/A       Wheelchair 150 feet activity     Assist  Wheelchair 150 feet activity did not occur: N/A       Blood pressure (!) 141/91, pulse 72, temperature 97.8 F (36.6 C), resp. rate 19, height 5\' 7"  (1.702 m), weight 85.5 kg, SpO2 100 %.  Medical Problem List and Plan: 1.  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   Plan is for SNF 2.  Antithrombotics: -DVT/anticoagulation:  Pharmaceutical: Lovenox 3/14- will con't until d/c- since pt should be walking well enough             -antiplatelet therapy: ASA 3. Persistent HA/Pain Management: Will change oxycodone to ultram prn. Add topamax to treat HA.             -3/15 Headache controlled with topamax 50mg  bid 4. Mood: LCSW to follow for evaluation and support.              -antipsychotic agents: N/A. Appreciate neuropsych evaluation.  5. Neuropsych: This patient is not capable of making decisions on her own behalf. 6. Skin/Wound Care: Routine pressure relief measures.  7. Fluids/Electrolytes/Nutrition: encourage po 8. HTN: Monitor BP   HCTZ, amlodipine DC'd  Norvasc, coreg bid, Bidil, lisinopril, Zestril. Has not taken medications for months. Will need to monitor for hypotension as medications get to steady state.             -need to keep BP <140/80 per Neurology   Lisinopril decreased to 10 on 3/8.   + Orthostasis on 3/8 - ACE wraps/Abd binder as necessary  3/9: lisinopril decreased to 5mg .   3/10:  Currently well controlled; maintain current dosage. Continue ACE and abdominal binder during therapy.   3/11: Well controlled thorughout therapy sessions yesterday  Encourage fluids  3/13- BP 110/77- hanging out in 110s- no Sx's  3/14- SBP 110s-120s- doing well con't meds- no hypotension/Sx;s today  3/17 low bp today--holding meds, check labs, recheck vs   -may be volume depleted.   3/18 BP up today, probably hypotensive and volume depleted yesterday   -resume norvasc today   -push fluids   -recheck BMET Friday 9. OA bilateral knees: continue Voltaren gel to knees qid. Avoid narcotics due to ongoing polysubstance abuse.  10. AKI:   See #8  -encourage fluids  Creatinine 1.42 on 3/8  IVF nightly x2 nights started 3/8, echocardiogram with ejection fraction of 55 to 60%  3/18 prerenal, volume depleted yesterday--recheck tomorrow 11. Prediabetes: Hgb A1c- 6.2. Will monitor BS ac/hs and have RD educate on CM diet.         well controlled.  12. Nausea: scopolamine patch, prn compazine. Will add prn zofran as well. EKG tomorrow  morning to monitor qTC.   3/12: continues to be nauseous this morning. Will not increase medications further given qTC prolongation.   3/16 more nauseas at night apparently. Continue above medications. Did not mention a word re: nausea the last two visits with me 13. Dizziness: scheduled meclizine 14. qTC prlonged on EKG today. Zofran d/ced.  15/ Insomnia  3/13- too sedated with trazodone 25 mg- will try melatonin 6 mg QHS.   improved with melatonin- will con't and sugfest after d/c- no hangover affect   LOS: 14 days A FACE TO FACE EVALUATION WAS PERFORMED  Meredith Staggers 10/20/2019, 10:18 AM

## 2019-10-20 NOTE — Progress Notes (Signed)
Occupational Therapy Session Note  Patient Details  Name: Alison Whitehead MRN: [id number] Date of Birth: 03-08-1951  Today's Date: 10/20/2019 OT Individual Time: 1281-1886 OT Individual Time Calculation (min): 58 min    Short Term Goals: Week 1:  OT Short Term Goal 1 (Week 1): Pt will transfer to toilet wiht CGA OT Short Term Goal 2 (Week 1): Pt will groomin in standing wiht S OT Short Term Goal 3 (Week 1): Pt will complete toileting with CGA  Skilled Therapeutic Interventions/Progress Updates:  Patient met lying supine in bed in agreement with OT treatment session. Patient indicated need for void/BM with supervision A for toilet transfer, toileting/hygiene/clothing management. Patient requesting to bathe in agreement with sponge bath seated EOB. Moderately distracting environment in semi-private room with RN present to administer medication during bathing. Patient demonstrates decreased alternating attention requiring increased vc's for attention to task. Patient independent for UB bath/dress but still requiring supervision A for safety with LB bath/dress in standing with use of RW. Patient left seated in recliner with call bell within reach, chair alarm activated, and all needs met.    Therapy Documentation Precautions:  Precautions Precautions: Fall, Other (comment) Precaution Comments: watch BP (MD parameters <140/80 and was orthostatic at eval); can be impulsive Restrictions Weight Bearing Restrictions: No Vital Signs:   Therapy/Group: Individual Therapy  Sharday Michl R [place]Davis 10/20/2019, 7:48 AM

## 2019-10-20 NOTE — Progress Notes (Addendum)
Social Work Patient ID: Alison Whitehead, female   DOB: 02-01-1951, 69 y.o.   MRN: [id number]   SW left message for [facility]/Admissions Director with [facility] [phone]) to discuss if able to accept pt.   SWleft message forKatie Osmund/Admissions with [facility] [phone]) about referral.SW waiting on follow-up.  *Will review referral and follow-up.   SW made efforts to contact with Admissions with [facility] but no answer or voicemail. SW will continue to make efforts.   SW left message for [facility] [phone]) and left message about referral in Epic. SW waiting on follow-up.   Loralee Pacas, MSW, [place] Office: [phone] Cell: [phone] Fax: [phone]

## 2019-10-20 NOTE — Progress Notes (Signed)
Speech Language Pathology Weekly Progress and Session Note  Patient Details  Name: Alison Whitehead MRN: [id number] Date of Birth: 11-Feb-1951  Beginning of progress report period: October 14, 2019 End of progress report period: October 20, 2019  Today's Date: 10/20/2019 SLP Individual Time: 1025-1050 SLP Individual Time Calculation (min): 25 min  Short Term Goals: Week 2: SLP Short Term Goal 1 (Week 2): STGs=LTGs due to ELOS SLP Short Term Goal 1 - Progress (Week 2): Progressing toward goal    New Short Term Goals: Week 3: SLP Short Term Goal 1 (Week 3): STGs=LTGs due to ELOS  Weekly Progress Updates:  Patient continues to make progress towards LTGs. Currently, patient requires overall Mod A verbal cues to complete mildly complex tasks in regards to problem solving. Min-Mod A verbal cues are also needed for recall of functional information with use of compensatory strategies. Patient demonstrates improved selective attention to tasks and improved intellectual awareness of cognitive deficits. Patient and family education ongoing. Patient's family is unable to provide the necessary level of assistance needed at this time (24/7), therefore, patient is awaiting SNF placement.    Intensity: Minumum of 1-2 x/day, 30 to 90 minutes Frequency: 3 to 5 out of 7 days Duration/Length of Stay: TBD due to SNF placement  Treatment/Interventions: Cognitive remediation/compensation;Internal/external aids;Cueing hierarchy;Functional tasks;Patient/family education;Therapeutic Activities;Environmental controls   Daily Session  Skilled Therapeutic Interventions: Skilled treatment session focused on cognitive goals. SLP facilitated session by providing extra time and Min A verbal cues for complex organization and problem solving during a scheduling task. Patient did significantly better on this task compared to Tuesday's session, patient reported due to increased attention and less impulsivity with task.  Patient left upright in recliner with alarm on and all needs within reach. Continue with current plan of care.        Pain No/Denies Pain   Therapy/Group: Individual Therapy  Keilen Kahl 10/20/2019, 7:09 AM

## 2019-10-20 NOTE — Plan of Care (Signed)
Pt frequency downgraded to 1x/day for each discipline 2/2 awaiting SNF bed offer.   Burnard Bunting, PT, DPT

## 2019-10-20 NOTE — Plan of Care (Signed)
  Problem: RH Problem Solving Goal: LTG Patient will demonstrate problem solving for (SLP) Description: LTG:  Patient will demonstrate problem solving for basic/complex daily situations with cues  (SLP) Flowsheets (Taken 10/20/2019 0708) LTG Patient will demonstrate problem solving for: Minimal Assistance - Patient > 75% Note: Downgraded due to lack of progress, CMP   Problem: RH Memory Goal: LTG Patient will demonstrate ability for day to day (SLP) Description: LTG:   Patient will demonstrate ability for day to day recall/carryover during cognitive/linguistic activities with assist  (SLP) Flowsheets (Taken 10/20/2019 0708) LTG: Patient will demonstrate ability for day to day recall/carryover during cognitive/linguistic activities with assist (SLP): Minimal Assistance - Patient > 75% Note: Downgraded due to lack of progress, CMP Goal: LTG Patient will use memory compensatory aids to (SLP) Description: LTG:  Patient will use memory compensatory aids to recall biographical/new, daily complex information with cues (SLP) Flowsheets (Taken 10/20/2019 0708) LTG: Patient will use memory compensatory aids to (SLP): Minimal Assistance - Patient > 75% Note: Downgraded due to lack of progress, CMP

## 2019-10-20 NOTE — Plan of Care (Signed)
  Problem: Consults Goal: RH STROKE PATIENT EDUCATION Description: See Patient Education module for education specifics  Outcome: Progressing Goal: Nutrition Consult-if indicated Outcome: Progressing Goal: Diabetes Guidelines if Diabetic/Glucose > 140 Description: If diabetic or lab glucose is > 140 mg/dl - Initiate Diabetes/Hyperglycemia Guidelines & Document Interventions  Outcome: Progressing   Problem: RH BOWEL ELIMINATION Goal: RH STG MANAGE BOWEL WITH ASSISTANCE Description: STG Manage Bowel with  moderate Assistance. Outcome: Progressing Goal: RH STG MANAGE BOWEL W/MEDICATION W/ASSISTANCE Description: STG Manage Bowel with Medication with moderate Assistance. Outcome: Progressing   Problem: RH SKIN INTEGRITY Goal: RH STG SKIN FREE OF INFECTION/BREAKDOWN Description: Skin free from infection entire stay on rehab Outcome: Progressing Goal: RH STG ABLE TO PERFORM INCISION/WOUND CARE W/ASSISTANCE Description: STG Able To Perform Incision/Wound Care With Assistance. Outcome: Progressing   Problem: RH SAFETY Goal: RH STG ADHERE TO SAFETY PRECAUTIONS W/ASSISTANCE/DEVICE Description: STG Adhere to Safety Precautions With  moderate Assistance/Device. Outcome: Progressing   Problem: RH PAIN MANAGEMENT Goal: RH STG PAIN MANAGED AT OR BELOW PT'S PAIN GOAL Description: Pain less than 2 Outcome: Progressing   Problem: RH KNOWLEDGE DEFICIT Goal: RH STG INCREASE KNOWLEDGE OF HYPERTENSION Description: Moderate assistance Outcome: Progressing

## 2019-10-20 NOTE — Progress Notes (Signed)
Physical Therapy Session Note  Patient Details  Name: Alison Whitehead MRN: [id number] Date of Birth: 01/09/1951  Today's Date: 10/20/2019 PT Individual Time: 1100-1128 PT Individual Time Calculation (min): 28 min   Short Term Goals: Week 2:  PT Short Term Goal 1 (Week 2): =LTGs due to ELOS  Skilled Therapeutic Interventions/Progress Updates:    Patient received in chair, feeling better today and excited to participate in PT session this morning. Able to perform furniture and bathroom transfers with S and RW with VC for safety and sequencing to reduced fall risk. Tolerated gait training >150ft with RW and S but needed frequent VC for safety and to maintain BUEs on RW, also redirection due to being very easily externally distracted. Otherwise worked on functional balance based tasks including tandem stance on and off of foam pad, SLS, and narrow BOS. She was left in the recliner with chair alarm active, all other needs met this morning.   Therapy Documentation Precautions:  Precautions Precautions: Fall, Other (comment) Precaution Comments: watch BP (MD parameters <140/80 and was orthostatic at eval); can be impulsive Restrictions Weight Bearing Restrictions: No   Pain: Pain Assessment Pain Scale: 0-10 Pain Score: 0-No pain Faces Pain Scale: No hurt    Therapy/Group: Individual Therapy   Kristen U PT, DPT, PN1   Supplemental Physical Therapist Christine    Pager [phone] Acute Rehab Office [phone]    10/20/2019, 12:41 PM  

## 2019-10-21 ENCOUNTER — Inpatient Hospital Stay (HOSPITAL_COMMUNITY): Payer: Medicare HMO | Admitting: Physical Therapy

## 2019-10-21 ENCOUNTER — Inpatient Hospital Stay (HOSPITAL_COMMUNITY): Payer: Medicare HMO | Admitting: Speech Pathology

## 2019-10-21 ENCOUNTER — Inpatient Hospital Stay (HOSPITAL_COMMUNITY): Payer: Medicare HMO | Admitting: Occupational Therapy

## 2019-10-21 LAB — GLUCOSE, CAPILLARY
Glucose-Capillary: 119 mg/dL — ABNORMAL HIGH (ref 70–99)
Glucose-Capillary: 109 mg/dL — ABNORMAL HIGH (ref 70–99)
Glucose-Capillary: 123 mg/dL — ABNORMAL HIGH (ref 70–99)
Glucose-Capillary: 115 mg/dL — ABNORMAL HIGH (ref 70–99)

## 2019-10-21 MED ORDER — NAPHAZOLINE-GLYCERIN 0.012-0.2 % OP SOLN
1.0000 [drp] | Freq: Four times a day (QID) | OPHTHALMIC | Status: DC | PRN
  Filled 2019-10-21: qty 15

## 2019-10-21 NOTE — Progress Notes (Signed)
[facility] PHYSICAL MEDICINE & REHABILITATION PROGRESS NOTE   Subjective/Complaints:  Up with PT in room. "when am I going home?".  Has dry eyes. Nasal congestion better  ROS: Patient denies fever, rash, sore throat, blurred vision, nausea, vomiting, diarrhea, cough, shortness of breath or chest pain, joint or back pain, headache, or mood change.    Objective:   No results found. No results for input(s): WBC, HGB, HCT, PLT in the last 72 hours. Recent Labs    10/19/19 1235  NA 136  K 4.4  CL 104  CO2 24  GLUCOSE 100*  BUN 31*  CREATININE 1.09*  CALCIUM 8.8*    Intake/Output Summary (Last 24 hours) at 10/21/2019 1222 Last data filed at 10/21/2019 0837 Gross per 24 hour  Intake 600 ml  Output --  Net 600 ml     Physical Exam: Vital Signs Blood pressure (!) 133/92, pulse (!) 57, temperature 98.6 F (37 C), resp. rate 19, height 5\' 7"  (1.702 m), weight 85.5 kg, SpO2 98 %. Constitutional: No distress . Vital signs reviewed. HEENT: EOMI, oral membranes moist Neck: supple Cardiovascular: RRR without murmur. No JVD    Respiratory/Chest: CTA Bilaterally without wheezes or rales. Normal effort    GI/Abdomen: BS +, non-tender, non-distended Ext: no clubbing, cyanosis, or edema Skin: Warm and dry.  Intact. Psych: pleasant, a little impulsive Musc: No edema in extremities.  No tenderness in extremities. Neuro: Alert Motor: Grossly 4+/5 throughout RUE and RLE. LUE and LLE 4 to 4+/5. Fair [place]. Good standing balance with walker.  Senses pain and light touch. Distractible    Assessment/Plan: 1. Functional deficits secondary to right posterior pontine infarct which require 3+ hours per day of interdisciplinary therapy in a comprehensive inpatient rehab setting.  Physiatrist is providing close team supervision and 24 hour management of active medical problems listed below.  Physiatrist and rehab team continue to assess barriers to discharge/monitor patient progress toward  functional and medical goals  Care Tool:  Bathing    Body parts bathed by patient: Right arm, Left arm, Chest, Abdomen, Front perineal area, Buttocks, Right upper leg, Left upper leg, Right lower leg, Left lower leg, Face         Bathing assist Assist Level: Supervision/Verbal cueing     Upper Body Dressing/Undressing Upper body dressing   What is the patient wearing?: Pull over shirt    Upper body assist Assist Level: Independent with assistive device    Lower Body Dressing/Undressing Lower body dressing      What is the patient wearing?: Pants, Underwear/pull up     Lower body assist Assist for lower body dressing: Supervision/Verbal cueing     Toileting Toileting Toileting Activity did not occur (Clothing management and hygiene only): N/A (no void or bm)  Toileting assist Assist for toileting: Supervision/Verbal cueing     Transfers Chair/bed transfer  Transfers assist     Chair/bed transfer assist level: Supervision/Verbal cueing     Locomotion Ambulation   Ambulation assist   Ambulation activity did not occur: Safety/medical concerns(orthostatic)  Assist level: Supervision/Verbal cueing Assistive device: Walker-rolling Max distance: 150+ft   Walk 10 feet activity   Assist  Walk 10 feet activity did not occur: Safety/medical concerns(orthostatic)  Assist level: Supervision/Verbal cueing Assistive device: Walker-rolling   Walk 50 feet activity   Assist Walk 50 feet with 2 turns activity did not occur: Safety/medical concerns(orthostatic)  Assist level: Supervision/Verbal cueing Assistive device: Walker-rolling    Walk 150 feet activity   Assist Walk  150 feet activity did not occur: Safety/medical concerns(orthostatic)  Assist level: Supervision/Verbal cueing Assistive device: Walker-rolling    Walk 10 feet on uneven surface  activity   Assist Walk 10 feet on uneven surfaces activity did not occur: Safety/medical  concerns(orthostatic)   Assist level: Supervision/Verbal cueing     Wheelchair     Assist Will patient use wheelchair at discharge?: No   Wheelchair activity did not occur: N/A  Wheelchair assist level: Moderate Assistance - Patient 50 - 74% Max wheelchair distance: 45ft    Wheelchair 50 feet with 2 turns activity    Assist    Wheelchair 50 feet with 2 turns activity did not occur: N/A       Wheelchair 150 feet activity     Assist  Wheelchair 150 feet activity did not occur: N/A       Blood pressure (!) 133/92, pulse (!) 57, temperature 98.6 F (37 C), resp. rate 19, height 5\' 7"  (1.702 m), weight 85.5 kg, SpO2 98 %.  Medical Problem List and Plan: 1.  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    SNF pending. Pt has been told many times, STM/awareness deficits 2.  Antithrombotics: -DVT/anticoagulation:  Pharmaceutical: Lovenox 3/14- will con't until d/c- since pt should be walking well enough             -antiplatelet therapy: ASA 3. Persistent HA/Pain Management: Will change oxycodone to ultram prn. Add topamax to treat HA.             -3/15 Headache controlled with topamax 50mg  bid 4. Mood: LCSW to follow for evaluation and support.              -antipsychotic agents: N/A. Appreciate neuropsych evaluation.  5. Neuropsych: This patient is not capable of making decisions on her own behalf. 6. Skin/Wound Care: Routine pressure relief measures.  7. Fluids/Electrolytes/Nutrition: encourage po 8. HTN: Monitor BP   HCTZ, amlodipine DC'd  Norvasc, coreg bid, Bidil, lisinopril, Zestril. Has not taken medications for months. Will need to monitor for hypotension as medications get to steady state.             -need to keep BP <140/80 per Neurology   Lisinopril decreased to 10 on 3/8.   + Orthostasis on 3/8 - ACE wraps/Abd binder as  necessary  3/9: lisinopril decreased to 5mg .   3/10: Currently well controlled; maintain current dosage. Continue ACE and abdominal binder during therapy.   3/11: Well controlled thorughout therapy sessions yesterday  Encourage fluids  3/13- BP 110/77- hanging out in 110s- no Sx's  3/14- SBP 110s-120s- doing well con't meds- no hypotension/Sx;s today  3/17 low bp today--holding meds, check labs, recheck vs   -may be volume depleted.   3/18 BP up today, probably hypotensive and volume depleted yesterday   -resumed norvasc    3/19 DBP increasing. Consider resuming ACE if persistent  -recheck bmet Monday if still here 9. OA bilateral knees: continue Voltaren gel to knees qid. Avoid narcotics due to ongoing polysubstance abuse.  10. AKI:   See #8  -encourage fluids  Creatinine 1.42 on 3/8  IVF nightly x2 nights started 3/8, echocardiogram with ejection fraction of 55 to 60%  3/18 prerenal, volume depleted  3/19 recheck labs monday 11. Prediabetes: Hgb A1c- 6.2. Will monitor BS ac/hs and have RD educate on CM diet.  well controlled.  12. Nausea: scopolamine patch, prn compazine. Will add prn zofran as well. EKG tomorrow morning to monitor qTC.   3/12: continues to be nauseous this morning. Will not increase medications further given qTC prolongation.   3/16 more nauseas at night apparently. Continue above medications. Did not mention a word re: nausea the last two visits with me 13. Dizziness: scheduled meclizine 14. qTC prlonged on EKG today. Zofran d/ced.  15/ Insomnia  Improved with melatonin 6 mg QHS.       LOS: 15 days A FACE TO FACE EVALUATION WAS PERFORMED  Meredith Staggers 10/21/2019, 12:22 PM

## 2019-10-21 NOTE — Progress Notes (Signed)
Physical Therapy Weekly Progress Note  Patient Details  Name: Alison Whitehead MRN: [id number] Date of Birth: 1951/04/23  Beginning of progress report period: October 14, 2019 End of progress report period: October 21, 2019  Today's Date: 10/21/2019 PT Individual Time: 7989-2119 PT Individual Time Calculation (min): 24 min   Patient has met 4 of 9 long term goals as she is performing bed mobility independently and transferring/ambulating in household and controlled environments w/ supervision.  She continues to require cues for safe RW management and environmental safety awareness.   Patient continues to demonstrate the following deficits muscle weakness, decreased cardiorespiratoy endurance, decreased safety awareness and decreased memory and decreased standing balance, decreased postural control and decreased balance strategies and therefore will continue to benefit from skilled PT intervention to increase functional independence with mobility.  Patient progressing toward long term goals..  Continue plan of care. Controlled and household gait LTGs completed, transfer goals upgraded to mod/i, and community gait and stair goal added for community mobility and general strengthening (both at supervision level). Will continue to work towards improving safety awareness, functional memory, and global strengthening/endurance in anticipation of d/c to SNF. Pt's daughter and sister are unable to provide 24/7 supervision (necessary due to memory and safety awareness impairments) at this time.   PT Short Term Goals Week 2:  PT Short Term Goal 1 (Week 2): =LTGs due to ELOS Week 3:  PT Short Term Goal 1 (Week 3): =LTGs due to ELOS  Skilled Therapeutic Interventions/Progress Updates:   Pt seated EOB and requesting to toilet. Ambulated to/from toilet w/ supervision using RW, cues for safety w/ toilet transfer but performed w/ supervision. Set-up assist for pericare. Stood at sink to wash hands w/  supervision. Ambulated to/from therapy gym w/ supervision using RW, ambulated in multiple bouts of 100-150' to work on functional endurance. Min verbal cues for RW management and posture. Increased work of breathing this session, but able to recover within a few minutes of seated rest. Ambulated back to room and ended session in recliner, all needs in reach.   Therapy Documentation Precautions:  Precautions Precautions: Fall, Other (comment) Precaution Comments: watch BP (MD parameters <140/80 and was orthostatic at eval); can be impulsive Restrictions Weight Bearing Restrictions: No Vital Signs: Therapy Vitals BP: (!) 133/92  Therapy/Group: Individual Therapy  Shadana Pry K Vaunda Gutterman 10/21/2019, 9:06 AM

## 2019-10-21 NOTE — Progress Notes (Signed)
Social Work Patient ID: Marlissa Arkwright, female   DOB: [birth year], 69 y.o.   MRN: [id number]   SW received phone call from  [place] [phone]) stating updated clinicals are due today and would like current therapy notes. SW informed will pass along information.   Loralee Pacas, MSW, [place] Office: [phone] Cell: [phone] Fax: [phone]

## 2019-10-21 NOTE — Progress Notes (Addendum)
Social Work Patient ID: Wakita Laberge, female   DOB: 08-Mar-1951, 69 y.o.   MRN: [id number]   SW spoke with [facility] Robinson/Admissions Director with [facility] [phone]) to discuss if able to accept pt.States that she will follow-up with [employer] to get updates. SW sent updated clinicals via Epic. Reports concerns related to pt discharge plan. Will follow-up with SW after morning meeting today.  *SW left message for Rosario Adie to follow-up  [facility]- declined/not in network with insurance.   *SW received phone call from pt dtr Hoyle Sauer asking about updates. SW informed no updates as of now on a SNF bed. SW will follow-up with updates.   SW returned phone call to  [employer] (formerly [phone]) who stated she would pass referral to [facility] and ask her to follow-up with SW.   *SW spoke with Misty/Admissions with [facility] (bed offer for [facility]/[phone]). SW explained on above about possible auth being submitted by [facility] but no follow-up as of yet. SW informed will follow-up once there is more information.   Loralee Pacas, MSW, [place] Office: [phone] Cell: [phone] Fax: [phone]

## 2019-10-21 NOTE — Progress Notes (Signed)
Speech Language Pathology Daily Session Note  Patient Details  Name: Emmrie Darras MRN: [id number] Date of Birth: Jul 21, 1951  Today's Date: 10/21/2019 SLP Individual Time: 1000-1015; [id number] SLP Individual Time Calculation (min): 15 min; 14 min  Short Term Goals: Week 3: SLP Short Term Goal 1 (Week 3): STGs=LTGs due to ELOS  Skilled Therapeutic Interventions:  Skilled treatment session #1 targeted cogntion goals. Upon entering pt's room, she requested to use the bathroom. SLP provided needed assistance for use of bathroom. Pt was accurate in level of assistance that she needed. Pt able to demonstrate selective attention as room 1 was having therapy. SLP left scanning task for pt to complete as SLP was called away from pt. SLP to return to complete session later in the day.   Skilled treatment session #2 targeted cognition goals. SLP received pt attempting to call in her order for lunch. Pt required multiple cues from assistant on the phone to select alternative items d/t dietary restrictions. SLP further facilitated session by providing grocery ad with list of items. Instructions provided for pt to locate items and write price of item. Pt able to locate 2 items in 15 minutes. Task left with pt to complete. with this extended time, pt able to complete task with 75% accuracy.      Pain    Therapy/Group: Individual Therapy  Sybol Morre 10/21/2019, 3:40 PM

## 2019-10-21 NOTE — Plan of Care (Signed)
  Problem: Consults Goal: RH STROKE PATIENT EDUCATION Description: See Patient Education module for education specifics  Outcome: Progressing Goal: Nutrition Consult-if indicated Outcome: Progressing Goal: Diabetes Guidelines if Diabetic/Glucose > 140 Description: If diabetic or lab glucose is > 140 mg/dl - Initiate Diabetes/Hyperglycemia Guidelines & Document Interventions  Outcome: Progressing   Problem: RH BOWEL ELIMINATION Goal: RH STG MANAGE BOWEL WITH ASSISTANCE Description: STG Manage Bowel with  moderate Assistance. Outcome: Progressing Goal: RH STG MANAGE BOWEL W/MEDICATION W/ASSISTANCE Description: STG Manage Bowel with Medication with moderate Assistance. Outcome: Progressing   Problem: RH SKIN INTEGRITY Goal: RH STG SKIN FREE OF INFECTION/BREAKDOWN Description: Skin free from infection entire stay on rehab Outcome: Progressing Goal: RH STG ABLE TO PERFORM INCISION/WOUND CARE W/ASSISTANCE Description: STG Able To Perform Incision/Wound Care With Assistance. Outcome: Progressing   Problem: RH SAFETY Goal: RH STG ADHERE TO SAFETY PRECAUTIONS W/ASSISTANCE/DEVICE Description: STG Adhere to Safety Precautions With  moderate Assistance/Device. Outcome: Progressing   Problem: RH PAIN MANAGEMENT Goal: RH STG PAIN MANAGED AT OR BELOW PT'S PAIN GOAL Description: Pain less than 2 Outcome: Progressing   Problem: RH KNOWLEDGE DEFICIT Goal: RH STG INCREASE KNOWLEDGE OF HYPERTENSION Description: Moderate assistance Outcome: Progressing

## 2019-10-21 NOTE — Plan of Care (Signed)
  Problem: RH Balance Goal: LTG Patient will maintain dynamic sitting balance (PT) Description: LTG:  Patient will maintain dynamic sitting balance with assistance during mobility activities (PT) Outcome: Completed/Met   Problem: Sit to Stand Goal: LTG:  Patient will perform sit to stand with assistance level (PT) Description: LTG:  Patient will perform sit to stand with assistance level (PT) Flowsheets (Taken 10/21/2019 0909) LTG: PT will perform sit to stand in preparation for functional mobility with assistance level: (upgraded on 3/19 due to pt progress and extended LOS - AAT) Independent with assistive device Note: upgraded on 3/19 due to pt progress and extended LOS - AAT   Problem: RH Bed Mobility Goal: LTG Patient will perform bed mobility with assist (PT) Description: LTG: Patient will perform bed mobility with assistance, with/without cues (PT). Outcome: Completed/Met   Problem: RH Bed to Chair Transfers Goal: LTG Patient will perform bed/chair transfers w/assist (PT) Description: LTG: Patient will perform bed to chair transfers with assistance (PT). Flowsheets (Taken 10/21/2019 0909) LTG: Pt will perform Bed to Chair Transfers with assistance level: (upgraded on 3/19 due to pt progress and extended LOS - AAT) Independent with assistive device  Note: upgraded on 3/19 due to pt progress and extended LOS - AAT   Problem: RH Ambulation Goal: LTG Patient will ambulate in controlled environment (PT) Description: LTG: Patient will ambulate in a controlled environment, # of feet with assistance (PT). Outcome: Completed/Met Goal: LTG Patient will ambulate in home environment (PT) Description: LTG: Patient will ambulate in home environment, # of feet with assistance (PT). Outcome: Completed/Met   Problem: RH Ambulation Goal: LTG Patient will ambulate in community environment (PT) Description: LTG: Patient will ambulate in community environment, # of feet with assistance  (PT). Flowsheets (Taken 10/21/2019 0911) LTG: Pt will ambulate in community environ  assist needed:: Supervision/Verbal cueing   Problem: RH Stairs Goal: LTG Patient will ambulate up and down stairs w/assist (PT) Description: LTG: Patient will ambulate up and down # of stairs with assistance (PT) Flowsheets (Taken 10/21/2019 0911) LTG: Pt will ambulate up/down stairs assist needed:: Supervision/Verbal cueing LTG: Pt will  ambulate up and down number of stairs: 12 steps w/ unilateral rail

## 2019-10-21 NOTE — Progress Notes (Signed)
Social Work Patient ID: Alison Whitehead, female   DOB: 1951/01/11, 69 y.o.   MRN: [id number]   SW sent out pt referral for SNF placement in [facility] area as requested.   Referral was faxed to: [facility] spoke with AD Jessica [phone]. SW waiting on follow-up.  Referral faxed to: [facility] faxed to [place]. [phone]. SW waiting on follow-up  Referral faxed to [facility]. Spoke with acting AD Truity. [phone]. SW waiting on follow up.  Referral faxed to [place], Edwena Blow. [phone]. AD waiting to review referral and will follow up with SW. SW waiting on follow-up.  Referral faxed to [place], [place]. [phone]. SW waiting on  Follow-up from AD.  Referral faxed to [place] [facility]. SW waiting on follow up from [place], Amber. [phone].   Referral faxed to [place], April. [phone]. SW waiting on follow up from AD.  Referral faxed to [facility]. [phone]. SW waiting on follow-up.    Erlene Quan, [place] Office: [phone] Cell: [phone] Fax: [phone]

## 2019-10-21 NOTE — Progress Notes (Signed)
Occupational Therapy Session Note  Patient Details  Name: Alison Whitehead MRN: [id number] Date of Birth: 1950/10/13  Today's Date: 10/21/2019 OT Individual Time: 1130-1200 OT Individual Time Calculation (min): 30 min    Short Term Goals: Week 1:  OT Short Term Goal 1 (Week 1): Pt will transfer to toilet wiht CGA OT Short Term Goal 2 (Week 1): Pt will groomin in standing wiht S OT Short Term Goal 3 (Week 1): Pt will complete toileting with CGA  Skilled Therapeutic Interventions/Progress Updates:  Patient met seated on commode in bathroom in agreement with skilled OT treatment session. Patient completed toilet transfer from [facility] over standard toilet to [place] with supervision A and vc's for proximity to RW. Patient heavily distracted by environment in room with presence of roommate, roommates family, RN, and NT demonstrating decreased attention to task and decreased safety awareness with ambulation from bathroom. OT provided education on importance of maintaining safety despite distractions in environment. Patient notes that her poor safety awareness is why her family does not want her to return home by herself as they cannot provide 24 hour supervision assistance as recommended. Patient notes frustration with process of finding placement at a SNF as she has been waiting for acceptance from a facility since 3/17. Patient in tears at completion of session about the days to come stating that she is just tired of waiting for placement. OT assured patient that LCSW is diligently seeking placement and has reached out to a number of facilities. Patient ambulated to and from therapy gym with use of RW and supervision A. BUE arm ergometer with focus on BUE shoulder mobilization, cardiopulmonary endurance, and alternating attention with light conversation between patient and therapist. Session concluded with patient seated in recliner with call bell within reach and all needs met.   Therapy  Documentation Precautions:  Precautions Precautions: Fall, Other (comment) Precaution Comments: watch BP (MD parameters <140/80 and was orthostatic at eval); can be impulsive Restrictions Weight Bearing Restrictions: No   Therapy/Group: Individual Therapy  Leeandre Nordling R [place] 10/21/2019, 11:54 AM

## 2019-10-22 ENCOUNTER — Inpatient Hospital Stay (HOSPITAL_COMMUNITY): Payer: Medicare HMO

## 2019-10-22 ENCOUNTER — Inpatient Hospital Stay (HOSPITAL_COMMUNITY): Payer: Medicare HMO | Admitting: Physical Therapy

## 2019-10-22 LAB — GLUCOSE, CAPILLARY
Glucose-Capillary: 106 mg/dL — ABNORMAL HIGH (ref 70–99)
Glucose-Capillary: 104 mg/dL — ABNORMAL HIGH (ref 70–99)
Glucose-Capillary: 89 mg/dL (ref 70–99)
Glucose-Capillary: 169 mg/dL — ABNORMAL HIGH (ref 70–99)

## 2019-10-22 MED ORDER — AMLODIPINE BESYLATE 10 MG PO TABS
10.0000 mg | ORAL_TABLET | Freq: Every day | ORAL | Status: DC
  Administered 2019-10-23 – 2019-10-29 (×7): 10 mg via ORAL
  Filled 2019-10-22: qty 1
  Filled 2019-10-22: qty 2
  Filled 2019-10-22 (×5): qty 1

## 2019-10-22 NOTE — Progress Notes (Addendum)
Physical Therapy Note  Patient Details  Name: Alison Whitehead MRN: [id number] Date of Birth: 1950-12-15 Today's Date: 10/22/2019    Patient reports she has been having elevated BPs overnight. Checked BP sitting at EOB and found to be 150/104, well outside of neurologist parameters (<140/80). Returned to supine with S and left in bed with alarm active. Medical team already aware of elevated BP. 30 minutes of therapy time missed due to medical status.   Windell Norfolk, DPT, PN1   Supplemental Physical Therapist [facility]    Pager [phone] Acute Rehab Office [phone]    10/22/2019, 10:41 AM

## 2019-10-22 NOTE — Progress Notes (Signed)
Occupational Therapy Note  Patient Details  Name: Alison Whitehead MRN: [id number] Date of Birth: 28-Jan-1951  Today's Date: 10/22/2019 OT Missed Time: 32 Minutes Missed Time Reason: (BP elevated)  BP remains elevated 131/100. RN aware. Pt remains in bed this session missing 45 min total this date of skilled OT. Will follow up as medically appropriate   Tonny Branch 10/22/2019, 2:29 PM

## 2019-10-22 NOTE — Progress Notes (Signed)
[facility] PHYSICAL MEDICINE & REHABILITATION PROGRESS NOTE   Subjective/Complaints:  Feels ok today   ROS: Patient denies CP, SOB, N/V/D.    Objective:   No results found. No results for input(s): WBC, HGB, HCT, PLT in the last 72 hours. Recent Labs    10/19/19 1235  NA 136  K 4.4  CL 104  CO2 24  GLUCOSE 100*  BUN 31*  CREATININE 1.09*  CALCIUM 8.8*    Intake/Output Summary (Last 24 hours) at 10/22/2019 1021 Last data filed at 10/21/2019 1300 Gross per 24 hour  Intake 240 ml  Output --  Net 240 ml     Physical Exam: Vital Signs Blood pressure (!) 150/104, pulse 66, temperature 98 F (36.7 C), resp. rate 18, height 5\' 7"  (1.702 m), weight 85.5 kg, SpO2 93 %. Constitutional: No distress . Vital signs reviewed. HEENT: EOMI, oral membranes moist Neck: supple Cardiovascular: RRR without murmur. No JVD    Respiratory/Chest: CTA Bilaterally without wheezes or rales. Normal effort    GI/Abdomen: BS +, non-tender, non-distended Ext: no clubbing, cyanosis, or edema Skin: Warm and dry.  Intact. Psych: pleasant, a little impulsive Musc: No edema in extremities.  No tenderness in extremities. Neuro: Alert Motor: Grossly 4+/5 throughout RUE and RLE. LUE and LLE 4 to 4+/5. Fair [place]. Good standing balance with walker.  Senses pain and light touch. Distractible    Assessment/Plan: 1. Functional deficits secondary to right posterior pontine infarct which require 3+ hours per day of interdisciplinary therapy in a comprehensive inpatient rehab setting.  Physiatrist is providing close team supervision and 24 hour management of active medical problems listed below.  Physiatrist and rehab team continue to assess barriers to discharge/monitor patient progress toward functional and medical goals  Care Tool:  Bathing    Body parts bathed by patient: Right arm, Left arm, Chest, Abdomen, Front perineal area, Buttocks, Right upper leg, Left upper leg, Right lower leg, Left lower  leg, Face         Bathing assist Assist Level: Supervision/Verbal cueing     Upper Body Dressing/Undressing Upper body dressing   What is the patient wearing?: Pull over shirt    Upper body assist Assist Level: Independent with assistive device    Lower Body Dressing/Undressing Lower body dressing      What is the patient wearing?: Pants, Underwear/pull up     Lower body assist Assist for lower body dressing: Supervision/Verbal cueing     Toileting Toileting Toileting Activity did not occur (Clothing management and hygiene only): N/A (no void or bm)  Toileting assist Assist for toileting: Supervision/Verbal cueing     Transfers Chair/bed transfer  Transfers assist     Chair/bed transfer assist level: Supervision/Verbal cueing     Locomotion Ambulation   Ambulation assist   Ambulation activity did not occur: Safety/medical concerns(orthostatic)  Assist level: Supervision/Verbal cueing Assistive device: Walker-rolling Max distance: 150+ft   Walk 10 feet activity   Assist  Walk 10 feet activity did not occur: Safety/medical concerns(orthostatic)  Assist level: Supervision/Verbal cueing Assistive device: Walker-rolling   Walk 50 feet activity   Assist Walk 50 feet with 2 turns activity did not occur: Safety/medical concerns(orthostatic)  Assist level: Supervision/Verbal cueing Assistive device: Walker-rolling    Walk 150 feet activity   Assist Walk 150 feet activity did not occur: Safety/medical concerns(orthostatic)  Assist level: Supervision/Verbal cueing Assistive device: Walker-rolling    Walk 10 feet on uneven surface  activity   Assist Walk 10 feet on uneven  surfaces activity did not occur: Safety/medical concerns(orthostatic)   Assist level: Supervision/Verbal cueing     Wheelchair     Assist Will patient use wheelchair at discharge?: No   Wheelchair activity did not occur: N/A  Wheelchair assist level: Moderate  Assistance - Patient 50 - 74% Max wheelchair distance: 25ft    Wheelchair 50 feet with 2 turns activity    Assist    Wheelchair 50 feet with 2 turns activity did not occur: N/A       Wheelchair 150 feet activity     Assist  Wheelchair 150 feet activity did not occur: N/A       Blood pressure (!) 150/104, pulse 66, temperature 98 F (36.7 C), resp. rate 18, height 5\' 7"  (1.702 m), weight 85.5 kg, SpO2 93 %.  Medical Problem List and Plan: 1.  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    SNF pending. Pt has been told many times, STM/awareness deficits 2.  Antithrombotics: -DVT/anticoagulation:  Pharmaceutical: Lovenox 3/14- will con't until d/c- since pt should be walking well enough             -antiplatelet therapy: ASA 3. Persistent HA/Pain Management: Will change oxycodone to ultram prn. Add topamax to treat HA.             -3/15 Headache controlled with topamax 50mg  bid 4. Mood: LCSW to follow for evaluation and support.              -antipsychotic agents: N/A. Appreciate neuropsych evaluation.  5. Neuropsych: This patient is not capable of making decisions on her own behalf. 6. Skin/Wound Care: Routine pressure relief measures.  7. Fluids/Electrolytes/Nutrition: encourage po 8. HTN: Monitor BP   HCTZ, amlodipine DC'd  Norvasc, coreg bid, Bidil, lisinopril, Zestril. Has not taken medications for months. Will need to monitor for hypotension as medications get to steady state.             - Vitals:   10/22/19 1010 10/22/19 1027  BP: (!) 150/104 (!) 134/91  Pulse: 66 72  Resp:    Temp:    SpO2:    Some lability persisit elevated diastolic increase amlodipine in am  9. OA bilateral knees: continue Voltaren gel to knees qid. Avoid narcotics due to ongoing polysubstance abuse.  10. AKI:   Improved Crat nl , BUN still elevated   3/19 recheck  labs monday 11. Prediabetes: Hgb A1c- 6.2. Will monitor BS ac/hs and have RD educate on CM diet.         well controlled.  12. Nausea: scopolamine patch, prn compazine. Improved appetite better as well  13. Dizziness: scheduled meclizine 14. qTC prlonged on EKG today. Zofran d/ced.  15/ Insomnia  Improved with melatonin 6 mg QHS.       LOS: 16 days A FACE TO FACE EVALUATION WAS PERFORMED  Charlett Blake 10/22/2019, 10:21 AM

## 2019-10-22 NOTE — Progress Notes (Signed)
Occupational Therapy Session Note  Patient Details  Name: Leeandra Oelke MRN: [id number] Date of Birth: 1950-12-14  Today's Date: 10/22/2019 OT Individual Time: [id number] OT Individual Time Calculation (min): 15 min    Short Term Goals: Week 1:  OT Short Term Goal 1 (Week 1): Pt will transfer to toilet wiht CGA OT Short Term Goal 2 (Week 1): Pt will groomin in standing wiht S OT Short Term Goal 3 (Week 1): Pt will complete toileting with CGA  Skilled Therapeutic Interventions/Progress Updates:    1:1. Pt received in bed reporting elevated BP overnight. Edu re not putting teds on with elevated BP. Pt reporting urgency to use bathroom and no pain. Pt completes functional mobility to bathroom with RW and S for all components of toileting. Pt BP assessed after toileting 164/114! RN alerted and delivers medication. Pt returned to bed in supine and session terminated d/t elevated BP. Pt attempted to ambulate without RW and required MAX VC for determine safety issue prior to returning to bed. Exited sesssion with pt seated in bed, exit alram on and call light in reach  Therapy Documentation Precautions:  Precautions Precautions: Fall, Other (comment) Precaution Comments: watch BP (MD parameters <140/80 and was orthostatic at eval); can be impulsive Restrictions Weight Bearing Restrictions: No General:   Vital Signs: Therapy Vitals Temp: 98 F (36.7 C) Pulse Rate: 75 Resp: 18 BP: (!) 147/97 Patient Position (if appropriate): Standing Oxygen Therapy SpO2: 93 % O2 Device: Room Air Pain:   ADL: ADL Eating: Set up Grooming: Minimal assistance(standing) Upper Body Bathing: Supervision/safety Where Assessed-Upper Body Bathing: Sitting at sink, Wheelchair Lower Body Bathing: Minimal assistance Where Assessed-Lower Body Bathing: Wheelchair, Sitting at sink Upper Body Dressing: Supervision/safety Where Assessed-Upper Body Dressing: Sitting at sink, Wheelchair Lower Body  Dressing: Minimal assistance Where Assessed-Lower Body Dressing: Sitting at sink, Standing at sink, Wheelchair Toileting: Minimal assistance Where Assessed-Toileting: Bedside Commode Toilet Transfer: Minimal assistance Toilet Transfer Method: Insurance claims handler Equipment: Bedside commode(over toileti wiht RW) Vision   Perception    Praxis   Exercises:   Other Treatments:     Therapy/Group: Individual Therapy  Tonny Branch 10/22/2019, 8:16 AM

## 2019-10-23 ENCOUNTER — Inpatient Hospital Stay (HOSPITAL_COMMUNITY): Payer: Medicare HMO | Admitting: Physical Therapy

## 2019-10-23 ENCOUNTER — Inpatient Hospital Stay (HOSPITAL_COMMUNITY): Payer: Medicare HMO | Admitting: Speech Pathology

## 2019-10-23 LAB — GLUCOSE, CAPILLARY
Glucose-Capillary: 134 mg/dL — ABNORMAL HIGH (ref 70–99)
Glucose-Capillary: 112 mg/dL — ABNORMAL HIGH (ref 70–99)
Glucose-Capillary: 93 mg/dL (ref 70–99)
Glucose-Capillary: 149 mg/dL — ABNORMAL HIGH (ref 70–99)

## 2019-10-23 NOTE — Progress Notes (Addendum)
Physical Therapy Session Note  Patient Details  Name: Alison Whitehead MRN: [id number] Date of Birth: 27-Jun-1951  Today's Date: 10/23/2019 PT Individual Time: 1004-1031 PT Individual Time Calculation (min): 27 min   Short Term Goals: Week 3:  PT Short Term Goal 1 (Week 3): =LTGs due to ELOS  Skilled Therapeutic Interventions/Progress Updates:   First session:  Spoke w/ nursing re: BP this AM.  Reports w/in acceptable limits (<150 bpm).  Pt performed sit to stand transfers w/ supervision from multiple surfaces.  Pt amb to gym w/ supervision and RW, verbal cues for posture and walker management.  Pt performed multiple obstacle course negotiation w/ RW and w/o AD including bending to pick up cones from floor.  Pt states no dizziness.  Pt amb >150' back to room and BP checked at 150/69.  Pt amb into BR and sat w/o LOB.  Pt washed hands at sink w/o c/o.  Pt returned to bed w/ alarm on and all needs in reach.  Second session:  Pt presents supine in bed but sits up when entering room.  Pt states HA of 4/10, but has not called for meds.  BP taken supine 139/89, sitting 134/94 and standing 139/96 w/o symptoms.  Pt agreeable to amb w/ RW and supervision.  Pt requires  Verbal cues for posture and walker management, especially w/ turns to return to seat.  Pt performed multiple trials of 120'.  BP after gait 141/91 w/o c/o dizziness.  Pt returned to bed w/ all needs in reach and bed alarm on.     Therapy Documentation Precautions:  Precautions Precautions: Fall, Other (comment) Precaution Comments: watch BP (MD parameters <140/80 and was orthostatic at eval); can be impulsive Restrictions Weight Bearing Restrictions: No General:   Vital Signs: Therapy Vitals Pulse Rate: 72 Resp: 18 BP: (!) 141/98 Patient Position (if appropriate): Lying Oxygen Therapy SpO2: 99 % O2 Device: Room Air Pain:  0/10 pain reported 1st session, 4/10 HA 2nd session.    Therapy/Group: Individual  Therapy  Ladoris Gene 10/23/2019, 10:51 AM

## 2019-10-23 NOTE — Progress Notes (Signed)
Speech Language Pathology Daily Session Note  Patient Details  Name: Alison Whitehead MRN: [id number] Date of Birth: 04-25-1951  Today's Date: 10/23/2019 SLP Individual Time: 1140-1205 SLP Individual Time Calculation (min): 25 min  Short Term Goals: Week 3: SLP Short Term Goal 1 (Week 3): STGs=LTGs due to ELOS  Skilled Therapeutic Interventions:  Pt was seen for skilled ST targeting cognitive goals.  SLP facilitated the session with a novel scheduling task targeting problem solving goals.  Pt completed the task with min assist verbal cues to recognize and correct errors that occurred as a result of decreased attention to detail and decreased task organization.  Pt was left in bed with bed alarm set and call bell within reach.  Continue per current plan of care.    Pain Pain Assessment Pain Scale: 0-10 Pain Score: 0-No pain  Therapy/Group: Individual Therapy  Mehtab Dolberry, Selinda Orion 10/23/2019, 4:00 PM

## 2019-10-23 NOTE — Progress Notes (Signed)
[place] PHYSICAL MEDICINE & REHABILITATION PROGRESS NOTE   Subjective/Complaints:  We discussed BP meds today , no c/os, denies issues with bowels or bladder   ROS: Patient denies CP, SOB, N/V/D.    Objective:   No results found. No results for input(s): WBC, HGB, HCT, PLT in the last 72 hours. No results for input(s): NA, K, CL, CO2, GLUCOSE, BUN, CREATININE, CALCIUM in the last 72 hours.  Intake/Output Summary (Last 24 hours) at 10/23/2019 1033 Last data filed at 10/23/2019 0715 Gross per 24 hour  Intake 240 ml  Output --  Net 240 ml     Physical Exam: Vital Signs Blood pressure (!) 141/98, pulse 72, temperature 97.6 F (36.4 C), temperature source Oral, resp. rate 18, height 5\' 7"  (1.702 m), weight 85.5 kg, SpO2 99 %.  General: No acute distress Mood and affect are appropriate Heart: Regular rate and rhythm no rubs murmurs or extra sounds Lungs: Clear to auscultation, breathing unlabored, no rales or wheezes Abdomen: Positive bowel sounds, soft nontender to palpation, nondistended Extremities: No clubbing, cyanosis, or edema Skin: No evidence of breakdown, no evidence of rash Neurologic:  Motor: Grossly 4+/5 throughout RUE and RLE. LUE and LLE 4 to 4+/5. Fair [place]. Good standing balance with walker.  Senses pain and light touch. Distractible    Assessment/Plan: 1. Functional deficits secondary to right posterior pontine infarct which require 3+ hours per day of interdisciplinary therapy in a comprehensive inpatient rehab setting.  Physiatrist is providing close team supervision and 24 hour management of active medical problems listed below.  Physiatrist and rehab team continue to assess barriers to discharge/monitor patient progress toward functional and medical goals  Care Tool:  Bathing    Body parts bathed by patient: Right arm, Left arm, Chest, Abdomen, Front perineal area, Buttocks, Right upper leg, Left upper leg, Right lower leg, Left lower leg, Face          Bathing assist Assist Level: Supervision/Verbal cueing     Upper Body Dressing/Undressing Upper body dressing   What is the patient wearing?: Pull over shirt    Upper body assist Assist Level: Independent with assistive device    Lower Body Dressing/Undressing Lower body dressing      What is the patient wearing?: Pants, Underwear/pull up     Lower body assist Assist for lower body dressing: Supervision/Verbal cueing     Toileting Toileting Toileting Activity did not occur (Clothing management and hygiene only): N/A (no void or bm)  Toileting assist Assist for toileting: Supervision/Verbal cueing     Transfers Chair/bed transfer  Transfers assist     Chair/bed transfer assist level: Supervision/Verbal cueing     Locomotion Ambulation   Ambulation assist   Ambulation activity did not occur: Safety/medical concerns(orthostatic)  Assist level: Supervision/Verbal cueing Assistive device: Walker-rolling Max distance: 150+ft   Walk 10 feet activity   Assist  Walk 10 feet activity did not occur: Safety/medical concerns(orthostatic)  Assist level: Supervision/Verbal cueing Assistive device: Walker-rolling   Walk 50 feet activity   Assist Walk 50 feet with 2 turns activity did not occur: Safety/medical concerns(orthostatic)  Assist level: Supervision/Verbal cueing Assistive device: Walker-rolling    Walk 150 feet activity   Assist Walk 150 feet activity did not occur: Safety/medical concerns(orthostatic)  Assist level: Supervision/Verbal cueing Assistive device: Walker-rolling    Walk 10 feet on uneven surface  activity   Assist Walk 10 feet on uneven surfaces activity did not occur: Safety/medical concerns(orthostatic)   Assist level: Supervision/Verbal cueing  Wheelchair     Assist Will patient use wheelchair at discharge?: No   Wheelchair activity did not occur: N/A  Wheelchair assist level: Moderate Assistance -  Patient 50 - 74% Max wheelchair distance: 84ft    Wheelchair 50 feet with 2 turns activity    Assist    Wheelchair 50 feet with 2 turns activity did not occur: N/A       Wheelchair 150 feet activity     Assist  Wheelchair 150 feet activity did not occur: N/A       Blood pressure (!) 141/98, pulse 72, temperature 97.6 F (36.4 C), temperature source Oral, resp. rate 18, height 5\' 7"  (1.702 m), weight 85.5 kg, SpO2 99 %.  Medical Problem List and Plan: 1.  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    SNF pending. Pt has been told many times, STM/awareness deficits 2.  Antithrombotics: -DVT/anticoagulation:  Pharmaceutical: Lovenox 3/14- will con't until d/c- since pt should be walking well enough             -antiplatelet therapy: ASA 3. Persistent HA/Pain Management: Will change oxycodone to ultram prn. Add topamax to treat HA.             -3/15 Headache controlled with topamax 50mg  bid 4. Mood: LCSW to follow for evaluation and support.              -antipsychotic agents: N/A. Appreciate neuropsych evaluation.  5. Neuropsych: This patient is not capable of making decisions on her own behalf. 6. Skin/Wound Care: Routine pressure relief measures.  7. Fluids/Electrolytes/Nutrition: encourage po 8. HTN: Monitor BP   HCTZ, amlodipine DC'd  Norvasc, coreg bid, Bidil, lisinopril, Zestril. Has not taken medications for months. Will need to monitor for hypotension as medications get to steady state.             - Vitals:   10/23/19 0732 10/23/19 1003  BP: 122/87 (!) 141/98  Pulse: 78 72  Resp: 18   Temp:    SpO2: 99%   Some lability , no med changes for now , coreg is at low dose , HR ok this may need to be increased  9. OA bilateral knees: continue Voltaren gel to knees qid. Avoid narcotics due to ongoing polysubstance abuse.  10. AKI:    Improved Crat nl , BUN still elevated   3/19 recheck labs Monday- enc po fluids  11. Prediabetes: Hgb A1c- 6.2. Will monitor BS ac/hs and have RD educate on CM diet.         well controlled.  12. Nausea: scopolamine patch, prn compazine. Improved appetite better as well  13. Dizziness: scheduled meclizine 14. qTC prlonged on EKG today. Zofran d/ced.  15/ Insomnia  Improved with melatonin 6 mg QHS.       LOS: 17 days A FACE TO FACE EVALUATION WAS PERFORMED  Charlett Blake 10/23/2019, 10:33 AM

## 2019-10-24 ENCOUNTER — Inpatient Hospital Stay (HOSPITAL_COMMUNITY): Payer: Medicare HMO | Admitting: Physical Therapy

## 2019-10-24 ENCOUNTER — Inpatient Hospital Stay (HOSPITAL_COMMUNITY): Payer: Medicare HMO | Admitting: Occupational Therapy

## 2019-10-24 ENCOUNTER — Inpatient Hospital Stay (HOSPITAL_COMMUNITY): Payer: Medicare HMO | Admitting: Speech Pathology

## 2019-10-24 LAB — BASIC METABOLIC PANEL
Anion gap: 10 (ref 5–15)
BUN: 27 mg/dL — ABNORMAL HIGH (ref 8–23)
CO2: 23 mmol/L (ref 22–32)
Calcium: 9.2 mg/dL (ref 8.9–10.3)
Chloride: 108 mmol/L (ref 98–111)
Creatinine, Ser: 1.11 mg/dL — ABNORMAL HIGH (ref 0.44–1.00)
GFR calc Af Amer: 60 mL/min — ABNORMAL LOW (ref >60)
GFR calc non Af Amer: 51 mL/min — ABNORMAL LOW (ref >60)
Glucose, Bld: 107 mg/dL — ABNORMAL HIGH (ref 70–99)
Potassium: 4.3 mmol/L (ref 3.5–5.1)
Sodium: 141 mmol/L (ref 135–145)

## 2019-10-24 LAB — CBC
HCT: 35.3 % — ABNORMAL LOW (ref 36.0–46.0)
Hemoglobin: 11.1 g/dL — ABNORMAL LOW (ref 12.0–15.0)
MCH: 27.8 pg (ref 26.0–34.0)
MCHC: 31.4 g/dL (ref 30.0–36.0)
MCV: 88.3 fL (ref 80.0–100.0)
Platelets: 202 10*3/uL (ref 150–400)
RBC: 4 MIL/uL (ref 3.87–5.11)
RDW: 13.7 % (ref 11.5–15.5)
WBC: 5.4 10*3/uL (ref 4.0–10.5)
nRBC: 0.4 % — ABNORMAL HIGH (ref 0.0–0.2)

## 2019-10-24 LAB — GLUCOSE, CAPILLARY
Glucose-Capillary: 115 mg/dL — ABNORMAL HIGH (ref 70–99)
Glucose-Capillary: 96 mg/dL (ref 70–99)
Glucose-Capillary: 121 mg/dL — ABNORMAL HIGH (ref 70–99)
Glucose-Capillary: 129 mg/dL — ABNORMAL HIGH (ref 70–99)

## 2019-10-24 MED ORDER — LISINOPRIL 5 MG PO TABS
5.0000 mg | ORAL_TABLET | Freq: Every day | ORAL | Status: DC
  Administered 2019-10-24 – 2019-10-27 (×4): 5 mg via ORAL
  Filled 2019-10-24 (×4): qty 1

## 2019-10-24 NOTE — Progress Notes (Signed)
Speech Language Pathology Daily Session Note  Patient Details  Name: Alison Whitehead MRN: [id number] Date of Birth: Jul 06, 1951  Today's Date: 10/24/2019 SLP Individual Time: 0900-0930 SLP Individual Time Calculation (min): 30 min  Short Term Goals: Week 3: SLP Short Term Goal 1 (Week 3): STGs=LTGs due to ELOS  Skilled Therapeutic Interventions: Skilled treatment session focused on cognitive goals. SLP facilitated session by providing supervision for safety while patient utilized the RW to locate specific places on the unit from memory. Patient lindependently navigated the unite to locate all of the specific places on the scavenger hunt and apppropriately requested rest breaks when needed.  Patient left upright in recliner with alarm on and all needs within reach. Continue with current plan of care.      Pain Pain Assessment Pain Scale: 0-10 Pain Score: 4  Faces Pain Scale: Hurts little more  Therapy/Group: Individual Therapy  Kayson Tasker 10/24/2019, 2:55 PM

## 2019-10-24 NOTE — Progress Notes (Signed)
[place] PHYSICAL MEDICINE & REHABILITATION PROGRESS NOTE   Subjective/Complaints:  Up going to bathroom. No new complaints. Denies pain. Asked when she's leaving  ROS: Patient denies fever, rash, sore throat, blurred vision, nausea, vomiting, diarrhea, cough, shortness of breath or chest pain, joint or back pain, headache, or mood change.     Objective:   No results found. Recent Labs    10/24/19 0606  WBC 5.4  HGB 11.1*  HCT 35.3*  PLT 202   Recent Labs    10/24/19 0606  NA 141  K 4.3  CL 108  CO2 23  GLUCOSE 107*  BUN 27*  CREATININE 1.11*  CALCIUM 9.2    Intake/Output Summary (Last 24 hours) at 10/24/2019 1055 Last data filed at 10/24/2019 [id number] Gross per 24 hour  Intake 598 ml  Output --  Net 598 ml     Physical Exam: Vital Signs Blood pressure (!) 133/98, pulse 86, temperature 97.8 F (36.6 C), temperature source Oral, resp. rate 18, height 5\' 7"  (1.702 m), weight 85.5 kg, SpO2 100 %.  Constitutional: No distress . Vital signs reviewed. HEENT: EOMI, oral membranes moist Neck: supple Cardiovascular: RRR without murmur. No JVD    Respiratory/Chest: CTA Bilaterally without wheezes or rales. Normal effort    GI/Abdomen: BS +, non-tender, non-distended Ext: no clubbing, cyanosis, or edema Psych: pleasant and cooperative, impulsive Skin: No evidence of breakdown, no evidence of rash Neurologic:  Motor: Grossly 4+/5 throughout RUE and RLE. LUE and LLE 4 to 4+/5. Fair [place]. Good walkking balance with walker   Assessment/Plan: 1. Functional deficits secondary to right posterior pontine infarct which require 3+ hours per day of interdisciplinary therapy in a comprehensive inpatient rehab setting.  Physiatrist is providing close team supervision and 24 hour management of active medical problems listed below.  Physiatrist and rehab team continue to assess barriers to discharge/monitor patient progress toward functional and medical goals  Care  Tool:  Bathing    Body parts bathed by patient: Right arm, Left arm, Chest, Abdomen, Front perineal area, Buttocks, Right upper leg, Left upper leg, Right lower leg, Left lower leg, Face         Bathing assist Assist Level: Supervision/Verbal cueing     Upper Body Dressing/Undressing Upper body dressing   What is the patient wearing?: Pull over shirt    Upper body assist Assist Level: Independent with assistive device    Lower Body Dressing/Undressing Lower body dressing      What is the patient wearing?: Pants, Underwear/pull up     Lower body assist Assist for lower body dressing: Supervision/Verbal cueing     Toileting Toileting Toileting Activity did not occur (Clothing management and hygiene only): N/A (no void or bm)  Toileting assist Assist for toileting: Supervision/Verbal cueing     Transfers Chair/bed transfer  Transfers assist     Chair/bed transfer assist level: Supervision/Verbal cueing     Locomotion Ambulation   Ambulation assist   Ambulation activity did not occur: Safety/medical concerns(orthostatic)  Assist level: Supervision/Verbal cueing Assistive device: Walker-rolling Max distance: 150+ft   Walk 10 feet activity   Assist  Walk 10 feet activity did not occur: Safety/medical concerns(orthostatic)  Assist level: Supervision/Verbal cueing Assistive device: Walker-rolling   Walk 50 feet activity   Assist Walk 50 feet with 2 turns activity did not occur: Safety/medical concerns(orthostatic)  Assist level: Supervision/Verbal cueing Assistive device: Walker-rolling    Walk 150 feet activity   Assist Walk 150 feet activity did not occur: Safety/medical  concerns(orthostatic)  Assist level: Supervision/Verbal cueing Assistive device: Walker-rolling    Walk 10 feet on uneven surface  activity   Assist Walk 10 feet on uneven surfaces activity did not occur: Safety/medical concerns(orthostatic)   Assist level:  Supervision/Verbal cueing     Wheelchair     Assist Will patient use wheelchair at discharge?: No   Wheelchair activity did not occur: N/A  Wheelchair assist level: Moderate Assistance - Patient 50 - 74% Max wheelchair distance: 37ft    Wheelchair 50 feet with 2 turns activity    Assist    Wheelchair 50 feet with 2 turns activity did not occur: N/A       Wheelchair 150 feet activity     Assist  Wheelchair 150 feet activity did not occur: N/A       Blood pressure (!) 133/98, pulse 86, temperature 97.8 F (36.6 C), temperature source Oral, resp. rate 18, height 5\' 7"  (1.702 m), weight 85.5 kg, SpO2 100 %.  Medical Problem List and Plan: 1.  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    SNF pending.   2.  Antithrombotics: -DVT/anticoagulation:  Pharmaceutical: Lovenox 3/14- will con't until d/c- since pt should be walking well enough             -antiplatelet therapy: ASA 3. Persistent HA/Pain Management: Will change oxycodone to ultram prn. Add topamax to treat HA.             -3/15 Headache controlled with topamax 50mg  bid 4. Mood: LCSW to follow for evaluation and support.              -antipsychotic agents: N/A. Appreciate neuropsych evaluation.  5. Neuropsych: This patient is not capable of making decisions on her own behalf. 6. Skin/Wound Care: Routine pressure relief measures.  7. Fluids/Electrolytes/Nutrition: encourage po 8. HTN: Monitor BP   HCTZ, amlodipine DC'd  Norvasc, coreg bid, Bidil, lisinopril, Zestril. Has not taken medications for months. Will need to monitor for hypotension as medications get to steady state.             - Vitals:   10/23/19 2109 10/24/19 0434  BP: (!) 156/90 (!) 133/98  Pulse: 79 86  Resp: 20 18  Temp: 98 F (36.7 C) 97.8 F (36.6 C)  SpO2: 94% 100%    -resume lisinopril 5mg  daily 9.  OA bilateral knees: continue Voltaren gel to knees qid. Avoid narcotics due to ongoing polysubstance abuse.  10. AKI:   Improved Crat nl , BUN still elevated   Labs a little better today, BUN decreased  -continue to encourage fluids  11. Prediabetes: Hgb A1c- 6.2. Will monitor BS ac/hs and have RD educate on CM diet.         well controlled.  12. Nausea: scopolamine patch, prn compazine. Improved appetite better as well, intermittent symptoms still however 13. Dizziness: scheduled meclizine 14. qTC prlonged on EKG today. Zofran d/ced.  15/ Insomnia  Improved with melatonin 6 mg QHS.       LOS: 18 days A FACE TO FACE EVALUATION WAS PERFORMED  Meredith Staggers 10/24/2019, 10:55 AM

## 2019-10-24 NOTE — Progress Notes (Signed)
Social Work Patient ID: Alison Whitehead, female   DOB: Jan 18, 1951, 69 y.o.   MRN: [id number]    SW left message for [facility]/Admissions Director with [facility] [phone]) to follow-up about if able to accept pt, and if there were any updates from [place]. SW waiting on follow-up.     Loralee Pacas, MSW, [place] Office: [phone] Cell: [phone] Fax: [phone]

## 2019-10-24 NOTE — Progress Notes (Signed)
Physical Therapy Session Note  Patient Details  Name: [name] MRN: [id number] Date of Birth: 03-10-1951  Today's Date: 10/24/2019 PT Individual Time: 1015-1045 PT Individual Time Calculation (min): 30 min   Short Term Goals: Week 3:  PT Short Term Goal 1 (Week 3): =LTGs due to ELOS  Skilled Therapeutic Interventions/Progress Updates: Pt presented in recliner agreeable to therapy. Pt requesting to use bathroom, performed ambulatory transfer ti bathroom and toilet transfers with supervision (+void). Pt then ambulated to sink to and performed hand hygiene in standing. Pt ambulated to day room with RW and CGA with intermittent close 'S. Pt participated in several rounds of corn hole no AD for standing tolerance and dynamic balance. Pt also participated in one round while standing on [place] without AD requiring CGA however no significant LOB. Pt ambulated back to room at end of session and returned to recliner. Pt left with chair alarm on, call bell within reach and needs met.      Therapy Documentation Precautions:  Precautions Precautions: Fall, Other (comment) Precaution Comments: watch BP (MD parameters <140/80 and was orthostatic at eval); can be impulsive Restrictions Weight Bearing Restrictions: No General:   Vital Signs: Therapy Vitals Temp: 98.5 F (36.9 C) Pulse Rate: 77 Resp: 20 BP: 115/87 Patient Position (if appropriate): Sitting Oxygen Therapy SpO2: 97 % O2 Device: Room Air Pain: Pain Assessment Pain Score: 4     Therapy/Group: Individual Therapy  Janaya Broy  Berthe Oley, PTA  10/24/2019, 4:39 PM

## 2019-10-24 NOTE — Progress Notes (Signed)
Occupational Therapy Weekly Progress Note  Patient Details  Name: Alison Whitehead MRN: [id number] Date of Birth: February 19, 1951  Beginning of progress report period: October 14, 2019 End of progress report period: October 24, 2019  Today's Date: 10/24/2019 OT Individual Time: 2725-3664 OT Individual Time Calculation (min): 15 min    Patient has met  7 of 9 long term goals secondary to increased strength, increased activity tolerance, and increased safety awareness although she continues to require Min cueing for safety with RW during functional transfers and ambulation. Patient currently functioning at supervision A to [place] at most for self-care tasks including bathing in sitting/standing, grooming standing at sink level, and UB/LB dressing in standing/sitting. Short term goals not set due to estimated length of stay.    Patient continues to demonstrate the following deficits: muscle weakness, decreased cardiorespiratoy endurance, decreased safety awareness and decreased memory and decreased standing balance, decreased postural control indicating need for continued  skilled OT intervention to enhance overall performance with BADL.  See Patient's Care Plan for progression toward long term goals.  Patient progressing toward long term goals..  Continue plan of care.  Skilled Therapeutic Interventions/Progress Updates:  Patient met seated in recliner in agreement with skilled OT treatment session. C/o headache 6/10 at rest. BP assessed in sitting with reading of 139/98. Sit to stand with use of BUE on armrests without use of DME and supervision A with patient c/o increased dizziness. Assessment of BP in standing with reading of 137/101. RN notified. Patient repositioned in recliner with BLE elevated, call bell within reach, and chair alarm activated. Patient missed 15 minutes of scheduled OT session secondary to elevated BP.   Therapy Documentation Precautions:  Precautions Precautions: Fall, Other  (comment) Precaution Comments: watch BP (MD parameters <140/80 and was orthostatic at eval); can be impulsive Restrictions Weight Bearing Restrictions: No General: General OT Amount of Missed Time: 15 Minutes Vital Signs: BP: Seated: Standing:  Pain: Patient c/o headache 6/10. RN notified.  ADL: ADL Eating: Set up Grooming: Minimal assistance(standing) Upper Body Bathing: Supervision/safety Where Assessed-Upper Body Bathing: Sitting at sink, Wheelchair Lower Body Bathing: Minimal assistance Where Assessed-Lower Body Bathing: Wheelchair, Sitting at sink Upper Body Dressing: Supervision/safety Where Assessed-Upper Body Dressing: Sitting at sink, Wheelchair Lower Body Dressing: Minimal assistance Where Assessed-Lower Body Dressing: Sitting at sink, Standing at sink, Wheelchair Toileting: Minimal assistance Where Assessed-Toileting: Bedside Commode Toilet Transfer: Minimal assistance Toilet Transfer Method: Insurance claims handler Equipment: Bedside commode(over toileti wiht RW)   Therapy/Group: Individual Therapy  Alohilani Levenhagen R [place]Davis 10/24/2019, 11:55 AM

## 2019-10-25 ENCOUNTER — Inpatient Hospital Stay (HOSPITAL_COMMUNITY): Payer: Medicare HMO | Admitting: Occupational Therapy

## 2019-10-25 ENCOUNTER — Inpatient Hospital Stay (HOSPITAL_COMMUNITY): Payer: Medicare HMO | Admitting: Physical Therapy

## 2019-10-25 ENCOUNTER — Inpatient Hospital Stay (HOSPITAL_COMMUNITY): Payer: Medicare HMO | Admitting: Speech Pathology

## 2019-10-25 LAB — GLUCOSE, CAPILLARY
Glucose-Capillary: 107 mg/dL — ABNORMAL HIGH (ref 70–99)
Glucose-Capillary: 137 mg/dL — ABNORMAL HIGH (ref 70–99)
Glucose-Capillary: 107 mg/dL — ABNORMAL HIGH (ref 70–99)

## 2019-10-25 NOTE — Progress Notes (Signed)
Physical Therapy Session Note  Patient Details  Name: Alison Whitehead MRN: [id number] Date of Birth: [birth year]  Today's Date: 10/25/2019 PT Individual Time: 1110-1135 PT Individual Time Calculation (min): 25 min   Short Term Goals: Week 3:  PT Short Term Goal 1 (Week 3): =LTGs due to ELOS  Skilled Therapeutic Interventions/Progress Updates:   Pt received in supine, agreeable to therapy and no c/o pain. Independent bed mobility. Assisted pt w/ ordering lunch tray. Needed verbal cues as pt unable to attend to task of both ordering AND remembering what she and her roommate wanted for lunch. Ambulated to/from therapy gym w/ supervision using RW. No cues needed for safety, pt independently correcting her gait pattern by increasing upright posture. Instructed pt on curb negotiation w/ RW for community mobility. Performed w/ supervision. Also practiced negotiating 4 steps w/ unilateral rail, performed w/ supervision as well. Moderate increase in work of breathing during activity today. Pt reports she has increased congestion from "allergies". Work of breathing resolves w/ rest. Returned to room and ended session in recliner, all needs in reach.   Therapy Documentation Precautions:  Precautions Precautions: Fall, Other (comment) Precaution Comments: watch BP (MD parameters <140/80 and was orthostatic at eval); can be impulsive Restrictions Weight Bearing Restrictions: No  Vital Signs:    Therapy/Group: Individual Therapy  Krystal Teachey Clent Demark 10/25/2019, 11:39 AM

## 2019-10-25 NOTE — Progress Notes (Signed)
Physical Therapy Session Note  Patient Details  Name: Alison Whitehead MRN: [id number] Date of Birth: 02/11/1951  Today's Date: 10/25/2019 PT Individual Time: 0850-0917 PT Individual Time Calculation (min): 27 min   Short Term Goals: Week 3:  PT Short Term Goal 1 (Week 3): =LTGs due to ELOS  Skilled Therapeutic Interventions/Progress Updates:  Patient received in bed, eager to mobilize with PT. Now independent with bed mobility, only needed S and VC for safety throughout the rest of the session with RW. Tolerated ambulation to and from day room with RW and S, then rode Nustep for 8 minutes with B UE/LE (with first 4 minutes on level 6, reduced to level 4 due to increasing L UE OA pain with ongoing activity) for improved strength, coordination, and activity tolerance. After short rest break, walked back to her room with S and cues for safety and navigation in the environment. She was left in bed with all needs met, bed alarm active this morning.   Therapy Documentation Precautions:  Precautions Precautions: Fall, Other (comment) Precaution Comments: watch BP (MD parameters <140/80 and was orthostatic at eval); can be impulsive Restrictions Weight Bearing Restrictions: No Pain: Pain Assessment Pain Scale: Faces Faces Pain Scale: Hurts a little bit Pain Type: Chronic pain Pain Location: Arm Pain Orientation: Left Pain Descriptors / Indicators: Discomfort Pain Onset: On-going Patients Stated Pain Goal: 0 Pain Intervention(s): Ambulation/increased activity Multiple Pain Sites: No    Therapy/Group: Individual Therapy   Windell Norfolk, DPT, PN1   Supplemental Physical Therapist [place]    Pager [phone] Acute Rehab Office [phone]    10/25/2019, 10:18 AM

## 2019-10-25 NOTE — Progress Notes (Signed)
[place] PHYSICAL MEDICINE & REHABILITATION PROGRESS NOTE   Subjective/Complaints:  No new complaints. Says that family is getting her house together.   ROS: Patient denies fever, rash, sore throat, blurred vision, nausea, vomiting, diarrhea, cough, shortness of breath or chest pain, joint or back pain, headache, or mood change.     Objective:   No results found. Recent Labs    10/24/19 0606  WBC 5.4  HGB 11.1*  HCT 35.3*  PLT 202   Recent Labs    10/24/19 0606  NA 141  K 4.3  CL 108  CO2 23  GLUCOSE 107*  BUN 27*  CREATININE 1.11*  CALCIUM 9.2    Intake/Output Summary (Last 24 hours) at 10/25/2019 1238 Last data filed at 10/24/2019 1900 Gross per 24 hour  Intake 637 ml  Output --  Net 637 ml     Physical Exam: Vital Signs Blood pressure 133/88, pulse 64, temperature 98 F (36.7 C), temperature source Oral, resp. rate 18, height 5\' 7"  (1.702 m), weight 85.5 kg, SpO2 95 %.  Constitutional: No distress . Vital signs reviewed. HEENT: EOMI, oral membranes moist Neck: supple Cardiovascular: RRR without murmur. No JVD    Respiratory/Chest: CTA Bilaterally without wheezes or rales. Normal effort    GI/Abdomen: BS +, non-tender, non-distended Ext: no clubbing, cyanosis, or edema Psych: pleasant and cooperative, remains a little impulsive Skin: No evidence of breakdown, no evidence of rash, NSL still in Neurologic:  Motor: Grossly 4+/5 throughout RUE and RLE. LUE and LLE 4 to 4+/5. Fair [place].     Assessment/Plan: 1. Functional deficits secondary to right posterior pontine infarct which require 3+ hours per day of interdisciplinary therapy in a comprehensive inpatient rehab setting.  Physiatrist is providing close team supervision and 24 hour management of active medical problems listed below.  Physiatrist and rehab team continue to assess barriers to discharge/monitor patient progress toward functional and medical goals  Care Tool:  Bathing    Body parts  bathed by patient: Right arm, Left arm, Chest, Abdomen, Front perineal area, Buttocks, Right upper leg, Left upper leg, Right lower leg, Left lower leg, Face         Bathing assist Assist Level: Supervision/Verbal cueing     Upper Body Dressing/Undressing Upper body dressing   What is the patient wearing?: Pull over shirt    Upper body assist Assist Level: Independent with assistive device    Lower Body Dressing/Undressing Lower body dressing      What is the patient wearing?: Pants, Underwear/pull up     Lower body assist Assist for lower body dressing: Supervision/Verbal cueing     Toileting Toileting Toileting Activity did not occur (Clothing management and hygiene only): N/A (no void or bm)  Toileting assist Assist for toileting: Supervision/Verbal cueing     Transfers Chair/bed transfer  Transfers assist     Chair/bed transfer assist level: Supervision/Verbal cueing     Locomotion Ambulation   Ambulation assist   Ambulation activity did not occur: Safety/medical concerns(orthostatic)  Assist level: Supervision/Verbal cueing Assistive device: Walker-rolling Max distance: 150'   Walk 10 feet activity   Assist  Walk 10 feet activity did not occur: Safety/medical concerns(orthostatic)  Assist level: Supervision/Verbal cueing Assistive device: Walker-rolling   Walk 50 feet activity   Assist Walk 50 feet with 2 turns activity did not occur: Safety/medical concerns(orthostatic)  Assist level: Supervision/Verbal cueing Assistive device: Walker-rolling    Walk 150 feet activity   Assist Walk 150 feet activity did not occur:  Safety/medical concerns(orthostatic)  Assist level: Supervision/Verbal cueing Assistive device: Walker-rolling    Walk 10 feet on uneven surface  activity   Assist Walk 10 feet on uneven surfaces activity did not occur: Safety/medical concerns(orthostatic)   Assist level: Supervision/Verbal cueing      Wheelchair     Assist Will patient use wheelchair at discharge?: No   Wheelchair activity did not occur: N/A  Wheelchair assist level: Moderate Assistance - Patient 50 - 74% Max wheelchair distance: 47ft    Wheelchair 50 feet with 2 turns activity    Assist    Wheelchair 50 feet with 2 turns activity did not occur: N/A       Wheelchair 150 feet activity     Assist  Wheelchair 150 feet activity did not occur: N/A       Blood pressure 133/88, pulse 64, temperature 98 F (36.7 C), temperature source Oral, resp. rate 18, height 5\' 7"  (1.702 m), weight 85.5 kg, SpO2 95 %.  Medical Problem List and Plan: 1.  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    SNF pending due to safety concerns, impulsivity  2.  Antithrombotics: -DVT/anticoagulation:  Pharmaceutical: Lovenox  -can stop lovenox             -antiplatelet therapy: ASA 3. Persistent HA/Pain Management: Will change oxycodone to ultram prn. Add topamax to treat HA.             -3/15 Headache controlled with topamax 50mg  bid 4. Mood: LCSW to follow for evaluation and support.              -antipsychotic agents: N/A. Appreciate neuropsych evaluation.  5. Neuropsych: This patient is not capable of making decisions on her own behalf. 6. Skin/Wound Care: Routine pressure relief measures.  7. Fluids/Electrolytes/Nutrition: encourage po 8. HTN: Monitor BP   HCTZ stopped  Norvasc, coreg bid, Bidil, lisinopril, Zestril at home but not using             -recent hypotension d/t volume meds, slowly resuming meds as BP climbing again Vitals:   10/24/19 2300 10/25/19 0613  BP:  133/88  Pulse:  64  Resp:  18  Temp:  98 F (36.7 C)  SpO2: 96% 95%    Continue coreg 6.25 bid and norvasc 10mg  daily  3/22-resumed lisinopril 5mg  daily  3/23 bp's under fair control. No changes 9. OA bilateral  knees: continue Voltaren gel to knees qid. Avoid narcotics due to ongoing polysubstance abuse.  10. AKI:   Improved Crat nl , BUN still elevated   3/22 Labs a little better, BUN decreased   -continue to encourage fluids  11. Prediabetes: Hgb A1c- 6.2. Will monitor BS ac/hs and have RD educate on CM diet.         well controlled.  12. Nausea: scopolamine patch, prn compazine. Improved appetite better as well, intermittent symptoms still however 13. Dizziness: scheduled meclizine 14. qTC prlonged on EKG today. Zofran d/ced.  15/ Insomnia  Improved with melatonin 6 mg QHS.       LOS: 19 days A FACE TO FACE EVALUATION WAS PERFORMED  Meredith Staggers 10/25/2019, 12:38 PM

## 2019-10-25 NOTE — Patient Care Conference (Signed)
Inpatient RehabilitationTeam Conference and Plan of Care Update Date: 10/25/2019   Time: 10:15 AM    Patient Name: Alison Whitehead      Medical Record Number: [id number]  Date of Birth: [birth year] Sex: Female         Room/Bed: 4W16C/4W16C-02 Payor Info: Payor: [employer] MEDICARE / Plan: [place] [facility] MEDICARE HMO/PPO / Product Type: *No Product type* /    Admit Date/Time:  10/06/2019  1:58 PM  Primary Diagnosis:  Right pontine stroke [facility])  Patient Active Problem List   Diagnosis Date Noted  . Small vessel disease, cerebrovascular   . Prediabetes   . AKI (acute kidney injury) ([place])   . Orthostatic hypotension   . Right pontine stroke ([place]) 10/06/2019  . Acute cerebrovascular accident (CVA) ([place]) 09/30/2019  . Unilateral primary osteoarthritis, left knee 03/17/2019  . Unilateral primary osteoarthritis, right knee 03/17/2019  . Bilateral primary osteoarthritis of knee 03/10/2019  . INSOMNIA, CHRONIC 05/19/2007  . SPONDYLOSIS, LUMBAR 05/19/2007  . Essential hypertension 05/03/2007  . ARTHRITIS, KNEE 05/03/2007    Expected Discharge Date: Expected Discharge Date: (Current plan is for SNF placement.)  Team Members Present: Physician leading conference: Dr. Alger Simons Care Coodinator Present: Loralee Pacas, LCSWA;Genie Fleet Higham, RN, MSN Nurse Present: Ellison Carwin, LPN PT Present: Burnard Bunting, PT OT Present: Turner Daniels, OT SLP Present: Weston Anna, SLP PPS Coordinator present : Ileana Ladd, Burna Mortimer, SLP     Current Status/Progress Goal Weekly Team Focus  Bowel/Bladder   cont of bowel and bladder  remain cont of bowel and bladder  assess q shift and prn   Swallow/Nutrition/ Hydration             ADL's   S overall, memory and safety awareness continued to be impaired; QD schedule; elevated BP over weeked  S overall  safety awareness, education on memory strategies incorporated in BADL   Mobility   supervision overall w/ RW, >150', continues to need  cues for safety and continues to have poor awareness of deficits  supervision gait, mod/i transfers  discharge planning, safety awareness, functional memory and dual task, endurance and global strength   Communication             Safety/Cognition/ Behavioral Observations  Supervision-Min A  Min A  complex problem solving, recall with use of strategies   Pain   p  to eliminate headaches and keep pain less then 3  assess pain qshift and prn and utilize prn pain medication   Skin   no skin issues  remain free of skin breakdown and infection  assess skin qshift and prn    Rehab Goals Patient on target to meet rehab goals: Yes Rehab Goals Revised: D/c tp SNF *See Care Plan and progress notes for long and short-term goals.     Barriers to Discharge  Current Status/Progress Possible Resolutions Date Resolved   Nursing                  PT                    OT                  SLP                SW Decreased caregiver support;Lack of/limited family support;Insurance for SNF coverage Pt family states unable to provide 24/7 care reason for SNF referral. Most insurance will not accept pt as not in insurance network or no bed availability.  Discharge Planning/Teaching Needs:  D/C to SNF. SW waiting on follow-up from [facility] as they report waiting on follow up from insurance. Family aware if pt is not approved for SNF by insurance, they will have to take pt home and provider 24/7 care.  Family education as recommended by therapy   Team Discussion: MD monitoring BP, meds adjusted.  RN BM today, BS 107, cont B/B, nose spray given.  OT S/CGA, needs VC's at times.  PT S overall, occ VC's for safety, transfer goals mod I, community gait goals S.  SLP memor and prob solving improved, Dtr to help, would need intermittent S if going home.   Revisions to Treatment Plan: N/A     Medical Summary Current Status: continent of b/b. bp's increasing after holding meds last  week. still impulsive Weekly Focus/Goal: stabilizing med mgt for discharge         Continued Need for Acute Rehabilitation Level of Care: The patient requires daily medical management by a physician with specialized training in physical medicine and rehabilitation for the following reasons: Direction of a multidisciplinary physical rehabilitation program to maximize functional independence : Yes Medical management of patient stability for increased activity during participation in an intensive rehabilitation regime.: Yes Analysis of laboratory values and/or radiology reports with any subsequent need for medication adjustment and/or medical intervention. : Yes   I attest that I was present, lead the team conference, and concur with the assessment and plan of the team.   Jodell Cipro M 10/25/2019, 2:29 PM   Team conference was held via web/ teleconference due to [place] - 19

## 2019-10-25 NOTE — Progress Notes (Signed)
Occupational Therapy Session Note  Patient Details  Name: Alison Whitehead MRN: [id number] Date of Birth: 1951/04/28  Today's Date: 10/25/2019 OT Individual Time: 1150-1220 OT Individual Time Calculation (min): 30 min    Short Term Goals: Week 1:  OT Short Term Goal 1 (Week 1): Pt will transfer to toilet wiht CGA OT Short Term Goal 2 (Week 1): Pt will groomin in standing wiht S OT Short Term Goal 3 (Week 1): Pt will complete toileting with CGA  Skilled Therapeutic Interventions/Progress Updates:  Patient met seated in recliner in agreement with OT treatment session. Ambulation to dayroom with RW and supervision A. Dynamic standing balance without use of DME and supervision A with reaching/bending outside of base of support. Patient continues to demonstrate increased safety awareness this date alerting OT of items dropped on floor. With close supervision, patient able to bend to retrieve items from floor supporting LUE on RW. Patient verbally expressed knowledge of her deficits and strategies for maintaining safety at d/c. Patient ambulated back to room with use of RW and supervision A. Patient left seated in recliner with belt alarm activated, call bell within reach and all needs met.   Therapy Documentation Precautions:  Precautions Precautions: Fall, Other (comment) Precaution Comments: watch BP (MD parameters <140/80 and was orthostatic at eval); can be impulsive Restrictions Weight Bearing Restrictions: No   Therapy/Group: Individual Therapy  Meleane Selinger R [place]-Davis 10/25/2019, 12:52 PM

## 2019-10-25 NOTE — Progress Notes (Signed)
Speech Language Pathology Daily Session Note  Patient Details  Name: Zuriyah Atz MRN: [id number] Date of Birth: [birth year]  Today's Date: 10/25/2019 SLP Individual Time: [id number] SLP Individual Time Calculation (min): 30 min  Short Term Goals: Week 3: SLP Short Term Goal 1 (Week 3): STGs=LTGs due to ELOS  Skilled Therapeutic Interventions: Skilled treatment session focused on cognitive goals. SLP facilitated session by administering the Cognistat. Patient scored WFL on all subtests with the exceptio of mild impairments in short-term memory. Patient continues to report baseline memory deficits and feels that her memory is at baseline, however, no family present to confirm. Patient ambulated to and from the SLP office with the RW with overall supervision level verbal cues. Patient let upright in recliner with alarm on and all needs within reach. Continue with current plan of care.      Pain No/Denies Pain   Therapy/Group: Individual Therapy  Lyndsay Talamante 10/25/2019, 2:31 PM

## 2019-10-25 NOTE — Progress Notes (Signed)
Social Work Patient ID: Alison Whitehead, female   DOB: 1950/12/28, 69 y.o.   MRN: [id number]   SW waiting on follow-up with Sheila/Admissions at [facility] as she reports waiting on updates from [place]. SW waiting on follow-up.   Loralee Pacas, MSW, [place] Office: [phone] Cell: [phone] Fax: [phone]

## 2019-10-26 ENCOUNTER — Inpatient Hospital Stay (HOSPITAL_COMMUNITY): Payer: Medicare HMO | Admitting: Occupational Therapy

## 2019-10-26 ENCOUNTER — Inpatient Hospital Stay (HOSPITAL_COMMUNITY): Payer: Medicare HMO | Admitting: Speech Pathology

## 2019-10-26 ENCOUNTER — Inpatient Hospital Stay (HOSPITAL_COMMUNITY): Payer: Medicare HMO | Admitting: Physical Therapy

## 2019-10-26 LAB — GLUCOSE, CAPILLARY
Glucose-Capillary: 86 mg/dL (ref 70–99)
Glucose-Capillary: 125 mg/dL — ABNORMAL HIGH (ref 70–99)
Glucose-Capillary: 94 mg/dL (ref 70–99)
Glucose-Capillary: 129 mg/dL — ABNORMAL HIGH (ref 70–99)

## 2019-10-26 NOTE — Plan of Care (Signed)
  Problem: Consults Goal: RH STROKE PATIENT EDUCATION Description: See Patient Education module for education specifics  Outcome: Progressing Goal: Nutrition Consult-if indicated Outcome: Progressing Goal: Diabetes Guidelines if Diabetic/Glucose > 140 Description: If diabetic or lab glucose is > 140 mg/dl - Initiate Diabetes/Hyperglycemia Guidelines & Document Interventions  Outcome: Progressing   Problem: RH BOWEL ELIMINATION Goal: RH STG MANAGE BOWEL WITH ASSISTANCE Description: STG Manage Bowel with  moderate Assistance. Outcome: Progressing Goal: RH STG MANAGE BOWEL W/MEDICATION W/ASSISTANCE Description: STG Manage Bowel with Medication with moderate Assistance. Outcome: Progressing   Problem: RH SKIN INTEGRITY Goal: RH STG SKIN FREE OF INFECTION/BREAKDOWN Description: Skin free from infection entire stay on rehab Outcome: Progressing Goal: RH STG ABLE TO PERFORM INCISION/WOUND CARE W/ASSISTANCE Description: STG Able To Perform Incision/Wound Care With Assistance. Outcome: Progressing   Problem: RH SAFETY Goal: RH STG ADHERE TO SAFETY PRECAUTIONS W/ASSISTANCE/DEVICE Description: STG Adhere to Safety Precautions With  moderate Assistance/Device. Outcome: Progressing   Problem: RH PAIN MANAGEMENT Goal: RH STG PAIN MANAGED AT OR BELOW PT'S PAIN GOAL Description: Pain less than 2 Outcome: Progressing   Problem: RH KNOWLEDGE DEFICIT Goal: RH STG INCREASE KNOWLEDGE OF HYPERTENSION Description: Moderate assistance Outcome: Progressing

## 2019-10-26 NOTE — Plan of Care (Signed)
  Problem: Consults Goal: RH STROKE PATIENT EDUCATION Description: See Patient Education module for education specifics  Outcome: Progressing Goal: Nutrition Consult-if indicated Outcome: Progressing   Problem: RH BOWEL ELIMINATION Goal: RH STG MANAGE BOWEL WITH ASSISTANCE Description: STG Manage Bowel with  moderate Assistance. Outcome: Progressing Goal: RH STG MANAGE BOWEL W/MEDICATION W/ASSISTANCE Description: STG Manage Bowel with Medication with moderate Assistance. Outcome: Progressing   Problem: RH SKIN INTEGRITY Goal: RH STG SKIN FREE OF INFECTION/BREAKDOWN Description: Skin free from infection entire stay on rehab Outcome: Progressing

## 2019-10-26 NOTE — Progress Notes (Signed)
Physical Therapy Session Note  Patient Details  Name: Alison Whitehead MRN: [id number] Date of Birth: 12-17-1950  Today's Date: 10/26/2019 PT Individual Time: 0807-0827 PT Individual Time Calculation (min): 20 min   Short Term Goals: Week 3:  PT Short Term Goal 1 (Week 3): =LTGs due to ELOS  Skilled Therapeutic Interventions/Progress Updates:   Pt received sitting EOB and agreeable to therapy, no c/o pain. Ambulated to/from therapy gym w/ distant supervision using RW. Performed Berg Balance Scale, scored 41/56, improved from score of 35/56 approx 1 week ago. Pt w/ less dizziness w/ testing today vs last week, however continues to have dizziness w/ SLS and narrow BOS activities. Dizziness resolves w/ standing or seated rest break. Explained significance of results to pt including ongoing increased fall risk w/ score <45/56. Pt requesting to return to room as breakfast tray had arrived late. Ambulated back to room and ended session sitting EOB independently, all needs in reach. Missed 10 min of skilled PT 2/2 breakfast tray.   Therapy Documentation Precautions:  Precautions Precautions: Fall, Other (comment) Precaution Comments: watch BP (MD parameters <140/80 and was orthostatic at eval); can be impulsive Restrictions Weight Bearing Restrictions: No Pain: Pain Assessment Pain Scale: 0-10 Pain Score: 7  Faces Pain Scale: No hurt Pain Type: Acute pain Pain Location: Arm Pain Orientation: Right;Left Pain Radiating Towards: shoulder Patients Stated Pain Goal: 1 Pain Intervention(s): Medication (See eMAR)  Therapy/Group: Individual Therapy  Doreene Forrey Clent Demark 10/26/2019, 8:33 AM

## 2019-10-26 NOTE — Progress Notes (Signed)
Occupational Therapy Session Note  Patient Details  Name: Alison Whitehead MRN: [id number] Date of Birth: 09-04-1950  Today's Date: 10/26/2019 OT Individual Time: 1015-1045 OT Individual Time Calculation (min): 30 min    Short Term Goals: Week 3:  PT Short Term Goal 1 (Week 3): =LTGs due to ELOS  Skilled Therapeutic Interventions/Progress Updates:  Patient met lying supine in bed in agreement with OT treatment session with focus on self-care re-education. Patient able to complete supine to EOB transfer independently. Toilet transfer, toileting/hygiene/clothing management with supervision A. Patient with request for bathing. OT provided set-up assist with wash basin and towels. Patient able to complete UB bath with set-up and LB bath with supervision A. UB dressing with I and LB dressing with distant supervision A for safety. Patient left in long sitting in bed with call bell within reach, bed alarm activated and all needs met.   Therapy Documentation Precautions:  Precautions Precautions: Fall, Other (comment) Precaution Comments: watch BP (MD parameters <140/80 and was orthostatic at eval); can be impulsive Restrictions Weight Bearing Restrictions: No   Therapy/Group: Individual Therapy  Camielle Sizer R [place]-Davis 10/26/2019, 11:07 AM

## 2019-10-26 NOTE — Progress Notes (Signed)
Social Work Patient ID: Alison Whitehead, female   DOB: [birth year], 69 y.o.   MRN: [id number]    SW received phone call from [facility] Director with [employer] [phone]) to ask SW if there was another agency requesting authorization for pt. SW informed there should not be. States she will follow-up with [facility] and discuss authorization for pt. SW waiting on follow-up.   Loralee Pacas, MSW, [place] Office: [phone] Cell: [phone] Fax: [phone]

## 2019-10-26 NOTE — Progress Notes (Signed)
Speech Language Pathology Daily Session Note  Patient Details  Name: [name] MRN: [id number] Date of Birth: 12-Jun-1951  Today's Date: 10/26/2019 SLP Individual Time: 0730-0800 SLP Individual Time Calculation (min): 30 min  Short Term Goals: Week 3: SLP Short Term Goal 1 (Week 3): STGs=LTGs due to ELOS  Skilled Therapeutic Interventions:  Skilled treatment session targeted pt's cognition goals. SLP facilitated session by providing Maximal assistance to process change in pt's schedule. SLP wrote information down and then provided Maximal assistance to comprehend this information. SLP facilitated rehearsal of information and reading information. After 5 minute delay, pt required Mod A cues to effectively recall information. Pt given 2 items of information to remember and utilize information while performing task. Pt was Min A for accuracy. Pt required Max A verbal cues for redirection to task at 2 minute intervals d/t deficits in selective attention. Pt left sitting in bed with all needs within reach.      Pain    Therapy/Group: Individual Therapy  Graycen Sadlon 10/26/2019, 11:44 AM

## 2019-10-26 NOTE — Progress Notes (Signed)
[place] PHYSICAL MEDICINE & REHABILITATION PROGRESS NOTE   Subjective/Complaints: Feeling very well this morning. Sitting up in bed eating her breakfast with good appetite. Has not had nausea for a couple of days and is very happy her blood pressure is under good control.   ROS: Patient denies fever, rash, sore throat, blurred vision, nausea, vomiting, diarrhea, cough, shortness of breath or chest pain, joint or back pain, headache, or mood change.     Objective: No results found. Recent Labs    10/24/19 0606  WBC 5.4  HGB 11.1*  HCT 35.3*  PLT 202   Recent Labs    10/24/19 0606  NA 141  K 4.3  CL 108  CO2 23  GLUCOSE 107*  BUN 27*  CREATININE 1.11*  CALCIUM 9.2    Intake/Output Summary (Last 24 hours) at 10/26/2019 0916 Last data filed at 10/25/2019 1800 Gross per 24 hour  Intake 480 ml  Output 2 ml  Net 478 ml     Physical Exam: Vital Signs Blood pressure 134/83, pulse (!) 58, temperature 97.9 F (36.6 C), temperature source Oral, resp. rate 18, height 5\' 7"  (1.702 m), weight 85.5 kg, SpO2 95 %. Constitutional: No distress . Vital signs reviewed. Sitting up in bed eating breakfast with good appetite.  HEENT: EOMI, oral membranes moist Neck: supple Cardiovascular: RRR without murmur. No JVD    Respiratory/Chest: CTA Bilaterally without wheezes or rales. Normal effort    GI/Abdomen: BS +, non-tender, non-distended Ext: no clubbing, cyanosis, or edema Psych: pleasant and cooperative, remains a little impulsive Skin: No evidence of breakdown, no evidence of rash, NSL still in Neurologic:  Motor: Grossly 4+/5 throughout RUE and RLE. LUE and LLE 4 to 4+/5. [place].    Assessment/Plan: 1. Functional deficits secondary to right posterior pontine infarct which require 3+ hours per day of interdisciplinary therapy in a comprehensive inpatient rehab setting.  Physiatrist is providing close team supervision and 24 hour management of active medical problems listed  below.  Physiatrist and rehab team continue to assess barriers to discharge/monitor patient progress toward functional and medical goals  Care Tool:  Bathing    Body parts bathed by patient: Right arm, Left arm, Chest, Abdomen, Front perineal area, Buttocks, Right upper leg, Left upper leg, Right lower leg, Left lower leg, Face         Bathing assist Assist Level: Supervision/Verbal cueing     Upper Body Dressing/Undressing Upper body dressing   What is the patient wearing?: Pull over shirt    Upper body assist Assist Level: Independent with assistive device    Lower Body Dressing/Undressing Lower body dressing      What is the patient wearing?: Pants, Underwear/pull up     Lower body assist Assist for lower body dressing: Supervision/Verbal cueing     Toileting Toileting Toileting Activity did not occur (Clothing management and hygiene only): N/A (no void or bm)  Toileting assist Assist for toileting: Supervision/Verbal cueing     Transfers Chair/bed transfer  Transfers assist     Chair/bed transfer assist level: Supervision/Verbal cueing     Locomotion Ambulation   Ambulation assist   Ambulation activity did not occur: Safety/medical concerns(orthostatic)  Assist level: Supervision/Verbal cueing Assistive device: Walker-rolling Max distance: 150'   Walk 10 feet activity   Assist  Walk 10 feet activity did not occur: Safety/medical concerns(orthostatic)  Assist level: Supervision/Verbal cueing Assistive device: Walker-rolling   Walk 50 feet activity   Assist Walk 50 feet with 2 turns  activity did not occur: Safety/medical concerns(orthostatic)  Assist level: Supervision/Verbal cueing Assistive device: Walker-rolling    Walk 150 feet activity   Assist Walk 150 feet activity did not occur: Safety/medical concerns(orthostatic)  Assist level: Supervision/Verbal cueing Assistive device: Walker-rolling    Walk 10 feet on uneven surface   activity   Assist Walk 10 feet on uneven surfaces activity did not occur: Safety/medical concerns(orthostatic)   Assist level: Supervision/Verbal cueing     Wheelchair     Assist Will patient use wheelchair at discharge?: No   Wheelchair activity did not occur: N/A  Wheelchair assist level: Moderate Assistance - Patient 50 - 74% Max wheelchair distance: 53ft    Wheelchair 50 feet with 2 turns activity    Assist    Wheelchair 50 feet with 2 turns activity did not occur: N/A       Wheelchair 150 feet activity     Assist  Wheelchair 150 feet activity did not occur: N/A       Blood pressure 134/83, pulse (!) 58, temperature 97.9 F (36.6 C), temperature source Oral, resp. rate 18, height 5\' 7"  (1.702 m), weight 85.5 kg, SpO2 95 %.  Medical Problem List and Plan: 1.  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    SNF pending due to safety concerns, impulsivity  2.  Antithrombotics: -DVT/anticoagulation:  Pharmaceutical: Lovenox  -can stop lovenox             -antiplatelet therapy: ASA 3. Persistent HA/Pain Management: Will change oxycodone to ultram prn. Add topamax to treat HA.             -3/15 Headache controlled with topamax 50mg  bid. Pain is overall well controlled.  4. Mood: LCSW to follow for evaluation and support.              -antipsychotic agents: N/A. Appreciate neuropsych evaluation.  5. Neuropsych: This patient is not capable of making decisions on her own behalf. 6. Skin/Wound Care: Routine pressure relief measures.  7. Fluids/Electrolytes/Nutrition: encourage po 8. HTN: Monitor BP   HCTZ stopped  Norvasc, coreg bid, Bidil, lisinopril, Zestril at home but not using             -recent hypotension d/t volume meds, slowly resuming meds as BP climbing again Vitals:   10/25/19 1946 10/26/19 0405  BP: 120/81 134/83   Pulse: 88 (!) 58  Resp: 18 18  Temp: 98.6 F (37 C) 97.9 F (36.6 C)  SpO2: 98% 95%    Continue coreg 6.25 bid and norvasc 10mg  daily  3/22-resumed lisinopril 5mg  daily  3/24 bp's under fair control. No changes 9. OA bilateral knees: continue Voltaren gel to knees qid. Avoid narcotics due to ongoing polysubstance abuse.  10. AKI:   Improved Crat nl , BUN still elevated   3/22 Labs a little better, BUN decreased   -continue to encourage fluids  11. Prediabetes: Hgb A1c- 6.2. Will monitor BS ac/hs and have RD educate on CM diet.     well controlled.  12. Nausea: scopolamine patch, prn compazine. Improved appetite better as well, intermittent symptoms still however. Well controlled this morning.  13. Dizziness: scheduled meclizine 14. qTC prlonged on EKG today. Zofran d/ced.  15/ Insomnia  Improved with melatonin 6 mg QHS.       LOS: 20 days A FACE TO FACE EVALUATION WAS PERFORMED  Izora Ribas 10/26/2019,  9:16 AM

## 2019-10-27 ENCOUNTER — Inpatient Hospital Stay (HOSPITAL_COMMUNITY): Payer: Medicare HMO

## 2019-10-27 ENCOUNTER — Inpatient Hospital Stay (HOSPITAL_COMMUNITY): Payer: Medicare HMO | Admitting: Occupational Therapy

## 2019-10-27 ENCOUNTER — Inpatient Hospital Stay (HOSPITAL_COMMUNITY): Payer: Medicare HMO | Admitting: Speech Pathology

## 2019-10-27 LAB — GLUCOSE, CAPILLARY
Glucose-Capillary: 105 mg/dL — ABNORMAL HIGH (ref 70–99)
Glucose-Capillary: 112 mg/dL — ABNORMAL HIGH (ref 70–99)
Glucose-Capillary: 91 mg/dL (ref 70–99)
Glucose-Capillary: 131 mg/dL — ABNORMAL HIGH (ref 70–99)

## 2019-10-27 MED ORDER — LISINOPRIL 5 MG PO TABS
5.0000 mg | ORAL_TABLET | Freq: Once | ORAL | Status: AC
  Administered 2019-10-27: 5 mg via ORAL
  Filled 2019-10-27: qty 1

## 2019-10-27 MED ORDER — LISINOPRIL 10 MG PO TABS
10.0000 mg | ORAL_TABLET | Freq: Every day | ORAL | Status: DC
  Administered 2019-10-28 – 2019-10-29 (×2): 10 mg via ORAL
  Filled 2019-10-27 (×2): qty 1

## 2019-10-27 NOTE — Progress Notes (Signed)
Nutrition Follow-up  **RD working remotely**  DOCUMENTATION CODES:   Not applicable  INTERVENTION:   Please obtain updated weight.    ContinueEnsure Enlive po BID, each supplement provides 350 kcal and 20 grams of protein  Continue snacks BID  Continue MVI daily   NUTRITION DIAGNOSIS:   Increased nutrient needs related to other (see comment)(therapies) as evidenced by estimated needs.  Ongoing.  GOAL:   Patient will meet greater than or equal to 90% of their needs  Met.   MONITOR:   PO intake, Supplement acceptance, Weight trends, Labs  REASON FOR ASSESSMENT:   Malnutrition Screening Tool    ASSESSMENT:   Pt with a PMH of HTN, IBS, OA bilateral knees, cocaine abuse who was admitted on 10/01/19 with onset of weakness and dizziness followed by N/V. UDS positive for opiates and cocaine. 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  CT abdomen pelvis showed enlarged fibroid uterus with fairly extensive diverticulosis. Pt admitted to [facility] 10/06/19.  RD unable to reach pt via phone.  Noted SNF pending per MD.   PO Intake: 50-100% x last 8 recorded meals (85% average intake)  Medications reviewed and include: ensure Enlive BID, SSI, MVI, Miralax, Senokot-S  Labs reviewed: CBGs 94-129  Diet Order:   Diet Order            Diet heart healthy/carb modified Room service appropriate? Yes; Fluid consistency: Thin  Diet effective now              EDUCATION NEEDS:   No education needs have been identified at this time  Skin:  Skin Assessment: Reviewed RN Assessment  Last BM:  3/24  Height:   Ht Readings from Last 1 Encounters:  10/06/19 '5\' 7"'$  (1.702 m)    Weight:   Wt Readings from Last 1 Encounters:  10/13/19 85.5 kg    BMI:  Body mass index  is 29.52 kg/m.  Estimated Nutritional Needs:   Kcal:  1700-1900  Protein:  85-95 grams  Fluid:  >/= 1.7L/d   Larkin Ina, MS, RD, LDN RD pager number and weekend/on-call pager number located in [place].

## 2019-10-27 NOTE — Progress Notes (Signed)
[place] PHYSICAL MEDICINE & REHABILITATION PROGRESS NOTE   Subjective/Complaints:  Up in bed. Asked about her disposition. No new complaints.     Objective:   No results found. No results for input(s): WBC, HGB, HCT, PLT in the last 72 hours. No results for input(s): NA, K, CL, CO2, GLUCOSE, BUN, CREATININE, CALCIUM in the last 72 hours.  Intake/Output Summary (Last 24 hours) at 10/27/2019 1025 Last data filed at 10/27/2019 0828 Gross per 24 hour  Intake 600 ml  Output 2 ml  Net 598 ml     Physical Exam: Vital Signs Blood pressure (!) 140/93, pulse 64, temperature (!) 97.3 F (36.3 C), temperature source Oral, resp. rate 18, height 5\' 7"  (1.702 m), weight 85.5 kg, SpO2 100 %.  Constitutional: No distress . Vital signs reviewed. HEENT: EOMI, oral membranes moist Neck: supple Cardiovascular: RRR without murmur. No JVD    Respiratory/Chest: CTA Bilaterally without wheezes or rales. Normal effort    GI/Abdomen: BS +, non-tender, non-distended Ext: no clubbing, cyanosis, or edema Psych: very pleasant and cooperative Skin: No evidence of breakdown, no evidence of rash   Neurologic:  Motor: Grossly 4+/5 throughout RUE and RLE. LUE and LLE 4 to 4+/5. reasaonble fmc, still a little impulsive.     Assessment/Plan: 1. Functional deficits secondary to right posterior pontine infarct which require 3+ hours per day of interdisciplinary therapy in a comprehensive inpatient rehab setting.  Physiatrist is providing close team supervision and 24 hour management of active medical problems listed below.  Physiatrist and rehab team continue to assess barriers to discharge/monitor patient progress toward functional and medical goals  Care Tool:  Bathing    Body parts bathed by patient: Right arm, Left arm, Chest, Abdomen, Front perineal area, Buttocks, Right upper leg, Left upper leg, Right lower leg, Left lower leg, Face         Bathing assist Assist Level: Supervision/Verbal  cueing(In sitting/standing at EOB.)     Upper Body Dressing/Undressing Upper body dressing   What is the patient wearing?: Pull over shirt    Upper body assist Assist Level: Independent with assistive device    Lower Body Dressing/Undressing Lower body dressing      What is the patient wearing?: Pants, Underwear/pull up     Lower body assist Assist for lower body dressing: Supervision/Verbal cueing(Sitting/standing at EOB.)     Toileting Toileting Toileting Activity did not occur (Clothing management and hygiene only): N/A (no void or bm)  Toileting assist Assist for toileting: Supervision/Verbal cueing     Transfers Chair/bed transfer  Transfers assist     Chair/bed transfer assist level: Supervision/Verbal cueing     Locomotion Ambulation   Ambulation assist   Ambulation activity did not occur: Safety/medical concerns(orthostatic)  Assist level: Supervision/Verbal cueing Assistive device: Walker-rolling Max distance: 150'   Walk 10 feet activity   Assist  Walk 10 feet activity did not occur: Safety/medical concerns(orthostatic)  Assist level: Supervision/Verbal cueing Assistive device: Walker-rolling   Walk 50 feet activity   Assist Walk 50 feet with 2 turns activity did not occur: Safety/medical concerns(orthostatic)  Assist level: Supervision/Verbal cueing Assistive device: Walker-rolling    Walk 150 feet activity   Assist Walk 150 feet activity did not occur: Safety/medical concerns(orthostatic)  Assist level: Supervision/Verbal cueing Assistive device: Walker-rolling    Walk 10 feet on uneven surface  activity   Assist Walk 10 feet on uneven surfaces activity did not occur: Safety/medical concerns(orthostatic)   Assist level: Supervision/Verbal cueing  Wheelchair     Assist Will patient use wheelchair at discharge?: No   Wheelchair activity did not occur: N/A  Wheelchair assist level: Moderate Assistance - Patient  50 - 74% Max wheelchair distance: 85ft    Wheelchair 50 feet with 2 turns activity    Assist    Wheelchair 50 feet with 2 turns activity did not occur: N/A       Wheelchair 150 feet activity     Assist  Wheelchair 150 feet activity did not occur: N/A       Blood pressure (!) 140/93, pulse 64, temperature (!) 97.3 F (36.3 C), temperature source Oral, resp. rate 18, height 5\' 7"  (1.702 m), weight 85.5 kg, SpO2 100 %.  Medical Problem List and Plan: 1.  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    SNF pending ?[place] 2.  Antithrombotics: -DVT/anticoagulation:  Pharmaceutical: Lovenox  -can stop lovenox             -antiplatelet therapy: ASA 3. Persistent HA/Pain Management: Will change oxycodone to ultram prn. Add topamax to treat HA.             -3/15 Headache controlled with topamax 50mg  bid 4. Mood: LCSW to follow for evaluation and support.              -antipsychotic agents: N/A. Appreciate neuropsych evaluation.  5. Neuropsych: This patient is not capable of making decisions on her own behalf. 6. Skin/Wound Care: Routine pressure relief measures.  7. Fluids/Electrolytes/Nutrition: encourage po 8. HTN: Monitor BP   HCTZ stopped  Norvasc, coreg bid, Bidil, lisinopril, Zestril at home but not using             -recent hypotension d/t volume meds, slowly resuming meds as BP climbing again Vitals:   10/26/19 2014 10/27/19 0315  BP: (!) 142/83 (!) 140/93  Pulse: 79 64  Resp: 16 18  Temp: 98.1 F (36.7 C) (!) 97.3 F (36.3 C)  SpO2: 100% 100%    Continue coreg 6.25 bid and norvasc 10mg  daily  3/22-resumed lisinopril 5mg  daily  3/24 bp's better, still elevated DBP at times, increase lisinopril to 10mg  9. OA bilateral knees: continue Voltaren gel to knees qid. Avoid narcotics due to ongoing polysubstance abuse.  10. AKI:      3/22 Labs a little better, BUN decreased  3/24 with increased ACE, recheck labs tomorrow if she's still here 11. Prediabetes: Hgb A1c- 6.2. Will monitor BS ac/hs and have RD educate on CM diet.         well controlled.  12. Nausea: scopolamine patch, prn compazine. Seems to be gradually improving. 13. Dizziness: scheduled meclizine 14. qTC prlonged on EKG today. Zofran d/ced.  15/ Insomnia  Improved with melatonin 6 mg QHS.       LOS: 21 days A FACE TO FACE EVALUATION WAS PERFORMED  Meredith Staggers 10/27/2019, 10:25 AM

## 2019-10-27 NOTE — Progress Notes (Signed)
Speech Language Pathology Daily Session Note  Patient Details  Name: Tatianna Bottomley MRN: [id number] Date of Birth: Jan 23, 1951  Today's Date: 10/27/2019 SLP Individual Time: [id number] SLP Individual Time Calculation (min): 45 min  Short Term Goals: Week 3: SLP Short Term Goal 1 (Week 3): STGs=LTGs due to ELOS  Skilled Therapeutic Interventions:  Skilled treatment session targeted pt's cognition goals. SLP facilitiated session by providing supervision level cues for selective attention within structured task for 15 minutes. However during informal tasks, pt requires Min A to Mod A cues for redirection to task d/t deficits in attention. Additionally, SLP facilitated session by providing Min A to complete semi-complex peg design, Mod A for recall of information related to therapy activities from previous day as well as recall of today's schedule. Pt is aware of deficits in memory but doesn't demonstrate transfer of knowledge to decreased safety within home environment. Education and emotional support provided on recommendation for further therapy at SNF. Pt left sitting in bed, bed alarm on and all needs within reach.      Pain    Therapy/Group: Individual Therapy  Caralina Nop 10/27/2019, 11:51 AM

## 2019-10-27 NOTE — Progress Notes (Signed)
Social Work Patient ID: Alison Whitehead, female   DOB: [birth year], 69 y.o.   MRN: [id number]    SW spoke with Alison Whitehead/Medical Records at [facility] [phone]) to discuss if authorization was received for pt since [facility] Robinson/Admissions Director is out today. She reported will follow-up with SW.   SW received phone call from [facility] (p:[phone]/[phone]) stating per their Medical Director Dr. Jerene Pitch, she has stated pt no longer meets criteria for IPR. Pt last covered day will be on 3/26 and pt will need to d/c on 3/27. She states a peer to peer can be completed by 12 noon on 3/26 [phone]). SW informed medical team on above.   SW spoke with Alison Whitehead again to inform on above. She reports [facility] Falling has pt in pending stage at this time. SW waiting on follow-up.  SW returned phone call to [name] [phone]) to answer questions with regard to progress on SNF. SW discussed with her about above. SW encouraged dtr to be prepared to take pt home. SW will follow up once there is more information.   Alison Whitehead, MSW, [place] Office: [phone] Cell: [phone] Fax: [phone]

## 2019-10-27 NOTE — Progress Notes (Signed)
Occupational Therapy Session Note  Patient Details  Name: Alison Whitehead MRN: [id number] Date of Birth: 06/23/1951  Today's Date: 10/27/2019 OT Individual Time: 1300-1330 OT Individual Time Calculation (min): 30 min    Short Term Goals: Week 3: LTG=STG due to ELOS  Skilled Therapeutic Interventions/Progress Updates:  Patient met seated in recliner in agreement with OT treatment session. Patient ambulated room <> ADL apartment with RW and supervision A. Skilled OT services provided with focus on functional transfers as detailed below. Patient able to complete EOB <> supine transfer in standard bed, sit to stand from low recliner and loveseat with distant supervision A. Patient continues to make progress toward goals. Patient left lying supine in bed with call bell within reach, bed alarm activated and all needs met.   Therapy Documentation Precautions:  Precautions Precautions: Fall, Other (comment) Precaution Comments: watch BP (MD parameters <140/80 and was orthostatic at eval); can be impulsive Restrictions Weight Bearing Restrictions: No  Therapy/Group: Individual Therapy  Ciela Mahajan R [place] 10/27/2019, 7:59 AM

## 2019-10-27 NOTE — Progress Notes (Signed)
Physical Therapy Session Note  Patient Details  Name: Nerida Jenner MRN: [id number] Date of Birth: October 02, 1950  Today's Date: 10/27/2019 PT Individual Time: 1050-1115 PT Individual Time Calculation (min): 25 min   Short Term Goals: Week 3:  PT Short Term Goal 1 (Week 3): =LTGs due to ELOS  Skilled Therapeutic Interventions/Progress Updates:    Bed mobility in hospital bed at independent level. Performed functional transfers with RW throughout session at overall supervision level for safety due to occasional impulsive actions of reaching for an item without UE support or quick direction changes. Functional gait on unit with RW at supervision level with cues for upright posture and overall safety in distracting environment. Balance re-training on compliant surface while performing dual task activity to focus on divided attention and balance. Pt requiring CGA while on airex pad and engaged in dynavision while performing a peg task with recall to switch between activities and follow a pattern of pegs. Then had patient do it backwards. Extra time needed for dual task activity.   Therapy Documentation Precautions:  Precautions Precautions: Fall, Other (comment) Precaution Comments: watch BP (MD parameters <140/80 and was orthostatic at eval); can be impulsive Restrictions Weight Bearing Restrictions: No   Pain: No complaints of pain.    Therapy/Group: Individual Therapy  Canary Brim Ivory Broad, PT, DPT, CBIS  10/27/2019, 12:03 PM

## 2019-10-28 ENCOUNTER — Inpatient Hospital Stay (HOSPITAL_COMMUNITY): Payer: Medicare HMO | Admitting: Physical Therapy

## 2019-10-28 ENCOUNTER — Inpatient Hospital Stay (HOSPITAL_COMMUNITY): Payer: Medicare HMO | Admitting: Speech Pathology

## 2019-10-28 ENCOUNTER — Inpatient Hospital Stay (HOSPITAL_COMMUNITY): Payer: Medicare HMO

## 2019-10-28 ENCOUNTER — Inpatient Hospital Stay (HOSPITAL_COMMUNITY): Payer: Medicare HMO | Admitting: Occupational Therapy

## 2019-10-28 LAB — GLUCOSE, CAPILLARY
Glucose-Capillary: 105 mg/dL — ABNORMAL HIGH (ref 70–99)
Glucose-Capillary: 120 mg/dL — ABNORMAL HIGH (ref 70–99)
Glucose-Capillary: 133 mg/dL — ABNORMAL HIGH (ref 70–99)
Glucose-Capillary: 99 mg/dL (ref 70–99)

## 2019-10-28 LAB — BASIC METABOLIC PANEL
Anion gap: 9 (ref 5–15)
BUN: 27 mg/dL — ABNORMAL HIGH (ref 8–23)
CO2: 23 mmol/L (ref 22–32)
Calcium: 8.8 mg/dL — ABNORMAL LOW (ref 8.9–10.3)
Chloride: 109 mmol/L (ref 98–111)
Creatinine, Ser: 0.97 mg/dL (ref 0.44–1.00)
GFR calc Af Amer: >60 mL/min (ref >60)
GFR calc non Af Amer: >60 mL/min (ref >60)
Glucose, Bld: 110 mg/dL — ABNORMAL HIGH (ref 70–99)
Potassium: 4 mmol/L (ref 3.5–5.1)
Sodium: 141 mmol/L (ref 135–145)

## 2019-10-28 MED ORDER — MECLIZINE HCL 12.5 MG PO TABS: 12.5000 mg | ORAL_TABLET | Freq: Three times a day (TID) | ORAL | 0 refills | Status: DC | PRN

## 2019-10-28 MED ORDER — MECLIZINE HCL 25 MG PO TABS: 12.5000 mg | ORAL_TABLET | Freq: Three times a day (TID) | ORAL | Status: DC | PRN

## 2019-10-28 MED ORDER — SENNOSIDES-DOCUSATE SODIUM 8.6-50 MG PO TABS: 2.0000 | ORAL_TABLET | Freq: Every day | ORAL | 0 refills | Status: DC

## 2019-10-28 MED ORDER — CETIRIZINE HCL 10 MG PO TABS: 10.0000 mg | ORAL_TABLET | Freq: Every day | ORAL | 0 refills | Status: DC

## 2019-10-28 MED ORDER — SCOPOLAMINE 1 MG/3DAYS TD PT72: 1.0000 | MEDICATED_PATCH | TRANSDERMAL | 12 refills | Status: DC

## 2019-10-28 MED ORDER — NAPHAZOLINE-GLYCERIN 0.012-0.2 % OP SOLN: 1.0000 [drp] | Freq: Four times a day (QID) | OPHTHALMIC | 0 refills | Status: DC | PRN

## 2019-10-28 MED ORDER — TOPIRAMATE 50 MG PO TABS: 50.0000 mg | ORAL_TABLET | Freq: Two times a day (BID) | ORAL | 0 refills | Status: DC

## 2019-10-28 MED ORDER — LISINOPRIL 10 MG PO TABS: 10.0000 mg | ORAL_TABLET | Freq: Every day | ORAL | 0 refills | Status: DC

## 2019-10-28 MED ORDER — AMLODIPINE BESYLATE 10 MG PO TABS: 10.0000 mg | ORAL_TABLET | Freq: Every day | ORAL | 0 refills | Status: DC

## 2019-10-28 MED ORDER — CARVEDILOL 6.25 MG PO TABS: 6.2500 mg | ORAL_TABLET | Freq: Two times a day (BID) | ORAL | 0 refills | Status: DC

## 2019-10-28 MED ORDER — ASPIRIN 325 MG PO TABS: 325.0000 mg | ORAL_TABLET | Freq: Every day | ORAL | 0 refills | Status: DC

## 2019-10-28 MED ORDER — ATORVASTATIN CALCIUM 40 MG PO TABS: 40.0000 mg | ORAL_TABLET | Freq: Every day | ORAL | 0 refills | Status: DC

## 2019-10-28 MED ORDER — MELATONIN 3 MG PO TABS: 6.0000 mg | ORAL_TABLET | Freq: Every day | ORAL | 0 refills | Status: DC

## 2019-10-28 MED ORDER — POLYETHYLENE GLYCOL 3350 17 G PO PACK: 17.0000 g | PACK | Freq: Every day | ORAL | 0 refills | Status: DC

## 2019-10-28 MED ORDER — DICLOFENAC SODIUM 1 % TD GEL: 2.0000 g | Freq: Four times a day (QID) | TRANSDERMAL | 1 refills | Status: DC

## 2019-10-28 NOTE — Discharge Summary (Signed)
Physician Discharge Summary  Patient ID: Alison Whitehead MRN: [id number] DOB/AGE: Dec 09, 1950 69 y.o.  Admit date: 10/06/2019 Discharge date: 10/29/2019  Discharge Diagnoses:  Principal Problem:   Right pontine stroke [facility]) Active Problems:   Essential hypertension   Unilateral primary osteoarthritis, left knee   Unilateral primary osteoarthritis, right knee   Prediabetes   AKI (acute kidney injury) ([place])   Small vessel disease, cerebrovascular   Labile blood glucose   Vascular headache   Discharged Condition: stable   Significant Diagnostic Studies: N/A   Labs:  Basic Metabolic Panel: BMP Latest Ref Rng & Units 10/28/2019 10/24/2019 10/19/2019  Glucose 70 - 99 mg/dL 110(H) 107(H) 100(H)  BUN 8 - 23 mg/dL 27(H) 27(H) 31(H)  Creatinine 0.44 - 1.00 mg/dL 0.97 1.11(H) 1.09(H)  Sodium 135 - 145 mmol/L 141 141 136  Potassium 3.5 - 5.1 mmol/L 4.0 4.3 4.4  Chloride 98 - 111 mmol/L 109 108 104  CO2 22 - 32 mmol/L 23 23 24   Calcium 8.9 - 10.3 mg/dL 8.8(L) 9.2 8.8(L)    CBC: CBC Latest Ref Rng & Units 10/24/2019 10/18/2019 10/17/2019  WBC 4.0 - 10.5 K/uL 5.4 4.8 5.0  Hemoglobin 12.0 - 15.0 g/dL 11.1(L) 11.0(L) 11.3(L)  Hematocrit 36.0 - 46.0 % 35.3(L) 35.3(L) 35.7(L)  Platelets 150 - 400 K/uL 202 200 199    CBG: Recent Labs  Lab 10/27/19 1624 10/27/19 2113 10/28/19 0610 10/28/19 1134 10/28/19 1629  GLUCAP 91 131* 105* 120* 133*    Brief HPI:   Alison Whitehead is a 69 y.o. female with history of HTN--no meds times month, OA bilateral knees, cocaine abuse who was admitted on 10/01/2019 with onset of weakness and dizziness followed by nausea and vomiting.  UDS was positive for opiates and cocaine's and blood pressure markedly elevated at 183/120.  MRI brain done revealing acute/early subacute microhemorrhage with hemorrhagic lacunar infarcts in right pons.  CT abdomen pelvis showed enlarged fibroid uterus with fairly extensive diverticulosis.  2D echo showed EF of 55 to  60% with severe dilatation of left ventricle and mild dilatation of ascending aorta 4.3 cm.  Stroke was felt to be secondary to small vessel disease and aspirin added for secondary stroke prevention.  Multiple blood pressure medicines added for better control. Therapy ongoing and patient limited by HA, vestibular symptoms, poor safety awareness and well as OA bilateral knees. CIR was recommended due to functional decline.    Hospital Course: Alison Whitehead was admitted to rehab 10/06/2019 for inpatient therapies to consist of PT, ST and OT at least three hours five days a week. Past admission physiatrist, therapy team and rehab RN have worked together to provide customized collaborative inpatient rehab. Her blood pressures were monitored on TID basis and started trending downwards due to multiple medications which were on board.  She did develop orthostatic hypotension requiring fluid bolus for hydration and medications were held briefly. HCTZ and Bidil has been discontinued and other medications were slowly resumed with reasonable control at discharge. She was encouraged to increase fluid intake due to pre-renal azotemia.  Bilateral knee OA's have been managed with use of Voltaren gel qid for local measures.    Melatonin was added to help with insomnia.  She has had issues with persistent headache and Topamax was added with good control.  Scopolamine patch was added to help manage vestibular symptoms. Due to prediabetes, blood sugars were monitored on achs  basis on carb modified diet and is reasonably controlled. She is continent of bowel and bladder.  She has made gains during rehab stay with decrease in impulsivity but continues to have cognitive deficits. SNF search was ongoing due to need for supervision however her insurance declined further rehab stay. Daughter and sister plan on coordinating care to provide supervision for safety after discharge. She will continue to receive follow up [place], [place],  [place] and [facility]ST by [facility]  after discharge   Rehab course: During patient's stay in rehab weekly team conferences were held to monitor patient's progress, set goals and discuss barriers to discharge. At admission, patient required min assist with basic ADLs and mod assist with mobility. She exhibited moderate cognitive deficits affecting problem-solving, short-term memory and selective attention with MOCA score 14/30. She  has had improvement in activity tolerance, balance, postural control as well as ability to compensate for deficits.  She is able to complete ADL tasks with supervision. She is independent for transfers and requires supervision with verbal cues to ambulate 150 feet with rolling walker.  She requires supervision to min verbal cues to complete functional and mildly complex tasks.  Family education was completed regarding all aspects of safety as well as need for assistance with cognitive tasks and for medication management.   Disposition: Home  Diet: Heart healthy.   Special Instructions: 1. No driving or strenuous activity till cleared by MD.  2.  Family to assist with medications and PCP follow up.    Discharge Instructions    Ambulatory referral to Physical Medicine Rehab   Complete by: As directed    1-2 weeks TC appt     Allergies as of 10/29/2019      Reactions   Penicillins Itching      Medication List    STOP taking these medications   amlodipine-benazepril 2.5-10 MG capsule Commonly known as: LOTREL   aspirin EC 81 MG tablet Replaced by: aspirin 325 MG tablet   hydrochlorothiazide 12.5 MG capsule Commonly known as: MICROZIDE   HYDROcodone-acetaminophen 10-325 MG tablet Commonly known as: NORCO   loratadine 10 MG tablet Commonly known as: CLARITIN     TAKE these medications   acetaminophen 325 MG tablet Commonly known as: TYLENOL Take 1-2 tablets (325-650 mg total) by mouth every 4 (four) hours as needed for mild pain.   amLODipine  10 MG tablet Commonly known as: NORVASC Take 1 tablet (10 mg total) by mouth daily.   aspirin 325 MG tablet Take 1 tablet (325 mg total) by mouth daily. Replaces: aspirin EC 81 MG tablet   atorvastatin 40 MG tablet Commonly known as: LIPITOR Take 1 tablet (40 mg total) by mouth daily at 6 PM.   carvedilol 6.25 MG tablet Commonly known as: COREG Take 1 tablet (6.25 mg total) by mouth 2 (two) times daily with a meal.   cetirizine 10 MG tablet Commonly known as: ZYRTEC Take 1 tablet (10 mg total) by mouth at bedtime.   diclofenac sodium 1 % Gel Commonly known as: VOLTAREN Apply 2 g topically 4 (four) times daily. What changed: how much to take Notes to patient: To  knees   lisinopril 10 MG tablet Commonly known as: ZESTRIL Take 1 tablet (10 mg total) by mouth daily.   meclizine 12.5 MG tablet Commonly known as: ANTIVERT Take 1 tablet (12.5 mg total) by mouth 3 (three) times daily as needed for dizziness. What changed: when to take this   melatonin 3 MG Tabs tablet Take 2 tablets (6 mg total) by mouth at bedtime.   multivitamin with minerals Tabs  tablet Take 1 tablet by mouth daily.   Muscle Rub 10-15 % Crea Apply 1 application topically 2 (two) times daily as needed for muscle pain. To bilateral knees   naphazoline-glycerin 0.012-0.2 % Soln Commonly known as: CLEAR EYES REDNESS Place 1-2 drops into both eyes 4 (four) times daily as needed for eye irritation.   polyethylene glycol 17 g packet Commonly known as: MIRALAX / GLYCOLAX Take 17 g by mouth daily.   scopolamine 1 MG/3DAYS Commonly known as: TRANSDERM-SCOP Place 1 patch (1.5 mg total) onto the skin every 3 (three) days. Start on Sunday   senna-docusate 8.6-50 MG tablet Commonly known as: Senokot-S Take 2 tablets by mouth at bedtime.   topiramate 50 MG tablet Commonly known as: TOPAMAX Take 1 tablet (50 mg total) by mouth 2 (two) times daily.       Contact information for follow-up providers     Raulkar, Clide Deutscher, MD Follow up.   Specialty: Physical Medicine and Rehabilitation Contact information: [id number] [place] [address] [phone]        [facility] Follow up.   Specialty: Neurology Contact information: [address] [place]. Call.   Why: for post hospital follow up Contact information: [place]                   Signed: Bary Leriche 10/31/2019, 4:27 PM

## 2019-10-28 NOTE — Progress Notes (Signed)
Occupational Therapy Discharge Summary  Patient Details  Name: Alison Whitehead MRN: [id number] Date of Birth: 08-19-1950   Patient has met 10 of 10 long term goals due to improved activity tolerance, improved balance, postural control, improved attention, improved awareness and improved coordination.  Patient to discharge at overall Supervision level.  Patient's care partner is independent to provide the necessary cognitive assistance at discharge.    Pt and family have been educated on need for 24/7 supervision 2/2 decreased safety awareness and functional memory deficits. Pt's insurance no longer covering CIR as of 3/27 and pt will discharge home with intermittent supervision from family. Pt's family is aware and understands the recommendation for 24/7 supervision to maximize safety within the home and are working to provide this between family and friends  Reasons goals not met: n/a  Recommendation:  Patient will benefit from ongoing skilled OT services in home health setting to continue to advance functional skills in the area of BADL and iADL.  Equipment: BSC, TTB  Reasons for discharge: treatment goals met  Patient/family agrees with progress made and goals achieved: Yes  OT Discharge Precautions/Restrictions  Precautions Precautions: Fall;Other (comment) Restrictions Weight Bearing Restrictions: No Pain Pain Assessment Pain Scale: 0-10 Pain Score: 0-No pain Faces Pain Scale: No hurt ADL ADL Eating: Independent Grooming: Modified independent, Supervision/safety Where Assessed-Grooming: Standing at sink Upper Body Bathing: Modified independent Where Assessed-Upper Body Bathing: Shower Lower Body Bathing: Supervision/safety Where Assessed-Lower Body Bathing: Shower Upper Body Dressing: Modified independent (Device) Where Assessed-Upper Body Dressing: Edge of bed Lower Body Dressing: Supervision/safety Where Assessed-Lower Body Dressing: Edge of bed Toileting:  Supervision/safety Where Assessed-Toileting: Glass blower/designer: Distant supervision Armed forces technical officer Method: Counselling psychologist: Radiographer, therapeutic: Close supervison Clinical cytogeneticist Method: Librarian, academic: Civil engineer, contracting with back Vision Baseline Vision/History: Wears glasses Patient Visual Report: No change from baseline Vision Assessment?: No apparent visual deficits Eye Alignment: Within Functional Limits Ocular Range of Motion: Within Functional Limits Alignment/Gaze Preference: Within Defined Limits Visual Fields: No apparent deficits Perception  Perception: Within Functional Limits Praxis Praxis: Intact Cognition Overall Cognitive Status: Impaired/Different from baseline Arousal/Alertness: Awake/alert Orientation Level: Oriented X4 Attention: Alternating Focused Attention: Appears intact Sustained Attention: Appears intact Alternating Attention: Impaired Alternating Attention Impairment: Functional basic;Functional complex Memory: Impaired Memory Impairment: Retrieval deficit;Storage deficit;Decreased short term memory Decreased Short Term Memory: Verbal basic;Functional basic Awareness Impairment: Intellectual impairment Problem Solving: Impaired Problem Solving Impairment: Verbal basic;Functional basic Executive Function: Reasoning;Decision Making Reasoning: Impaired Reasoning Impairment: Functional basic;Verbal basic Decision Making: Appears intact Self Monitoring: Impaired Self Monitoring Impairment: Verbal basic Behaviors: Impulsive(Greatly improved since initial evaluation.) Safety/Judgment: Impaired Comments: Patient requires Min cueing for safety awareness with functional mobility, transfers, and self-care tasks in standing. Sensation Sensation Light Touch: Appears Intact Proprioception: Appears Intact Coordination Gross Motor Movements are Fluid and Coordinated: Yes Fine Motor Movements are  Fluid and Coordinated: Yes Motor  Motor Motor: Within Functional Limits Motor - Discharge Observations: Generalized weakness Mobility  Bed Mobility Bed Mobility: Supine to Sit;Sit to Supine;Rolling Left;Rolling Right Rolling Right: Independent Rolling Left: Independent Supine to Sit: Independent Sit to Supine: Independent Scooting to HOB: Supervision/Verbal Cueing Transfers Sit to Stand: Supervision/Verbal cueing Stand to Sit: Supervision/Verbal cueing  Trunk/Postural Assessment  Cervical Assessment Cervical Assessment: Exceptions to WFL(Forward head) Thoracic Assessment Thoracic Assessment: Exceptions to WFL(Rounded shoulders) Lumbar Assessment Lumbar Assessment: Exceptions to [facility] Postural Control Postural Control: Deficits on evaluation(Delayed)  Balance Balance Balance Assessed: Yes Static Sitting Balance Static Sitting - Level of Assistance: 7:  Independent Dynamic Sitting Balance Dynamic Sitting - Level of Assistance: 7: Independent Static Standing Balance Static Standing - Level of Assistance: 5: Stand by assistance Dynamic Standing Balance Dynamic Standing - Level of Assistance: 5: Stand by assistance Extremity/Trunk Assessment RUE Assessment RUE Assessment: Within Functional Limits Passive Range of Motion (PROM) Comments: WFL Active Range of Motion (AROM) Comments: WFL although limited by pain 2/2 arthritis General Strength Comments: DNT LUE Assessment LUE Assessment: Within Functional Limits Passive Range of Motion (PROM) Comments: WFL Active Range of Motion (AROM) Comments: WFL although limited by pain 2/2 arthritis General Strength Comments: DNT   Alison Whitehead 10/28/2019, 12:36 PM

## 2019-10-28 NOTE — Discharge Instructions (Signed)
Inpatient Rehab Discharge Instructions  Elizette Pafford Discharge date and time:  10/29/19  Activities/Precautions/ Functional Status: Activity: no lifting, driving, or strenuous exercise till cleared by MD Diet: cardiac diet and diabetic diet Wound Care: none needed   Functional status:  ___ No restrictions     ___ Walk up steps independently _X__ 24/7 supervision/assistance   ___ Walk up steps with assistance ___ Intermittent supervision/assistance  ___ Bathe/dress independently ___ Walk with walker     ___ Bathe/dress with assistance ___ Walk Independently    _X__ Shower independently ___ Walk with assistance    ___ Shower with assistance _X__ No alcohol     ___ Return to work/school ________    COMMUNITY REFERRALS UPON DISCHARGE:    Home Health:   PT     OT    ST    SW                   Agency: [facility] [facility] Phone:  [phone] *Please expect follow-up within two days of your discharge to schedule your home visit. If you have not received follow-up, be sure to contact the agency.*   Medical Equipment/Items Ordered: tube transfer bench and rolling walker                                                 Agency/Supplier: [facility] [phone]   Special Instructions: 1. Family need to assist with medication--set pill box for the week. 2. No alcohol or driving.  3. You need to call your primary care for post hospital follow up.    STROKE/TIA DISCHARGE INSTRUCTIONS SMOKING Cigarette smoking nearly doubles your risk of having a stroke & is the single most alterable risk factor  If you smoke or have smoked in the last 12 months, you are advised to quit smoking for your health.  Most of the excess cardiovascular risk related to smoking disappears within a year of stopping.  Ask you doctor about anti-smoking medications  [place] Quit Line: 1-800-QUIT NOW  Free Smoking Cessation Classes [phone]  CHOLESTEROL Know your levels; limit fat &  cholesterol in your diet  Lipid Panel     Component Value Date/Time   CHOL 230 (H) 11/08/2007 2124   TRIG 129 11/08/2007 2124   HDL 69 11/08/2007 2124   CHOLHDL 3.3 Ratio 11/08/2007 2124   VLDL 26 11/08/2007 2124   [place] 135 (H) 11/08/2007 2124      Many patients benefit from treatment even if their cholesterol is at goal.  Goal: Total Cholesterol (CHOL) less than 160  Goal:  Triglycerides (TRIG) less than 150  Goal:  HDL greater than 40  Goal:  LDL (LDLCALC) less than 100   BLOOD PRESSURE American Stroke Association blood pressure target is less that 120/80 mm/Hg  Your discharge blood pressure is:  BP: 115/83  Monitor your blood pressure  Limit your salt and alcohol intake  Many individuals will require more than one medication for high blood pressure  DIABETES (A1c is a blood sugar average for last 3 months) Goal HGBA1c is under 7% (HBGA1c is blood sugar average for last 3 months)  Diabetes: Pre diabetes    Lab Results  Component Value Date   HGBA1C 6.2 (H) 10/02/2019     Your HGBA1c can be lowered with medications, healthy diet, and exercise.  Check your blood  sugar as directed by your physician  Call your physician if you experience unexplained or low blood sugars.  PHYSICAL ACTIVITY/REHABILITATION Goal is 30 minutes at least 4 days per week  Activity: No driving, Therapies: see above Return to work: N/a  Activity decreases your risk of heart attack and stroke and makes your heart stronger.  It helps control your weight and blood pressure; helps you relax and can improve your mood.  Participate in a regular exercise program.  Talk with your doctor about the best form of exercise for you (dancing, walking, swimming, cycling).  DIET/WEIGHT Goal is to maintain a healthy weight  Your discharge diet is:  Diet Order            Diet heart healthy/carb modified Room service appropriate? Yes; Fluid consistency: Thin  Diet effective now              liquids Your height is:  Height: 5\' 7"  (170.2 cm) Your current weight is: Weight: 188 lbs Your Body Mass Index (BMI) is:  BMI (Calculated): 29.52  Following the type of diet specifically designed for you will help prevent another stroke.  Your goal weight is: 159 lbs  Your goal Body Mass Index (BMI) is 19-24.  Healthy food habits can help reduce 3 risk factors for stroke:  High cholesterol, hypertension, and excess weight.  RESOURCES Stroke/Support Group:  Call [phone]   STROKE EDUCATION PROVIDED/REVIEWED AND GIVEN TO PATIENT Stroke warning signs and symptoms How to activate emergency medical system (call 911). Medications prescribed at discharge. Need for follow-up after discharge. Personal risk factors for stroke. Pneumonia vaccine given:  Flu vaccine given:  My questions have been answered, the writing is legible, and I understand these instructions.  I will adhere to these goals & educational materials that have been provided to me after my discharge from the hospital.     My questions have been answered and I understand these instructions. I will adhere to these goals and the provided educational materials after my discharge from the hospital.  Patient/Caregiver Signature _______________________________ Date __________  Clinician Signature _______________________________________ Date __________  Please bring this form and your medication list with you to all your follow-up doctor's appointments.

## 2019-10-28 NOTE — Progress Notes (Signed)
Occupational Therapy Session Note  Patient Details  Name: Alison Whitehead MRN: [id number] Date of Birth: 29-Apr-1951  Today's Date: 10/28/2019 OT Individual Time: 1030-1100 OT Individual Time Calculation (min): 30 min    Short Term Goals: Week 3:  STG = LTG  Skilled Therapeutic Interventions/Progress Updates:  Session 1: Patient met seated EOB finishing up breakfast meal. Patient requesting to complete shower. Patient ambulated into bathroom with distant supervision. Prior to sitting on TTB, patient attempted to remove LB clothing in standing with UE support on RW. Patient education on sitting to doff/don LB clothing for safety with patient noting that she was aware, but got confused in her mind. UB/LB bath/dress seated on TTB with supervision A. Session concluded with patient seated in recliner with call bell within reach, chair alarm activated, and all needs met.   Session 2: Patient met seated in recliner in agreement with OT treatment session. 0/10 pain at rest and with activity aside from arthritic related discomfort in bilateral shoulders with reaching task this date. Patient requesting to complete grooming task. With question cues, patient able to identify safest way to transport ADL items over to sink. Patient ambulated to sink with distant supervision A and stood to complete shaving. Ambulation with supervision A to and from therapy gym. Therapeutic activity with focus on dynamic standing balance, standing tolerance, BUE AROM, and alternating attention with checkers and peg board simultaneously. Session concluded with patient seated in recliner with call bell within reach, chair alarm activated, and all needs met.    Therapy Documentation Precautions:  Precautions Precautions: Fall, Other (comment) Precaution Comments: watch BP (MD parameters <140/80 and was orthostatic at eval); can be impulsive Restrictions Weight Bearing Restrictions: No  Therapy/Group: Individual  Therapy  Charlesia Canaday R [place]-Davis 10/28/2019, 12:17 PM

## 2019-10-28 NOTE — Progress Notes (Addendum)
[place] PHYSICAL MEDICINE & REHABILITATION PROGRESS NOTE   Subjective/Complaints:  No new complaints. Denies pain  ROS: Patient denies fever, rash, sore throat, blurred vision, nausea, vomiting, diarrhea, cough, shortness of breath or chest pain, joint or back pain, headache, or mood change. .     Objective:   No results found. No results for input(s): WBC, HGB, HCT, PLT in the last 72 hours. Recent Labs    10/28/19 0622  NA 141  K 4.0  CL 109  CO2 23  GLUCOSE 110*  BUN 27*  CREATININE 0.97  CALCIUM 8.8*    Intake/Output Summary (Last 24 hours) at 10/28/2019 1046 Last data filed at 10/27/2019 1901 Gross per 24 hour  Intake 440 ml  Output --  Net 440 ml     Physical Exam: Vital Signs Blood pressure (!) 143/60, pulse 75, temperature 98.4 F (36.9 C), resp. rate 18, height 5\' 7"  (1.702 m), weight 85.5 kg, SpO2 98 %.  Constitutional: No distress . Vital signs reviewed. HEENT: EOMI, oral membranes moist Neck: supple Cardiovascular: RRR without murmur. No JVD    Respiratory/Chest: CTA Bilaterally without wheezes or rales. Normal effort    GI/Abdomen: BS +, non-tender, non-distended Ext: no clubbing, cyanosis, or edema Psych: pleasant and cooperative Skin: No evidence of breakdown, no evidence of rash   Neurologic:  Motor: Grossly 5/5 throughout RUE and RLE. LUE and LLE 4+/5     Assessment/Plan: 1. Functional deficits secondary to right posterior pontine infarct which require 3+ hours per day of interdisciplinary therapy in a comprehensive inpatient rehab setting.  Physiatrist is providing close team supervision and 24 hour management of active medical problems listed below.  Physiatrist and rehab team continue to assess barriers to discharge/monitor patient progress toward functional and medical goals  Care Tool:  Bathing    Body parts bathed by patient: Right arm, Left arm, Chest, Abdomen, Front perineal area, Buttocks, Right upper leg, Left upper leg,  Right lower leg, Left lower leg, Face         Bathing assist Assist Level: Supervision/Verbal cueing(Shower with cueing for safety)     Upper Body Dressing/Undressing Upper body dressing   What is the patient wearing?: Pull over shirt    Upper body assist Assist Level: Independent with assistive device    Lower Body Dressing/Undressing Lower body dressing      What is the patient wearing?: Pants, Underwear/pull up     Lower body assist Assist for lower body dressing: Supervision/Verbal cueing     Toileting Toileting Toileting Activity did not occur (Clothing management and hygiene only): N/A (no void or bm)  Toileting assist Assist for toileting: Supervision/Verbal cueing     Transfers Chair/bed transfer  Transfers assist     Chair/bed transfer assist level: Supervision/Verbal cueing     Locomotion Ambulation   Ambulation assist   Ambulation activity did not occur: Safety/medical concerns(orthostatic)  Assist level: Supervision/Verbal cueing Assistive device: Walker-rolling Max distance: 150'   Walk 10 feet activity   Assist  Walk 10 feet activity did not occur: Safety/medical concerns(orthostatic)  Assist level: Supervision/Verbal cueing Assistive device: Walker-rolling   Walk 50 feet activity   Assist Walk 50 feet with 2 turns activity did not occur: Safety/medical concerns(orthostatic)  Assist level: Supervision/Verbal cueing Assistive device: Walker-rolling    Walk 150 feet activity   Assist Walk 150 feet activity did not occur: Safety/medical concerns(orthostatic)  Assist level: Supervision/Verbal cueing Assistive device: Walker-rolling    Walk 10 feet on uneven surface  activity  Assist Walk 10 feet on uneven surfaces activity did not occur: Safety/medical concerns(orthostatic)   Assist level: Supervision/Verbal cueing     Wheelchair     Assist Will patient use wheelchair at discharge?: No   Wheelchair activity did  not occur: N/A  Wheelchair assist level: Moderate Assistance - Patient 50 - 74% Max wheelchair distance: 16ft    Wheelchair 50 feet with 2 turns activity    Assist    Wheelchair 50 feet with 2 turns activity did not occur: N/A       Wheelchair 150 feet activity     Assist  Wheelchair 150 feet activity did not occur: N/A       Blood pressure (!) 143/60, pulse 75, temperature 98.4 F (36.9 C), resp. rate 18, height 5\' 7"  (1.702 m), weight 85.5 kg, SpO2 98 %.  Medical Problem List and Plan: 1.  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    SNF pending vs home tomorrow 3/27  Patient to see Dr. Ranell Patrick in the office for transitional care encounter in 1-2 weeks.  2.  Antithrombotics: -DVT/anticoagulation:  Pharmaceutical: Lovenox  -stopped lovenox             -antiplatelet therapy: ASA 3. Persistent HA/Pain Management: Will change oxycodone to ultram prn. Add topamax to treat HA.             -3/15 Headache controlled with topamax 50mg  bid 4. Mood: LCSW to follow for evaluation and support.              -antipsychotic agents: N/A. Appreciate neuropsych evaluation.  5. Neuropsych: This patient is not capable of making decisions on her own behalf. 6. Skin/Wound Care: Routine pressure relief measures.  7. Fluids/Electrolytes/Nutrition: encourage po 8. HTN: Monitor BP   HCTZ, bidil stopped    Vitals:   10/27/19 1944 10/28/19 0521  BP: 118/86 (!) 143/60  Pulse: 86 75  Resp: 18 18  Temp: 98 F (36.7 C) 98.4 F (36.9 C)  SpO2: 97% 98%       3/26 bp's under reasonable control with coreg 6.25mg  bid, norvasc 10mg  daily and lisinopril 10mg  daily 9. OA bilateral knees: continue Voltaren gel to knees qid. Avoid narcotics due to ongoing polysubstance abuse.  10. AKI:     3/22 Labs a little better, BUN decreased  3/27 BUN/Cr holding steady with  resumption/increase of ACE   -follow up as outpt 11. Prediabetes: Hgb A1c- 6.2. Will monitor BS ac/hs and have RD educate on CM diet.         well controlled.  12. Nausea: scopolamine patch, prn compazine. Seems to be gradually improving. 13. Dizziness: scheduled meclizine--change to PRN 14. qTC prlonged on EKG today. Zofran d/ced.  15/ Insomnia  Improved with melatonin 6 mg QHS.       LOS: 22 days A FACE TO [place] 10/28/2019, 10:46 AM

## 2019-10-28 NOTE — Progress Notes (Signed)
Social Work Patient ID: Alison Whitehead, female   DOB: 02-Oct-1950, 69 y.o.   MRN: [id number]   Per medical team, pt will d/c tomorrow due to insurance as pt last covered day will be toady.   SW left message Darol Destine Records at [facility] [phone]) to discuss authorization approval as pt will be discharged tomorrow if there is no update. SW waiting on follow-up.   SW spoke with [name] [phone]) to provide updates on above and will more than likely have to discharge patient tomorrow. SW waiting on updates for preferred H[facility]. Reports that pt will continue to have intermittent support from [facility], [facility], and [facility].   [facility]- declined  [facility]- would have 1 week delay in start of care.  Tentative HH for PT/OT/ST/SW with [facility]; accepted by liaison Britney [phone]). SW to follow-up by end of the day to confirm if pt will d/c to home or SNF.   SW spoke with [facility] to inquire about SNF authorization. SW informed the authorization remains pending. SW requested to have a nurse call SW back as SW leaves by 4:30pm.   SW left message Darol Destine Records at [facility] [phone]) to inquire if there were updates. SW indicated if no updates by the time SW leaves, will proceed with moving with d/c to home.   SW followed up with [name] we will now move forward with discharge to home as no follow-up from SNF or HHA. SW informed on HHA- [facility] HH and process. SW met with pt in room to discuss above as well. SW confirms DME: TTB and RW delivered.   Loralee Pacas, MSW, [place] Office: [phone] Cell: [phone] Fax: [phone]

## 2019-10-28 NOTE — Progress Notes (Signed)
Speech Language Pathology Daily Session Note  Patient Details  Name: Alison Whitehead MRN: [id number] Date of Birth: [birth year]  Today's Date: 10/28/2019 SLP Individual Time: 0730-0800 SLP Individual Time Calculation (min): 30 min  Short Term Goals: Week 3: SLP Short Term Goal 1 (Week 3): STGs=LTGs due to ELOS  Skilled Therapeutic Interventions: Skilled treatment session focused on cognitive goals. SLP facilitated session by providing extra time for complex problem solving and organization money management task. Patient completed task with 100% accuracy and self-monitoring and corrected errors independently. Patient left upright in bed with alarm on and all needs within reach. Continue with current plan of care.      Pain No/Denies Pain   Therapy/Group: Individual Therapy  Alison Whitehead 10/28/2019, 3:32 PM

## 2019-10-28 NOTE — Progress Notes (Signed)
Physical Therapy Session Note  Patient Details  Name: Alison Whitehead MRN: [id number] Date of Birth: 1950/10/23  Today's Date: 10/28/2019 PT Individual Time: 1435-1500 PT Individual Time Calculation (min): 25 min   Short Term Goals: Week 3:  PT Short Term Goal 1 (Week 3): =LTGs due to ELOS  Skilled Therapeutic Interventions/Progress Updates:   Pt in recliner and agreeable to therapy, no c/o pain. Sit<>stand mod/i to RW and ambulated to/from therapy gym w/ supervision. Performed NuStep 5 min x2 @ level 4 to work on Ship broker and endurance. Increased work of breathing this session, but resolves w/ brief rest. Pt w/ questions regarding risk of having another stroke, educated on modifiable risk factors and continuing to build up her endurance/functional level so that she can have a more active lifestyle. Pt verbalizes understanding of education and is eager to decrease her risk of having another stroke. Discussed active lifestyle options including [employer]/silver sneakers program. Pt previously exercised at [employer] and is familiar w/ silver sneakers. Ambulated back to room w/ supervision. Ended session in recliner, all needs in reach.   Therapy Documentation Precautions:  Precautions Precautions: Fall, Other (comment) Precaution Comments: watch BP (MD parameters <140/80 and was orthostatic at eval); can be impulsive Restrictions Weight Bearing Restrictions: No  Therapy/Group: Individual Therapy  [name] Clent Demark 10/28/2019, 3:07 PM

## 2019-10-29 DIAGNOSIS — G441 Vascular headache, not elsewhere classified: Secondary | ICD-10-CM

## 2019-10-29 DIAGNOSIS — R7309 Other abnormal glucose: Secondary | ICD-10-CM

## 2019-10-29 LAB — GLUCOSE, CAPILLARY
Glucose-Capillary: 92 mg/dL (ref 70–99)
Glucose-Capillary: 127 mg/dL — ABNORMAL HIGH (ref 70–99)

## 2019-10-29 MED FILL — CETIRIZINE HCL 10 MG TABS: 10 | 30 days supply | Qty: 30 | Fill #0

## 2019-10-29 MED FILL — ASPIRIN EC 325 MG TABLET: 325 | 100 days supply | Qty: 100 | Fill #0

## 2019-10-29 MED FILL — POLYETHYLENE GLYCOL 3350 PO: 17 | 30 days supply | Qty: 476 | Fill #0

## 2019-10-29 MED FILL — LISINOPRIL 10 MG TABS: 10 | 30 days supply | Qty: 30 | Fill #0

## 2019-10-29 MED FILL — AMLODIPINE BESYLATE 10 MG T: 10 | 30 days supply | Qty: 30 | Fill #0

## 2019-10-29 MED FILL — STOOL SOFTENER/LAXATIVE 50-: 50-8.6 | 50 days supply | Qty: 100 | Fill #0

## 2019-10-29 MED FILL — ATORVASTATIN 40 MG TABLET: 40 | 30 days supply | Qty: 30 | Fill #0

## 2019-10-29 MED FILL — CARVEDILOL 6.25 MG TABLET: 6.25 | 30 days supply | Qty: 60 | Fill #0

## 2019-10-29 MED FILL — TOPIRAMATE 50 MG TABLET: 50 | 30 days supply | Qty: 60 | Fill #0

## 2019-10-29 MED FILL — MECLIZINE 25 MG TABLET: 25 | 10 days supply | Qty: 15 | Fill #0

## 2019-10-29 NOTE — Plan of Care (Signed)
  Problem: Consults Goal: RH STROKE PATIENT EDUCATION Description: See Patient Education module for education specifics  Outcome: Progressing Goal: Nutrition Consult-if indicated Outcome: Progressing Goal: Diabetes Guidelines if Diabetic/Glucose > 140 Description: If diabetic or lab glucose is > 140 mg/dl - Initiate Diabetes/Hyperglycemia Guidelines & Document Interventions  Outcome: Progressing   Problem: RH BOWEL ELIMINATION Goal: RH STG MANAGE BOWEL WITH ASSISTANCE Description: STG Manage Bowel with  moderate Assistance. Outcome: Progressing Goal: RH STG MANAGE BOWEL W/MEDICATION W/ASSISTANCE Description: STG Manage Bowel with Medication with moderate Assistance. Outcome: Progressing   Problem: RH SKIN INTEGRITY Goal: RH STG SKIN FREE OF INFECTION/BREAKDOWN Description: Skin free from infection entire stay on rehab Outcome: Progressing Goal: RH STG ABLE TO PERFORM INCISION/WOUND CARE W/ASSISTANCE Description: STG Able To Perform Incision/Wound Care With Assistance. Outcome: Progressing   Problem: RH SAFETY Goal: RH STG ADHERE TO SAFETY PRECAUTIONS W/ASSISTANCE/DEVICE Description: STG Adhere to Safety Precautions With  moderate Assistance/Device. Outcome: Progressing   Problem: RH PAIN MANAGEMENT Goal: RH STG PAIN MANAGED AT OR BELOW PT'S PAIN GOAL Description: Pain less than 2 Outcome: Progressing   Problem: RH KNOWLEDGE DEFICIT Goal: RH STG INCREASE KNOWLEDGE OF HYPERTENSION Description: Moderate assistance Outcome: Progressing

## 2019-10-29 NOTE — Progress Notes (Signed)
[place] PHYSICAL MEDICINE & REHABILITATION PROGRESS NOTE   Subjective/Complaints: Patient seen sitting up at the edge of her bed this morning.  She states she slept well overnight.  She states she is ready to go home.  She will give me updates on how her roommate is doing.  ROS: Denies CP, SOB, N/V/D   Objective:   No results found. No results for input(s): WBC, HGB, HCT, PLT in the last 72 hours. Recent Labs    10/28/19 0622  NA 141  K 4.0  CL 109  CO2 23  GLUCOSE 110*  BUN 27*  CREATININE 0.97  CALCIUM 8.8*    Intake/Output Summary (Last 24 hours) at 10/29/2019 1325 Last data filed at 10/29/2019 0730 Gross per 24 hour  Intake 240 ml  Output --  Net 240 ml     Physical Exam: Vital Signs Blood pressure (!) 130/95, pulse 72, temperature 98 F (36.7 C), temperature source Oral, resp. rate 18, height 5\' 7"  (1.702 m), weight 85.5 kg, SpO2 98 %. Constitutional: No distress . Vital signs reviewed. HENT: Normocephalic.  Atraumatic. Eyes: EOMI. No discharge. Cardiovascular: No JVD. Respiratory: Normal effort.  No stridor. GI: Non-distended. Skin: Warm and dry.  Intact. Psych: Normal mood.  Normal behavior. Musc: No edema in extremities.  No tenderness in extremities. Neurologic: Alert Motor: Grossly 5/5 throughout RUE and RLE.  LUE and LLE 4+/5, improving   Assessment/Plan: 1. Functional deficits secondary to right posterior pontine infarct which require 3+ hours per day of interdisciplinary therapy in a comprehensive inpatient rehab setting.  Physiatrist is providing close team supervision and 24 hour management of active medical problems listed below.  Physiatrist and rehab team continue to assess barriers to discharge/monitor patient progress toward functional and medical goals  Care Tool:  Bathing    Body parts bathed by patient: Right arm, Left arm, Chest, Abdomen, Front perineal area, Buttocks, Right upper leg, Left upper leg, Right lower leg, Left lower  leg, Face         Bathing assist Assist Level: Supervision/Verbal cueing(Shower with cueing for safety)     Upper Body Dressing/Undressing Upper body dressing   What is the patient wearing?: Pull over shirt    Upper body assist Assist Level: Independent with assistive device    Lower Body Dressing/Undressing Lower body dressing      What is the patient wearing?: Pants, Underwear/pull up     Lower body assist Assist for lower body dressing: Supervision/Verbal cueing     Toileting Toileting Toileting Activity did not occur (Clothing management and hygiene only): N/A (no void or bm)  Toileting assist Assist for toileting: Supervision/Verbal cueing     Transfers Chair/bed transfer  Transfers assist     Chair/bed transfer assist level: Independent with assistive device Chair/bed transfer assistive device: Armrests, Programmer, multimedia   Ambulation assist   Ambulation activity did not occur: Safety/medical concerns(orthostatic)  Assist level: Supervision/Verbal cueing Assistive device: Walker-rolling Max distance: 150'   Walk 10 feet activity   Assist  Walk 10 feet activity did not occur: Safety/medical concerns(orthostatic)  Assist level: Supervision/Verbal cueing Assistive device: Walker-rolling   Walk 50 feet activity   Assist Walk 50 feet with 2 turns activity did not occur: Safety/medical concerns(orthostatic)  Assist level: Supervision/Verbal cueing Assistive device: Walker-rolling    Walk 150 feet activity   Assist Walk 150 feet activity did not occur: Safety/medical concerns(orthostatic)  Assist level: Supervision/Verbal cueing Assistive device: Walker-rolling    Walk 10 feet on  uneven surface  activity   Assist Walk 10 feet on uneven surfaces activity did not occur: Safety/medical concerns(orthostatic)   Assist level: Supervision/Verbal cueing Assistive device: Aeronautical engineer Will  patient use wheelchair at discharge?: No   Wheelchair activity did not occur: N/A  Wheelchair assist level: Moderate Assistance - Patient 50 - 74% Max wheelchair distance: 22ft    Wheelchair 50 feet with 2 turns activity    Assist    Wheelchair 50 feet with 2 turns activity did not occur: N/A       Wheelchair 150 feet activity     Assist  Wheelchair 150 feet activity did not occur: N/A       Blood pressure (!) 130/95, pulse 72, temperature 98 F (36.7 C), temperature source Oral, resp. rate 18, height 5\' 7"  (1.702 m), weight 85.5 kg, SpO2 98 %.  Medical Problem List and Plan: 1.  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 Ranell Patrick in the office for transitional care encounter in 1-2 weeks.  2.  Antithrombotics: -DVT/anticoagulation:  Pharmaceutical: Lovenox  -stopped lovenox             -antiplatelet therapy: ASA 3.  Vascular HA/Pain Management: Oxycodone changed to ultram prn. Added topamax to treat HA.   Controlled on 3/27 4. Mood: LCSW to follow for evaluation and support.              -antipsychotic agents: N/A. Appreciate neuropsych evaluation.  5. Neuropsych: This patient is not capable of making decisions on her own behalf. 6. Skin/Wound Care: Routine pressure relief measures.  7. Fluids/Electrolytes/Nutrition: encourage po 8. HTN: Monitor BP   HCTZ, bidil stopped    Vitals:   10/29/19 0522 10/29/19 1205  BP: 140/86 (!) 130/95  Pulse: 78 72  Resp: 18 18  Temp: (!) 97.4 F (36.3 C) 98 F (36.7 C)  SpO2: 100% 98%       Coreg 6.25mg  bid, norvasc 10mg  daily and lisinopril 10mg  daily  Blood pressure relatively controlled on 3/27  Orthostatics negative on 3/27 9. OA bilateral knees: continue Voltaren gel to knees qid. Avoid narcotics due to ongoing polysubstance abuse.  10. AKI:   Creatinine improved on 3/26, BUN elevated   -follow  up as outpt 11. Prediabetes: Hgb A1c- 6.2. Will monitor BS ac/hs and have RD educate on CM diet.    Slightly labile, but relatively controlled on 3/27 12. Nausea: scopolamine patch, prn compazine.   Improved 13. Dizziness: scheduled meclizine--change to PRN  See #12 14. qTC prlonged on EKG. Zofran d/ced.  15.  Sleep disturbance  Improved with melatonin 6 mg QHS.       LOS: 23 days A FACE TO FACE EVALUATION WAS PERFORMED  Dionicia Cerritos Lorie Phenix 10/29/2019, 1:25 PM

## 2019-10-29 NOTE — Plan of Care (Signed)
  Problem: Consults Goal: RH STROKE PATIENT EDUCATION Description: See Patient Education module for education specifics  10/29/2019 1952 by Adria Devon, LPN Outcome: Completed/Met 10/29/2019 1952 by Ellison Carwin A, LPN Outcome: Progressing Goal: Nutrition Consult-if indicated 10/29/2019 1952 by Adria Devon, LPN Outcome: Completed/Met 10/29/2019 1952 by Adria Devon, LPN Outcome: Progressing Goal: Diabetes Guidelines if Diabetic/Glucose > 140 Description: If diabetic or lab glucose is > 140 mg/dl - Initiate Diabetes/Hyperglycemia Guidelines & Document Interventions  10/29/2019 1952 by Adria Devon, LPN Outcome: Completed/Met 10/29/2019 1952 by Adria Devon, LPN Outcome: Progressing   Problem: RH BOWEL ELIMINATION Goal: RH STG MANAGE BOWEL WITH ASSISTANCE Description: STG Manage Bowel with  moderate Assistance. 10/29/2019 1952 by Adria Devon, LPN Outcome: Completed/Met 10/29/2019 1952 by Adria Devon, LPN Outcome: Progressing Goal: RH STG MANAGE BOWEL W/MEDICATION W/ASSISTANCE Description: STG Manage Bowel with Medication with moderate Assistance. 10/29/2019 1952 by Adria Devon, LPN Outcome: Completed/Met 10/29/2019 1952 by Adria Devon, LPN Outcome: Progressing   Problem: RH SKIN INTEGRITY Goal: RH STG SKIN FREE OF INFECTION/BREAKDOWN Description: Skin free from infection entire stay on rehab 10/29/2019 1952 by Adria Devon, LPN Outcome: Completed/Met 10/29/2019 1952 by Adria Devon, LPN Outcome: Progressing Goal: RH STG ABLE TO PERFORM INCISION/WOUND CARE W/ASSISTANCE Description: STG Able To Perform Incision/Wound Care With Assistance. 10/29/2019 1952 by Adria Devon, LPN Outcome: Completed/Met 10/29/2019 1952 by Adria Devon, LPN Outcome: Progressing   Problem: RH SAFETY Goal: RH STG ADHERE TO SAFETY PRECAUTIONS W/ASSISTANCE/DEVICE Description: STG Adhere to Safety Precautions With  moderate  Assistance/Device. 10/29/2019 1952 by Adria Devon, LPN Outcome: Completed/Met 10/29/2019 1952 by Adria Devon, LPN Outcome: Progressing   Problem: RH PAIN MANAGEMENT Goal: RH STG PAIN MANAGED AT OR BELOW PT'S PAIN GOAL Description: Pain less than 2 10/29/2019 1952 by Adria Devon, LPN Outcome: Completed/Met 10/29/2019 1952 by Adria Devon, LPN Outcome: Progressing   Problem: RH KNOWLEDGE DEFICIT Goal: RH STG INCREASE KNOWLEDGE OF HYPERTENSION Description: Moderate assistance 10/29/2019 1952 by Adria Devon, LPN Outcome: Completed/Met 10/29/2019 1952 by Adria Devon, LPN Outcome: Progressing

## 2019-10-29 NOTE — Progress Notes (Signed)
Physical Therapy Discharge Summary  Patient Details  Name: Alison Whitehead MRN: [id number] Date of Birth: Apr 20, 1951  Patient has met 11 of 11 long term goals due to improved activity tolerance, improved balance, improved postural control, improved attention, improved awareness and improved coordination.  Patient to discharge at an ambulatory level Supervision, mod/i for transfers.  Pt and family have been educated on need for 24/7 supervision 2/2 decreased safety awareness and functional memory. Pt's insurance no longer covering CIR as of today's date and pt will discharge home with intermittent supervision from family. Pt's family is aware and understands the recommendation for 24/7 supervision to maximize safety within the home and are working to provide this between family and friends.   Reasons goals not met: n/a  Recommendation:  Patient will benefit from ongoing skilled PT services in home health setting to continue to advance safe functional mobility, address ongoing impairments in functional balance, endurance, global strength, and safety awareness/functional memory, and minimize fall risk.  Equipment: RW  Reasons for discharge: treatment goals met and discharge from hospital  Patient/family agrees with progress made and goals achieved: Yes  PT Discharge Precautions/Restrictions Precautions Precautions: Fall;Other (comment) Precaution Comments: watch BP (MD parameters <140/80 Restrictions Weight Bearing Restrictions: No Vital Signs Therapy Vitals Temp: (!) 97.4 F (36.3 C) Temp Source: Oral Pulse Rate: 78 Resp: 18 BP: 140/86 Patient Position (if appropriate): Lying Oxygen Therapy SpO2: 100 % O2 Device: Room Air Pain Pain Assessment Pain Scale: 0-10 Pain Score: 6  Pain Type: Acute pain Pain Location: Head Pain Orientation: Mid Pain Descriptors / Indicators: Headache Pain Onset: On-going Patients Stated Pain Goal: 0 Pain Intervention(s): Medication (See  eMAR) Multiple Pain Sites: No Vision/Perception  Perception Perception: Within Functional Limits Praxis Praxis: Intact  Cognition Overall Cognitive Status: Impaired/Different from baseline Arousal/Alertness: Awake/alert Orientation Level: Oriented X4 Attention: Selective Focused Attention: Appears intact Sustained Attention: Appears intact Selective Attention: Impaired Selective Attention Impairment: Functional complex Memory: Impaired Memory Impairment: Retrieval deficit;Decreased short term memory Decreased Short Term Memory: Functional complex Awareness: Impaired Awareness Impairment: Anticipatory impairment Problem Solving: Impaired Problem Solving Impairment: Functional complex Safety/Judgment: Impaired Sensation Sensation Light Touch: Appears Intact Coordination Gross Motor Movements are Fluid and Coordinated: Yes Motor  Motor Motor: Within Functional Limits Motor - Discharge Observations: Generalized weakness  Mobility Bed Mobility Bed Mobility: Supine to Sit;Sit to Supine;Rolling Left;Rolling Right Rolling Right: Independent Rolling Left: Independent Supine to Sit: Independent Sit to Supine: Independent Transfers Transfers: Sit to Stand;Stand to Sit;Stand Pivot Transfers Sit to Stand: Independent with assistive device Stand to Sit: Independent with assistive device Stand Pivot Transfers: Independent with assistive device Transfer (Assistive device): Rolling walker Locomotion  Gait Ambulation: Yes Gait Assistance: Supervision/Verbal cueing Gait Distance (Feet): 150 Feet Assistive device: Rolling walker Gait Assistance Details: Verbal cues for precautions/safety Gait Gait: Yes Gait Pattern: Impaired Gait Pattern: Poor foot clearance - left;Poor foot clearance - right;Trunk flexed;Shuffle Gait velocity: decreased Stairs / Additional Locomotion Stairs: Yes Stairs Assistance: Supervision/Verbal cueing Stair Management Technique: Two rails Number of  Stairs: 12 Height of Stairs: 6 Ramp: Supervision/Verbal cueing Wheelchair Mobility Wheelchair Mobility: No  Trunk/Postural Assessment  Cervical Assessment Cervical Assessment: Exceptions to WFL(forward head) Thoracic Assessment Thoracic Assessment: Exceptions to WFL(rounded shoulders) Lumbar Assessment Lumbar Assessment: Exceptions to WFL(increased flexion) Postural Control Postural Control: Within Functional Limits  Balance Balance Balance Assessed: Yes Static Sitting Balance Static Sitting - Level of Assistance: 7: Independent Dynamic Sitting Balance Dynamic Sitting - Level of Assistance: 7: Independent Static Standing Balance Static Standing - Level  of Assistance: 6: Modified independent (Device/Increase time) Dynamic Standing Balance Dynamic Standing - Level of Assistance: 5: Stand by assistance Extremity Assessment  RLE Assessment RLE Assessment: Exceptions to [facility] Passive Range of Motion (PROM) Comments: WNL General Strength Comments: Hip musculature 4/5, knee and ankle 5/5 LLE Assessment LLE Assessment: Exceptions to [facility] General Strength Comments: Hip musculature 4/5, knee and ankle 5/5    Quandra Fedorchak K Sharica Roedel 10/29/2019, 7:54 AM

## 2019-10-29 NOTE — Progress Notes (Signed)
Speech Language Pathology Discharge Summary  Patient Details  Name: Alison Whitehead MRN: [id number] Date of Birth: 1950-10-26  Patient has met 5 of 5 long term goals.  Patient to discharge at [facility] level.   Reasons goals not met: N/A   Clinical Impression/Discharge Summary: Patient has made functional gains and has met 5 of 5 LTGs this admission. Currently, patient requires overall supervision-Min A verbal cues to complete functional and mildly complex tasks safely in regards to problem solving, attention, awareness and recall with use of compensatory strategies. Patient's insurance is no longer covering CIR as of today's date and patient will discharge home with intermittent supervision from family. Patient and family education is complete and patient's family is aware and understands the recommendation for 24 hour supervision to maximize safety within the home and are working to provide this between family and friends. Patient would benefit from f/u SLP services to maximize her cognitive functioning and overall functional independence in order to reduce caregiver burden.   Care Partner:  Type of Caregiver Assistance: Physical;Cognitive  Recommendation:  Home Health SLP;24 hour supervision/assistance  Rationale for SLP Follow Up: Maximize cognitive function and independence   Equipment: N/A   Reasons for discharge: Discharged from hospital;Treatment goals met   Patient/Family Agrees with Progress Made and Goals Achieved: Yes    Sheenah Dimitroff, Angus 10/29/2019, 6:38 AM

## 2019-10-31 NOTE — Progress Notes (Signed)
Social Work Discharge Note   The overall goal for the admission was met for:   Discharge location: Yes. D/c to home with intermittent support from family. Check-ins primarily in the evening, with phone calls periodically throughout the day.   Length of Stay: Yes. 22 days.   Discharge activity level: Yes. Mod I (physical); 24/7 care due to cognition.  Home/community participation: Yes. Limited.  Services provided included: MD, RD, PT, OT, SLP, RN, CM, TR, Pharmacy, Neuropsych and SW  Financial Services: Private Insurance: [employer] Medicare  Follow-up services arranged: Home Health: [facility] PT/OT/ST/SW and DME: [place] for RW and TTB  Comments (or additional information): contact pt dtr carolyn # [phone]  Patient/Family verbalized understanding of follow-up arrangements: Yes  Individual responsible for coordination of the follow-up plan: Pt will have assistance from her dtr and sister to coordinate care needs.   Confirmed correct DME delivered: Rana Snare 10/31/2019    Loralee Pacas, MSW, [place] Office: [phone] Cell: [phone] Fax: [phone]

## 2019-11-01 ENCOUNTER — Telehealth: Payer: Self-pay | Admitting: Registered Nurse

## 2019-11-01 NOTE — Telephone Encounter (Signed)
Transitional Care call  Patient name: Alison Whitehead  DOB: 1951-03-02 1. Are you/is patient experiencing any problems since coming home? No a. Are there any questions regarding any aspect of care? No 2. Are there any questions regarding medications administration/dosing? No a. Are meds being taken as prescribed? Yes b. "Patient should review meds with caller to confirm" Medication List Reviewed. 3. Have there been any falls? No 4. Has Home Health been to the house and/or have they contacted you? Yes, [facility]  a. If not, have you tried to contact them? NA b. Can we help you contact them? NA 5. Are bowels and bladder emptying properly? Yes a. Are there any unexpected incontinence issues? No b. If applicable, is patient following bowel/bladder programs? NA 6. Any fevers, problems with breathing, unexpected pain? No 7. Are there any skin problems or new areas of breakdown? No 8. Has the patient/family member arranged specialty MD follow up (ie cardiology/neurology/renal/surgical/etc.)?  Alison Whitehead was instructed to call [facility] to schedule HFU appointment, she verbalizes understanding.  a. Can we help arrange? No 9. Does the patient need any other services or support that we can help arrange? No 10. Are caregivers following through as expected in assisting the patient? ( 11. Has the patient quit smoking, drinking alcohol, or using drugs as recommended? Alison Whitehead denies smoking, drinking alcohol or using illicit drugs.   Appointment date/time 11/09/2019  arrival time 1:20 for 1:40 appointment with Dr Naaman Plummer. At [place]

## 2019-11-01 NOTE — Progress Notes (Signed)
Occupational Therapy Discharge Summary Late Entry  Patient Details  Name: Alison Whitehead MRN: [id number] Date of Birth: 01-28-1951   Patient has met 9 of 9 long term goals due to improved activity tolerance, improved balance, ability to compensate for deficits, improved attention, improved awareness and improved coordination.  Patient to discharge at overall Supervision level with family education on need for 24 hour supervision secondary to decrease safety awareness and impulsivity although improved from initial eval.   Reasons goals not met: N/A  Recommendation:  Patient will benefit from ongoing skilled OT services in home health setting to continue to advance functional skills in the area of BADL, iADL and Reduce care partner burden.  Equipment: BSC, TTB.  Reasons for discharge: treatment goals met  Patient/family agrees with progress made and goals achieved: Yes  OT Discharge Precautions/Restrictions  Precautions Precautions: Fall;Other (comment) Precaution Comments: watch BP (MD parameters <140/80 Restrictions Weight Bearing Restrictions: No ADL ADL Eating: Independent Grooming: Modified independent, Supervision/safety Where Assessed-Grooming: Standing at sink Upper Body Bathing: Modified independent Where Assessed-Upper Body Bathing: Shower Lower Body Bathing: Supervision/safety Where Assessed-Lower Body Bathing: Shower Upper Body Dressing: Modified independent (Device) Where Assessed-Upper Body Dressing: Edge of bed Lower Body Dressing: Supervision/safety Where Assessed-Lower Body Dressing: Edge of bed Toileting: Supervision/safety Where Assessed-Toileting: Glass blower/designer: Distant supervision Armed forces technical officer Method: Counselling psychologist: Engineer, technical sales Transfer: Close supervison Clinical cytogeneticist Method: Librarian, academic: Shower seat with back Vision Baseline Vision/History: Wears glasses Wears  Glasses: At all times Perception  Perception: Within Functional Limits Praxis Praxis: Intact Cognition Overall Cognitive Status: Impaired/Different from baseline Arousal/Alertness: Awake/alert Orientation Level: Oriented X4 Attention: Alternating Focused Attention: Appears intact Sustained Attention: Appears intact Alternating Attention: Impaired Alternating Attention Impairment: Functional basic;Functional complex Memory: Impaired Memory Impairment: Retrieval deficit;Storage deficit;Decreased short term memory Decreased Short Term Memory: Verbal basic;Functional basic Awareness Impairment: Intellectual impairment Problem Solving: Impaired Problem Solving Impairment: Verbal basic;Functional basic Executive Function: Reasoning;Decision Making Reasoning: Impaired Reasoning Impairment: Functional basic;Verbal basic Decision Making: Appears intact Self Monitoring: Impaired Self Monitoring Impairment: Verbal basic Behaviors: Impulsive(Greatly improved since initial evaluation.) Safety/Judgment: Impaired Comments: Patient requires Min cueing for safety awareness with functional mobility, transfers, and self-care tasks in standing. Sensation Sensation Light Touch: Appears Intact Coordination Gross Motor Movements are Fluid and Coordinated: Yes Motor  Motor Motor: Within Functional Limits Motor - Discharge Observations: Generalized weakness Mobility  Bed Mobility Bed Mobility: Supine to Sit;Sit to Supine;Rolling Left;Rolling Right Rolling Right: Independent Rolling Left: Independent Supine to Sit: Independent Sit to Supine: Independent Scooting to HOB: Supervision/Verbal Cueing Transfers Sit to Stand: Supervision/Verbal cueing Stand to Sit: Supervision/Verbal cueing  Trunk/Postural Assessment  Cervical Assessment Cervical Assessment: Exceptions to WFL(Forward head) Thoracic Assessment Thoracic Assessment: Exceptions to WFL(Rounded shoulders) Lumbar Assessment Lumbar  Assessment: Exceptions to [facility] Postural Control Postural Control: Deficits on evaluation(Delayed)  Balance Balance Balance Assessed: Yes Static Sitting Balance Static Sitting - Level of Assistance: 7: Independent Dynamic Sitting Balance Dynamic Sitting - Level of Assistance: 7: Independent Static Standing Balance Static Standing - Level of Assistance: 5: Stand by assistance Dynamic Standing Balance Dynamic Standing - Level of Assistance: 5: Stand by assistance Extremity/Trunk Assessment RUE Assessment RUE Assessment: Within Functional Limits Passive Range of Motion (PROM) Comments: WFL Active Range of Motion (AROM) Comments: WFL although limited by pain 2/2 arthritis General Strength Comments: DNT LUE Assessment LUE Assessment: Within Functional Limits Passive Range of Motion (PROM) Comments: WFL Active Range of Motion (AROM) Comments: WFL although limited by pain 2/2 arthritis General Strength  Comments: DNT   Alison Whitehead 11/01/2019, 8:54 AM

## 2019-11-09 ENCOUNTER — Encounter: Payer: Medicare HMO | Attending: Physical Medicine & Rehabilitation | Admitting: Physical Medicine & Rehabilitation

## 2019-11-09 ENCOUNTER — Other Ambulatory Visit: Payer: Self-pay

## 2019-11-09 ENCOUNTER — Encounter: Payer: Self-pay | Admitting: Physical Medicine & Rehabilitation

## 2019-11-09 VITALS — BP 142/101 | HR 74 | Temp 97.7°F | Ht 66.0 in | Wt 193.0 lb

## 2019-11-09 DIAGNOSIS — M1711 Unilateral primary osteoarthritis, right knee: Secondary | ICD-10-CM

## 2019-11-09 DIAGNOSIS — I6350 Cerebral infarction due to unspecified occlusion or stenosis of unspecified cerebral artery: Secondary | ICD-10-CM

## 2019-11-09 DIAGNOSIS — M1712 Unilateral primary osteoarthritis, left knee: Secondary | ICD-10-CM

## 2019-11-09 DIAGNOSIS — I10 Essential (primary) hypertension: Secondary | ICD-10-CM | POA: Diagnosis not present

## 2019-11-09 NOTE — Progress Notes (Signed)
Subjective:    Patient ID: Alison Whitehead, female    DOB: 09-10-1950, 69 y.o.   MRN: [id number]  HPI   Alison Whitehead is here in follow up of her right pontine infarct. She has gotten home which has made her very happy.  Family helps keep an eye on her including her daughter who is with her today.  She has an aid who helps with bathing and housework. HH is coming out to the house. She is sleeping well.  Her mood has been positive. Her appetite is excellent. Bowels and bladder are continent.   She is still having headaches but only every 2-3 days. She is on topamax for prophylaxis but uses tylenol for breakthrough symptoms which works for her.  She has noticed little opacity in her right eye.  She asked me if her stroke could be affecting her vision in that eye.  She does see an eye doctor has been told she has cataracts.  Her knees remain painful but the voltaren gel helps. She also uses it on her knees. She has a pain doctor who provides her hydrocodone.   She is on asa for cva prophylaxis.   I asked her daughter how she felt she was doing and overall she was really happy but agreed that she still can be impulsive.   Pain Inventory Average Pain 6 Pain Right Now 5 My pain is sharp, stabbing and aching  In the last 24 hours, has pain interfered with the following? General activity 7 Relation with others 7 Enjoyment of life 7 What TIME of day is your pain at its worst? night Sleep (in general) Fair  Pain is worse with: walking, inactivity and some activites Pain improves with: rest and medication Relief from Meds: 5  Mobility walk with assistance use a walker how many minutes can you walk? 10 do you drive?  no needs help with transfers  Function I need assistance with the following:  bathing and household duties  Neuro/Psych trouble walking spasms  Prior Studies transitional  Physicians involved in your care transitional   Family History  Problem Relation  Age of Onset  . Breast cancer Maternal Aunt   . Diabetes Mother   . Hypertension Mother   . Glaucoma Mother   . Diabetes Sister   . Hypertension Sister   . Cancer Sister    Social History   Socioeconomic History  . Marital status: Widowed    Spouse name: Not on file  . Number of children: Not on file  . Years of education: Not on file  . Highest education level: Not on file  Occupational History  . Not on file  Tobacco Use  . Smoking status: Current Every Day Smoker  . Smokeless tobacco: Never Used  Substance and Sexual Activity  . Alcohol use: No  . Drug use: No  . Sexual activity: Not Currently    Birth control/protection: None  Other Topics Concern  . Not on file  Social History Narrative  . Not on file   Social Determinants of Health   Financial Resource Strain:   . Difficulty of Paying Living Expenses:   Food Insecurity:   . Worried About Charity fundraiser in the Last Year:   . Arboriculturist in the Last Year:   Transportation Needs:   . Film/video editor (Medical):   Marland Kitchen Lack of Transportation (Non-Medical):   Physical Activity:   . Days of Exercise per Week:   . Minutes  of Exercise per Session:   Stress:   . Feeling of Stress :   Social Connections:   . Frequency of Communication with Friends and Family:   . Frequency of Social Gatherings with Friends and Family:   . Attends Religious Services:   . Active Member of Clubs or Organizations:   . Attends Archivist Meetings:   Marland Kitchen Marital Status:    Past Surgical History:  Procedure Laterality Date  . SHOULDER SURGERY     Past Medical History:  Diagnosis Date  . Arthritis   . DDD (degenerative disc disease)   . Degenerative disc disease, lumbar   . Hypertension   . IBS (irritable bowel syndrome)   . Osteopenia    BP (!) 142/101   Pulse 74   Temp 97.7 F (36.5 C)   Ht 5\' 6"  (1.676 m)   Wt 87.5 kg   SpO2 95%   BMI 31.15 kg/m   Opioid Risk Score:   Fall Risk Score:  `1   Depression screen PHQ 2/9  No flowsheet data found.  Review of Systems  Constitutional: Negative.   HENT: Negative.   Eyes: Negative.   Cardiovascular: Negative.   Gastrointestinal: Negative.   Endocrine: Negative.   Genitourinary: Negative.   Musculoskeletal: Positive for back pain and gait problem.       Spasms   Skin: Negative.   Allergic/Immunologic: Negative.   Hematological: Negative.   Psychiatric/Behavioral: Negative.   All other systems reviewed and are negative.      Objective:   Physical Exam   General: Alert and oriented x 3, No apparent distress HEENT: Head is normocephalic, atraumatic, PERRLA, EOMI, sclera anicteric, oral mucosa pink and moist, dentition intact, ext ear canals clear,  Neck: Supple without JVD or lymphadenopathy Heart: Reg rate and rhythm. No murmurs rubs or gallops Chest: CTA bilaterally without wheezes, rales, or rhonchi; no distress Abdomen: Soft, non-tender, non-distended, bowel sounds positive. Extremities: No clubbing, cyanosis, or edema. Pulses are 2+ Skin: Clean and intact without signs of breakdown Neuro: Pt is cognitively appropriate with normal insight, memory, and awareness. Cranial nerves 2-12 are intact. Sensory exam is normal. Reflexes are 2+ in all 4's. Fine motor coordination is intact. No tremors. Motor function is grossly 5/5 RUE and RLE 4+ to 5/5. Mild pronator drift on left.  Able to spell the word world forward and backwards.  Aware of current events. Musculoskeletal: Full ROM, No pain with AROM or PROM in the neck, trunk, or extremities. Posture appropriate Psych: Pt's affect is appropriate. Pt is cooperative.  Somewhat impulsive and distracted        Assessment & Plan:  Medical Problem List and Plan: 1.  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  Consider transition to outpatient therapies once home  health therapy winds down  -Discussed the importance of slowing down and taking her time in general especially moving forward. 2.  Persistent HA:  -topamax for prophylaxis  -needs eye exam. ?cataracts            -3/15 Headache controlled with topamax 50mg  bid.  Advised her that these should improve over time.  If not we can make some changes to her regimen.  I  4. Mood: is very positve5. Neuropsych: This patient is not capable of making decisions on her own behalf. 6. Skin/Wound Care: Routine pressure  relief measures.  7. Fluids/Electrolytes/Nutrition: encourage po 8. HTN: elevated today  -asked her to buy a BP cuff and check at home  -we can increase medication if needed  -continue lisinopril, coreg, norvasc 9. OA bilateral knees: continue Voltaren gel to knees  -knee sleeves are fine  -Work on knee strengthening exercises at home 10. Prediabetes: check blood glucose per primary  -watch sweets 12. Dizziness: AM acclimation 14. qTC prlonged on EKG today. Zofran d/ced.  15/ Insomnia             Improved with melatonin 6 mg QHS.   Thirty minutes of face to face patient care time were spent during this visit. All questions were encouraged and answered. Follow up with me in 2 months.

## 2019-11-09 NOTE — Patient Instructions (Addendum)
PLEASE FEEL FREE TO CALL OUR OFFICE WITH ANY PROBLEMS OR QUESTIONS [phone])   TAKE TIME TO ACCLIMATE IN THE MORNING BEFORE YOU GET UP AND WALK.   TAKE YOUR TIME!!!!!!!!!!!!!!!!!!

## 2019-11-15 ENCOUNTER — Telehealth: Payer: Self-pay

## 2019-11-15 NOTE — Telephone Encounter (Signed)
Alison Whitehead, PT called Patient BP is elevated last reading 160/101. No PCP yet. Patient is seeing pain doctor next week at [facility]. Sent doctor at [facility] a message well.

## 2019-11-16 MED ORDER — LISINOPRIL 20 MG PO TABS: 20.0000 mg | ORAL_TABLET | Freq: Every day | ORAL | 2 refills | Status: DC

## 2019-11-16 NOTE — Telephone Encounter (Signed)
Lisinopril increased to 20mg  daily. Please have [place] check BMET Friday.  thanks

## 2019-11-17 NOTE — Telephone Encounter (Signed)
Alison Whitehead and patient left detailed message about increasing medication and bloodwork order.

## 2019-12-13 ENCOUNTER — Ambulatory Visit: Payer: Medicare HMO | Admitting: Diagnostic Neuroimaging

## 2019-12-13 ENCOUNTER — Other Ambulatory Visit: Payer: Self-pay

## 2019-12-13 ENCOUNTER — Encounter: Payer: Self-pay | Admitting: Diagnostic Neuroimaging

## 2019-12-13 VITALS — BP 140/92 | HR 73 | Temp 97.2°F | Ht 66.0 in | Wt 196.0 lb

## 2019-12-13 DIAGNOSIS — I6350 Cerebral infarction due to unspecified occlusion or stenosis of unspecified cerebral artery: Secondary | ICD-10-CM | POA: Diagnosis not present

## 2019-12-13 MED ORDER — ASPIRIN EC 81 MG PO TBEC: 81.0000 mg | DELAYED_RELEASE_TABLET | Freq: Every day | ORAL | Status: AC

## 2019-12-13 NOTE — Progress Notes (Signed)
[facility]  PATIENT: Alison Whitehead DOB: 25-Feb-1951  REFERRING CLINICIAN: No ref. provider found HISTORY FROM: Patient and daughter REASON FOR VISIT: New consult   HISTORICAL  CHIEF COMPLAINT:  Chief Complaint  Patient presents with  . Stroke    rm [place] dgtr- [facility] Sauer  "doing well,  still exercising"    HISTORY OF PRESENT ILLNESS:   69 year old female here for evaluation of hospital stroke follow-up.  History of hypertension, hypercholesterolemia, cocaine abuse.  Patient admitted to hospital in February 2021 for sudden onset dizziness and nausea, with MRI showing subacute infarction of right posterior lateral pons.  Patient was mated for work-up and discharged to inpatient rehabilitation.  Now patient living back at home.  Patient is doing well now.  She is back to her baseline.   REVIEW OF SYSTEMS: Full 14 system review of systems performed and negative with exception of: As per HPI.  ALLERGIES: Allergies  Allergen Reactions  . Penicillins Itching    HOME MEDICATIONS: Outpatient Medications Prior to Visit  Medication Sig Dispense Refill  . acetaminophen (TYLENOL) 325 MG tablet Take 1-2 tablets (325-650 mg total) by mouth every 4 (four) hours as needed for mild pain.    Marland Kitchen amLODipine (NORVASC) 10 MG tablet Take 1 tablet (10 mg total) by mouth daily. 30 tablet 0  . aspirin 325 MG tablet Take 1 tablet (325 mg total) by mouth daily. 100 tablet 0  . atorvastatin (LIPITOR) 40 MG tablet Take 1 tablet (40 mg total) by mouth daily at 6 PM. 30 tablet 0  . carvedilol (COREG) 6.25 MG tablet Take 1 tablet (6.25 mg total) by mouth 2 (two) times daily with a meal. 60 tablet 0  . cetirizine (ZYRTEC) 10 MG tablet Take 1 tablet (10 mg total) by mouth at bedtime. 30 tablet 0  . diclofenac sodium (VOLTAREN) 1 % GEL Apply 2 g topically 4 (four) times daily. 350 g 1  . HYDROcodone-acetaminophen (NORCO) 10-325 MG tablet Take 1 tablet by mouth every 8  (eight) hours as needed.    Marland Kitchen lisinopril (ZESTRIL) 20 MG tablet Take 1 tablet (20 mg total) by mouth daily. 30 tablet 2  . meclizine (ANTIVERT) 12.5 MG tablet Take 1 tablet (12.5 mg total) by mouth 3 (three) times daily as needed for dizziness. 30 tablet 0  . melatonin 3 MG TABS tablet Take 2 tablets (6 mg total) by mouth at bedtime. 60 tablet 0  . Menthol-Methyl Salicylate (MUSCLE RUB) 10-15 % CREA Apply 1 application topically 2 (two) times daily as needed for muscle pain. To bilateral knees  0  . Multiple Vitamin (MULTIVITAMIN WITH MINERALS) TABS tablet Take 1 tablet by mouth daily.    . naphazoline-glycerin (CLEAR EYES REDNESS) 0.012-0.2 % SOLN Place 1-2 drops into both eyes 4 (four) times daily as needed for eye irritation.  0  . polyethylene glycol (MIRALAX / GLYCOLAX) 17 g packet Take 17 g by mouth daily. 30 each 0  . scopolamine (TRANSDERM-SCOP) 1 MG/3DAYS Place 1 patch (1.5 mg total) onto the skin every 3 (three) days. Start on Sunday 10 patch 12  . senna-docusate (SENOKOT-S) 8.6-50 MG tablet Take 2 tablets by mouth at bedtime. 60 tablet 0  . topiramate (TOPAMAX) 50 MG tablet Take 1 tablet (50 mg total) by mouth 2 (two) times daily. 60 tablet 0   No facility-administered medications prior to visit.    PAST MEDICAL HISTORY: Past Medical History:  Diagnosis Date  . Arthritis   . DDD (  degenerative disc disease)   . Degenerative disc disease, lumbar   . Hypertension   . IBS (irritable bowel syndrome)   . Osteopenia   . Stroke [facility]) 09/2019    PAST SURGICAL HISTORY: Past Surgical History:  Procedure Laterality Date  . SHOULDER SURGERY Left    lipoma    FAMILY HISTORY: Family History  Problem Relation Age of Onset  . Breast cancer Maternal Aunt   . Diabetes Mother   . Hypertension Mother   . Glaucoma Mother   . Other Father        accident  . Diabetes Sister   . Hypertension Sister   . Cancer Sister     SOCIAL HISTORY: Social History   Socioeconomic History  .  Marital status: Widowed    Spouse name: Not on file  . Number of children: 2  . Years of education: Not on file  . Highest education level: Some college, no degree  Occupational History    Comment: retired  Tobacco Use  . Smoking status: Former Smoker    Quit date: 10/13/2019    Years since quitting: 0.1  . Smokeless tobacco: Never Used  Substance and Sexual Activity  . Alcohol use: Never  . Drug use: Never  . Sexual activity: Not Currently    Birth control/protection: None  Other Topics Concern  . Not on file  Social History Narrative   12/13/19 Lives alone   No caffeine   Social Determinants of Health   Financial Resource Strain:   . Difficulty of Paying Living Expenses:   Food Insecurity:   . Worried About Charity fundraiser in the Last Year:   . Arboriculturist in the Last Year:   Transportation Needs:   . Film/video editor (Medical):   Marland Kitchen Lack of Transportation (Non-Medical):   Physical Activity:   . Days of Exercise per Week:   . Minutes of Exercise per Session:   Stress:   . Feeling of Stress :   Social Connections:   . Frequency of Communication with Friends and Family:   . Frequency of Social Gatherings with Friends and Family:   . Attends Religious Services:   . Active Member of Clubs or Organizations:   . Attends Archivist Meetings:   Marland Kitchen Marital Status:   Intimate Partner Violence:   . Fear of Current or Ex-Partner:   . Emotionally Abused:   Marland Kitchen Physically Abused:   . Sexually Abused:      PHYSICAL EXAM  GENERAL EXAM/CONSTITUTIONAL: Vitals:  Vitals:   12/13/19 1245  BP: (!) 140/92  Pulse: 73  Temp: (!) 97.2 F (36.2 C)  Weight: 196 lb (88.9 kg)  Height: 5\' 6"  (1.676 m)     Body mass index is 31.64 kg/m. Wt Readings from Last 3 Encounters:  12/13/19 196 lb (88.9 kg)  11/09/19 193 lb (87.5 kg)  10/13/19 188 lb 7.9 oz (85.5 kg)     Patient is in no distress; well developed, nourished and groomed; neck is supple   CARDIOVASCULAR:  Examination of carotid arteries is normal; no carotid bruits  Regular rate and rhythm, no murmurs  Examination of peripheral vascular system by observation and palpation is normal  EYES:  Ophthalmoscopic exam of optic discs and posterior segments is normal; no papilledema or hemorrhages  No exam data present  MUSCULOSKELETAL:  Gait, strength, tone, movements noted in Neurologic exam below  NEUROLOGIC: MENTAL STATUS:  No flowsheet data found.  awake, alert, oriented to  person, place and time  recent and remote memory intact  normal attention and concentration  language fluent, comprehension intact, naming intact  fund of knowledge appropriate  CRANIAL NERVE:   2nd - no papilledema on fundoscopic exam  2nd, 3rd, 4th, 6th - pupils equal and reactive to light, visual fields full to confrontation, extraocular muscles intact, no nystagmus  5th - facial sensation symmetric  7th - facial strength symmetric  8th - hearing intact  9th - palate elevates symmetrically, uvula midline  11th - shoulder shrug symmetric  12th - tongue protrusion midline  MOTOR:   normal bulk and tone, full strength in the BUE, BLE  SENSORY:   normal and symmetric to light touch, temperature, vibration  COORDINATION:   finger-nose-finger, fine finger movements normal  REFLEXES:   deep tendon reflexes present and symmetric  GAIT/STATION:   narrow based gait; SLOW AND CAUTIOUS     DIAGNOSTIC DATA (LABS, IMAGING, TESTING) - I reviewed patient records, labs, notes, testing and imaging myself where available.  Lab Results  Component Value Date   WBC 5.4 10/24/2019   HGB 11.1 (L) 10/24/2019   HCT 35.3 (L) 10/24/2019   MCV 88.3 10/24/2019   PLT 202 10/24/2019      Component Value Date/Time   NA 141 10/28/2019 0622   K 4.0 10/28/2019 0622   CL 109 10/28/2019 0622   CO2 23 10/28/2019 0622   GLUCOSE 110 (H) 10/28/2019 0622   BUN 27 (H) 10/28/2019 0622    CREATININE 0.97 10/28/2019 0622   CALCIUM 8.8 (L) 10/28/2019 0622   PROT 7.0 10/07/2019 0614   ALBUMIN 3.4 (L) 10/07/2019 0614   AST 13 (L) 10/07/2019 0614   ALT 11 10/07/2019 0614   ALKPHOS 61 10/07/2019 0614   BILITOT 0.7 10/07/2019 0614   GFRNONAA >60 10/28/2019 0622   GFRAA >60 10/28/2019 0622   Lab Results  Component Value Date   CHOL 230 (H) 11/08/2007   HDL 69 11/08/2007   LDLCALC 135 (H) 11/08/2007   TRIG 129 11/08/2007   CHOLHDL 3.3 Ratio 11/08/2007   Lab Results  Component Value Date   HGBA1C 6.2 (H) 10/02/2019   No results found for: [id number] Lab Results  Component Value Date   TSH 1.431 10/01/2019    09/30/19 MRI brain  1. Findings consistent with acute/early subacute microhemorrhage or hemorrhagic lacunar infarct in the posterior right pons as described. 2. Advanced chronic small vessel ischemic disease. Multiple chronic lacunar infarcts in the bilateral basal ganglia and thalami.  3. Multiple supratentorial chronic microhemorrhages with a central predominance. 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 4. Trace left mastoid effusion  10/02/19 carotid u/s Right Carotid: Velocities in the right ICA are consistent with a 1-39%  stenosis.   Left Carotid: Velocities in the left ICA are consistent with a 1-39%  stenosis.   Vertebrals: Bilateral vertebral arteries demonstrate antegrade flow.   10/02/19 TTE 1. Left ventricular ejection fraction, by estimation, is 55 to 60%. The  left ventricle has normal function. The left ventricle has no regional  wall motion abnormalities. The left ventricular internal cavity size was  severely dilated. Left ventricular  diastolic parameters are consistent with Grade I diastolic dysfunction  (impaired relaxation). Elevated left ventricular end-diastolic pressure.  2. Right ventricular systolic function is normal. The right ventricular  size is normal. There is normal  pulmonary artery systolic pressure.  3. The mitral valve is normal in structure and function. Trivial mitral  valve regurgitation.  No evidence of mitral stenosis.  4. The aortic valve is tricuspid. Aortic valve regurgitation is trivial.  Mild to moderate aortic valve sclerosis/calcification is present, without  any evidence of aortic stenosis.  5. Aortic dilatation noted. 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  6. The inferior vena cava is normal in size with greater than 50%  respiratory variability, suggesting right atrial pressure of 3 mmHg.     ASSESSMENT AND PLAN  69 y.o. year old female here with:   Dx:  1. Right pontine stroke (HCC)     PLAN:  Small vessel stroke (right pons; Feb 2021) - recommend changing to aspirin 81mg  daily; continue BP control, statin  - avoid cigarettes or other illicit substances  Return for pending if symptoms worsen or fail to improve, return to PCP.    Penni Bombard, MD [id number], [id number] PM Certified in Neurology, Neurophysiology and Neuroimaging  [facility] [address] [place], [place] [address] [phone]

## 2019-12-13 NOTE — Patient Instructions (Signed)
Small vessel stroke - continue aspirin 81mg , BP control, statin  - avoid cigarettes or other illicit substances

## 2020-01-06 ENCOUNTER — Encounter: Payer: Self-pay | Admitting: Gastroenterology

## 2020-01-11 ENCOUNTER — Encounter: Payer: Self-pay | Admitting: Physical Medicine & Rehabilitation

## 2020-01-11 ENCOUNTER — Encounter: Payer: Medicare HMO | Attending: Physical Medicine & Rehabilitation | Admitting: Physical Medicine & Rehabilitation

## 2020-01-11 ENCOUNTER — Other Ambulatory Visit: Payer: Self-pay

## 2020-01-11 VITALS — BP 139/89 | HR 72 | Temp 97.5°F | Ht 66.0 in | Wt 197.0 lb

## 2020-01-11 DIAGNOSIS — I10 Essential (primary) hypertension: Secondary | ICD-10-CM | POA: Diagnosis present

## 2020-01-11 DIAGNOSIS — M75101 Unspecified rotator cuff tear or rupture of right shoulder, not specified as traumatic: Secondary | ICD-10-CM | POA: Insufficient documentation

## 2020-01-11 DIAGNOSIS — M1711 Unilateral primary osteoarthritis, right knee: Secondary | ICD-10-CM | POA: Diagnosis present

## 2020-01-11 DIAGNOSIS — I6350 Cerebral infarction due to unspecified occlusion or stenosis of unspecified cerebral artery: Secondary | ICD-10-CM

## 2020-01-11 DIAGNOSIS — M1712 Unilateral primary osteoarthritis, left knee: Secondary | ICD-10-CM | POA: Diagnosis not present

## 2020-01-11 DIAGNOSIS — M75102 Unspecified rotator cuff tear or rupture of left shoulder, not specified as traumatic: Secondary | ICD-10-CM | POA: Diagnosis not present

## 2020-01-11 NOTE — Progress Notes (Signed)
Subjective:    Patient ID: Alison Whitehead, female    DOB: 13-Mar-1951, 69 y.o.   MRN: [id number]  HPI  Alison Whitehead is back regarding her right pontine infarct.  She has completed home health therapies and has been getting around fairly well with her straight cane.  Her daughter still provides intermittent supervision for her.  She saw general surgery with concerns over a lipoma in her right shoulder.  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  Also her knees continue to give her problems, left more than right.  She has seen orthopedic surgery in the past for knee pain and had an injection in the left knee as recent as February of this year.  Otherwise her blood pressure has been much better controlled.  Her mood is positive.  She is anxious to get back to driving again.  She saw ophthalmology regarding her vision and had some testing done and is awaiting final results regarding these.  Her headaches are improving.  She continues on Topamax for prophylaxis.   Pain Inventory Average Pain 10 Pain Right Now 10 My pain is sharp  In the last 24 hours, has pain interfered with the following? General activity 0 Relation with others 0 Enjoyment of life 0 What TIME of day is your pain at its worst? night Sleep (in general) Good  Pain is worse with: walking, standing and some activites Pain improves with: rest and medication Relief from Meds: 7  Mobility walk with assistance use a walker how many minutes can you walk? 10 ability to climb steps?  no  Function I need assistance with the following:  bathing and household duties  Neuro/Psych trouble walking spasms  Prior Studies Any changes since last visit?  no  Physicians involved in your care Any changes since last visit?  no   Family History  Problem Relation Age of Onset  . Breast cancer Maternal Aunt   . Diabetes Mother   . Hypertension Mother   . Glaucoma Mother   . Other Father          accident  . Diabetes Sister   . Hypertension Sister   . Cancer Sister    Social History   Socioeconomic History  . Marital status: Widowed    Spouse name: Not on file  . Number of children: 2  . Years of education: Not on file  . Highest education level: Some college, no degree  Occupational History    Comment: retired  Tobacco Use  . Smoking status: Former Smoker    Quit date: 10/13/2019    Years since quitting: 0.2  . Smokeless tobacco: Never Used  Substance and Sexual Activity  . Alcohol use: Never  . Drug use: Never  . Sexual activity: Not Currently    Birth control/protection: None  Other Topics Concern  . Not on file  Social History Narrative   12/13/19 Lives alone   No caffeine   Social Determinants of Health   Financial Resource Strain:   . Difficulty of Paying Living Expenses:   Food Insecurity:   . Worried About Charity fundraiser in the Last Year:   . Arboriculturist in the Last Year:   Transportation Needs:   . Film/video editor (Medical):   Marland Kitchen Lack of Transportation (Non-Medical):   Physical Activity:   . Days of Exercise per Week:   . Minutes of Exercise per Session:   Stress:   .  Feeling of Stress :   Social Connections:   . Frequency of Communication with Friends and Family:   . Frequency of Social Gatherings with Friends and Family:   . Attends Religious Services:   . Active Member of Clubs or Organizations:   . Attends Archivist Meetings:   Marland Kitchen Marital Status:    Past Surgical History:  Procedure Laterality Date  . SHOULDER SURGERY Left    lipoma   Past Medical History:  Diagnosis Date  . Arthritis   . DDD (degenerative disc disease)   . Degenerative disc disease, lumbar   . Hypertension   . IBS (irritable bowel syndrome)   . Osteopenia   . Stroke ([place] [place]) 09/2019   There were no vitals taken for this visit.  Opioid Risk Score:   Fall Risk Score:  `1  Depression screen PHQ 2/9  No flowsheet data  found.  Review of Systems  Musculoskeletal: Positive for gait problem.  Neurological:       Spasms  All other systems reviewed and are negative.      Objective:   Physical Exam  General: No acute distress HEENT: EOMI, oral membranes moist Cards: reg rate  Chest: normal effort Abdomen: Soft, NT, ND Skin: dry, intact Extremities: no edema Neuro: Pt is cognitively appropriate with normal insight, memory, and awareness. Cranial nerves 2-12 are intact. Sensory exam is normal. Reflexes are 2+ in all 4's. Fine motor coordination is intact. No tremors. Motor function is grossly 5/5 RUE and RLE 4+ to 5/5. Mild pronator drift on left is minimal.    [name] is appropriate  musculoskeletal:  Mild crepitus in either knee.  Left knee with mild effusion.  She is antalgic with weightbearing on the left.  Right shoulder is notable for pain with impingement maneuver as well as external and internal rotation in general.  No pain with flexion or crossarm maneuver.  Mild posterior shoulder pain only.  No bicipital tendon pain. Psych:  She still is a little impulsive but overall this has improved.  She is very pleasant overall as always.           Assessment & Plan:  Medical Problem List and Plan: 1.  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  Post- stroke HA:             -Continue Topamax 50 mg twice daily for headaches.  Overall these appear improved             -Pending results of recent eye examination 3. HTN:  This is followed by cardiology and primary physician.  Overall it is improved 4.  Right rotator cuff tendinitis: Made referral to outpatient physical therapy to address right shoulder range of motion, scapulohumeral rhythm, strengthening and modalities.  Patient needs good home exercise program.  If no improvements in pain can consider injection 5. OA bilateral knees: continue  Voltaren gel to knees             -After informed consent and preparation of the skin with betadine and isopropyl alcohol, I injected 6mg  (1cc) of celestone and 4cc of 1% lidocaine into the left knee via anterolateral approach. Additionally, aspiration was performed prior to injection. The patient tolerated well, and no complications were encountered. Afterward the area was cleaned and dressed. Post- injection instructions were provided.  Patient was experiencing relief before  she left the office today   15 minutes was spent in examination and assessment with the patient and her daughter today.  I will see her back in about 3 months time.

## 2020-01-11 NOTE — Patient Instructions (Addendum)
PLEASE FEEL FREE TO CALL OUR OFFICE WITH ANY PROBLEMS OR QUESTIONS ([phone])  AFTER YOU'RE GIVEN CLEARANCE BY YOUR EYE DOCTOR:   RETURN TO DRIVING PLAN:  WITH THE SUPERVISION OF A LICENSED DRIVER, PLEASE DRIVE IN AN EMPTY PARKING LOT FOR AT LEAST 2-3 TRIALS TO TEST REACTION TIME, VISION, USE OF EQUIPMENT IN CAR, ETC.  IF SUCCESSFUL WITH THE PARKING LOT DRIVING, PROCEED TO SUPERVISED DRIVING TRIALS IN YOUR NEIGHBORHOOD STREETS AT LOW TRAFFIC TIMES TO TEST OBSERVATION TO TRAFFIC SIGNALS, REACTION TIME, ETC. PLEASE ATTEMPT AT LEAST 2-3 TRIALS IN YOUR NEIGHBORHOOD.  IF NEIGHBORHOOD DRIVING IS SUCCESSFUL, YOU MAY PROCEED TO DRIVING IN BUSIER AREAS IN YOUR COMMUNITY WITH SUPERVISION OF A LICENSED DRIVER. PLEASE ATTEMPT AT LEAST 4-5 TRIALS.  IF COMMUNITY DRIVING IS SUCCESSFUL, YOU MAY PROCEED TO DRIVING ALONE, DURING THE DAY TIME, IN NON-PEAK TRAFFIC TIMES. YOU SHOULD DRIVE NO FURTHER THAN 20 MINUTES IN ONE DIRECTION. PLEASE DO NOT DRIVE IF YOU FEEL FATIGUED OR UNDER THE INFLUENCE OF MEDICATION.

## 2020-01-25 ENCOUNTER — Telehealth: Payer: Self-pay

## 2020-01-25 NOTE — Telephone Encounter (Signed)
Patient called to report she has a headache with nausea & BP is 138/82.   She took a pain pill and feels better now.   Patient advised to see PCP or Urgent Care if symptoms worsen.   (Dr. Naaman Plummer  not in the office today.

## 2020-02-03 ENCOUNTER — Telehealth: Payer: Self-pay

## 2020-02-03 NOTE — Telephone Encounter (Signed)
Alison Whitehead would like to know when she can start cooking again?  Please advise.   Thank you.

## 2020-02-06 NOTE — Telephone Encounter (Signed)
She can do some simple cooking. I would recommend that she use a timer to remind her to turn off burners,oven though

## 2020-02-08 NOTE — Telephone Encounter (Signed)
I have notified Mrs Burack.

## 2020-02-15 ENCOUNTER — Other Ambulatory Visit: Payer: Self-pay | Admitting: Physical Medicine and Rehabilitation

## 2020-03-04 ENCOUNTER — Other Ambulatory Visit (HOSPITAL_COMMUNITY): Payer: Self-pay | Admitting: Physician Assistant

## 2020-03-14 ENCOUNTER — Ambulatory Visit (AMBULATORY_SURGERY_CENTER): Payer: Self-pay

## 2020-03-14 ENCOUNTER — Telehealth: Payer: Self-pay | Admitting: *Deleted

## 2020-03-14 ENCOUNTER — Other Ambulatory Visit: Payer: Self-pay

## 2020-03-14 VITALS — Ht 66.0 in | Wt 208.0 lb

## 2020-03-14 DIAGNOSIS — Z86010 Personal history of colonic polyps: Secondary | ICD-10-CM

## 2020-03-14 DIAGNOSIS — Z1211 Encounter for screening for malignant neoplasm of colon: Secondary | ICD-10-CM

## 2020-03-14 MED ORDER — SUTAB 1479-225-188 MG PO TABS: 12.0000 | ORAL_TABLET | ORAL | 0 refills | Status: DC

## 2020-03-14 NOTE — Telephone Encounter (Signed)
Alison Whitehead called to request we call outpt PT so she can call and make an appt. I have left a VM for her to call me back so that I can clarify what she says they are needing from [place].

## 2020-03-14 NOTE — Progress Notes (Signed)
No allergies to soy or egg Pt is not on blood thinners or diet pills  Unable to assesssedation/intubation has never been put to sleep except for what is done with colonoscopy.   Denies atrial flutter/fib  Has chronic constipation prep changed to a 2 day prep using Sutab  Emmi instructions given to pt  Pt is aware of Covid safety and care partner requirements.

## 2020-03-15 ENCOUNTER — Encounter: Payer: Self-pay | Admitting: Gastroenterology

## 2020-03-19 ENCOUNTER — Encounter: Payer: Medicare HMO | Admitting: Gastroenterology

## 2020-03-22 ENCOUNTER — Other Ambulatory Visit (HOSPITAL_COMMUNITY): Payer: Self-pay | Admitting: Family Medicine

## 2020-03-22 NOTE — Telephone Encounter (Signed)
Third attempt to reach  LVM to call back.

## 2020-03-27 ENCOUNTER — Encounter: Payer: Self-pay | Admitting: Certified Registered Nurse Anesthetist

## 2020-03-28 ENCOUNTER — Encounter: Payer: Self-pay | Admitting: Gastroenterology

## 2020-03-28 ENCOUNTER — Other Ambulatory Visit: Payer: Self-pay

## 2020-03-28 ENCOUNTER — Ambulatory Visit (AMBULATORY_SURGERY_CENTER): Payer: Medicare HMO | Admitting: Gastroenterology

## 2020-03-28 VITALS — BP 143/86 | HR 61 | Temp 97.1°F | Resp 15 | Ht 66.0 in | Wt 208.0 lb

## 2020-03-28 DIAGNOSIS — Z1211 Encounter for screening for malignant neoplasm of colon: Secondary | ICD-10-CM

## 2020-03-28 MED ORDER — SODIUM CHLORIDE 0.9 % IV SOLN: 500.0000 mL | Freq: Once | INTRAVENOUS | Status: DC

## 2020-03-28 NOTE — Progress Notes (Signed)
VS-CW  Pt's states no medical or surgical changes since previsit or office visit.  

## 2020-03-28 NOTE — Patient Instructions (Signed)
YOU HAD AN ENDOSCOPIC PROCEDURE TODAY AT [facility]:   Refer to the procedure report that was given to you for any specific questions about what was found during the examination.  If the procedure report does not answer your questions, please call your gastroenterologist to clarify.  If you requested that your care partner not be given the details of your procedure findings, then the procedure report has been included in a sealed envelope for you to review at your convenience later.  YOU SHOULD EXPECT: Some feelings of bloating in the abdomen. Passage of more gas than usual.  Walking can help get rid of the air that was put into your GI tract during the procedure and reduce the bloating. If you had a lower endoscopy (such as a colonoscopy or flexible sigmoidoscopy) you may notice spotting of blood in your stool or on the toilet paper. If you underwent a bowel prep for your procedure, you may not have a normal bowel movement for a few days.  Please Note:  You might notice some irritation and congestion in your nose or some drainage.  This is from the oxygen used during your procedure.  There is no need for concern and it should clear up in a day or so.  SYMPTOMS TO REPORT IMMEDIATELY:   Following lower endoscopy (colonoscopy or flexible sigmoidoscopy):  Excessive amounts of blood in the stool  Significant tenderness or worsening of abdominal pains  Swelling of the abdomen that is new, acute  Fever of 100F or higher    For urgent or emergent issues, a gastroenterologist can be reached at any hour by calling [phone]. Do not use MyChart messaging for urgent concerns.    DIET:  We do recommend a small meal at first, but then you may proceed to your regular diet.  Drink plenty of fluids but you should avoid alcoholic beverages for 24 hours.  ACTIVITY:  You should plan to take it easy for the rest of today and you should NOT DRIVE or use heavy machinery until tomorrow  (because of the sedation medicines used during the test).    FOLLOW UP: Our staff will call the number listed on your records 48-72 hours following your procedure to check on you and address any questions or concerns that you may have regarding the information given to you following your procedure. If we do not reach you, we will leave a message.  We will attempt to reach you two times.  During this call, we will ask if you have developed any symptoms of COVID 19. If you develop any symptoms (ie: fever, flu-like symptoms, shortness of breath, cough etc.) before then, please call [phone].  If you test positive for Covid 19 in the 2 weeks post procedure, please call and report this information to [place].    If any biopsies were taken you will be contacted by phone or by letter within the next 1-3 weeks.  Please call us at [phone] if you have not heard about the biopsies in 3 weeks.    SIGNATURES/CONFIDENTIALITY: You and/or your care partner have signed paperwork which will be entered into your electronic medical record.  These signatures attest to the fact that that the information above on your After Visit Summary has been reviewed and is understood.  Full responsibility of the confidentiality of this discharge information lies with you and/or your care-partner.   Resume medications.Information given on hemorrhoids and diverticulosis.

## 2020-03-28 NOTE — Progress Notes (Signed)
Report given to PACU, vss 

## 2020-03-28 NOTE — Op Note (Signed)
[place] Patient Name: Alison Whitehead Procedure Date: 03/28/2020 3:32 PM MRN: [id number] Endoscopist: Mauri Pole , MD Age: 69 Referring MD:  Date of Birth: 12-Apr-1951 Gender: Female Account #: [id number] Procedure:                Colonoscopy Indications:              Screening for colorectal malignant neoplasm Medicines:                Monitored Anesthesia Care Procedure:                Pre-Anesthesia Assessment:                           - Prior to the procedure, a History and Physical                            was performed, and patient medications and                            allergies were reviewed. The patient's tolerance of                            previous anesthesia was also reviewed. The risks                            and benefits of the procedure and the sedation                            options and risks were discussed with the patient.                            All questions were answered, and informed consent                            was obtained. Prior Anticoagulants: The patient has                            taken no previous anticoagulant or antiplatelet                            agents. ASA Grade Assessment: III - A patient with                            severe systemic disease. After reviewing the risks                            and benefits, the patient was deemed in                            satisfactory condition to undergo the procedure.                           After obtaining informed consent, the colonoscope  was passed under direct vision. Throughout the                            procedure, the patient's blood pressure, pulse, and                            oxygen saturations were monitored continuously. The                            Colonoscope was introduced through the anus and                            advanced to the the cecum, identified by                            appendiceal  orifice and ileocecal valve. The                            colonoscopy was performed without difficulty. The                            patient tolerated the procedure well. The quality                            of the bowel preparation was good. The ileocecal                            valve, appendiceal orifice, and rectum were                            photographed. Scope In: 3:43:49 PM Scope Out: 4:08:54 PM Scope Withdrawal Time: 0 hours 12 minutes 21 seconds  Total Procedure Duration: 0 hours 25 minutes 5 seconds  Findings:                 The perianal and digital rectal examinations were                            normal.                           Scattered small and large-mouthed diverticula were                            found in the sigmoid colon, descending colon,                            transverse colon and ascending colon.                           Non-bleeding internal hemorrhoids were found during                            retroflexion. The hemorrhoids were small. Complications:            No immediate complications. Estimated Blood Loss:  Estimated blood loss was minimal. Impression:               - Moderate diverticulosis in the sigmoid colon, in                            the descending colon, in the transverse colon and                            in the ascending colon.                           - Non-bleeding internal hemorrhoids.                           - No specimens collected. Recommendation:           - Patient has a contact number available for                            emergencies. The signs and symptoms of potential                            delayed complications were discussed with the                            patient. Return to normal activities tomorrow.                            Written discharge instructions were provided to the                            patient.                           - Resume previous diet.                            - Continue present medications.                           - Repeat colonoscopy in 10 years for screening                            purposes. Mauri Pole, MD 03/28/2020 4:13:56 PM This report has been signed electronically.

## 2020-03-30 ENCOUNTER — Telehealth: Payer: Self-pay

## 2020-03-30 ENCOUNTER — Telehealth: Payer: Self-pay | Admitting: *Deleted

## 2020-03-30 NOTE — Telephone Encounter (Signed)
NO ANSWER, MESSAGE LEFT FOR PATIENT. 

## 2020-03-30 NOTE — Telephone Encounter (Signed)
°  Follow up Call-  Call back number 03/28/2020  Post procedure Call Back phone  # [phone]  Permission to leave phone message Yes  Some recent data might be hidden     Patient questions:  Do you have a fever, pain , or abdominal swelling? No. Pain Score  0 *  Have you tolerated food without any problems? Yes.    Have you been able to return to your normal activities? Yes.    Do you have any questions about your discharge instructions: Diet   No. Medications  No. Follow up visit  No.  Do you have questions or concerns about your Care? No.  Actions: * If pain score is 4 or above: No action needed, pain <4.  1. Have you developed a fever since your procedure? no  2.   Have you had an respiratory symptoms (SOB or cough) since your procedure? no  3.   Have you tested positive for COVID 19 since your procedure no  4.   Have you had any family members/close contacts diagnosed with the COVID 19 since your procedure?  no   If yes to any of these questions please route to Joylene John, RN and Joella Prince, RN

## 2020-04-05 ENCOUNTER — Other Ambulatory Visit: Payer: Self-pay

## 2020-04-05 ENCOUNTER — Encounter: Payer: Self-pay | Admitting: Physical Therapy

## 2020-04-05 ENCOUNTER — Ambulatory Visit: Payer: Medicare HMO | Attending: Physical Medicine & Rehabilitation | Admitting: Physical Therapy

## 2020-04-05 DIAGNOSIS — M25511 Pain in right shoulder: Secondary | ICD-10-CM | POA: Diagnosis not present

## 2020-04-05 DIAGNOSIS — G8929 Other chronic pain: Secondary | ICD-10-CM | POA: Insufficient documentation

## 2020-04-05 DIAGNOSIS — M6281 Muscle weakness (generalized): Secondary | ICD-10-CM

## 2020-04-05 DIAGNOSIS — M62838 Other muscle spasm: Secondary | ICD-10-CM | POA: Diagnosis present

## 2020-04-05 DIAGNOSIS — M25611 Stiffness of right shoulder, not elsewhere classified: Secondary | ICD-10-CM | POA: Diagnosis present

## 2020-04-05 NOTE — Patient Instructions (Signed)
Access Code: [email or web address] URL: [email or web address] Date: 04/05/2020 Prepared by: Estill Bamberg April Thurnell Garbe  Exercises Seated Scapular Retraction - 1 x daily - 7 x weekly - 3 sets - 10 reps Seated Shoulder Abduction Towel Slide at Table Top - 1 x daily - 7 x weekly - 1 sets - 10 reps Seated Shoulder Flexion Towel Slide at Table Top - 1 x daily - 7 x weekly - 1 sets - 10 reps Seated Gentle Upper Trapezius Stretch - 1 x daily - 7 x weekly - 3 sets - 20 sec hold

## 2020-04-05 NOTE — Therapy (Signed)
[place], [place], [address] Phone: [phone]   Fax:  [phone]  Physical Therapy Evaluation  Patient Details  Name: Alison Whitehead MRN: [id number] Date of Birth: 1951-01-17 Referring Provider (PT): Meredith Staggers, MD   Encounter Date: 04/05/2020   PT End of Session - 04/05/20 1051    Visit Number 1    Number of Visits 12    Date for PT Re-Evaluation 05/17/20    Authorization Type [employer]na Medicare    PT Start Time 4174    PT Stop Time 1135    PT Time Calculation (min) 44 min    Activity Tolerance Patient tolerated treatment well    Behavior During Therapy [facility] for tasks assessed/performed           Past Medical History:  Diagnosis Date  . Allergy    seasonal  . Arthritis   . DDD (degenerative disc disease)   . Degenerative disc disease, lumbar   . Hypertension   . IBS (irritable bowel syndrome)   . Osteopenia   . Osteoporosis    osteopenia  . Stroke [facility]) 09/2019    Past Surgical History:  Procedure Laterality Date  . SHOULDER SURGERY Left    lipoma    There were no vitals filed for this visit.    Subjective Assessment - 04/05/20 1058    Subjective Pt states she was told she had a frozen right shoulder but was told by Dr. Naaman Plummer that he doesn't think this is the case and thinks PT would help. Pt states she goes to the pain clinic for arthritis in multiple joints. Pt notes that her right shoulder pain wakes her at night. She states at rest her R shoulder hurts and with movement it hurts more. At its worst she reports pain is 10/10. Pt reports she is able to perform her ADLs but it hurts and is difficult. She is unable to specify where she has exact pain; however, today she feels it in the front.    Pertinent History arthritis    Limitations Lifting    How long can you sit comfortably? n/a    How long can you stand comfortably? Limited due to other factors (i.e. back  pain/arthritis) unrelated to shoulder    How long can you walk comfortably? Limited due to other factors (i.e. back pain/arthritis) unrelated to shoulder    Patient Stated Goals Decrease pain    Currently in Pain? Yes    Pain Score 10-Worst pain ever    Pain Location Shoulder    Pain Orientation Right    Pain Descriptors / Indicators Sharp;Sore    Pain Type Chronic pain    Pain Onset More than a month ago    Aggravating Factors  Any shoulder movement    Pain Relieving Factors nothing    Effect of Pain on Daily Activities Unable to raise arm up              [facility] PT Assessment - 04/05/20 0001      Assessment   Medical Diagnosis  right Rotator Cuff Syndrome    Referring Provider (PT) Meredith Staggers, MD    Prior Therapy None      Precautions   Precautions None      Restrictions   Weight Bearing Restrictions No      Balance Screen   Has the patient fallen in the past 6 months No   However, pt ambulates with cane and does not  appear steady     [place] residence      Prior Function   Level of Independence Independent    Vocation Unemployed      Observation/Other Assessments   Focus on Therapeutic Outcomes (FOTO)  55% limitation      AROM   Overall AROM Comments Performed in sitting    Right Shoulder Flexion 42 Degrees    Right Shoulder ABduction 40 Degrees    Right Shoulder Internal Rotation --   WFL   Right Shoulder External Rotation 40 Degrees      PROM   Overall PROM Comments Performed in supine    Right Shoulder Flexion 140 Degrees    Right Shoulder ABduction 90 Degrees      Strength   Overall Strength Comments Shoulder MMT limited due to pt guarding/pain with attempted resistance    Right/Left Elbow Right    Right Elbow Flexion 5/5    Right Elbow Extension 5/5    Right Wrist Flexion 5/5    Right Wrist Extension 5/5    Right Hand Gross Grasp Functional      Palpation   Palpation comment Tight/spasming upper  trap, supraspinatus and lats                      Objective measurements completed on examination: See above findings.               PT Education - 04/05/20 1320    Education Details Discussed exam findings. Discussed performing gentle stretching and AROM as well as strengthening her scapula. Will need to review FOTO with her next visit    Person(s) Educated Patient    Methods Explanation;Demonstration;Tactile cues;Verbal cues;Handout    Comprehension Verbalized understanding;Returned demonstration;Verbal cues required;Tactile cues required            PT Short Term Goals - 04/05/20 1327      PT SHORT TERM GOAL #1   Title Pt will be independent with initial HEP    Baseline Newly provided    Time 3    Period Weeks    Status New    Target Date 04/26/20      PT SHORT TERM GOAL #2   Title Pt will be able to achieve at least 90 deg of right shoulder flexion and abduction in sitting    Baseline <45 deg in AROM due to pain    Time 3    Period Weeks    Status New    Target Date 04/26/20      PT SHORT TERM GOAL #3   Title Pt will report pain </=8/10 at worst for improved QoL    Time 3    Period Weeks    Status New    Target Date 04/26/20      PT SHORT TERM GOAL #4   Title Pt will report 50% improvement in her ability to perform dressing and bathing tasks    Baseline Pt states she is able to perform these; however, with a lot of pain    Time 3    Period Weeks    Status New    Target Date 04/26/20             PT Long Term Goals - 04/05/20 1332      PT LONG TERM GOAL #1   Title Pt will be independent with advanced HEP    Time 6    Period Weeks    Status  New    Target Date 05/17/20      PT LONG TERM GOAL #2   Title Pt will have WFL right shoulder ROM against gravity    Time 6    Period Weeks    Status New    Target Date 05/17/20      PT LONG TERM GOAL #3   Title Pt will be able to place cups into her kitchen cabinet    Baseline Unable     Time 6    Period Weeks    Status New    Target Date 05/17/20      PT LONG TERM GOAL #4   Title Pt will report decrease in shoulder pain to </=5/10 at worst    Time 6    Period Weeks    Status New    Target Date 05/17/20                  Plan - 04/05/20 1321    Clinical Impression Statement Ms. Mauro is a 69 y/o F presenting to OPPT primarily due to complaint of right shoulder pain and decreased motion. Pt demonstrates poor R scapulohumeral rhythm, decreased periscapular muscle strength, highly active/spasming upper trap and supraspinatus, affecting pt's ROM and leading to ongoing pain and dysfunction limiting her ability to perform overhead activities and ADL tasks. Pt would benefit from therapy to address these issues to optimize her level of function    Personal Factors and Comorbidities Age;Time since onset of injury/illness/exacerbation;Comorbidity 1;Comorbidity 2;Comorbidity 3+    Comorbidities arthritis, osteoporosis, HTN, recent CVA this year (pt unable to state what CVA affected), HTN    Examination-Activity Limitations Bathing;Bed Mobility;Carry;Hygiene/Grooming;Lift;Reach Overhead;Toileting    Examination-Participation Restrictions Church;Cleaning;Community Activity;Driving;Shop;[employer]    Stability/Clinical Decision Making Evolving/Moderate complexity    Clinical Decision Making Moderate    Rehab Potential Good    PT Frequency 2x / week    PT Duration 6 weeks    PT Treatment/Interventions ADLs/Self Care Home Management;Aquatic Therapy;Cryotherapy;Electrical Stimulation;Iontophoresis 4mg /ml Dexamethasone;Moist Heat;Traction;Ultrasound;DME Instruction;Functional mobility training;Therapeutic activities;Therapeutic exercise;Neuromuscular re-education;Patient/family education;Manual techniques;Passive range of motion;Dry needling;Taping    PT Next Visit Plan Assess response to HEP. Continue manual and PROM as needed for R shoulder. Progress AAROM to AROM if  able. Strengthen periscapular muscles.    PT Home Exercise Plan Access Code: [email or web address]    Consulted and Agree with Plan of Care Patient           Patient will benefit from skilled therapeutic intervention in order to improve the following deficits and impairments:  Decreased coordination, Decreased range of motion, Increased fascial restricitons, Increased muscle spasms, Impaired UE functional use, Pain, Impaired flexibility, Decreased mobility, Decreased strength, Postural dysfunction  Visit Diagnosis: Chronic right shoulder pain  Stiffness of right shoulder, not elsewhere classified  Muscle weakness (generalized)  Other muscle spasm     Problem List Patient Active Problem List   Diagnosis Date Noted  . Rotator cuff syndrome, left 01/11/2020  . Labile blood glucose   . Vascular headache   . Small vessel disease, cerebrovascular   . Prediabetes   . AKI (acute kidney injury) ([place])   . Right pontine stroke ([place]) 10/06/2019  . Acute cerebrovascular accident (CVA) ([place]) 09/30/2019  . Unilateral primary osteoarthritis, left knee 03/17/2019  . Unilateral primary osteoarthritis, right knee 03/17/2019  . Bilateral primary osteoarthritis of knee 03/10/2019  . INSOMNIA, CHRONIC 05/19/2007  . SPONDYLOSIS, LUMBAR 05/19/2007  . Essential hypertension 05/03/2007  . ARTHRITIS, KNEE 05/03/2007    Estill Bamberg April Ma  L Analyn Matusek PT, DPT 04/05/2020, 1:40 PM  [facility] [address] [place], [place], [address] Phone: [phone]   Fax:  [phone]  Name: Shaindy Reader MRN: [id number] Date of Birth: 12/01/1950

## 2020-04-10 ENCOUNTER — Ambulatory Visit: Payer: Medicare HMO | Admitting: Physical Therapy

## 2020-04-10 ENCOUNTER — Other Ambulatory Visit: Payer: Self-pay

## 2020-04-10 DIAGNOSIS — G8929 Other chronic pain: Secondary | ICD-10-CM

## 2020-04-10 DIAGNOSIS — M25611 Stiffness of right shoulder, not elsewhere classified: Secondary | ICD-10-CM

## 2020-04-10 DIAGNOSIS — M62838 Other muscle spasm: Secondary | ICD-10-CM

## 2020-04-10 DIAGNOSIS — M25511 Pain in right shoulder: Secondary | ICD-10-CM | POA: Diagnosis not present

## 2020-04-10 DIAGNOSIS — M6281 Muscle weakness (generalized): Secondary | ICD-10-CM

## 2020-04-10 NOTE — Therapy (Signed)
[place], [place], [address] Phone: [phone]   Fax:  [phone]  Physical Therapy Treatment  Patient Details  Name: Alison Whitehead MRN: [id number] Date of Birth: August 09, 1950 Referring Provider (PT): Meredith Staggers, MD   Encounter Date: 04/10/2020   PT End of Session - 04/10/20 1319    Visit Number 2    Number of Visits 12    Date for PT Re-Evaluation 05/17/20    Authorization Type [employer]na Medicare    PT Start Time 1320    PT Stop Time 1405    PT Time Calculation (min) 45 min    Activity Tolerance Patient tolerated treatment well    Behavior During Therapy [facility] for tasks assessed/performed           Past Medical History:  Diagnosis Date  . Allergy    seasonal  . Arthritis   . DDD (degenerative disc disease)   . Degenerative disc disease, lumbar   . Hypertension   . IBS (irritable bowel syndrome)   . Osteopenia   . Osteoporosis    osteopenia  . Stroke [facility]) 09/2019    Past Surgical History:  Procedure Laterality Date  . SHOULDER SURGERY Left    lipoma    There were no vitals filed for this visit.   Subjective Assessment - 04/10/20 1323    Subjective Pt states she's been doing the exercises at home and she likes them.    Pertinent History arthritis    Limitations Lifting    How long can you sit comfortably? n/a    How long can you stand comfortably? Limited due to other factors (i.e. back pain/arthritis) unrelated to shoulder    How long can you walk comfortably? Limited due to other factors (i.e. back pain/arthritis) unrelated to shoulder    Patient Stated Goals Decrease pain    Currently in Pain? Yes    Pain Score 10-Worst pain ever    Pain Location Shoulder    Pain Orientation Right    Pain Descriptors / Indicators Sharp;Sore    Pain Type Chronic pain    Pain Onset More than a month ago                             [facility] Adult PT Treatment/Exercise  - 04/10/20 0001      Shoulder Exercises: Seated   Retraction Strengthening;Both;10 reps    Row Strengthening;Both;10 reps;Theraband    Theraband Level (Shoulder Row) Level 2 (Red)    Other Seated Exercises attempted shoulder ER but too painful for pt      Shoulder Exercises: Sidelying   Other Sidelying Exercises Scapular inferior and medial motions x 10 each      Shoulder Exercises: Pulleys   Flexion 1 minute    ABduction 1 minute      Shoulder Exercises: Stretch   Other Shoulder Stretches Upper trap stretch x 30 sec; lat stretch in supine x 30 sec    Other Shoulder Stretches Bicep stretch x20 sec each palm against table, thumb against table, back of hand against table      Manual Therapy   Manual Therapy Joint mobilization;Soft tissue mobilization    Joint Mobilization gentle GH traction and inferior mobilizations   pt reported traction feeling good   Soft tissue mobilization STW upper trap, pecs, biceps, lats  PT Short Term Goals - 04/05/20 1327      PT SHORT TERM GOAL #1   Title Pt will be independent with initial HEP    Baseline Newly provided    Time 3    Period Weeks    Status New    Target Date 04/26/20      PT SHORT TERM GOAL #2   Title Pt will be able to achieve at least 90 deg of right shoulder flexion and abduction in sitting    Baseline <45 deg in AROM due to pain    Time 3    Period Weeks    Status New    Target Date 04/26/20      PT SHORT TERM GOAL #3   Title Pt will report pain </=8/10 at worst for improved QoL    Time 3    Period Weeks    Status New    Target Date 04/26/20      PT SHORT TERM GOAL #4   Title Pt will report 50% improvement in her ability to perform dressing and bathing tasks    Baseline Pt states she is able to perform these; however, with a lot of pain    Time 3    Period Weeks    Status New    Target Date 04/26/20             PT Long Term Goals - 04/05/20 1332      PT LONG TERM GOAL #1    Title Pt will be independent with advanced HEP    Time 6    Period Weeks    Status New    Target Date 05/17/20      PT LONG TERM GOAL #2   Title Pt will have WFL right shoulder ROM against gravity    Time 6    Period Weeks    Status New    Target Date 05/17/20      PT LONG TERM GOAL #3   Title Pt will be able to place cups into her kitchen cabinet    Baseline Unable    Time 6    Period Weeks    Status New    Target Date 05/17/20      PT LONG TERM GOAL #4   Title Pt will report decrease in shoulder pain to </=5/10 at worst    Time 6    Period Weeks    Status New    Target Date 05/17/20      PT LONG TERM GOAL #5   Title Pt will have improved FOTO score to at least 40%    Baseline 55% limitation initially    Time 6    Period Weeks    Status New    Target Date 05/17/20                 Plan - 04/10/20 1408    Clinical Impression Statement Treatment focused on neuro re-ed and strengthening for scapular stability and firing of mid and low trap. Increased time spent with manual therapy and stretching to address pt's shoulder tightness -- improved by end of session. Pt's cane height may need to be adjusted as it maintains her R bicep in flexion and rounded shoulder -- will need this education next session.    Personal Factors and Comorbidities Age;Time since onset of injury/illness/exacerbation;Comorbidity 1;Comorbidity 2;Comorbidity 3+    Comorbidities arthritis, osteoporosis, HTN, recent CVA this year (pt unable to state what CVA affected), HTN  Examination-Activity Limitations Bathing;Bed Mobility;Carry;Hygiene/Grooming;Lift;Reach Overhead;Toileting    Examination-Participation Restrictions Church;Cleaning;Community Activity;Driving;Shop;[employer]    Stability/Clinical Decision Making Evolving/Moderate complexity    Rehab Potential Good    PT Frequency 2x / week    PT Duration 6 weeks    PT Treatment/Interventions ADLs/Self Care Home Management;Aquatic  Therapy;Cryotherapy;Electrical Stimulation;Iontophoresis 4mg /ml Dexamethasone;Moist Heat;Traction;Ultrasound;DME Instruction;Functional mobility training;Therapeutic activities;Therapeutic exercise;Neuromuscular re-education;Patient/family education;Manual techniques;Passive range of motion;Dry needling;Taping    PT Next Visit Plan Assess response to HEP. Continue manual and PROM as needed for R shoulder. Progress AAROM to AROM if able. Strengthen periscapular muscles. Discuss cane height as it might be relating to her shoulder pain.    PT Home Exercise Plan Access Code: [email or web address]. Table AAROM, row, scapular retraction, bicep stretch    Consulted and Agree with Plan of Care Patient           Patient will benefit from skilled therapeutic intervention in order to improve the following deficits and impairments:  Decreased coordination, Decreased range of motion, Increased fascial restricitons, Increased muscle spasms, Impaired UE functional use, Pain, Impaired flexibility, Decreased mobility, Decreased strength, Postural dysfunction  Visit Diagnosis: Chronic right shoulder pain  Stiffness of right shoulder, not elsewhere classified  Muscle weakness (generalized)  Other muscle spasm     Problem List Patient Active Problem List   Diagnosis Date Noted  . Rotator cuff syndrome, left 01/11/2020  . Labile blood glucose   . Vascular headache   . Small vessel disease, cerebrovascular   . Prediabetes   . AKI (acute kidney injury) ([place])   . Right pontine stroke ([place]) 10/06/2019  . Acute cerebrovascular accident (CVA) ([place]) 09/30/2019  . Unilateral primary osteoarthritis, left knee 03/17/2019  . Unilateral primary osteoarthritis, right knee 03/17/2019  . Bilateral primary osteoarthritis of knee 03/10/2019  . INSOMNIA, CHRONIC 05/19/2007  . SPONDYLOSIS, LUMBAR 05/19/2007  . Essential hypertension 05/03/2007  . ARTHRITIS, KNEE 05/03/2007    [facility] [address] PT, DPT 04/10/2020, 2:14  PM  [facility] [address] [place], [place], [address] Phone: [phone]   Fax:  [phone]  Name: Alan Riles MRN: [id number] Date of Birth: [birth year]

## 2020-04-16 ENCOUNTER — Other Ambulatory Visit: Payer: Self-pay

## 2020-04-16 ENCOUNTER — Ambulatory Visit: Payer: Medicare HMO | Admitting: Physical Therapy

## 2020-04-16 DIAGNOSIS — M25511 Pain in right shoulder: Secondary | ICD-10-CM

## 2020-04-16 DIAGNOSIS — M25611 Stiffness of right shoulder, not elsewhere classified: Secondary | ICD-10-CM

## 2020-04-16 DIAGNOSIS — M6281 Muscle weakness (generalized): Secondary | ICD-10-CM

## 2020-04-16 DIAGNOSIS — G8929 Other chronic pain: Secondary | ICD-10-CM

## 2020-04-16 DIAGNOSIS — M62838 Other muscle spasm: Secondary | ICD-10-CM

## 2020-04-16 NOTE — Therapy (Signed)
[place], [place], [address] Phone: [phone]   Fax:  [phone]  Physical Therapy Treatment  Patient Details  Name: Alison Whitehead MRN: [id number] Date of Birth: 02-15-1951 Referring Provider (PT): Meredith Staggers, MD   Encounter Date: 04/16/2020   PT End of Session - 04/16/20 0943    Visit Number 3    Number of Visits 12    Date for PT Re-Evaluation 05/17/20    Authorization Type [employer] Medicare    PT Start Time [phone]    PT Stop Time 1008    PT Time Calculation (min) 46 min    Activity Tolerance Patient tolerated treatment well    Behavior During Therapy [facility] for tasks assessed/performed           Past Medical History:  Diagnosis Date  . Allergy    seasonal  . Arthritis   . DDD (degenerative disc disease)   . Degenerative disc disease, lumbar   . Hypertension   . IBS (irritable bowel syndrome)   . Osteopenia   . Osteoporosis    osteopenia  . Stroke [facility]) 09/2019    Past Surgical History:  Procedure Laterality Date  . SHOULDER SURGERY Left    lipoma    There were no vitals filed for this visit.   Subjective Assessment - 04/16/20 0925    Subjective Pt states her arm was hurting after last session. Today is the first day her arm feels good.    Pertinent History arthritis    Limitations Lifting    How long can you sit comfortably? n/a    How long can you stand comfortably? Limited due to other factors (i.e. back pain/arthritis) unrelated to shoulder    How long can you walk comfortably? Limited due to other factors (i.e. back pain/arthritis) unrelated to shoulder    Patient Stated Goals Decrease pain    Currently in Pain? Yes    Pain Score 6     Pain Location Shoulder    Pain Orientation Right    Pain Onset More than a month ago                             [facility] Adult PT Treatment/Exercise - 04/16/20 0001      Shoulder Exercises: Seated    Retraction Strengthening;Both;10 reps    Row Strengthening;Both;20 reps;Theraband    Theraband Level (Shoulder Row) Level 3 (Green)    Horizontal ABduction Strengthening;Both;20 reps;Theraband    Theraband Level (Shoulder Horizontal ABduction) Level 1 (Yellow)    External Rotation Strengthening;Both;20 reps    Other Seated Exercises scapular clock x10 in all directions      Shoulder Exercises: Standing   Other Standing Exercises finger wall crawl flexion & abduction x 5 each                    PT Short Term Goals - 04/05/20 1327      PT SHORT TERM GOAL #1   Title Pt will be independent with initial HEP    Baseline Newly provided    Time 3    Period Weeks    Status New    Target Date 04/26/20      PT SHORT TERM GOAL #2   Title Pt will be able to achieve at least 90 deg of right shoulder flexion and abduction in sitting    Baseline <45 deg in AROM due to pain  Time 3    Period Weeks    Status New    Target Date 04/26/20      PT SHORT TERM GOAL #3   Title Pt will report pain </=8/10 at worst for improved QoL    Time 3    Period Weeks    Status New    Target Date 04/26/20      PT SHORT TERM GOAL #4   Title Pt will report 50% improvement in her ability to perform dressing and bathing tasks    Baseline Pt states she is able to perform these; however, with a lot of pain    Time 3    Period Weeks    Status New    Target Date 04/26/20             PT Long Term Goals - 04/05/20 1332      PT LONG TERM GOAL #1   Title Pt will be independent with advanced HEP    Time 6    Period Weeks    Status New    Target Date 05/17/20      PT LONG TERM GOAL #2   Title Pt will have WFL right shoulder ROM against gravity    Time 6    Period Weeks    Status New    Target Date 05/17/20      PT LONG TERM GOAL #3   Title Pt will be able to place cups into her kitchen cabinet    Baseline Unable    Time 6    Period Weeks    Status New    Target Date 05/17/20       PT LONG TERM GOAL #4   Title Pt will report decrease in shoulder pain to </=5/10 at worst    Time 6    Period Weeks    Status New    Target Date 05/17/20      PT LONG TERM GOAL #5   Title Pt will have improved FOTO score to at least 40%    Baseline 55% limitation initially    Time 6    Period Weeks    Status New    Target Date 05/17/20                 Plan - 04/16/20 0956    Clinical Impression Statement Pt with less shoulder pain but has increased muscle soreness. Treatment focused on increased scap strengthening and improving scapulohumeral rhythm. Pt continues to require cues to decrease shoulder hiking. In regards to cane, pt states her cane was lost and the current cane was the only one she had available. Discussed continuing to stretch neck/shoulder and bicep.    Personal Factors and Comorbidities Age;Time since onset of injury/illness/exacerbation;Comorbidity 1;Comorbidity 2;Comorbidity 3+    Comorbidities arthritis, osteoporosis, HTN, recent CVA this year (pt unable to state what CVA affected), HTN    Examination-Activity Limitations Bathing;Bed Mobility;Carry;Hygiene/Grooming;Lift;Reach Overhead;Toileting    Examination-Participation Restrictions Church;Cleaning;Community Activity;Driving;Shop;[employer]    Stability/Clinical Decision Making Evolving/Moderate complexity    Rehab Potential Good    PT Frequency 2x / week    PT Duration 6 weeks    PT Treatment/Interventions ADLs/Self Care Home Management;Aquatic Therapy;Cryotherapy;Electrical Stimulation;Iontophoresis 4mg /ml Dexamethasone;Moist Heat;Traction;Ultrasound;DME Instruction;Functional mobility training;Therapeutic activities;Therapeutic exercise;Neuromuscular re-education;Patient/family education;Manual techniques;Passive range of motion;Dry needling;Taping    PT Next Visit Plan Assess response to HEP. Continue manual and PROM as needed for R shoulder. Progress AAROM to AROM if able. Strengthen periscapular  muscles. Discuss cane height as  it might be relating to her shoulder pain.    PT Home Exercise Plan Access Code: [email or web address]. Table AAROM, row, scapular retraction, bicep stretch    Consulted and Agree with Plan of Care Patient           Patient will benefit from skilled therapeutic intervention in order to improve the following deficits and impairments:  Decreased coordination, Decreased range of motion, Increased fascial restricitons, Increased muscle spasms, Impaired UE functional use, Pain, Impaired flexibility, Decreased mobility, Decreased strength, Postural dysfunction  Visit Diagnosis: Chronic right shoulder pain  Stiffness of right shoulder, not elsewhere classified  Muscle weakness (generalized)  Other muscle spasm     Problem List Patient Active Problem List   Diagnosis Date Noted  . Rotator cuff syndrome, left 01/11/2020  . Labile blood glucose   . Vascular headache   . Small vessel disease, cerebrovascular   . Prediabetes   . AKI (acute kidney injury) ([place])   . Right pontine stroke ([place]) 10/06/2019  . Acute cerebrovascular accident (CVA) ([place]) 09/30/2019  . Unilateral primary osteoarthritis, left knee 03/17/2019  . Unilateral primary osteoarthritis, right knee 03/17/2019  . Bilateral primary osteoarthritis of knee 03/10/2019  . INSOMNIA, CHRONIC 05/19/2007  . SPONDYLOSIS, LUMBAR 05/19/2007  . Essential hypertension 05/03/2007  . ARTHRITIS, KNEE 05/03/2007    [facility] [address] [place]  PT, DPT 04/16/2020, 10:08 AM  [facility] [address] [place], [place], [address] Phone: [phone]   Fax:  [phone]  Name: Alison Whitehead MRN: [id number] Date of Birth: Mar 16, 1951

## 2020-04-18 ENCOUNTER — Encounter: Payer: Medicare HMO | Attending: Physical Medicine & Rehabilitation | Admitting: Physical Medicine & Rehabilitation

## 2020-04-18 ENCOUNTER — Encounter: Payer: Self-pay | Admitting: Physical Medicine & Rehabilitation

## 2020-04-18 ENCOUNTER — Other Ambulatory Visit: Payer: Self-pay

## 2020-04-18 VITALS — BP 120/82 | HR 84 | Temp 98.0°F | Ht 66.0 in | Wt 205.0 lb

## 2020-04-18 DIAGNOSIS — M1711 Unilateral primary osteoarthritis, right knee: Secondary | ICD-10-CM | POA: Insufficient documentation

## 2020-04-18 DIAGNOSIS — M75101 Unspecified rotator cuff tear or rupture of right shoulder, not specified as traumatic: Secondary | ICD-10-CM | POA: Diagnosis not present

## 2020-04-18 DIAGNOSIS — M1712 Unilateral primary osteoarthritis, left knee: Secondary | ICD-10-CM | POA: Diagnosis not present

## 2020-04-18 DIAGNOSIS — I6350 Cerebral infarction due to unspecified occlusion or stenosis of unspecified cerebral artery: Secondary | ICD-10-CM | POA: Insufficient documentation

## 2020-04-18 DIAGNOSIS — I10 Essential (primary) hypertension: Secondary | ICD-10-CM | POA: Insufficient documentation

## 2020-04-18 NOTE — Progress Notes (Signed)
Subjective:    Patient ID: Alison Whitehead, female    DOB: [birth year], 69 y.o.   MRN: [id number]  HPI  Alison Whitehead is here in follow-up of her right pontine infarct and associated deficits.  At her last visit in June we injected her left knee which provided results for a day or 2 when the pain began to really turn.  She just got into therapy for her right shoulder.  Right shoulder continues to give her pain with rotational movements in particular.  She has had 3 visits so far.  She is working on home exercises and still having a good deal of pain.  She will use Voltaren gel on her knees as well as occasional hydrocodone provided by her pain clinic.  Is also on Topamax 50 mg twice a day and gabapentin 100 mg 3 times daily    Pain Inventory Average Pain 8 Pain Right Now 9 My pain is constant, sharp, stabbing and aching  In the last 24 hours, has pain interfered with the following? General activity 8 Relation with others 0 Enjoyment of life 0 What TIME of day is your pain at its worst? evening Sleep (in general) Fair  Pain is worse with: walking and some activites Pain improves with: rest and medication Relief from Meds: 5  Family History  Problem Relation Age of Onset  . Breast cancer Maternal Aunt   . Diabetes Mother   . Hypertension Mother   . Glaucoma Mother   . Other Father        accident  . Diabetes Sister   . Hypertension Sister   . Cancer Sister   . Colon cancer Neg Hx   . Colon polyps Neg Hx   . Esophageal cancer Neg Hx   . Stomach cancer Neg Hx   . Rectal cancer Neg Hx    Social History   Socioeconomic History  . Marital status: Widowed    Spouse name: Not on file  . Number of children: 2  . Years of education: Not on file  . Highest education level: Some college, no degree  Occupational History    Comment: retired  Tobacco Use  . Smoking status: Former Smoker    Quit date: 10/13/2019    Years since quitting: 0.5  . Smokeless tobacco: Never  Used  Vaping Use  . Vaping Use: Never used  Substance and Sexual Activity  . Alcohol use: Never  . Drug use: Never  . Sexual activity: Not Currently    Birth control/protection: None  Other Topics Concern  . Not on file  Social History Narrative   12/13/19 Lives alone   No caffeine   Social Determinants of Health   Financial Resource Strain:   . Difficulty of Paying Living Expenses: Not on file  Food Insecurity:   . Worried About Charity fundraiser in the Last Year: Not on file  . Ran Out of Food in the Last Year: Not on file  Transportation Needs:   . Lack of Transportation (Medical): Not on file  . Lack of Transportation (Non-Medical): Not on file  Physical Activity:   . Days of Exercise per Week: Not on file  . Minutes of Exercise per Session: Not on file  Stress:   . Feeling of Stress : Not on file  Social Connections:   . Frequency of Communication with Friends and Family: Not on file  . Frequency of Social Gatherings with Friends and Family: Not on file  . Attends  Religious Services: Not on file  . Active Member of Clubs or Organizations: Not on file  . Attends Archivist Meetings: Not on file  . Marital Status: Not on file   Past Surgical History:  Procedure Laterality Date  . SHOULDER SURGERY Left    lipoma   Past Surgical History:  Procedure Laterality Date  . SHOULDER SURGERY Left    lipoma   Past Medical History:  Diagnosis Date  . Allergy    seasonal  . Arthritis   . DDD (degenerative disc disease)   . Degenerative disc disease, lumbar   . Hypertension   . IBS (irritable bowel syndrome)   . Osteopenia   . Osteoporosis    osteopenia  . Stroke ([place]) 09/2019   BP 120/82   Pulse 84   Temp 98 F (36.7 C)   Ht 5\' 6"  (1.676 m)   Wt 205 lb (93 kg)   SpO2 95%   BMI 33.09 kg/m   Opioid Risk Score:   Fall Risk Score:  `1  Depression screen PHQ 2/9  No flowsheet data found. Review of Systems  Constitutional: Negative.   HENT:  Negative.   Eyes: Negative.   Respiratory: Negative.   Cardiovascular: Negative.   Gastrointestinal: Negative.   Endocrine: Negative.   Genitourinary: Negative.   Musculoskeletal: Positive for back pain and gait problem.       Knees & back  Skin: Negative.   Allergic/Immunologic: Negative.   Hematological: Negative.   Psychiatric/Behavioral: Negative.        Objective:   Physical Exam  General: No acute distress HEENT: EOMI, oral membranes moist Cards: reg rate  Chest: normal effort Abdomen: Soft, NT, ND Skin: dry, intact Extremities: no edema Neuro: Pt is cognitively appropriate with normal insight, memory, and awareness. Cranial nerves 2-12 are intact. Sensory exam is normal. Reflexes are 2+ in all 4's. Fine motor coordination is intact. No tremors. Motor function is grossly 5/5 RUE and RLE 5/5. Mild pronator drift on left is minimal.    [name] is appropriate  musculoskeletal:  Mild crepitus in left knee>right. Left knee effusion.  She is antalgic with weightbearing on the left.  right shoulder +impingement manuever. Psych:  pleasant.            Assessment & Plan:  Medical Problem List and Plan: 1.  Hx of recent right posterior pontine infarct, chronic subcortical/bg infarcts likely d/t SVD             -Continue with home exercise program.  Patient is very steady with her gait at this point except for any imbalances related to her left knee pain. 2.  Post- stroke HA:             -Continue Topamax 50 mg twice daily for headaches.               -Continue this medication for now as headaches are controlled 3. HTN:  This is followed by cardiology and primary physician. Much improved 4.  Right rotator cuff tendinitis: outpt PT ongoing. She just started last week. Encouraged her to work through as possible  -if pain doesn't improve, will inject shoulder at next visit.  5. OA bilateral knees: continue Voltaren gel to knees             -left knee was injected in June  with good results. Pain returned quickly  -will arrange monovisc injection left knee insurance permitting.   -Discussed appropriate shoewear to provide adequate arch  support and shock absorption  15 minutes spent with patient discussing treatment and reviewing chart and in the examination.  I will see her back in about a month's time.

## 2020-04-18 NOTE — Patient Instructions (Signed)
PLEASE FEEL FREE TO CALL OUR OFFICE WITH ANY PROBLEMS OR QUESTIONS ([phone])      

## 2020-04-20 ENCOUNTER — Ambulatory Visit: Payer: Medicare HMO | Admitting: Physical Therapy

## 2020-04-20 ENCOUNTER — Other Ambulatory Visit: Payer: Self-pay

## 2020-04-20 DIAGNOSIS — M62838 Other muscle spasm: Secondary | ICD-10-CM

## 2020-04-20 DIAGNOSIS — M25611 Stiffness of right shoulder, not elsewhere classified: Secondary | ICD-10-CM

## 2020-04-20 DIAGNOSIS — G8929 Other chronic pain: Secondary | ICD-10-CM

## 2020-04-20 DIAGNOSIS — M25511 Pain in right shoulder: Secondary | ICD-10-CM | POA: Diagnosis not present

## 2020-04-20 DIAGNOSIS — M6281 Muscle weakness (generalized): Secondary | ICD-10-CM

## 2020-04-20 NOTE — Therapy (Signed)
[place], [place], [address] Phone: [phone]   Fax:  [phone]  Physical Therapy Treatment  Patient Details  Name: Alison Whitehead MRN: [id number] Date of Birth: 1951-05-22 Referring Provider (PT): Meredith Staggers, MD   Encounter Date: 04/20/2020   PT End of Session - 04/20/20 0918    Visit Number 4    Number of Visits 12    Date for PT Re-Evaluation 05/17/20    Authorization Type Aetna Medicare    PT Start Time [phone]    PT Stop Time 0918    PT Time Calculation (min) 45 min    Activity Tolerance Patient tolerated treatment well    Behavior During Therapy [facility] for tasks assessed/performed           Past Medical History:  Diagnosis Date  . Allergy    seasonal  . Arthritis   . DDD (degenerative disc disease)   . Degenerative disc disease, lumbar   . Hypertension   . IBS (irritable bowel syndrome)   . Osteopenia   . Osteoporosis    osteopenia  . Stroke [facility]) 09/2019    Past Surgical History:  Procedure Laterality Date  . SHOULDER SURGERY Left    lipoma    There were no vitals filed for this visit.                      [facility] Adult PT Treatment/Exercise - 04/20/20 0001      Elbow Exercises   Elbow Flexion Strengthening;Right;20 reps;Supine;Bar weights/barbell   1 lb     Shoulder Exercises: Supine   Other Supine Exercises tricep 2x10 1 lb    Other Supine Exercises bicep stretch x30 sec      Hand Exercises for Cervical Radiculopathy   Other Hand Exercise for Cervical Radiculopathy Ulner nerve stretch with R neck side bend & R arm abducted & wrist extended 2x30 sec, median nerve glide 2x10      Manual Therapy   Manual Therapy Joint mobilization;Soft tissue mobilization    Joint Mobilization gentle GH distraction    Soft tissue mobilization STW and trigger point release of bicep, mid deltoid, brachialis                  PT Education - 04/20/20  1027    Education Details Discussed nerve and bicep stretching & pathology. Discussed repositioning herself to reduce shortening of her ulner/median nerve and biceps at home. Reinforced HEP. Discussed with pt to ask her ortho or MD about the small bulge near her bicep tendon. Educated pt that her orthoepdic will provide the injection in her knee and shoulder if indicated on her next visit and not PT.    Person(s) Educated Patient    Methods Explanation;Demonstration;Tactile cues;Verbal cues;Handout    Comprehension Verbalized understanding;Returned demonstration;Verbal cues required;Tactile cues required            PT Short Term Goals - 04/05/20 1327      PT SHORT TERM GOAL #1   Title Pt will be independent with initial HEP    Baseline Newly provided    Time 3    Period Weeks    Status New    Target Date 04/26/20      PT SHORT TERM GOAL #2   Title Pt will be able to achieve at least 90 deg of right shoulder flexion and abduction in sitting    Baseline <45 deg in AROM due to pain  Time 3    Period Weeks    Status New    Target Date 04/26/20      PT SHORT TERM GOAL #3   Title Pt will report pain </=8/10 at worst for improved QoL    Time 3    Period Weeks    Status New    Target Date 04/26/20      PT SHORT TERM GOAL #4   Title Pt will report 50% improvement in her ability to perform dressing and bathing tasks    Baseline Pt states she is able to perform these; however, with a lot of pain    Time 3    Period Weeks    Status New    Target Date 04/26/20             PT Long Term Goals - 04/05/20 1332      PT LONG TERM GOAL #1   Title Pt will be independent with advanced HEP    Time 6    Period Weeks    Status New    Target Date 05/17/20      PT LONG TERM GOAL #2   Title Pt will have WFL right shoulder ROM against gravity    Time 6    Period Weeks    Status New    Target Date 05/17/20      PT LONG TERM GOAL #3   Title Pt will be able to place cups into her  kitchen cabinet    Baseline Unable    Time 6    Period Weeks    Status New    Target Date 05/17/20      PT LONG TERM GOAL #4   Title Pt will report decrease in shoulder pain to </=5/10 at worst    Time 6    Period Weeks    Status New    Target Date 05/17/20      PT LONG TERM GOAL #5   Title Pt will have improved FOTO score to at least 40%    Baseline 55% limitation initially    Time 6    Period Weeks    Status New    Target Date 05/17/20                 Plan - 04/20/20 1011    Clinical Impression Statement Pt with report of less glenohumeral/upper shoulder pain. Pt with increased bicep tightness with s/s of ulner/median nerve radiculopathy. Upon full elbow extension, pt with non painful soft bulge along bicep tendon insertion -- consider possibility of MD follow up for cyst. Treatment focused on manual therapy for her right upper arm/biceps, stretching, strengthening of biceps/tricep and nerve glides. Pt tolerated treatment well with improved symptoms after session    Personal Factors and Comorbidities Age;Time since onset of injury/illness/exacerbation;Comorbidity 1;Comorbidity 2;Comorbidity 3+    Comorbidities arthritis, osteoporosis, HTN, recent CVA this year (pt unable to state what CVA affected), HTN    Examination-Activity Limitations Bathing;Bed Mobility;Carry;Hygiene/Grooming;Lift;Reach Overhead;Toileting    Examination-Participation Restrictions Church;Cleaning;Community Activity;Driving;Shop;[employer]    Stability/Clinical Decision Making Evolving/Moderate complexity    Rehab Potential Good    PT Frequency 2x / week    PT Duration 6 weeks    PT Treatment/Interventions ADLs/Self Care Home Management;Aquatic Therapy;Cryotherapy;Electrical Stimulation;Iontophoresis 4mg /ml Dexamethasone;Moist Heat;Traction;Ultrasound;DME Instruction;Functional mobility training;Therapeutic activities;Therapeutic exercise;Neuromuscular re-education;Patient/family education;Manual  techniques;Passive range of motion;Dry needling;Taping    PT Next Visit Plan Assess response to HEP. Continue manual and PROM as needed for R shoulder/upper arm. Progress  AROM and strengthening if able. Assess nerve glides and bicep/tricep stretching and strengthening -- consider adding ulner nerve glides.    PT Home Exercise Plan Access Code: [email or web address]. Table AAROM, row, scapular retraction, bicep stretch; median nerve glides    Consulted and Agree with Plan of Care Patient           Patient will benefit from skilled therapeutic intervention in order to improve the following deficits and impairments:  Decreased coordination, Decreased range of motion, Increased fascial restricitons, Increased muscle spasms, Impaired UE functional use, Pain, Impaired flexibility, Decreased mobility, Decreased strength, Postural dysfunction  Visit Diagnosis: Chronic right shoulder pain  Stiffness of right shoulder, not elsewhere classified  Muscle weakness (generalized)  Other muscle spasm     Problem List Patient Active Problem List   Diagnosis Date Noted  . Rotator cuff syndrome, right 01/11/2020  . Labile blood glucose   . Vascular headache   . Small vessel disease, cerebrovascular   . Prediabetes   . AKI (acute kidney injury) ([place])   . Right pontine stroke ([place]) 10/06/2019  . Acute cerebrovascular accident (CVA) ([place]) 09/30/2019  . Unilateral primary osteoarthritis, left knee 03/17/2019  . Unilateral primary osteoarthritis, right knee 03/17/2019  . Bilateral primary osteoarthritis of knee 03/10/2019  . INSOMNIA, CHRONIC 05/19/2007  . SPONDYLOSIS, LUMBAR 05/19/2007  . Essential hypertension 05/03/2007  . ARTHRITIS, KNEE 05/03/2007    [facility] [address] PT, DPT 04/20/2020, 10:33 AM  [facility] [address] [place], [place], [address] Phone: [phone]   Fax:  [phone]  Name: Alison Whitehead MRN: [id number] Date of  Birth: [birth year]

## 2020-04-23 ENCOUNTER — Ambulatory Visit: Payer: Medicare HMO | Admitting: Physical Therapy

## 2020-04-23 ENCOUNTER — Encounter: Payer: Self-pay | Admitting: Physical Therapy

## 2020-04-23 ENCOUNTER — Other Ambulatory Visit: Payer: Self-pay

## 2020-04-23 DIAGNOSIS — G8929 Other chronic pain: Secondary | ICD-10-CM

## 2020-04-23 DIAGNOSIS — M25511 Pain in right shoulder: Secondary | ICD-10-CM | POA: Diagnosis not present

## 2020-04-23 DIAGNOSIS — M6281 Muscle weakness (generalized): Secondary | ICD-10-CM

## 2020-04-23 DIAGNOSIS — M62838 Other muscle spasm: Secondary | ICD-10-CM

## 2020-04-23 DIAGNOSIS — M25611 Stiffness of right shoulder, not elsewhere classified: Secondary | ICD-10-CM

## 2020-04-23 NOTE — Therapy (Signed)
[place], [place], [address] Phone: [phone]   Fax:  [phone]  Physical Therapy Treatment  Patient Details  Name: Alison Whitehead MRN: [id number] Date of Birth: 1951-06-18 Referring Provider (PT): Meredith Staggers, MD   Encounter Date: 04/23/2020   PT End of Session - 04/23/20 1318    Visit Number 5    Number of Visits 12    Date for PT Re-Evaluation 05/17/20    Authorization Type [employer]    PT Start Time 3382    PT Stop Time 1405    PT Time Calculation (min) 47 min    Activity Tolerance Patient tolerated treatment well    Behavior During Therapy [facility] for tasks assessed/performed           Past Medical History:  Diagnosis Date  . Allergy    seasonal  . Arthritis   . DDD (degenerative disc disease)   . Degenerative disc disease, lumbar   . Hypertension   . IBS (irritable bowel syndrome)   . Osteopenia   . Osteoporosis    osteopenia  . Stroke [facility]) 09/2019    Past Surgical History:  Procedure Laterality Date  . SHOULDER SURGERY Left    lipoma    There were no vitals filed for this visit.   Subjective Assessment - 04/23/20 1320    Subjective Pt states her arm is feeling better. No pain noted in upper shoulder; most of it remains near her biceps region. Pt notes that the bulge near her elbow appears less.    Pertinent History arthritis    Limitations Lifting    How long can you sit comfortably? n/a    How long can you stand comfortably? Limited due to other factors (i.e. back pain/arthritis) unrelated to shoulder    How long can you walk comfortably? Limited due to other factors (i.e. back pain/arthritis) unrelated to shoulder    Patient Stated Goals Decrease pain    Currently in Pain? Yes    Pain Score 6     Pain Location Shoulder    Pain Onset More than a month ago                             [facility] Adult PT Treatment/Exercise - 04/23/20 0001       Shoulder Exercises: Supine   Flexion AAROM;Strengthening;Right;10 reps    Flexion Limitations Improved pain with manual assist    ABduction AAROM;Strengthening;Right;10 reps    ABduction Limitations Increased pain with eccentric motion -- improved with manual assist and cueing to stabilize her shoulder blade    Other Supine Exercises tricep 2x10 1 lb; bicep curl 2x10 2 lb; coracobrachilais 2x10 2 lb    Other Supine Exercises supine low level ulner nerve glide x10;       Shoulder Exercises: Seated   Other Seated Exercises scapular retraction x10      Hand Exercises for Cervical Radiculopathy   Other Hand Exercise for Cervical Radiculopathy median nerve glide x10    Other Hand Exercise for Cervical Radiculopathy ulner nerve butterfly x10      Manual Therapy   Soft tissue mobilization STW and trigger point release of bicep, mid deltoid, brachialis                    PT Short Term Goals - 04/23/20 1411      PT SHORT TERM GOAL #1   Title Pt  will be independent with initial HEP    Baseline Newly provided    Time 3    Period Weeks    Status Achieved    Target Date 04/26/20      PT SHORT TERM GOAL #2   Title Pt will be able to achieve at least 90 deg of right shoulder flexion and abduction in sitting    Baseline <45 deg in AROM due to pain    Time 3    Period Weeks    Status On-going    Target Date 04/26/20      PT SHORT TERM GOAL #3   Title Pt will report pain </=8/10 at worst for improved QoL    Time 3    Period Weeks    Status Achieved    Target Date 04/26/20      PT SHORT TERM GOAL #4   Title Pt will report 50% improvement in her ability to perform dressing and bathing tasks    Baseline Pt states she is able to perform these; however, with a lot of pain    Time 3    Period Weeks    Status On-going    Target Date 04/26/20             PT Long Term Goals - 04/05/20 1332      PT LONG TERM GOAL #1   Title Pt will be independent with advanced HEP     Time 6    Period Weeks    Status New    Target Date 05/17/20      PT LONG TERM GOAL #2   Title Pt will have WFL right shoulder ROM against gravity    Time 6    Period Weeks    Status New    Target Date 05/17/20      PT LONG TERM GOAL #3   Title Pt will be able to place cups into her kitchen cabinet    Baseline Unable    Time 6    Period Weeks    Status New    Target Date 05/17/20      PT LONG TERM GOAL #4   Title Pt will report decrease in shoulder pain to </=5/10 at worst    Time 6    Period Weeks    Status New    Target Date 05/17/20      PT LONG TERM GOAL #5   Title Pt will have improved FOTO score to at least 40%    Baseline 55% limitation initially    Time 6    Period Weeks    Status New    Target Date 05/17/20                 Plan - 04/23/20 1414    Clinical Impression Statement Despite pt with less GH pain -- pt remains weak against gravity. Initiated increasing strengthening of shoulder girdle in supine due to pain/poor mechanics in seated. Continued to provide manual therapy, stretnthening,stretching, as well as nerve gliding for pt's mid upper arm pain. Nerve glide demonstrates pt with increased dysfunction in ulner nerve vs median nerve. Pt notes good improvement after changing her sleeping/TV watching position. Discussed with pt changing to 1x/wk due to copay.    Personal Factors and Comorbidities Age;Time since onset of injury/illness/exacerbation;Comorbidity 1;Comorbidity 2;Comorbidity 3+    Comorbidities arthritis, osteoporosis, HTN, recent CVA this year (pt unable to state what CVA affected), HTN    Examination-Activity Limitations Bathing;Bed Mobility;Carry;Hygiene/Grooming;Lift;Reach Overhead;Toileting  Examination-Participation Restrictions Church;Cleaning;Community Activity;Driving;Shop;[employer]    Stability/Clinical Decision Making Evolving/Moderate complexity    Rehab Potential Good    PT Frequency 2x / week    PT Duration 6 weeks     PT Treatment/Interventions ADLs/Self Care Home Management;Aquatic Therapy;Cryotherapy;Electrical Stimulation;Iontophoresis 4mg /ml Dexamethasone;Moist Heat;Traction;Ultrasound;DME Instruction;Functional mobility training;Therapeutic activities;Therapeutic exercise;Neuromuscular re-education;Patient/family education;Manual techniques;Passive range of motion;Dry needling;Taping    PT Next Visit Plan Assess response to HEP. Continue manual and PROM as needed for R shoulder/upper arm. Progress AROM and strengthening if able of shoulder girdle. Assess ulner nerve glides and bicep/tricep stretching and strengthening.    PT Home Exercise Plan Access Code: [email or web address]. Table AAROM, row, scapular retraction, bicep stretch; median nerve glides & ulner nerve glides    Consulted and Agree with Plan of Care Patient           Patient will benefit from skilled therapeutic intervention in order to improve the following deficits and impairments:  Decreased coordination, Decreased range of motion, Increased fascial restricitons, Increased muscle spasms, Impaired UE functional use, Pain, Impaired flexibility, Decreased mobility, Decreased strength, Postural dysfunction  Visit Diagnosis: Chronic right shoulder pain  Stiffness of right shoulder, not elsewhere classified  Muscle weakness (generalized)  Other muscle spasm     Problem List Patient Active Problem List   Diagnosis Date Noted  . Rotator cuff syndrome, right 01/11/2020  . Labile blood glucose   . Vascular headache   . Small vessel disease, cerebrovascular   . Prediabetes   . AKI (acute kidney injury) ([place])   . Right pontine stroke ([place]) 10/06/2019  . Acute cerebrovascular accident (CVA) ([place]) 09/30/2019  . Unilateral primary osteoarthritis, left knee 03/17/2019  . Unilateral primary osteoarthritis, right knee 03/17/2019  . Bilateral primary osteoarthritis of knee 03/10/2019  . INSOMNIA, CHRONIC 05/19/2007  . SPONDYLOSIS, LUMBAR 05/19/2007   . Essential hypertension 05/03/2007  . ARTHRITIS, KNEE 05/03/2007    [facility] [address] PT, DPT 04/23/2020, 2:19 PM  [facility] [address] [place], [place], [address] Phone: [phone]   Fax:  [phone]  Name: Alison Whitehead MRN: [id number] Date of Birth: [birth year]

## 2020-04-25 ENCOUNTER — Ambulatory Visit: Payer: Medicare HMO | Admitting: Physical Therapy

## 2020-04-25 ENCOUNTER — Encounter: Payer: Medicare HMO | Admitting: Physical Therapy

## 2020-05-02 ENCOUNTER — Ambulatory Visit: Payer: Medicare HMO | Admitting: Physical Therapy

## 2020-05-02 ENCOUNTER — Other Ambulatory Visit: Payer: Self-pay

## 2020-05-02 ENCOUNTER — Encounter: Payer: Self-pay | Admitting: Physical Therapy

## 2020-05-02 DIAGNOSIS — M25511 Pain in right shoulder: Secondary | ICD-10-CM | POA: Diagnosis not present

## 2020-05-02 DIAGNOSIS — M6281 Muscle weakness (generalized): Secondary | ICD-10-CM

## 2020-05-02 DIAGNOSIS — G8929 Other chronic pain: Secondary | ICD-10-CM

## 2020-05-02 DIAGNOSIS — M25611 Stiffness of right shoulder, not elsewhere classified: Secondary | ICD-10-CM

## 2020-05-02 DIAGNOSIS — M62838 Other muscle spasm: Secondary | ICD-10-CM

## 2020-05-02 NOTE — Therapy (Signed)
[place], [place], [address] Phone: [phone]   Fax:  [phone]  Physical Therapy Treatment  Patient Details  Name: Alison Whitehead MRN: [id number] Date of Birth: 05/20/1951 Referring Provider (PT): Meredith Staggers, MD   Encounter Date: 05/02/2020   PT End of Session - 05/02/20 1359    Visit Number 6    Number of Visits 12    Date for PT Re-Evaluation 05/17/20    Authorization Type [employer] Medicare    Progress Note Due on Visit 10    PT Start Time 1316    PT Stop Time 1400    PT Time Calculation (min) 44 min    Activity Tolerance Patient tolerated treatment well    Behavior During Therapy [facility] for tasks assessed/performed           Past Medical History:  Diagnosis Date  . Allergy    seasonal  . Arthritis   . DDD (degenerative disc disease)   . Degenerative disc disease, lumbar   . Hypertension   . IBS (irritable bowel syndrome)   . Osteopenia   . Osteoporosis    osteopenia  . Stroke [facility]) 09/2019    Past Surgical History:  Procedure Laterality Date  . SHOULDER SURGERY Left    lipoma    There were no vitals filed for this visit.   Subjective Assessment - 05/02/20 1323    Subjective Pt states her arm is feeling much better. Some mild pain along biceps.    Pertinent History arthritis    Limitations Lifting    How long can you sit comfortably? n/a    How long can you stand comfortably? Limited due to other factors (i.e. back pain/arthritis) unrelated to shoulder    How long can you walk comfortably? Limited due to other factors (i.e. back pain/arthritis) unrelated to shoulder    Patient Stated Goals Decrease pain    Currently in Pain? Yes    Pain Score 4     Pain Location Shoulder    Pain Orientation Right    Pain Descriptors / Indicators Aching    Pain Onset More than a month ago                             [facility] Adult PT Treatment/Exercise -  05/02/20 0001      Shoulder Exercises: Supine   ABduction AAROM;Strengthening;Right;10 reps    Other Supine Exercises ulner nerve butterfly x10, skull basher 1x10 no weight 1x10 with 2#s, ulner nerve flossing x10    Other Supine Exercises bicep curl 2x10 with 2#; neck retraction x10, neck retraction with shoulder abduction x 10, serratus punches x10 with elbow extended      Shoulder Exercises: Seated   Retraction Strengthening;Both;10 reps    Other Seated Exercises attempted ulner nerve butterfly stretch and ulner nerve mobilization - low level in seated; however, pt unable to tolerate      Shoulder Exercises: ROM/Strengthening   Other ROM/Strengthening Exercises wall slide flexion x10, wall slide abduction x 10   cues to extend elbow; but pt with increased pain                   PT Short Term Goals - 05/02/20 1410      PT SHORT TERM GOAL #1   Title Pt will be independent with initial HEP    Baseline Newly provided    Time 3  Period Weeks    Status Achieved    Target Date 04/26/20      PT SHORT TERM GOAL #2   Title Pt will be able to achieve at least 90 deg of right shoulder flexion and abduction in sitting    Baseline Able to achieve 90 deg of shoulder flexion/abduction; however, needs slight elbow bend    Time 3    Period Weeks    Status Partially Met    Target Date 04/26/20      PT SHORT TERM GOAL #3   Title Pt will report pain </=8/10 at worst for improved QoL    Time 3    Period Weeks    Status Achieved    Target Date 04/26/20      PT SHORT TERM GOAL #4   Title Pt will report 50% improvement in her ability to perform dressing and bathing tasks    Baseline Pt states she is able to perform these; however, with a lot of pain    Time 3    Period Weeks    Status On-going    Target Date 04/26/20             PT Long Term Goals - 04/05/20 1332      PT LONG TERM GOAL #1   Title Pt will be independent with advanced HEP    Time 6    Period Weeks     Status New    Target Date 05/17/20      PT LONG TERM GOAL #2   Title Pt will have WFL right shoulder ROM against gravity    Time 6    Period Weeks    Status New    Target Date 05/17/20      PT LONG TERM GOAL #3   Title Pt will be able to place cups into her kitchen cabinet    Baseline Unable    Time 6    Period Weeks    Status New    Target Date 05/17/20      PT LONG TERM GOAL #4   Title Pt will report decrease in shoulder pain to </=5/10 at worst    Time 6    Period Weeks    Status New    Target Date 05/17/20      PT LONG TERM GOAL #5   Title Pt will have improved FOTO score to at least 40%    Baseline 55% limitation initially    Time 6    Period Weeks    Status New    Target Date 05/17/20                 Plan - 05/02/20 1411    Clinical Impression Statement Pt with continued decrease in right shoulder/arm pain. Unable to perform nerve gliding in seated position due to pt with weak shoulder/scapular stabilizers against gravity and utilizing poor scapulohumeral rhythm. Shoulder ROM appears limited due to increased ulner nerve neural tension and decreased shoulder/scapular strength. Session focused on progressing pt's ulner nerve stretching and shoulder strengthening/AROM in supine as this has been the position she has best been able to tolerate movement. Pt is able to raise shoulder to 90 deg elevation against gravity; however, she is unable to perform this without keeping her elbow slightly bent.    Personal Factors and Comorbidities Age;Time since onset of injury/illness/exacerbation;Comorbidity 1;Comorbidity 2;Comorbidity 3+    Comorbidities arthritis, osteoporosis, HTN, recent CVA this year (pt unable to state what CVA affected), HTN  Examination-Activity Limitations Bathing;Bed Mobility;Carry;Hygiene/Grooming;Lift;Reach Overhead;Toileting    Examination-Participation Restrictions Church;Cleaning;Community Activity;Driving;Shop;[employer]     Stability/Clinical Decision Making Evolving/Moderate complexity    Rehab Potential Good    PT Frequency 2x / week    PT Duration 6 weeks    PT Treatment/Interventions ADLs/Self Care Home Management;Aquatic Therapy;Cryotherapy;Electrical Stimulation;Iontophoresis 48m/ml Dexamethasone;Moist Heat;Traction;Ultrasound;DME Instruction;Functional mobility training;Therapeutic activities;Therapeutic exercise;Neuromuscular re-education;Patient/family education;Manual techniques;Passive range of motion;Dry needling;Taping    PT Next Visit Plan Perform FOTO!!! Assess response to HEP. Progress AROM and strengthening of shoulder girdle/RTC. Continue ulner nerve glides and bicep/tricep stretching and strengthening. Progress nerve glides to seated if pt is able to tolerate. Consider addition of neck exercises/stabilization    PT Home Exercise Plan Access Code: [email or web address] Table AAROM, row, scapular retraction, bicep stretch; ulner nerve glides in supine and seated as tolerated    Consulted and Agree with Plan of Care Patient           Patient will benefit from skilled therapeutic intervention in order to improve the following deficits and impairments:  Decreased coordination, Decreased range of motion, Increased fascial restricitons, Increased muscle spasms, Impaired UE functional use, Pain, Impaired flexibility, Decreased mobility, Decreased strength, Postural dysfunction  Visit Diagnosis: Chronic right shoulder pain  Stiffness of right shoulder, not elsewhere classified  Muscle weakness (generalized)  Other muscle spasm     Problem List Patient Active Problem List   Diagnosis Date Noted  . Rotator cuff syndrome, right 01/11/2020  . Labile blood glucose   . Vascular headache   . Small vessel disease, cerebrovascular   . Prediabetes   . AKI [place]acute kidney injury[place] [employer]   . [place] 10[place]  . [place]  . Unilateral primary  osteoarthritis, left knee 03/17/2019  . Unilateral primary osteoarthritis, right knee 03/17/2019  . Bilateral primary osteoarthritis of knee 03/10/2019  . INSOMNIA, CHRONIC 05/19/2007  . SPONDYLOSIS, LUMBAR 05/19/2007  . Essential hypertension 05/03/2007  . ARTHRITIS, KNEE 05/03/2007    [facility]PT, DPT 05/02/2020, 2:22 PM  [facility] [address]Phone: [phone]  Fax:  [phone] Name: Alison WhistlerMRN: [id number] of Birth: 1Sep 05, 1952

## 2020-05-08 ENCOUNTER — Ambulatory Visit: Payer: Medicare HMO | Attending: Physical Medicine & Rehabilitation

## 2020-05-08 ENCOUNTER — Other Ambulatory Visit: Payer: Self-pay

## 2020-05-08 DIAGNOSIS — M25611 Stiffness of right shoulder, not elsewhere classified: Secondary | ICD-10-CM | POA: Diagnosis present

## 2020-05-08 DIAGNOSIS — M25511 Pain in right shoulder: Secondary | ICD-10-CM | POA: Insufficient documentation

## 2020-05-08 DIAGNOSIS — M62838 Other muscle spasm: Secondary | ICD-10-CM | POA: Diagnosis present

## 2020-05-08 DIAGNOSIS — M6281 Muscle weakness (generalized): Secondary | ICD-10-CM | POA: Diagnosis present

## 2020-05-08 DIAGNOSIS — G8929 Other chronic pain: Secondary | ICD-10-CM | POA: Diagnosis present

## 2020-05-08 NOTE — Therapy (Signed)
[place], [place], [address] Phone: [phone]   Fax:  [phone]  Physical Therapy Treatment  Patient Details  Name: Alison Whitehead MRN: [id number] Date of Birth: Feb 20, 1951 Referring Provider (PT): Meredith Staggers, MD   Encounter Date: 05/08/2020   PT End of Session - 05/08/20 1341    Visit Number 7    Number of Visits 12    Date for PT Re-Evaluation 05/17/20    Authorization Type [employer]    Progress Note Due on Visit 10    PT Start Time 1335    PT Stop Time 1415    PT Time Calculation (min) 40 min    Activity Tolerance Patient tolerated treatment well    Behavior During Therapy [facility] for tasks assessed/performed           Past Medical History:  Diagnosis Date  . Allergy    seasonal  . Arthritis   . DDD (degenerative disc disease)   . Degenerative disc disease, lumbar   . Hypertension   . IBS (irritable bowel syndrome)   . Osteopenia   . Osteoporosis    osteopenia  . Stroke [facility]) 09/2019    Past Surgical History:  Procedure Laterality Date  . SHOULDER SURGERY Left    lipoma    There were no vitals filed for this visit.   Subjective Assessment - 05/08/20 1340    Subjective Pt reports her shoulder has gotten better, and it's better than it was last week. Exercises are helping.    Pertinent History arthritis    Limitations Lifting    How long can you sit comfortably? n/a    How long can you stand comfortably? Limited due to other factors (i.e. back pain/arthritis) unrelated to shoulder    How long can you walk comfortably? Limited due to other factors (i.e. back pain/arthritis) unrelated to shoulder    Patient Stated Goals Decrease pain    Currently in Pain? No/denies    Pain Onset More than a month ago              [facility] PT Assessment - 05/08/20 0001      Observation/Other Assessments   Focus on Therapeutic Outcomes (FOTO)  50% limitation                          [facility] Adult PT Treatment/Exercise - 05/08/20 0001      Shoulder Exercises: Supine   Shoulder ABduction Weight (lbs) unable to tolerate and c/o pain for last 10 min of session attempting 1 rep of ABD    Other Supine Exercises bicep curl 2x10 with 2#; neck retraction x10, neck retraction with shoulder abduction x 10, serratus punches x10 with elbow extended      Shoulder Exercises: Seated   Retraction Strengthening;Both;10 reps      Manual Therapy   Manual Therapy Soft tissue mobilization    Soft tissue mobilization STM/XFM to biceps and tendon in various positions                  PT Education - 05/08/20 1430    Education Details Discussed considering alternative options MD is offering secondary to continued intense pain in R shoulder that lasts for awhile after attempting supine abduction to 90 with bodyweight. EDU on FOTO.    Person(s) Educated Patient    Methods Explanation    Comprehension Verbalized understanding;Need further instruction  PT Short Term Goals - 05/02/20 1410      PT SHORT TERM GOAL #1   Title Pt will be independent with initial HEP    Baseline Newly provided    Time 3    Period Weeks    Status Achieved    Target Date 04/26/20      PT SHORT TERM GOAL #2   Title Pt will be able to achieve at least 90 deg of right shoulder flexion and abduction in sitting    Baseline Able to achieve 90 deg of shoulder flexion/abduction; however, needs slight elbow bend    Time 3    Period Weeks    Status Partially Met    Target Date 04/26/20      PT SHORT TERM GOAL #3   Title Pt will report pain </=8/10 at worst for improved QoL    Time 3    Period Weeks    Status Achieved    Target Date 04/26/20      PT SHORT TERM GOAL #4   Title Pt will report 50% improvement in her ability to perform dressing and bathing tasks    Baseline Pt states she is able to perform these; however, with a lot of pain    Time 3    Period  Weeks    Status On-going    Target Date 04/26/20             PT Long Term Goals - 04/05/20 1332      PT LONG TERM GOAL #1   Title Pt will be independent with advanced HEP    Time 6    Period Weeks    Status New    Target Date 05/17/20      PT LONG TERM GOAL #2   Title Pt will have WFL right shoulder ROM against gravity    Time 6    Period Weeks    Status New    Target Date 05/17/20      PT LONG TERM GOAL #3   Title Pt will be able to place cups into her kitchen cabinet    Baseline Unable    Time 6    Period Weeks    Status New    Target Date 05/17/20      PT LONG TERM GOAL #4   Title Pt will report decrease in shoulder pain to </=5/10 at worst    Time 6    Period Weeks    Status New    Target Date 05/17/20      PT LONG TERM GOAL #5   Title Pt will have improved FOTO score to at least 40%    Baseline 55% limitation initially    Time 6    Period Weeks    Status New    Target Date 05/17/20                 Plan - 05/08/20 1511    Clinical Impression Statement Pt with poor tolerance for any supine abduction today with elbow bent or straight, c/o pain at end of session for last 10 or so minutes. Pt did tolerate supine serratus punches increasing to 3#, as well as biceps hammer curls with 3# and no c/o incr in pain. Performed STM to biceps in 90, 45 and 10 with good tolerance.    Personal Factors and Comorbidities Age;Time since onset of injury/illness/exacerbation;Comorbidity 1;Comorbidity 2;Comorbidity 3+    Comorbidities arthritis, osteoporosis, HTN, recent CVA this year (pt unable to  state what CVA affected), HTN    Examination-Activity Limitations Bathing;Bed Mobility;Carry;Hygiene/Grooming;Lift;Reach Overhead;Toileting    Examination-Participation Restrictions Church;Cleaning;Community Activity;Driving;Shop;[employer]    Stability/Clinical Decision Making Evolving/Moderate complexity    Rehab Potential Good    PT Frequency 2x / week    PT  Duration 6 weeks    PT Treatment/Interventions ADLs/Self Care Home Management;Aquatic Therapy;Cryotherapy;Electrical Stimulation;Iontophoresis 4mg /ml Dexamethasone;Moist Heat;Traction;Ultrasound;DME Instruction;Functional mobility training;Therapeutic activities;Therapeutic exercise;Neuromuscular re-education;Patient/family education;Manual techniques;Passive range of motion;Dry needling;Taping    PT Next Visit Plan Assess response to HEP. Progress AROM and strengthening of shoulder girdle/RTC. Continue ulnar nerve glides and bicep/tricep stretching and strengthening. Progress nerve glides to seated if pt is able to tolerate. Consider addition of neck exercises/stabilization    PT Home Exercise Plan Access Code: [email or web address]. Table AAROM, row, scapular retraction, bicep stretch; ulner nerve glides in supine and seated as tolerated    Consulted and Agree with Plan of Care Patient           Patient will benefit from skilled therapeutic intervention in order to improve the following deficits and impairments:  Decreased coordination, Decreased range of motion, Increased fascial restricitons, Increased muscle spasms, Impaired UE functional use, Pain, Impaired flexibility, Decreased mobility, Decreased strength, Postural dysfunction  Visit Diagnosis: Chronic right shoulder pain  Stiffness of right shoulder, not elsewhere classified  Muscle weakness (generalized)  Other muscle spasm     Problem List Patient Active Problem List   Diagnosis Date Noted  . Rotator cuff syndrome, right 01/11/2020  . Labile blood glucose   . Vascular headache   . Small vessel disease, cerebrovascular   . Prediabetes   . AKI (acute kidney injury) ([place])   . Right pontine stroke ([place]) 10/06/2019  . Acute cerebrovascular accident (CVA) ([place]) 09/30/2019  . Unilateral primary osteoarthritis, left knee 03/17/2019  . Unilateral primary osteoarthritis, right knee 03/17/2019  . Bilateral primary osteoarthritis of knee  03/10/2019  . INSOMNIA, CHRONIC 05/19/2007  . SPONDYLOSIS, LUMBAR 05/19/2007  . Essential hypertension 05/03/2007  . ARTHRITIS, KNEE 05/03/2007    Izell North Wantagh, PT, DPT 05/08/2020, 3:14 PM  [facility] [address] [place], [place], [address] Phone: [phone]   Fax:  [phone]  Name: Alison Whitehead MRN: [id number] Date of Birth: 08/23/1950

## 2020-05-14 ENCOUNTER — Other Ambulatory Visit (HOSPITAL_COMMUNITY): Payer: Self-pay | Admitting: Cardiovascular Disease

## 2020-05-15 ENCOUNTER — Emergency Department (HOSPITAL_COMMUNITY): Payer: Medicare HMO

## 2020-05-15 ENCOUNTER — Emergency Department (HOSPITAL_COMMUNITY)
Admission: EM | Admit: 2020-05-15 | Discharge: 2020-05-15 | Disposition: A | Discharge status: Home / Self Care | Payer: Medicare HMO | Attending: Emergency Medicine | Admitting: Emergency Medicine

## 2020-05-15 ENCOUNTER — Encounter (HOSPITAL_COMMUNITY): Payer: Self-pay | Admitting: Emergency Medicine

## 2020-05-15 ENCOUNTER — Other Ambulatory Visit: Payer: Self-pay

## 2020-05-15 ENCOUNTER — Ambulatory Visit: Payer: Medicare HMO

## 2020-05-15 ENCOUNTER — Other Ambulatory Visit (HOSPITAL_COMMUNITY): Payer: Self-pay | Admitting: Family Medicine

## 2020-05-15 DIAGNOSIS — R55 Syncope and collapse: Secondary | ICD-10-CM | POA: Diagnosis not present

## 2020-05-15 DIAGNOSIS — Z87891 Personal history of nicotine dependence: Secondary | ICD-10-CM | POA: Diagnosis not present

## 2020-05-15 DIAGNOSIS — R519 Headache, unspecified: Secondary | ICD-10-CM | POA: Diagnosis not present

## 2020-05-15 DIAGNOSIS — R42 Dizziness and giddiness: Secondary | ICD-10-CM | POA: Diagnosis not present

## 2020-05-15 DIAGNOSIS — I10 Essential (primary) hypertension: Secondary | ICD-10-CM | POA: Diagnosis not present

## 2020-05-15 DIAGNOSIS — R110 Nausea: Secondary | ICD-10-CM | POA: Insufficient documentation

## 2020-05-15 DIAGNOSIS — N179 Acute kidney failure, unspecified: Secondary | ICD-10-CM | POA: Diagnosis not present

## 2020-05-15 DIAGNOSIS — Z7982 Long term (current) use of aspirin: Secondary | ICD-10-CM | POA: Insufficient documentation

## 2020-05-15 LAB — BASIC METABOLIC PANEL
Anion gap: 10 (ref 5–15)
BUN: 38 mg/dL — ABNORMAL HIGH (ref 8–23)
CO2: 18 mmol/L — ABNORMAL LOW (ref 22–32)
Calcium: 9 mg/dL (ref 8.9–10.3)
Chloride: 111 mmol/L (ref 98–111)
Creatinine, Ser: 1.65 mg/dL — ABNORMAL HIGH (ref 0.44–1.00)
GFR, Estimated: 32 mL/min — ABNORMAL LOW (ref >60)
Glucose, Bld: 107 mg/dL — ABNORMAL HIGH (ref 70–99)
Potassium: 4.8 mmol/L (ref 3.5–5.1)
Sodium: 139 mmol/L (ref 135–145)

## 2020-05-15 LAB — URINALYSIS, ROUTINE W REFLEX MICROSCOPIC
Bilirubin Urine: NEGATIVE
Glucose, UA: NEGATIVE mg/dL
Hgb urine dipstick: NEGATIVE
Ketones, ur: NEGATIVE mg/dL
Leukocytes,Ua: NEGATIVE
Nitrite: NEGATIVE
Protein, ur: NEGATIVE mg/dL
Specific Gravity, Urine: 1.019 (ref 1.005–1.030)
pH: 6 (ref 5.0–8.0)

## 2020-05-15 LAB — CBC
HCT: 37.9 % (ref 36.0–46.0)
Hemoglobin: 11.1 g/dL — ABNORMAL LOW (ref 12.0–15.0)
MCH: 27.6 pg (ref 26.0–34.0)
MCHC: 29.3 g/dL — ABNORMAL LOW (ref 30.0–36.0)
MCV: 94.3 fL (ref 80.0–100.0)
Platelets: 221 10*3/uL (ref 150–400)
RBC: 4.02 MIL/uL (ref 3.87–5.11)
RDW: 14.2 % (ref 11.5–15.5)
WBC: 4.9 10*3/uL (ref 4.0–10.5)
nRBC: 0 % (ref 0.0–0.2)

## 2020-05-15 MED ORDER — SODIUM CHLORIDE 0.9 % IV BOLUS
1000.0000 mL | Freq: Once | INTRAVENOUS | Status: AC
  Administered 2020-05-15: 1000 mL via INTRAVENOUS

## 2020-05-15 MED ORDER — KETOROLAC TROMETHAMINE 30 MG/ML IJ SOLN: 15.0000 mg | Freq: Once | INTRAMUSCULAR | Status: DC

## 2020-05-15 MED ORDER — METOCLOPRAMIDE HCL 5 MG/ML IJ SOLN
10.0000 mg | Freq: Once | INTRAMUSCULAR | Status: AC
  Administered 2020-05-15: 10 mg via INTRAVENOUS
  Filled 2020-05-15: qty 2

## 2020-05-15 NOTE — ED Triage Notes (Signed)
Pt arrives to ED with complaints of sudden onset of headache, nausea, dizziness, and right arm pain this morning at 1030. Per pt the nausea and dizziness have subsided but the headache persist. Pt has no weakness, speech or sensory deficits.

## 2020-05-15 NOTE — Discharge Instructions (Addendum)
You were seen in the emergency department for headache nausea and dizziness.  You had a CAT scan that did not show any obvious new findings.  Your blood work showed you to be dehydrated.  You were given some IV fluids and some medication for your headache.  You were offered an MRI of your brain which you declined.  Please contact your primary care doctor for close follow-up and repeat lab work to make sure your kidney function is improving.  Please also schedule a follow-up appointment with your neurologist.  Return to the emergency department if any worsening or concerning symptoms.

## 2020-05-15 NOTE — ED Notes (Signed)
Provided pt w/bagged lunch and ice water.

## 2020-05-15 NOTE — ED Provider Notes (Addendum)
[facility] EMERGENCY DEPARTMENT Provider Note   CSN: [id number] Arrival date & time: 05/15/20  1157     History Chief Complaint  Patient presents with   Headache   Dizziness    Alison Whitehead is a 69 y.o. female.  She said she started with headache this morning.  History of headaches but she said this 1 was worse.  Not sudden onset.  Was associated with feeling dizzy lightheaded but also or unsteady.  Made her feel nauseous.  S all of her symptoms have resolved, except for a headache which she says is mild now.  She says these are similar to when she had a stroke back in the spring.  She called her doctor and they recommended she come here for evaluation.  Prior stroke did not affect her arms or legs.  The history is provided by the patient.  Headache Pain location:  Generalized Radiates to:  Does not radiate Severity currently:  4/10 Severity at highest:  10/10 Onset quality:  Gradual Timing:  Constant Progression:  Partially resolved Chronicity:  Recurrent Similar to prior headaches: yes   Relieved by:  None tried Worsened by:  Nothing Ineffective treatments:  None tried Associated symptoms: dizziness, nausea and near-syncope   Associated symptoms: no abdominal pain, no blurred vision, no cough, no diarrhea, no facial pain, no fever, no focal weakness, no hearing loss, no myalgias, no neck stiffness, no numbness, no paresthesias, no photophobia, no syncope, no tingling, no visual change, no vomiting and no weakness   Dizziness Associated symptoms: headaches and nausea   Associated symptoms: no chest pain, no diarrhea, no hearing loss, no syncope, no vision changes, no vomiting and no weakness        Past Medical History:  Diagnosis Date   Allergy    seasonal   Arthritis    DDD (degenerative disc disease)    Degenerative disc disease, lumbar    Hypertension    IBS (irritable bowel syndrome)    Osteopenia    Osteoporosis     osteopenia   Stroke ([place]) 09/2019    Patient Active Problem List   Diagnosis Date Noted   Rotator cuff syndrome, right 01/11/2020   Labile blood glucose    Vascular headache    Small vessel disease, cerebrovascular    Prediabetes    AKI (acute kidney injury) ([place])    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     Family History  Problem Relation Age of Onset   Breast cancer Maternal Aunt    Diabetes Mother    Hypertension Mother    Glaucoma Mother    Other Father        accident   Diabetes Sister    Hypertension Sister    Cancer Sister    Colon cancer Neg Hx    Colon polyps Neg Hx    Esophageal cancer Neg Hx    Stomach cancer Neg Hx    Rectal cancer Neg Hx     Social History  Tobacco Use   Smoking status: Former Smoker    Quit date: 10/13/2019    Years since quitting: 0.5   Smokeless tobacco: Never Used  Vaping Use   Vaping Use: Never used  Substance Use Topics   Alcohol use: Never   Drug use: Never    Home Medications Prior to Admission medications   Medication Sig Start Date End Date Taking? Authorizing Provider  acetaminophen (TYLENOL) 325 MG tablet Take 1-2 tablets (325-650 mg total) by mouth every 4 (four) hours as needed for mild pain. 10/17/19   Love, Ivan Anchors, PA-C  amLODipine (NORVASC) 10 MG tablet Take 1 tablet (10 mg total) by mouth daily. 10/29/19   Love, Ivan Anchors, PA-C  aspirin EC 81 MG tablet Take 1 tablet (81 mg total) by  mouth daily. 12/13/19   Penumalli, Earlean Polka, MD  atorvastatin (LIPITOR) 40 MG tablet Take 1 tablet (40 mg total) by mouth daily at 6 PM. 10/28/19   Love, Ivan Anchors, PA-C  carvedilol (COREG) 6.25 MG tablet Take 1 tablet (6.25 mg total) by mouth 2 (two) times daily with a meal. 10/28/19   Love, Ivan Anchors, PA-C  cetirizine (ZYRTEC) 10 MG tablet Take 1 tablet (10 mg total) by mouth at bedtime. 10/28/19   Love, Ivan Anchors, PA-C  diclofenac sodium (VOLTAREN) 1 % GEL Apply 2 g topically 4 (four) times daily. 10/28/19   Love, Ivan Anchors, PA-C  gabapentin (NEURONTIN) 100 MG capsule Take 100 mg by mouth 3 (three) times daily.    [provider]  HYDROcodone-acetaminophen (NORCO) 10-325 MG tablet Take 1 tablet by mouth every 8 (eight) hours as needed.    [provider]  lisinopril (ZESTRIL) 20 MG tablet Take 1 tablet (20 mg total) by mouth daily. 11/16/19   Meredith Staggers, MD  meclizine (ANTIVERT) 12.5 MG tablet Take 1 tablet (12.5 mg total) by mouth 3 (three) times daily as needed for dizziness. 10/28/19   Love, Ivan Anchors, PA-C  melatonin 3 MG TABS tablet Take 2 tablets (6 mg total) by mouth at bedtime. 10/28/19   Love, Ivan Anchors, PA-C  Menthol-Methyl Salicylate (MUSCLE RUB) 10-15 % CREA Apply 1 application topically 2 (two) times daily as needed for muscle pain. To bilateral knees 10/17/19   Love, Ivan Anchors, PA-C  Multiple Vitamin (MULTIVITAMIN WITH MINERALS) TABS tablet Take 1 tablet by mouth daily. 10/18/19   Love, Ivan Anchors, PA-C  naphazoline-glycerin (CLEAR EYES REDNESS) 0.012-0.2 % SOLN Place 1-2 drops into both eyes 4 (four) times daily as needed for eye irritation. 10/28/19   Love, Ivan Anchors, PA-C  polyethylene glycol (MIRALAX / GLYCOLAX) 17 g packet Take 17 g by mouth daily. 10/29/19   Love, Ivan Anchors, PA-C  scopolamine (TRANSDERM-SCOP) 1 MG/3DAYS Place 1 patch (1.5 mg total) onto the skin every 3 (three) days. Start on Sunday 10/29/19   Love, Ivan Anchors, PA-C  senna-docusate (SENOKOT-S) 8.6-50 MG tablet  Take 2 tablets by mouth at bedtime. 10/28/19   Love, Ivan Anchors, PA-C  topiramate (TOPAMAX) 50 MG tablet Take 1 tablet (50 mg total) by mouth 2 (two) times daily. 10/28/19   Love, Ivan Anchors, PA-C  triamterene-hydrochlorothiazide (MAXZIDE-25) 37.5-25 MG tablet Take 1 tablet by mouth daily.    [provider]  verapamil (CALAN-SR) 240 MG CR tablet Take 240 mg by mouth at bedtime.    [provider]  Wheat Dextrin (BENEFIBER PO) Take by mouth.    [provider]    Allergies    Penicillins  Review of Systems   Review of Systems  Constitutional: Negative for fever.  HENT: Negative for hearing loss.   Eyes: Negative for blurred vision and photophobia.  Respiratory: Negative for cough.   Cardiovascular: Positive for near-syncope. Negative for chest pain and syncope.  Gastrointestinal: Positive for nausea. Negative for abdominal pain, diarrhea and vomiting.  Genitourinary: Negative for frequency.  Musculoskeletal: Negative for myalgias and neck stiffness.  Skin: Negative for rash.  Neurological: Positive for dizziness, light-headedness and headaches. Negative for focal weakness, syncope, facial asymmetry, speech difficulty, weakness, numbness and paresthesias.    Physical Exam Updated Vital Signs BP (!) 125/98    Pulse (!) 54    Temp 98 F (36.7 C) (Oral)    Resp (!) 23    Ht 5\' 7"  (1.702 m)    Wt 90.7 kg    SpO2 (!) 77%    BMI 31.32 kg/m   Physical Exam Vitals and nursing note reviewed.  Constitutional:      General: She is not in acute distress.    Appearance: Normal appearance. She is well-developed.  HENT:     Head: Normocephalic and atraumatic.  Eyes:     Extraocular Movements: Extraocular movements intact.     Conjunctiva/sclera: Conjunctivae normal.     Pupils: Pupils are equal, round, and reactive to light.  Cardiovascular:     Rate and Rhythm: Normal rate and regular rhythm.     Pulses: Normal pulses.     Heart sounds: No murmur heard.    Pulmonary:     Effort: Pulmonary effort is normal. No respiratory distress.     Breath sounds: Normal breath sounds.  Abdominal:     Palpations: Abdomen is soft.     Tenderness: There is no abdominal tenderness. There is no guarding or rebound.  Musculoskeletal:        General: No deformity or signs of injury. Normal range of motion.     Cervical back: Neck supple.  Skin:    General: Skin is warm and dry.     Capillary Refill: Capillary refill takes less than 2 seconds.  Neurological:     General: No focal deficit present.     Mental Status: She is alert.     GCS: GCS eye subscore is 4. GCS verbal subscore is 5. GCS motor subscore is 6.     Cranial Nerves: No cranial nerve deficit, dysarthria or facial asymmetry.     Sensory: No sensory deficit.     Motor: No weakness.     Coordination: Coordination normal.     Gait: Gait normal.     ED Results / Procedures / Treatments   Labs (all labs ordered are listed, but only abnormal results are displayed) Labs Reviewed  BASIC METABOLIC PANEL - Abnormal; Notable for the following components:      Result Value   CO2 18 (*)    Glucose, Bld 107 (*)    BUN 38 (*)    Creatinine, Ser 1.65 (*)    GFR, Estimated 32 (*)    All other components within normal limits  CBC - Abnormal; Notable for the following components:   Hemoglobin 11.1 (*)    MCHC 29.3 (*)    All other components within normal limits  URINALYSIS, ROUTINE W REFLEX MICROSCOPIC    EKG EKG Interpretation  Date/Time:  Tuesday May 15 2020 12:21:16 EDT Ventricular Rate:  75 PR Interval:  198 QRS Duration: 88 QT Interval:  394 QTC Calculation: 439 R Axis:   -  3 Text Interpretation: Normal sinus rhythm T wave abnormality, consider inferolateral ischemia Abnormal ECG No significant change since prior 3/21 Confirmed by Aletta Edouard [phone]) on 05/15/2020 3:56:33 PM   Radiology CT HEAD WO CONTRAST  Result Date: 05/15/2020 CLINICAL DATA:  Dizziness, nonspecific;  headache, classic migraine. EXAM: CT HEAD WITHOUT CONTRAST TECHNIQUE: Contiguous axial images were obtained from the base of the skull through the vertex without intravenous contrast. COMPARISON:  Brain MRI 09/30/2019.  Head CT 09/30/2019. FINDINGS: Brain: Advanced multifocal ill-defined hypoattenuation within the cerebral white matter which is nonspecific, but consistent with chronic small vessel ischemic disease. Redemonstrated are multifocal chronic lacunar infarcts within the basal ganglia and thalami bilaterally. There is no acute intracranial hemorrhage. No demarcated cortical infarct. No extra-axial fluid collection. No evidence of intracranial mass. No midline shift. Vascular: No hyperdense vessel.  Atherosclerotic calcifications. Skull: Normal. Negative for fracture or focal lesion. Sinuses/Orbits: Visualized orbits show no acute finding. Chronic medially displaced fracture deformity of the right lamina papyracea. Subcentimeter left frontal sinus osteoma. Minimal ethmoid and maxillary sinus mucosal thickening. No significant mastoid effusion. Other: Redemonstrated chronic nasal bone fracture deformities. IMPRESSION: No CT evidence of acute intracranial abnormality. Advanced cerebral white matter chronic small vessel ischemic disease. Redemonstration of multiple chronic lacunar infarcts within the basal ganglia and thalami bilaterally. Minimal ethmoid and maxillary sinus mucosal thickening. Electronically Signed   By: Kellie Simmering DO   On: 05/15/2020 13:37    Procedures Procedures (including critical care time)  Medications Ordered in ED Medications  sodium chloride 0.9 % bolus 1,000 mL (0 mLs Intravenous Stopped 05/15/20 1802)  metoCLOPramide (REGLAN) injection 10 mg (10 mg Intravenous Given 05/15/20 1652)    ED Course  I have reviewed the triage vital signs and the nursing notes.  Pertinent labs & imaging results that were available during my care of the patient were reviewed by me and  considered in my medical decision making (see chart for details).  Clinical Course as of May 16 1054  Tue May 15, 2020  1629 Because this patient had similar symptoms in bit of having an stroke on MRI I ordered the patient an MRI.  When I talked to the patient about this she has been refusing the MRI.  She said she is too claustrophobic and even when offered medication she declined.   [MB]  6568 Lab work showing low bicarb elevated BUN and creatinine.  She said she did not have any recent illness no vomiting or diarrhea.  No melena.  We will give her some IV fluids.  She does endorse that she does not drink enough.   [MB]  1840 Headache and dizziness improved.  Patient wants to go home.  Is tolerating p.o. here.  Recommended continuing to push fluids.   [MB]    Clinical Course User Index [MB] Hayden Rasmussen, MD   MDM Rules/Calculators/A&P                         This patient complains of headache dizziness lightheadedness; this involves an extensive number of treatment Options and is a complaint that carries with it a high risk of complications and Morbidity. The differential includes tension headache, migraine, stroke, bleed, dehydration, anemia, metabolic derangement, arrhythmia  I ordered, reviewed and interpreted labs, which included CBC with normal white count, hemoglobin stable, chemistries with new elevation in BUN and creatinine with low bicarb reflecting some volume depletion, urinalysis negative I ordered medication IV fluids and IV Reglan with  improvement in her symptoms I ordered imaging studies which included head CT and I independently    visualized and interpreted imaging which showed no acute findings Previous records obtained and reviewed in epic including prior admission for somewhat similar symptoms in which they identified stroke  I recommended an MRI for the patient, she declined.  After the interventions stated above, I reevaluated the patient and found patient  symptoms to be improved.  Recommended continuing to push fluids and closely follow-up with her PCP and her neurologist.  Return instructions discussed.   Final Clinical Impression(s) / ED Diagnoses Final diagnoses:  Bad headache  AKI (acute kidney injury) [facility])    Rx / [place] Orders ED Discharge Orders    None       Hayden Rasmussen, MD 05/16/20 1058    Hayden Rasmussen, MD 05/16/20 1058

## 2020-05-17 ENCOUNTER — Ambulatory Visit: Payer: Medicare HMO

## 2020-05-17 ENCOUNTER — Ambulatory Visit: Payer: Medicare HMO | Admitting: Surgery

## 2020-05-17 ENCOUNTER — Other Ambulatory Visit (HOSPITAL_COMMUNITY): Payer: Self-pay | Admitting: Physician Assistant

## 2020-05-17 ENCOUNTER — Other Ambulatory Visit: Payer: Self-pay

## 2020-05-17 DIAGNOSIS — M62838 Other muscle spasm: Secondary | ICD-10-CM

## 2020-05-17 DIAGNOSIS — M6281 Muscle weakness (generalized): Secondary | ICD-10-CM

## 2020-05-17 DIAGNOSIS — M25511 Pain in right shoulder: Secondary | ICD-10-CM | POA: Diagnosis not present

## 2020-05-17 DIAGNOSIS — G8929 Other chronic pain: Secondary | ICD-10-CM

## 2020-05-17 DIAGNOSIS — M25611 Stiffness of right shoulder, not elsewhere classified: Secondary | ICD-10-CM

## 2020-05-17 NOTE — Therapy (Signed)
[place], [place], [address] Phone: [phone]   Fax:  [phone]  Physical Therapy Treatment  Patient Details  Name: Alison Whitehead MRN: [id number] Date of Birth: January 10, 1951 Referring Provider (PT): Meredith Staggers, MD   Encounter Date: 05/17/2020   PT End of Session - 05/17/20 1139    Visit Number 8    Number of Visits 12    Date for PT Re-Evaluation 05/17/20    Authorization Type [employer] Medicare    Progress Note Due on Visit 10    PT Start Time 1047    PT Stop Time 1130    PT Time Calculation (min) 43 min    Activity Tolerance Patient tolerated treatment well;Patient limited by pain    Behavior During Therapy [facility] for tasks assessed/performed           Past Medical History:  Diagnosis Date   Allergy    seasonal   Arthritis    DDD (degenerative disc disease)    Degenerative disc disease, lumbar    Hypertension    IBS (irritable bowel syndrome)    Osteopenia    Osteoporosis    osteopenia   Stroke ([facility]) 09/2019    Past Surgical History:  Procedure Laterality Date   SHOULDER SURGERY Left    lipoma    There were no vitals filed for this visit.   Subjective Assessment - 05/17/20 1135    Subjective Pt reports she did something with her R arm last week and it it hurting a great deal today.    Pertinent History arthritis    Currently in Pain? Yes    Pain Score 10-Worst pain ever    Pain Location Shoulder    Pain Orientation Right;Lateral    Pain Descriptors / Indicators Throbbing;Aching    Pain Type Chronic pain    Pain Onset More than a month ago    Pain Frequency Constant                             [facility] Adult PT Treatment/Exercise - 05/17/20 0001      Exercises   Exercises Shoulder;Neck      Shoulder Exercises: Seated   Retraction Both;10 reps    Retraction Limitations 2 sets      Modalities   Modalities Iontophoresis       Iontophoresis   Type of Iontophoresis Dexamethasone   instuction side effect for skin reaction and not to shower   Location R lateral/posterior GH jt line    Dose 24m/ml    Time 6 hours      Manual Therapy   Manual Therapy Soft tissue mobilization    Soft tissue mobilization STW to the R upper trap and deltoid. Significant tenderness to upper trap trigger point      Neck Exercises: Stretches   Upper Trapezius Stretch Right;4 reps;20 seconds    Upper Trapezius Stretch Limitations with L lateral trunk lean                  PT Education - 05/17/20 1137    Education Details With increase in R shoulder pain, recommended revising HEP to scapular retactions and upper trap c body lean for distraction    Person(s) Educated Patient    Methods Explanation    Comprehension Verbalized understanding;Returned demonstration            PT Short Term Goals - 05/02/20 1410  PT SHORT TERM GOAL #1   Title Pt will be independent with initial HEP    Baseline Newly provided    Time 3    Period Weeks    Status Achieved    Target Date 04/26/20      PT SHORT TERM GOAL #2   Title Pt will be able to achieve at least 90 deg of right shoulder flexion and abduction in sitting    Baseline Able to achieve 90 deg of shoulder flexion/abduction; however, needs slight elbow bend    Time 3    Period Weeks    Status Partially Met    Target Date 04/26/20      PT SHORT TERM GOAL #3   Title Pt will report pain </=8/10 at worst for improved QoL    Time 3    Period Weeks    Status Achieved    Target Date 04/26/20      PT SHORT TERM GOAL #4   Title Pt will report 50% improvement in her ability to perform dressing and bathing tasks    Baseline Pt states she is able to perform these; however, with a lot of pain    Time 3    Period Weeks    Status On-going    Target Date 04/26/20             PT Long Term Goals - 05/17/20 1156      PT LONG TERM GOAL #1   Title Pt will be independent with  advanced HEP. on-going    Status On-going    Target Date 06/23/20      PT LONG TERM GOAL #2   Title Pt will have WFL right shoulder ROM against gravity. On-going    Status On-going    Target Date 06/23/20      PT LONG TERM GOAL #3   Title Pt will be able to place cups into her kitchen cabinet. On-going    Baseline Unable    Status On-going      PT LONG TERM GOAL #4   Title Pt will report decrease in shoulder pain to </=5/10 at worst. On-going    Status On-going    Target Date 06/23/20      PT LONG TERM GOAL #5   Title Pt will have improved FOTO score to at least 40%. On-going    Baseline 55% limitation initially    Status On-going    Target Date 06/23/20                 Plan - 05/17/20 1143    Clinical Impression Statement Pt presents today with a marked increase in R shoulder pain which has been an issue since last week after moving it the wrong way. Pt has pain and is not able flex sh past 90d. Full and empty can tests were positive. Pt is TTP of the upper trap and ant deltoid. PT provided STW to these areas. HEP was limited to scapular retactions and upper trap stretch c trunk lean for [place] jt distratction. Pt reported 7/10 level of pain after this portion of the session. Ionto. was then provided to the R shoulder for pain. pt's R shoulder pain and functional has decreased since the last visit c RTC weakness and pain presents. Pt will benefit from PT to reduce R shoulder pain, increase strength and improve function.    Personal Factors and Comorbidities Age;Time since onset of injury/illness/exacerbation;Comorbidity 1;Comorbidity 2;Comorbidity 3+    Comorbidities arthritis, osteoporosis, HTN,  recent CVA this year (pt unable to state what CVA affected), HTN    Examination-Activity Limitations Bathing;Bed Mobility;Carry;Hygiene/Grooming;Lift;Reach Overhead;Toileting    Examination-Participation Restrictions Church;Cleaning;Community Activity;Driving;Shop;[employer]     Stability/Clinical Decision Making Evolving/Moderate complexity    Clinical Decision Making Moderate    Rehab Potential Good    PT Frequency 2x / week    PT Duration 6 weeks    PT Treatment/Interventions ADLs/Self Care Home Management;Aquatic Therapy;Cryotherapy;Electrical Stimulation;Iontophoresis 98m/ml Dexamethasone;Moist Heat;Traction;Ultrasound;DME Instruction;Functional mobility training;Therapeutic activities;Therapeutic exercise;Neuromuscular re-education;Patient/family education;Manual techniques;Passive range of motion;Dry needling;Taping    PT Next Visit Plan Assess response to ionto. Progress ther ex/HEP as tolerated.    PT Home Exercise Plan Access Code: [email or web address] Table AAROM, row, scapular retraction, bicep stretch; ulner nerve glides in supine and seated as tolerated    Consulted and Agree with Plan of Care Patient           Patient will benefit from skilled therapeutic intervention in order to improve the following deficits and impairments:  Decreased coordination, Decreased range of motion, Increased fascial restricitons, Increased muscle spasms, Impaired UE functional use, Pain, Impaired flexibility, Decreased mobility, Decreased strength, Postural dysfunction  Visit Diagnosis: Chronic right shoulder pain  Stiffness of right shoulder, not elsewhere classified  Muscle weakness (generalized)  Other muscle spasm     Problem List Patient Active Problem List   Diagnosis Date Noted   Rotator cuff syndrome, right 01/11/2020   Labile blood glucose    Vascular headache    Small vessel disease, cerebrovascular    Prediabetes    [place] [place]    [place] 10/[place]   Ac[place] ([place]) [place] 09/[place]   Unilateral primary osteoarthritis, left knee 03/17/2019   Unilateral primary osteoarthritis, right knee 03/17/2019   Bilateral primary osteoarthritis of knee 03/10/2019   INSOMNIA, CHRONIC 05/19/2007     SPONDYLOSIS, LUMBAR 05/19/2007   Essential hypertension 05/03/2007   ARTHRITIS, KNEE 05/03/2007    AGar PontoMS, PT 05/17/20 1:02 PM  [facility] [address]Phone: [phone]  Fax:  [phone] Name: CMisa FedorkoMRN: [id number] of Birth: 1October 04, 1952

## 2020-05-30 ENCOUNTER — Other Ambulatory Visit: Payer: Self-pay

## 2020-05-30 ENCOUNTER — Ambulatory Visit: Payer: Medicare HMO

## 2020-05-30 DIAGNOSIS — G8929 Other chronic pain: Secondary | ICD-10-CM

## 2020-05-30 DIAGNOSIS — M6281 Muscle weakness (generalized): Secondary | ICD-10-CM

## 2020-05-30 DIAGNOSIS — M62838 Other muscle spasm: Secondary | ICD-10-CM

## 2020-05-30 DIAGNOSIS — M25511 Pain in right shoulder: Secondary | ICD-10-CM | POA: Diagnosis not present

## 2020-05-30 DIAGNOSIS — M25611 Stiffness of right shoulder, not elsewhere classified: Secondary | ICD-10-CM

## 2020-05-31 NOTE — Therapy (Signed)
[place], [place], [address] Phone: [phone]   Fax:  [phone]  Physical Therapy Treatment  Patient Details  Name: Alison Whitehead MRN: [id number] Date of Birth: Jul 01, 1951 Referring Provider (PT): Meredith Staggers, MD   Encounter Date: 05/30/2020   PT End of Session - 05/31/20 0752    Visit Number 9    Number of Visits 12    Date for PT Re-Evaluation 06/24/20    Authorization Type [employer] Medicare    Progress Note Due on Visit 10    PT Start Time 1142    PT Stop Time 1230    PT Time Calculation (min) 48 min    Activity Tolerance Patient tolerated treatment well;Patient limited by pain    Behavior During Therapy [facility] for tasks assessed/performed           Past Medical History:  Diagnosis Date  . Allergy    seasonal  . Arthritis   . DDD (degenerative disc disease)   . Degenerative disc disease, lumbar   . Hypertension   . IBS (irritable bowel syndrome)   . Osteopenia   . Osteoporosis    osteopenia  . Stroke [facility]) 09/2019    Past Surgical History:  Procedure Laterality Date  . SHOULDER SURGERY Left    lipoma    There were no vitals filed for this visit.   Subjective Assessment - 05/31/20 0757    Subjective Pt reports her R shoulder pain has been improved. Pt has 0 to little pain c below shoulder activities, but 6/10 level of pain with trying to raise her R UE above her shoulders.    Pertinent History arthritis    Patient Stated Goals Decrease pain    Currently in Pain? Yes    Pain Score 6     Pain Location Shoulder    Pain Orientation Left    Pain Descriptors / Indicators Aching;Throbbing    Pain Type Chronic pain    Pain Onset More than a month ago    Pain Frequency Intermittent                             [facility] Adult PT Treatment/Exercise - 05/31/20 0001      Exercises   Exercises Shoulder;Neck      Shoulder Exercises: Supine   Protraction  Both;10 reps    Protraction Limitations c cane    Horizontal ABduction Both;10 reps    Theraband Level (Shoulder Horizontal ABduction) Level 1 (Yellow)    Horizontal ABduction Limitations 2 sets    External Rotation Both;10 reps    External Rotation Limitations 2 sets      Shoulder Exercises: Seated   Retraction Both;10 reps    Retraction Limitations 2 sets    Other Seated Exercises SH ext. 10x2 c YTB      Modalities   Modalities Cryotherapy      Cryotherapy   Number Minutes Cryotherapy 10 Minutes    Cryotherapy Location Shoulder   R   Type of Cryotherapy Ice pack      Manual Therapy   Manual Therapy Joint mobilization    Joint Mobilization LAD of the [place] jt c grade 3 mobs                    PT Short Term Goals - 05/02/20 1410      PT SHORT TERM GOAL #1   Title Pt will be  independent with initial HEP    Baseline Newly provided    Time 3    Period Weeks    Status Achieved    Target Date 04/26/20      PT SHORT TERM GOAL #2   Title Pt will be able to achieve at least 90 deg of right shoulder flexion and abduction in sitting    Baseline Able to achieve 90 deg of shoulder flexion/abduction; however, needs slight elbow bend    Time 3    Period Weeks    Status Partially Met    Target Date 04/26/20      PT SHORT TERM GOAL #3   Title Pt will report pain </=8/10 at worst for improved QoL    Time 3    Period Weeks    Status Achieved    Target Date 04/26/20      PT SHORT TERM GOAL #4   Title Pt will report 50% improvement in her ability to perform dressing and bathing tasks    Baseline Pt states she is able to perform these; however, with a lot of pain    Time 3    Period Weeks    Status On-going    Target Date 04/26/20             PT Long Term Goals - 05/17/20 1156      PT LONG TERM GOAL #1   Title Pt will be independent with advanced HEP. on-going    Status On-going    Target Date 06/23/20      PT LONG TERM GOAL #2   Title Pt will have WFL right  shoulder ROM against gravity. On-going    Status On-going    Target Date 06/23/20      PT LONG TERM GOAL #3   Title Pt will be able to place cups into her kitchen cabinet. On-going    Baseline Unable    Status On-going      PT LONG TERM GOAL #4   Title Pt will report decrease in shoulder pain to </=5/10 at worst. On-going    Status On-going    Target Date 06/23/20      PT LONG TERM GOAL #5   Title Pt will have improved FOTO score to at least 40%. On-going    Baseline 55% limitation initially    Status On-going    Target Date 06/23/20                 Plan - 05/31/20 0800    Clinical Impression Statement Pt reports indicates improved R shoulder pain since the last PT session. Pt demonstrates R shoulder and pain with trying to lift her R UE above shoulder height, short or long lever. PT returned to shoulder posterior chain strengthening to support the rotator cuff with R UE elevation. Pttolerated the session well.    Personal Factors and Comorbidities Age;Time since onset of injury/illness/exacerbation;Comorbidity 1;Comorbidity 2;Comorbidity 3+    Comorbidities arthritis, osteoporosis, HTN, recent CVA this year (pt unable to state what CVA affected), HTN    Examination-Activity Limitations Bathing;Bed Mobility;Carry;Hygiene/Grooming;Lift;Reach Overhead;Toileting    Examination-Participation Restrictions Church;Cleaning;Community Activity;Driving;Shop;[employer]    Stability/Clinical Decision Making Evolving/Moderate complexity    Clinical Decision Making Moderate    Rehab Potential Good    PT Frequency 2x / week    PT Duration 6 weeks    PT Treatment/Interventions ADLs/Self Care Home Management;Aquatic Therapy;Cryotherapy;Electrical Stimulation;Iontophoresis 4mg /ml Dexamethasone;Moist Heat;Traction;Ultrasound;DME Instruction;Functional mobility training;Therapeutic activities;Therapeutic exercise;Neuromuscular re-education;Patient/family education;Manual techniques;Passive  range of motion;Dry  needling;Taping    PT Next Visit Plan Continue shoulder posterior chain strengthening    PT Home Exercise Plan Access Code: [email or web address]. Table AAROM, row, scapular retraction, bicep stretch; ulner nerve glides in supine and seated as tolerated           Patient will benefit from skilled therapeutic intervention in order to improve the following deficits and impairments:  Decreased coordination, Decreased range of motion, Increased fascial restricitons, Increased muscle spasms, Impaired UE functional use, Pain, Impaired flexibility, Decreased mobility, Decreased strength, Postural dysfunction  Visit Diagnosis: Chronic right shoulder pain  Stiffness of right shoulder, not elsewhere classified  Muscle weakness (generalized)  Other muscle spasm     Problem List Patient Active Problem List   Diagnosis Date Noted  . Rotator cuff syndrome, right 01/11/2020  . Labile blood glucose   . Vascular headache   . Small vessel disease, cerebrovascular   . Prediabetes   . AKI (acute kidney injury) ([place])   . Right pontine stroke ([place]) 10/06/2019  . Acute cerebrovascular accident (CVA) ([place]) 09/30/2019  . Unilateral primary osteoarthritis, left knee 03/17/2019  . Unilateral primary osteoarthritis, right knee 03/17/2019  . Bilateral primary osteoarthritis of knee 03/10/2019  . INSOMNIA, CHRONIC 05/19/2007  . SPONDYLOSIS, LUMBAR 05/19/2007  . Essential hypertension 05/03/2007  . ARTHRITIS, KNEE 05/03/2007    Gar Ponto 05/31/2020, 8:12 AM  [facility] [address] [place], [place], [address] Phone: [phone]   Fax:  [phone]  Name: Alison Whitehead MRN: [id number] Date of Birth: 05-07-1951

## 2020-06-01 ENCOUNTER — Other Ambulatory Visit (HOSPITAL_COMMUNITY): Payer: Self-pay | Admitting: Physician Assistant

## 2020-06-06 ENCOUNTER — Other Ambulatory Visit: Payer: Self-pay

## 2020-06-06 ENCOUNTER — Ambulatory Visit: Payer: Medicare HMO | Attending: Physical Medicine & Rehabilitation

## 2020-06-06 DIAGNOSIS — M25511 Pain in right shoulder: Secondary | ICD-10-CM | POA: Insufficient documentation

## 2020-06-06 DIAGNOSIS — M62838 Other muscle spasm: Secondary | ICD-10-CM | POA: Diagnosis present

## 2020-06-06 DIAGNOSIS — M25611 Stiffness of right shoulder, not elsewhere classified: Secondary | ICD-10-CM | POA: Diagnosis present

## 2020-06-06 DIAGNOSIS — M6281 Muscle weakness (generalized): Secondary | ICD-10-CM

## 2020-06-06 DIAGNOSIS — G8929 Other chronic pain: Secondary | ICD-10-CM

## 2020-06-06 NOTE — Therapy (Signed)
[place], [place], [address] Phone: [phone]   Fax:  [phone]  Physical Therapy Treatment/Discharge Summary  Patient Details  Name: Alison Whitehead MRN: [id number] Date of Birth: Sep 11, 1950 Referring Provider (PT): Meredith Staggers, MD   Encounter Date: 06/06/2020   PT End of Session - 06/06/20 1104    Visit Number 10    Number of Visits 10    Date for PT Re-Evaluation 06/24/20    Authorization Type [employer] Medicare    Progress Note Due on Visit 10    PT Start Time 1100    PT Stop Time 1130    PT Time Calculation (min) 30 min    Activity Tolerance Patient tolerated treatment well;Patient limited by pain    Behavior During Therapy [facility] for tasks assessed/performed           Past Medical History:  Diagnosis Date   Allergy    seasonal   Arthritis    DDD (degenerative disc disease)    Degenerative disc disease, lumbar    Hypertension    IBS (irritable bowel syndrome)    Osteopenia    Osteoporosis    osteopenia   Stroke ([place]) 09/2019    Past Surgical History:  Procedure Laterality Date   SHOULDER SURGERY Left    lipoma    There were no vitals filed for this visit.   Subjective Assessment - 06/06/20 1136    Subjective Pt reports her R shoulder pain is about the same. At rest her pain is 0 or low, but significant around 7/10 when trying to use her R arm. Overall, she reports her R shoulder pain is better sinc ebeginning PT>    Pertinent History arthritis    Limitations Lifting    Currently in Pain? Yes    Pain Score 7     Pain Location Shoulder    Pain Orientation Right;Lateral    Pain Descriptors / Indicators Aching;Throbbing    Pain Type Chronic pain    Pain Onset More than a month ago    Pain Frequency Intermittent    Aggravating Factors  Trying to lift my arm    Pain Relieving Factors PT, rest    Effect of Pain on Daily Activities Unable to lift arm                              [facility] Adult PT Treatment/Exercise - 06/06/20 0001      Shoulder Exercises: Seated   Retraction Both;10 reps    Retraction Limitations 2 sets    Other Seated Exercises Table top flexion and abd, 10x2 each      Shoulder Exercises: Standing   Row Both;10 reps    Theraband Level (Shoulder Row) Level 1 (Yellow)    Row Limitations 2 sets                  PT Education - 06/06/20 1155    Education Details Final HEP    Person(s) Educated Patient    Methods Explanation;Demonstration;Tactile cues;Verbal cues;Handout    Comprehension Verbalized understanding;Returned demonstration            PT Short Term Goals - 05/02/20 1410      PT SHORT TERM GOAL #1   Title Pt will be independent with initial HEP    Baseline Newly provided    Time 3    Period Weeks    Status Achieved  Target Date 04/26/20      PT SHORT TERM GOAL #2   Title Pt will be able to achieve at least 90 deg of right shoulder flexion and abduction in sitting    Baseline Able to achieve 90 deg of shoulder flexion/abduction; however, needs slight elbow bend    Time 3    Period Weeks    Status Partially Met    Target Date 04/26/20      PT SHORT TERM GOAL #3   Title Pt will report pain </=8/10 at worst for improved QoL    Time 3    Period Weeks    Status Achieved    Target Date 04/26/20      PT SHORT TERM GOAL #4   Title Pt will report 50% improvement in her ability to perform dressing and bathing tasks    Baseline Pt states she is able to perform these; however, with a lot of pain    Time 3    Period Weeks    Status On-going    Target Date 04/26/20             PT Long Term Goals - 06/06/20 1206      PT LONG TERM GOAL #1   Title Pt will be independent with advanced HEP. Pt is Ind in an appropriate HEP for the status of her R shoulder.    Status Achieved    Target Date 06/06/20      PT LONG TERM GOAL #2   Title Pt will have WFL right shoulder ROM against  gravity. Not met    Status Not Met    Target Date 06/06/20      PT LONG TERM GOAL #3   Title Pt will be able to place cups into her kitchen cabinet. Partial met with pt able to complete with pain    Status Partially Met      PT LONG TERM GOAL #4   Title Pt will report decrease in shoulder pain to </=5/10 at worst. Not met- 7/10 or less    Status Not Met      PT LONG TERM GOAL #5   Title Pt will have improved FOTO score to at least 40%. Not retaken/met    Status Not Met                 Plan - 06/06/20 1156    Clinical Impression Statement Pt continues demonstrate R shoulder weakness and pain consistent with a R RCT. Pt is not able to lift her R arm in either a long or short arm position. Empty and full can tests are both positive for pain and weakness. Pt does report some improvement in R shoulder pain since the initiation of PT. Pt has reached max potential with PT with goals partially met. Pt has an appt. c Dr. Naaman Plummer next week, and further care of the R shoulder will be determined at that time.    Personal Factors and Comorbidities Age;Time since onset of injury/illness/exacerbation;Comorbidity 1;Comorbidity 2;Comorbidity 3+    Comorbidities arthritis, osteoporosis, HTN, recent CVA this year (pt unable to state what CVA affected), HTN    Examination-Activity Limitations Bathing;Bed Mobility;Carry;Hygiene/Grooming;Lift;Reach Overhead;Toileting    Examination-Participation Restrictions Church;Cleaning;Community Activity;Driving;Shop;[employer]    Stability/Clinical Decision Making Evolving/Moderate complexity    Clinical Decision Making Moderate    Rehab Potential Good    PT Frequency 2x / week    PT Treatment/Interventions ADLs/Self Care Home Management;Aquatic Therapy;Cryotherapy;Electrical Stimulation;Iontophoresis 12m/ml Dexamethasone;Moist Heat;Traction;Ultrasound;DME Instruction;Functional mobility training;Therapeutic  activities;Therapeutic exercise;Neuromuscular  re-education;Patient/family education;Manual techniques;Passive range of motion;Dry needling;Taping    PT Home Exercise Plan Access Code: [email or web address]. Final HEP    Consulted and Agree with Plan of Care Patient           Patient will benefit from skilled therapeutic intervention in order to improve the following deficits and impairments:  Decreased coordination, Decreased range of motion, Increased fascial restricitons, Increased muscle spasms, Impaired UE functional use, Pain, Impaired flexibility, Decreased mobility, Decreased strength, Postural dysfunction  Visit Diagnosis: Chronic right shoulder pain  Stiffness of right shoulder, not elsewhere classified  Muscle weakness (generalized)  Other muscle spasm     Problem List Patient Active Problem List   Diagnosis Date Noted   Rotator cuff syndrome, right 01/11/2020   Labile blood glucose    Vascular headache    Small vessel disease, cerebrovascular    Prediabetes    AKI (acute kidney injury) ([place])    Right pontine stroke ([place]) 10/06/2019   Acute cerebrovascular accident (CVA) ([place]) 09/30/2019   Unilateral primary osteoarthritis, left knee 03/17/2019   Unilateral primary osteoarthritis, right knee 03/17/2019   Bilateral primary osteoarthritis of knee 03/10/2019   INSOMNIA, CHRONIC 05/19/2007   SPONDYLOSIS, LUMBAR 05/19/2007   Essential hypertension 05/03/2007   ARTHRITIS, KNEE 05/03/2007    Gar Ponto MS, PT 06/06/20 12:15 PM  PHYSICAL THERAPY DISCHARGE SUMMARY  Visits from Start of Care: 10  Current functional level related to goals / functional outcomes: See above   Remaining deficits: See above   Education / Equipment: HEP  Plan: Patient agrees to discharge.  Patient goals were not met. Patient is being discharged due to lack of progress.  ?????        [place], [place], [address] Phone: [phone]   Fax:   [phone]  Name: Grainne Knights MRN: [id number] Date of Birth: 08-16-1950

## 2020-06-07 ENCOUNTER — Other Ambulatory Visit (HOSPITAL_COMMUNITY): Payer: Self-pay | Admitting: Physician Assistant

## 2020-06-13 ENCOUNTER — Other Ambulatory Visit: Payer: Self-pay

## 2020-06-13 ENCOUNTER — Encounter: Payer: Self-pay | Admitting: Physical Medicine & Rehabilitation

## 2020-06-13 ENCOUNTER — Encounter: Payer: Medicare HMO | Attending: Physical Medicine & Rehabilitation | Admitting: Physical Medicine & Rehabilitation

## 2020-06-13 DIAGNOSIS — M75101 Unspecified rotator cuff tear or rupture of right shoulder, not specified as traumatic: Secondary | ICD-10-CM | POA: Insufficient documentation

## 2020-06-13 NOTE — Patient Instructions (Signed)
PLEASE FEEL FREE TO CALL OUR OFFICE WITH ANY PROBLEMS OR QUESTIONS ([phone])      

## 2020-06-13 NOTE — Progress Notes (Signed)
PROCEDURE NOTE  DIAGNOSIS:   INTERVENTION:       After informed consent and preparation of the skin with betadine and isopropyl alcohol, I injected 6mg  (1cc) of celestone and 4cc of 1% lidocaine into the right subacromial via lateral approach. Additionally, aspiration was performed prior to injection. The patient tolerated well, and no complications were encountered. Afterward the area was cleaned and dressed. Post- injection instructions were provided.      Meredith Staggers, MD, [facility] 06/13/2020

## 2020-06-15 ENCOUNTER — Other Ambulatory Visit (HOSPITAL_COMMUNITY): Payer: Self-pay | Admitting: Family Medicine

## 2020-07-12 ENCOUNTER — Other Ambulatory Visit (HOSPITAL_COMMUNITY): Payer: Self-pay | Admitting: Family Medicine

## 2020-08-06 ENCOUNTER — Other Ambulatory Visit (HOSPITAL_COMMUNITY): Payer: Self-pay | Admitting: Cardiovascular Disease

## 2020-08-09 ENCOUNTER — Other Ambulatory Visit (HOSPITAL_COMMUNITY): Payer: Self-pay | Admitting: Family Medicine

## 2020-08-14 ENCOUNTER — Other Ambulatory Visit (HOSPITAL_COMMUNITY): Payer: Self-pay | Admitting: Cardiovascular Disease

## 2020-08-15 ENCOUNTER — Other Ambulatory Visit: Payer: Self-pay

## 2020-08-15 ENCOUNTER — Encounter: Payer: Medicare HMO | Attending: Physical Medicine & Rehabilitation | Admitting: Physical Medicine & Rehabilitation

## 2020-08-15 ENCOUNTER — Encounter: Payer: Self-pay | Admitting: Physical Medicine & Rehabilitation

## 2020-08-15 VITALS — BP 149/90 | HR 82 | Temp 98.3°F | Ht 67.0 in | Wt 228.0 lb

## 2020-08-15 DIAGNOSIS — M1712 Unilateral primary osteoarthritis, left knee: Secondary | ICD-10-CM

## 2020-08-15 DIAGNOSIS — I6350 Cerebral infarction due to unspecified occlusion or stenosis of unspecified cerebral artery: Secondary | ICD-10-CM | POA: Diagnosis not present

## 2020-08-15 NOTE — Patient Instructions (Signed)
PLEASE FEEL FREE TO CALL OUR OFFICE WITH ANY PROBLEMS OR QUESTIONS ([phone])                   REMEMBER TO WORK ON YOUR SHOULDER BLADE RANGE OF MOTION

## 2020-08-15 NOTE — Progress Notes (Signed)
Subjective:    Patient ID: Alison Whitehead, female    DOB: April 03, 1951, 70 y.o.   MRN: [id number]  HPI   Alison Whitehead is here in follow-up of a right pontine infarct and associated deficits as well as her right shoulder pain.  At the last visit we injected her right shoulder with good results. She is not stretching the shoulder routinely. The pain can wax and wane from day to day. She uses that arm to hold her cane as well.   Her left knee has been acting up also. She is experiencing cracking and popping in the knee. We last injected it almost a year ago. She received authorization for synvisc by insurance.   She is going to see a nutritionist to help lose weight. She has gained quite a bit of weight over the last year.    Pain Inventory Average Pain 8 Pain Right Now 8 My pain is sharp and stabbing  In the last 24 hours, has pain interfered with the following? General activity 0 Relation with others 0 Enjoyment of life 0 What TIME of day is your pain at its worst? morning  and daytime Sleep (in general) Good  Pain is worse with: walking and some activites Pain improves with: rest and medication Relief from Meds: 5  Family History  Problem Relation Age of Onset  . Breast cancer Maternal Aunt   . Diabetes Mother   . Hypertension Mother   . Glaucoma Mother   . Other Father        accident  . Diabetes Sister   . Hypertension Sister   . Cancer Sister   . Colon cancer Neg Hx   . Colon polyps Neg Hx   . Esophageal cancer Neg Hx   . Stomach cancer Neg Hx   . Rectal cancer Neg Hx    Social History   Socioeconomic History  . Marital status: Widowed    Spouse name: Not on file  . Number of children: 2  . Years of education: Not on file  . Highest education level: Some college, no degree  Occupational History    Comment: retired  Tobacco Use  . Smoking status: Former Smoker    Quit date: 10/13/2019    Years since quitting: 0.8  . Smokeless tobacco: Never Used   Vaping Use  . Vaping Use: Never used  Substance and Sexual Activity  . Alcohol use: Never  . Drug use: Never  . Sexual activity: Not Currently    Birth control/protection: None  Other Topics Concern  . Not on file  Social History Narrative   12/13/19 Lives alone   No caffeine   Social Determinants of Health   Financial Resource Strain: Not on file  Food Insecurity: Not on file  Transportation Needs: Not on file  Physical Activity: Not on file  Stress: Not on file  Social Connections: Not on file   Past Surgical History:  Procedure Laterality Date  . SHOULDER SURGERY Left    lipoma   Past Surgical History:  Procedure Laterality Date  . SHOULDER SURGERY Left    lipoma   Past Medical History:  Diagnosis Date  . Allergy    seasonal  . Arthritis   . DDD (degenerative disc disease)   . Degenerative disc disease, lumbar   . Hypertension   . IBS (irritable bowel syndrome)   . Osteopenia   . Osteoporosis    osteopenia  . Stroke ([place]) 09/2019   BP (!) 149/90  Pulse 82   Temp 98.3 F (36.8 C)   Ht 5\' 7"  (1.702 m)   Wt 228 lb (103.4 kg)   SpO2 95%   BMI 35.71 kg/m   Opioid Risk Score:   Fall Risk Score:  `1  Depression screen PHQ 2/9  Depression screen PHQ 2/9 04/18/2020  Decreased Interest 0  Down, Depressed, Hopeless 0  PHQ - 2 Score 0     Review of Systems  Musculoskeletal: Positive for back pain.       Knee pain  All other systems reviewed and are negative.      Objective:   Physical Exam  General: No acute distress HEENT: EOMI, oral membranes moist Cards: reg rate  Chest: normal effort Abdomen: Soft, NT, ND Skin: dry, intact Extremities: no edema Psych: pleasant and appropriate Neuro: Alert and oriented x 3. Normal insight and awareness. Intact Memory. Normal language and speech. Cranial nerve exam unremarkable.  Sensory exam is normal. Reflexes are 2+ in all 4's. Fine motor coordination is intact. No tremors. Motor function is grossly  5/5 RUE and RLE 5/5. Mild pronator drift still .  musculoskeletal:  Mild crepitus in left knee more than right.  She is antalgic with weightbearing on the left.  right shoulder +impingement maneuver but improved compared to pre-injection. Almost full AROM              Assessment & Plan:  Medical Problem List and Plan: 1.  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 2.  Post- stroke HA:             -Continue Topamax 50 mg twice daily for headaches.  no RF today 3. HTN:  This is followed by cardiology and primary physician. Much improved 4.  Right rotator cuff tendinitis: HEP  -the injection helped quite a bit.              -discussed importance of scapular rom. 5. OA bilateral knees: continue Voltaren gel to knees             -left knee was injected in June with good results. Pain returned quickly             -she's approved for  synvisc injections     Fifteen minutes of face to face patient care time were spent during this visit. All questions were encouraged and answered.  Follow up with me in 2 mos for left knee synvisc .

## 2020-08-22 ENCOUNTER — Ambulatory Visit: Payer: Medicare HMO | Attending: Internal Medicine

## 2020-08-22 ENCOUNTER — Other Ambulatory Visit (HOSPITAL_COMMUNITY): Payer: Self-pay | Admitting: Internal Medicine

## 2020-08-22 DIAGNOSIS — Z23 Encounter for immunization: Secondary | ICD-10-CM

## 2020-08-22 NOTE — Progress Notes (Signed)
   Covid-19 Vaccination Clinic  Name:  Alison Whitehead    MRN: [id number] DOB: March 12, 1951  08/22/2020  [name] was observed post Covid-19 immunization for 15 minutes without incident. She was provided with Vaccine Information Sheet and instruction to access the V-Safe system.   [name] was instructed to call 911 with any severe reactions post vaccine: Marland Kitchen Difficulty breathing  . Swelling of face and throat  . A fast heartbeat  . A bad rash all over body  . Dizziness and weakness   Immunizations Administered    Name Date Dose VIS Date Route   Moderna Covid-19 Booster Vaccine 08/22/2020 11:32 AM 0.25 mL 05/23/2020 Intramuscular   Manufacturer: Levan [place]   Lot: [id number]   [place]: 16384-665-99

## 2020-08-30 ENCOUNTER — Other Ambulatory Visit (HOSPITAL_COMMUNITY): Payer: Self-pay | Admitting: Physician Assistant

## 2020-09-05 ENCOUNTER — Other Ambulatory Visit (HOSPITAL_COMMUNITY): Payer: Self-pay | Admitting: Nurse Practitioner

## 2020-09-10 ENCOUNTER — Other Ambulatory Visit (HOSPITAL_COMMUNITY): Payer: Self-pay | Admitting: Family Medicine

## 2020-09-23 ENCOUNTER — Other Ambulatory Visit (HOSPITAL_COMMUNITY): Payer: Self-pay | Admitting: Physician Assistant

## 2020-10-08 ENCOUNTER — Other Ambulatory Visit (HOSPITAL_COMMUNITY): Payer: Self-pay | Admitting: Family Medicine

## 2020-10-09 ENCOUNTER — Other Ambulatory Visit (HOSPITAL_COMMUNITY): Payer: Self-pay | Admitting: Nurse Practitioner

## 2020-10-17 ENCOUNTER — Other Ambulatory Visit: Payer: Self-pay

## 2020-10-17 ENCOUNTER — Encounter: Payer: Medicare HMO | Attending: Physical Medicine & Rehabilitation | Admitting: Physical Medicine & Rehabilitation

## 2020-10-17 ENCOUNTER — Encounter: Payer: Self-pay | Admitting: Physical Medicine & Rehabilitation

## 2020-10-17 VITALS — BP 134/83 | HR 80 | Temp 97.7°F | Ht 67.0 in | Wt 225.2 lb

## 2020-10-17 DIAGNOSIS — M170 Bilateral primary osteoarthritis of knee: Secondary | ICD-10-CM

## 2020-10-17 DIAGNOSIS — M1712 Unilateral primary osteoarthritis, left knee: Secondary | ICD-10-CM | POA: Diagnosis not present

## 2020-10-17 NOTE — Patient Instructions (Signed)
PLEASE FEEL FREE TO CALL OUR OFFICE WITH ANY PROBLEMS OR QUESTIONS ([phone])      

## 2020-10-17 NOTE — Progress Notes (Signed)
PROCEDURE NOTE  DIAGNOSIS: OA  Left knee  INTERVENTION:  synvisc one injection     After informed consent and preparation of the skin with betadine and isopropyl alcohol, I injected 6mg  (1cc) of celestone and 4cc of 1% lidocaine into the left knee via anterolateral approach. Additionally, aspiration was performed prior to injection. The patient tolerated well, and no complications were encountered. Afterward the area was cleaned and dressed. Post- injection instructions were provided.      Meredith Staggers, MD, [facility] 10/17/2020

## 2020-11-01 ENCOUNTER — Other Ambulatory Visit (HOSPITAL_COMMUNITY): Payer: Self-pay | Admitting: Nephrology

## 2020-11-08 ENCOUNTER — Other Ambulatory Visit (HOSPITAL_COMMUNITY): Payer: Self-pay

## 2020-11-08 MED ORDER — HYDROCODONE-ACETAMINOPHEN 10-325 MG PO TABS
1.0000 | ORAL_TABLET | Freq: Every day | ORAL | 0 refills | Status: DC | PRN
Start: 2020-11-08 — End: 2021-05-22
  Filled 2020-11-08: qty 150, 32d supply, fill #0

## 2020-11-09 ENCOUNTER — Other Ambulatory Visit (HOSPITAL_COMMUNITY): Payer: Self-pay

## 2020-11-09 ENCOUNTER — Other Ambulatory Visit: Payer: Self-pay | Admitting: Nephrology

## 2020-11-09 DIAGNOSIS — I129 Hypertensive chronic kidney disease with stage 1 through stage 4 chronic kidney disease, or unspecified chronic kidney disease: Secondary | ICD-10-CM

## 2020-11-09 DIAGNOSIS — N183 Chronic kidney disease, stage 3 unspecified: Secondary | ICD-10-CM

## 2020-11-09 DIAGNOSIS — E1122 Type 2 diabetes mellitus with diabetic chronic kidney disease: Secondary | ICD-10-CM

## 2020-11-09 DIAGNOSIS — N2581 Secondary hyperparathyroidism of renal origin: Secondary | ICD-10-CM

## 2020-11-09 DIAGNOSIS — E785 Hyperlipidemia, unspecified: Secondary | ICD-10-CM

## 2020-11-09 MED ORDER — DIETHYLPROPION HCL ER 75 MG PO TB24
1.0000 | ORAL_TABLET | Freq: Every day | ORAL | 0 refills | Status: DC
  Filled 2020-11-09: qty 30, 30d supply, fill #0

## 2020-11-09 MED ORDER — ONDANSETRON HCL 4 MG PO TABS
ORAL_TABLET | ORAL | 0 refills | Status: DC
  Filled 2020-11-09: qty 90, 30d supply, fill #0

## 2020-11-09 MED ORDER — RYBELSUS 7 MG PO TABS
ORAL_TABLET | ORAL | 0 refills | Status: DC
  Filled 2020-11-09: qty 30, 30d supply, fill #0

## 2020-11-09 MED FILL — Gabapentin Cap 100 MG: ORAL | 30 days supply | Qty: 60 | Fill #0 | Status: AC

## 2020-11-22 ENCOUNTER — Ambulatory Visit
Admission: RE | Admit: 2020-11-22 | Discharge: 2020-11-22 | Disposition: A | Discharge status: Home / Self Care | Payer: Medicare HMO | Source: Ambulatory Visit | Attending: Nephrology | Admitting: Nephrology

## 2020-11-22 DIAGNOSIS — N183 Chronic kidney disease, stage 3 unspecified: Secondary | ICD-10-CM

## 2020-11-22 DIAGNOSIS — E785 Hyperlipidemia, unspecified: Secondary | ICD-10-CM

## 2020-11-22 DIAGNOSIS — E1122 Type 2 diabetes mellitus with diabetic chronic kidney disease: Secondary | ICD-10-CM

## 2020-11-22 DIAGNOSIS — N2581 Secondary hyperparathyroidism of renal origin: Secondary | ICD-10-CM

## 2020-11-22 DIAGNOSIS — I129 Hypertensive chronic kidney disease with stage 1 through stage 4 chronic kidney disease, or unspecified chronic kidney disease: Secondary | ICD-10-CM

## 2020-11-29 ENCOUNTER — Other Ambulatory Visit (HOSPITAL_COMMUNITY): Payer: Self-pay

## 2020-11-29 MED ORDER — BENEFIBER PO POWD: ORAL | 1 refills | Status: DC

## 2020-11-29 MED FILL — Metoprolol Succinate Tab ER 24HR 25 MG (Tartrate Equiv): ORAL | 90 days supply | Qty: 90 | Fill #0 | Status: AC

## 2020-11-30 ENCOUNTER — Other Ambulatory Visit (HOSPITAL_COMMUNITY): Payer: Self-pay

## 2020-11-30 MED ORDER — TRIAMTERENE-HCTZ 37.5-25 MG PO TABS
1.0000 | ORAL_TABLET | Freq: Every day | ORAL | 1 refills | Status: DC
  Filled 2020-11-30: qty 90, 90d supply, fill #0

## 2020-11-30 MED ORDER — LISINOPRIL 20 MG PO TABS
1.0000 | ORAL_TABLET | Freq: Every day | ORAL | 1 refills | Status: DC
  Filled 2020-11-30: qty 90, 90d supply, fill #0

## 2020-12-03 ENCOUNTER — Other Ambulatory Visit (HOSPITAL_COMMUNITY): Payer: Self-pay

## 2020-12-03 MED FILL — Atorvastatin Calcium Tab 40 MG (Base Equivalent): ORAL | 90 days supply | Qty: 90 | Fill #0 | Status: AC

## 2020-12-03 MED FILL — Verapamil HCl Tab ER 240 MG: ORAL | 90 days supply | Qty: 90 | Fill #0 | Status: AC

## 2020-12-03 MED FILL — Metformin HCl Tab 500 MG: ORAL | 90 days supply | Qty: 90 | Fill #0 | Status: AC

## 2020-12-07 ENCOUNTER — Other Ambulatory Visit (HOSPITAL_COMMUNITY): Payer: Self-pay

## 2020-12-07 MED ORDER — HYDROCODONE-ACETAMINOPHEN 10-325 MG PO TABS
ORAL_TABLET | ORAL | 0 refills | Status: DC
  Filled 2020-12-07 (×2): qty 150, 30d supply, fill #0

## 2020-12-07 MED ORDER — HYDROCODONE-ACETAMINOPHEN 10-325 MG PO TABS
ORAL_TABLET | ORAL | 0 refills | Status: DC
  Filled 2020-12-07: qty 150, 30d supply, fill #0

## 2020-12-10 ENCOUNTER — Other Ambulatory Visit (HOSPITAL_COMMUNITY): Payer: Self-pay

## 2020-12-10 MED ORDER — TOPIRAMATE 50 MG PO TABS
ORAL_TABLET | ORAL | 1 refills | Status: DC
  Filled 2020-12-10: qty 180, 90d supply, fill #0

## 2020-12-10 MED ORDER — DIETHYLPROPION HCL ER 75 MG PO TB24
1.0000 | ORAL_TABLET | Freq: Every day | ORAL | 0 refills | Status: DC
  Filled 2020-12-10: qty 30, 30d supply, fill #0

## 2020-12-10 MED ORDER — RYBELSUS 7 MG PO TABS
ORAL_TABLET | ORAL | 1 refills | Status: DC
  Filled 2020-12-10: qty 90, 90d supply, fill #0

## 2020-12-11 ENCOUNTER — Other Ambulatory Visit (HOSPITAL_COMMUNITY): Payer: Self-pay

## 2020-12-19 ENCOUNTER — Encounter: Payer: Self-pay | Admitting: Physical Medicine & Rehabilitation

## 2020-12-19 ENCOUNTER — Encounter: Payer: Medicare HMO | Attending: Physical Medicine & Rehabilitation | Admitting: Physical Medicine & Rehabilitation

## 2020-12-19 ENCOUNTER — Other Ambulatory Visit: Payer: Self-pay

## 2020-12-19 VITALS — BP 112/78 | HR 77 | Temp 97.9°F | Ht 67.0 in | Wt 225.6 lb

## 2020-12-19 DIAGNOSIS — M75101 Unspecified rotator cuff tear or rupture of right shoulder, not specified as traumatic: Secondary | ICD-10-CM | POA: Diagnosis present

## 2020-12-19 NOTE — Patient Instructions (Signed)
PLEASE FEEL FREE TO CALL OUR OFFICE WITH ANY PROBLEMS OR QUESTIONS ([phone])      

## 2020-12-19 NOTE — Progress Notes (Signed)
PROCEDURE NOTE  DIAGNOSIS: right RTC syndrome  INTERVENTION:  Major joint     After informed consent and preparation of the skin with betadine and isopropyl alcohol, I injected 6mg  (1cc) of celestone and 4cc of 1% lidocaine into the right subacromial space via lateral approach. Additionally, aspiration was performed prior to injection. The patient tolerated well, and no complications were encountered. Afterward the area was cleaned and dressed. Post- injection instructions were provided.      Meredith Staggers, MD, [facility] 12/19/2020

## 2021-01-07 ENCOUNTER — Other Ambulatory Visit (HOSPITAL_COMMUNITY): Payer: Self-pay

## 2021-01-07 MED ORDER — HYDROCODONE-ACETAMINOPHEN 10-325 MG PO TABS
ORAL_TABLET | ORAL | 0 refills | Status: DC
  Filled 2021-01-07: qty 150, 30d supply, fill #0

## 2021-01-07 MED ORDER — DICYCLOMINE HCL 10 MG PO CAPS
ORAL_CAPSULE | ORAL | 0 refills | Status: DC
  Filled 2021-01-07 – 2021-01-15 (×4): qty 120, 15d supply, fill #0

## 2021-01-09 ENCOUNTER — Other Ambulatory Visit (HOSPITAL_COMMUNITY): Payer: Self-pay

## 2021-01-11 ENCOUNTER — Other Ambulatory Visit (HOSPITAL_COMMUNITY): Payer: Self-pay

## 2021-01-15 ENCOUNTER — Other Ambulatory Visit (HOSPITAL_COMMUNITY): Payer: Self-pay

## 2021-01-15 MED FILL — Dapagliflozin Propanediol Tab 10 MG (Base Equivalent): ORAL | 90 days supply | Qty: 90 | Fill #0 | Status: AC

## 2021-01-15 MED FILL — Gabapentin Cap 100 MG: ORAL | 30 days supply | Qty: 60 | Fill #1 | Status: AC

## 2021-01-16 ENCOUNTER — Other Ambulatory Visit (HOSPITAL_COMMUNITY): Payer: Self-pay

## 2021-01-17 ENCOUNTER — Other Ambulatory Visit (HOSPITAL_COMMUNITY): Payer: Self-pay

## 2021-01-23 ENCOUNTER — Ambulatory Visit: Payer: Medicare HMO | Attending: Internal Medicine

## 2021-01-23 DIAGNOSIS — Z23 Encounter for immunization: Secondary | ICD-10-CM

## 2021-01-23 NOTE — Progress Notes (Signed)
   Covid-19 Vaccination Clinic  Name:  Alison Whitehead    MRN: [id number] DOB: 1951/04/01  01/23/2021  [name] was observed post Covid-19 immunization for 15 minutes without incident. She was provided with Vaccine Information Sheet and instruction to access the V-Safe system.   [name] was instructed to call 911 with any severe reactions post vaccine: Difficulty breathing  Swelling of face and throat  A fast heartbeat  A bad rash all over body  Dizziness and weakness   Immunizations Administered     Name Date Dose VIS Date Route   PFIZER Comrnaty(Gray TOP) Covid-19 Vaccine 01/23/2021 10:17 AM 0.3 mL 07/12/2020 Intramuscular   Manufacturer: [place]   Lot: [id number]   [place]: [place], PharmD, MBA Clinical Acute Care Pharmacist

## 2021-01-24 ENCOUNTER — Other Ambulatory Visit (HOSPITAL_COMMUNITY): Payer: Self-pay

## 2021-01-24 MED ORDER — COVID-19 MRNA VAC-TRIS(PFIZER) 30 MCG/0.3ML IM SUSP
INTRAMUSCULAR | 0 refills | Status: DC
  Filled 2021-01-24: qty 0.3, 17d supply, fill #0

## 2021-01-25 ENCOUNTER — Other Ambulatory Visit (HOSPITAL_COMMUNITY): Payer: Self-pay

## 2021-01-25 MED ORDER — RYBELSUS 7 MG PO TABS: ORAL_TABLET | ORAL | 1 refills | Status: DC

## 2021-01-25 MED ORDER — TOPIRAMATE 50 MG PO TABS: ORAL_TABLET | ORAL | 1 refills | Status: DC

## 2021-01-25 MED ORDER — VITAMIN D (ERGOCALCIFEROL) 1.25 MG (50000 UNIT) PO CAPS
ORAL_CAPSULE | ORAL | 1 refills | Status: DC
  Filled 2021-01-25: qty 12, 84d supply, fill #0

## 2021-01-25 MED ORDER — DIETHYLPROPION HCL ER 75 MG PO TB24
1.0000 | ORAL_TABLET | Freq: Every day | ORAL | 0 refills | Status: DC
  Filled 2021-01-25: qty 30, 30d supply, fill #0

## 2021-01-28 ENCOUNTER — Other Ambulatory Visit (HOSPITAL_COMMUNITY): Payer: Self-pay

## 2021-01-29 ENCOUNTER — Other Ambulatory Visit (HOSPITAL_COMMUNITY): Payer: Self-pay

## 2021-01-30 ENCOUNTER — Other Ambulatory Visit (HOSPITAL_COMMUNITY): Payer: Self-pay

## 2021-01-30 MED ORDER — PREDNISONE 5 MG PO TABS
ORAL_TABLET | ORAL | 1 refills | Status: DC
  Filled 2021-01-30: qty 21, 6d supply, fill #0

## 2021-01-31 ENCOUNTER — Other Ambulatory Visit (HOSPITAL_COMMUNITY): Payer: Self-pay

## 2021-02-06 ENCOUNTER — Other Ambulatory Visit (HOSPITAL_COMMUNITY): Payer: Self-pay

## 2021-02-06 MED ORDER — DIAZEPAM 5 MG PO TABS
ORAL_TABLET | ORAL | 0 refills | Status: DC
  Filled 2021-02-06 – 2021-02-07 (×2): qty 10, 5d supply, fill #0

## 2021-02-06 MED ORDER — HYDROCODONE-ACETAMINOPHEN 10-325 MG PO TABS
ORAL_TABLET | ORAL | 0 refills | Status: DC
  Filled 2021-02-06: qty 150, 30d supply, fill #0

## 2021-02-07 ENCOUNTER — Other Ambulatory Visit (HOSPITAL_COMMUNITY): Payer: Self-pay

## 2021-02-20 ENCOUNTER — Other Ambulatory Visit (HOSPITAL_COMMUNITY): Payer: Self-pay

## 2021-02-20 MED ORDER — PEG 3350-KCL-NABCB-NACL-NASULF 236 G PO SOLR
ORAL | 0 refills | Status: DC
  Filled 2021-02-20: qty 4000, 1d supply, fill #0

## 2021-02-21 ENCOUNTER — Other Ambulatory Visit (HOSPITAL_COMMUNITY): Payer: Self-pay

## 2021-02-22 ENCOUNTER — Other Ambulatory Visit (HOSPITAL_COMMUNITY): Payer: Self-pay

## 2021-02-26 ENCOUNTER — Other Ambulatory Visit (HOSPITAL_COMMUNITY): Payer: Self-pay

## 2021-02-26 MED ORDER — VITAMIN D (ERGOCALCIFEROL) 1.25 MG (50000 UNIT) PO CAPS
50000.0000 [IU] | ORAL_CAPSULE | ORAL | 1 refills | Status: DC
  Filled 2021-02-26: qty 12, 84d supply, fill #0
  Filled 2021-06-02: qty 12, 84d supply, fill #1

## 2021-02-26 MED ORDER — DIETHYLPROPION HCL ER 75 MG PO TB24
1.0000 | ORAL_TABLET | Freq: Every day | ORAL | 0 refills | Status: DC
  Filled 2021-02-26 (×2): qty 30, 30d supply, fill #0

## 2021-02-26 MED ORDER — TOPIRAMATE 50 MG PO TABS
50.0000 mg | ORAL_TABLET | Freq: Two times a day (BID) | ORAL | 1 refills | Status: DC
  Filled 2021-02-26: qty 180, 90d supply, fill #0

## 2021-02-26 MED ORDER — RYBELSUS 7 MG PO TABS
7.0000 mg | ORAL_TABLET | Freq: Every evening | ORAL | 1 refills | Status: DC
  Filled 2021-02-26: qty 90, 90d supply, fill #0

## 2021-02-28 ENCOUNTER — Other Ambulatory Visit (HOSPITAL_COMMUNITY): Payer: Self-pay

## 2021-03-01 ENCOUNTER — Other Ambulatory Visit (HOSPITAL_COMMUNITY): Payer: Self-pay

## 2021-03-01 MED ORDER — ATORVASTATIN CALCIUM 40 MG PO TABS
40.0000 mg | ORAL_TABLET | Freq: Every day | ORAL | 1 refills | Status: DC
  Filled 2021-03-01: qty 90, 90d supply, fill #0
  Filled 2021-06-02: qty 90, 90d supply, fill #1

## 2021-03-01 MED ORDER — LISINOPRIL 20 MG PO TABS
20.0000 mg | ORAL_TABLET | Freq: Every day | ORAL | 1 refills | Status: DC
  Filled 2021-03-01: qty 90, 90d supply, fill #0
  Filled 2021-05-23: qty 90, 90d supply, fill #1

## 2021-03-05 ENCOUNTER — Other Ambulatory Visit (HOSPITAL_COMMUNITY): Payer: Self-pay

## 2021-03-06 ENCOUNTER — Other Ambulatory Visit (HOSPITAL_COMMUNITY): Payer: Self-pay

## 2021-03-07 ENCOUNTER — Other Ambulatory Visit (HOSPITAL_COMMUNITY): Payer: Self-pay

## 2021-03-07 MED ORDER — METFORMIN HCL 500 MG PO TABS
500.0000 mg | ORAL_TABLET | Freq: Every evening | ORAL | 1 refills | Status: DC
  Filled 2021-03-07: qty 90, 90d supply, fill #0

## 2021-03-08 ENCOUNTER — Other Ambulatory Visit (HOSPITAL_COMMUNITY): Payer: Self-pay

## 2021-03-08 MED ORDER — HYDROCODONE-ACETAMINOPHEN 10-325 MG PO TABS
1.0000 | ORAL_TABLET | Freq: Every day | ORAL | 0 refills | Status: DC
  Filled 2021-03-08: qty 150, 30d supply, fill #0

## 2021-03-11 ENCOUNTER — Other Ambulatory Visit (HOSPITAL_COMMUNITY): Payer: Self-pay

## 2021-03-12 ENCOUNTER — Other Ambulatory Visit (HOSPITAL_COMMUNITY): Payer: Self-pay

## 2021-03-12 MED ORDER — ALBUTEROL SULFATE HFA 108 (90 BASE) MCG/ACT IN AERS
1.0000 | INHALATION_SPRAY | Freq: Four times a day (QID) | RESPIRATORY_TRACT | 5 refills | Status: DC | PRN
  Filled 2021-03-12: qty 8.5, 50d supply, fill #0

## 2021-03-13 ENCOUNTER — Other Ambulatory Visit (HOSPITAL_COMMUNITY): Payer: Self-pay

## 2021-03-14 ENCOUNTER — Other Ambulatory Visit (HOSPITAL_COMMUNITY): Payer: Self-pay

## 2021-03-15 ENCOUNTER — Other Ambulatory Visit (HOSPITAL_COMMUNITY): Payer: Self-pay

## 2021-03-18 ENCOUNTER — Other Ambulatory Visit (HOSPITAL_COMMUNITY): Payer: Self-pay

## 2021-03-18 MED ORDER — AMLODIPINE BESYLATE 10 MG PO TABS
10.0000 mg | ORAL_TABLET | Freq: Every day | ORAL | 3 refills | Status: DC
  Filled 2021-03-18: qty 90, 90d supply, fill #0

## 2021-03-19 ENCOUNTER — Other Ambulatory Visit (HOSPITAL_COMMUNITY): Payer: Self-pay

## 2021-03-20 ENCOUNTER — Encounter: Payer: Self-pay | Admitting: Physical Medicine & Rehabilitation

## 2021-03-20 ENCOUNTER — Other Ambulatory Visit (HOSPITAL_COMMUNITY): Payer: Self-pay

## 2021-03-20 ENCOUNTER — Other Ambulatory Visit: Payer: Self-pay

## 2021-03-20 ENCOUNTER — Encounter: Payer: Medicare HMO | Attending: Physical Medicine & Rehabilitation | Admitting: Physical Medicine & Rehabilitation

## 2021-03-20 VITALS — BP 118/77 | HR 89 | Temp 98.6°F | Ht 67.0 in | Wt 226.0 lb

## 2021-03-20 DIAGNOSIS — M1712 Unilateral primary osteoarthritis, left knee: Secondary | ICD-10-CM | POA: Insufficient documentation

## 2021-03-20 DIAGNOSIS — I6350 Cerebral infarction due to unspecified occlusion or stenosis of unspecified cerebral artery: Secondary | ICD-10-CM | POA: Diagnosis not present

## 2021-03-20 MED ORDER — DICYCLOMINE HCL 10 MG PO CAPS
10.0000 mg | ORAL_CAPSULE | Freq: Four times a day (QID) | ORAL | 0 refills | Status: DC
  Filled 2021-03-20: qty 92, 11d supply, fill #0
  Filled 2021-03-20: qty 28, 4d supply, fill #0

## 2021-03-20 NOTE — Patient Instructions (Addendum)
PLEASE FEEL FREE TO CALL OUR OFFICE WITH ANY PROBLEMS OR QUESTIONS [phone])   SUPPLEMENTS USEFUL FOR OSTEOARTHRITIS: OMEGA 3 FATTY ACIDS, TURMERIC, GINGER, TART CHERRY EXTRACT, CELERY SEED, GLUCOSAMINE WITH CHONDROITIN     ICY HOT WITH LIDOCAINE, ?SALONPAS

## 2021-03-20 NOTE — Progress Notes (Signed)
Subjective:    Patient ID: Alison Whitehead, female    DOB: 01-23-1951, 70 y.o.   MRN: [id number]  HPI  Alison Whitehead is here in follow up of her right pontine infarct and associated deficits. She had good results with the right shoulder injection and left knee synvisc. The knee more recently has been aching. The pain can bother her at night and affect her sleep. She is wearing a knee brace at night and when she walks.  She uses a cane for balance.  Currently she said no pain at all in her right shoulder.  She does get some exercise but feels a bit limited due to pain.  She uses her cane for balance.  She denies any recent falls or mishaps.  Medically she is especially reported no recent problems or changes.  Weight is stable.  Reports normal sleep.      Pain Inventory Average Pain 7 Pain Right Now 7 My pain is constant  In the last 24 hours, has pain interfered with the following? General activity 6 Relation with others 6 Enjoyment of life 6 What TIME of day is your pain at its worst? evening Sleep (in general) Fair  Pain is worse with: some activites Pain improves with: rest and medication Relief from Meds: 7  Family History  Problem Relation Age of Onset   Breast cancer Maternal Aunt    Diabetes Mother    Hypertension Mother    Glaucoma Mother    Other Father        accident   Diabetes Sister    Hypertension Sister    Cancer Sister    Colon cancer Neg Hx    Colon polyps Neg Hx    Esophageal cancer Neg Hx    Stomach cancer Neg Hx    Rectal cancer Neg Hx    Social History   Socioeconomic History   Marital status: Widowed    Spouse name: Not on file   Number of children: 2   Years of education: Not on file   Highest education level: Some college, no degree  Occupational History    Comment: retired  Tobacco Use   Smoking status: Former    Types: Cigarettes    Quit date: 10/13/2019    Years since quitting: 1.4   Smokeless tobacco: Never  Vaping Use    Vaping Use: Never used  Substance and Sexual Activity   Alcohol use: Never   Drug use: Never   Sexual activity: Not Currently    Birth control/protection: None  Other Topics Concern   Not on file  Social History Narrative   12/13/19 Lives alone   No caffeine   Social Determinants of Radio broadcast assistant Strain: Not on file  Food Insecurity: Not on file  Transportation Needs: Not on file  Physical Activity: Not on file  Stress: Not on file  Social Connections: Not on file   Past Surgical History:  Procedure Laterality Date   SHOULDER SURGERY Left    lipoma   Past Surgical History:  Procedure Laterality Date   SHOULDER SURGERY Left    lipoma   Past Medical History:  Diagnosis Date   Allergy    seasonal   Arthritis    DDD (degenerative disc disease)    Degenerative disc disease, lumbar    Hypertension    IBS (irritable bowel syndrome)    Osteopenia    Osteoporosis    osteopenia   Stroke ([place]) 09/2019   Temp 98.6 F (  37 C) (Oral)   Ht '5\' 7"'$  (1.702 m)   Wt 226 lb (102.5 kg)   BMI 35.40 kg/m   Opioid Risk Score:   Fall Risk Score:  `1  Depression screen PHQ 2/9  Depression screen [facility] 2/9 12/19/2020 10/17/2020 04/18/2020  Decreased Interest 0 0 0  Down, Depressed, Hopeless 0 0 0  PHQ - 2 Score 0 0 0      Review of Systems  Constitutional: Negative.   HENT: Negative.    Eyes: Negative.   Respiratory: Negative.    Cardiovascular: Negative.   Endocrine: Negative.   Genitourinary: Negative.   Musculoskeletal:  Positive for back pain and gait problem.  Skin: Negative.   Allergic/Immunologic: Negative.   Hematological: Negative.   Psychiatric/Behavioral: Negative.        Objective:   Physical Exam General: No acute distress HEENT: NCAT, EOMI, oral membranes moist Cards: reg rate  Chest: normal effort Abdomen: Soft, NT, ND Skin: dry, intact Extremities: no edema Psych: pleasant and appropriate  Neuro: Alert and oriented x 3. Normal insight  and awareness. Intact Memory. Normal language and speech. Cranial nerve exam unremarkable.  Sensory exam is normal. Reflexes are 2+ in all 4's. Fine motor coordination is intact. No tremors. Motor function is grossly 5/5 RUE and RLE 5/5.  .  musculoskeletal:  normal right shouldr rom. Left knee with mild jt line pain and minimal crepitus and swelling today. Uses cane to off load left side.               Assessment & Plan:  Medical Problem List and Plan: 1.  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 2.  Post- stroke HA:             -Continue Topamax 50 mg twice daily for headaches 3. HTN:  This is followed by cardiology and primary physician. Much improved 4.  Right rotator cuff tendinitis: HEP             -the injection helped quite a bit.              -discussed importance of scapular rom. 5. OA bilateral knees: continue Voltaren gel to knees             -supplements provided for OA provided             -some benefit with  synvisc injection  -knee exercises provided  -?zilretta   Fifteen minutes of face to face patient care time were spent during this visit. All questions were encouraged and answered.  Follow up with me in 4 MOS .

## 2021-03-21 ENCOUNTER — Other Ambulatory Visit (HOSPITAL_COMMUNITY): Payer: Self-pay

## 2021-03-22 ENCOUNTER — Other Ambulatory Visit (HOSPITAL_COMMUNITY): Payer: Self-pay

## 2021-03-22 MED ORDER — VERAPAMIL HCL ER 240 MG PO TBCR
240.0000 mg | EXTENDED_RELEASE_TABLET | Freq: Every day | ORAL | 1 refills | Status: DC
  Filled 2021-03-22: qty 90, 90d supply, fill #0
  Filled 2021-07-01: qty 90, 90d supply, fill #1

## 2021-03-22 MED ORDER — METOPROLOL SUCCINATE ER 25 MG PO TB24
25.0000 mg | ORAL_TABLET | Freq: Every day | ORAL | 1 refills | Status: DC
  Filled 2021-03-22: qty 90, 90d supply, fill #0
  Filled 2021-05-20: qty 90, 90d supply, fill #1

## 2021-04-23 ENCOUNTER — Other Ambulatory Visit (HOSPITAL_COMMUNITY): Payer: Self-pay

## 2021-04-24 ENCOUNTER — Other Ambulatory Visit (HOSPITAL_COMMUNITY): Payer: Self-pay

## 2021-05-02 ENCOUNTER — Other Ambulatory Visit (HOSPITAL_COMMUNITY): Payer: Self-pay

## 2021-05-02 MED FILL — Dapagliflozin Propanediol Tab 10 MG (Base Equivalent): ORAL | 90 days supply | Qty: 90 | Fill #1 | Status: AC

## 2021-05-03 ENCOUNTER — Other Ambulatory Visit (HOSPITAL_COMMUNITY): Payer: Self-pay

## 2021-05-08 ENCOUNTER — Other Ambulatory Visit (HOSPITAL_COMMUNITY): Payer: Self-pay

## 2021-05-11 IMAGING — US US RENAL
1 series · 14 of 25 positions shown · non-contrast
Comparison: 09/30/2019 CT abdomen/pelvis.

CLINICAL DATA: Stage 3 chronic kidney disease. Hypertension. Type 2
diabetes mellitus.

EXAM:
RENAL / URINARY TRACT ULTRASOUND COMPLETE

[Series 1: us renal · 0.28mm/px · 14 of 36 slices shown]
[im 1/36]
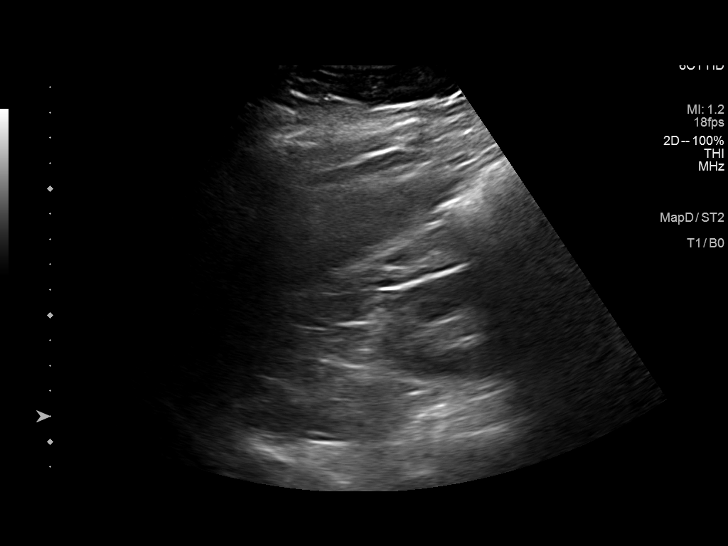
[im 3/36]
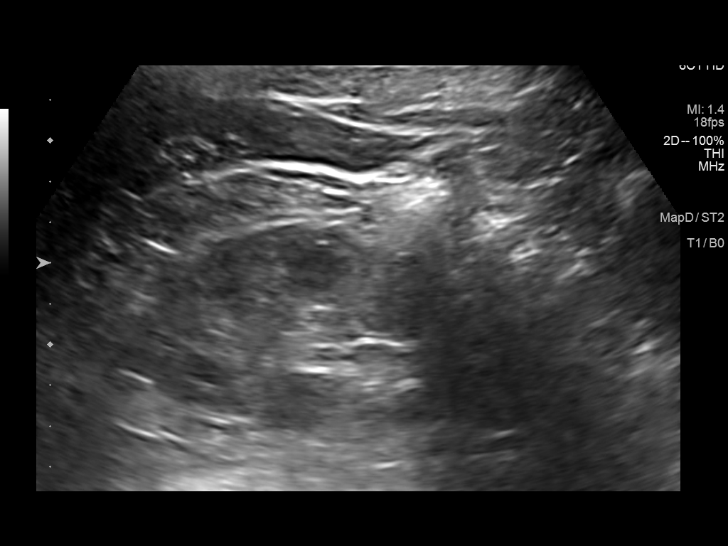
[im 6/36]
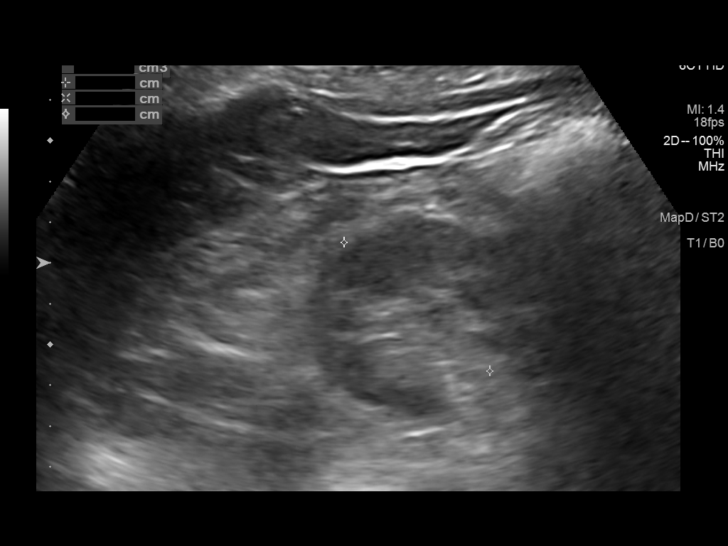
[im 9/36]
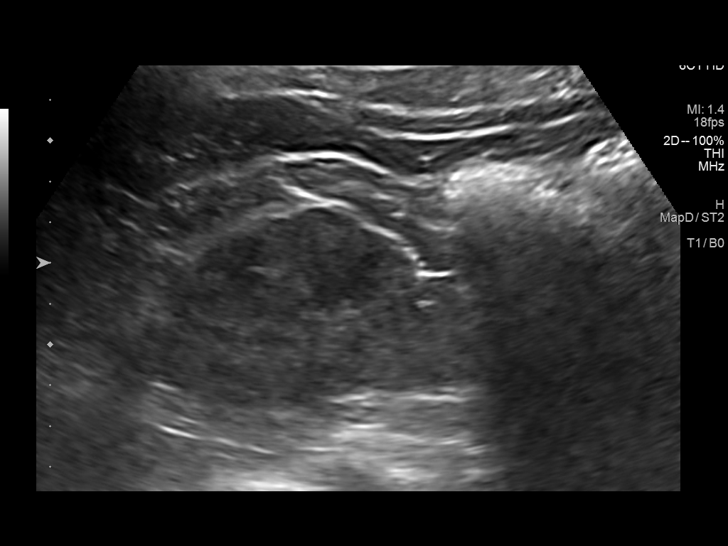
[im 12/36]
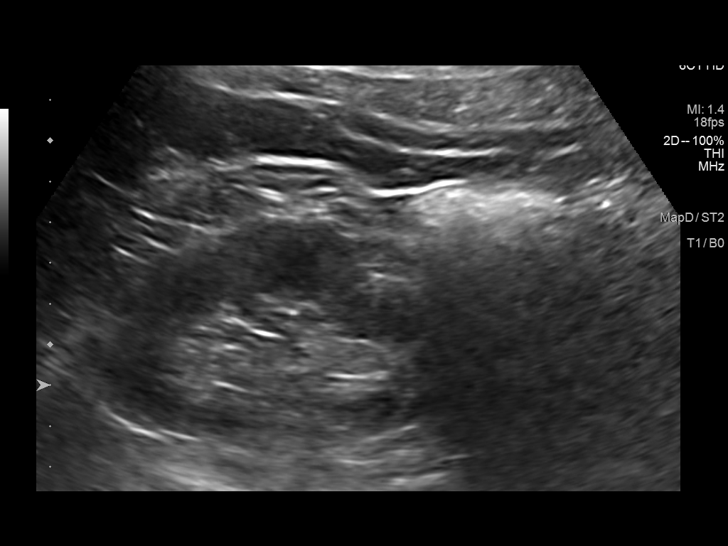
[im 14/36]
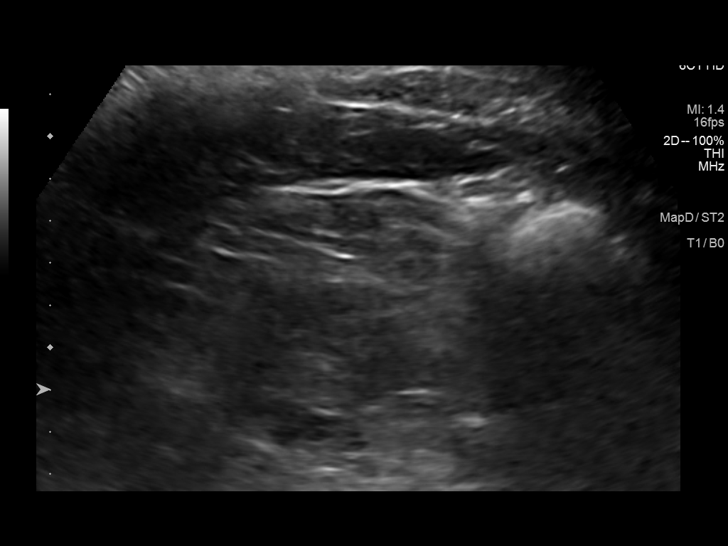
[im 17/36]
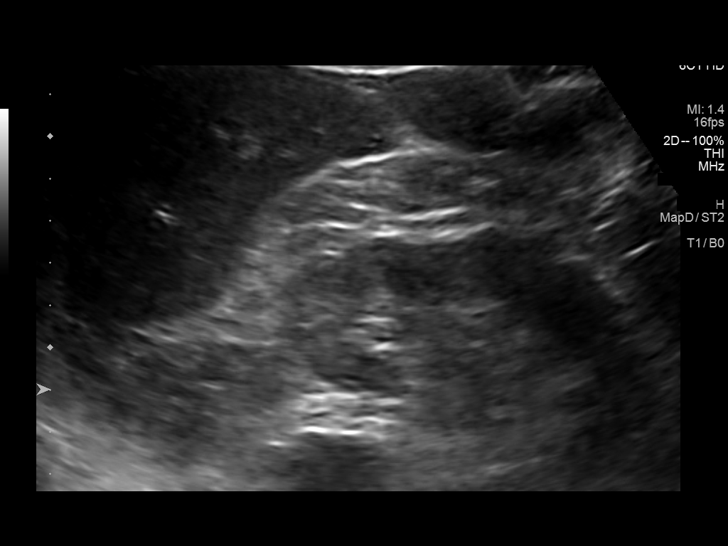
[im 19/36]
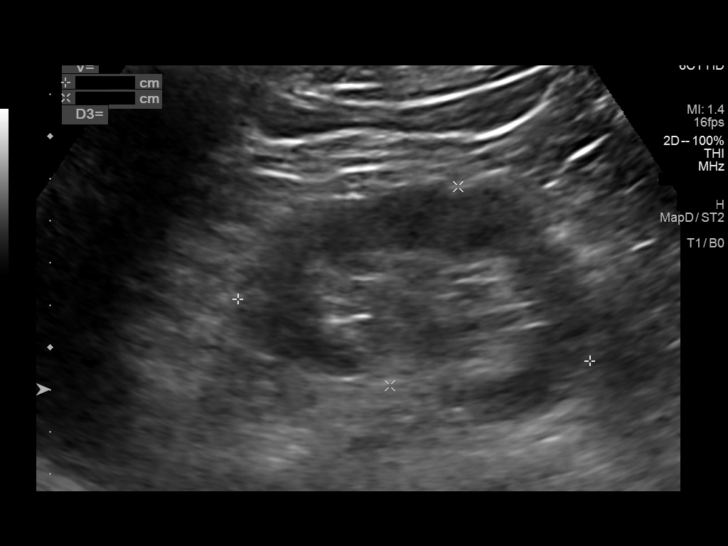
[im 22/36]
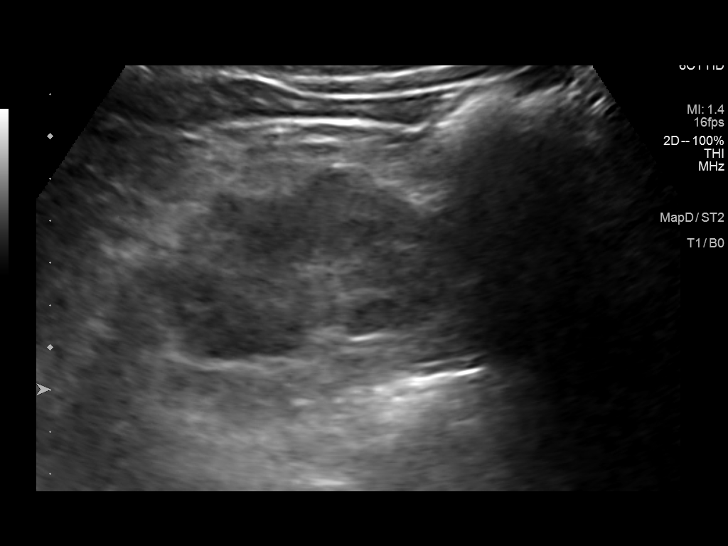
[im 24/36]
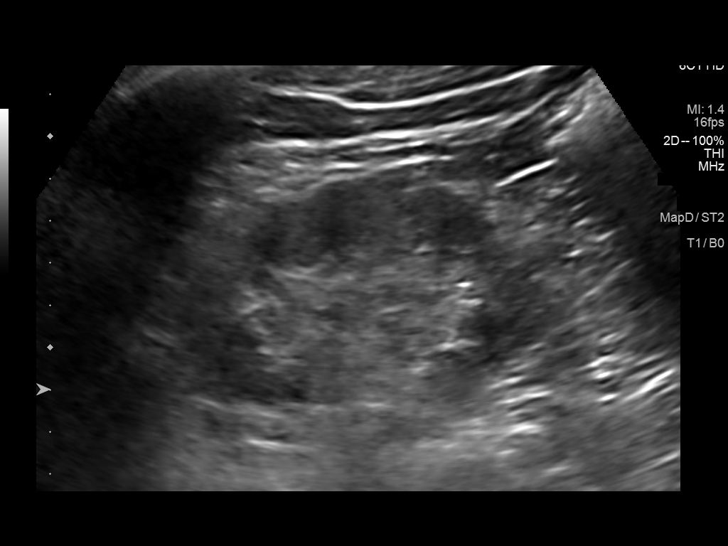
[im 27/36]
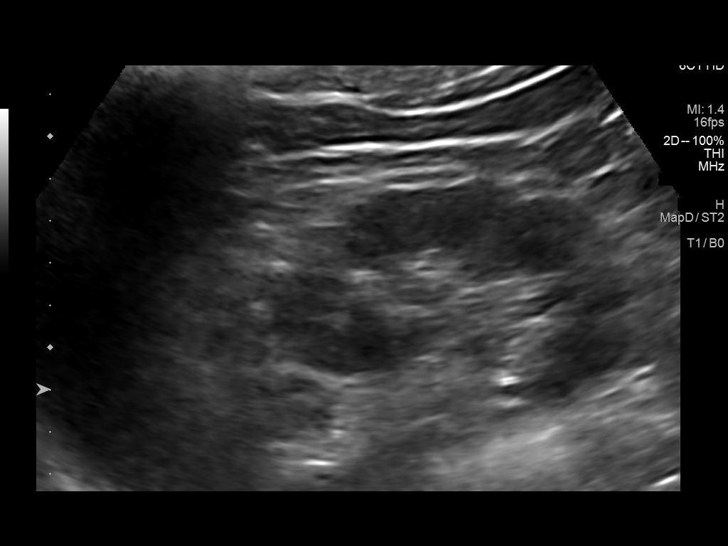
[im 30/36]
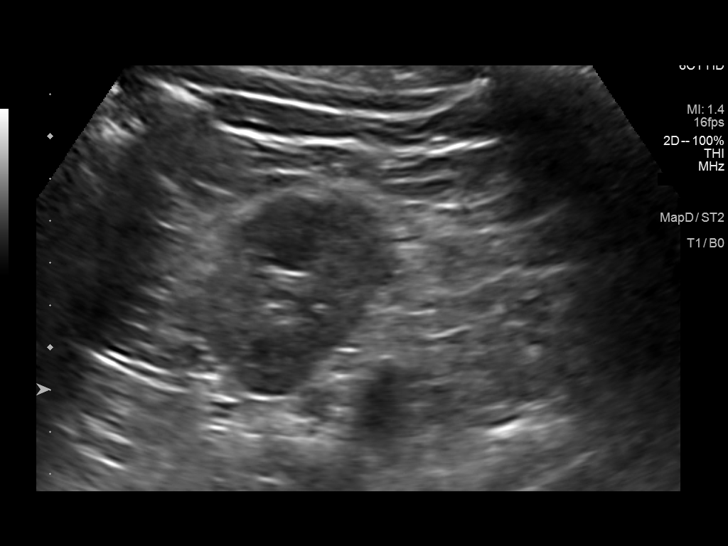
[im 33/36]
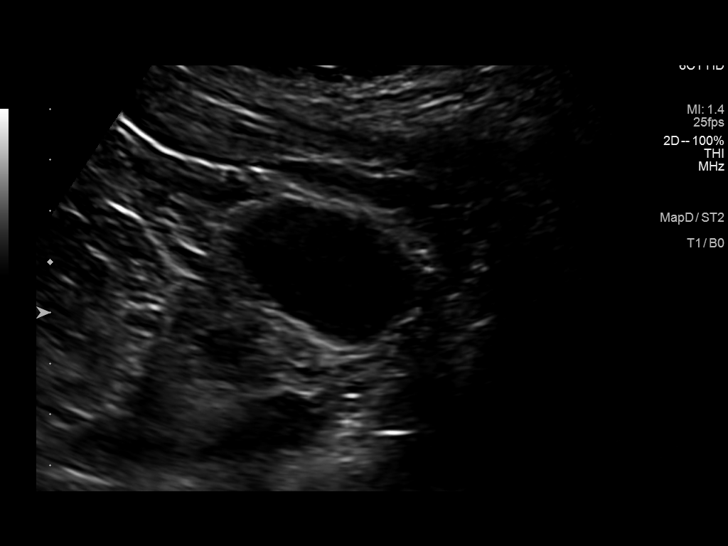
[im 36/36]
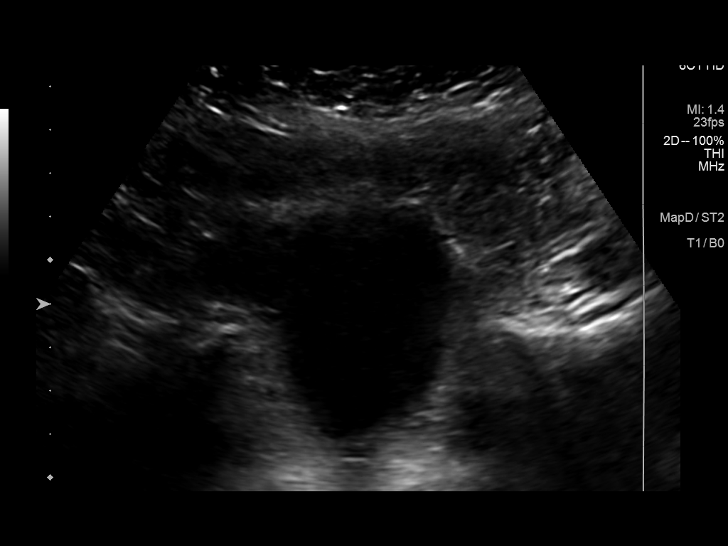

[14 of 25 positions shown; findings below may reference images not displayed]

FINDINGS: Right Kidney:

Renal measurements: 8.6 x 5.2 x 4.8 cm = volume: 112 mL. Echogenic
right renal parenchyma, normal thickness. No hydronephrosis. No
renal mass.

Left Kidney:

Renal measurements: 8.5 x 5.0 x 4.2 cm = volume: 92 mL. Echogenic
left renal parenchyma, normal thickness. No hydronephrosis. No renal
mass.

Bladder:

Appears normal for degree of bladder distention.

Other:

None.
IMPRESSION: 1. Symmetric small echogenic kidneys, compatible with reported
nonspecific chronic renal parenchymal disease. No hydronephrosis.
2. Normal bladder.

## 2021-05-16 ENCOUNTER — Other Ambulatory Visit (HOSPITAL_COMMUNITY): Payer: Self-pay

## 2021-05-17 ENCOUNTER — Other Ambulatory Visit (HOSPITAL_COMMUNITY): Payer: Self-pay

## 2021-05-20 ENCOUNTER — Telehealth: Payer: Self-pay | Admitting: Physical Medicine & Rehabilitation

## 2021-05-20 ENCOUNTER — Other Ambulatory Visit (HOSPITAL_COMMUNITY): Payer: Self-pay

## 2021-05-20 NOTE — Telephone Encounter (Signed)
Pateint called and would like to schedule SYNVISC for left knee. Please advise

## 2021-05-22 ENCOUNTER — Other Ambulatory Visit (HOSPITAL_COMMUNITY): Payer: Self-pay

## 2021-05-22 ENCOUNTER — Other Ambulatory Visit: Payer: Self-pay

## 2021-05-22 ENCOUNTER — Encounter: Payer: Self-pay | Admitting: Physical Medicine & Rehabilitation

## 2021-05-22 ENCOUNTER — Encounter: Payer: Medicare Other | Attending: Physical Medicine & Rehabilitation | Admitting: Physical Medicine & Rehabilitation

## 2021-05-22 VITALS — BP 113/73 | HR 73 | Temp 98.5°F | Ht 67.0 in | Wt 230.4 lb

## 2021-05-22 DIAGNOSIS — M1712 Unilateral primary osteoarthritis, left knee: Secondary | ICD-10-CM | POA: Diagnosis not present

## 2021-05-22 NOTE — Patient Instructions (Signed)
PLEASE FEEL FREE TO CALL OUR OFFICE WITH ANY PROBLEMS OR QUESTIONS ([phone])      

## 2021-05-22 NOTE — Progress Notes (Signed)
PROCEDURE NOTE  DIAGNOSIS: OA left knee  INTERVENTION:  Synvisc One Injection     After informed consent and preparation of the skin with betadine and isopropyl alcohol, I injected 53ml of Synvisc One into the left knee via anterolateral approach. Additionally, aspiration was performed prior to injection. The patient tolerated well, and no complications were encountered. Afterward the area was cleaned and dressed. Post- injection instructions were provided.      Meredith Staggers, MD, [facility] 05/22/2021

## 2021-05-23 ENCOUNTER — Other Ambulatory Visit (HOSPITAL_COMMUNITY): Payer: Self-pay

## 2021-05-23 MED ORDER — AMLODIPINE BESYLATE 10 MG PO TABS
10.0000 mg | ORAL_TABLET | Freq: Every day | ORAL | 3 refills | Status: DC
  Filled 2021-05-23: qty 90, 90d supply, fill #0

## 2021-05-24 ENCOUNTER — Other Ambulatory Visit (HOSPITAL_COMMUNITY): Payer: Self-pay

## 2021-05-24 MED ORDER — METOPROLOL SUCCINATE ER 25 MG PO TB24
25.0000 mg | ORAL_TABLET | Freq: Every day | ORAL | 2 refills | Status: DC
  Filled 2021-05-24: qty 90, 90d supply, fill #0
  Filled 2021-06-02: qty 90, 90d supply, fill #1

## 2021-06-03 ENCOUNTER — Other Ambulatory Visit (HOSPITAL_COMMUNITY): Payer: Self-pay

## 2021-06-04 ENCOUNTER — Other Ambulatory Visit (HOSPITAL_COMMUNITY): Payer: Self-pay

## 2021-07-01 ENCOUNTER — Other Ambulatory Visit (HOSPITAL_COMMUNITY): Payer: Self-pay

## 2021-07-12 ENCOUNTER — Other Ambulatory Visit (HOSPITAL_COMMUNITY): Payer: Self-pay

## 2021-07-17 ENCOUNTER — Ambulatory Visit: Payer: Medicare HMO | Admitting: Physical Medicine & Rehabilitation

## 2021-07-23 ENCOUNTER — Other Ambulatory Visit: Payer: Self-pay

## 2021-07-23 ENCOUNTER — Ambulatory Visit (INDEPENDENT_AMBULATORY_CARE_PROVIDER_SITE_OTHER): Payer: Medicare Other | Admitting: Orthopaedic Surgery

## 2021-07-23 ENCOUNTER — Other Ambulatory Visit (HOSPITAL_COMMUNITY): Payer: Self-pay

## 2021-07-23 ENCOUNTER — Ambulatory Visit (INDEPENDENT_AMBULATORY_CARE_PROVIDER_SITE_OTHER): Payer: Medicare Other

## 2021-07-23 ENCOUNTER — Encounter: Payer: Self-pay | Admitting: Orthopaedic Surgery

## 2021-07-23 DIAGNOSIS — G8929 Other chronic pain: Secondary | ICD-10-CM

## 2021-07-23 DIAGNOSIS — M1712 Unilateral primary osteoarthritis, left knee: Secondary | ICD-10-CM

## 2021-07-23 DIAGNOSIS — M25562 Pain in left knee: Secondary | ICD-10-CM | POA: Diagnosis not present

## 2021-07-23 NOTE — Progress Notes (Signed)
Office Visit Note   Patient: Alison Whitehead           Date of Birth: 09-07-1950           MRN: [id number] Visit Date: 07/23/2021              Requested by: Fredrich Romans, [place] [phone],  [place] [id number] PCP: Fredrich Romans, PA   Assessment & Plan: Visit Diagnoses:  1. Chronic pain of left knee   2. Unilateral primary osteoarthritis, left knee     Plan: Alison Whitehead was last seen in 2019 for evaluation of her left knee osteoarthritis.  At that time she had end-stage changes.  I have not seen her since but she has been followed by Dr. Tessa Lerner with multiple injections of cortisone and more recently Synvisc 1.  About 3 weeks ago she had an exacerbation of her pain and was seen by her primary care physician.  She also was having some swelling of her left lower extremity.  They apparently performed an ultrasound but we cannot retrieve the results.  Alison Whitehead will have to go to the office and sign a release form.  I do not think she has DVT but that certainly one of the differential diagnoses.  She does have advanced arthritic changes in her knee by films today.  There has been progression since her last visit and she does have an effusion.  She has been receiving a lot of cortisone and I am concerned about yet another injection.  She should be wearing a support stocking and her knee support and continue with the cane.  She will retrieve the results and bring in with the office tomorrow.  Long discussion regarding total knee replacement which I think would be the definitive procedure for her.  She is just not ready  Follow-Up Instructions: Return if symptoms worsen or fail to improve.   Orders:  Orders Placed This Encounter  Procedures   XR KNEE 3 VIEW LEFT   No orders of the defined types were placed in this encounter.     Procedures: No procedures performed   Clinical Data: No additional findings.   Subjective: Chief Complaint  Patient presents  with   Left Knee - Pain  Patient presents today for left knee pain. She said that it has been hurting for two weeks.No known injury. She said that it hurts all throughout, but worse laterally. She has stated her her entire left leg has been swollen. She went to [facility] and they did an ultrasound, but she did not know anything more. She takes hydrocodone for pain relief. Has seen Dr. Tessa Lerner at [facility] rehab on multiple occasions over the past several years.  She has had at least 3 cortisone injections in the left knee this year and more recently a Synvisc 1 injection in October.  Still having considerable discomfort.  Within the last several weeks she has developed some swelling in her left lower extremity and was evaluated by her primary care physician.  She has had an ultrasound but we were unable to retrieve the results.  Alison Whitehead will go to a primary care physician's office and pick that up for [place].  No history of injury or trauma.  No fever or chills  HPI  Review of Systems   Objective: Vital Signs: There were no vitals taken for this visit.  Physical Exam Constitutional:      Appearance: She is well-developed.  Eyes:  Pupils: Pupils are equal, round, and reactive to light.  Pulmonary:     Effort: Pulmonary effort is normal.  Skin:    General: Skin is warm and dry.  Neurological:     Mental Status: She is alert and oriented to person, place, and time.  Psychiatric:        Behavior: Behavior normal.    Ortho Exam poor historian.  Does not remember the injections in her left knee by Dr. Tessa Lerner over the past year.  Left knee was warm but did have a positive effusion and multiple areas of tenderness.  She also had multiple areas of tenderness about her left leg with some nonpitting edema beginning about the mid left leg into her foot.  She relates that there is less swelling than there was several weeks ago.  There was some calf pain.  Pes planus is apparent.   Some swelling about the ankle as well.  Negative Homan.  Might have a popliteal cyst.  Some of the swelling in her calf might be related to a ruptured cyst.  No instability.  Looks like she has full extension of her left knee and about 100 degrees of flexion without instability  Specialty Comments:  No specialty comments available.  Imaging: XR KNEE 3 VIEW LEFT  Result Date: 07/23/2021 Films of the left knee were obtained in 3 projections standing.  There are end-stage osteoarthritic changes in the medial lateral compartments with about 1 degree of varus.  There is no CPPD or acute changes.  Advanced degenerative changes also identified at the patellofemoral joint.  Medial lateral joint spaces are essentially bone-on-bone    PMFS History: Patient Active Problem List   Diagnosis Date Noted   Rotator cuff syndrome, right 01/11/2020   Labile blood glucose    Vascular headache    Small vessel disease, cerebrovascular    Prediabetes    AKI (acute kidney injury) ([place])    Right pontine stroke ([place]) 10/06/2019   Acute cerebrovascular accident (CVA) ([place]) 09/30/2019   Unilateral primary osteoarthritis, left knee 03/17/2019   Unilateral primary osteoarthritis, right knee 03/17/2019   Bilateral primary osteoarthritis of knee 03/10/2019   INSOMNIA, CHRONIC 05/19/2007   SPONDYLOSIS, LUMBAR 05/19/2007   Essential hypertension 05/03/2007   ARTHRITIS, KNEE 05/03/2007   Past Medical History:  Diagnosis Date   Allergy    seasonal   Arthritis    DDD (degenerative disc disease)    Degenerative disc disease, lumbar    Hypertension    IBS (irritable bowel syndrome)    Osteopenia    Osteoporosis    osteopenia   Stroke ([place]) 09/2019    Family History  Problem Relation Age of Onset   Breast cancer Maternal Aunt    Diabetes Mother    Hypertension Mother    Glaucoma Mother    Other Father        accident   Diabetes Sister    Hypertension Sister    Cancer Sister    Colon cancer Neg Hx     Colon polyps Neg Hx    Esophageal cancer Neg Hx    Stomach cancer Neg Hx    Rectal cancer Neg Hx     Past Surgical History:  Procedure Laterality Date   SHOULDER SURGERY Left    lipoma   Social History   Occupational History    Comment: retired  Tobacco Use   Smoking status: Former    Types: Cigarettes    Quit date: 10/13/2019    Years  since quitting: 1.7   Smokeless tobacco: Never  Vaping Use   Vaping Use: Never used  Substance and Sexual Activity   Alcohol use: Never   Drug use: Never   Sexual activity: Not Currently    Birth control/protection: None

## 2021-08-15 ENCOUNTER — Other Ambulatory Visit (HOSPITAL_COMMUNITY): Payer: Self-pay

## 2021-08-20 ENCOUNTER — Other Ambulatory Visit (HOSPITAL_COMMUNITY): Payer: Self-pay

## 2021-08-20 MED FILL — Dapagliflozin Propanediol Tab 10 MG (Base Equivalent): ORAL | 90 days supply | Qty: 90 | Fill #0 | Status: CN

## 2021-08-29 ENCOUNTER — Other Ambulatory Visit (HOSPITAL_COMMUNITY): Payer: Self-pay

## 2021-09-16 ENCOUNTER — Other Ambulatory Visit (HOSPITAL_COMMUNITY): Payer: Self-pay

## 2021-09-18 ENCOUNTER — Other Ambulatory Visit: Payer: Self-pay

## 2021-09-18 ENCOUNTER — Encounter: Payer: Self-pay | Admitting: Physical Medicine & Rehabilitation

## 2021-09-18 ENCOUNTER — Encounter: Payer: Medicare Other | Attending: Physical Medicine & Rehabilitation | Admitting: Physical Medicine & Rehabilitation

## 2021-09-18 VITALS — BP 133/89 | HR 85 | Temp 97.6°F | Ht 67.0 in | Wt 222.0 lb

## 2021-09-18 DIAGNOSIS — M1712 Unilateral primary osteoarthritis, left knee: Secondary | ICD-10-CM | POA: Diagnosis present

## 2021-09-18 DIAGNOSIS — I6350 Cerebral infarction due to unspecified occlusion or stenosis of unspecified cerebral artery: Secondary | ICD-10-CM | POA: Insufficient documentation

## 2021-09-18 DIAGNOSIS — M47817 Spondylosis without myelopathy or radiculopathy, lumbosacral region: Secondary | ICD-10-CM | POA: Insufficient documentation

## 2021-09-18 NOTE — Patient Instructions (Addendum)
SUPPLEMENTS USEFUL FOR OSTEOARTHRITIS: OMEGA 3 FATTY ACIDS, TURMERIC, GINGER, TART CHERRY EXTRACT, CELERY SEED, GLUCOSAMINE WITH CHONDROITIN     ZILRETTA:  IT'S A LONG ACTING STEROID.

## 2021-09-18 NOTE — Progress Notes (Signed)
Subjective:    Patient ID: Alison Whitehead, female    DOB: Feb 01, 1951, 71 y.o.   MRN: [id number]  HPI  Alison Whitehead is here in follow up of her gait disorder and chronic pain. We injected her left knee with synvisc in October 22 which provided relief for a couple months. The knee is acting up again and she has discussed TKA with Dr. Durward Fortes given her endstage OA.  She is a bit hesitant to proceed with the knee surgery as she is afraid of the burden she put on her family during the recovery period.  She has been trying to be more active in general.  She uses a cane for balance.  She likes to spend time with her family.  She is eating better and trying to live more healthy in general.    Pain Inventory Average Pain 8 Pain Right Now 8 My pain is sharp  In the last 24 hours, has pain interfered with the following? General activity 6 Relation with others 4 Enjoyment of life 4 What TIME of day is your pain at its worst? daytime Sleep (in general) Fair  Pain is worse with: walking and some activites Pain improves with: rest and medication Relief from Meds: 8  Family History  Problem Relation Age of Onset   Breast cancer Maternal Aunt    Diabetes Mother    Hypertension Mother    Glaucoma Mother    Other Father        accident   Diabetes Sister    Hypertension Sister    Cancer Sister    Colon cancer Neg Hx    Colon polyps Neg Hx    Esophageal cancer Neg Hx    Stomach cancer Neg Hx    Rectal cancer Neg Hx    Social History   Socioeconomic History   Marital status: Widowed    Spouse name: Not on file   Number of children: 2   Years of education: Not on file   Highest education level: Some college, no degree  Occupational History    Comment: retired  Tobacco Use   Smoking status: Former    Types: Cigarettes    Quit date: 10/13/2019    Years since quitting: 1.9   Smokeless tobacco: Never  Vaping Use   Vaping Use: Never used  Substance and Sexual Activity    Alcohol use: Never   Drug use: Never   Sexual activity: Not Currently    Birth control/protection: None  Other Topics Concern   Not on file  Social History Narrative   12/13/19 Lives alone   No caffeine   Social Determinants of Radio broadcast assistant Strain: Not on file  Food Insecurity: Not on file  Transportation Needs: Not on file  Physical Activity: Not on file  Stress: Not on file  Social Connections: Not on file   Past Surgical History:  Procedure Laterality Date   SHOULDER SURGERY Left    lipoma   Past Surgical History:  Procedure Laterality Date   SHOULDER SURGERY Left    lipoma   Past Medical History:  Diagnosis Date   Allergy    seasonal   Arthritis    DDD (degenerative disc disease)    Degenerative disc disease, lumbar    Hypertension    IBS (irritable bowel syndrome)    Osteopenia    Osteoporosis    osteopenia   Stroke ([place]) 09/2019   BP 133/89    Pulse 85    Temp  97.6 F (36.4 C)    Ht 5\' 7"  (1.702 m)    Wt 222 lb (100.7 kg)    SpO2 95%    BMI 34.77 kg/m   Opioid Risk Score:   Fall Risk Score:  `1  Depression screen PHQ 2/9  Depression screen [facility] 2/9 09/18/2021 03/20/2021 12/19/2020 10/17/2020 04/18/2020  Decreased Interest 0 0 0 0 0  Down, Depressed, Hopeless 0 0 0 0 0  PHQ - 2 Score 0 0 0 0 0     Review of Systems  Constitutional: Negative.   HENT: Negative.    Eyes: Negative.   Respiratory: Negative.    Cardiovascular: Negative.   Gastrointestinal: Negative.   Endocrine: Negative.   Genitourinary: Negative.   Musculoskeletal:  Positive for back pain and gait problem.  Skin: Negative.   Allergic/Immunologic: Negative.   Neurological:  Positive for weakness.  Hematological: Negative.   Psychiatric/Behavioral: Negative.        Objective:   Physical Exam General: No acute distress, obese HEENT: NCAT, EOMI, oral membranes moist Cards: reg rate  Chest: normal effort Abdomen: Soft, NT, ND Skin: dry, intact Extremities: no  edema Psych: pleasant and appropriate  Neuro: Alert and oriented x 3. Normal insight and awareness. Intact Memory. Normal language and speech. Cranial nerve exam unremarkable.  Sensory exam is normal. Reflexes are 2+ in all 4's. Fine motor coordination is intact. No tremors. Motor function is grossly 5/5 RUE and RLE 5/5.  .  musculoskeletal:  RIGHT shoulder with pain free ROM. Left knee without effusion but significant crepitus with ROM and jt line pain. Antalgic on left also.               Assessment & Plan:  Medical Problem List and Plan: 1.  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 2.  Post- stroke HA:             -Continue Topamax 50 mg twice daily for headaches 3. HTN:  This is followed by cardiology and primary physician. Much improved 4.  Right rotator cuff tendinitis: HEP             -injection helpful              -continue HEP rom. 5. OA bilateral knees: continue Voltaren gel to knees             -supplements provided for OA provided once again             -some benefit with  synvisc injection             -consider zilretta injections if she decides to put off surgery  -knee replacement would be the definitive treatment for her pain and she's still young enough to enjoy.  If she ultimately decides to go this route I encouraged her not to put it off too long.     15 minutes of face to face patient care time were spent during this visit. All questions were encouraged and answered.  Follow up with me in 4 MOS .

## 2021-11-04 ENCOUNTER — Other Ambulatory Visit (HOSPITAL_COMMUNITY): Payer: Self-pay

## 2021-11-04 MED ORDER — DAPAGLIFLOZIN PROPANEDIOL 10 MG PO TABS
10.0000 mg | ORAL_TABLET | Freq: Every day | ORAL | 3 refills | Status: DC
  Filled 2021-11-04: qty 90, 90d supply, fill #0

## 2021-11-07 ENCOUNTER — Other Ambulatory Visit (HOSPITAL_COMMUNITY): Payer: Self-pay

## 2021-11-07 MED ORDER — DAPAGLIFLOZIN PROPANEDIOL 10 MG PO TABS
10.0000 mg | ORAL_TABLET | Freq: Every day | ORAL | 0 refills | Status: DC
  Filled 2021-11-07: qty 90, 90d supply, fill #0

## 2022-02-28 ENCOUNTER — Other Ambulatory Visit: Payer: Self-pay | Admitting: Adult Medicine

## 2022-02-28 DIAGNOSIS — Z1231 Encounter for screening mammogram for malignant neoplasm of breast: Secondary | ICD-10-CM

## 2022-03-17 ENCOUNTER — Telehealth: Payer: Self-pay

## 2022-03-17 NOTE — Telephone Encounter (Signed)
Patient called and asked if she can get the zilretta injection at her appointment on Wednesday. Spoke with Murlean Caller due to it having to be authorized by insurance. Informed patient that her appointment type will be changed in the system

## 2022-03-19 ENCOUNTER — Encounter: Payer: Medicare Other | Attending: Physical Medicine & Rehabilitation | Admitting: Physical Medicine & Rehabilitation

## 2022-03-19 ENCOUNTER — Encounter: Payer: Self-pay | Admitting: Physical Medicine & Rehabilitation

## 2022-03-19 VITALS — BP 117/79 | HR 80 | Temp 98.0°F | Ht 67.0 in | Wt 241.0 lb

## 2022-03-19 DIAGNOSIS — M170 Bilateral primary osteoarthritis of knee: Secondary | ICD-10-CM | POA: Diagnosis present

## 2022-03-19 NOTE — Progress Notes (Signed)
PROCEDURE NOTE  DIAGNOSIS:    INTERVENTION:   ZILRETTA INJECTION    After informed consent and preparation of the skin with betadine and isopropyl alcohol, I injected '32MG'$  of zilretta dissolved into 5cc of diluent into the bilateral knees via anterolateral approach. Contents of syring were shaken vigorously and aspiration was performed prior to injection. The patient tolerated well, and no complications were encountered. Afterward the area was cleaned and dressed. Post- injection instructions were provided including ice  if swelling or pain should occur.    Meredith Staggers, MD, [facility] 03/19/2022

## 2022-03-19 NOTE — Patient Instructions (Signed)
PLEASE FEEL FREE TO CALL OUR OFFICE WITH ANY PROBLEMS OR QUESTIONS ([phone])      

## 2022-03-27 ENCOUNTER — Ambulatory Visit: Payer: Medicare Other

## 2022-04-11 ENCOUNTER — Ambulatory Visit
Admission: RE | Admit: 2022-04-11 | Discharge: 2022-04-11 | Disposition: A | Discharge status: Home / Self Care | Payer: Medicare Other | Source: Ambulatory Visit | Attending: Adult Medicine | Admitting: Adult Medicine

## 2022-04-11 DIAGNOSIS — Z1231 Encounter for screening mammogram for malignant neoplasm of breast: Secondary | ICD-10-CM

## 2022-05-26 ENCOUNTER — Other Ambulatory Visit (HOSPITAL_COMMUNITY): Payer: Self-pay

## 2022-05-26 MED ORDER — HYDROCODONE-ACETAMINOPHEN 10-325 MG PO TABS
1.0000 | ORAL_TABLET | Freq: Every day | ORAL | 0 refills | Status: DC | PRN
  Filled 2022-05-26 – 2022-05-29 (×2): qty 150, 30d supply, fill #0

## 2022-05-29 ENCOUNTER — Other Ambulatory Visit (HOSPITAL_COMMUNITY): Payer: Self-pay

## 2022-06-25 ENCOUNTER — Encounter: Payer: Medicare Other | Attending: Physical Medicine & Rehabilitation | Admitting: Physical Medicine & Rehabilitation

## 2022-06-25 DIAGNOSIS — M1712 Unilateral primary osteoarthritis, left knee: Secondary | ICD-10-CM | POA: Insufficient documentation

## 2022-06-25 DIAGNOSIS — M1711 Unilateral primary osteoarthritis, right knee: Secondary | ICD-10-CM | POA: Insufficient documentation

## 2022-07-02 ENCOUNTER — Encounter: Payer: Self-pay | Admitting: Physical Medicine & Rehabilitation

## 2022-07-02 ENCOUNTER — Encounter (HOSPITAL_BASED_OUTPATIENT_CLINIC_OR_DEPARTMENT_OTHER): Payer: Medicare Other | Admitting: Physical Medicine & Rehabilitation

## 2022-07-02 VITALS — BP 143/95 | HR 89 | Ht 67.0 in | Wt 252.0 lb

## 2022-07-02 DIAGNOSIS — M1712 Unilateral primary osteoarthritis, left knee: Secondary | ICD-10-CM | POA: Diagnosis present

## 2022-07-02 DIAGNOSIS — M1711 Unilateral primary osteoarthritis, right knee: Secondary | ICD-10-CM | POA: Diagnosis present

## 2022-07-02 MED ORDER — TRIAMCINOLONE ACETONIDE 32 MG IX SRER
64.0000 mg | Freq: Once | INTRA_ARTICULAR | Status: AC
  Administered 2022-07-02: 64 mg via INTRA_ARTICULAR

## 2022-07-02 NOTE — Patient Instructions (Signed)
ALWAYS FEEL FREE TO CALL OUR OFFICE WITH ANY PROBLEMS OR QUESTIONS ([phone])  **PLEASE NOTE** ALL MEDICATION REFILL REQUESTS (INCLUDING CONTROLLED SUBSTANCES) NEED TO BE MADE AT LEAST 7 DAYS PRIOR TO REFILL BEING DUE. ANY REFILL REQUESTS INSIDE THAT TIME FRAME MAY RESULT IN DELAYS IN RECEIVING YOUR PRESCRIPTION.                    

## 2022-07-02 NOTE — Progress Notes (Signed)
PROCEDURE NOTE  DIAGNOSIS:  OA bilateral knees  INTERVENTION:   ZILRETTA INJECTION     After informed consent and preparation of the skin with betadine and isopropyl alcohol, I injected '32MG'$  of zilretta dissolved into 5cc of diluent into bilateral knees2 via anterolateral approach. Contents of syring were shaken vigorously and aspiration was performed prior to injection. The patient tolerated well, and no complications were encountered. Afterward the area was cleaned and dressed. Post- injection instructions were provided including ice  if swelling or pain should occur.    Meredith Staggers, MD, [facility] 07/02/2022

## 2022-10-22 ENCOUNTER — Encounter: Payer: Medicare HMO | Attending: Physical Medicine & Rehabilitation | Admitting: Physical Medicine & Rehabilitation

## 2022-10-22 ENCOUNTER — Ambulatory Visit
Admission: RE | Admit: 2022-10-22 | Discharge: 2022-10-22 | Disposition: A | Discharge status: Home / Self Care | Payer: Medicaid Other | Source: Ambulatory Visit | Attending: Physical Medicine & Rehabilitation | Admitting: Physical Medicine & Rehabilitation

## 2022-10-22 ENCOUNTER — Encounter: Payer: Self-pay | Admitting: Physical Medicine & Rehabilitation

## 2022-10-22 VITALS — BP 120/83 | HR 85 | Ht 67.0 in | Wt 234.0 lb

## 2022-10-22 DIAGNOSIS — M1712 Unilateral primary osteoarthritis, left knee: Secondary | ICD-10-CM

## 2022-10-22 MED ORDER — LIDOCAINE HCL 1 % IJ SOLN
5.0000 mL | Freq: Once | INTRAMUSCULAR | Status: AC
  Administered 2022-10-22: 5 mL

## 2022-10-22 MED ORDER — BETAMETHASONE SOD PHOS & ACET 6 (3-3) MG/ML IJ SUSP
12.0000 mg | Freq: Once | INTRAMUSCULAR | Status: AC
  Administered 2022-10-22: 12 mg via INTRA_ARTICULAR

## 2022-10-22 NOTE — Patient Instructions (Signed)
ALWAYS FEEL FREE TO CALL OUR OFFICE WITH ANY PROBLEMS OR QUESTIONS ([phone])  **PLEASE NOTE** ALL MEDICATION REFILL REQUESTS (INCLUDING CONTROLLED SUBSTANCES) NEED TO BE MADE AT LEAST 7 DAYS PRIOR TO REFILL BEING DUE. ANY REFILL REQUESTS INSIDE THAT TIME FRAME MAY RESULT IN DELAYS IN RECEIVING YOUR PRESCRIPTION.                    

## 2022-10-22 NOTE — Progress Notes (Signed)
Subjective:    Patient ID: Alison Whitehead, female    DOB: 09-18-1950, 72 y.o.   MRN: [id number]  HPI  Alison Whitehead is here in follow up of her chronic gait issues. She is having worsening left knee pain. She has not had long term relief with zilretta.  She is using RW to offload.   She has seen ortho in past who has mentioned TKA.    Pain Inventory Average Pain 10 Pain Right Now 10 My pain is constant, sharp, stabbing, and aching  In the last 24 hours, has pain interfered with the following? General activity 8 Relation with others 5 Enjoyment of life 5 What TIME of day is your pain at its worst? morning , daytime, evening, and night Sleep (in general) Poor  Pain is worse with: walking and bending Pain improves with: rest and medication Relief from Meds:  unknown  Family History  Problem Relation Age of Onset   Breast cancer Maternal Aunt    Diabetes Mother    Hypertension Mother    Glaucoma Mother    Other Father        accident   Diabetes Sister    Hypertension Sister    Cancer Sister    Colon cancer Neg Hx    Colon polyps Neg Hx    Esophageal cancer Neg Hx    Stomach cancer Neg Hx    Rectal cancer Neg Hx    Social History   Socioeconomic History   Marital status: Widowed    Spouse name: Not on file   Number of children: 2   Years of education: Not on file   Highest education level: Some college, no degree  Occupational History    Comment: retired  Tobacco Use   Smoking status: Former    Types: Cigarettes    Quit date: 10/13/2019    Years since quitting: 3.0   Smokeless tobacco: Never  Vaping Use   Vaping Use: Never used  Substance and Sexual Activity   Alcohol use: Never   Drug use: Never   Sexual activity: Not Currently    Birth control/protection: None  Other Topics Concern   Not on file  Social History Narrative   12/13/19 Lives alone   No caffeine   Social Determinants of Radio broadcast assistant Strain: Not on file  Food  Insecurity: Not on file  Transportation Needs: Not on file  Physical Activity: Not on file  Stress: Not on file  Social Connections: Not on file   Past Surgical History:  Procedure Laterality Date   SHOULDER SURGERY Left    lipoma   Past Surgical History:  Procedure Laterality Date   SHOULDER SURGERY Left    lipoma   Past Medical History:  Diagnosis Date   Allergy    seasonal   Arthritis    DDD (degenerative disc disease)    Degenerative disc disease, lumbar    Hypertension    IBS (irritable bowel syndrome)    Osteopenia    Osteoporosis    osteopenia   Stroke ([place]) 09/2019   There were no vitals taken for this visit.  Opioid Risk Score:   Fall Risk Score:  `1  Depression screen [facility] 2/9     07/02/2022   12:43 PM 03/19/2022    9:45 AM 09/18/2021   11:20 AM 03/20/2021    1:23 PM 12/19/2020   12:59 PM 10/17/2020    1:12 PM 04/18/2020    3:48 PM  Depression screen PHQ  2/9  Decreased Interest 0 0 0 0 0 0 0  Down, Depressed, Hopeless 0 0 0 0 0 0 0  PHQ - 2 Score 0 0 0 0 0 0 0    Review of Systems  Musculoskeletal:  Positive for gait problem.       Pain in both knees  All other systems reviewed and are negative.      Objective:   Physical Exam    General: No acute distress HEENT: NCAT, EOMI, oral membranes moist Cards: reg rate  Chest: normal effort Abdomen: Soft, NT, ND Skin: dry, intact Extremities: no edema Psych: pleasant and appropriate  Neuro: Alert and oriented x 3. Normal insight and awareness. Intact Memory. Normal language and speech. Cranial nerve exam unremarkable.  Sensory exam is normal. Reflexes are 2+ in all 4's. Fine motor coordination is intact. No tremors. Motor function is grossly 5/5 RUE and RLE 5/5.  .  musculoskeletal:  normal right shouldr rom. Left knee with jt line pain and minimal crepitus and swelling today. RW to offload              Assessment & Plan:  Medical Problem List and Plan: 1.  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 2.  Post- stroke HA:             -Continue Topamax 50 mg twice daily for headaches 3. HTN:  This is followed by cardiology and primary physician. Much improved 4.  Right rotator cuff tendinitis: HEP             -scapular rom. 5. OA bilateral knees: worsening symptoms  -probably needs TKA             -supplements provided for OA provided             -left knee xrays ordered              After informed consent and preparation of the skin with betadine and isopropyl alcohol, I injected 6mg  (1cc) of celestone and 4cc of 1% lidocaine into the left knee via anterolateral approach. Additionally, aspiration was performed prior to injection. The patient tolerated well, and no complications were encountered. Afterward the area was cleaned and dressed. Post- injection instructions were provided.       Fifteen minutes of face to face patient care time were spent during this visit. All questions were encouraged and answered.  Follow up with me in 4 MOS .

## 2022-10-23 ENCOUNTER — Ambulatory Visit: Payer: Medicaid Other | Admitting: Physician Assistant

## 2022-10-27 ENCOUNTER — Telehealth: Payer: Self-pay | Admitting: *Deleted

## 2022-10-27 NOTE — Telephone Encounter (Signed)
Call report for LT knee imaging on Dr. Naaman Plummer patient. Dr. Naaman Plummer is out of office please review.

## 2022-10-28 NOTE — Telephone Encounter (Signed)
Attempted to call x2 no vmail.

## 2022-10-29 NOTE — Telephone Encounter (Signed)
Attempted to contact patient again today without success.

## 2022-11-03 NOTE — Telephone Encounter (Signed)
3rd attempt to call patient.She does not have a voicemail. Left message on daughter's # to have patient give office a call.

## 2022-11-04 ENCOUNTER — Ambulatory Visit (INDEPENDENT_AMBULATORY_CARE_PROVIDER_SITE_OTHER): Payer: Medicare HMO | Admitting: Orthopedic Surgery

## 2022-11-04 ENCOUNTER — Telehealth (HOSPITAL_COMMUNITY): Payer: Self-pay | Admitting: Physical Medicine & Rehabilitation

## 2022-11-04 DIAGNOSIS — M1712 Unilateral primary osteoarthritis, left knee: Secondary | ICD-10-CM

## 2022-11-04 NOTE — Telephone Encounter (Signed)
Just an FYI. I've tried to call her several times about her knee xrays. I keep getting "your call cannot be completed message." I don't see any other numbers to try.   If anyone can find another contact number for her, that would be great!

## 2022-11-05 NOTE — Telephone Encounter (Signed)
Message closed due to numerous attempts to contact patient and family.

## 2022-11-07 ENCOUNTER — Telehealth: Payer: Self-pay | Admitting: Orthopedic Surgery

## 2022-11-07 NOTE — Telephone Encounter (Signed)
Patient stated she is ready for knee surgery please advise patient will be ready after 04/18 to talk surgery

## 2022-11-10 ENCOUNTER — Encounter: Payer: Self-pay | Admitting: Orthopedic Surgery

## 2022-11-10 NOTE — Telephone Encounter (Signed)
Last here on 11/04/22, left knee OA. OV note states she will call when ready to proceed with surgery. Has a blue sheet been filled out?

## 2022-11-10 NOTE — Progress Notes (Signed)
Office Visit Note   Patient: Alison Whitehead           Date of Birth: 03-29-1951           MRN: [id number] Visit Date: 11/04/2022              Requested by: Burnice Logan, PA [address] [place],  [place] [address] PCP: Burnice Logan, PA  Chief Complaint  Patient presents with   Left Knee - Pain      HPI: Patient is a 72 year old woman with osteoarthritis of her left knee she describes global pain ambulates with a walker states she has had steroid injections without relief.  Assessment & Plan: Visit Diagnoses:  1. Unilateral primary osteoarthritis, left knee     Plan: Discussed treatment options with a total knee arthroplasty risk and benefits were discussed.  Patient states she would not have care at home anticipate she would need discharge to skilled nursing.  Patient states she will call if she wants to proceed with surgery.  Follow-Up Instructions: Return if symptoms worsen or fail to improve.   Ortho Exam  Patient is alert, oriented, no adenopathy, well-dressed, normal affect, normal respiratory effort. Examination patient has difficulty getting from a sit to a stand position.  She has crepitation range of motion of the left knee collaterals and cruciates are stable.  Radiograph shows bone-on-bone contact with tricompartmental arthritis.  Imaging: No results found. No images are attached to the encounter.  Labs: Lab Results  Component Value Date   HGBA1C 6.2 (H) 10/02/2019   ESRSEDRATE 4 11/08/2007     Lab Results  Component Value Date   ALBUMIN 3.4 (L) 10/07/2019   ALBUMIN 3.5 10/01/2019   ALBUMIN 3.7 09/30/2019    No results found for: "MG" No results found for: "VD25OH"  No results found for: "PREALBUMIN"    Latest Ref Rng & Units 05/15/2020   12:26 PM 10/24/2019    6:06 AM 10/18/2019    6:38 AM  CBC EXTENDED  WBC 4.0 - 10.5 K/uL 4.9  5.4  4.8   RBC 3.87 - 5.11 MIL/uL 4.02  4.00  4.03   Hemoglobin 12.0 - 15.0 g/dL 62.9   52.8  41.3   HCT 36.0 - 46.0 % 37.9  35.3  35.3   Platelets 150 - 400 K/uL 221  202  200      There is no height or weight on file to calculate BMI.  Orders:  No orders of the defined types were placed in this encounter.  No orders of the defined types were placed in this encounter.    Procedures: No procedures performed  Clinical Data: No additional findings.  ROS:  All other systems negative, except as noted in the HPI. Review of Systems  Objective: Vital Signs: There were no vitals taken for this visit.  Specialty Comments:  No specialty comments available.  PMFS History: Patient Active Problem List   Diagnosis Date Noted   Rotator cuff syndrome, right 01/11/2020   Labile blood glucose    Vascular headache    Small vessel disease, cerebrovascular    Prediabetes    AKI (acute kidney injury)    Right pontine stroke 10/06/2019   Acute cerebrovascular accident (CVA) 09/30/2019   Unilateral primary osteoarthritis, left knee 03/17/2019   Unilateral primary osteoarthritis, right knee 03/17/2019   Bilateral primary osteoarthritis of knee 03/10/2019   INSOMNIA, CHRONIC 05/19/2007   SPONDYLOSIS, LUMBAR 05/19/2007   Essential hypertension 05/03/2007   ARTHRITIS,  KNEE 05/03/2007   Past Medical History:  Diagnosis Date   Allergy    seasonal   Arthritis    DDD (degenerative disc disease)    Degenerative disc disease, lumbar    Hypertension    IBS (irritable bowel syndrome)    Osteopenia    Osteoporosis    osteopenia   Stroke 09/2019    Family History  Problem Relation Age of Onset   Breast cancer Maternal Aunt    Diabetes Mother    Hypertension Mother    Glaucoma Mother    Other Father        accident   Diabetes Sister    Hypertension Sister    Cancer Sister    Colon cancer Neg Hx    Colon polyps Neg Hx    Esophageal cancer Neg Hx    Stomach cancer Neg Hx    Rectal cancer Neg Hx     Past Surgical History:  Procedure Laterality Date   SHOULDER  SURGERY Left    lipoma   Social History   Occupational History    Comment: retired  Tobacco Use   Smoking status: Former    Types: Cigarettes    Quit date: 10/13/2019    Years since quitting: 3.0   Smokeless tobacco: Never  Vaping Use   Vaping Use: Never used  Substance and Sexual Activity   Alcohol use: Never   Drug use: Never   Sexual activity: Not Currently    Birth control/protection: None

## 2022-11-18 ENCOUNTER — Telehealth: Payer: Self-pay | Admitting: Orthopedic Surgery

## 2022-11-18 NOTE — Telephone Encounter (Signed)
Pt had called on 11/07/22 asking about surgery and that she would like to proceed. The message has been sent to [facility] and she is in process of setting up this elective surgery at [facility]. Patient will need to be d/c to SNF after surgery as she stated she won't have any help at home afterwards. I will call pt to advise her of this.

## 2022-11-18 NOTE — Telephone Encounter (Signed)
Patient asking when she will be having surgery she has an appointment on Thursday.. patient is asking to speak to a technician she has some questions.

## 2022-11-18 NOTE — Telephone Encounter (Signed)
I SW Alison Whitehead, she says that she is working with Dr. Audrie Lia elective surgeries this afternoon and will be giving her a call today.

## 2022-12-17 ENCOUNTER — Encounter: Payer: Medicare HMO | Admitting: Physical Medicine & Rehabilitation

## 2022-12-17 ENCOUNTER — Inpatient Hospital Stay (HOSPITAL_COMMUNITY): Admission: RE | Admit: 2022-12-17 | Payer: Medicare HMO | Source: Ambulatory Visit

## 2022-12-19 NOTE — Pre-Procedure Instructions (Signed)
Surgical Instructions    Your procedure is scheduled on Friday, May 24th.  Report to [facility] Main Entrance "A" at 07:15 A.M., then check in with the Admitting office.  Call this number if you have problems the morning of surgery:  [phone]  If you have any questions prior to your surgery date call [phone]: Open Monday-Friday 8am-4pm If you experience any cold or flu symptoms such as cough, fever, chills, shortness of breath, etc. between now and your scheduled surgery, please notify us at the above number.     Remember:  Do not eat after midnight the night before your surgery  You may drink clear liquids until 06:15 AM the morning of your surgery.   Clear liquids allowed are: Water, Non-Citrus Juices (without pulp), Carbonated Beverages, Clear Tea, Black Coffee Only (NO MILK, CREAM OR POWDERED CREAMER of any kind), and Gatorade.    Take these medicines the morning of surgery with A SIP OF WATER  amLODipine (NORVASC)  amoxicillin-clavulanate (AUGMENTIN)  atorvastatin (LIPITOR)  azithromycin (ZITHROMAX)  carvedilol (COREG)  gabapentin (NEURONTIN)  metoprolol succinate (TOPROL-XL)  predniSONE (DELTASONE)  topiramate (TOPAMAX)  verapamil (CALAN-SR)    If needed: acetaminophen (TYLENOL)  albuterol (PROAIR HFA)- bring inhaler with you on day of surgery dicyclomine (BENTYL)  HYDROcodone-acetaminophen (NORCO)  naphazoline-glycerin (CLEAR EYES REDNESS)   HOLD phentermine AND Diethylpropion HCl CR for 2 WEEKS prior to surgery.  Follow your surgeon's instructions on when to stop Aspirin.  If no instructions were given by your surgeon then you will need to call the office to get those instructions.     As of today, STOP taking any Aleve, Naproxen, Ibuprofen, Motrin, Advil, Goody's, BC's, all herbal medications, fish oil, diclofenac sodium (VOLTAREN) gel and all vitamins.  WHAT DO I DO ABOUT MY DIABETES MEDICATION?   Do not take metFORMIN (GLUCOPHAGE) the morning of  surgery.  HOLD dapagliflozin propanediol (FARXIGA) for 72 hours prior to surgery. Last dose 5/20.    HOLD Semaglutide (RYBELSUS) for 7 days prior to surgery.     HOW TO MANAGE YOUR DIABETES BEFORE AND AFTER SURGERY  Why is it important to control my blood sugar before and after surgery? Improving blood sugar levels before and after surgery helps healing and can limit problems. A way of improving blood sugar control is eating a healthy diet by:  Eating less sugar and carbohydrates  Increasing activity/exercise  Talking with your doctor about reaching your blood sugar goals High blood sugars (greater than 180 mg/dL) can raise your risk of infections and slow your recovery, so you will need to focus on controlling your diabetes during the weeks before surgery. Make sure that the doctor who takes care of your diabetes knows about your planned surgery including the date and location.  How do I manage my blood sugar before surgery? Check your blood sugar at least 4 times a day, starting 2 days before surgery, to make sure that the level is not too high or low.  Check your blood sugar the morning of your surgery when you wake up and every 2 hours until you get to the Short Stay unit.  If your blood sugar is less than 70 mg/dL, you will need to treat for low blood sugar: Do not take insulin. Treat a low blood sugar (less than 70 mg/dL) with  cup of clear juice (cranberry or apple), 4 glucose tablets, OR glucose gel. Recheck blood sugar in 15 minutes after treatment (to make sure it is greater than 70 mg/dL).  If your blood sugar is not greater than 70 mg/dL on recheck, call [phone] for further instructions. Report your blood sugar to the short stay nurse when you get to Short Stay.  If you are admitted to the hospital after surgery: Your blood sugar will be checked by the staff and you will probably be given insulin after surgery (instead of oral diabetes medicines) to make sure you have  good blood sugar levels. The goal for blood sugar control after surgery is 80-180 mg/dL.                     Do NOT Smoke (Tobacco/Vaping) for 24 hours prior to your procedure.  If you use a CPAP at night, you may bring your mask/headgear for your overnight stay.   Contacts, glasses, piercing's, hearing aid's, dentures or partials may not be worn into surgery, please bring cases for these belongings.    For patients admitted to the hospital, discharge time will be determined by your treatment team.   Patients discharged the day of surgery will not be allowed to drive home, and someone needs to stay with them for 24 hours.  SURGICAL WAITING ROOM VISITATION Patients having surgery or a procedure may have no more than 2 support people in the waiting area - these visitors may rotate.   Children under the age of 48 must have an adult with them who is not the patient. If the patient needs to stay at the hospital during part of their recovery, the visitor guidelines for inpatient rooms apply. Pre-op nurse will coordinate an appropriate time for 1 support person to accompany patient in pre-op.  This support person may not rotate.   Please refer to the [facility] website for the visitor guidelines for Inpatients (after your surgery is over and you are in a regular room).      Pre-operative 5 CHG Bath Instructions   You can play a key role in reducing the risk of infection after surgery. Your skin needs to be as free of germs as possible. You can reduce the number of germs on your skin by washing with CHG (chlorhexidine gluconate) soap before surgery. CHG is an antiseptic soap that kills germs and continues to kill germs even after washing.   DO NOT use if you have an allergy to chlorhexidine/CHG or antibacterial soaps. If your skin becomes reddened or irritated, stop using the CHG and notify one of our RNs at [phone].   Please shower with the CHG soap starting 4 days before surgery using  the following schedule:     Please keep in mind the following:  DO NOT shave, including legs and underarms, starting the day of your first shower.   You may shave your face at any point before/day of surgery.  Place clean sheets on your bed the day you start using CHG soap. Use a clean washcloth (not used since being washed) for each shower. DO NOT sleep with pets once you start using the CHG.   CHG Shower Instructions:  If you choose to wash your hair and private area, wash first with your normal shampoo/soap.  After you use shampoo/soap, rinse your hair and body thoroughly to remove shampoo/soap residue.  Turn the water OFF and apply about 3 tablespoons (45 ml) of CHG soap to a CLEAN washcloth.  Apply CHG soap ONLY FROM YOUR NECK DOWN TO YOUR TOES (washing for 3-5 minutes)  DO NOT use CHG soap on face, private areas, open wounds, or  sores.  Pay special attention to the area where your surgery is being performed.  If you are having back surgery, having someone wash your back for you may be helpful. Wait 2 minutes after CHG soap is applied, then you may rinse off the CHG soap.  Pat dry with a clean towel  Put on clean clothes/pajamas   If you choose to wear lotion, please use ONLY the CHG-compatible lotions on the back of this paper.     Additional instructions for the day of surgery: DO NOT APPLY any lotions, deodorants, cologne, or perfumes.   Put on clean/comfortable clothes.  Brush your teeth.  Ask your nurse before applying any prescription medications to the skin. Do not wear jewelry or makeup Do not bring valuables to the hospital. [facility] is not responsible for any belongings or valuables. Do not wear nail polish, gel polish, artificial nails, or any other type of covering on natural nails (fingers and toes) If you have artificial nails or gel coating that need to be removed by a nail salon, please have this removed prior to surgery. Artificial nails or gel coating may  interfere with anesthesia's ability to adequately monitor your vital signs.     CHG Compatible Lotions   Aveeno Moisturizing lotion  Cetaphil Moisturizing Cream  Cetaphil Moisturizing Lotion  Clairol Herbal Essence Moisturizing Lotion, Dry Skin  Clairol Herbal Essence Moisturizing Lotion, Extra Dry Skin  Clairol Herbal Essence Moisturizing Lotion, Normal Skin  Curel Age Defying Therapeutic Moisturizing Lotion with Alpha Hydroxy  Curel Extreme Care Body Lotion  Curel Soothing Hands Moisturizing Hand Lotion  Curel Therapeutic Moisturizing Cream, Fragrance-Free  Curel Therapeutic Moisturizing Lotion, Fragrance-Free  Curel Therapeutic Moisturizing Lotion, Original Formula  Eucerin Daily Replenishing Lotion  Eucerin Dry Skin Therapy Plus Alpha Hydroxy Crme  Eucerin Dry Skin Therapy Plus Alpha Hydroxy Lotion  Eucerin Original Crme  Eucerin Original Lotion  Eucerin Plus Crme Eucerin Plus Lotion  Eucerin TriLipid Replenishing Lotion  Keri Anti-Bacterial Hand Lotion  Keri Deep Conditioning Original Lotion Dry Skin Formula Softly Scented  Keri Deep Conditioning Original Lotion, Fragrance Free Sensitive Skin Formula  Keri Lotion Fast Absorbing Fragrance Free Sensitive Skin Formula  Keri Lotion Fast Absorbing Softly Scented Dry Skin Formula  Keri Original Lotion  Keri Skin Renewal Lotion Keri Silky Smooth Lotion  Keri Silky Smooth Sensitive Skin Lotion  Nivea Body Creamy Conditioning Oil  Nivea Body Extra Enriched Lotion  Nivea Body Original Lotion  Nivea Body Sheer Moisturizing Lotion Nivea Crme  Nivea Skin Firming Lotion  NutraDerm 30 Skin Lotion  NutraDerm Skin Lotion  NutraDerm Therapeutic Skin Cream  NutraDerm Therapeutic Skin Lotion  ProShield Protective Hand Cream  Provon moisturizing lotion        Please read over the following fact sheets that you were given.    If you received a COVID test during your pre-op visit  it is requested that you wear a mask when out  in public, stay away from anyone that may not be feeling well and notify your surgeon if you develop symptoms. If you have been in contact with anyone that has tested positive in the last 10 days please notify you surgeon.

## 2022-12-22 ENCOUNTER — Encounter (HOSPITAL_COMMUNITY): Payer: Self-pay

## 2022-12-22 ENCOUNTER — Encounter (HOSPITAL_COMMUNITY)
Admission: RE | Admit: 2022-12-22 | Discharge: 2022-12-22 | Disposition: A | Discharge status: Home / Self Care | Payer: Medicare HMO | Source: Ambulatory Visit | Attending: Orthopedic Surgery | Admitting: Orthopedic Surgery

## 2022-12-22 ENCOUNTER — Telehealth: Payer: Self-pay | Admitting: Orthopedic Surgery

## 2022-12-22 ENCOUNTER — Other Ambulatory Visit: Payer: Self-pay

## 2022-12-22 VITALS — BP 106/79 | HR 78 | Temp 97.7°F | Resp 18 | Ht 66.0 in | Wt 231.0 lb

## 2022-12-22 DIAGNOSIS — K589 Irritable bowel syndrome without diarrhea: Secondary | ICD-10-CM | POA: Insufficient documentation

## 2022-12-22 DIAGNOSIS — Z01818 Encounter for other preprocedural examination: Secondary | ICD-10-CM | POA: Diagnosis present

## 2022-12-22 DIAGNOSIS — N189 Chronic kidney disease, unspecified: Secondary | ICD-10-CM | POA: Insufficient documentation

## 2022-12-22 DIAGNOSIS — I10 Essential (primary) hypertension: Secondary | ICD-10-CM

## 2022-12-22 DIAGNOSIS — I129 Hypertensive chronic kidney disease with stage 1 through stage 4 chronic kidney disease, or unspecified chronic kidney disease: Secondary | ICD-10-CM | POA: Insufficient documentation

## 2022-12-22 DIAGNOSIS — R9431 Abnormal electrocardiogram [ECG] [EKG]: Secondary | ICD-10-CM | POA: Insufficient documentation

## 2022-12-22 DIAGNOSIS — M1712 Unilateral primary osteoarthritis, left knee: Secondary | ICD-10-CM | POA: Insufficient documentation

## 2022-12-22 DIAGNOSIS — Z8673 Personal history of transient ischemic attack (TIA), and cerebral infarction without residual deficits: Secondary | ICD-10-CM | POA: Diagnosis not present

## 2022-12-22 DIAGNOSIS — R7303 Prediabetes: Secondary | ICD-10-CM | POA: Diagnosis not present

## 2022-12-22 DIAGNOSIS — Z87891 Personal history of nicotine dependence: Secondary | ICD-10-CM | POA: Insufficient documentation

## 2022-12-22 DIAGNOSIS — I629 Nontraumatic intracranial hemorrhage, unspecified: Secondary | ICD-10-CM | POA: Insufficient documentation

## 2022-12-22 HISTORY — DX: Chronic kidney disease, unspecified: N18.9

## 2022-12-22 LAB — BASIC METABOLIC PANEL
Anion gap: 11 (ref 5–15)
BUN: 27 mg/dL — ABNORMAL HIGH (ref 8–23)
CO2: 25 mmol/L (ref 22–32)
Calcium: 9.1 mg/dL (ref 8.9–10.3)
Chloride: 102 mmol/L (ref 98–111)
Creatinine, Ser: 1.78 mg/dL — ABNORMAL HIGH (ref 0.44–1.00)
GFR, Estimated: 30 mL/min — ABNORMAL LOW (ref >60)
Glucose, Bld: 117 mg/dL — ABNORMAL HIGH (ref 70–99)
Potassium: 4.3 mmol/L (ref 3.5–5.1)
Sodium: 138 mmol/L (ref 135–145)

## 2022-12-22 LAB — CBC
HCT: 40.5 % (ref 36.0–46.0)
Hemoglobin: 12.1 g/dL (ref 12.0–15.0)
MCH: 27.5 pg (ref 26.0–34.0)
MCHC: 29.9 g/dL — ABNORMAL LOW (ref 30.0–36.0)
MCV: 92 fL (ref 80.0–100.0)
Platelets: 261 10*3/uL (ref 150–400)
RBC: 4.4 MIL/uL (ref 3.87–5.11)
RDW: 13.9 % (ref 11.5–15.5)
WBC: 5.5 10*3/uL (ref 4.0–10.5)
nRBC: 0 % (ref 0.0–0.2)

## 2022-12-22 LAB — GLUCOSE, CAPILLARY: Glucose-Capillary: 121 mg/dL — ABNORMAL HIGH (ref 70–99)

## 2022-12-22 LAB — HEMOGLOBIN A1C
Hgb A1c MFr Bld: 6.1 % — ABNORMAL HIGH (ref 4.8–5.6)
Mean Plasma Glucose: 128.37 mg/dL

## 2022-12-22 LAB — SURGICAL PCR SCREEN
MRSA, PCR: POSITIVE — AB
Staphylococcus aureus: POSITIVE — AB

## 2022-12-22 NOTE — Progress Notes (Addendum)
PCP - Was seeing Mccune at [facility] now sees Dr. Allena Katz Cardiologist - Dr. Rosita Kea with [facility]   PPM/ICD - Denies  Chest x-ray - N/I EKG - 12/22/22 Stress Test - Not sure ECHO - 10/02/19 Cardiac Cath - Denies  Sleep Study - Yes no OSA  DM - II CBG at PAT appt 121 this was a fasting number Fasting Blood Sugar - Unaware Checks Blood Sugar __at doctor's office  Last dose of GLP1 agonist-  Unsure if she is still taking  GLP1 instructions: Needs to hold for 7 days  Blood Thinner Instructions:N/A Aspirin Instructions: Requested patient to call Dr. Audrie Lia office to ask about when to stop aspirin  ERAS Protcol -Yes  PRE-SURGERY G2- given   COVID TEST- N/A   Anesthesia review: Yes sees cardiology at [facility]  Patient denies shortness of breath, fever, cough and chest pain at PAT appointment   All instructions explained to the patient, with a verbal understanding of the material. Patient agrees to go over the instructions while at home for a better understanding. The opportunity to ask questions was provided.  Updated patient's medications to take day of surgery to Coreg and atorvastatin

## 2022-12-22 NOTE — Telephone Encounter (Signed)
Pt called requesting a call back. She states when should she stop taking her Asprin. Her surgery is this Friday. Al so stating the rehab she can go to after being discharge for after care. Please call pt at [phone].

## 2022-12-22 NOTE — Progress Notes (Signed)
Patient's surgical PCR was positive for staph and mrsa.  CAlled and left a message with the positive results with Dr. Audrie Lia office.

## 2022-12-22 NOTE — Telephone Encounter (Signed)
Advised can take BASA does not need to stop this prior to the knee replacement. Also the pt does not want a nickel component she wants titanium and I advised that I am not sure what the knee is made of but that I would relay this information to Dr. Lajoyce Corners so that he is aware. Per Dr. Lajoyce Corners it is made of a chromium cobalt which is more durable that the titanium. I called the pt to advise and she voiced understanding.

## 2022-12-23 ENCOUNTER — Encounter (HOSPITAL_COMMUNITY): Payer: Self-pay

## 2022-12-23 NOTE — Progress Notes (Signed)
Anesthesia Chart Review:  Case: [id number] Date/Time: 12/26/22 0903   Procedure: LEFT TOTAL KNEE ARTHROPLASTY (Left: Knee)   Anesthesia type: Choice   Pre-op diagnosis: Osteoarthritis Left Knee   Location: [facility] OR ROOM 06 / [facility] OR   Surgeons: Nadara Mustard, MD       DISCUSSION: Patient is a 72 year old female scheduled for the above procedure.  History includes former smoker (quit 10/13/19), HTN, IBS, CVA (acute/early subacute right pontine micro-hemorrhagic CVA & multiple chronic lacunar infarcts bilateral basal ganglia and thalami 09/30/19 MRI; UDS + cocaine), CKD, osteoarthritis.   Several medications on her medication list as documented as not taking including albuterol MDI, Zyrtec, Voltaran gel, Toprol XL 25 mg, prednisone taper, Benefiber, Vitamin D, lisinopril 20 mg, verapamil 240 mg, Topamax 50 mg, Senokot. Phentermine is listed as being on hold. She was unsure if she was still taking Rybelsus 7 or 14 mg by mouth daily. Meds listed as taking include Azor 10-40 mg daily, ASA 81 mg, Lipitor 40 mg daily, Coreg 25 mg BID, Creon, Farxiga 10 mg daily, Benadryl 25 mg daily, ferrous sulfate 325 mg TID, Norco 10-325 mg 5 times per day as needed, metformin 500 mg daily, torsemide 5 mg daily.   Last Marcelline Deist prior to surgery 12/22/22.  I left her a voice message to contact me to clarify Rybelsus instructions--it appears she was told to hold for 7 days if she was taking, but based on guidelines she would need to hold for a full 24 hours prior to surgery. I also wanted to clarify why she is followed by cardiology. Records from [facility] are still pending. PAT EKG felt stable showing SR, first degree AV block, non-specific T changes--primarily involving inferolateral leads and appears less prominent when compared to 05/15/20 tracing.   Her Creatinine is 1.78, eGFR 30 with PAT labs, previously Cr 1.65 with eGFR 32 on 05/15/20 by [facility] labs. Based on this, renal function appears stable but currently not  with very recent comparison labs. Labs were requested from [facility]. I also requested [facility] last office notes--she has been seen by Dr. Thedore Mins in the past for CKD stage 3.    A1c 6.1%.   Awaiting call back from patient and for requested records as above.   VS: BP 106/79   Pulse 78   Temp 36.5 C (Oral)   Resp 18   Ht 5\' 6"  (1.676 m)   Wt 104.8 kg   SpO2 98%   BMI 37.28 kg/m    PROVIDERS: Burnice Logan, PA was PCP at [facility]. She is now seeing "Dr. Allena Katz." Rosita Kea, MD is cardiologist [facility]) Anthony Sar, MD is nephrologist   LABS: Preoperative labs noted. See DISCUSSION. (all labs ordered are listed, but only abnormal results are displayed)  Labs Reviewed  SURGICAL PCR SCREEN - Abnormal; Notable for the following components:      Result Value   MRSA, PCR POSITIVE (*)    Staphylococcus aureus POSITIVE (*)    All other components within normal limits  GLUCOSE, CAPILLARY - Abnormal; Notable for the following components:   Glucose-Capillary 121 (*)    All other components within normal limits  CBC - Abnormal; Notable for the following components:   MCHC 29.9 (*)    All other components within normal limits  BASIC METABOLIC PANEL - Abnormal; Notable for the following components:   Glucose, Bld 117 (*)    BUN 27 (*)    Creatinine, Ser 1.78 (*)  GFR, Estimated 30 (*)    All other components within normal limits  HEMOGLOBIN A1C - Abnormal; Notable for the following components:   Hgb A1c MFr Bld 6.1 (*)    All other components within normal limits    IMAGES: Xray left knee 10/22/22: IMPRESSION: 1. Advanced degenerative changes of the medial and lateral compartments, favored mildly progressed. 2. There is a possible joint fat-fluid level versus summation artifact. This could reflect an occult insufficiency fracture. Recommend correlation with clinical symptomatology with consideration of dedicated  cross-sectional imaging as clinically indicated.   EKG: EKG 12/22/22: Sinus rhythm with 1st degree A-V block Nonspecific T wave abnormality Abnormal ECG When compared with ECG of 15-May-2020 12:21, PREVIOUS ECG IS PRESENT No significant change since last tracing Confirmed by York Pellant [phone]) on 12/22/2022 9:12:58 PM   CV: Echo 10/02/19: IMPRESSIONS   1. Left ventricular ejection fraction, by estimation, is 55 to 60%. The  left ventricle has normal function. The left ventricle has no regional  wall motion abnormalities. The left ventricular internal cavity size was  severely dilated. Left ventricular  diastolic parameters are consistent with Grade I diastolic dysfunction  (impaired relaxation). Elevated left ventricular end-diastolic pressure.   2. Right ventricular systolic function is normal. The right ventricular  size is normal. There is normal pulmonary artery systolic pressure.   3. The mitral valve is normal in structure and function. Trivial mitral  valve regurgitation. No evidence of mitral stenosis.   4. The aortic valve is tricuspid. Aortic valve regurgitation is trivial.  Mild to moderate aortic valve sclerosis/calcification is present, without  any evidence of aortic stenosis.   5. Aortic dilatation noted. 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   6. The inferior vena cava is normal in size with greater than 50%  respiratory variability, suggesting right atrial pressure of 3 mmHg.    US Carotid 10/02/19: Summary:  - Right Carotid: Velocities in the right ICA are consistent with a 1-39%  stenosis.  - Left Carotid: Velocities in the left ICA are consistent with a 1-39%  stenosis.  - Vertebrals: Bilateral vertebral arteries demonstrate antegrade flow.    Past Medical History:  Diagnosis Date   Allergy    seasonal   Arthritis    CKD (chronic kidney disease)    DDD (degenerative disc disease)    Degenerative disc disease,  lumbar    Hypertension    IBS (irritable bowel syndrome)    Osteopenia    Osteoporosis    osteopenia   Stroke (HCC) 09/2019    Past Surgical History:  Procedure Laterality Date   SHOULDER SURGERY Left    lipoma    MEDICATIONS:  albuterol (PROAIR HFA) 108 (90 Base) MCG/ACT inhaler   amLODipine-olmesartan (AZOR) 10-40 MG tablet   aspirin EC 81 MG tablet   atorvastatin (LIPITOR) 40 MG tablet   carvedilol (COREG) 25 MG tablet   carvedilol (COREG) 6.25 MG tablet   cetirizine (ZYRTEC) 10 MG tablet   COVID-19 mRNA Vac-TriS, Pfizer, SUSP injection   CREON 36000-114000 units CPEP capsule   dapagliflozin propanediol (FARXIGA) 10 MG TABS tablet   diclofenac sodium (VOLTAREN) 1 % GEL   dicyclomine (BENTYL) 10 MG capsule   diphenhydrAMINE (BENADRYL) 25 mg capsule   ferrous sulfate 325 (65 FE) MG tablet   HYDROcodone-acetaminophen (NORCO) 10-325 MG tablet   lisinopril (ZESTRIL) 20 MG tablet   Melatonin 5 MG CAPS   Menthol-Methyl Salicylate (MUSCLE RUB) 10-15 % CREA   metFORMIN (  GLUCOPHAGE) 500 MG tablet   metoprolol succinate (TOPROL-XL) 25 MG 24 hr tablet   naloxone (NARCAN) nasal spray 4 mg/0.1 mL   phentermine 30 MG capsule   polyethylene glycol (MIRALAX / GLYCOLAX) 17 g packet   predniSONE (DELTASONE) 5 MG tablet   RYBELSUS 14 MG TABS   Semaglutide (RYBELSUS) 7 MG TABS   senna-docusate (SENOKOT-S) 8.6-50 MG tablet   topiramate (TOPAMAX) 50 MG tablet   torsemide (DEMADEX) 5 MG tablet   verapamil (CALAN-SR) 240 MG CR tablet   Vitamin D, Ergocalciferol, (DRISDOL) 1.25 MG (50000 UNIT) CAPS capsule   Wheat Dextrin (BENEFIBER) POWD   No current facility-administered medications for this encounter.    Shonna Chock, PA-C Surgical Short Stay/Anesthesiology [facility] Phone [phone] [facility] Phone [phone] 12/23/2022 11:24 AM

## 2022-12-25 MED ORDER — TRANEXAMIC ACID 1000 MG/10ML IV SOLN
2000.0000 mg | INTRAVENOUS | Status: DC
  Filled 2022-12-25 (×2): qty 20

## 2022-12-25 NOTE — Anesthesia Preprocedure Evaluation (Addendum)
Anesthesia Evaluation  Patient identified by MRN, date of birth, ID band Patient awake    Reviewed: Allergy & Precautions, NPO status , Patient's Chart, lab work & pertinent test results  Airway Mallampati: II  TM Distance: >3 FB Neck ROM: Full    Dental  (+) Edentulous Upper, Edentulous Lower   Pulmonary neg pulmonary ROS, former smoker   Pulmonary exam normal        Cardiovascular hypertension, Pt. on medications and Pt. on home beta blockers  Rhythm:Regular Rate:Normal     Neuro/Psych  Headaches CVA, No Residual Symptoms  negative psych ROS   GI/Hepatic negative GI ROS, Neg liver ROS,,,  Endo/Other  diabetes, Type 2, Oral Hypoglycemic Agents    Renal/GU CRFRenal disease  negative genitourinary   Musculoskeletal  (+) Arthritis , Osteoarthritis,    Abdominal Normal abdominal exam  (+)   Peds  Hematology negative hematology ROS (+)   Anesthesia Other Findings   Reproductive/Obstetrics                             Anesthesia Physical Anesthesia Plan  ASA: 3  Anesthesia Plan: General and Regional   Post-op Pain Management: Regional block*   Induction: Intravenous  PONV Risk Score and Plan: 3 and Ondansetron, Dexamethasone and Treatment may vary due to age or medical condition  Airway Management Planned: Mask and LMA  Additional Equipment: None  Intra-op Plan:   Post-operative Plan: Extubation in OR  Informed Consent: I have reviewed the patients History and Physical, chart, labs and discussed the procedure including the risks, benefits and alternatives for the proposed anesthesia with the patient or authorized representative who has indicated his/her understanding and acceptance.     Dental advisory given  Plan Discussed with: CRNA  Anesthesia Plan Comments: (PAT note written 12/25/2022 by Shonna Chock, PA-C.  )       Anesthesia Quick Evaluation

## 2022-12-26 ENCOUNTER — Other Ambulatory Visit: Payer: Self-pay

## 2022-12-26 ENCOUNTER — Encounter (HOSPITAL_COMMUNITY)
Admission: RE | Disposition: A | Discharge status: Skilled Nursing Facility | Payer: Self-pay | Source: Home / Self Care | Attending: Orthopedic Surgery

## 2022-12-26 ENCOUNTER — Ambulatory Visit (HOSPITAL_COMMUNITY): Payer: Medicare HMO | Admitting: General Practice

## 2022-12-26 ENCOUNTER — Encounter (HOSPITAL_COMMUNITY): Payer: Self-pay | Admitting: Orthopedic Surgery

## 2022-12-26 ENCOUNTER — Inpatient Hospital Stay (HOSPITAL_COMMUNITY)
Admission: RE | Admit: 2022-12-26 | Discharge: 2022-12-31 | DRG: 470 | Disposition: A | Discharge status: Skilled Nursing Facility | Payer: Medicare HMO | Attending: Orthopedic Surgery | Admitting: Orthopedic Surgery

## 2022-12-26 ENCOUNTER — Ambulatory Visit (HOSPITAL_COMMUNITY): Payer: Medicare HMO | Admitting: Vascular Surgery

## 2022-12-26 DIAGNOSIS — Z8673 Personal history of transient ischemic attack (TIA), and cerebral infarction without residual deficits: Secondary | ICD-10-CM | POA: Diagnosis not present

## 2022-12-26 DIAGNOSIS — I10 Essential (primary) hypertension: Secondary | ICD-10-CM

## 2022-12-26 DIAGNOSIS — Z6837 Body mass index (BMI) 37.0-37.9, adult: Secondary | ICD-10-CM | POA: Diagnosis not present

## 2022-12-26 DIAGNOSIS — E1122 Type 2 diabetes mellitus with diabetic chronic kidney disease: Secondary | ICD-10-CM

## 2022-12-26 DIAGNOSIS — Z87891 Personal history of nicotine dependence: Secondary | ICD-10-CM | POA: Diagnosis not present

## 2022-12-26 DIAGNOSIS — Z7984 Long term (current) use of oral hypoglycemic drugs: Secondary | ICD-10-CM

## 2022-12-26 DIAGNOSIS — I129 Hypertensive chronic kidney disease with stage 1 through stage 4 chronic kidney disease, or unspecified chronic kidney disease: Secondary | ICD-10-CM

## 2022-12-26 DIAGNOSIS — Z833 Family history of diabetes mellitus: Secondary | ICD-10-CM | POA: Diagnosis not present

## 2022-12-26 DIAGNOSIS — G8929 Other chronic pain: Secondary | ICD-10-CM | POA: Diagnosis present

## 2022-12-26 DIAGNOSIS — Z83511 Family history of glaucoma: Secondary | ICD-10-CM | POA: Diagnosis not present

## 2022-12-26 DIAGNOSIS — M1712 Unilateral primary osteoarthritis, left knee: Secondary | ICD-10-CM

## 2022-12-26 DIAGNOSIS — Z803 Family history of malignant neoplasm of breast: Secondary | ICD-10-CM

## 2022-12-26 DIAGNOSIS — Z7985 Long-term (current) use of injectable non-insulin antidiabetic drugs: Secondary | ICD-10-CM

## 2022-12-26 DIAGNOSIS — Z8249 Family history of ischemic heart disease and other diseases of the circulatory system: Secondary | ICD-10-CM

## 2022-12-26 DIAGNOSIS — I130 Hypertensive heart and chronic kidney disease with heart failure and stage 1 through stage 4 chronic kidney disease, or unspecified chronic kidney disease: Secondary | ICD-10-CM | POA: Diagnosis present

## 2022-12-26 DIAGNOSIS — E669 Obesity, unspecified: Secondary | ICD-10-CM | POA: Diagnosis present

## 2022-12-26 DIAGNOSIS — Z880 Allergy status to penicillin: Secondary | ICD-10-CM

## 2022-12-26 DIAGNOSIS — Z7982 Long term (current) use of aspirin: Secondary | ICD-10-CM | POA: Diagnosis not present

## 2022-12-26 DIAGNOSIS — Z96652 Presence of left artificial knee joint: Principal | ICD-10-CM

## 2022-12-26 DIAGNOSIS — N189 Chronic kidney disease, unspecified: Secondary | ICD-10-CM | POA: Diagnosis not present

## 2022-12-26 DIAGNOSIS — Z79899 Other long term (current) drug therapy: Secondary | ICD-10-CM

## 2022-12-26 DIAGNOSIS — Z809 Family history of malignant neoplasm, unspecified: Secondary | ICD-10-CM

## 2022-12-26 HISTORY — PX: TOTAL KNEE ARTHROPLASTY: SHX125

## 2022-12-26 LAB — GLUCOSE, CAPILLARY
Glucose-Capillary: 96 mg/dL (ref 70–99)
Glucose-Capillary: 123 mg/dL — ABNORMAL HIGH (ref 70–99)

## 2022-12-26 SURGERY — ARTHROPLASTY, KNEE, TOTAL
Anesthesia: Regional | Site: Knee | Laterality: Left

## 2022-12-26 MED ORDER — SODIUM CHLORIDE 0.9 % IR SOLN
Status: DC | PRN
  Administered 2022-12-26: 3000 mL

## 2022-12-26 MED ORDER — FENTANYL CITRATE (PF) 250 MCG/5ML IJ SOLN
INTRAMUSCULAR | Status: AC
  Filled 2022-12-26: qty 5

## 2022-12-26 MED ORDER — TRANEXAMIC ACID-NACL 1000-0.7 MG/100ML-% IV SOLN
1000.0000 mg | INTRAVENOUS | Status: AC
  Administered 2022-12-26: 1000 mg via INTRAVENOUS
  Filled 2022-12-26: qty 100

## 2022-12-26 MED ORDER — BISACODYL 10 MG RE SUPP
10.0000 mg | Freq: Every day | RECTAL | Status: DC | PRN
  Administered 2022-12-30: 10 mg via RECTAL
  Filled 2022-12-26: qty 1

## 2022-12-26 MED ORDER — KETAMINE HCL 10 MG/ML IJ SOLN
INTRAMUSCULAR | Status: DC | PRN
  Administered 2022-12-26: 20 mg via INTRAVENOUS
  Administered 2022-12-26: 10 mg via INTRAVENOUS

## 2022-12-26 MED ORDER — DOCUSATE SODIUM 100 MG PO CAPS
100.0000 mg | ORAL_CAPSULE | Freq: Two times a day (BID) | ORAL | Status: DC
  Administered 2022-12-26 – 2022-12-31 (×10): 100 mg via ORAL
  Filled 2022-12-26 (×10): qty 1

## 2022-12-26 MED ORDER — MIDAZOLAM HCL 2 MG/2ML IJ SOLN: 1.0000 mg | Freq: Once | INTRAMUSCULAR | Status: AC

## 2022-12-26 MED ORDER — ONDANSETRON HCL 4 MG PO TABS: 4.0000 mg | ORAL_TABLET | Freq: Four times a day (QID) | ORAL | Status: DC | PRN

## 2022-12-26 MED ORDER — FENTANYL CITRATE (PF) 100 MCG/2ML IJ SOLN
INTRAMUSCULAR | Status: AC
  Filled 2022-12-26: qty 2

## 2022-12-26 MED ORDER — SODIUM CHLORIDE 0.9 % IV SOLN: INTRAVENOUS | Status: DC

## 2022-12-26 MED ORDER — CEFAZOLIN SODIUM-DEXTROSE 2-4 GM/100ML-% IV SOLN
2.0000 g | INTRAVENOUS | Status: AC
  Administered 2022-12-26: 2 g via INTRAVENOUS
  Filled 2022-12-26: qty 100

## 2022-12-26 MED ORDER — PHENOL 1.4 % MT LIQD: 1.0000 | OROMUCOSAL | Status: DC | PRN

## 2022-12-26 MED ORDER — METOCLOPRAMIDE HCL 5 MG/ML IJ SOLN: 5.0000 mg | Freq: Three times a day (TID) | INTRAMUSCULAR | Status: DC | PRN

## 2022-12-26 MED ORDER — ACETAMINOPHEN 325 MG PO TABS
325.0000 mg | ORAL_TABLET | Freq: Four times a day (QID) | ORAL | Status: DC | PRN
  Administered 2022-12-27: 650 mg via ORAL
  Filled 2022-12-26: qty 2

## 2022-12-26 MED ORDER — ATORVASTATIN CALCIUM 40 MG PO TABS
40.0000 mg | ORAL_TABLET | Freq: Every day | ORAL | Status: DC
  Administered 2022-12-27 – 2022-12-31 (×5): 40 mg via ORAL
  Filled 2022-12-26 (×5): qty 1

## 2022-12-26 MED ORDER — FENTANYL CITRATE (PF) 100 MCG/2ML IJ SOLN
INTRAMUSCULAR | Status: AC
  Administered 2022-12-26: 50 ug via INTRAVENOUS
  Filled 2022-12-26: qty 2

## 2022-12-26 MED ORDER — EPHEDRINE SULFATE-NACL 50-0.9 MG/10ML-% IV SOSY
PREFILLED_SYRINGE | INTRAVENOUS | Status: DC | PRN
  Administered 2022-12-26 (×2): 5 mg via INTRAVENOUS

## 2022-12-26 MED ORDER — OXYCODONE HCL 5 MG PO TABS: 5.0000 mg | ORAL_TABLET | ORAL | Status: DC | PRN

## 2022-12-26 MED ORDER — ACETAMINOPHEN 10 MG/ML IV SOLN
1000.0000 mg | Freq: Once | INTRAVENOUS | Status: DC | PRN
  Administered 2022-12-26: 1000 mg via INTRAVENOUS

## 2022-12-26 MED ORDER — ONDANSETRON HCL 4 MG/2ML IJ SOLN: 4.0000 mg | Freq: Four times a day (QID) | INTRAMUSCULAR | Status: DC | PRN

## 2022-12-26 MED ORDER — VANCOMYCIN HCL 1500 MG/300ML IV SOLN
1500.0000 mg | Freq: Two times a day (BID) | INTRAVENOUS | Status: AC
  Administered 2022-12-26: 1500 mg via INTRAVENOUS
  Filled 2022-12-26: qty 300

## 2022-12-26 MED ORDER — PROPOFOL 10 MG/ML IV BOLUS
INTRAVENOUS | Status: DC | PRN
  Administered 2022-12-26: 100 mg via INTRAVENOUS

## 2022-12-26 MED ORDER — ACETAMINOPHEN 10 MG/ML IV SOLN
INTRAVENOUS | Status: AC
  Filled 2022-12-26: qty 100

## 2022-12-26 MED ORDER — METOPROLOL SUCCINATE ER 25 MG PO TB24
25.0000 mg | ORAL_TABLET | Freq: Every day | ORAL | Status: DC
  Administered 2022-12-26 – 2022-12-27 (×2): 25 mg via ORAL
  Filled 2022-12-26 (×2): qty 1

## 2022-12-26 MED ORDER — VANCOMYCIN HCL IN DEXTROSE 1-5 GM/200ML-% IV SOLN: 1000.0000 mg | Freq: Two times a day (BID) | INTRAVENOUS | Status: DC

## 2022-12-26 MED ORDER — CHLORHEXIDINE GLUCONATE 0.12 % MT SOLN
15.0000 mL | Freq: Once | OROMUCOSAL | Status: AC
  Administered 2022-12-26: 15 mL via OROMUCOSAL
  Filled 2022-12-26: qty 15

## 2022-12-26 MED ORDER — AMLODIPINE BESYLATE 10 MG PO TABS
10.0000 mg | ORAL_TABLET | Freq: Every day | ORAL | Status: DC
  Administered 2022-12-26 – 2022-12-27 (×2): 10 mg via ORAL
  Filled 2022-12-26: qty 2
  Filled 2022-12-26: qty 1

## 2022-12-26 MED ORDER — VANCOMYCIN HCL IN DEXTROSE 1-5 GM/200ML-% IV SOLN
INTRAVENOUS | Status: AC
  Filled 2022-12-26: qty 200

## 2022-12-26 MED ORDER — METFORMIN HCL 500 MG PO TABS
500.0000 mg | ORAL_TABLET | Freq: Every day | ORAL | Status: DC
  Administered 2022-12-26 – 2022-12-31 (×6): 500 mg via ORAL
  Filled 2022-12-26 (×6): qty 1

## 2022-12-26 MED ORDER — IRBESARTAN 300 MG PO TABS
300.0000 mg | ORAL_TABLET | Freq: Every day | ORAL | Status: DC
  Administered 2022-12-26 – 2022-12-27 (×2): 300 mg via ORAL
  Filled 2022-12-26 (×2): qty 1

## 2022-12-26 MED ORDER — 0.9 % SODIUM CHLORIDE (POUR BTL) OPTIME
TOPICAL | Status: DC | PRN
  Administered 2022-12-26: 1000 mL

## 2022-12-26 MED ORDER — ZOLPIDEM TARTRATE 5 MG PO TABS
5.0000 mg | ORAL_TABLET | Freq: Every evening | ORAL | Status: DC | PRN
  Administered 2022-12-26 – 2022-12-27 (×2): 5 mg via ORAL
  Filled 2022-12-26 (×2): qty 1

## 2022-12-26 MED ORDER — ASPIRIN 325 MG PO TBEC
325.0000 mg | DELAYED_RELEASE_TABLET | Freq: Every day | ORAL | Status: DC
  Administered 2022-12-27 – 2022-12-31 (×5): 325 mg via ORAL
  Filled 2022-12-26 (×5): qty 1

## 2022-12-26 MED ORDER — LACTATED RINGERS IV SOLN: INTRAVENOUS | Status: DC

## 2022-12-26 MED ORDER — FENTANYL CITRATE (PF) 100 MCG/2ML IJ SOLN: 50.0000 ug | Freq: Once | INTRAMUSCULAR | Status: AC

## 2022-12-26 MED ORDER — HYDROMORPHONE HCL 1 MG/ML IJ SOLN
0.5000 mg | INTRAMUSCULAR | Status: DC | PRN
  Administered 2022-12-26 – 2022-12-31 (×10): 1 mg via INTRAVENOUS
  Filled 2022-12-26 (×12): qty 1

## 2022-12-26 MED ORDER — DAPAGLIFLOZIN PROPANEDIOL 10 MG PO TABS
10.0000 mg | ORAL_TABLET | Freq: Every day | ORAL | Status: DC
  Administered 2022-12-26 – 2022-12-31 (×6): 10 mg via ORAL
  Filled 2022-12-26 (×6): qty 1

## 2022-12-26 MED ORDER — MIDAZOLAM HCL 2 MG/2ML IJ SOLN
INTRAMUSCULAR | Status: AC
  Administered 2022-12-26: 1 mg via INTRAVENOUS
  Filled 2022-12-26: qty 2

## 2022-12-26 MED ORDER — ONDANSETRON HCL 4 MG/2ML IJ SOLN
INTRAMUSCULAR | Status: DC | PRN
  Administered 2022-12-26: 4 mg via INTRAVENOUS

## 2022-12-26 MED ORDER — METHOCARBAMOL 500 MG PO TABS
500.0000 mg | ORAL_TABLET | Freq: Four times a day (QID) | ORAL | Status: DC | PRN
  Administered 2022-12-26 – 2022-12-31 (×7): 500 mg via ORAL
  Filled 2022-12-26 (×7): qty 1

## 2022-12-26 MED ORDER — PHENYLEPHRINE 80 MCG/ML (10ML) SYRINGE FOR IV PUSH (FOR BLOOD PRESSURE SUPPORT)
PREFILLED_SYRINGE | INTRAVENOUS | Status: DC | PRN
  Administered 2022-12-26 (×2): 160 ug via INTRAVENOUS
  Administered 2022-12-26 (×4): 80 ug via INTRAVENOUS

## 2022-12-26 MED ORDER — FENTANYL CITRATE (PF) 100 MCG/2ML IJ SOLN
INTRAMUSCULAR | Status: DC | PRN
  Administered 2022-12-26 (×4): 25 ug via INTRAVENOUS
  Administered 2022-12-26: 50 ug via INTRAVENOUS

## 2022-12-26 MED ORDER — DEXAMETHASONE SODIUM PHOSPHATE 10 MG/ML IJ SOLN
INTRAMUSCULAR | Status: DC | PRN
  Administered 2022-12-26: 10 mg

## 2022-12-26 MED ORDER — PROPOFOL 500 MG/50ML IV EMUL
INTRAVENOUS | Status: DC | PRN
  Administered 2022-12-26: 35 ug/kg/min via INTRAVENOUS

## 2022-12-26 MED ORDER — TRANEXAMIC ACID 1000 MG/10ML IV SOLN
INTRAVENOUS | Status: DC | PRN
  Administered 2022-12-26: 2000 mg via TOPICAL

## 2022-12-26 MED ORDER — POLYETHYLENE GLYCOL 3350 17 G PO PACK
17.0000 g | PACK | Freq: Every day | ORAL | Status: DC | PRN
  Administered 2022-12-29 – 2022-12-30 (×2): 17 g via ORAL
  Filled 2022-12-26 (×2): qty 1

## 2022-12-26 MED ORDER — CARVEDILOL 25 MG PO TABS
25.0000 mg | ORAL_TABLET | Freq: Two times a day (BID) | ORAL | Status: DC
  Administered 2022-12-26 – 2022-12-31 (×11): 25 mg via ORAL
  Filled 2022-12-26 (×11): qty 1

## 2022-12-26 MED ORDER — ORAL CARE MOUTH RINSE: 15.0000 mL | Freq: Once | OROMUCOSAL | Status: AC

## 2022-12-26 MED ORDER — DIPHENHYDRAMINE HCL 50 MG/ML IJ SOLN
INTRAMUSCULAR | Status: DC | PRN
  Administered 2022-12-26: 6.25 mg via INTRAVENOUS

## 2022-12-26 MED ORDER — ALUM & MAG HYDROXIDE-SIMETH 200-200-20 MG/5ML PO SUSP: 30.0000 mL | ORAL | Status: DC | PRN

## 2022-12-26 MED ORDER — KETAMINE HCL 50 MG/5ML IJ SOSY
PREFILLED_SYRINGE | INTRAMUSCULAR | Status: AC
  Filled 2022-12-26: qty 5

## 2022-12-26 MED ORDER — LIDOCAINE 2% (20 MG/ML) 5 ML SYRINGE
INTRAMUSCULAR | Status: DC | PRN
  Administered 2022-12-26: 40 mg via INTRAVENOUS

## 2022-12-26 MED ORDER — FENTANYL CITRATE (PF) 100 MCG/2ML IJ SOLN
25.0000 ug | INTRAMUSCULAR | Status: DC | PRN
  Administered 2022-12-26: 50 ug via INTRAVENOUS
  Administered 2022-12-26 (×2): 25 ug via INTRAVENOUS
  Administered 2022-12-26: 50 ug via INTRAVENOUS

## 2022-12-26 MED ORDER — MENTHOL 3 MG MT LOZG: 1.0000 | LOZENGE | OROMUCOSAL | Status: DC | PRN

## 2022-12-26 MED ORDER — DEXMEDETOMIDINE HCL IN NACL 80 MCG/20ML IV SOLN
INTRAVENOUS | Status: DC | PRN
  Administered 2022-12-26: 4 ug via INTRAVENOUS
  Administered 2022-12-26: 8 ug via INTRAVENOUS

## 2022-12-26 MED ORDER — METOCLOPRAMIDE HCL 5 MG PO TABS: 5.0000 mg | ORAL_TABLET | Freq: Three times a day (TID) | ORAL | Status: DC | PRN

## 2022-12-26 MED ORDER — PANTOPRAZOLE SODIUM 40 MG PO TBEC
40.0000 mg | DELAYED_RELEASE_TABLET | Freq: Every day | ORAL | Status: DC
  Administered 2022-12-26 – 2022-12-31 (×6): 40 mg via ORAL
  Filled 2022-12-26 (×6): qty 1

## 2022-12-26 MED ORDER — AMLODIPINE-OLMESARTAN 10-40 MG PO TABS: 1.0000 | ORAL_TABLET | Freq: Every day | ORAL | Status: DC

## 2022-12-26 MED ORDER — DIPHENHYDRAMINE HCL 12.5 MG/5ML PO ELIX: 12.5000 mg | ORAL_SOLUTION | ORAL | Status: DC | PRN

## 2022-12-26 MED ORDER — METHOCARBAMOL 1000 MG/10ML IJ SOLN: 500.0000 mg | Freq: Four times a day (QID) | INTRAVENOUS | Status: DC | PRN

## 2022-12-26 MED ORDER — ROPIVACAINE HCL 7.5 MG/ML IJ SOLN
INTRAMUSCULAR | Status: DC | PRN
  Administered 2022-12-26: 20 mL via PERINEURAL

## 2022-12-26 MED ORDER — MAGNESIUM CITRATE PO SOLN: 1.0000 | Freq: Once | ORAL | Status: DC | PRN

## 2022-12-26 MED ORDER — OXYCODONE HCL 5 MG PO TABS
10.0000 mg | ORAL_TABLET | ORAL | Status: DC | PRN
  Administered 2022-12-26 – 2022-12-27 (×5): 15 mg via ORAL
  Filled 2022-12-26 (×5): qty 3

## 2022-12-26 SURGICAL SUPPLY — 60 items
BAG COUNTER SPONGE SURGICOUNT (BAG) ×1 IMPLANT
BAG SPNG CNTER NS LX DISP (BAG) ×1
BLADE SAGITTAL 25.0X1.19X90 (BLADE) ×1 IMPLANT
BLADE SAW SGTL 13X75X1.27 (BLADE) ×1 IMPLANT
BLADE SURG 21 STRL SS (BLADE) ×2 IMPLANT
BNDG CMPR 5X6 CHSV STRCH STRL (GAUZE/BANDAGES/DRESSINGS) ×1
BNDG COHESIVE 6X5 TAN ST LF (GAUZE/BANDAGES/DRESSINGS) ×2 IMPLANT
BNDG GAUZE DERMACEA FLUFF 4 (GAUZE/BANDAGES/DRESSINGS) ×1 IMPLANT
BNDG GZE DERMACEA 4 6PLY (GAUZE/BANDAGES/DRESSINGS) ×1
BOWL SMART MIX CTS (DISPOSABLE) ×1 IMPLANT
BSPLAT TIB 5D F CMNT KN LT (Knees) ×1 IMPLANT
CEMENT BONE R 1X40 (Cement) ×2 IMPLANT
COOLER ICEMAN CLASSIC (MISCELLANEOUS) ×1 IMPLANT
COVER SURGICAL LIGHT HANDLE (MISCELLANEOUS) ×1 IMPLANT
CUFF TOURN SGL QUICK 34 (TOURNIQUET CUFF) ×1
CUFF TOURN SGL QUICK 42 (TOURNIQUET CUFF) IMPLANT
CUFF TRNQT CYL 34X4.125X (TOURNIQUET CUFF) ×1 IMPLANT
DRAPE EXTREMITY T 121X128X90 (DISPOSABLE) ×1 IMPLANT
DRAPE HALF SHEET 40X57 (DRAPES) ×2 IMPLANT
DRAPE U-SHAPE 47X51 STRL (DRAPES) ×1 IMPLANT
DRSG ADAPTIC 3X8 NADH LF (GAUZE/BANDAGES/DRESSINGS) ×1 IMPLANT
DURAPREP 26ML APPLICATOR (WOUND CARE) ×1 IMPLANT
ELECT REM PT RETURN 9FT ADLT (ELECTROSURGICAL) ×1
ELECTRODE REM PT RTRN 9FT ADLT (ELECTROSURGICAL) ×1 IMPLANT
FACESHIELD WRAPAROUND (MASK) IMPLANT
FACESHIELD WRAPAROUND OR TEAM (MASK) ×1 IMPLANT
FEMORAL KNEE COMP SZ 8 STND LT (Knees) ×1 IMPLANT
FEMORAL KNEE COMP SZ 8STD LT (Knees) IMPLANT
GAUZE PAD ABD 8X10 STRL (GAUZE/BANDAGES/DRESSINGS) ×1 IMPLANT
GAUZE SPONGE 4X4 12PLY STRL (GAUZE/BANDAGES/DRESSINGS) ×1 IMPLANT
GLOVE BIOGEL PI IND STRL 9 (GLOVE) ×1 IMPLANT
GLOVE SURG ORTHO 9.0 STRL STRW (GLOVE) ×1 IMPLANT
GOWN STRL REUS W/ TWL XL LVL3 (GOWN DISPOSABLE) ×2 IMPLANT
GOWN STRL REUS W/TWL XL LVL3 (GOWN DISPOSABLE) ×2
HANDPIECE INTERPULSE COAX TIP (DISPOSABLE) ×1
HDLS TROCR DRIL PIN KNEE 75 (PIN) ×1
KIT BASIN OR (CUSTOM PROCEDURE TRAY) ×1 IMPLANT
KIT TURNOVER KIT B (KITS) ×1 IMPLANT
MANIFOLD NEPTUNE II (INSTRUMENTS) ×1 IMPLANT
NS IRRIG 1000ML POUR BTL (IV SOLUTION) ×1 IMPLANT
PACK TOTAL JOINT (CUSTOM PROCEDURE TRAY) ×1 IMPLANT
PAD ARMBOARD 7.5X6 YLW CONV (MISCELLANEOUS) ×1 IMPLANT
PAD COLD SHLDR WRAP-ON (PAD) ×1 IMPLANT
PIN DRILL HDLS TROCAR 75 4PK (PIN) IMPLANT
SCREW FEMALE HEX FIX 25X2.5 (ORTHOPEDIC DISPOSABLE SUPPLIES) IMPLANT
SET HNDPC FAN SPRY TIP SCT (DISPOSABLE) ×1 IMPLANT
STAPLER VISISTAT 35W (STAPLE) ×1 IMPLANT
STEM POLY PAT PLY 32M KNEE (Knees) IMPLANT
STEM TIB ST PERS 14+30 (Stem) IMPLANT
STEM TIBIA 5 DEG SZ F L KNEE (Knees) IMPLANT
STEM TIBIAL 10 8-11 EF POLY LT (Joint) IMPLANT
SUCTION FRAZIER HANDLE 10FR (MISCELLANEOUS) ×1
SUCTION TUBE FRAZIER 10FR DISP (MISCELLANEOUS) IMPLANT
SUT VIC AB 0 CT1 27 (SUTURE) ×1
SUT VIC AB 0 CT1 27XBRD ANBCTR (SUTURE) ×1 IMPLANT
SUT VIC AB 1 CTX 36 (SUTURE)
SUT VIC AB 1 CTX36XBRD ANBCTR (SUTURE) IMPLANT
TIBIA STEM 5 DEG SZ F L KNEE (Knees) ×1 IMPLANT
TOWEL GREEN STERILE (TOWEL DISPOSABLE) ×1 IMPLANT
TOWEL GREEN STERILE FF (TOWEL DISPOSABLE) ×1 IMPLANT

## 2022-12-26 NOTE — Discharge Instructions (Signed)

## 2022-12-26 NOTE — Transfer of Care (Signed)
Immediate Anesthesia Transfer of Care Note  Patient: Alison Whitehead  Procedure(s) Performed: LEFT TOTAL KNEE ARTHROPLASTY (Left: Knee)  Patient Location: PACU  Anesthesia Type:GA combined with regional for post-op pain  Level of Consciousness: drowsy  Airway & Oxygen Therapy: Patient Spontanous Breathing and Patient connected to face mask oxygen  Post-op Assessment: Report given to RN and Post -op Vital signs reviewed and stable  Post vital signs: Reviewed and stable  Last Vitals:  Vitals Value Taken Time  BP 88/66 12/26/22 1030  Temp    Pulse 68 12/26/22 1034  Resp 13 12/26/22 1034  SpO2 94 % 12/26/22 1034  Vitals shown include unvalidated device data.  Last Pain:  Vitals:   12/26/22 0720  TempSrc:   PainSc: 8       Patients Stated Pain Goal: 1 (12/26/22 0720)  Complications: No notable events documented.

## 2022-12-26 NOTE — Plan of Care (Signed)

## 2022-12-26 NOTE — Anesthesia Postprocedure Evaluation (Signed)
Anesthesia Post Note  Patient: Alison Whitehead  Procedure(s) Performed: LEFT TOTAL KNEE ARTHROPLASTY (Left: Knee)     Patient location during evaluation: PACU Anesthesia Type: Regional and General Level of consciousness: awake and alert Pain management: pain level controlled Vital Signs Assessment: post-procedure vital signs reviewed and stable Respiratory status: spontaneous breathing, nonlabored ventilation, respiratory function stable and patient connected to nasal cannula oxygen Cardiovascular status: blood pressure returned to baseline and stable Postop Assessment: no apparent nausea or vomiting Anesthetic complications: no   No notable events documented.  Last Vitals:  Vitals:   12/26/22 1115 12/26/22 1215  BP: 100/72 100/68  Pulse: 70   Resp: 18 18  Temp:  36.6 C  SpO2: 91% 94%    Last Pain:  Vitals:   12/26/22 1115  TempSrc:   PainSc: 10-Worst pain ever                 Earl Lites P Auri Jahnke

## 2022-12-26 NOTE — Op Note (Signed)
DATE OF SURGERY:  12/26/2022  TIME: 10:39 AM  PATIENT NAME:  Alison Whitehead    AGE: 72 y.o.    PRE-OPERATIVE DIAGNOSIS:  Osteoarthritis Left Knee  POST-OPERATIVE DIAGNOSIS:  Osteoarthritis Left Knee  PROCEDURE:  Procedure(s): LEFT TOTAL KNEE ARTHROPLASTY  SURGEON: Aldean Baker  ASSISTANT: Dixon  OPERATIVE IMPLANTS: Zimmer Persona.  Femur size 8, Tibia size F, Patella size 32 3-peg oval button, with a 10 mm polyethylene insert medial congruent.  @ENCIMAGES @  Implant Name Type Inv. Item Serial No. Manufacturer Lot No. LRB No. Used Action  CEMENT BONE R 1X40 - [id number] Cement CEMENT BONE R 1X40  ZIMMER RECON(ORTH,TRAU,BIO,SG) [id number] Left 1 Implanted  CEMENT BONE R 1X40 - [id number] Cement CEMENT BONE R 1X40  [employer]IMMER [employer]ON(ORTH,TRAU,BIO,SG) [id number] Left 1 Implanted  STEM POLY PAT PLY 18M KNEE - [id number] Knees STEM POLY PAT PLY 18M KNEE  ZIMMER RECON(ORTH,TRAU,BIO,SG) [id number] Left 1 Implanted  TIBIA STEM 5 DEG SZ F L KNEE - [id number] Knees TIBIA STEM 5 DEG SZ F L KNEE  ZIMMER RECON(ORTH,TRAU,BIO,SG) [id number] Left 1 Implanted  STEM TIBIAL 10 8-11 EF POLY LT - [id number] Joint STEM TIBIAL 10 8-11 EF POLY LT  ZIMMER RECON(ORTH,TRAU,BIO,SG) [id number] Left 1 Implanted  STEM TIB ST PERS 14+30 - [id number] Stem STEM TIB ST PERS 14+30  ZIMMER RECON(ORTH,TRAU,BIO,SG) [id number] Left 1 Implanted  FEMORAL KNEE COMP SZ 8 STND LT - [id number] Knees FEMORAL KNEE COMP SZ 8 STND LT  ZIMMER RECON(ORTH,TRAU,BIO,SG) [id number] Left 1 Implanted      PREOPERATIVE INDICATIONS:   Alison Whitehead is a 72 y.o. year old female with end stage degenerative arthritis of the knee who failed conservative treatment and elected for Total Knee Arthroplasty.   The risks, benefits, and alternatives were discussed at length including but not limited to the risks of infection, bleeding, nerve injury, stiffness, blood clots, the need for revision surgery, cardiopulmonary complications, among others, and  they were willing to proceed.  OPERATIVE DESCRIPTION:  The patient was brought to the operative room and placed in a supine position.  General anesthesia was administered.  IV antibiotics were given.  The lower extremity was prepped and draped in the usual sterile fashion.  Alison Whitehead was used to cover all exposed skin. Time out was performed.    Anterior quadriceps tendon splitting approach was performed.  The patella was everted and osteophytes were removed.  The anterior horn of the medial and lateral meniscus was removed.   The distal femur was opened with the drill and the intramedullary distal femoral cutting jig was utilized, set at 5 degrees valgus resecting 9 mm off the distal femur.  Care was taken to protect the collateral ligaments.  Then the extramedullary tibial cutting jig was utilized set for 3 degree posterior slope.  Care was taken during the cut to protect the medial and collateral ligaments.  The proximal tibia was removed along with the posterior horns of the menisci.  The PCL was sacrificed.    The extensor gap was measured and was approximately 10 mm.    The distal femoral sizing jig was applied, taking care to avoid notching.  Then the 4-in-1 cutting jig was applied and the anterior and posterior femur was cut, along with the chamfer cuts.  All posterior osteophytes were removed.  The flexion gap was then measured and was symmetric with the extension gap.  The distal femoral preparation using the appropriate jig to prepare the box.  The patella was then measured, and cut with the saw.  The proximal tibia sized and prepared accordingly with the reamer and the punch, and then all components were trialed with the poly insert.  The knee was found to have stable balance and full motion.  The knee was irrigated with normal saline and the knee was soaked with TXA.  The above named components were then cemented into place and all excess cement was removed.  The final polyethylene  component was in place during cementation.  The knee was kept in extension until the cement hardened.  The knee was then taken through a range of motion and the patella tracked well and the knee irrigated copiously and the parapatellar and subcutaneous tissue closed with vicryl, and skin closed with staples..  A sterile dressing was applied and patient  was taken to the PACU in stable  condition.  There were no complications.  Total tourniquet time was 54 minutes.

## 2022-12-26 NOTE — Anesthesia Procedure Notes (Signed)
Procedure Name: LMA Insertion Date/Time: 12/26/2022 8:58 AM  Performed by: Marny Lowenstein, CRNAPre-anesthesia Checklist: Patient identified, Emergency Drugs available, Suction available and Patient being monitored Patient Re-evaluated:Patient Re-evaluated prior to induction Oxygen Delivery Method: Circle system utilized Preoxygenation: Pre-oxygenation with 100% oxygen Induction Type: IV induction Ventilation: Mask ventilation without difficulty and Oral airway inserted - appropriate to patient size LMA: LMA with gastric port inserted LMA Size: 4.0 Number of attempts: 1 Placement Confirmation: positive ETCO2 and breath sounds checked- equal and bilateral Tube secured with: Tape Dental Injury: Teeth and Oropharynx as per pre-operative assessment

## 2022-12-26 NOTE — H&P (Signed)
TOTAL KNEE ADMISSION H&P  Patient is being admitted for left total knee arthroplasty.  Subjective:  Chief Complaint:left knee pain.  HPI: Alison Whitehead, 72 y.o. female, has a history of pain and functional disability in the left knee due to arthritis and has failed non-surgical conservative treatments for greater than 12 weeks to includeNSAID's and/or analgesics, corticosteriod injections, use of assistive devices, weight reduction as appropriate, and activity modification.  Onset of symptoms was gradual, starting 8 years ago with gradually worsening course since that time. The patient noted no past surgery on the left knee(s).  Patient currently rates pain in the left knee(s) at 8 out of 10 with activity. Patient has night pain, worsening of pain with activity and weight bearing, pain that interferes with activities of daily living, pain with passive range of motion, crepitus, and joint swelling.  Patient has evidence of subchondral cysts, subchondral sclerosis, periarticular osteophytes, joint subluxation, and joint space narrowing by imaging studies. This patient has had avascular necrosis of the knee. There is no active infection.  Patient Active Problem List   Diagnosis Date Noted   Rotator cuff syndrome, right 01/11/2020   Labile blood glucose    Vascular headache    Small vessel disease, cerebrovascular    Prediabetes    AKI (acute kidney injury) (HCC)    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   Acute cerebrovascular accident (CVA) (HCC) 09/30/2019   Unilateral primary osteoarthritis, left knee 03/17/2019   Unilateral primary osteoarthritis, right knee 03/17/2019   Bilateral primary osteoarthritis of knee 03/10/2019   INSOMNIA, CHRONIC 05/19/2007   SPONDYLOSIS, LUMBAR 05/19/2007   Essential hypertension 05/03/2007   ARTHRITIS, KNEE 05/03/2007   Past Medical History:  Diagnosis Date   Allergy    seasonal   Arthritis    CKD (chronic kidney disease)    DDD (degenerative  disc disease)    Degenerative disc disease, lumbar    Hypertension    IBS (irritable bowel syndrome)    Osteopenia    Osteoporosis    osteopenia   Stroke (HCC) 09/2019    Past Surgical History:  Procedure Laterality Date   SHOULDER SURGERY Left    lipoma    Current Facility-Administered Medications  Medication Dose Route Frequency Provider Last Rate Last Admin   tranexamic acid (CYKLOKAPRON) 2,000 mg in sodium chloride 0.9 % 50 mL Topical Application  2,000 mg Topical To OR Nadara Mustard, MD       Current Outpatient Medications  Medication Sig Dispense Refill Last Dose   amLODipine-olmesartan (AZOR) 10-40 MG tablet Take 1 tablet by mouth daily.      aspirin EC 81 MG tablet Take 1 tablet (81 mg total) by mouth daily.      atorvastatin (LIPITOR) 40 MG tablet Take 1 tablet (40 mg total) by mouth daily. 90 tablet 1    carvedilol (COREG) 25 MG tablet Take 25 mg by mouth 2 (two) times daily.      dapagliflozin propanediol (FARXIGA) 10 MG TABS tablet Take 1 tablet (10 mg total) by mouth daily. 90 tablet 3    dicyclomine (BENTYL) 10 MG capsule Take 1 to 2 Capsules by mouth four times daily, as needed, FOR IBS (Patient taking differently: Take 10-20 mg by mouth 4 (four) times daily as needed for spasms.) 120 capsule 0    diphenhydrAMINE (BENADRYL) 25 mg capsule Take 25 mg by mouth daily.      ferrous sulfate 325 (65 FE) MG tablet Take 325 mg by mouth 3 (three) times a  week.      HYDROcodone-acetaminophen (NORCO) 10-325 MG tablet Take 1 tablet by mouth 5 (five) times daily as needed. 150 tablet 0    Melatonin 5 MG CAPS Take 5 mg by mouth at bedtime.      metFORMIN (GLUCOPHAGE) 500 MG tablet Take 500 mg by mouth daily.      RYBELSUS 14 MG TABS Take 14 mg by mouth every evening.      torsemide (DEMADEX) 5 MG tablet Take 5 mg by mouth daily.      albuterol (PROAIR HFA) 108 (90 Base) MCG/ACT inhaler Inhale 1 puff into the lungs 4 (four) times daily as needed. (Patient not taking: Reported on  12/22/2022) 8.5 g 5 Not Taking   carvedilol (COREG) 6.25 MG tablet Take 1 tablet (6.25 mg total) by mouth 2 (two) times daily with a meal. (Patient not taking: Reported on 12/22/2022) 60 tablet 0 Not Taking   cetirizine (ZYRTEC) 10 MG tablet TAKE 1 TABLET BY MOUTH AT BEDTIME (Patient not taking: Reported on 12/22/2022) 90 tablet 0 Not Taking   COVID-19 mRNA Vac-TriS, Pfizer, SUSP injection Inject into the muscle. 0.3 mL 0    CREON 36000-114000 units CPEP capsule Take 36,000-72,000 Units by mouth See admin instructions. Take 72,000 units by mouth daily with a meal. Take 36,000 units by mouth daily with a snack.      diclofenac sodium (VOLTAREN) 1 % GEL Apply 2 g topically 4 (four) times daily. (Patient not taking: Reported on 12/22/2022) 350 g 1 Not Taking   lisinopril (ZESTRIL) 20 MG tablet Take 1 tablet (20 mg total) by mouth daily. (Patient not taking: Reported on 10/22/2022) 90 tablet 1 Not Taking   Menthol-Methyl Salicylate (MUSCLE RUB) 10-15 % CREA Apply 1 application topically 2 (two) times daily as needed for muscle pain. To bilateral knees  0    metoprolol succinate (TOPROL-XL) 25 MG 24 hr tablet Take 1 tablet (25 mg total) by mouth daily. (Patient not taking: Reported on 12/22/2022) 90 tablet 2 Not Taking   naloxone (NARCAN) nasal spray 4 mg/0.1 mL SMARTSIG:Both Nares (Patient not taking: Reported on 10/22/2022)      phentermine 30 MG capsule Take 30 mg by mouth daily.      polyethylene glycol (MIRALAX / GLYCOLAX) 17 g packet Take 17 g by mouth daily. (Patient not taking: Reported on 12/22/2022) 30 each 0 Not Taking   predniSONE (DELTASONE) 5 MG tablet Take 6 tablets by mouth on day 1, then 5 tabs on day 2, 4 tabs on day 3, 3 tabs on day 4, 2 tabs on day 5, and 1 tab on day 6. (Patient not taking: Reported on 12/22/2022) 21 tablet 1 Completed Course   Semaglutide (RYBELSUS) 7 MG TABS Take 1 (one) Tablet by mouth in the evening 90 tablet 1    senna-docusate (SENOKOT-S) 8.6-50 MG tablet Take 2 tablets by  mouth at bedtime. (Patient not taking: Reported on 12/22/2022) 60 tablet 0 Not Taking   topiramate (TOPAMAX) 50 MG tablet Take 1 tablet (50 mg total) by mouth 2 (two) times daily. (Patient not taking: Reported on 12/22/2022) 60 tablet 0 Not Taking   verapamil (CALAN-SR) 240 MG CR tablet Take 1 tablet (240 mg total) by mouth daily. (Patient not taking: Reported on 12/22/2022) 90 tablet 1 Not Taking   Vitamin D, Ergocalciferol, (DRISDOL) 1.25 MG (50000 UNIT) CAPS capsule Take 1 capsule (50,000 Units total) by mouth once a week. (Patient not taking: Reported on 10/22/2022) 12 capsule 1 Not Taking  Wheat Dextrin (BENEFIBER) POWD Take 1 (one) scoopful by mouth as directed (Patient not taking: Reported on 12/22/2022) 90 g 1 Not Taking   Allergies  Allergen Reactions   Penicillins Itching    Social History   Tobacco Use   Smoking status: Former    Types: Cigarettes    Quit date: 10/13/2019    Years since quitting: 3.2   Smokeless tobacco: Never  Substance Use Topics   Alcohol use: Never    Family History  Problem Relation Age of Onset   Breast cancer Maternal Aunt    Diabetes Mother    Hypertension Mother    Glaucoma Mother    Other Father        accident   Diabetes Sister    Hypertension Sister    Cancer Sister    Colon cancer Neg Hx    Colon polyps Neg Hx    Esophageal cancer Neg Hx    Stomach cancer Neg Hx    Rectal cancer Neg Hx      Review of Systems  All other systems reviewed and are negative.   Objective:  Physical Exam  Vital signs in last 24 hours:    Labs:   Estimated body mass index is 37.28 kg/m as calculated from the following:   Height as of 12/22/22: 5\' 6"  (1.676 m).   Weight as of 12/22/22: 104.8 kg.   Imaging Review Plain radiographs demonstrate moderate degenerative joint disease of the left knee(s). The overall alignment isneutral. The bone quality appears to be adequate for age and reported activity level.      Assessment/Plan:  End stage  arthritis, left knee   The patient history, physical examination, clinical judgment of the provider and imaging studies are consistent with end stage degenerative joint disease of the left knee(s) and total knee arthroplasty is deemed medically necessary. The treatment options including medical management, injection therapy arthroscopy and arthroplasty were discussed at length. The risks and benefits of total knee arthroplasty were presented and reviewed. The risks due to aseptic loosening, infection, stiffness, patella tracking problems, thromboembolic complications and other imponderables were discussed. The patient acknowledged the explanation, agreed to proceed with the plan and consent was signed. Patient is being admitted for inpatient treatment for surgery, pain control, PT, OT, prophylactic antibiotics, VTE prophylaxis, progressive ambulation and ADL's and discharge planning. The patient is planning to be discharged home with home health services     Patient's anticipated LOS is less than 2 midnights, meeting these requirements: - Younger than 51 - Lives within 1 hour of care - Has a competent adult at home to recover with post-op recover - NO history of  - Chronic pain requiring opiods  - Diabetes  - Coronary Artery Disease  - Heart failure  - Heart attack  - Stroke  - DVT/VTE  - Cardiac arrhythmia  - Respiratory Failure/COPD  - Renal failure  - Anemia  - Advanced Liver disease

## 2022-12-26 NOTE — Evaluation (Signed)
Physical Therapy Evaluation Patient Details Name: Alison Whitehead MRN: [id number] DOB: [birth year] Today's Date: 12/26/2022  History of Present Illness  72 y.o. female presents to [facility] hospital on 12/26/2022 for elective L TKA. PMH includes CKD, DDD, HTN, IBS, HTN, osteoporosis, CVA.  Clinical Impression  Pt presents to PT with deficits in strength, power, ROM, gait, balance, endurance. Pt reports significant pain with bed mobility and brief transfer attempts this session, greatly limiting mobility at this time. Pt is unable to stand despite PT assistance due to LE weakness, as well as hospital bed malfunction leading to the bed moving into reverse trendelenburg when pt sits at edge of bed. RN made aware of this defect, to assist with bed exchange. Pt reports she has a desire to go to short term inpatient rehab due to no identified caregiver support. Due to assistance requirements this session this seems appropriate. PT will follow up tomorrow for further mobility training.       Recommendations for follow up therapy are one component of a multi-disciplinary discharge planning process, led by the attending physician.  Recommendations may be updated based on patient status, additional functional criteria and insurance authorization.  Follow Up Recommendations Can patient physically be transported by private vehicle: No     Assistance Recommended at Discharge Frequent or constant Supervision/Assistance  Patient can return home with the following  A lot of help with walking and/or transfers;A lot of help with bathing/dressing/bathroom;Assistance with cooking/housework;Assist for transportation;Help with stairs or ramp for entrance    Equipment Recommendations BSC/3in1  Recommendations for Other Services       Functional Status Assessment Patient has had a recent decline in their functional status and demonstrates the ability to make significant improvements in function in a reasonable and  predictable amount of time.     Precautions / Restrictions Precautions Precautions: Fall Required Braces or Orthoses: Knee Immobilizer - Left Restrictions Weight Bearing Restrictions: Yes LLE Weight Bearing: Weight bearing as tolerated      Mobility  Bed Mobility Overal bed mobility: Needs Assistance Bed Mobility: Supine to Sit, Sit to Supine     Supine to sit: Mod assist, HOB elevated Sit to supine: Mod assist, HOB elevated        Transfers Overall transfer level: Needs assistance                 General transfer comment: attempted to stand but pt unable to clear buttocks from elevated bed surface despite PT physical assistance. Pt sliding anteriorly to edge of bed, 3rd attempt deferred due to safety concerns and pt pain    Ambulation/Gait                  Stairs            Wheelchair Mobility    Modified Rankin (Stroke Patients Only)       Balance Overall balance assessment: Needs assistance Sitting-balance support: No upper extremity supported, Feet supported Sitting balance-Leahy Scale: Fair                                       Pertinent Vitals/Pain Pain Assessment Pain Assessment: Faces Faces Pain Scale: Hurts even more Pain Location: L knee Pain Descriptors / Indicators: Aching Pain Intervention(s): Patient requesting pain meds-RN notified    Home Living Family/patient expects to be discharged to:: Skilled nursing facility Living Arrangements: Alone  Additional Comments: pt lives in a one level apartment, level entry, tub shower, standard commode. Pt has a SPC and RW. Pt reports she is hoping to discharge to rehab due to lack of caregiver support. she reports she has not discussed this with Dr. Lajoyce Corners    Prior Function Prior Level of Function : Independent/Modified Independent             Mobility Comments: ambulates with SPC, PRN RW       Hand Dominance        Extremity/Trunk  Assessment   Upper Extremity Assessment Upper Extremity Assessment: Overall WFL for tasks assessed    Lower Extremity Assessment Lower Extremity Assessment: LLE deficits/detail LLE Deficits / Details: ROM restrictions as expected s/p TKA, DF/PF 4-/5, knee flexion/extension 3/5 within limited range    Cervical / Trunk Assessment Cervical / Trunk Assessment: Normal  Communication   Communication: No difficulties  Cognition Arousal/Alertness: Awake/alert Behavior During Therapy: WFL for tasks assessed/performed Overall Cognitive Status: Within Functional Limits for tasks assessed                                          General Comments General comments (skin integrity, edema, etc.): VSS on RA    Exercises Other Exercises Other Exercises: PT provides education on TKA handout   Assessment/Plan    PT Assessment Patient needs continued PT services  PT Problem List Decreased strength;Decreased range of motion;Decreased activity tolerance;Decreased balance;Decreased mobility;Decreased knowledge of use of DME;Pain       PT Treatment Interventions DME instruction;Gait training;Functional mobility training;Therapeutic activities;Therapeutic exercise;Balance training;Neuromuscular re-education;Patient/family education    PT Goals (Current goals can be found in the Care Plan section)  Acute Rehab PT Goals Patient Stated Goal: to go to short term rehab, return to independence PT Goal Formulation: With patient Time For Goal Achievement: 01/09/23 Potential to Achieve Goals: Fair    Frequency 7X/week     Co-evaluation               AM-PAC PT "6 Clicks" Mobility  Outcome Measure Help needed turning from your back to your side while in a flat bed without using bedrails?: A Lot Help needed moving from lying on your back to sitting on the side of a flat bed without using bedrails?: A Lot Help needed moving to and from a bed to a chair (including a wheelchair)?:  Total Help needed standing up from a chair using your arms (e.g., wheelchair or bedside chair)?: Total Help needed to walk in hospital room?: Total Help needed climbing 3-5 steps with a railing? : Total 6 Click Score: 8    End of Session Equipment Utilized During Treatment: Gait belt Activity Tolerance: Patient limited by pain Patient left: in bed;with call bell/phone within reach;with bed alarm set Nurse Communication: Mobility status PT Visit Diagnosis: Other abnormalities of gait and mobility (R26.89);Muscle weakness (generalized) (M62.81);Pain Pain - Right/Left: Left Pain - part of body: Knee    Time: 1645-1710 PT Time Calculation (min) (ACUTE ONLY): 25 min   Charges:   PT Evaluation $PT Eval Low Complexity: 1 Low          Arlyss Gandy, PT, DPT Acute Rehabilitation Office [phone]   Arlyss Gandy 12/26/2022, 5:31 PM

## 2022-12-26 NOTE — Anesthesia Procedure Notes (Signed)
Anesthesia Regional Block: Adductor canal block   Pre-Anesthetic Checklist: , timeout performed,  Correct Patient, Correct Site, Correct Laterality,  Correct Procedure, Correct Position, site marked,  Risks and benefits discussed,  Surgical consent,  Pre-op evaluation,  At surgeon's request and post-op pain management  Laterality: Left  Prep: Dura Prep       Needles:  Injection technique: Single-shot  Needle Type: Echogenic Stimulator Needle     Needle Length: 10cm  Needle Gauge: 20     Additional Needles:   Procedures:,,,, ultrasound used (permanent image in chart),,    Narrative:  Start time: 12/26/2022 8:20 AM End time: 12/26/2022 8:25 AM Injection made incrementally with aspirations every 5 mL.  Performed by: Personally  Anesthesiologist: Atilano Median, DO  Additional Notes: Patient identified. Risks/Benefits/Options discussed with patient including but not limited to bleeding, infection, nerve damage, failed block, incomplete pain control. Patient expressed understanding and wished to proceed. All questions were answered. Sterile technique was used throughout the entire procedure. Please see nursing notes for vital signs. Aspirated in 5cc intervals with injection for negative confirmation. Patient was given instructions on fall risk and not to get out of bed. All questions and concerns addressed with instructions to call with any issues or inadequate analgesia.

## 2022-12-27 ENCOUNTER — Encounter (HOSPITAL_COMMUNITY): Payer: Self-pay | Admitting: Orthopedic Surgery

## 2022-12-27 LAB — GLUCOSE, CAPILLARY
Glucose-Capillary: 165 mg/dL — ABNORMAL HIGH (ref 70–99)
Glucose-Capillary: 141 mg/dL — ABNORMAL HIGH (ref 70–99)
Glucose-Capillary: 136 mg/dL — ABNORMAL HIGH (ref 70–99)

## 2022-12-27 MED ORDER — HYDROCODONE-ACETAMINOPHEN 7.5-325 MG PO TABS
1.0000 | ORAL_TABLET | ORAL | Status: DC | PRN
  Administered 2022-12-27 – 2022-12-29 (×7): 2 via ORAL
  Administered 2022-12-29 – 2022-12-31 (×7): 1 via ORAL
  Administered 2022-12-31: 2 via ORAL
  Administered 2022-12-31: 1 via ORAL
  Administered 2022-12-31: 2 via ORAL
  Administered 2022-12-31: 1 via ORAL
  Filled 2022-12-27: qty 1
  Filled 2022-12-27 (×4): qty 2
  Filled 2022-12-27 (×5): qty 1
  Filled 2022-12-27: qty 2
  Filled 2022-12-27: qty 1
  Filled 2022-12-27: qty 2
  Filled 2022-12-27 (×2): qty 1
  Filled 2022-12-27 (×3): qty 2

## 2022-12-27 NOTE — Progress Notes (Signed)
PT Cancellation Note  Patient Details Name: Alison Whitehead MRN: [id number] DOB: 09-28-1950   Cancelled Treatment:    Reason Eval/Treat Not Completed: (P) Pain limiting ability to participate, pt declining PM session, stating her pain level is too high despite pre medication. Will check back tomorrow to continue with PT POC.    Marlana Salvage Christopher Hink 12/27/2022, 3:28 PM

## 2022-12-27 NOTE — Progress Notes (Signed)
Patient ID: Alison Whitehead, female   DOB: Jan 15, 1951, 72 y.o.   MRN: [id number] Patient is postoperative day 1 left total knee arthroplasty.  Patient sat up at bedside yesterday did not ambulate.  She has a knee immobilizer on.  Uncertain at this time why the immobilizer in place.  Therapy to evaluate patient to see if she needs the immobilizer for ambulation.  Patient will definitely need discharge to short-term skilled nursing.  Patient is in agreement and wishes to discharge to skilled nursing.

## 2022-12-27 NOTE — Progress Notes (Signed)
Physical Therapy Treatment Patient Details Name: Alison Whitehead MRN: [id number] DOB: 1951/06/15 Today's Date: 12/27/2022   History of Present Illness 72 y.o. female presents to [facility] hospital on 12/26/2022 for elective L TKA. PMH includes CKD, DDD, HTN, IBS, HTN, osteoporosis, CVA.    PT Comments    Pt greeted resting in bed and agreeable to session, however pt greatly limited by pain, despite pre-medication coordination with RN. Pt unable to progress standing trials this session, demonstrating poor initiation and decreased strength on R, unable to lift hips from sitting surface with elevated EOB and max A provided. Pt internally distracted by pain throughout session, verbalizing with all movements. Pt with good participation with LE therex for increased strength and ROM with cues for technique and hands on assist for increased ROM. Current plan remains appropriate to address deficits and maximize functional independence and decrease caregiver burden. Pt continues to benefit from skilled PT services to progress toward functional mobility goals.    Recommendations for follow up therapy are one component of a multi-disciplinary discharge planning process, led by the attending physician.  Recommendations may be updated based on patient status, additional functional criteria and insurance authorization.  Follow Up Recommendations  Can patient physically be transported by private vehicle: No    Assistance Recommended at Discharge Frequent or constant Supervision/Assistance  Patient can return home with the following A lot of help with walking and/or transfers;A lot of help with bathing/dressing/bathroom;Assistance with cooking/housework;Assist for transportation;Help with stairs or ramp for entrance   Equipment Recommendations  BSC/3in1    Recommendations for Other Services       Precautions / Restrictions Precautions Precautions: Fall Required Braces or Orthoses: Knee Immobilizer -  Left Restrictions Weight Bearing Restrictions: Yes LLE Weight Bearing: Weight bearing as tolerated     Mobility  Bed Mobility Overal bed mobility: Needs Assistance Bed Mobility: Supine to Sit, Sit to Supine     Supine to sit: Mod assist, HOB elevated Sit to supine: Max assist   General bed mobility comments: mod A to manage LE, max A to return to supine as pt crying out in pain    Transfers Overall transfer level: Needs assistance                 General transfer comment: attempted to stand but pt unable to generate any power through LLE, not elevating hips from sitting surface and crying out "i can't do this" further attempts deferred for pt safety as pt continued to scoot furhter out on EOB despite cues to scoot back    Ambulation/Gait                   Stairs             Wheelchair Mobility    Modified Rankin (Stroke Patients Only)       Balance Overall balance assessment: Needs assistance Sitting-balance support: No upper extremity supported, Feet supported Sitting balance-Leahy Scale: Fair                                      Cognition Arousal/Alertness: Awake/alert Behavior During Therapy: WFL for tasks assessed/performed Overall Cognitive Status: Within Functional Limits for tasks assessed  Exercises Total Joint Exercises Ankle Circles/Pumps: AROM, Left, 10 reps Heel Slides: AAROM, Left, 10 reps, Supine Hip ABduction/ADduction: AAROM, Left, 10 reps, Supine Straight Leg Raises: AAROM, Left, 5 reps, Supine Other Exercises Other Exercises: long axis ir/er LLE x10    General Comments General comments (skin integrity, edema, etc.): VSS On RA      Pertinent Vitals/Pain Pain Assessment Pain Assessment: Faces Faces Pain Scale: Hurts worst Pain Location: L knee Pain Descriptors / Indicators: Aching, Crying Pain Intervention(s): Premedicated before session,  Monitored during session, Limited activity within patient's tolerance, Ice applied    Home Living                          Prior Function            PT Goals (current goals can now be found in the care plan section) Acute Rehab PT Goals Patient Stated Goal: to go to short term rehab, return to independence PT Goal Formulation: With patient Time For Goal Achievement: 01/09/23 Progress towards PT goals: Not progressing toward goals - comment (pain)    Frequency    7X/week      PT Plan      Co-evaluation              AM-PAC PT "6 Clicks" Mobility   Outcome Measure  Help needed turning from your back to your side while in a flat bed without using bedrails?: A Lot Help needed moving from lying on your back to sitting on the side of a flat bed without using bedrails?: A Lot Help needed moving to and from a bed to a chair (including a wheelchair)?: Total Help needed standing up from a chair using your arms (e.g., wheelchair or bedside chair)?: Total Help needed to walk in hospital room?: Total Help needed climbing 3-5 steps with a railing? : Total 6 Click Score: 8    End of Session Equipment Utilized During Treatment: Gait belt Activity Tolerance: Patient limited by pain Patient left: in bed;with call bell/phone within reach;with bed alarm set Nurse Communication: Mobility status;Patient requests pain meds PT Visit Diagnosis: Other abnormalities of gait and mobility (R26.89);Muscle weakness (generalized) (M62.81);Pain Pain - Right/Left: Left Pain - part of body: Knee     Time: 6578-4696 PT Time Calculation (min) (ACUTE ONLY): 25 min  Charges:  $Therapeutic Exercise: 8-22 mins $Therapeutic Activity: 8-22 mins                     Alison Whitehead R. PTA Acute Rehabilitation Services Office: [phone]   Catalina Antigua 12/27/2022, 12:40 PM

## 2022-12-28 LAB — BASIC METABOLIC PANEL
Anion gap: 9 (ref 5–15)
BUN: 50 mg/dL — ABNORMAL HIGH (ref 8–23)
CO2: 17 mmol/L — ABNORMAL LOW (ref 22–32)
Calcium: 7.9 mg/dL — ABNORMAL LOW (ref 8.9–10.3)
Chloride: 108 mmol/L (ref 98–111)
Creatinine, Ser: 1.73 mg/dL — ABNORMAL HIGH (ref 0.44–1.00)
GFR, Estimated: 31 mL/min — ABNORMAL LOW (ref >60)
Glucose, Bld: 109 mg/dL — ABNORMAL HIGH (ref 70–99)
Potassium: 4.5 mmol/L (ref 3.5–5.1)
Sodium: 134 mmol/L — ABNORMAL LOW (ref 135–145)

## 2022-12-28 LAB — CBC
HCT: 33.8 % — ABNORMAL LOW (ref 36.0–46.0)
Hemoglobin: 10.3 g/dL — ABNORMAL LOW (ref 12.0–15.0)
MCH: 27.5 pg (ref 26.0–34.0)
MCHC: 30.5 g/dL (ref 30.0–36.0)
MCV: 90.4 fL (ref 80.0–100.0)
Platelets: 176 10*3/uL (ref 150–400)
RBC: 3.74 MIL/uL — ABNORMAL LOW (ref 3.87–5.11)
RDW: 14.2 % (ref 11.5–15.5)
WBC: 8.6 10*3/uL (ref 4.0–10.5)
nRBC: 0 % (ref 0.0–0.2)

## 2022-12-28 MED ORDER — CALCIUM CARBONATE 1250 (500 CA) MG PO TABS
1.0000 | ORAL_TABLET | Freq: Once | ORAL | Status: AC
  Administered 2022-12-28: 1250 mg via ORAL
  Filled 2022-12-28: qty 1

## 2022-12-28 MED ORDER — IRBESARTAN 150 MG PO TABS
150.0000 mg | ORAL_TABLET | Freq: Every day | ORAL | Status: DC
  Administered 2022-12-28 – 2022-12-31 (×3): 150 mg via ORAL
  Filled 2022-12-28 (×4): qty 1

## 2022-12-28 MED ORDER — AMLODIPINE BESYLATE 10 MG PO TABS
10.0000 mg | ORAL_TABLET | Freq: Every day | ORAL | Status: DC
  Administered 2022-12-28 – 2022-12-31 (×2): 10 mg via ORAL
  Filled 2022-12-28 (×4): qty 1

## 2022-12-28 NOTE — Progress Notes (Addendum)
Physical Therapy Treatment Patient Details Name: Alison Whitehead MRN: [id number] DOB: [birth year] Today's Date: 12/28/2022   History of Present Illness 72 y.o. female presents to [facility] hospital on 12/26/2022 for elective L TKA. PMH includes CKD, DDD, HTN, IBS, HTN, osteoporosis, CVA.    PT Comments    Continuing work on functional mobility and activity tolerance;  Session focused on functional transfers, noting pt has not yet been OOB; Opted to try a lateral scoot transfer OOB to recliner (with drop-armrest); Noted smoother progression with moving from supine to sit, still needing assist to support LLE coming off of the bed due to pain; Made good attempts at scooting hips to R for lateral scooting; ultimately, pt did not tolerate elevating the bed to allow for 'downhill' transfer, and we otepd to assist pt in laying back down;   Pt hasn't been OOB to the recliner yet this admission; will consider using the maximove next session (will need assist to support LLE during transition with Maximove)  Recommendations for follow up therapy are one component of a multi-disciplinary discharge planning process, led by the attending physician.  Recommendations may be updated based on patient status, additional functional criteria and insurance authorization.  Follow Up Recommendations  Can patient physically be transported by private vehicle: No    Assistance Recommended at Discharge Frequent or constant Supervision/Assistance  Patient can return home with the following A lot of help with walking and/or transfers;A lot of help with bathing/dressing/bathroom;Assistance with cooking/housework;Assist for transportation;Help with stairs or ramp for entrance   Equipment Recommendations  BSC/3in1    Recommendations for Other Services       Precautions / Restrictions Precautions Precautions: Fall Required Braces or Orthoses: Knee Immobilizer - Left Restrictions LLE Weight Bearing: Weight bearing as  tolerated Other Position/Activity Restrictions: Decr tolerance of movement or WBing L knee     Mobility  Bed Mobility Overal bed mobility: Needs Assistance Bed Mobility: Supine to Sit, Sit to Supine     Supine to sit: Mod assist, HOB elevated Sit to supine: +2 for physical assistance, Max assist   General bed mobility comments: Noting good use of bedrail and UEs to scoot to EOB; mod assist to support LLE coming off of the bed, as well as handheld assist to pull to sit and scoot closer to EOB to allow L foot to rest on the floor    Transfers Overall transfer level: Needs assistance   Transfers: Bed to chair/wheelchair/BSC            Lateral/Scoot Transfers: Max assist General transfer comment: Attempted lateral scooting bed to recliner on pt's R side difficulty clearing the side of the recliner (armrest down), and pt in too much pain L knee as we elevated the bed to make the transfer easier (difficult to tolerate position if L foot is not supported); pt also described feeling dizzy, adn we opted to assit pt back to bed    Ambulation/Gait                   Stairs             Wheelchair Mobility    Modified Rankin (Stroke Patients Only)       Balance     Sitting balance-Leahy Scale: Fair                                      Cognition Arousal/Alertness: Awake/alert Behavior  During Therapy: WFL for tasks assessed/performed Overall Cognitive Status: Within Functional Limits for tasks assessed                                          Exercises      General Comments General comments (skin integrity, edema, etc.): Os ats stable on room air; left pt  with bed in semi-chair position (with L lower leg propped to as much knee extension as possible), BP 93/61, MAP 73, pulse 78      Pertinent Vitals/Pain Pain Assessment Pain Assessment: Faces Faces Pain Scale: Hurts worst Pain Location: L knee Pain Descriptors /  Indicators: Aching, Crying Pain Intervention(s): Premedicated before session    Home Living                          Prior Function            PT Goals (current goals can now be found in the care plan section) Acute Rehab PT Goals Patient Stated Goal: to go to short term rehab, return to independence PT Goal Formulation: With patient Time For Goal Achievement: 01/09/23 Potential to Achieve Goals: Fair Progress towards PT goals: Progressing toward goals (extremely slowly and limited by pain)    Frequency    7X/week      PT Plan Current plan remains appropriate    Co-evaluation              AM-PAC PT "6 Clicks" Mobility   Outcome Measure  Help needed turning from your back to your side while in a flat bed without using bedrails?: A Lot Help needed moving from lying on your back to sitting on the side of a flat bed without using bedrails?: A Lot Help needed moving to and from a bed to a chair (including a wheelchair)?: Total Help needed standing up from a chair using your arms (e.g., wheelchair or bedside chair)?: Total Help needed to walk in hospital room?: Total Help needed climbing 3-5 steps with a railing? : Total 6 Click Score: 8    End of Session Equipment Utilized During Treatment: Gait belt Activity Tolerance: Patient limited by pain Patient left: in bed;with call bell/phone within reach;with bed alarm set Nurse Communication: Mobility status PT Visit Diagnosis: Other abnormalities of gait and mobility (R26.89);Muscle weakness (generalized) (M62.81);Pain Pain - Right/Left: Left Pain - part of body: Knee     Time: 1610-9604 PT Time Calculation (min) (ACUTE ONLY): 40 min  Charges:  $Therapeutic Activity: 38-52 mins                     Van Clines, PT  Acute Rehabilitation Services Office [phone] Secure Chat welcomed    Levi Aland 12/28/2022, 4:22 PM

## 2022-12-28 NOTE — Progress Notes (Signed)
Patient ID: Alison Whitehead, female   DOB: [birth year], 72 y.o.   MRN: [id number] Patient is postoperative day 2 left total knee arthroplasty.  She feels her knee pain is significantly improved from yesterday. She has been icing the knee regularly and feels that is helping. She understands plan for SNF and has selected a facility.  EHL/TA/GSC intact. SILT in s/s/dp/sp/t nerve distributions. Foot warm and well perfused.   Weight bear as tolerated LLE. PT evaluate and treat. Pain control. Patient will need discharge to short-term skilled nursing.

## 2022-12-28 NOTE — TOC Initial Note (Signed)
Transition of Care [facility]) - Initial/Assessment Note    Patient Details  Name: Alison Whitehead MRN: [id number] Date of Birth: 1950/09/06  [facility] CM/SW Contact:    Georgie Chard, LCSW Phone Number: 12/28/2022, 1:26 PM  Clinical Narrative:                 This CSW spoke to the patient about DC plan. At this time the patient would like to go to [facility]. This CSW explained that TOC has no control over bed offers however this CSW will note patient's preference. This CSW also reached out to [facility] at [facility] to inform her of the patient wanting to come. The patient is expected to DC by Tuesday. At this time this CSW has sent the patient out. As bed offer come please present to the patient.   Expected Discharge Plan: Skilled Nursing Facility Barriers to Discharge: No Barriers Identified   Patient Goals and CMS Choice Patient states their goals for this hospitalization and ongoing recovery are:: Patient would like to go to a rehab  [facility] is the patient;s prefrence. CMS Medicare.gov Compare Post Acute Care list provided to:: Patient        Expected Discharge Plan and Services       Living arrangements for the past 2 months: Single Family Home                                      Prior Living Arrangements/Services Living arrangements for the past 2 months: Single Family Home Lives with:: Self Patient language and need for interpreter reviewed:: No        Need for Family Participation in Patient Care: No (Comment) Care giver support system in place?: Yes (comment)   Criminal Activity/Legal Involvement Pertinent to Current Situation/Hospitalization: No - Comment as needed  Activities of Daily Living Home Assistive Devices/Equipment: Cane (specify quad or straight), Walker (specify type) ADL Screening (condition at time of admission) Patient's cognitive ability adequate to safely complete daily activities?: Yes Is the patient deaf or have difficulty hearing?:  No Does the patient have difficulty seeing, even when wearing glasses/contacts?: No Does the patient have difficulty concentrating, remembering, or making decisions?: No Patient able to express need for assistance with ADLs?: Yes Does the patient have difficulty dressing or bathing?: Yes Independently performs ADLs?: Yes (appropriate for developmental age) Does the patient have difficulty walking or climbing stairs?: Yes Weakness of Legs: Left Weakness of Arms/Hands: None  Permission Sought/Granted                  Emotional Assessment Appearance:: Appears stated age     Orientation: : Oriented to Self, Oriented to Place, Oriented to  Time, Oriented to Situation Alcohol / Substance Use: Not Applicable Psych Involvement: No (comment)  Admission diagnosis:  Total knee replacement status, left [Z96.652] Patient Active Problem List   Diagnosis Date Noted   Total knee replacement status, 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   Rotator cuff syndrome, right 01/11/2020   Labile blood glucose    Vascular headache    Small vessel disease, cerebrovascular    Prediabetes    AKI (acute kidney injury) (HCC)    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   Acute cerebrovascular accident (CVA) (HCC) 09/30/2019   Unilateral primary osteoarthritis, left knee 03/17/2019   Unilateral primary osteoarthritis, right knee 03/17/2019   Bilateral primary osteoarthritis of knee 03/10/2019   INSOMNIA, CHRONIC 05/19/2007  SPONDYLOSIS, LUMBAR 05/19/2007   Essential hypertension 05/03/2007   ARTHRITIS, KNEE 05/03/2007   PCP:  Burnice Logan, PA Pharmacy:   [facility] [phone] - [place], [place] - [address] AT [facility] [address] [place] [address] Phone: [phone] Fax: [phone]     Social Determinants of Health (SDOH) Social History: SDOH Screenings   Depression (PHQ2-9): Low Risk  (07/02/2022)  Tobacco Use: Medium Risk (12/27/2022)   SDOH Interventions:      Readmission Risk Interventions    12/28/2022    1:24 PM  Readmission Risk Prevention Plan  Post Dischage Appt Complete  Medication Screening Not Complete  Medication Screening Not Complete Comment N/A  Transportation Screening Not Complete  Transportation Screening Comment N/A

## 2022-12-28 NOTE — NC FL2 (Signed)
[place] MEDICAID FL2 LEVEL OF CARE FORM     IDENTIFICATION  Patient Name: Alison Whitehead Birthdate: 1950-11-23 Sex: female Admission Date (Current Location): 12/26/2022  [facility] and [place] Number:  Producer, television/film/video and Address:  [facility] [place], [address], [place], [place] [address]      Provider Number: [id number]  Attending Physician Name and Address:  Nadara Mustard, MD  Relative Name and Phone Number:       Current Level of Care: Hospital Recommended Level of Care: Skilled Nursing Facility Prior Approval Number:    Date Approved/Denied: 12/28/22 PASRR Number: [id number]  Discharge Plan: SNF    Current Diagnoses: Patient Active Problem List   Diagnosis Date Noted   Total knee replacement status, 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   Rotator cuff syndrome, right 01/11/2020   Labile blood glucose    Vascular headache    Small vessel disease, cerebrovascular    Prediabetes    AKI (acute kidney injury) (HCC)    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   Acute cerebrovascular accident (CVA) (HCC) 09/30/2019   Unilateral primary osteoarthritis, left knee 03/17/2019   Unilateral primary osteoarthritis, right knee 03/17/2019   Bilateral primary osteoarthritis of knee 03/10/2019   INSOMNIA, CHRONIC 05/19/2007   SPONDYLOSIS, LUMBAR 05/19/2007   Essential hypertension 05/03/2007   ARTHRITIS, KNEE 05/03/2007    Orientation RESPIRATION BLADDER Height & Weight     Self, Time, Situation, Place  Normal Continent Weight: 230 lb (104.3 kg) Height:  5\' 6"  (167.6 cm)  BEHAVIORAL SYMPTOMS/MOOD NEUROLOGICAL BOWEL NUTRITION STATUS      Continent    AMBULATORY STATUS COMMUNICATION OF NEEDS Skin   Limited Assist Verbally Normal                       Personal Care Assistance Level of Assistance  Bathing, Feeding, Dressing Bathing Assistance: Limited assistance Feeding assistance: Independent Dressing Assistance: Independent     Functional  Limitations Info  Sight, Hearing, Speech Sight Info: Adequate Hearing Info: Adequate Speech Info: Adequate    SPECIAL CARE FACTORS FREQUENCY                       Contractures Contractures Info: Not present    Additional Factors Info  Code Status, Allergies Code Status Info: Full Allergies Info: Penicillins           Current Medications (12/28/2022):  This is the current hospital active medication list Current Facility-Administered Medications  Medication Dose Route Frequency Provider Last Rate Last Admin   0.9 %  sodium chloride infusion   Intravenous Continuous Nadara Mustard, MD 10 mL/hr at 12/26/22 1523 New Bag at 12/26/22 1523   acetaminophen (TYLENOL) tablet 325-650 mg  325-650 mg Oral Q6H PRN Nadara Mustard, MD   650 mg at 12/27/22 0910   alum & mag hydroxide-simeth (MAALOX/MYLANTA) 200-200-20 MG/5ML suspension 30 mL  30 mL Oral Q4H PRN Nadara Mustard, MD       amLODipine (NORVASC) tablet 10 mg  10 mg Oral Daily Nadara Mustard, MD   10 mg at 12/28/22 7106   And   irbesartan (AVAPRO) tablet 150 mg  150 mg Oral Daily Nadara Mustard, MD   150 mg at 12/28/22 2694   aspirin EC tablet 325 mg  325 mg Oral Q breakfast Nadara Mustard, MD   325 mg at 12/28/22 8546   atorvastatin (LIPITOR) tablet 40 mg  40 mg Oral Daily Lajoyce Corners,  Randa Evens, MD   40 mg at 12/28/22 0951   bisacodyl (DULCOLAX) suppository 10 mg  10 mg Rectal Daily PRN Nadara Mustard, MD       carvedilol (COREG) tablet 25 mg  25 mg Oral BID Nadara Mustard, MD   25 mg at 12/28/22 1610   dapagliflozin propanediol (FARXIGA) tablet 10 mg  10 mg Oral Daily Nadara Mustard, MD   10 mg at 12/28/22 0951   diphenhydrAMINE (BENADRYL) 12.5 MG/5ML elixir 12.5-25 mg  12.5-25 mg Oral Q4H PRN Nadara Mustard, MD       docusate sodium (COLACE) capsule 100 mg  100 mg Oral BID Nadara Mustard, MD   100 mg at 12/28/22 9604   HYDROcodone-acetaminophen (NORCO) 7.5-325 MG per tablet 1-2 tablet  1-2 tablet Oral Q4H PRN Nadara Mustard, MD   2  tablet at 12/28/22 1220   HYDROmorphone (DILAUDID) injection 0.5-1 mg  0.5-1 mg Intravenous Q4H PRN Nadara Mustard, MD   1 mg at 12/27/22 2259   magnesium citrate solution 1 Bottle  1 Bottle Oral Once PRN Nadara Mustard, MD       menthol-cetylpyridinium (CEPACOL) lozenge 3 mg  1 lozenge Oral PRN Nadara Mustard, MD       Or   phenol (CHLORASEPTIC) mouth spray 1 spray  1 spray Mouth/Throat PRN Nadara Mustard, MD       metFORMIN (GLUCOPHAGE) tablet 500 mg  500 mg Oral Q supper Nadara Mustard, MD   500 mg at 12/27/22 1640   methocarbamol (ROBAXIN) tablet 500 mg  500 mg Oral Q6H PRN Nadara Mustard, MD   500 mg at 12/27/22 2035   Or   methocarbamol (ROBAXIN) 500 mg in dextrose 5 % 50 mL IVPB  500 mg Intravenous Q6H PRN Nadara Mustard, MD       metoCLOPramide (REGLAN) tablet 5-10 mg  5-10 mg Oral Q8H PRN Nadara Mustard, MD       Or   metoCLOPramide (REGLAN) injection 5-10 mg  5-10 mg Intravenous Q8H PRN Nadara Mustard, MD       ondansetron [facility]) tablet 4 mg  4 mg Oral Q6H PRN Nadara Mustard, MD       Or   ondansetron [facility]) injection 4 mg  4 mg Intravenous Q6H PRN Nadara Mustard, MD       pantoprazole (PROTONIX) EC tablet 40 mg  40 mg Oral Daily Nadara Mustard, MD   40 mg at 12/28/22 5409   polyethylene glycol (MIRALAX / GLYCOLAX) packet 17 g  17 g Oral Daily PRN Nadara Mustard, MD       zolpidem Remus Loffler) tablet 5 mg  5 mg Oral QHS PRN Nadara Mustard, MD   5 mg at 12/27/22 2035     Discharge Medications: Please see discharge summary for a list of discharge medications.  Relevant Imaging Results:  Relevant Lab Results:   Additional Information SSN # [id number]  Georgie Chard, LCSW

## 2022-12-28 NOTE — Plan of Care (Signed)
  Problem: Skin Integrity: Goal: Risk for impaired skin integrity will decrease Outcome: Not Progressing   Problem: Safety: Goal: Ability to remain free from injury will improve Outcome: Not Progressing   Problem: Pain Managment: Goal: General experience of comfort will improve Outcome: Not Progressing   Problem: Elimination: Goal: Will not experience complications related to bowel motility Outcome: Not Progressing   Problem: Activity: Goal: Risk for activity intolerance will decrease Outcome: Not Progressing

## 2022-12-29 LAB — GLUCOSE, CAPILLARY: Glucose-Capillary: 120 mg/dL — ABNORMAL HIGH (ref 70–99)

## 2022-12-29 NOTE — Progress Notes (Signed)
Physical Therapy Treatment Patient Details Name: Alison Whitehead MRN: [id number] DOB: 06-Sep-1950 Today's Date: 12/29/2022   History of Present Illness 72 y.o. female presents to [facility] hospital on 12/26/2022 for elective L TKA; Hospital course complicated by significant pain limiting AROM and activity tolerance. PMH includes CKD, DDD, HTN, IBS, HTN, osteoporosis, CVA.    PT Comments    Continuing work on functional mobility and activity tolerance;  Session focus was to discern a safe way to assist Ms. Cenci OOB to the recliner; she hasn't been OOB since her surgery, and prioritized OOB this session to combat the secondary complications of bedrest; Opted to use the L knee immobilizer to stabilize her knee during the transition, and second person was present to support her L foot and lower leg; Overall the Maximove was a safe and reliable way to assist her OOB to the recliner; recommend she be OOB at least once a day;   We are having a difficult time with getting her knee moving, getting quad and hamstring muscle action for active movement about her L knee, and especially with bearing weight L knee due to her significant pain; Do we have any different options for pain management?    Recommendations for follow up therapy are one component of a multi-disciplinary discharge planning process, led by the attending physician.  Recommendations may be updated based on patient status, additional functional criteria and insurance authorization.  Follow Up Recommendations  Can patient physically be transported by private vehicle: No    Assistance Recommended at Discharge Frequent or constant Supervision/Assistance  Patient can return home with the following A lot of help with walking and/or transfers;A lot of help with bathing/dressing/bathroom;Assistance with cooking/housework;Assist for transportation;Help with stairs or ramp for entrance   Equipment Recommendations  BSC/3in1    Recommendations  for Other Services       Precautions / Restrictions Precautions Precautions: Fall Required Braces or Orthoses: Knee Immobilizer - Left Restrictions LLE Weight Bearing: Weight bearing as tolerated Other Position/Activity Restrictions: Decr tolerance of movement or WBing L knee     Mobility  Bed Mobility Overal bed mobility: Needs Assistance Bed Mobility: Supine to Sit     Supine to sit: Mod assist, HOB elevated     General bed mobility comments: Noting good use of bedrail and UEs to scoot to EOB; mod assist to support LLE coming off of the bed, as well as handheld assist to pull to sit and scoot closer to EOB to allow L foot to rest on the floor    Transfers Overall transfer level: Needs assistance Equipment used: Ambulation equipment used Transfers: Bed to chair/wheelchair/BSC             General transfer comment: Placed Maximove sling with pt sitting EOB; Opted to use the knee immobilizer with the hope that stabilizing her L knee would decr pain during the transfer; second person present and supporting L foot throughout the transition bed to recliner Transfer via Lift Equipment: Maximove  Ambulation/Gait                   Stairs             Wheelchair Mobility    Modified Rankin (Stroke Patients Only)       Balance     Sitting balance-Leahy Scale: Fair (approaching good)  Cognition Arousal/Alertness: Awake/alert Behavior During Therapy: WFL for tasks assessed/performed Overall Cognitive Status: Within Functional Limits for tasks assessed                                          Exercises      General Comments General comments (skin integrity, edema, etc.): Discussed importance of being OOB to combat the effects of bedrest, decr risk of pneumonia and other complications      Pertinent Vitals/Pain Pain Assessment Pain Assessment: Faces Faces Pain Scale: Hurts whole  lot Pain Location: L knee with any motion Pain Descriptors / Indicators: Grimacing, Guarding (Wincing, gasping) Pain Intervention(s): Limited activity within patient's tolerance, Monitored during session    Home Living                          Prior Function            PT Goals (current goals can now be found in the care plan section) Acute Rehab PT Goals Patient Stated Goal: to go to short term rehab, return to independence PT Goal Formulation: With patient Time For Goal Achievement: 01/09/23 Potential to Achieve Goals: Fair Progress towards PT goals: Progressing toward goals (slowly)    Frequency    7X/week      PT Plan Current plan remains appropriate    Co-evaluation              AM-PAC PT "6 Clicks" Mobility   Outcome Measure  Help needed turning from your back to your side while in a flat bed without using bedrails?: A Lot Help needed moving from lying on your back to sitting on the side of a flat bed without using bedrails?: A Lot Help needed moving to and from a bed to a chair (including a wheelchair)?: Total Help needed standing up from a chair using your arms (e.g., wheelchair or bedside chair)?: Total Help needed to walk in hospital room?: Total Help needed climbing 3-5 steps with a railing? : Total 6 Click Score: 8    End of Session Equipment Utilized During Treatment: Other (comment) (bed pad) Activity Tolerance: Patient limited by pain Patient left: in chair;with call bell/phone within reach;with chair alarm set;with family/visitor present Nurse Communication: Mobility status PT Visit Diagnosis: Other abnormalities of gait and mobility (R26.89);Muscle weakness (generalized) (M62.81);Pain Pain - Right/Left: Left Pain - part of body: Knee     Time: 0926-1006 PT Time Calculation (min) (ACUTE ONLY): 40 min  Charges:  $Therapeutic Activity: 38-52 mins                     Van Clines, PT  Acute Rehabilitation Services Office  [phone] Secure Chat welcomed    Levi Aland 12/29/2022, 11:30 AM

## 2022-12-29 NOTE — TOC Progression Note (Addendum)
Transition of Care [facility]) - Progression Note    Patient Details  Name: Alison Whitehead MRN: [id number] Date of Birth: Jan 21, 1951  Transition of Care [facility]) CM/SW Contact  Carmina Miller, LCSWA Phone Number: 12/29/2022, 1:01 PM  Clinical Narrative:     Bed offers provided to pt, she requests time to review the offers.   Expected Discharge Plan: Skilled Nursing Facility Barriers to Discharge: No Barriers Identified  Expected Discharge Plan and Services       Living arrangements for the past 2 months: Single Family Home                                       Social Determinants of Health (SDOH) Interventions SDOH Screenings   Depression (PHQ2-9): Low Risk  (07/02/2022)  Tobacco Use: Medium Risk (12/27/2022)    Readmission Risk Interventions    12/28/2022    1:24 PM  Readmission Risk Prevention Plan  Post Dischage Appt Complete  Medication Screening Not Complete  Medication Screening Not Complete Comment N/A  Transportation Screening Not Complete  Transportation Screening Comment N/A

## 2022-12-29 NOTE — Progress Notes (Signed)
Patient ID: Alison Whitehead, female   DOB: July 15, 1951, 72 y.o.   MRN: [id number] Patient worked with PT to mobilize. No events overnight. She has been icing the knee regularly and feels that is helping. She understands plan for SNF and has selected a facility pending bed offers.  EHL/TA/GSC intact. SILT in s/s/dp/sp/t nerve distributions. Foot warm and well perfused.   Weight bear as tolerated LLE. PT evaluate and treat. Pain control. Patient will need discharge to short-term skilled nursing.

## 2022-12-30 MED ORDER — HYDROCODONE-ACETAMINOPHEN 10-325 MG PO TABS: 1.0000 | ORAL_TABLET | ORAL | 0 refills | Status: DC | PRN

## 2022-12-30 NOTE — Progress Notes (Signed)
Physical Therapy Treatment Patient Details Name: Alison Whitehead MRN: [id number] DOB: April 08, 1951 Today's Date: 12/30/2022   History of Present Illness 72 y.o. female presents to [facility] hospital on 12/26/2022 for elective L TKA; Hospital course complicated by significant pain limiting AROM and activity tolerance. PMH includes CKD, DDD, HTN, IBS, HTN, osteoporosis, CVA.    PT Comments    Patient reports she is very uncomfortable and would like to sit up. Encouraged patient to attempt standing or to pivot to recliner, but she declines. Patient requires mod A with bed mobility and heavy use of B UEs sitting edge of bed with limited tolerance for scooting while in sitting. Patient tolerates minimal movement of L LE due to pain. She will continue to benefit from skilled PT to improve functional independence, strength and ROM.     Recommendations for follow up therapy are one component of a multi-disciplinary discharge planning process, led by the attending physician.  Recommendations may be updated based on patient status, additional functional criteria and insurance authorization.  Follow Up Recommendations  Can patient physically be transported by private vehicle: No    Assistance Recommended at Discharge Frequent or constant Supervision/Assistance  Patient can return home with the following Two people to help with walking and/or transfers;Two people to help with bathing/dressing/bathroom   Equipment Recommendations  Other (comment) (TBD)    Recommendations for Other Services       Precautions / Restrictions Precautions Precautions: Fall Restrictions Weight Bearing Restrictions: Yes LLE Weight Bearing: Weight bearing as tolerated Other Position/Activity Restrictions: Decr tolerance of movement or WBing L knee     Mobility  Bed Mobility Overal bed mobility: Needs Assistance Bed Mobility: Supine to Sit, Sit to Supine Rolling: Max assist   Supine to sit: Mod assist, HOB  elevated Sit to supine: Min assist   General bed mobility comments: Patient does better after received pain medicine. Continues to have poor tolerance, but able to get seated on edge of bed with mod A    Transfers                   General transfer comment: declined    Ambulation/Gait               General Gait Details: unable   Stairs             Wheelchair Mobility    Modified Rankin (Stroke Patients Only)       Balance Overall balance assessment: Needs assistance, Modified Independent Sitting-balance support: Bilateral upper extremity supported Sitting balance-Leahy Scale: Fair Sitting balance - Comments: pain limited                                    Cognition Arousal/Alertness: Awake/alert Behavior During Therapy: WFL for tasks assessed/performed Overall Cognitive Status: Within Functional Limits for tasks assessed                                 General Comments: self limiting due to extreme pain with any movement of L LE        Exercises Total Joint Exercises Ankle Circles/Pumps: 10 reps, AAROM, Left Other Exercises Other Exercises: patient with limited tolerance for exercise. Performed AP, IR/ER rotation as she tends to keep leg in ER. Mini knee flexion/extension in sitting    General Comments        Pertinent  Vitals/Pain Pain Assessment Pain Assessment: Faces Faces Pain Scale: Hurts whole lot Pain Location: L knee with any motion Pain Descriptors / Indicators: Grimacing, Guarding, Moaning Pain Intervention(s): Monitored during session, Premedicated before session, Repositioned, Ice applied    Home Living                          Prior Function            PT Goals (current goals can now be found in the care plan section) Acute Rehab PT Goals Patient Stated Goal: to go to short term rehab, return to independence PT Goal Formulation: With patient Time For Goal Achievement:  01/09/23 Potential to Achieve Goals: Fair Progress towards PT goals: Progressing toward goals    Frequency    7X/week      PT Plan Current plan remains appropriate    Co-evaluation              AM-PAC PT "6 Clicks" Mobility   Outcome Measure  Help needed turning from your back to your side while in a flat bed without using bedrails?: A Lot Help needed moving from lying on your back to sitting on the side of a flat bed without using bedrails?: A Lot Help needed moving to and from a bed to a chair (including a wheelchair)?: Total Help needed standing up from a chair using your arms (e.g., wheelchair or bedside chair)?: Total Help needed to walk in hospital room?: Total Help needed climbing 3-5 steps with a railing? : Total 6 Click Score: 8    End of Session   Activity Tolerance: Patient limited by pain Patient left: in bed;with bed alarm set;with call bell/phone within reach Nurse Communication: Mobility status PT Visit Diagnosis: Pain;Other abnormalities of gait and mobility (R26.89) Pain - Right/Left: Left Pain - part of body: Knee     Time: 1430-1441 PT Time Calculation (min) (ACUTE ONLY): 11 min  Charges:  $Therapeutic Activity: 8-22 mins                     Brenda Cowher, PT, GCS 12/30/22,2:48 PM

## 2022-12-30 NOTE — TOC Progression Note (Signed)
Transition of Care [facility]) - Progression Note    Patient Details  Name: Alison Whitehead MRN: [id number] Date of Birth: [birth year]  Transition of [facility]) CM/SW Contact  Lorri Frederick, LCSW Phone Number: 12/30/2022, 11:18 AM  Clinical Narrative:   CSW spoke with pt regarding SNF bed offers, she wants to accept offer at [facility], requesting private room.  CSW spoke with [facility], she does have private room available.  Will start insurance auth today.     Expected Discharge Plan: Skilled Nursing Facility Barriers to Discharge: No Barriers Identified  Expected Discharge Plan and Services       Living arrangements for the past 2 months: Single Family Home Expected Discharge Date: 12/30/22                                     Social Determinants of Health (SDOH) Interventions SDOH Screenings   Depression (PHQ2-9): Low Risk  (07/02/2022)  Tobacco Use: Medium Risk (12/27/2022)    Readmission Risk Interventions    12/28/2022    1:24 PM  Readmission Risk Prevention Plan  Post Dischage Appt Complete  Medication Screening Not Complete  Medication Screening Not Complete Comment N/A  Transportation Screening Not Complete  Transportation Screening Comment N/A

## 2022-12-30 NOTE — Plan of Care (Signed)
  Problem: Education: Goal: Knowledge of General Education information will improve Description: Including pain rating scale, medication(s)/side effects and non-pharmacologic comfort measures Outcome: Progressing   Problem: Health Behavior/Discharge Planning: Goal: Ability to manage health-related needs will improve Outcome: Progressing   Problem: Activity: Goal: Risk for activity intolerance will decrease Outcome: Progressing   

## 2022-12-30 NOTE — Discharge Summary (Signed)
Discharge Diagnoses:  Principal Problem:   Total knee replacement status, left   Surgeries: Procedure(s): LEFT TOTAL KNEE ARTHROPLASTY on 12/26/2022    Consultants:   Discharged Condition: Improved  Hospital Course: Alison Whitehead is an 72 y.o. female who was admitted 12/26/2022 with a chief complaint of osteoarthritis left knee, with a final diagnosis of Osteoarthritis Left Knee.  Patient was brought to the operating room on 12/26/2022 and underwent Procedure(s): LEFT TOTAL KNEE ARTHROPLASTY.    Patient was given perioperative antibiotics:  Anti-infectives (From admission, onward)    Start     Dose/Rate Route Frequency Ordered Stop   12/26/22 1430  vancomycin (VANCOREADY) IVPB 1500 mg/300 mL        1,500 mg 150 mL/hr over 120 Minutes Intravenous Every 12 hours 12/26/22 1334 12/26/22 1725   12/26/22 1415  vancomycin (VANCOCIN) IVPB 1000 mg/200 mL premix  Status:  Discontinued        1,000 mg 200 mL/hr over 60 Minutes Intravenous Every 12 hours 12/26/22 1315 12/26/22 1334   12/26/22 0820  vancomycin (VANCOCIN) 1-5 GM/200ML-% IVPB       Note to Pharmacy: Patsi Sears E: cabinet override      12/26/22 0820 12/26/22 2029   12/26/22 0700  ceFAZolin (ANCEF) IVPB 2g/100 mL premix        2 g 200 mL/hr over 30 Minutes Intravenous On call to O.R. 12/26/22 1610 12/26/22 0929     .  Patient was given sequential compression devices, early ambulation, and aspirin for DVT prophylaxis.  Recent vital signs: Patient Vitals for the past 24 hrs:  BP Temp Temp src Pulse Resp SpO2  12/30/22 0111 98/66 -- -- -- -- --  12/29/22 2005 106/75 98.4 F (36.9 C) -- 87 17 92 %  12/29/22 1850 106/80 -- -- -- -- --  12/29/22 1345 98/71 98.7 F (37.1 C) Oral 88 18 92 %  12/29/22 0742 105/82 98.9 F (37.2 C) Oral 100 18 91 %  .  Recent laboratory studies: No results found.  Discharge Medications:   Allergies as of 12/30/2022       Reactions   Penicillins Itching        Medication List      STOP taking these medications    acetaminophen 500 MG tablet Commonly known as: TYLENOL   albuterol 108 (90 Base) MCG/ACT inhaler Commonly known as: ProAir HFA   cetirizine 10 MG tablet Commonly known as: ZYRTEC   diclofenac sodium 1 % Gel Commonly known as: VOLTAREN   lisinopril 20 MG tablet Commonly known as: ZESTRIL   metoprolol succinate 25 MG 24 hr tablet Commonly known as: TOPROL-XL   Muscle Rub 10-15 % Crea       TAKE these medications    amLODipine-olmesartan 10-40 MG tablet Commonly known as: AZOR Take 1 tablet by mouth daily.   aspirin EC 81 MG tablet Take 1 tablet (81 mg total) by mouth daily.   atorvastatin 40 MG tablet Commonly known as: LIPITOR Take 1 tablet (40 mg total) by mouth daily.   carvedilol 25 MG tablet Commonly known as: COREG Take 25 mg by mouth 2 (two) times daily. What changed: Another medication with the same name was removed. Continue taking this medication, and follow the directions you see here.   Creon 36000 UNITS Cpep capsule Generic drug: lipase/protease/amylase Take 36,000-72,000 Units by mouth See admin instructions. Take 72,000 units by mouth daily with a meal. Take 36,000 units by mouth daily with a snack.   dapagliflozin propanediol  10 MG Tabs tablet Commonly known as: FARXIGA Take 1 tablet (10 mg total) by mouth daily.   dicyclomine 10 MG capsule Commonly known as: BENTYL Take 1 to 2 Capsules by mouth four times daily, as needed, FOR IBS What changed:  how much to take how to take this when to take this reasons to take this   diphenhydrAMINE 25 mg capsule Commonly known as: BENADRYL Take 25 mg by mouth daily.   ferrous sulfate 325 (65 FE) MG tablet Take 325 mg by mouth 3 (three) times a week.   HYDROcodone-acetaminophen 10-325 MG tablet Commonly known as: NORCO Take 1 tablet by mouth every 4 (four) hours as needed for moderate pain. What changed:  when to take this reasons to take this    Melatonin 5 MG Caps Take 5 mg by mouth at bedtime.   metFORMIN 500 MG tablet Commonly known as: GLUCOPHAGE Take 500 mg by mouth daily.   naloxone 4 MG/0.1ML Liqd nasal spray kit Commonly known as: NARCAN SMARTSIG:Both Nares   Pfizer-BioNT COVID-19 Vac-TriS Susp injection Generic drug: COVID-19 mRNA Vac-TriS (Pfizer) Inject into the muscle.   phentermine 30 MG capsule Take 30 mg by mouth daily.   Rybelsus 14 MG Tabs Generic drug: Semaglutide Take 14 mg by mouth every evening.   torsemide 5 MG tablet Commonly known as: DEMADEX Take 5 mg by mouth daily.        Diagnostic Studies: No results found.  Patient benefited maximally from their hospital stay and there were no complications.     Disposition: Discharge disposition: [facility]-Rehab Facility      Discharge Instructions     Call MD / Call 911   Complete by: As directed    If you experience chest pain or shortness of breath, CALL 911 and be transported to the hospital emergency room.  If you develope a fever above 101 F, pus (white drainage) or increased drainage or redness at the wound, or calf pain, call your surgeon's office.   Constipation Prevention   Complete by: As directed    Drink plenty of fluids.  Prune juice may be helpful.  You may use a stool softener, such as Colace (over the counter) 100 mg twice a day.  Use MiraLax (over the counter) for constipation as needed.   Diet - low sodium heart healthy   Complete by: As directed    Increase activity slowly as tolerated   Complete by: As directed    Post-operative opioid taper instructions:   Complete by: As directed    POST-OPERATIVE OPIOID TAPER INSTRUCTIONS: It is important to wean off of your opioid medication as soon as possible. If you do not need pain medication after your surgery it is ok to stop day one. Opioids include: Codeine, Hydrocodone(Norco, Vicodin), Oxycodone(Percocet, oxycontin) and hydromorphone amongst others.  Long term and even  short term use of opiods can cause: Increased pain response Dependence Constipation Depression Respiratory depression And more.  Withdrawal symptoms can include Flu like symptoms Nausea, vomiting And more Techniques to manage these symptoms Hydrate well Eat regular healthy meals Stay active Use relaxation techniques(deep breathing, meditating, yoga) Do Not substitute Alcohol to help with tapering If you have been on opioids for less than two weeks and do not have pain than it is ok to stop all together.  Plan to wean off of opioids This plan should start within one week post op of your joint replacement. Maintain the same interval or time between taking each dose and first  decrease the dose.  Cut the total daily intake of opioids by one tablet each day Next start to increase the time between doses. The last dose that should be eliminated is the evening dose.          Follow-up Information     Nadara Mustard, MD Follow up in 1 week(s).   Specialty: Orthopedic Surgery Contact information: [address] [place] [address] [phone]                  Signed: Nadara Mustard 12/30/2022, 6:51 AM

## 2022-12-30 NOTE — Progress Notes (Signed)
Physical Therapy Treatment Patient Details Name: Alison Whitehead MRN: [id number] DOB: 06-21-1951 Today's Date: 12/30/2022   History of Present Illness 72 y.o. female presents to [facility] on 12/26/2022 for elective L TKA; Hospital course complicated by significant pain limiting AROM and activity tolerance. PMH includes CKD, DDD, HTN, IBS, HTN, osteoporosis, CVA.    PT Comments    Patient received in bed, initially declining all PT.  States she isn't feeling well, having pain. Encouraged patient to at least move her left leg in bed. Polar care found to be warm wrapped around L knee. Added ice to machine and re-applied. Patient also requesting pain medicine. She yells out with the slightest movement of L LE including ankle pumps. Poor tolerance for therapy and overall mobility due to poor pain tolerance. She will continue to benefit from skilled PT to improve mobility, strength and ROM of L LE.         Recommendations for follow up therapy are one component of a multi-disciplinary discharge planning process, led by the attending physician.  Recommendations may be updated based on patient status, additional functional criteria and insurance authorization.  Follow Up Recommendations       Assistance Recommended at Discharge Frequent or constant Supervision/Assistance  Patient can return home with the following Two people to help with walking and/or transfers;Two people to help with bathing/dressing/bathroom   Equipment Recommendations  None recommended by PT;Other (comment) (TBD next venue)    Recommendations for Other Services       Precautions / Restrictions Precautions Precautions: Fall Restrictions Weight Bearing Restrictions: Yes LLE Weight Bearing: Weight bearing as tolerated Other Position/Activity Restrictions: Decr tolerance of movement or WBing L knee     Mobility  Bed Mobility Overal bed mobility: Needs Assistance Bed Mobility: Rolling, Supine to Sit Rolling: Max  assist   Supine to sit: Max assist, HOB elevated     General bed mobility comments: Patient limited by pain. Yelling out with minimal movement of L LE. Even doing ankle pumps.    Transfers                   General transfer comment: Not attempted due to high level of pain    Ambulation/Gait               General Gait Details: unable   Stairs             Wheelchair Mobility    Modified Rankin (Stroke Patients Only)       Balance                                            Cognition Arousal/Alertness: Awake/alert Behavior During Therapy: WFL for tasks assessed/performed Overall Cognitive Status: Within Functional Limits for tasks assessed                                 General Comments: self limiting due to extreme pain with any movement of L LE        Exercises Total Joint Exercises Ankle Circles/Pumps: AAROM, Left, 10 reps    General Comments        Pertinent Vitals/Pain Pain Assessment Pain Assessment: Faces Faces Pain Scale: Hurts worst Pain Location: L knee with any motion Pain Descriptors / Indicators: Grimacing, Guarding, Moaning Pain Intervention(s): Ice applied, Monitored during  session, Repositioned, Patient requesting pain meds-RN notified    Home Living                          Prior Function            PT Goals (current goals can now be found in the care plan section) Acute Rehab PT Goals Patient Stated Goal: to go to short term rehab, return to independence PT Goal Formulation: With patient Time For Goal Achievement: 01/09/23 Potential to Achieve Goals: Poor Progress towards PT goals: Not progressing toward goals - comment (pain limited)    Frequency    7X/week      PT Plan Current plan remains appropriate    Co-evaluation              AM-PAC PT "6 Clicks" Mobility   Outcome Measure  Help needed turning from your back to your side while in a flat bed  without using bedrails?: A Lot Help needed moving from lying on your back to sitting on the side of a flat bed without using bedrails?: A Lot Help needed moving to and from a bed to a chair (including a wheelchair)?: Total Help needed standing up from a chair using your arms (e.g., wheelchair or bedside chair)?: Total Help needed to walk in hospital room?: Total Help needed climbing 3-5 steps with a railing? : Total 6 Click Score: 8    End of Session   Activity Tolerance: Patient limited by pain Patient left: in bed Nurse Communication: Mobility status PT Visit Diagnosis: Pain;Other abnormalities of gait and mobility (R26.89) Pain - Right/Left: Left Pain - part of body: Knee     Time: 1250-1305 PT Time Calculation (min) (ACUTE ONLY): 15 min  Charges:  $Therapeutic Activity: 8-22 mins                     Keimon Basaldua, PT, GCS 12/30/22,1:12 PM

## 2022-12-30 NOTE — Progress Notes (Signed)
Patient ID: Alison Whitehead, female   DOB: 05-12-1951, 72 y.o.   MRN: [id number] Plan for discharge to SNF today. Patient does not need immobilizer, Rx on the chart

## 2022-12-30 NOTE — Care Management Important Message (Signed)
Important Message  Patient Details  Name: Alison Whitehead MRN: [id number] Date of Birth: [birth year]   Medicare Important Message Given:  Yes     [name] 12/30/2022, 4:27 PM

## 2022-12-31 NOTE — Progress Notes (Addendum)
Patient ID: Alison Whitehead, female   DOB: [birth year], 72 y.o.   MRN: [id number] Patient has no complaints this morning she is making slow progress with therapy.  Plan for discharge to skilled nursing when insurance authorization obtained.  Patient with BMI 37, additional diagnosis: Obesity.

## 2022-12-31 NOTE — Discharge Summary (Signed)
Discharge Diagnoses:  Principal Problem:   Total knee replacement status, left   Surgeries: Procedure(s): LEFT TOTAL KNEE ARTHROPLASTY on 12/26/2022    Consultants:   Discharged Condition: Improved  Hospital Course: Alison Whitehead is an 72 y.o. female who was admitted 12/26/2022 with a chief complaint of osteoarthritis left knee, with a final diagnosis of Osteoarthritis Left Knee.  Patient was brought to the operating room on 12/26/2022 and underwent Procedure(s): LEFT TOTAL KNEE ARTHROPLASTY.    Patient was given perioperative antibiotics:  Anti-infectives (From admission, onward)    Start     Dose/Rate Route Frequency Ordered Stop   12/26/22 1430  vancomycin (VANCOREADY) IVPB 1500 mg/300 mL        1,500 mg 150 mL/hr over 120 Minutes Intravenous Every 12 hours 12/26/22 1334 12/26/22 1725   12/26/22 1415  vancomycin (VANCOCIN) IVPB 1000 mg/200 mL premix  Status:  Discontinued        1,000 mg 200 mL/hr over 60 Minutes Intravenous Every 12 hours 12/26/22 1315 12/26/22 1334   12/26/22 0820  vancomycin (VANCOCIN) 1-5 GM/200ML-% IVPB       Note to Pharmacy: Patsi Sears E: cabinet override      12/26/22 0820 12/26/22 2029   12/26/22 0700  ceFAZolin (ANCEF) IVPB 2g/100 mL premix        2 g 200 mL/hr over 30 Minutes Intravenous On call to O.R. 12/26/22 1610 12/26/22 0929     .  Patient was given sequential compression devices, early ambulation, and aspirin for DVT prophylaxis.  Recent vital signs: Patient Vitals for the past 24 hrs:  BP Temp Temp src Pulse Resp SpO2  12/31/22 1303 112/76 99 F (37.2 C) Oral 78 18 93 %  12/31/22 0753 128/84 98.8 F (37.1 C) Oral 87 18 94 %  12/31/22 0452 108/79 98.6 F (37 C) -- 83 17 93 %  12/30/22 1930 117/80 98.2 F (36.8 C) -- 82 17 91 %  .  Recent laboratory studies: No results found.  Discharge Medications:   Allergies as of 12/31/2022       Reactions   Penicillins Itching        Medication List     STOP taking  these medications    acetaminophen 500 MG tablet Commonly known as: TYLENOL   albuterol 108 (90 Base) MCG/ACT inhaler Commonly known as: ProAir HFA   cetirizine 10 MG tablet Commonly known as: ZYRTEC   diclofenac sodium 1 % Gel Commonly known as: VOLTAREN   lisinopril 20 MG tablet Commonly known as: ZESTRIL   metoprolol succinate 25 MG 24 hr tablet Commonly known as: TOPROL-XL   Muscle Rub 10-15 % Crea       TAKE these medications    amLODipine-olmesartan 10-40 MG tablet Commonly known as: AZOR Take 1 tablet by mouth daily.   aspirin EC 81 MG tablet Take 1 tablet (81 mg total) by mouth daily.   atorvastatin 40 MG tablet Commonly known as: LIPITOR Take 1 tablet (40 mg total) by mouth daily.   carvedilol 25 MG tablet Commonly known as: COREG Take 25 mg by mouth 2 (two) times daily. What changed: Another medication with the same name was removed. Continue taking this medication, and follow the directions you see here.   Creon 36000 UNITS Cpep capsule Generic drug: lipase/protease/amylase Take 36,000-72,000 Units by mouth See admin instructions. Take 72,000 units by mouth daily with a meal. Take 36,000 units by mouth daily with a snack.   dapagliflozin propanediol 10 MG Tabs tablet Commonly  known as: FARXIGA Take 1 tablet (10 mg total) by mouth daily.   dicyclomine 10 MG capsule Commonly known as: BENTYL Take 1 to 2 Capsules by mouth four times daily, as needed, FOR IBS What changed:  how much to take how to take this when to take this reasons to take this   diphenhydrAMINE 25 mg capsule Commonly known as: BENADRYL Take 25 mg by mouth daily.   ferrous sulfate 325 (65 FE) MG tablet Take 325 mg by mouth 3 (three) times a week.   HYDROcodone-acetaminophen 10-325 MG tablet Commonly known as: NORCO Take 1 tablet by mouth every 4 (four) hours as needed for moderate pain. What changed:  when to take this reasons to take this   Melatonin 5 MG Caps Take 5  mg by mouth at bedtime.   metFORMIN 500 MG tablet Commonly known as: GLUCOPHAGE Take 500 mg by mouth daily.   naloxone 4 MG/0.1ML Liqd nasal spray kit Commonly known as: NARCAN SMARTSIG:Both Nares   Pfizer-BioNT COVID-19 Vac-TriS Susp injection Generic drug: COVID-19 mRNA Vac-TriS (Pfizer) Inject into the muscle.   phentermine 30 MG capsule Take 30 mg by mouth daily.   Rybelsus 14 MG Tabs Generic drug: Semaglutide Take 14 mg by mouth every evening.   torsemide 5 MG tablet Commonly known as: DEMADEX Take 5 mg by mouth daily.        Diagnostic Studies: No results found.  Patient benefited maximally from their hospital stay and there were no complications.     Disposition: Discharge disposition: 62-Rehab Facility      Discharge Instructions     Call MD / Call 911   Complete by: As directed    If you experience chest pain or shortness of breath, CALL 911 and be transported to the hospital emergency room.  If you develope a fever above 101 F, pus (white drainage) or increased drainage or redness at the wound, or calf pain, call your surgeon's office.   Call MD / Call 911   Complete by: As directed    If you experience chest pain or shortness of breath, CALL 911 and be transported to the hospital emergency room.  If you develope a fever above 101 F, pus (white drainage) or increased drainage or redness at the wound, or calf pain, call your surgeon's office.   Constipation Prevention   Complete by: As directed    Drink plenty of fluids.  Prune juice may be helpful.  You may use a stool softener, such as Colace (over the counter) 100 mg twice a day.  Use MiraLax (over the counter) for constipation as needed.   Constipation Prevention   Complete by: As directed    Drink plenty of fluids.  Prune juice may be helpful.  You may use a stool softener, such as Colace (over the counter) 100 mg twice a day.  Use MiraLax (over the counter) for constipation as needed.   Diet - low  sodium heart healthy   Complete by: As directed    Diet - low sodium heart healthy   Complete by: As directed    Increase activity slowly as tolerated   Complete by: As directed    Increase activity slowly as tolerated   Complete by: As directed    Post-operative opioid taper instructions:   Complete by: As directed    POST-OPERATIVE OPIOID TAPER INSTRUCTIONS: It is important to wean off of your opioid medication as soon as possible. If you do not need pain medication after  your surgery it is ok to stop day one. Opioids include: Codeine, Hydrocodone(Norco, Vicodin), Oxycodone(Percocet, oxycontin) and hydromorphone amongst others.  Long term and even short term use of opiods can cause: Increased pain response Dependence Constipation Depression Respiratory depression And more.  Withdrawal symptoms can include Flu like symptoms Nausea, vomiting And more Techniques to manage these symptoms Hydrate well Eat regular healthy meals Stay active Use relaxation techniques(deep breathing, meditating, yoga) Do Not substitute Alcohol to help with tapering If you have been on opioids for less than two weeks and do not have pain than it is ok to stop all together.  Plan to wean off of opioids This plan should start within one week post op of your joint replacement. Maintain the same interval or time between taking each dose and first decrease the dose.  Cut the total daily intake of opioids by one tablet each day Next start to increase the time between doses. The last dose that should be eliminated is the evening dose.      Post-operative opioid taper instructions:   Complete by: As directed    POST-OPERATIVE OPIOID TAPER INSTRUCTIONS: It is important to wean off of your opioid medication as soon as possible. If you do not need pain medication after your surgery it is ok to stop day one. Opioids include: Codeine, Hydrocodone(Norco, Vicodin), Oxycodone(Percocet, oxycontin) and  hydromorphone amongst others.  Long term and even short term use of opiods can cause: Increased pain response Dependence Constipation Depression Respiratory depression And more.  Withdrawal symptoms can include Flu like symptoms Nausea, vomiting And more Techniques to manage these symptoms Hydrate well Eat regular healthy meals Stay active Use relaxation techniques(deep breathing, meditating, yoga) Do Not substitute Alcohol to help with tapering If you have been on opioids for less than two weeks and do not have pain than it is ok to stop all together.  Plan to wean off of opioids This plan should start within one week post op of your joint replacement. Maintain the same interval or time between taking each dose and first decrease the dose.  Cut the total daily intake of opioids by one tablet each day Next start to increase the time between doses. The last dose that should be eliminated is the evening dose.          Follow-up Information     Nadara Mustard, MD Follow up in 1 week(s).   Specialty: Orthopedic Surgery Contact information: [address] [place] [address] [phone]                  Signed: Nadara Mustard 12/31/2022, 3:33 PM

## 2022-12-31 NOTE — Progress Notes (Signed)
Mobility Specialist Progress Note   12/31/22 1624  Mobility  Activity Transferred from chair to bed  Level of Assistance +2 (takes two people)  Assistive Device MaxiMove  Range of Motion/Exercises Active  LLE Weight Bearing NWB  Activity Response Tolerated fair   Patient received in recliner requesting assistance back to bed. Required +2 TA to transfer back to bed via maximove. Limited by significant LLE pain. Tolerated without incident. Was left in supine with all needs met, call bell in reach.   [place] Lenita Peregrina, BS EXP Mobility Specialist Please contact via SecureChat or Rehab office at [phone]

## 2022-12-31 NOTE — Progress Notes (Signed)
Attempted to call report again, no response.

## 2022-12-31 NOTE — Progress Notes (Signed)
Attempted to call report on pt to [facility], no answer. Will attempt to call back one more time before PT[facility] arrival. IV removed and pt has all pt belongings in her possession.

## 2022-12-31 NOTE — TOC Progression Note (Addendum)
Transition of Care [facility]) - Progression Note    Patient Details  Name: Leonah Stobbs MRN: [id number] Date of Birth: 04-Apr-1951  Transition of Care [facility]) CM/SW Contact  Lorri Frederick, LCSW Phone Number: 12/31/2022, 1:18 PM  Clinical Narrative:   Message from [facility].  Insurance Berkley Harvey remains pending.    Pt updated.    1520: Message from [facility].  Insurance auth approved, can accept today.  MD notified, Pt and RN notified.  Expected Discharge Plan: Skilled Nursing Facility Barriers to Discharge: No Barriers Identified  Expected Discharge Plan and Services       Living arrangements for the past 2 months: Single Family Home Expected Discharge Date: 12/30/22                                     Social Determinants of Health (SDOH) Interventions SDOH Screenings   Depression (PHQ2-9): Low Risk  (07/02/2022)  Tobacco Use: Medium Risk (12/27/2022)    Readmission Risk Interventions    12/28/2022    1:24 PM  Readmission Risk Prevention Plan  Post Dischage Appt Complete  Medication Screening Not Complete  Medication Screening Not Complete Comment N/A  Transportation Screening Not Complete  Transportation Screening Comment N/A

## 2022-12-31 NOTE — TOC Transition Note (Signed)
Transition of Care [facility]) - CM/SW Discharge Note   Patient Details  Name: Alison Whitehead MRN: [id number] Date of Birth: [birth year]  Transition of [facility]) CM/SW Contact:  Lorri Frederick, LCSW Phone Number: 12/31/2022, 3:54 PM   Clinical Narrative:   Pt discharging to [facility].  RN call report to [phone].      Final next level of care: Skilled Nursing Facility Barriers to Discharge: Barriers Resolved   Patient Goals and CMS Choice CMS Medicare.gov Compare Post Acute Care list provided to:: Patient    Discharge Placement                Patient chooses bed at:  [facility]) Patient to be transferred to facility by: [facility] Name of family member notified: LM with daughter [name], spoke with sister [name] Patient and family notified of of transfer: 12/31/22  Discharge Plan and Services Additional resources added to the After Visit Summary for                                       Social Determinants of Health (SDOH) Interventions SDOH Screenings   Depression (PHQ2-9): Low Risk  (07/02/2022)  Tobacco Use: Medium Risk (12/27/2022)     Readmission Risk Interventions    12/28/2022    1:24 PM  Readmission Risk Prevention Plan  Post Dischage Appt Complete  Medication Screening Not Complete  Medication Screening Not Complete Comment N/A  Transportation Screening Not Complete  Transportation Screening Comment N/A

## 2022-12-31 NOTE — Progress Notes (Signed)
Physical Therapy Treatment Patient Details Name: Alison Whitehead MRN: [id number] DOB: 1950/09/19 Today's Date: 12/31/2022   History of Present Illness 72 y.o. female presents to [facility] hospital on 12/26/2022 for elective L TKA; Hospital course complicated by significant pain limiting AROM and activity tolerance. PMH includes CKD, DDD, HTN, IBS, HTN, osteoporosis, CVA.    PT Comments    Continuing work on functional mobility and activity tolerance;  session focused on continuing efforts to assist pt OOB to the recliner safely and consistently;  assisted pt to EOB with mod assist; noted able to tolerate small AA movements into flexion and extension with foot sliding on the floor; Better ability to R and L lateral lean in sitting EOB as Maximove pad was poistioned; During transfer to chair, noted pt had stooled, and moved back to the bed, cleaned up, and then lifted pt with the Maximove back to the recliner;   Extremely slow progress, limited by pain -- I wonder if she'll be able to participate better doing a task she finds meaningful -- consider asking nursing to help her OOB with the Maximove earlier tomorrow, and later in the day, PT with 2 person assist to help her back to bed via lateral scoot (the recliner is a drop-arm);   Continue to recommend post-acute rehab to maximize independence and safety with mobility and ADLs in prep for dc home; We can't lose sight that she was modified independent prior to this elective TKA  Recommendations for follow up therapy are one component of a multi-disciplinary discharge planning process, led by the attending physician.  Recommendations may be updated based on patient status, additional functional criteria and insurance authorization.  Follow Up Recommendations  Can patient physically be transported by private vehicle: No    Assistance Recommended at Discharge Frequent or constant Supervision/Assistance  Patient can return home with the following Two  people to help with walking and/or transfers;Two people to help with bathing/dressing/bathroom   Equipment Recommendations  Other (comment) (to be determined at post-acute rehab)    Recommendations for Other Services       Precautions / Restrictions Precautions Precautions: Fall Restrictions LLE Weight Bearing: Weight bearing as tolerated Other Position/Activity Restrictions: Significantly Decr tolerance of movement or WBing L knee     Mobility  Bed Mobility Overal bed mobility: Needs Assistance Bed Mobility: Rolling, Supine to Sit Rolling: Mod assist   Supine to sit: Mod assist, HOB elevated     General bed mobility comments: Patient does better after received pain medicine. Continues to have poor tolerance, but able to get seated on edge of bed with mod A    Transfers Overall transfer level: Needs assistance Equipment used: Ambulation equipment used Transfers: Bed to chair/wheelchair/BSC             General transfer comment: Initiated first Maximove transfer from sitting EOB; mid-transfer, noted pt had stooled, so assisted back to bed, cleaned up, and then transferred bed to chiar with [employer] Transfer via Lift Equipment: Maximove  Ambulation/Gait                   Stairs             Wheelchair Mobility    Modified Rankin (Stroke Patients Only)       Balance     Sitting balance-Leahy Scale: Fair  Cognition Arousal/Alertness: Awake/alert Behavior During Therapy: WFL for tasks assessed/performed Overall Cognitive Status: Within Functional Limits for tasks assessed                                 General Comments: self limiting due to extreme pain with any movement of L LE        Exercises      General Comments General comments (skin integrity, edema, etc.): Discussed importance of being OOB to combat the effects of bedrest, decr risk of pneumonia and other  complications      Pertinent Vitals/Pain Pain Assessment Pain Assessment: Faces Faces Pain Scale: Hurts whole lot Pain Location: L knee with any motion Pain Descriptors / Indicators: Grimacing, Guarding, Moaning Pain Intervention(s): Monitored during session, Premedicated before session, Repositioned    Home Living                          Prior Function            PT Goals (current goals can now be found in the care plan section) Acute Rehab PT Goals Patient Stated Goal: to go to short term rehab, return to independence PT Goal Formulation: With patient Time For Goal Achievement: 01/09/23 Potential to Achieve Goals: Fair Progress towards PT goals: Progressing toward goals (very slowly)    Frequency    7X/week      PT Plan Current plan remains appropriate    Co-evaluation              AM-PAC PT "6 Clicks" Mobility   Outcome Measure  Help needed turning from your back to your side while in a flat bed without using bedrails?: A Lot Help needed moving from lying on your back to sitting on the side of a flat bed without using bedrails?: A Lot Help needed moving to and from a bed to a chair (including a wheelchair)?: Total Help needed standing up from a chair using your arms (e.g., wheelchair or bedside chair)?: Total Help needed to walk in hospital room?: Total Help needed climbing 3-5 steps with a railing? : Total 6 Click Score: 8    End of Session Equipment Utilized During Treatment: Other (comment) (bed pad) Activity Tolerance: Patient limited by pain Patient left: in chair;with call bell/phone within reach;with chair alarm set Nurse Communication: Mobility status PT Visit Diagnosis: Pain;Other abnormalities of gait and mobility (R26.89) Pain - Right/Left: Left Pain - part of body: Knee     Time: 1130-1226 PT Time Calculation (min) (ACUTE ONLY): 56 min  Charges:  $Therapeutic Activity: 53-67 mins                     Van Clines, PT   Acute Rehabilitation Services Office [phone] Secure Chat welcomed    Levi Aland 12/31/2022, 1:20 PM

## 2023-01-12 ENCOUNTER — Ambulatory Visit (INDEPENDENT_AMBULATORY_CARE_PROVIDER_SITE_OTHER): Payer: Medicare HMO | Admitting: Orthopedic Surgery

## 2023-01-12 ENCOUNTER — Other Ambulatory Visit (INDEPENDENT_AMBULATORY_CARE_PROVIDER_SITE_OTHER): Payer: Medicare HMO

## 2023-01-12 DIAGNOSIS — Z96652 Presence of left artificial knee joint: Secondary | ICD-10-CM

## 2023-01-26 ENCOUNTER — Encounter: Payer: Self-pay | Admitting: Orthopedic Surgery

## 2023-01-26 ENCOUNTER — Ambulatory Visit (INDEPENDENT_AMBULATORY_CARE_PROVIDER_SITE_OTHER): Payer: Medicare HMO | Admitting: Orthopedic Surgery

## 2023-01-26 DIAGNOSIS — Z96652 Presence of left artificial knee joint: Secondary | ICD-10-CM

## 2023-01-26 NOTE — Progress Notes (Unsigned)
Office Visit Note   Patient: Alison Whitehead           Date of Birth: Aug 18, 1950           MRN: [id number] Visit Date: 01/12/2023              Requested by: Burnice Logan, PA [address] [place],  [place] [address] PCP: Burnice Logan, [place]  Chief Complaint  Patient presents with   Left Knee - Routine Post Op    12/26/22 left total knee replacement      HPI: Patient is a 72 year old woman who is 2 weeks status post left total knee arthroplasty.  Assessment & Plan: Visit Diagnoses:  1. Status post total left knee replacement     Plan: Staples are harvested continue working on range of motion and aggressively work on extension.  Follow-Up Instructions: Return in about 2 weeks (around 01/26/2023).   Ortho Exam  Patient is alert, oriented, no adenopathy, well-dressed, normal affect, normal respiratory effort. Examination patient has range of motion from 10 to 90 degrees there is no signs of infection.  Staples are harvested.  The incision is well-healed.  Imaging: No results found. No images are attached to the encounter.  Labs: Lab Results  Component Value Date   HGBA1C 6.1 (H) 12/22/2022   HGBA1C 6.2 (H) 10/02/2019   ESRSEDRATE 4 11/08/2007     Lab Results  Component Value Date   ALBUMIN 3.4 (L) 10/07/2019   ALBUMIN 3.5 10/01/2019   ALBUMIN 3.7 09/30/2019    No results found for: "MG" No results found for: "VD25OH"  No results found for: "PREALBUMIN"    Latest Ref Rng & Units 12/28/2022    7:03 AM 12/22/2022    9:30 AM 05/15/2020   12:26 PM  CBC EXTENDED  WBC 4.0 - 10.5 K/uL 8.6  5.5  4.9   RBC 3.87 - 5.11 MIL/uL 3.74  4.40  4.02   Hemoglobin 12.0 - 15.0 g/dL 78.2  95.6  21.3   HCT 36.0 - 46.0 % 33.8  40.5  37.9   Platelets 150 - 400 K/uL 176  261  221      There is no height or weight on file to calculate BMI.  Orders:  Orders Placed This Encounter  Procedures   XR Knee 1-2 Views Left   No orders of the defined types were  placed in this encounter.    Procedures: No procedures performed  Clinical Data: No additional findings.  ROS:  All other systems negative, except as noted in the HPI. Review of Systems  Objective: Vital Signs: There were no vitals taken for this visit.  Specialty Comments:  No specialty comments available.  PMFS History: Patient Active Problem List   Diagnosis Date Noted   Total knee replacement status, 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   Rotator cuff syndrome, right 01/11/2020   Labile blood glucose    Vascular headache    Small vessel disease, cerebrovascular    Prediabetes    AKI (acute kidney injury) (HCC)    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   Acute cerebrovascular accident (CVA) (HCC) 09/30/2019   Unilateral primary osteoarthritis, left knee 03/17/2019   Unilateral primary osteoarthritis, right knee 03/17/2019   Bilateral primary osteoarthritis of knee 03/10/2019   INSOMNIA, CHRONIC 05/19/2007   SPONDYLOSIS, LUMBAR 05/19/2007   Essential hypertension 05/03/2007   ARTHRITIS, KNEE 05/03/2007   Past Medical History:  Diagnosis Date   Allergy    seasonal   Arthritis  CKD (chronic kidney disease)    DDD (degenerative disc disease)    Degenerative disc disease, lumbar    Hypertension    IBS (irritable bowel syndrome)    Osteopenia    Osteoporosis    osteopenia   Stroke (HCC) 09/2019    Family History  Problem Relation Age of Onset   Breast cancer Maternal Aunt    Diabetes Mother    Hypertension Mother    Glaucoma Mother    Other Father        accident   Diabetes Sister    Hypertension Sister    Cancer Sister    Colon cancer Neg Hx    Colon polyps Neg Hx    Esophageal cancer Neg Hx    Stomach cancer Neg Hx    Rectal cancer Neg Hx     Past Surgical History:  Procedure Laterality Date   SHOULDER SURGERY Left    lipoma   TOTAL KNEE ARTHROPLASTY Left 12/26/2022   Procedure: LEFT TOTAL KNEE ARTHROPLASTY;  Surgeon: Nadara Mustard, MD;  Location: [facility] OR;   Service: Orthopedics;  Laterality: Left;   Social History   Occupational History    Comment: retired  Tobacco Use   Smoking status: Former    Types: Cigarettes    Quit date: 10/13/2019    Years since quitting: 3.2   Smokeless tobacco: Never  Vaping Use   Vaping Use: Never used  Substance and Sexual Activity   Alcohol use: Never   Drug use: Never   Sexual activity: Not Currently    Birth control/protection: None

## 2023-01-27 ENCOUNTER — Encounter: Payer: Self-pay | Admitting: Orthopedic Surgery

## 2023-01-27 NOTE — Progress Notes (Signed)
Office Visit Note   Patient: Alison Whitehead           Date of Birth: [birth year]           MRN: [id number] Visit Date: 01/26/2023              Requested by: Burnice Logan, PA [address] [place],  [place] [address] PCP: Burnice Logan, [place]  Chief Complaint  Patient presents with   Left Knee - Routine Post Op    12/26/22 left TKA      HPI: Patient is a 72 year old woman who is 4 weeks status post left total knee arthroplasty.  She is doing physical therapy at skilled nursing.  Patient complains of pain.  Patient states that she is doing minimal activities and is not doing any extension exercises.  Assessment & Plan: Visit Diagnoses:  1. Status post total left knee replacement     Plan: Patient was given written instructions to aggressively work on extension and flexion exercises for her left knee.  Discussed that if she does not have improved range of motion at follow-up we would need to pursue manipulation under anesthesia at the operating room.  Follow-Up Instructions: Return in about 3 weeks (around 02/16/2023).   Ortho Exam  Patient is alert, oriented, no adenopathy, well-dressed, normal affect, normal respiratory effort. Examination there is no redness no cellulitis no signs of infection.  Patient has range of motion Of 20 to 90 degrees. Imaging: No results found. No images are attached to the encounter.  Labs: Lab Results  Component Value Date   HGBA1C 6.1 (H) 12/22/2022   HGBA1C 6.2 (H) 10/02/2019   ESRSEDRATE 4 11/08/2007     Lab Results  Component Value Date   ALBUMIN 3.4 (L) 10/07/2019   ALBUMIN 3.5 10/01/2019   ALBUMIN 3.7 09/30/2019    No results found for: "MG" No results found for: "VD25OH"  No results found for: "PREALBUMIN"    Latest Ref Rng & Units 12/28/2022    7:03 AM 12/22/2022    9:30 AM 05/15/2020   12:26 PM  CBC EXTENDED  WBC 4.0 - 10.5 K/uL 8.6  5.5  4.9   RBC 3.87 - 5.11 MIL/uL 3.74  4.40  4.02   Hemoglobin  12.0 - 15.0 g/dL 78.2  95.6  21.3   HCT 36.0 - 46.0 % 33.8  40.5  37.9   Platelets 150 - 400 K/uL 176  261  221      There is no height or weight on file to calculate BMI.  Orders:  No orders of the defined types were placed in this encounter.  No orders of the defined types were placed in this encounter.    Procedures: No procedures performed  Clinical Data: No additional findings.  ROS:  All other systems negative, except as noted in the HPI. Review of Systems  Objective: Vital Signs: There were no vitals taken for this visit.  Specialty Comments:  No specialty comments available.  PMFS History: Patient Active Problem List   Diagnosis Date Noted   Total knee replacement status, 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   Rotator cuff syndrome, right 01/11/2020   Labile blood glucose    Vascular headache    Small vessel disease, cerebrovascular    Prediabetes    AKI (acute kidney injury) (HCC)    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   Acute cerebrovascular accident (CVA) (HCC) 09/30/2019   Unilateral primary osteoarthritis, left knee 03/17/2019   Unilateral primary osteoarthritis, right knee  03/17/2019   Bilateral primary osteoarthritis of knee 03/10/2019   INSOMNIA, CHRONIC 05/19/2007   SPONDYLOSIS, LUMBAR 05/19/2007   Essential hypertension 05/03/2007   ARTHRITIS, KNEE 05/03/2007   Past Medical History:  Diagnosis Date   Allergy    seasonal   Arthritis    CKD (chronic kidney disease)    DDD (degenerative disc disease)    Degenerative disc disease, lumbar    Hypertension    IBS (irritable bowel syndrome)    Osteopenia    Osteoporosis    osteopenia   Stroke (HCC) 09/2019    Family History  Problem Relation Age of Onset   Breast cancer Maternal Aunt    Diabetes Mother    Hypertension Mother    Glaucoma Mother    Other Father        accident   Diabetes Sister    Hypertension Sister    Cancer Sister    Colon cancer Neg Hx    Colon polyps Neg Hx    Esophageal cancer  Neg Hx    Stomach cancer Neg Hx    Rectal cancer Neg Hx     Past Surgical History:  Procedure Laterality Date   SHOULDER SURGERY Left    lipoma   TOTAL KNEE ARTHROPLASTY Left 12/26/2022   Procedure: LEFT TOTAL KNEE ARTHROPLASTY;  Surgeon: Nadara Mustard, MD;  Location: [facility] OR;  Service: Orthopedics;  Laterality: Left;   Social History   Occupational History    Comment: retired  Tobacco Use   Smoking status: Former    Types: Cigarettes    Quit date: 10/13/2019    Years since quitting: 3.2   Smokeless tobacco: Never  Vaping Use   Vaping Use: Never used  Substance and Sexual Activity   Alcohol use: Never   Drug use: Never   Sexual activity: Not Currently    Birth control/protection: None

## 2023-02-16 ENCOUNTER — Ambulatory Visit (INDEPENDENT_AMBULATORY_CARE_PROVIDER_SITE_OTHER): Payer: Medicare HMO | Admitting: Orthopedic Surgery

## 2023-02-16 ENCOUNTER — Encounter: Payer: Self-pay | Admitting: Orthopedic Surgery

## 2023-02-16 DIAGNOSIS — Z96652 Presence of left artificial knee joint: Secondary | ICD-10-CM

## 2023-02-16 NOTE — Progress Notes (Signed)
Office Visit Note   Patient: Alison Whitehead           Date of Birth: 1951/04/10           MRN: [id number] Visit Date: 02/16/2023              Requested by: Burnice Logan, PA [address] [place],  [place] [address] PCP: Burnice Logan, [place]  Chief Complaint  Patient presents with   Left Knee - Routine Post Op    12/26/22 left TKA      HPI: Patient is a 72 year old woman who is 6 weeks status post left total knee arthroplasty.  Patient has been working aggressively on range of motion of the left knee.  Patient feels like she is making slow steady improvement.  Assessment & Plan: Visit Diagnoses:  1. Status post total left knee replacement     Plan: Refill prescription provided for Robaxin.  She will continue to work on knee extension and scar massage.  Follow-Up Instructions: Return in about 4 weeks (around 03/16/2023).   Ortho Exam  Patient is alert, oriented, no adenopathy, well-dressed, normal affect, normal respiratory effort. Examination left knee patient has improved extension she lacks about 5 degrees to full extension.  She has good flexion past 90 degrees.  Patient states she would like to continue working on range of motion of her own with therapy and does not want to consider manipulation under anesthesia.  Imaging: No results found. No images are attached to the encounter.  Labs: Lab Results  Component Value Date   HGBA1C 6.1 (H) 12/22/2022   HGBA1C 6.2 (H) 10/02/2019   ESRSEDRATE 4 11/08/2007     Lab Results  Component Value Date   ALBUMIN 3.4 (L) 10/07/2019   ALBUMIN 3.5 10/01/2019   ALBUMIN 3.7 09/30/2019    No results found for: "MG" No results found for: "VD25OH"  No results found for: "PREALBUMIN"    Latest Ref Rng & Units 12/28/2022    7:03 AM 12/22/2022    9:30 AM 05/15/2020   12:26 PM  CBC EXTENDED  WBC 4.0 - 10.5 K/uL 8.6  5.5  4.9   RBC 3.87 - 5.11 MIL/uL 3.74  4.40  4.02   Hemoglobin 12.0 - 15.0 g/dL 78.2  95.6   21.3   HCT 36.0 - 46.0 % 33.8  40.5  37.9   Platelets 150 - 400 K/uL 176  261  221      There is no height or weight on file to calculate BMI.  Orders:  No orders of the defined types were placed in this encounter.  No orders of the defined types were placed in this encounter.    Procedures: No procedures performed  Clinical Data: No additional findings.  ROS:  All other systems negative, except as noted in the HPI. Review of Systems  Objective: Vital Signs: There were no vitals taken for this visit.  Specialty Comments:  No specialty comments available.  PMFS History: Patient Active Problem List   Diagnosis Date Noted   Total knee replacement status, 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   Rotator cuff syndrome, right 01/11/2020   Labile blood glucose    Vascular headache    Small vessel disease, cerebrovascular    Prediabetes    AKI (acute kidney injury) (HCC)    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   Acute cerebrovascular accident (CVA) (HCC) 09/30/2019   Unilateral primary osteoarthritis, left knee 03/17/2019   Unilateral primary osteoarthritis, right knee 03/17/2019  Bilateral primary osteoarthritis of knee 03/10/2019   INSOMNIA, CHRONIC 05/19/2007   SPONDYLOSIS, LUMBAR 05/19/2007   Essential hypertension 05/03/2007   ARTHRITIS, KNEE 05/03/2007   Past Medical History:  Diagnosis Date   Allergy    seasonal   Arthritis    CKD (chronic kidney disease)    DDD (degenerative disc disease)    Degenerative disc disease, lumbar    Hypertension    IBS (irritable bowel syndrome)    Osteopenia    Osteoporosis    osteopenia   Stroke (HCC) 09/2019    Family History  Problem Relation Age of Onset   Breast cancer Maternal Aunt    Diabetes Mother    Hypertension Mother    Glaucoma Mother    Other Father        accident   Diabetes Sister    Hypertension Sister    Cancer Sister    Colon cancer Neg Hx    Colon polyps Neg Hx    Esophageal cancer Neg Hx    Stomach cancer  Neg Hx    Rectal cancer Neg Hx     Past Surgical History:  Procedure Laterality Date   SHOULDER SURGERY Left    lipoma   TOTAL KNEE ARTHROPLASTY Left 12/26/2022   Procedure: LEFT TOTAL KNEE ARTHROPLASTY;  Surgeon: Nadara Mustard, MD;  Location: [facility] OR;  Service: Orthopedics;  Laterality: Left;   Social History   Occupational History    Comment: retired  Tobacco Use   Smoking status: Former    Current packs/day: 0.00    Types: Cigarettes    Quit date: 10/13/2019    Years since quitting: 3.3   Smokeless tobacco: Never  Vaping Use   Vaping status: Never Used  Substance and Sexual Activity   Alcohol use: Never   Drug use: Never   Sexual activity: Not Currently    Birth control/protection: None

## 2023-02-18 ENCOUNTER — Telehealth: Payer: Self-pay | Admitting: Orthopedic Surgery

## 2023-02-18 ENCOUNTER — Other Ambulatory Visit: Payer: Self-pay | Admitting: Orthopedic Surgery

## 2023-02-18 MED ORDER — METHOCARBAMOL 500 MG PO TABS: 500.0000 mg | ORAL_TABLET | Freq: Three times a day (TID) | ORAL | 0 refills | Status: DC | PRN

## 2023-02-18 NOTE — Telephone Encounter (Signed)
Called pt to let her know we will put in order to [facility] for DME. She will need to answer her phone when they call her due to any out of pocket expenses, they will not deliver unless she speaks with them first.

## 2023-02-18 NOTE — Telephone Encounter (Signed)
Pt called requesting a refill for robaxin 500mg  takes one tablet q6h. She says she was given this while in rehab and would like to have some more for her knee.

## 2023-02-18 NOTE — Telephone Encounter (Signed)
Patient called asked if she can get a bedside commode and a walker. Patient said she is having trouble sitting down if the commode is low. Patient said she requested the walker two weeks ago when she was at the facility.  The number to contact patient is [phone]

## 2023-02-18 NOTE — Telephone Encounter (Signed)
Given Flor VO for PT.

## 2023-02-18 NOTE — Telephone Encounter (Signed)
Pt informed

## 2023-02-18 NOTE — Telephone Encounter (Signed)
Gorden Harms w/[facility] requesting home health PT for  4 weeks 2 times a week    Flor call back [phone]

## 2023-02-19 NOTE — Telephone Encounter (Signed)
Order placed on [facility].

## 2023-02-20 NOTE — Telephone Encounter (Signed)
Still pending

## 2023-02-24 NOTE — Telephone Encounter (Signed)
I have sent a message to [facility] to check the status of the DME ordered.

## 2023-03-03 ENCOUNTER — Telehealth: Payer: Self-pay | Admitting: Orthopedic Surgery

## 2023-03-03 NOTE — Telephone Encounter (Signed)
The only request I got was for a  regular walker and a bed side commode. If it needed to be with a seat, it should have been specified due to many different types of DME.

## 2023-03-03 NOTE — Telephone Encounter (Signed)
Pt called in stating she was sent the wrong Dan Humphreys she thought he requested the Walker with the Seat also she stated she has almost fell a few times going to the bathroom the toilet sits very low and she is requesting a Rx for a raised toilet seat please advise

## 2023-03-03 NOTE — Telephone Encounter (Signed)
Address, phone and insurance was confirmed

## 2023-03-03 NOTE — Telephone Encounter (Signed)
Sent message to [facility] to see about an exchange? Also we ordered her a bed side commode too and asked about raised toilet seat.

## 2023-03-04 NOTE — Telephone Encounter (Signed)
Sent another message to [facility] to advise on how to take care of this. Haven't heard back from my request yesterday.

## 2023-03-05 NOTE — Telephone Encounter (Signed)
[facility] has responded and stated if I would put in new order for these items they will pick up the regular walker and exchange with delivery of new one. I am in process of putting in new order.

## 2023-03-06 NOTE — Telephone Encounter (Signed)
Message received from [facility]:   Hello, this patient received a walker on 10/28/2019. A walker and rollator are ran under insurance as the same thing. We can not offer an out of pocket price due to patient having medicaid. Patient will need to out source for the rollator.   I replied back that I was told to put in new order and the walker she just got this week would be exchanged with new one. Awaiting reply.

## 2023-03-06 NOTE — Telephone Encounter (Signed)
Toilet seat delivered to pt yesterday.

## 2023-03-09 ENCOUNTER — Telehealth: Payer: Self-pay | Admitting: Orthopedic Surgery

## 2023-03-09 NOTE — Telephone Encounter (Signed)
Flor from [facility] called wanting to know if the patient is set up for patient rehab. If not then need to get an extension on home health services. [phone]

## 2023-03-11 NOTE — Telephone Encounter (Signed)
I called and sw HHPT to advise that ok to extend visits pt has follow up on 03/16/2023  and will discuss formal outpatient therapy at that time.

## 2023-03-16 ENCOUNTER — Ambulatory Visit (INDEPENDENT_AMBULATORY_CARE_PROVIDER_SITE_OTHER): Payer: Medicare HMO | Admitting: Orthopedic Surgery

## 2023-03-16 ENCOUNTER — Encounter: Payer: Self-pay | Admitting: Orthopedic Surgery

## 2023-03-16 DIAGNOSIS — Z96652 Presence of left artificial knee joint: Secondary | ICD-10-CM

## 2023-03-16 MED ORDER — METHOCARBAMOL 500 MG PO TABS: 500.0000 mg | ORAL_TABLET | Freq: Three times a day (TID) | ORAL | 0 refills | Status: DC | PRN

## 2023-03-16 NOTE — Progress Notes (Signed)
Office Visit Note   Patient: Alison Whitehead           Date of Birth: 1950/12/02           MRN: [id number] Visit Date: 03/16/2023              Requested by: Burnice Logan, PA [address] [place],  [place] [address] PCP: Burnice Logan, [place]  Chief Complaint  Patient presents with   Left Knee - Routine Post Op    12/26/22 left TKA      HPI: Patient is a 72 year old woman who is 2 and half months status post left total knee arthroplasty.  Her home health therapy has been extended through this week.  Assessment & Plan: Visit Diagnoses:  1. Status post total left knee replacement     Plan: Order placed for physical therapy upstairs, refill prescription for Robaxin.  Follow-Up Instructions: Return in about 4 weeks (around 04/13/2023).   Ortho Exam  Patient is alert, oriented, no adenopathy, well-dressed, normal affect, normal respiratory effort. Examination patient lacks about 5 degrees to full extension there is no effusion her knee is straight patella tracks midline.  Imaging: No results found. No images are attached to the encounter.  Labs: Lab Results  Component Value Date   HGBA1C 6.1 (H) 12/22/2022   HGBA1C 6.2 (H) 10/02/2019   ESRSEDRATE 4 11/08/2007     Lab Results  Component Value Date   ALBUMIN 3.4 (L) 10/07/2019   ALBUMIN 3.5 10/01/2019   ALBUMIN 3.7 09/30/2019    No results found for: "MG" No results found for: "VD25OH"  No results found for: "PREALBUMIN"    Latest Ref Rng & Units 12/28/2022    7:03 AM 12/22/2022    9:30 AM 05/15/2020   12:26 PM  CBC EXTENDED  WBC 4.0 - 10.5 K/uL 8.6  5.5  4.9   RBC 3.87 - 5.11 MIL/uL 3.74  4.40  4.02   Hemoglobin 12.0 - 15.0 g/dL 64.4  03.4  74.2   HCT 36.0 - 46.0 % 33.8  40.5  37.9   Platelets 150 - 400 K/uL 176  261  221      There is no height or weight on file to calculate BMI.  Orders:  No orders of the defined types were placed in this encounter.  Meds ordered this encounter   Medications   methocarbamol (ROBAXIN) 500 MG tablet    Sig: Take 1 tablet (500 mg total) by mouth every 8 (eight) hours as needed for muscle spasms.    Dispense:  30 tablet    Refill:  0     Procedures: No procedures performed  Clinical Data: No additional findings.  ROS:  All other systems negative, except as noted in the HPI. Review of Systems  Objective: Vital Signs: There were no vitals taken for this visit.  Specialty Comments:  No specialty comments available.  PMFS History: Patient Active Problem List   Diagnosis Date Noted   Total knee replacement status, 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   Rotator cuff syndrome, right 01/11/2020   Labile blood glucose    Vascular headache    Small vessel disease, cerebrovascular    Prediabetes    AKI (acute kidney injury) (HCC)    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   Acute cerebrovascular accident (CVA) (HCC) 09/30/2019   Unilateral primary osteoarthritis, left knee 03/17/2019   Unilateral primary osteoarthritis, right knee 03/17/2019   Bilateral primary osteoarthritis of knee 03/10/2019   INSOMNIA, CHRONIC 05/19/2007  SPONDYLOSIS, LUMBAR 05/19/2007   Essential hypertension 05/03/2007   ARTHRITIS, KNEE 05/03/2007   Past Medical History:  Diagnosis Date   Allergy    seasonal   Arthritis    CKD (chronic kidney disease)    DDD (degenerative disc disease)    Degenerative disc disease, lumbar    Hypertension    IBS (irritable bowel syndrome)    Osteopenia    Osteoporosis    osteopenia   Stroke (HCC) 09/2019    Family History  Problem Relation Age of Onset   Breast cancer Maternal Aunt    Diabetes Mother    Hypertension Mother    Glaucoma Mother    Other Father        accident   Diabetes Sister    Hypertension Sister    Cancer Sister    Colon cancer Neg Hx    Colon polyps Neg Hx    Esophageal cancer Neg Hx    Stomach cancer Neg Hx    Rectal cancer Neg Hx     Past Surgical History:  Procedure Laterality Date    SHOULDER SURGERY Left    lipoma   TOTAL KNEE ARTHROPLASTY Left 12/26/2022   Procedure: LEFT TOTAL KNEE ARTHROPLASTY;  Surgeon: Nadara Mustard, MD;  Location: [facility] OR;  Service: Orthopedics;  Laterality: Left;   Social History   Occupational History    Comment: retired  Tobacco Use   Smoking status: Former    Current packs/day: 0.00    Types: Cigarettes    Quit date: 10/13/2019    Years since quitting: 3.4   Smokeless tobacco: Never  Vaping Use   Vaping status: Never Used  Substance and Sexual Activity   Alcohol use: Never   Drug use: Never   Sexual activity: Not Currently    Birth control/protection: None

## 2023-03-17 ENCOUNTER — Other Ambulatory Visit: Payer: Self-pay | Admitting: Orthopedic Surgery

## 2023-03-19 ENCOUNTER — Other Ambulatory Visit: Payer: Self-pay | Admitting: Nurse Practitioner

## 2023-03-19 DIAGNOSIS — Z1231 Encounter for screening mammogram for malignant neoplasm of breast: Secondary | ICD-10-CM

## 2023-03-20 ENCOUNTER — Other Ambulatory Visit: Payer: Self-pay | Admitting: Orthopedic Surgery

## 2023-03-27 ENCOUNTER — Ambulatory Visit: Payer: Medicare HMO

## 2023-03-31 ENCOUNTER — Other Ambulatory Visit: Payer: Self-pay

## 2023-03-31 ENCOUNTER — Ambulatory Visit: Payer: Medicare HMO | Attending: Physician Assistant

## 2023-03-31 DIAGNOSIS — G8929 Other chronic pain: Secondary | ICD-10-CM | POA: Insufficient documentation

## 2023-03-31 DIAGNOSIS — M25562 Pain in left knee: Secondary | ICD-10-CM | POA: Insufficient documentation

## 2023-03-31 DIAGNOSIS — M6281 Muscle weakness (generalized): Secondary | ICD-10-CM | POA: Diagnosis present

## 2023-03-31 DIAGNOSIS — R2689 Other abnormalities of gait and mobility: Secondary | ICD-10-CM | POA: Diagnosis present

## 2023-03-31 NOTE — Therapy (Signed)
OUTPATIENT PHYSICAL THERAPY LOWER EXTREMITY EVALUATION   Patient Name: Alison Whitehead MRN: [id number] DOB:1951-01-13, 72 y.o., female Today's Date: 04/01/2023  END OF SESSION:  PT End of Session - 04/01/23 0806     Visit Number 1    Number of Visits 20    Date for PT Re-Evaluation 06/09/23    Authorization Type [employer] MCR    PT Start Time 1445    PT Stop Time 1525    PT Time Calculation (min) 40 min    Activity Tolerance Patient tolerated treatment well    Behavior During Therapy [facility] for tasks assessed/performed             Past Medical History:  Diagnosis Date   Allergy    seasonal   Arthritis    CKD (chronic kidney disease)    DDD (degenerative disc disease)    Degenerative disc disease, lumbar    Hypertension    IBS (irritable bowel syndrome)    Osteopenia    Osteoporosis    osteopenia   Stroke (HCC) 09/2019   Past Surgical History:  Procedure Laterality Date   SHOULDER SURGERY Left    lipoma   TOTAL KNEE ARTHROPLASTY Left 12/26/2022   Procedure: LEFT TOTAL KNEE ARTHROPLASTY;  Surgeon: Nadara Mustard, MD;  Location: [facility] OR;  Service: Orthopedics;  Laterality: Left;   Patient Active Problem List   Diagnosis Date Noted   Total knee replacement status, 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   Rotator cuff syndrome, right 01/11/2020   Labile blood glucose    Vascular headache    Small vessel disease, cerebrovascular    Prediabetes    AKI (acute kidney injury) (HCC)    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   Acute cerebrovascular accident (CVA) (HCC) 09/30/2019   Unilateral primary osteoarthritis, left knee 03/17/2019   Unilateral primary osteoarthritis, right knee 03/17/2019   Bilateral primary osteoarthritis of knee 03/10/2019   INSOMNIA, CHRONIC 05/19/2007   SPONDYLOSIS, LUMBAR 05/19/2007   Essential hypertension 05/03/2007   ARTHRITIS, KNEE 05/03/2007    PCP: Burnice Logan, PA  REFERRING PROVIDER: Nadara Mustard, MD   REFERRING DIAG: [phone] (ICD-10-CM) - Status  post total left knee replacement   THERAPY DIAG:  Chronic pain of left knee - Plan: PT plan of care cert/re-cert  Muscle weakness (generalized) - Plan: PT plan of care cert/re-cert  Other abnormalities of gait and mobility - Plan: PT plan of care cert/re-cert  Rationale for Evaluation and Treatment: Rehabilitation  ONSET DATE: 12/26/2022  SUBJECTIVE:   SUBJECTIVE STATEMENT: Pt presents to PT s/p L TKA performed by Dr. Lajoyce Corners on 12/26/2022. Initially discharged from acute care to SNF followed by multiple weeks of HHPT. Notes continued stiffness in L knee, especially with extension and has a lot of difficulty getting comfortable at night with sleep. Has been compliant with her HEP from home health, has continued FWW use and starting using a SPC before discharge from HHPT.   PERTINENT HISTORY: CVA, CKD, Osteoporosis, HTN  PAIN:  Are you having pain?  Yes: NPRS scale: 7/10 Worst: 10/10  Pain location: L knee;  Pain description: tight, sharp Aggravating factors: standing, walking, cooking Relieving factors: medication, rest  PRECAUTIONS: None  WEIGHT BEARING RESTRICTIONS: No  FALLS:  Has patient fallen in last 6 months? No  LIVING ENVIRONMENT: Lives with: lives alone Lives in: House/apartment Stairs: No Has following equipment at home: Single point cane and Environmental consultant - 2 wheeled  OCCUPATION: Retired  PLOF: Independent  PATIENT GOALS: improve mobility in left knee in  order to get around the home better and decrease pain  OBJECTIVE:   DIAGNOSTIC FINDINGS:   See imaging  PATIENT SURVEYS:  FOTO: 49% function; 59% predicted   COGNITION: Overall cognitive status: Within functional limits for tasks assessed     SENSATION: WFL  POSTURE: rounded shoulders and forward head  PALPATION: TTP to L distal quad  LOWER EXTREMITY ROM:  Active ROM Right eval Left eval  Hip flexion    Hip extension    Hip abduction    Hip adduction    Hip internal rotation    Hip  external rotation    Knee flexion  96  Knee extension  11  Ankle dorsiflexion    Ankle plantarflexion    Ankle inversion    Ankle eversion     (Blank rows = not tested)  LOWER EXTREMITY MMT:  MMT Right eval Left eval  Hip flexion    Hip extension    Hip abduction    Hip adduction    Hip internal rotation    Hip external rotation    Knee flexion    Knee extension    Ankle dorsiflexion    Ankle plantarflexion    Ankle inversion    Ankle eversion     (Blank rows = not tested)  LOWER EXTREMITY SPECIAL TESTS:  DNT  FUNCTIONAL TESTS:  30 Second Sit to Stand: 7 reps - with UE TUG: 29 seconds with FWW  GAIT: Distance walked: 104ft Assistive device utilized: Environmental consultant - 2 wheeled Level of assistance: Modified independence Comments: antalgic gait L, decreased L knee extension   TREATMENT: [facility] Adult PT Treatment:                                                DATE: 03/31/2023 Therapeutic Exercise: Supine quad set x 10 - 5" hold Supine SLR x 5 L Supine heel slide x 5 - 5" hold Seated hamstring stretch x 30" L  PATIENT EDUCATION:  Education details: eval findings, FOTO, HEP, POC Person educated: Patient Education method: Explanation, Demonstration, and Handouts Education comprehension: verbalized understanding and returned demonstration  HOME EXERCISE PROGRAM: Access Code: [email or web address] URL: [email or web address] Date: 03/31/2023 Prepared by: Edwinna Areola  Exercises - Supine Quadricep Sets  - 3 x daily - 7 x weekly - 2 sets - 10 reps - 5 sec hold - Active Straight Leg Raise with Quad Set  - 3 x daily - 7 x weekly - 2 sets - 10 reps - Supine Heel Slide with Strap  - 3 x daily - 7 x weekly - 2 sets - 10 reps - 5 sec hold - Seated Hamstring Stretch  - 3 x daily - 7 x weekly - 2 reps - 30 sec hold  ASSESSMENT:  CLINICAL IMPRESSION: Patient is a 72 y.o. F who was seen today for physical therapy evaluation and treatment s/p L TKA performed by Dr. Lajoyce Corners on  12/26/2022. Physical findings are consistent with surgery and recovery timeline as pt demonstrates continued deficits in L knee strength, ROM, and general functional mobility. Pt FOTO score indicates decrease in subjective functional ability below PLOF. Pt requires skilled PT services working on improving strength and mobility post surgery.    OBJECTIVE IMPAIRMENTS: decreased activity tolerance, decreased balance, decreased mobility, difficulty walking, decreased ROM, decreased strength, and pain  ACTIVITY LIMITATIONS: sitting, standing, squatting, stairs, transfers, and locomotion  level  PARTICIPATION LIMITATIONS: cleaning, driving, shopping, community activity, and yard work  PERSONAL FACTORS: Fitness and 3+ comorbidities: CVA, CKD, Osteoporosis, HTN  are also affecting patient's functional outcome.   REHAB POTENTIAL: Good  CLINICAL DECISION MAKING: Stable/uncomplicated  EVALUATION COMPLEXITY: Low   GOALS: Goals reviewed with patient? No  SHORT TERM GOALS: Target date: 04/21/2023   Pt will be compliant and knowledgeable with initial HEP for improved comfort and carryover Baseline: initial HEP given  Goal status: INITIAL  2.  Pt will self report left knee pain no greater than 6/10 for improved comfort and functional ability Baseline: 10/10 at worst Goal status: INITIAL   LONG TERM GOALS: Target date: 06/09/2023   Pt will improve FOTO function score to no less than 59% as proxy for functional improvement Baseline: 49% function Goal status: INITIAL   2.  Pt will self report left knee pain no greater than 3/10 for improved comfort and functional ability Baseline: 10/10 at worst Goal status: INITIAL   3.  Pt will increase 30 Second Sit to Stand rep count to no less than 9 reps (MCID 2 reps) for improved balance, strength, and functional mobility Baseline: 7 reps with UE Goal status: INITIAL   4.  Pt will decrease TUG time score to no greater than 18 seconds with LRAD for  improved balance and functional mobility at home and community Baseline: 29 seconds with FWW Goal status: INITIAL  5.  Pt will improve L knee AROM to range of at least 0-115 degrees for improving functional mobility and community navigation post sugery Baseline: see ROM chart Goal status: INITIAL   PLAN:  PT FREQUENCY: 2x/week  PT DURATION: 10 weeks  PLANNED INTERVENTIONS: Therapeutic exercises, Therapeutic activity, Neuromuscular re-education, Balance training, Gait training, Patient/Family education, Self Care, Joint mobilization, Dry Needling, Electrical stimulation, Cryotherapy, Moist heat, Vasopneumatic device, Manual therapy, and Re-evaluation  PLAN FOR NEXT SESSION: assess HEP response, L knee ROM, LE strengthening, gait training    Eloy End, PT 04/01/2023, 8:15 AM

## 2023-04-02 ENCOUNTER — Encounter: Payer: Self-pay | Admitting: Physical Therapy

## 2023-04-02 ENCOUNTER — Other Ambulatory Visit: Payer: Self-pay

## 2023-04-02 ENCOUNTER — Ambulatory Visit: Payer: Medicare HMO | Admitting: Physical Therapy

## 2023-04-02 DIAGNOSIS — M25562 Pain in left knee: Secondary | ICD-10-CM | POA: Diagnosis not present

## 2023-04-02 DIAGNOSIS — G8929 Other chronic pain: Secondary | ICD-10-CM

## 2023-04-02 DIAGNOSIS — M6281 Muscle weakness (generalized): Secondary | ICD-10-CM

## 2023-04-02 DIAGNOSIS — R2689 Other abnormalities of gait and mobility: Secondary | ICD-10-CM

## 2023-04-02 NOTE — Therapy (Signed)
OUTPATIENT PHYSICAL THERAPY TREATMENT   Patient Name: Alison Whitehead MRN: [id number] DOB:[birth year], 72 y.o., female Today's Date: 04/02/2023   END OF SESSION:  PT End of Session - 04/02/23 1027     Visit Number 2    Number of Visits 20    Date for PT Re-Evaluation 06/09/23    Authorization Type [employer] MCR    Progress Note Due on Visit 10    PT Start Time 1020    PT Stop Time 1105    PT Time Calculation (min) 45 min    Activity Tolerance Patient tolerated treatment well    Behavior During Therapy WFL for tasks assessed/performed              Past Medical History:  Diagnosis Date   Allergy    seasonal   Arthritis    CKD (chronic kidney disease)    DDD (degenerative disc disease)    Degenerative disc disease, lumbar    Hypertension    IBS (irritable bowel syndrome)    Osteopenia    Osteoporosis    osteopenia   Stroke (HCC) 09/2019   Past Surgical History:  Procedure Laterality Date   SHOULDER SURGERY Left    lipoma   TOTAL KNEE ARTHROPLASTY Left 12/26/2022   Procedure: LEFT TOTAL KNEE ARTHROPLASTY;  Surgeon: Nadara Mustard, MD;  Location: [facility] OR;  Service: Orthopedics;  Laterality: Left;   Patient Active Problem List   Diagnosis Date Noted   Total knee replacement status, 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   Rotator cuff syndrome, right 01/11/2020   Labile blood glucose    Vascular headache    Small vessel disease, cerebrovascular    Prediabetes    AKI (acute kidney injury) (HCC)    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   Acute cerebrovascular accident (CVA) (HCC) 09/30/2019   Unilateral primary osteoarthritis, left knee 03/17/2019   Unilateral primary osteoarthritis, right knee 03/17/2019   Bilateral primary osteoarthritis of knee 03/10/2019   INSOMNIA, CHRONIC 05/19/2007   SPONDYLOSIS, LUMBAR 05/19/2007   Essential hypertension 05/03/2007   ARTHRITIS, KNEE 05/03/2007    PCP: Burnice Logan, PA  REFERRING PROVIDER: Nadara Mustard, MD   REFERRING DIAG: [phone]  (ICD-10-CM) - Status post total left knee replacement   THERAPY DIAG:  Chronic pain of left knee  Muscle weakness (generalized)  Other abnormalities of gait and mobility  Rationale for Evaluation and Treatment: Rehabilitation  ONSET DATE: 12/26/2022   SUBJECTIVE:  SUBJECTIVE STATEMENT: Patent reports she is doing well. She is consistent with her exercises at home.   PERTINENT HISTORY: CVA, CKD, Osteoporosis, HTN  PAIN:  Are you having pain?  Yes: NPRS scale: 7/10 Worst: 10/10  Pain location: L knee;  Pain description: tight, sharp Aggravating factors: standing, walking, cooking Relieving factors: medication, rest  PRECAUTIONS: None  WEIGHT BEARING RESTRICTIONS: No  PATIENT GOALS: improve mobility in left knee in order to get around the home better and decrease pain   OBJECTIVE:  PATIENT SURVEYS:  FOTO: 49% function; 59% predicted   POSTURE: rounded shoulders and forward head  PALPATION: TTP to L distal quad  LOWER EXTREMITY ROM:  Active ROM Right eval Left eval Left 04/02/2023  Hip flexion     Hip extension     Hip abduction     Hip adduction     Hip internal rotation     Hip external rotation     Knee flexion  96 100 deg  Knee extension  11 Lacking 10 deg  Ankle dorsiflexion  Ankle plantarflexion     Ankle inversion     Ankle eversion      (Blank rows = not tested)  LOWER EXTREMITY MMT:  MMT Right eval Left eval  Hip flexion    Hip extension    Hip abduction    Hip adduction    Hip internal rotation    Hip external rotation    Knee flexion    Knee extension    Ankle dorsiflexion    Ankle plantarflexion    Ankle inversion    Ankle eversion     (Blank rows = not tested)  LOWER EXTREMITY SPECIAL TESTS:  DNT  FUNCTIONAL TESTS:  30 Second Sit to Stand: 7 reps - with UE TUG: 29 seconds with FWW  GAIT: Distance walked: 80ft Assistive device utilized: Environmental consultant - 2 wheeled Level of assistance: Modified independence Comments:  antalgic gait L, decreased L knee extension   TREATMENT: [facility] Adult PT Treatment:                                                DATE: 04/02/2023 Therapeutic Exercise: NuStep L5 x 5 min with UE/LE to improving knee motion Supine heel slide with strap 2 x 5 with 5 sec hold  SLR 2 x 10 SAQ with 2# 2 x 10 LAQ with 2# 2 x 10 Sit to stand from elevated table 2 x 10 Seated hamstring stretch 2 x 20 sec Slant board calf stretch 3 x 20 sec Walking in // bars with right UE support to simulate using cane x 4 lengths  Walking with cane on right in // bars x 4 lengths Manual: Seated and supine knee mobs to improve knee flexion and extension Seated and supine knee PROM for flexion and extension Supine patellar mobs all directions   [facility] Adult PT Treatment:                                                DATE: 03/31/2023 Therapeutic Exercise: Supine quad set x 10 - 5" hold Supine SLR x 5 L Supine heel slide x 5 - 5" hold Seated hamstring stretch x 30" L  PATIENT EDUCATION:  Education details: HEP Person educated: Patient Education method: Programmer, multimedia, Demonstration Education comprehension: verbalized understanding and returned demonstration  HOME EXERCISE PROGRAM: Access Code: [email or web address]  ASSESSMENT: CLINICAL IMPRESSION: Patient tolerated therapy well with no adverse effects. Therapy focused on progressing her knee mobility and strengthening with good tolerance. She exhibits improvement in her knee motion this visit. She does continue to exhibit difficulty standing from standard chair but able to tolerate elevated table much better and without using UE for support. Worked on walking using a SPC on the right and she did require cueing for coordinating the cane and stepping and upright posture. No changes made to HEP this visit. Patient would benefit from continued skilled PT to progress her mobility and strength in order to reduce pain and maximize functional ability.    OBJECTIVE IMPAIRMENTS:  decreased activity tolerance, decreased balance, decreased mobility, difficulty walking, decreased ROM, decreased strength, and pain  ACTIVITY LIMITATIONS: sitting, standing, squatting, stairs, transfers, and locomotion level  PARTICIPATION LIMITATIONS: cleaning, driving, shopping, community activity, and yard work  PERSONAL FACTORS: Fitness and 3+  comorbidities: CVA, CKD, Osteoporosis, HTN  are also affecting patient's functional outcome.    GOALS: Goals reviewed with patient? No  SHORT TERM GOALS: Target date: 04/21/2023   Pt will be compliant and knowledgeable with initial HEP for improved comfort and carryover Baseline: initial HEP given  Goal status: INITIAL  2.  Pt will self report left knee pain no greater than 6/10 for improved comfort and functional ability Baseline: 10/10 at worst Goal status: INITIAL   LONG TERM GOALS: Target date: 06/09/2023   Pt will improve FOTO function score to no less than 59% as proxy for functional improvement Baseline: 49% function Goal status: INITIAL   2.  Pt will self report left knee pain no greater than 3/10 for improved comfort and functional ability Baseline: 10/10 at worst Goal status: INITIAL   3.  Pt will increase 30 Second Sit to Stand rep count to no less than 9 reps (MCID 2 reps) for improved balance, strength, and functional mobility Baseline: 7 reps with UE Goal status: INITIAL   4.  Pt will decrease TUG time score to no greater than 18 seconds with LRAD for improved balance and functional mobility at home and community Baseline: 29 seconds with FWW Goal status: INITIAL  5.  Pt will improve L knee AROM to range of at least 0-115 degrees for improving functional mobility and community navigation post sugery Baseline: see ROM chart Goal status: INITIAL   PLAN: PT FREQUENCY: 2x/week  PT DURATION: 10 weeks  PLANNED INTERVENTIONS: Therapeutic exercises, Therapeutic activity, Neuromuscular re-education, Balance training,  Gait training, Patient/Family education, Self Care, Joint mobilization, Dry Needling, Electrical stimulation, Cryotherapy, Moist heat, Vasopneumatic device, Manual therapy, and Re-evaluation  PLAN FOR NEXT SESSION: assess HEP response, L knee ROM, LE strengthening, gait training    Rosana Hoes, PT, DPT, LAT, ATC 04/02/23  11:10 AM Phone: [phone] Fax: [phone]

## 2023-04-07 ENCOUNTER — Ambulatory Visit: Payer: Medicare HMO | Attending: Physician Assistant | Admitting: Physical Therapy

## 2023-04-07 ENCOUNTER — Other Ambulatory Visit: Payer: Self-pay

## 2023-04-07 ENCOUNTER — Encounter: Payer: Self-pay | Admitting: Physical Therapy

## 2023-04-07 DIAGNOSIS — R2689 Other abnormalities of gait and mobility: Secondary | ICD-10-CM | POA: Insufficient documentation

## 2023-04-07 DIAGNOSIS — G8929 Other chronic pain: Secondary | ICD-10-CM | POA: Diagnosis present

## 2023-04-07 DIAGNOSIS — M25562 Pain in left knee: Secondary | ICD-10-CM | POA: Insufficient documentation

## 2023-04-07 DIAGNOSIS — M6281 Muscle weakness (generalized): Secondary | ICD-10-CM | POA: Insufficient documentation

## 2023-04-07 NOTE — Therapy (Signed)
OUTPATIENT PHYSICAL THERAPY TREATMENT   Patient Name: Alison Whitehead MRN: [id number] DOB:1950-10-24, 72 y.o., female Today's Date: 04/07/2023   END OF SESSION:  PT End of Session - 04/07/23 1536     Visit Number 3    Number of Visits 20    Date for PT Re-Evaluation 06/09/23    Authorization Type [employer] MCR    Progress Note Due on Visit 10    PT Start Time 1530    PT Stop Time 1615    PT Time Calculation (min) 45 min    Activity Tolerance Patient tolerated treatment well    Behavior During Therapy [facility] for tasks assessed/performed               Past Medical History:  Diagnosis Date   Allergy    seasonal   Arthritis    CKD (chronic kidney disease)    DDD (degenerative disc disease)    Degenerative disc disease, lumbar    Hypertension    IBS (irritable bowel syndrome)    Osteopenia    Osteoporosis    osteopenia   Stroke (HCC) 09/2019   Past Surgical History:  Procedure Laterality Date   SHOULDER SURGERY Left    lipoma   TOTAL KNEE ARTHROPLASTY Left 12/26/2022   Procedure: LEFT TOTAL KNEE ARTHROPLASTY;  Surgeon: Nadara Mustard, MD;  Location: [facility] OR;  Service: Orthopedics;  Laterality: Left;   Patient Active Problem List   Diagnosis Date Noted   Total knee replacement status, 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   Rotator cuff syndrome, right 01/11/2020   Labile blood glucose    Vascular headache    Small vessel disease, cerebrovascular    Prediabetes    AKI (acute kidney injury) (HCC)    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   Acute cerebrovascular accident (CVA) (HCC) 09/30/2019   Unilateral primary osteoarthritis, left knee 03/17/2019   Unilateral primary osteoarthritis, right knee 03/17/2019   Bilateral primary osteoarthritis of knee 03/10/2019   INSOMNIA, CHRONIC 05/19/2007   SPONDYLOSIS, LUMBAR 05/19/2007   Essential hypertension 05/03/2007   ARTHRITIS, KNEE 05/03/2007    PCP: Burnice Logan, PA  REFERRING PROVIDER: Nadara Mustard, MD   REFERRING DIAG:  [phone] (ICD-10-CM) - Status post total left knee replacement   THERAPY DIAG:  Chronic pain of left knee  Muscle weakness (generalized)  Other abnormalities of gait and mobility  Rationale for Evaluation and Treatment: Rehabilitation  ONSET DATE: 12/26/2022   SUBJECTIVE:  SUBJECTIVE STATEMENT: Patent reports both her knees are hurting today. She mainly just stayed in bed and watched TV this past weekend.   PERTINENT HISTORY: CVA, CKD, Osteoporosis, HTN  PAIN:  Are you having pain?  Yes: NPRS scale: 7/10 Worst: 10/10  Pain location: L knee;  Pain description: tight, sharp Aggravating factors: standing, walking, cooking Relieving factors: medication, rest  PRECAUTIONS: None  WEIGHT BEARING RESTRICTIONS: No  PATIENT GOALS: improve mobility in left knee in order to get around the home better and decrease pain   OBJECTIVE:  PATIENT SURVEYS:  FOTO: 49% function; 59% predicted   POSTURE: rounded shoulders and forward head  PALPATION: TTP to L distal quad  LOWER EXTREMITY ROM:  Active ROM Right eval Left eval Left 04/02/2023 Left 04/07/2023  Hip flexion      Hip extension      Hip abduction      Hip adduction      Hip internal rotation      Hip external rotation      Knee flexion  96 100  deg 104 deg  Knee extension  11 Lacking 10 deg Lacking 8 deg  Ankle dorsiflexion      Ankle plantarflexion      Ankle inversion      Ankle eversion       (Blank rows = not tested)  LOWER EXTREMITY MMT:  MMT Right eval Left eval  Hip flexion    Hip extension    Hip abduction    Hip adduction    Hip internal rotation    Hip external rotation    Knee flexion    Knee extension    Ankle dorsiflexion    Ankle plantarflexion    Ankle inversion    Ankle eversion     (Blank rows = not tested)  LOWER EXTREMITY SPECIAL TESTS:  DNT  FUNCTIONAL TESTS:  30 Second Sit to Stand: 7 reps - with UE TUG: 29 seconds with FWW  GAIT: Distance walked: 33ft Assistive device  utilized: Environmental consultant - 2 wheeled Level of assistance: Modified independence Comments: antalgic gait L, decreased L knee extension   TREATMENT: [facility] Adult PT Treatment:                                                DATE: 04/07/2023 Therapeutic Exercise: NuStep L5 x 5 min with UE/LE to improving knee motion Modified thomas stretch 3 x 30 sec Supine heel slide with strap 2 x 5 with 5 sec hold  SLR 2 x 10 LAQ with 4# 3 x 10 Sit to stand from elevated table 3 x 5 Standing heel raises 2 x 10 Walking with cane on right x 110 ft Manual: Seated and supine knee mobs to improve knee flexion and extension Seated and supine knee PROM for flexion and extension Supine patellar mobs all directions   [facility] Adult PT Treatment:                                                DATE: 04/02/2023 Therapeutic Exercise: NuStep L5 x 5 min with UE/LE to improving knee motion Supine heel slide with strap 2 x 5 with 5 sec hold  SLR 2 x 10 SAQ with 2# 2 x 10 LAQ with 2# 2 x 10 Sit to stand from elevated table 2 x 10 Seated hamstring stretch 2 x 20 sec Slant board calf stretch 3 x 20 sec Walking in // bars with right UE support to simulate using cane x 4 lengths  Walking with cane on right in // bars x 4 lengths Manual: Seated and supine knee mobs to improve knee flexion and extension Seated and supine knee PROM for flexion and extension Supine patellar mobs all directions  [facility] Adult PT Treatment:                                                DATE: 03/31/2023 Therapeutic Exercise: Supine quad set x 10 - 5" hold Supine SLR x 5 L Supine heel slide x 5 - 5" hold Seated hamstring stretch x 30" L  PATIENT EDUCATION:  Education details: HEP Person educated: Patient Education method: Programmer, multimedia, Insurance underwriter  comprehension: verbalized understanding and returned demonstration  HOME EXERCISE PROGRAM: Access Code: [email or web address]  ASSESSMENT: CLINICAL IMPRESSION: Patient tolerated therapy well with no  adverse effects. Therapy focused on progressing left knee mobility and strength with good tolerance. She demonstrates improvement in her knee motion this visit. Progressed her quad strengthening and continued working on walking using a SPC. No changes to HEP this visit. Patient would benefit from continued skilled PT to progress her mobility and strength in order to reduce pain and maximize functional ability.   OBJECTIVE IMPAIRMENTS: decreased activity tolerance, decreased balance, decreased mobility, difficulty walking, decreased ROM, decreased strength, and pain  ACTIVITY LIMITATIONS: sitting, standing, squatting, stairs, transfers, and locomotion level  PARTICIPATION LIMITATIONS: cleaning, driving, shopping, community activity, and yard work  PERSONAL FACTORS: Fitness and 3+ comorbidities: CVA, CKD, Osteoporosis, HTN  are also affecting patient's functional outcome.    GOALS: Goals reviewed with patient? No  SHORT TERM GOALS: Target date: 04/21/2023   Pt will be compliant and knowledgeable with initial HEP for improved comfort and carryover Baseline: initial HEP given  Goal status: INITIAL  2.  Pt will self report left knee pain no greater than 6/10 for improved comfort and functional ability Baseline: 10/10 at worst Goal status: INITIAL   LONG TERM GOALS: Target date: 06/09/2023   Pt will improve FOTO function score to no less than 59% as proxy for functional improvement Baseline: 49% function Goal status: INITIAL   2.  Pt will self report left knee pain no greater than 3/10 for improved comfort and functional ability Baseline: 10/10 at worst Goal status: INITIAL   3.  Pt will increase 30 Second Sit to Stand rep count to no less than 9 reps (MCID 2 reps) for improved balance, strength, and functional mobility Baseline: 7 reps with UE Goal status: INITIAL   4.  Pt will decrease TUG time score to no greater than 18 seconds with LRAD for improved balance and functional mobility  at home and community Baseline: 29 seconds with FWW Goal status: INITIAL  5.  Pt will improve L knee AROM to range of at least 0-115 degrees for improving functional mobility and community navigation post sugery Baseline: see ROM chart Goal status: INITIAL   PLAN: PT FREQUENCY: 2x/week  PT DURATION: 10 weeks  PLANNED INTERVENTIONS: Therapeutic exercises, Therapeutic activity, Neuromuscular re-education, Balance training, Gait training, Patient/Family education, Self Care, Joint mobilization, Dry Needling, Electrical stimulation, Cryotherapy, Moist heat, Vasopneumatic device, Manual therapy, and Re-evaluation  PLAN FOR NEXT SESSION: assess HEP response, L knee ROM, LE strengthening, gait training    Rosana Hoes, PT, DPT, LAT, ATC 04/07/23  4:16 PM Phone: [phone] Fax: [phone]

## 2023-04-09 ENCOUNTER — Encounter: Payer: Self-pay | Admitting: Physical Therapy

## 2023-04-09 ENCOUNTER — Ambulatory Visit: Payer: Medicare HMO | Admitting: Physical Therapy

## 2023-04-09 ENCOUNTER — Other Ambulatory Visit: Payer: Self-pay

## 2023-04-09 DIAGNOSIS — M25562 Pain in left knee: Secondary | ICD-10-CM | POA: Diagnosis not present

## 2023-04-09 DIAGNOSIS — M6281 Muscle weakness (generalized): Secondary | ICD-10-CM

## 2023-04-09 DIAGNOSIS — R2689 Other abnormalities of gait and mobility: Secondary | ICD-10-CM

## 2023-04-09 DIAGNOSIS — G8929 Other chronic pain: Secondary | ICD-10-CM

## 2023-04-09 NOTE — Therapy (Signed)
OUTPATIENT PHYSICAL THERAPY TREATMENT   Patient Name: Alison Whitehead MRN: [id number] DOB:1951-07-06, 72 y.o., female Today's Date: 04/09/2023   END OF SESSION:  PT End of Session - 04/09/23 1555     Visit Number 4    Number of Visits 20    Date for PT Re-Evaluation 06/09/23    Authorization Type [employer] MCR    Progress Note Due on Visit 10    PT Start Time 1530    PT Stop Time 1615    PT Time Calculation (min) 45 min    Activity Tolerance Patient tolerated treatment well    Behavior During Therapy [facility] for tasks assessed/performed                Past Medical History:  Diagnosis Date   Allergy    seasonal   Arthritis    CKD (chronic kidney disease)    DDD (degenerative disc disease)    Degenerative disc disease, lumbar    Hypertension    IBS (irritable bowel syndrome)    Osteopenia    Osteoporosis    osteopenia   Stroke (HCC) 09/2019   Past Surgical History:  Procedure Laterality Date   SHOULDER SURGERY Left    lipoma   TOTAL KNEE ARTHROPLASTY Left 12/26/2022   Procedure: LEFT TOTAL KNEE ARTHROPLASTY;  Surgeon: Nadara Mustard, MD;  Location: [facility] OR;  Service: Orthopedics;  Laterality: Left;   Patient Active Problem List   Diagnosis Date Noted   Total knee replacement status, 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   Rotator cuff syndrome, right 01/11/2020   Labile blood glucose    Vascular headache    Small vessel disease, cerebrovascular    Prediabetes    AKI (acute kidney injury) (HCC)    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   Acute cerebrovascular accident (CVA) (HCC) 09/30/2019   Unilateral primary osteoarthritis, left knee 03/17/2019   Unilateral primary osteoarthritis, right knee 03/17/2019   Bilateral primary osteoarthritis of knee 03/10/2019   INSOMNIA, CHRONIC 05/19/2007   SPONDYLOSIS, LUMBAR 05/19/2007   Essential hypertension 05/03/2007   ARTHRITIS, KNEE 05/03/2007    PCP: Burnice Logan, PA  REFERRING PROVIDER: Nadara Mustard, MD   REFERRING DIAG:  [phone] (ICD-10-CM) - Status post total left knee replacement   THERAPY DIAG:  Chronic pain of left knee  Muscle weakness (generalized)  Other abnormalities of gait and mobility  Rationale for Evaluation and Treatment: Rehabilitation  ONSET DATE: 12/26/2022   SUBJECTIVE:  SUBJECTIVE STATEMENT: Patent reports both her knees are hurting today. She mainly just stayed in bed and watched TV this past weekend.   PERTINENT HISTORY: CVA, CKD, Osteoporosis, HTN  PAIN:  Are you having pain?  Yes: NPRS scale: 7/10 Worst: 10/10  Pain location: L knee;  Pain description: tight, sharp Aggravating factors: standing, walking, cooking Relieving factors: medication, rest  PRECAUTIONS: None  WEIGHT BEARING RESTRICTIONS: No  PATIENT GOALS: improve mobility in left knee in order to get around the home better and decrease pain   OBJECTIVE:  PATIENT SURVEYS:  FOTO: 49% function; 59% predicted   POSTURE: rounded shoulders and forward head  PALPATION: TTP to L distal quad  LOWER EXTREMITY ROM:  Active ROM Right eval Left eval Left 04/02/2023 Left 04/07/2023 Left 04/09/2023  Hip flexion       Hip extension       Hip abduction       Hip adduction       Hip internal rotation       Hip external rotation  Knee flexion  96 100 deg 104 deg 107 deg  Knee extension  11 Lacking 10 deg Lacking 8 deg Lacking 6 deg  Ankle dorsiflexion       Ankle plantarflexion       Ankle inversion       Ankle eversion        (Blank rows = not tested)  LOWER EXTREMITY MMT:  MMT Right eval Left eval  Hip flexion    Hip extension    Hip abduction    Hip adduction    Hip internal rotation    Hip external rotation    Knee flexion    Knee extension    Ankle dorsiflexion    Ankle plantarflexion    Ankle inversion    Ankle eversion     (Blank rows = not tested)  LOWER EXTREMITY SPECIAL TESTS:  DNT  FUNCTIONAL TESTS:  30 Second Sit to Stand: 7 reps - with UE TUG: 29 seconds with  FWW  GAIT: Distance walked: 51ft Assistive device utilized: Environmental consultant - 2 wheeled Level of assistance: Modified independence Comments: antalgic gait L, decreased L knee extension   TREATMENT: [facility] Adult PT Treatment:                                                DATE: 04/09/2023 Therapeutic Exercise: NuStep L5 x 6 min with UE/LE to improving knee motion SLR 2 x 10 SAQ with 2# 2 x 10 LAQ with 4# 3 x 10 Sit to stand from elevated table 2 x 10 Standing alternating march 2 x 20 Standing hip abduction 2 x 10 each Standing heel raises 2 x 10 Manual: Seated and supine knee mobs to improve knee flexion and extension Seated and supine knee PROM for flexion and extension Supine patellar mobs all directions   [facility] Adult PT Treatment:                                                DATE: 04/07/2023 Therapeutic Exercise: NuStep L5 x 5 min with UE/LE to improving knee motion Modified thomas stretch 3 x 30 sec Supine heel slide with strap 2 x 5 with 5 sec hold  SLR 2 x 10 LAQ with 4# 3 x 10 Sit to stand from elevated table 3 x 5 Standing heel raises 2 x 10 Walking with cane on right x 110 ft Manual: Seated and supine knee mobs to improve knee flexion and extension Seated and supine knee PROM for flexion and extension Supine patellar mobs all directions  [facility] Adult PT Treatment:                                                DATE: 04/02/2023 Therapeutic Exercise: NuStep L5 x 5 min with UE/LE to improving knee motion Supine heel slide with strap 2 x 5 with 5 sec hold  SLR 2 x 10 SAQ with 2# 2 x 10 LAQ with 2# 2 x 10 Sit to stand from elevated table 2 x 10 Seated hamstring stretch 2 x 20 sec Slant board calf stretch 3 x 20 sec  Walking in // bars with right UE support to simulate using cane x 4 lengths  Walking with cane on right in // bars x 4 lengths Manual: Seated and supine knee mobs to improve knee flexion and extension Seated and supine knee PROM for flexion and extension Supine  patellar mobs all directions  PATIENT EDUCATION:  Education details: HEP Person educated: Patient Education method: Programmer, multimedia, Demonstration Education comprehension: verbalized understanding and returned demonstration  HOME EXERCISE PROGRAM: Access Code: [email or web address]  ASSESSMENT: CLINICAL IMPRESSION: Patient tolerated therapy well with no adverse effects. She continues to demonstrate gradual improvement in her knee motion as noted above. Therapy continues to focus on improving her knee mobility and strength with good tolerance. Progressed her standing exercises this visit. She does report fatigue and increased soreness following therapy. No changes made to her HEP this visit. Patient would benefit from continued skilled PT to progress her mobility and strength in order to reduce pain and maximize functional ability.   OBJECTIVE IMPAIRMENTS: decreased activity tolerance, decreased balance, decreased mobility, difficulty walking, decreased ROM, decreased strength, and pain  ACTIVITY LIMITATIONS: sitting, standing, squatting, stairs, transfers, and locomotion level  PARTICIPATION LIMITATIONS: cleaning, driving, shopping, community activity, and yard work  PERSONAL FACTORS: Fitness and 3+ comorbidities: CVA, CKD, Osteoporosis, HTN  are also affecting patient's functional outcome.    GOALS: Goals reviewed with patient? No  SHORT TERM GOALS: Target date: 04/21/2023   Pt will be compliant and knowledgeable with initial HEP for improved comfort and carryover Baseline: initial HEP given  Goal status: INITIAL  2.  Pt will self report left knee pain no greater than 6/10 for improved comfort and functional ability Baseline: 10/10 at worst Goal status: INITIAL   LONG TERM GOALS: Target date: 06/09/2023   Pt will improve FOTO function score to no less than 59% as proxy for functional improvement Baseline: 49% function Goal status: INITIAL   2.  Pt will self report left knee pain no greater  than 3/10 for improved comfort and functional ability Baseline: 10/10 at worst Goal status: INITIAL   3.  Pt will increase 30 Second Sit to Stand rep count to no less than 9 reps (MCID 2 reps) for improved balance, strength, and functional mobility Baseline: 7 reps with UE Goal status: INITIAL   4.  Pt will decrease TUG time score to no greater than 18 seconds with LRAD for improved balance and functional mobility at home and community Baseline: 29 seconds with FWW Goal status: INITIAL  5.  Pt will improve L knee AROM to range of at least 0-115 degrees for improving functional mobility and community navigation post sugery Baseline: see ROM chart Goal status: INITIAL   PLAN: PT FREQUENCY: 2x/week  PT DURATION: 10 weeks  PLANNED INTERVENTIONS: Therapeutic exercises, Therapeutic activity, Neuromuscular re-education, Balance training, Gait training, Patient/Family education, Self Care, Joint mobilization, Dry Needling, Electrical stimulation, Cryotherapy, Moist heat, Vasopneumatic device, Manual therapy, and Re-evaluation  PLAN FOR NEXT SESSION: assess HEP response, L knee ROM, LE strengthening, gait training    Rosana Hoes, PT, DPT, LAT, ATC 04/09/23  4:22 PM Phone: [phone] Fax: [phone]

## 2023-04-13 ENCOUNTER — Ambulatory Visit (INDEPENDENT_AMBULATORY_CARE_PROVIDER_SITE_OTHER): Payer: Medicare HMO | Admitting: Orthopedic Surgery

## 2023-04-13 ENCOUNTER — Encounter: Payer: Self-pay | Admitting: Orthopedic Surgery

## 2023-04-13 DIAGNOSIS — Z96652 Presence of left artificial knee joint: Secondary | ICD-10-CM

## 2023-04-13 NOTE — Progress Notes (Signed)
Office Visit Note   Patient: Alison Whitehead           Date of Birth: May 20, 1951           MRN: [id number] Visit Date: 04/13/2023              Requested by: Burnice Logan, PA [address] [place],  [place] [address] PCP: Burnice Logan, [place]  Chief Complaint  Patient presents with   Left Knee - Follow-up    12/26/2022 left total knee replacement       HPI: Patient is a 72 year old woman who is 3-1/2 months status post left total knee arthroplasty.  She is still working with physical therapy.  Patient states that she needs a rollator type walker.  She is currently using a borrowed walker from her skilled nursing facility.  Assessment & Plan: Visit Diagnoses:  1. Status post total left knee replacement     Plan: A order was written for her to get a rollator type walker.  We will check and see if patient can obtain additional durable medical equipment device from her insurance.  Follow-Up Instructions: Return in about 4 weeks (around 05/11/2023).   Ortho Exam  Patient is alert, oriented, no adenopathy, well-dressed, normal affect, normal respiratory effort. Examination there is no effusion in the left knee.  She lacks about 5 degrees to full extension she is given instructions to work on knee extension.  Collateral ligaments are stable.  There is no cellulitis.  Imaging: No results found. No images are attached to the encounter.  Labs: Lab Results  Component Value Date   HGBA1C 6.1 (H) 12/22/2022   HGBA1C 6.2 (H) 10/02/2019   ESRSEDRATE 4 11/08/2007     Lab Results  Component Value Date   ALBUMIN 3.4 (L) 10/07/2019   ALBUMIN 3.5 10/01/2019   ALBUMIN 3.7 09/30/2019    No results found for: "MG" No results found for: "VD25OH"  No results found for: "PREALBUMIN"    Latest Ref Rng & Units 12/28/2022    7:03 AM 12/22/2022    9:30 AM 05/15/2020   12:26 PM  CBC EXTENDED  WBC 4.0 - 10.5 K/uL 8.6  5.5  4.9   RBC 3.87 - 5.11 MIL/uL 3.74  4.40  4.02    Hemoglobin 12.0 - 15.0 g/dL 84.1  66.0  63.0   HCT 36.0 - 46.0 % 33.8  40.5  37.9   Platelets 150 - 400 K/uL 176  261  221      There is no height or weight on file to calculate BMI.  Orders:  No orders of the defined types were placed in this encounter.  No orders of the defined types were placed in this encounter.    Procedures: No procedures performed  Clinical Data: No additional findings.  ROS:  All other systems negative, except as noted in the HPI. Review of Systems  Objective: Vital Signs: There were no vitals taken for this visit.  Specialty Comments:  No specialty comments available.  PMFS History: Patient Active Problem List   Diagnosis Date Noted   Total knee replacement status, 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   Rotator cuff syndrome, right 01/11/2020   Labile blood glucose    Vascular headache    Small vessel disease, cerebrovascular    Prediabetes    AKI (acute kidney injury) (HCC)    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   Acute cerebrovascular accident (CVA) (HCC) 09/30/2019   Unilateral primary osteoarthritis, left knee 03/17/2019  Unilateral primary osteoarthritis, right knee 03/17/2019   Bilateral primary osteoarthritis of knee 03/10/2019   INSOMNIA, CHRONIC 05/19/2007   SPONDYLOSIS, LUMBAR 05/19/2007   Essential hypertension 05/03/2007   ARTHRITIS, KNEE 05/03/2007   Past Medical History:  Diagnosis Date   Allergy    seasonal   Arthritis    CKD (chronic kidney disease)    DDD (degenerative disc disease)    Degenerative disc disease, lumbar    Hypertension    IBS (irritable bowel syndrome)    Osteopenia    Osteoporosis    osteopenia   Stroke (HCC) 09/2019    Family History  Problem Relation Age of Onset   Breast cancer Maternal Aunt    Diabetes Mother    Hypertension Mother    Glaucoma Mother    Other Father        accident   Diabetes Sister    Hypertension Sister    Cancer Sister    Colon cancer Neg Hx    Colon polyps Neg Hx     Esophageal cancer Neg Hx    Stomach cancer Neg Hx    Rectal cancer Neg Hx     Past Surgical History:  Procedure Laterality Date   SHOULDER SURGERY Left    lipoma   TOTAL KNEE ARTHROPLASTY Left 12/26/2022   Procedure: LEFT TOTAL KNEE ARTHROPLASTY;  Surgeon: Nadara Mustard, MD;  Location: [facility] OR;  Service: Orthopedics;  Laterality: Left;   Social History   Occupational History    Comment: retired  Tobacco Use   Smoking status: Former    Current packs/day: 0.00    Types: Cigarettes    Quit date: 10/13/2019    Years since quitting: 3.5   Smokeless tobacco: Never  Vaping Use   Vaping status: Never Used  Substance and Sexual Activity   Alcohol use: Never   Drug use: Never   Sexual activity: Not Currently    Birth control/protection: None

## 2023-04-14 ENCOUNTER — Other Ambulatory Visit: Payer: Self-pay

## 2023-04-14 ENCOUNTER — Encounter: Payer: Self-pay | Admitting: Physical Therapy

## 2023-04-14 ENCOUNTER — Ambulatory Visit: Payer: Medicare HMO | Admitting: Physical Therapy

## 2023-04-14 DIAGNOSIS — G8929 Other chronic pain: Secondary | ICD-10-CM

## 2023-04-14 DIAGNOSIS — M25562 Pain in left knee: Secondary | ICD-10-CM | POA: Diagnosis not present

## 2023-04-14 DIAGNOSIS — R2689 Other abnormalities of gait and mobility: Secondary | ICD-10-CM

## 2023-04-14 DIAGNOSIS — M6281 Muscle weakness (generalized): Secondary | ICD-10-CM

## 2023-04-14 NOTE — Therapy (Signed)
OUTPATIENT PHYSICAL THERAPY TREATMENT   Patient Name: Alison Whitehead MRN: [id number] DOB:[birth year], 72 y.o., female Today's Date: 04/14/2023   END OF SESSION:  PT End of Session - 04/14/23 1341     Visit Number 5    Number of Visits 20    Date for PT Re-Evaluation 06/09/23    Authorization Type [employer] MCR    Progress Note Due on Visit 10    PT Start Time 1150    PT Stop Time 1230    PT Time Calculation (min) 40 min    Activity Tolerance Patient tolerated treatment well    Behavior During Therapy WFL for tasks assessed/performed                 Past Medical History:  Diagnosis Date   Allergy    seasonal   Arthritis    CKD (chronic kidney disease)    DDD (degenerative disc disease)    Degenerative disc disease, lumbar    Hypertension    IBS (irritable bowel syndrome)    Osteopenia    Osteoporosis    osteopenia   Stroke (HCC) 09/2019   Past Surgical History:  Procedure Laterality Date   SHOULDER SURGERY Left    lipoma   TOTAL KNEE ARTHROPLASTY Left 12/26/2022   Procedure: LEFT TOTAL KNEE ARTHROPLASTY;  Surgeon: Nadara Mustard, MD;  Location: [facility] OR;  Service: Orthopedics;  Laterality: Left;   Patient Active Problem List   Diagnosis Date Noted   Total knee replacement status, 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   Rotator cuff syndrome, right 01/11/2020   Labile blood glucose    Vascular headache    Small vessel disease, cerebrovascular    Prediabetes    AKI (acute kidney injury) (HCC)    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   Acute cerebrovascular accident (CVA) (HCC) 09/30/2019   Unilateral primary osteoarthritis, left knee 03/17/2019   Unilateral primary osteoarthritis, right knee 03/17/2019   Bilateral primary osteoarthritis of knee 03/10/2019   INSOMNIA, CHRONIC 05/19/2007   SPONDYLOSIS, LUMBAR 05/19/2007   Essential hypertension 05/03/2007   ARTHRITIS, KNEE 05/03/2007    PCP: Burnice Logan, PA  REFERRING PROVIDER: Nadara Mustard, MD   REFERRING DIAG:  [phone] (ICD-10-CM) - Status post total left knee replacement   THERAPY DIAG:  Chronic pain of left knee  Muscle weakness (generalized)  Other abnormalities of gait and mobility  Rationale for Evaluation and Treatment: Rehabilitation  ONSET DATE: 12/26/2022   SUBJECTIVE:  SUBJECTIVE STATEMENT: Patent reports she is doing well, the right knee still hurts more than the left knee  PERTINENT HISTORY: CVA, CKD, Osteoporosis, HTN  PAIN:  Are you having pain?  Yes: NPRS scale: 7/10 Worst: 10/10  Pain location: L knee;  Pain description: tight, sharp Aggravating factors: standing, walking, cooking Relieving factors: medication, rest  PRECAUTIONS: None  WEIGHT BEARING RESTRICTIONS: No  PATIENT GOALS: improve mobility in left knee in order to get around the home better and decrease pain   OBJECTIVE:  PATIENT SURVEYS:  FOTO: 49% function; 59% predicted   POSTURE: rounded shoulders and forward head  PALPATION: TTP to L distal quad  LOWER EXTREMITY ROM:  Active ROM Right eval Left eval Left 04/02/2023 Left 04/07/2023 Left 04/09/2023 Left 04/14/2023  Hip flexion        Hip extension        Hip abduction        Hip adduction        Hip internal rotation        Hip external  rotation        Knee flexion  96 100 deg 104 deg 107 deg   Knee extension  11 Lacking 10 deg Lacking 8 deg Lacking 6 deg Lacking 5 deg  Ankle dorsiflexion        Ankle plantarflexion        Ankle inversion        Ankle eversion         (Blank rows = not tested)  LOWER EXTREMITY MMT:  MMT Right eval Left eval  Hip flexion    Hip extension    Hip abduction    Hip adduction    Hip internal rotation    Hip external rotation    Knee flexion    Knee extension    Ankle dorsiflexion    Ankle plantarflexion    Ankle inversion    Ankle eversion     (Blank rows = not tested)  LOWER EXTREMITY SPECIAL TESTS:  DNT  FUNCTIONAL TESTS:  30 Second Sit to Stand: 7 reps - with UE TUG: 29 seconds  with FWW  GAIT: Distance walked: 81ft Assistive device utilized: Environmental consultant - 2 wheeled Level of assistance: Modified independence Comments: antalgic gait L, decreased L knee extension   TREATMENT: [facility] Adult PT Treatment:                                                DATE: 04/14/2023 Therapeutic Exercise: NuStep L5 x 6 min with UE/LE to improving knee motion Quad set x 20 SLR 2 x 10 Supine heel slide with strap 10 x 5 sec Seated knee flexion stretch with opposite leg 10 x 5 sec LAQ with 4# 3 x 10 Standing knee flexion stretch with step 10 x 5 sec Sit to stand from elevated table 2 x 10 Manual: Seated and supine knee mobs to improve knee flexion and extension Seated and supine knee PROM for flexion and extension Supine patellar mobs all directions   [facility] Adult PT Treatment:                                                DATE: 04/09/2023 Therapeutic Exercise: NuStep L5 x 6 min with UE/LE to improving knee motion SLR 2 x 10 SAQ with 2# 2 x 10 LAQ with 4# 3 x 10 Sit to stand from elevated table 2 x 10 Standing alternating march 2 x 20 Standing hip abduction 2 x 10 each Standing heel raises 2 x 10 Manual: Seated and supine knee mobs to improve knee flexion and extension Seated and supine knee PROM for flexion and extension Supine patellar mobs all directions  [facility] Adult PT Treatment:                                                DATE: 04/07/2023 Therapeutic Exercise: NuStep L5 x 5 min with UE/LE to improving knee motion Modified thomas stretch 3 x 30 sec Supine heel slide with strap 2 x 5 with 5 sec hold  SLR 2 x 10 LAQ with 4# 3 x 10 Sit to stand from elevated table 3  x 5 Standing heel raises 2 x 10 Walking with cane on right x 110 ft Manual: Seated and supine knee mobs to improve knee flexion and extension Seated and supine knee PROM for flexion and extension Supine patellar mobs all directions  [facility] Adult PT Treatment:                                                 DATE: 04/02/2023 Therapeutic Exercise: NuStep L5 x 5 min with UE/LE to improving knee motion Supine heel slide with strap 2 x 5 with 5 sec hold  SLR 2 x 10 SAQ with 2# 2 x 10 LAQ with 2# 2 x 10 Sit to stand from elevated table 2 x 10 Seated hamstring stretch 2 x 20 sec Slant board calf stretch 3 x 20 sec Walking in // bars with right UE support to simulate using cane x 4 lengths  Walking with cane on right in // bars x 4 lengths Manual: Seated and supine knee mobs to improve knee flexion and extension Seated and supine knee PROM for flexion and extension Supine patellar mobs all directions  PATIENT EDUCATION:  Education details: HEP Person educated: Patient Education method: Programmer, multimedia, Demonstration Education comprehension: verbalized understanding and returned demonstration  HOME EXERCISE PROGRAM: Access Code: [email or web address]  ASSESSMENT: CLINICAL IMPRESSION: Patient tolerated therapy well with no adverse effects. Therapy focused on continued improvement of left knee mobility and strengthening with good tolerance. She does exhibit an improvement in her knee extension motion this visit. She does continue to report pain of the left knee with exercises and limitations due to the right knee pain. No changes made to her HEP this visit. Patient would benefit from continued skilled PT to progress her mobility and strength in order to reduce pain and maximize functional ability.   OBJECTIVE IMPAIRMENTS: decreased activity tolerance, decreased balance, decreased mobility, difficulty walking, decreased ROM, decreased strength, and pain  ACTIVITY LIMITATIONS: sitting, standing, squatting, stairs, transfers, and locomotion level  PARTICIPATION LIMITATIONS: cleaning, driving, shopping, community activity, and yard work  PERSONAL FACTORS: Fitness and 3+ comorbidities: CVA, CKD, Osteoporosis, HTN  are also affecting patient's functional outcome.    GOALS: Goals reviewed with patient? No  SHORT  TERM GOALS: Target date: 04/21/2023   Pt will be compliant and knowledgeable with initial HEP for improved comfort and carryover Baseline: initial HEP given  Goal status: INITIAL  2.  Pt will self report left knee pain no greater than 6/10 for improved comfort and functional ability Baseline: 10/10 at worst Goal status: INITIAL   LONG TERM GOALS: Target date: 06/09/2023   Pt will improve FOTO function score to no less than 59% as proxy for functional improvement Baseline: 49% function Goal status: INITIAL   2.  Pt will self report left knee pain no greater than 3/10 for improved comfort and functional ability Baseline: 10/10 at worst Goal status: INITIAL   3.  Pt will increase 30 Second Sit to Stand rep count to no less than 9 reps (MCID 2 reps) for improved balance, strength, and functional mobility Baseline: 7 reps with UE Goal status: INITIAL   4.  Pt will decrease TUG time score to no greater than 18 seconds with LRAD for improved balance and functional mobility at home and community Baseline: 29 seconds with FWW Goal status: INITIAL  5.  Pt will improve L  knee AROM to range of at least 0-115 degrees for improving functional mobility and community navigation post sugery Baseline: see ROM chart Goal status: INITIAL   PLAN: PT FREQUENCY: 2x/week  PT DURATION: 10 weeks  PLANNED INTERVENTIONS: Therapeutic exercises, Therapeutic activity, Neuromuscular re-education, Balance training, Gait training, Patient/Family education, Self Care, Joint mobilization, Dry Needling, Electrical stimulation, Cryotherapy, Moist heat, Vasopneumatic device, Manual therapy, and Re-evaluation  PLAN FOR NEXT SESSION: assess HEP response, L knee ROM, LE strengthening, gait training    Rosana Hoes, PT, DPT, LAT, ATC 04/14/23  1:57 PM Phone: [phone] Fax: [phone]

## 2023-04-15 ENCOUNTER — Ambulatory Visit
Admission: RE | Admit: 2023-04-15 | Discharge: 2023-04-15 | Disposition: A | Discharge status: Home / Self Care | Payer: Medicare HMO | Source: Ambulatory Visit | Attending: Nurse Practitioner | Admitting: Nurse Practitioner

## 2023-04-15 DIAGNOSIS — Z1231 Encounter for screening mammogram for malignant neoplasm of breast: Secondary | ICD-10-CM

## 2023-04-16 ENCOUNTER — Ambulatory Visit: Payer: Medicare HMO

## 2023-04-16 DIAGNOSIS — M25562 Pain in left knee: Secondary | ICD-10-CM | POA: Diagnosis not present

## 2023-04-16 DIAGNOSIS — R2689 Other abnormalities of gait and mobility: Secondary | ICD-10-CM

## 2023-04-16 DIAGNOSIS — G8929 Other chronic pain: Secondary | ICD-10-CM

## 2023-04-16 DIAGNOSIS — M6281 Muscle weakness (generalized): Secondary | ICD-10-CM

## 2023-04-16 NOTE — Therapy (Signed)
OUTPATIENT PHYSICAL THERAPY TREATMENT   Patient Name: Alison Whitehead MRN: [id number] DOB:19-Mar-1951, 72 y.o., female Today's Date: 04/16/2023   END OF SESSION:  PT End of Session - 04/16/23 1106     Visit Number 6    Number of Visits 20    Date for PT Re-Evaluation 06/09/23    Authorization Type [employer] MCR    Progress Note Due on Visit 10    PT Start Time 1102    PT Stop Time 1140    PT Time Calculation (min) 38 min    Activity Tolerance Patient tolerated treatment well    Behavior During Therapy WFL for tasks assessed/performed                  Past Medical History:  Diagnosis Date   Allergy    seasonal   Arthritis    CKD (chronic kidney disease)    DDD (degenerative disc disease)    Degenerative disc disease, lumbar    Hypertension    IBS (irritable bowel syndrome)    Osteopenia    Osteoporosis    osteopenia   Stroke (HCC) 09/2019   Past Surgical History:  Procedure Laterality Date   SHOULDER SURGERY Left    lipoma   TOTAL KNEE ARTHROPLASTY Left 12/26/2022   Procedure: LEFT TOTAL KNEE ARTHROPLASTY;  Surgeon: Nadara Mustard, MD;  Location: [facility] OR;  Service: Orthopedics;  Laterality: Left;   Patient Active Problem List   Diagnosis Date Noted   Total knee replacement status, 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   Rotator cuff syndrome, right 01/11/2020   Labile blood glucose    Vascular headache    Small vessel disease, cerebrovascular    Prediabetes    AKI (acute kidney injury) (HCC)    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   Acute cerebrovascular accident (CVA) (HCC) 09/30/2019   Unilateral primary osteoarthritis, left knee 03/17/2019   Unilateral primary osteoarthritis, right knee 03/17/2019   Bilateral primary osteoarthritis of knee 03/10/2019   INSOMNIA, CHRONIC 05/19/2007   SPONDYLOSIS, LUMBAR 05/19/2007   Essential hypertension 05/03/2007   ARTHRITIS, KNEE 05/03/2007    PCP: Burnice Logan, PA  REFERRING PROVIDER: Nadara Mustard, MD   REFERRING DIAG:  [id number] (ICD-10-CM) - Status post total left knee replacement   THERAPY DIAG:  Chronic pain of left knee  Muscle weakness (generalized)  Other abnormalities of gait and mobility  Rationale for Evaluation and Treatment: Rehabilitation  ONSET DATE: 12/26/2022   SUBJECTIVE:  SUBJECTIVE STATEMENT: Pt presents to PT with reports of continued knee pain. Has been compliant with HEP.  PERTINENT HISTORY: CVA, CKD, Osteoporosis, HTN  PAIN:  Are you having pain?  Yes: NPRS scale: 7/10 Worst: 10/10  Pain location: L knee;  Pain description: tight, sharp Aggravating factors: standing, walking, cooking Relieving factors: medication, rest  PRECAUTIONS: None  WEIGHT BEARING RESTRICTIONS: No  PATIENT GOALS: improve mobility in left knee in order to get around the home better and decrease pain   OBJECTIVE:  PATIENT SURVEYS:  FOTO: 49% function; 59% predicted   POSTURE: rounded shoulders and forward head  PALPATION: TTP to L distal quad  LOWER EXTREMITY ROM:  Active ROM Right eval Left eval Left 04/02/2023 Left 04/07/2023 Left 04/09/2023 Left 04/14/2023 Left 04/16/2023  Hip flexion         Hip extension         Hip abduction         Hip adduction         Hip internal rotation  Hip external rotation         Knee flexion  96 100 deg 104 deg 107 deg  104 deg  Knee extension  11 Lacking 10 deg Lacking 8 deg Lacking 6 deg Lacking 5 deg Lacking 4 deg  Ankle dorsiflexion         Ankle plantarflexion         Ankle inversion         Ankle eversion          (Blank rows = not tested)  LOWER EXTREMITY MMT:  MMT Right eval Left eval  Hip flexion    Hip extension    Hip abduction    Hip adduction    Hip internal rotation    Hip external rotation    Knee flexion    Knee extension    Ankle dorsiflexion    Ankle plantarflexion    Ankle inversion    Ankle eversion     (Blank rows = not tested)  LOWER EXTREMITY SPECIAL TESTS:  DNT  FUNCTIONAL TESTS:  30 Second  Sit to Stand: 7 reps - with UE TUG: 29 seconds with FWW  GAIT: Distance walked: 19ft Assistive device utilized: Environmental consultant - 2 wheeled Level of assistance: Modified independence Comments: antalgic gait L, decreased L knee extension   TREATMENT: [facility] Adult PT Treatment:                                                DATE: 04/16/2023 Therapeutic Exercise: NuStep L5 x 6 min with UE/LE to improving knee motion Supine heel slide with strap 2x10 - 5" hold Quad set x 15 - 5  SLR 2x10 L LAQ 2x10 L 4# Seated hamstring stretch 2x30" L Seated heel slide 2x10 - 5" hold Standing knee flexion stretch on step x 10 - 5" hold Manual: Seated and supine knee mobs to improve knee flexion and extension Seated and supine knee PROM for flexion and extension Supine patellar mobs all directions  [facility] Adult PT Treatment:                                                DATE: 04/14/2023 Therapeutic Exercise: NuStep L5 x 6 min with UE/LE to improving knee motion Quad set x 20 SLR 2 x 10 Supine heel slide with strap 10 x 5 sec Seated knee flexion stretch with opposite leg 10 x 5 sec LAQ with 4# 3 x 10 Standing knee flexion stretch with step 10 x 5 sec Sit to stand from elevated table 2 x 10 Manual: Seated and supine knee mobs to improve knee flexion and extension Seated and supine knee PROM for flexion and extension Supine patellar mobs all directions   [facility] Adult PT Treatment:                                                DATE: 04/09/2023 Therapeutic Exercise: NuStep L5 x 6 min with UE/LE to improving knee motion SLR 2 x 10 SAQ with 2# 2 x 10 LAQ with 4# 3 x 10 Sit to stand from elevated table 2  x 10 Standing alternating march 2 x 20 Standing hip abduction 2 x 10 each Standing heel raises 2 x 10 Manual: Seated and supine knee mobs to improve knee flexion and extension Seated and supine knee PROM for flexion and extension Supine patellar mobs all directions  PATIENT EDUCATION:  Education details:  HEP Person educated: Patient Education method: Programmer, multimedia, Demonstration Education comprehension: verbalized understanding and returned demonstration  HOME EXERCISE PROGRAM: Access Code: [email or web address]  ASSESSMENT: CLINICAL IMPRESSION: Pt was able to complete all prescribed exercises but continued to be limited by pain. Continues to slowly progress ROM for L knee post surgery. Pt continues to require skilled PT services, will continue to progress as tolerated per POC.    OBJECTIVE IMPAIRMENTS: decreased activity tolerance, decreased balance, decreased mobility, difficulty walking, decreased ROM, decreased strength, and pain  ACTIVITY LIMITATIONS: sitting, standing, squatting, stairs, transfers, and locomotion level  PARTICIPATION LIMITATIONS: cleaning, driving, shopping, community activity, and yard work  PERSONAL FACTORS: Fitness and 3+ comorbidities: CVA, CKD, Osteoporosis, HTN  are also affecting patient's functional outcome.    GOALS: Goals reviewed with patient? No  SHORT TERM GOALS: Target date: 04/21/2023   Pt will be compliant and knowledgeable with initial HEP for improved comfort and carryover Baseline: initial HEP given  Goal status: INITIAL  2.  Pt will self report left knee pain no greater than 6/10 for improved comfort and functional ability Baseline: 10/10 at worst Goal status: INITIAL   LONG TERM GOALS: Target date: 06/09/2023   Pt will improve FOTO function score to no less than 59% as proxy for functional improvement Baseline: 49% function Goal status: INITIAL   2.  Pt will self report left knee pain no greater than 3/10 for improved comfort and functional ability Baseline: 10/10 at worst Goal status: INITIAL   3.  Pt will increase 30 Second Sit to Stand rep count to no less than 9 reps (MCID 2 reps) for improved balance, strength, and functional mobility Baseline: 7 reps with UE Goal status: INITIAL   4.  Pt will decrease TUG time score to no greater than  18 seconds with LRAD for improved balance and functional mobility at home and community Baseline: 29 seconds with FWW Goal status: INITIAL  5.  Pt will improve L knee AROM to range of at least 0-115 degrees for improving functional mobility and community navigation post sugery Baseline: see ROM chart Goal status: INITIAL   PLAN: PT FREQUENCY: 2x/week  PT DURATION: 10 weeks  PLANNED INTERVENTIONS: Therapeutic exercises, Therapeutic activity, Neuromuscular re-education, Balance training, Gait training, Patient/Family education, Self Care, Joint mobilization, Dry Needling, Electrical stimulation, Cryotherapy, Moist heat, Vasopneumatic device, Manual therapy, and Re-evaluation  PLAN FOR NEXT SESSION: assess HEP response, L knee ROM, LE strengthening, gait training    Eloy End PT  04/16/23 11:42 AM

## 2023-04-17 ENCOUNTER — Telehealth: Payer: Self-pay | Admitting: Orthopedic Surgery

## 2023-04-17 NOTE — Telephone Encounter (Signed)
Pt called in stating she would like pain medication for left knee to [facility] on file

## 2023-04-21 ENCOUNTER — Ambulatory Visit: Payer: Medicare HMO

## 2023-04-21 MED ORDER — HYDROCODONE-ACETAMINOPHEN 10-325 MG PO TABS: 1.0000 | ORAL_TABLET | Freq: Four times a day (QID) | ORAL | 0 refills | Status: DC | PRN

## 2023-04-21 NOTE — Addendum Note (Signed)
Addended by: Adonis Huguenin on: 04/21/2023 08:56 AM   Modules accepted: Orders

## 2023-04-23 ENCOUNTER — Ambulatory Visit: Payer: Medicare HMO | Admitting: Physical Therapy

## 2023-04-23 ENCOUNTER — Other Ambulatory Visit: Payer: Self-pay

## 2023-04-23 ENCOUNTER — Encounter: Payer: Self-pay | Admitting: Physical Therapy

## 2023-04-23 DIAGNOSIS — M6281 Muscle weakness (generalized): Secondary | ICD-10-CM

## 2023-04-23 DIAGNOSIS — R2689 Other abnormalities of gait and mobility: Secondary | ICD-10-CM

## 2023-04-23 DIAGNOSIS — G8929 Other chronic pain: Secondary | ICD-10-CM

## 2023-04-23 DIAGNOSIS — M25562 Pain in left knee: Secondary | ICD-10-CM | POA: Diagnosis not present

## 2023-04-23 NOTE — Therapy (Signed)
OUTPATIENT PHYSICAL THERAPY TREATMENT   Patient Name: Alison Whitehead MRN: [id number] DOB:07-22-1951, 72 y.o., female Today's Date: 04/23/2023   END OF SESSION:  PT End of Session - 04/23/23 1214     Visit Number 7    Number of Visits 20    Date for PT Re-Evaluation 06/09/23    Authorization Type [employer] M[employer]    Progress Note Due on Visit 10    PT Start Time 1153    PT Stop Time 1231    PT Time Calculation (min) 38 min    Activity Tolerance Patient tolerated treatment well    Behavior During Therapy WFL for tasks assessed/performed                   Past Medical History:  Diagnosis Date   Allergy    seasonal   Arthritis    CKD (chronic kidney disease)    DDD (degenerative disc disease)    Degenerative disc disease, lumbar    Hypertension    IBS (irritable bowel syndrome)    Osteopenia    Osteoporosis    osteopenia   Stroke (HCC) 09/2019   Past Surgical History:  Procedure Laterality Date   SHOULDER SURGERY Left    lipoma   TOTAL KNEE ARTHROPLASTY Left 12/26/2022   Procedure: LEFT TOTAL KNEE ARTHROPLASTY;  Surgeon: Nadara Mustard, MD;  Location: [facility] OR;  Service: Orthopedics;  Laterality: Left;   Patient Active Problem List   Diagnosis Date Noted   Total knee replacement status, 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   Rotator cuff syndrome, right 01/11/2020   Labile blood glucose    Vascular headache    Small vessel disease, cerebrovascular    Prediabetes    AKI (acute kidney injury) (HCC)    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   Acute cerebrovascular accident (CVA) (HCC) 09/30/2019   Unilateral primary osteoarthritis, left knee 03/17/2019   Unilateral primary osteoarthritis, right knee 03/17/2019   Bilateral primary osteoarthritis of knee 03/10/2019   INSOMNIA, CHRONIC 05/19/2007   SPONDYLOSIS, LUMBAR 05/19/2007   Essential hypertension 05/03/2007   ARTHRITIS, KNEE 05/03/2007    PCP: Burnice Logan, PA  REFERRING PROVIDER: Nadara Mustard, MD   REFERRING  DIAG: [phone] (ICD-10-CM) - Status post total left knee replacement   THERAPY DIAG:  Chronic pain of left knee  Muscle weakness (generalized)  Other abnormalities of gait and mobility  Rationale for Evaluation and Treatment: Rehabilitation  ONSET DATE: 12/26/2022   SUBJECTIVE:  SUBJECTIVE STATEMENT: Patient arrives reporting increased pain this visit, especially the right knee that limits her more than the left knee does.   PERTINENT HISTORY: CVA, CKD, Osteoporosis, HTN  PAIN:  Are you having pain?  Yes: NPRS scale: 8/10 Worst: 10/10  Pain location: Left knee;  Pain description: Tight, sharp Aggravating factors: Standing, walking, cooking Relieving factors: Medication, rest  PRECAUTIONS: None  WEIGHT BEARING RESTRICTIONS: No  PATIENT GOALS: improve mobility in left knee in order to get around the home better and decrease pain   OBJECTIVE:  PATIENT SURVEYS:  FOTO: 49% function; 59% predicted   POSTURE: rounded shoulders and forward head  PALPATION: TTP to L distal quad  LOWER EXTREMITY ROM:  Active ROM Right eval Left eval Left 04/02/2023 Left 04/07/2023 Left 04/09/2023 Left 04/14/2023 Left 04/16/2023 Left 04/23/2023  Hip flexion          Hip extension          Hip abduction          Hip adduction  Hip internal rotation          Hip external rotation          Knee flexion  96 100 deg 104 deg 107 deg  104 deg 107 PROM  Knee extension  11 Lacking 10 deg Lacking 8 deg Lacking 6 deg Lacking 5 deg Lacking 4 deg   Ankle dorsiflexion          Ankle plantarflexion          Ankle inversion          Ankle eversion           (Blank rows = not tested)  LOWER EXTREMITY MMT:  MMT Right eval Left eval  Hip flexion    Hip extension    Hip abduction    Hip adduction    Hip internal rotation    Hip external rotation    Knee flexion    Knee extension    Ankle dorsiflexion    Ankle plantarflexion    Ankle inversion    Ankle eversion     (Blank rows  = not tested)  LOWER EXTREMITY SPECIAL TESTS:  DNT  FUNCTIONAL TESTS:  30 Second Sit to Stand: 7 reps - with UE TUG: 29 seconds with FWW  GAIT: Distance walked: 84ft Assistive device utilized: Environmental consultant - 2 wheeled Level of assistance: Modified independence Comments: antalgic gait L, decreased L knee extension   TREATMENT: [facility] Adult PT Treatment:                                                DATE: 04/23/2023 Therapeutic Exercise: Supine heel slide with strap 10 x 5 sec hold Quad set 2 x 10 with 5 sec hold SAQ with 3# 2 x 10 on left SLR 2 x 10 on left LAQ with 4# 2 x 10 on left  Sit to stand 2 x 10 from elevated table  Sidelying hip abduction 2 x 10 Manual: Seated and supine knee mobs to improve knee flexion and extension Seated and supine knee PROM for flexion and extension Supine patellar mobs all directions   [facility] Adult PT Treatment:                                                DATE: 04/16/2023 Therapeutic Exercise: NuStep L5 x 6 min with UE/LE to improving knee motion Supine heel slide with strap 2x10 - 5" hold Quad set x 15 - 5  SLR 2x10 L LAQ 2x10 L 4# Seated hamstring stretch 2x30" L Seated heel slide 2x10 - 5" hold Standing knee flexion stretch on step x 10 - 5" hold Manual: Seated and supine knee mobs to improve knee flexion and extension Seated and supine knee PROM for flexion and extension Supine patellar mobs all directions  [facility] Adult PT Treatment:                                                DATE: 04/14/2023 Therapeutic Exercise: NuStep L5 x 6 min with UE/LE to improving knee motion Quad set x 20 SLR 2 x 10 Supine  heel slide with strap 10 x 5 sec Seated knee flexion stretch with opposite leg 10 x 5 sec LAQ with 4# 3 x 10 Standing knee flexion stretch with step 10 x 5 sec Sit to stand from elevated table 2 x 10 Manual: Seated and supine knee mobs to improve knee flexion and extension Seated and supine knee PROM for flexion and extension Supine  patellar mobs all directions  [facility] Adult PT Treatment:                                                DATE: 04/09/2023 Therapeutic Exercise: NuStep L5 x 6 min with UE/LE to improving knee motion SLR 2 x 10 SAQ with 2# 2 x 10 LAQ with 4# 3 x 10 Sit to stand from elevated table 2 x 10 Standing alternating march 2 x 20 Standing hip abduction 2 x 10 each Standing heel raises 2 x 10 Manual: Seated and supine knee mobs to improve knee flexion and extension Seated and supine knee PROM for flexion and extension Supine patellar mobs all directions  PATIENT EDUCATION:  Education details: HEP Person educated: Patient Education method: Programmer, multimedia, Demonstration Education comprehension: verbalized understanding and returned demonstration  HOME EXERCISE PROGRAM: Access Code: [email or web address]   ASSESSMENT: CLINICAL IMPRESSION: Patient tolerated therapy well with no adverse effects. Therapy focused on continued progressing of left knee mobility and strengthening. She did arrive reporting increased right more than left knee pain. She does exhibit slight improvement in her left knee flexion this visit. Progress with primarily mat based strengthening but she is able to tolerate increased resistance with exercises. Limited with standing exercises this visit due to right knee pain. No changed made to her HEP. Patient would benefit from continued skilled PT to progress her mobility and strength in order to reduce pain and maximize functional ability.    OBJECTIVE IMPAIRMENTS: decreased activity tolerance, decreased balance, decreased mobility, difficulty walking, decreased ROM, decreased strength, and pain  ACTIVITY LIMITATIONS: sitting, standing, squatting, stairs, transfers, and locomotion level  PARTICIPATION LIMITATIONS: cleaning, driving, shopping, community activity, and yard work  PERSONAL FACTORS: Fitness and 3+ comorbidities: CVA, CKD, Osteoporosis, HTN  are also affecting patient's functional  outcome.    GOALS: Goals reviewed with patient? No  SHORT TERM GOALS: Target date: 04/21/2023   Pt will be compliant and knowledgeable with initial HEP for improved comfort and carryover Baseline: initial HEP given  Goal status: INITIAL  2.  Pt will self report left knee pain no greater than 6/10 for improved comfort and functional ability Baseline: 10/10 at worst Goal status: INITIAL   LONG TERM GOALS: Target date: 06/09/2023   Pt will improve FOTO function score to no less than 59% as proxy for functional improvement Baseline: 49% function Goal status: INITIAL   2.  Pt will self report left knee pain no greater than 3/10 for improved comfort and functional ability Baseline: 10/10 at worst Goal status: INITIAL   3.  Pt will increase 30 Second Sit to Stand rep count to no less than 9 reps (MCID 2 reps) for improved balance, strength, and functional mobility Baseline: 7 reps with UE Goal status: INITIAL   4.  Pt will decrease TUG time score to no greater than 18 seconds with LRAD for improved balance and functional mobility at home and community Baseline: 29 seconds with FWW Goal status: INITIAL  5.  Pt will improve L knee AROM to range of at least 0-115 degrees for improving functional mobility and community navigation post sugery Baseline: see ROM chart Goal status: INITIAL   PLAN: PT FREQUENCY: 2x/week  PT DURATION: 10 weeks  PLANNED INTERVENTIONS: Therapeutic exercises, Therapeutic activity, Neuromuscular re-education, Balance training, Gait training, Patient/Family education, Self Care, Joint mobilization, Dry Needling, Electrical stimulation, Cryotherapy, Moist heat, Vasopneumatic device, Manual therapy, and Re-evaluation  PLAN FOR NEXT SESSION: assess HEP response, L knee ROM, LE strengthening, gait training    Rosana Hoes, PT, DPT, LAT, ATC 04/23/23  12:35 PM Phone: [phone] Fax: [phone]

## 2023-04-29 ENCOUNTER — Ambulatory Visit: Payer: Medicare HMO | Admitting: Physical Therapy

## 2023-04-29 ENCOUNTER — Encounter: Payer: Self-pay | Admitting: Physical Therapy

## 2023-04-29 DIAGNOSIS — G8929 Other chronic pain: Secondary | ICD-10-CM

## 2023-04-29 DIAGNOSIS — M6281 Muscle weakness (generalized): Secondary | ICD-10-CM

## 2023-04-29 DIAGNOSIS — R2689 Other abnormalities of gait and mobility: Secondary | ICD-10-CM

## 2023-04-29 DIAGNOSIS — M25562 Pain in left knee: Secondary | ICD-10-CM | POA: Diagnosis not present

## 2023-04-29 NOTE — Therapy (Signed)
OUTPATIENT PHYSICAL THERAPY TREATMENT   Patient Name: Alison Whitehead MRN: [id number] DOB:1951-06-08, 72 y.o., female Today's Date: 04/29/2023   END OF SESSION:  PT End of Session - 04/29/23 1459     Visit Number 8    Number of Visits 20    Date for PT Re-Evaluation 06/09/23    Authorization Type Aetna [facility]    PT Start Time 0255   pt arrives late   PT Stop Time 0332    PT Time Calculation (min) 37 min                   Past Medical History:  Diagnosis Date   Allergy    seasonal   Arthritis    CKD (chronic kidney disease)    DDD (degenerative disc disease)    Degenerative disc disease, lumbar    Hypertension    IBS (irritable bowel syndrome)    Osteopenia    Osteoporosis    osteopenia   Stroke (HCC) 09/2019   Past Surgical History:  Procedure Laterality Date   SHOULDER SURGERY Left    lipoma   TOTAL KNEE ARTHROPLASTY Left 12/26/2022   Procedure: LEFT TOTAL KNEE ARTHROPLASTY;  Surgeon: Nadara Mustard, MD;  Location: [facility] OR;  Service: Orthopedics;  Laterality: Left;   Patient Active Problem List   Diagnosis Date Noted   Total knee replacement status, 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   Rotator cuff syndrome, right 01/11/2020   Labile blood glucose    Vascular headache    Small vessel disease, cerebrovascular    Prediabetes    AKI (acute kidney injury) (HCC)    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   Acute cerebrovascular accident (CVA) (HCC) 09/30/2019   Unilateral primary osteoarthritis, left knee 03/17/2019   Unilateral primary osteoarthritis, right knee 03/17/2019   Bilateral primary osteoarthritis of knee 03/10/2019   INSOMNIA, CHRONIC 05/19/2007   SPONDYLOSIS, LUMBAR 05/19/2007   Essential hypertension 05/03/2007   ARTHRITIS, KNEE 05/03/2007    PCP: Burnice Logan, PA  REFERRING PROVIDER: Nadara Mustard, MD   REFERRING DIAG: [phone] (ICD-10-CM) - Status post total left knee replacement   THERAPY DIAG:  Chronic pain of left knee  Muscle weakness  (generalized)  Other abnormalities of gait and mobility  Rationale for Evaluation and Treatment: Rehabilitation  ONSET DATE: 12/26/2022   SUBJECTIVE:  SUBJECTIVE STATEMENT: Patient arrives reporting increased pain this visit in left knee after fall going down the stairs, landing on knee.    PERTINENT HISTORY: CVA, CKD, Osteoporosis, HTN  PAIN:  Are you having pain?  Yes: NPRS scale: 10/10 Worst: 10/10  Pain location: Left knee;  Pain description: Tight, sharp Aggravating factors: Standing, walking, cooking Relieving factors: Medication, rest  PRECAUTIONS: None  WEIGHT BEARING RESTRICTIONS: No  PATIENT GOALS: improve mobility in left knee in order to get around the home better and decrease pain   OBJECTIVE:  PATIENT SURVEYS:  FOTO: 49% function; 59% predicted   POSTURE: rounded shoulders and forward head  PALPATION: TTP to L distal quad  LOWER EXTREMITY ROM:  Active ROM Right eval Left eval Left 04/02/2023 Left 04/07/2023 Left 04/09/2023 Left 04/14/2023 Left 04/16/2023 Left 04/23/2023 Left  04/29/23  Hip flexion           Hip extension           Hip abduction           Hip adduction           Hip internal rotation  Hip external rotation           Knee flexion  96 100 deg 104 deg 107 deg  104 deg 107 PROM 75  Knee extension  11 Lacking 10 deg Lacking 8 deg Lacking 6 deg Lacking 5 deg Lacking 4 deg  15  Ankle dorsiflexion           Ankle plantarflexion           Ankle inversion           Ankle eversion            (Blank rows = not tested)  LOWER EXTREMITY MMT:  MMT Right eval Left eval  Hip flexion    Hip extension    Hip abduction    Hip adduction    Hip internal rotation    Hip external rotation    Knee flexion    Knee extension    Ankle dorsiflexion    Ankle plantarflexion    Ankle inversion    Ankle eversion     (Blank rows = not tested)  LOWER EXTREMITY SPECIAL TESTS:  DNT  FUNCTIONAL TESTS:  30 Second Sit to Stand: 7 reps  - with UE TUG: 29 seconds with FWW  GAIT: Distance walked: 20ft Assistive device utilized: Environmental consultant - 2 wheeled Level of assistance: Modified independence Comments: antalgic gait L, decreased L knee extension   TREATMENT: [facility] Adult PT Treatment:                                                DATE: 04/29/23 Therapeutic Exercise: Passive knee flexion and extension SAQ  Heel slides Seated heel slides Calf stretch with strap seated  LAQ /heel slides  Seated heel toe raises   Modalities: Ice pack left knee     [facility] Adult PT Treatment:                                                DATE: 04/23/2023 Therapeutic Exercise: Supine heel slide with strap 10 x 5 sec hold Quad set 2 x 10 with 5 sec hold SAQ with 3# 2 x 10 on left SLR 2 x 10 on left LAQ with 4# 2 x 10 on left  Sit to stand 2 x 10 from elevated table  Sidelying hip abduction 2 x 10 Manual: Seated and supine knee mobs to improve knee flexion and extension Seated and supine knee PROM for flexion and extension Supine patellar mobs all directions   [facility] Adult PT Treatment:                                                DATE: 04/16/2023 Therapeutic Exercise: NuStep L5 x 6 min with UE/LE to improving knee motion Supine heel slide with strap 2x10 - 5" hold Quad set x 15 - 5  SLR 2x10 L LAQ 2x10 L 4# Seated hamstring stretch 2x30" L Seated heel slide 2x10 - 5" hold Standing knee flexion stretch on step x 10 - 5" hold Manual: Seated and supine knee mobs to improve knee flexion and extension Seated and supine  knee PROM for flexion and extension Supine patellar mobs all directions   PATIENT EDUCATION:  Education details: HEP Person educated: Patient Education method: Programmer, multimedia, Demonstration Education comprehension: verbalized understanding and returned demonstration  HOME EXERCISE PROGRAM: Access Code: [email or web address]   ASSESSMENT: CLINICAL IMPRESSION: Pt had a fall on Monday on the back porch at her boyfriend's  house when she slipped going down the stairs and hit her left knee. Today she demonstrates increased knee pain at 10/10 located at anterior and posterior knee. Her AROM has decreased. Her ROM and tolerance to therex did improve during her session and she felt better after ice. She has not iced her knee since she fell. She was asked to call MD and apply ice, and perform gentle ROM. Patient would benefit from continued skilled PT to progress her mobility and strength in order to reduce pain and maximize functional ability.    OBJECTIVE IMPAIRMENTS: decreased activity tolerance, decreased balance, decreased mobility, difficulty walking, decreased ROM, decreased strength, and pain  ACTIVITY LIMITATIONS: sitting, standing, squatting, stairs, transfers, and locomotion level  PARTICIPATION LIMITATIONS: cleaning, driving, shopping, community activity, and yard work  PERSONAL FACTORS: Fitness and 3+ comorbidities: CVA, CKD, Osteoporosis, HTN  are also affecting patient's functional outcome.    GOALS: Goals reviewed with patient? No  SHORT TERM GOALS: Target date: 04/21/2023   Pt will be compliant and knowledgeable with initial HEP for improved comfort and carryover Baseline: initial HEP given  Goal status: ONGOING  2.  Pt will self report left knee pain no greater than 6/10 for improved comfort and functional ability Baseline: 10/10 at worst Goal status: ONGOING  LONG TERM GOALS: Target date: 06/09/2023   Pt will improve FOTO function score to no less than 59% as proxy for functional improvement Baseline: 49% function Goal status: INITIAL   2.  Pt will self report left knee pain no greater than 3/10 for improved comfort and functional ability Baseline: 10/10 at worst Goal status: INITIAL   3.  Pt will increase 30 Second Sit to Stand rep count to no less than 9 reps (MCID 2 reps) for improved balance, strength, and functional mobility Baseline: 7 reps with UE Goal status: INITIAL   4.  Pt  will decrease TUG time score to no greater than 18 seconds with LRAD for improved balance and functional mobility at home and community Baseline: 29 seconds with FWW Goal status: INITIAL  5.  Pt will improve L knee AROM to range of at least 0-115 degrees for improving functional mobility and community navigation post sugery Baseline: see ROM chart Goal status: INITIAL   PLAN: PT FREQUENCY: 2x/week  PT DURATION: 10 weeks  PLANNED INTERVENTIONS: Therapeutic exercises, Therapeutic activity, Neuromuscular re-education, Balance training, Gait training, Patient/Family education, Self Care, Joint mobilization, Dry Needling, Electrical stimulation, Cryotherapy, Moist heat, Vasopneumatic device, Manual therapy, and Re-evaluation  PLAN FOR NEXT SESSION: assess HEP response, L knee ROM, LE strengthening, gait training   Jannette Spanner, PTA 04/29/23 3:31 PM Phone: [phone] Fax: [phone]

## 2023-05-05 ENCOUNTER — Other Ambulatory Visit (INDEPENDENT_AMBULATORY_CARE_PROVIDER_SITE_OTHER): Payer: Medicare HMO

## 2023-05-05 ENCOUNTER — Ambulatory Visit (INDEPENDENT_AMBULATORY_CARE_PROVIDER_SITE_OTHER): Payer: Medicare HMO | Admitting: Orthopedic Surgery

## 2023-05-05 DIAGNOSIS — M25561 Pain in right knee: Secondary | ICD-10-CM | POA: Diagnosis not present

## 2023-05-05 DIAGNOSIS — M25562 Pain in left knee: Secondary | ICD-10-CM

## 2023-05-05 DIAGNOSIS — G8929 Other chronic pain: Secondary | ICD-10-CM

## 2023-05-06 ENCOUNTER — Telehealth: Payer: Self-pay | Admitting: Orthopedic Surgery

## 2023-05-06 ENCOUNTER — Other Ambulatory Visit: Payer: Self-pay | Admitting: Orthopedic Surgery

## 2023-05-06 ENCOUNTER — Encounter: Payer: Self-pay | Admitting: Orthopedic Surgery

## 2023-05-06 DIAGNOSIS — G8929 Other chronic pain: Secondary | ICD-10-CM

## 2023-05-06 DIAGNOSIS — M25561 Pain in right knee: Secondary | ICD-10-CM | POA: Diagnosis not present

## 2023-05-06 MED ORDER — METHYLPREDNISOLONE ACETATE 40 MG/ML IJ SUSP
40.0000 mg | INTRAMUSCULAR | Status: AC | PRN
Start: 2023-05-06 — End: 2023-05-06
  Administered 2023-05-06: 40 mg via INTRA_ARTICULAR

## 2023-05-06 MED ORDER — LIDOCAINE HCL (PF) 1 % IJ SOLN
5.0000 mL | INTRAMUSCULAR | Status: AC | PRN
Start: 2023-05-06 — End: 2023-05-06
  Administered 2023-05-06: 5 mL

## 2023-05-06 MED ORDER — CYCLOBENZAPRINE HCL 5 MG PO TABS: 5.0000 mg | ORAL_TABLET | Freq: Three times a day (TID) | ORAL | 0 refills | Status: DC | PRN

## 2023-05-06 MED ORDER — DICLOFENAC SODIUM 1 % EX GEL: 2.0000 g | Freq: Four times a day (QID) | CUTANEOUS | Status: DC

## 2023-05-06 NOTE — Telephone Encounter (Signed)
Patient called asked if she can get a Rx sent to her pharmacy for Diclofenac. Patient said she would not have to pay OOP if it's sent in as a Rx. The number to contact patient is [phone]

## 2023-05-06 NOTE — Telephone Encounter (Signed)
Patient called advised she called the pharmacy and the Rx for Cyclobenzaprine and Diclofenac was not received at the pharmacy yet. Patient said she uses [facility] [address]. The number to contact patient is [phone]

## 2023-05-06 NOTE — Progress Notes (Signed)
Office Visit Note   Patient: Alison Whitehead           Date of Birth: 01-May-1951           MRN: [id number] Visit Date: 05/05/2023              Requested by: Burnice Logan, PA [address] [place],  [place] [address] PCP: Burnice Logan, [place]  Chief Complaint  Patient presents with   Left Knee - Pain      HPI: Patient is a 72 year old woman who presents with left knee pain status post total knee arthroplasty with acute fall on her left knee.  Patient states she fell 2 weeks ago ago and landed on her left knee.  Patient states she has pain with ambulation and currently uses a walker.  Patient has had injections with Dr. Cleophas Dunker in the past.  Assessment & Plan: Visit Diagnoses:  1. Acute pain of left knee   2. Chronic pain of right knee     Plan: A prescription was called in for Flexeril discussed that she can obtain Voltaren gel over-the-counter.  Patient will call if she wants to consider total knee arthroplasty on the right.  Follow-Up Instructions: No follow-ups on file.   Ortho Exam  Patient is alert, oriented, no adenopathy, well-dressed, normal affect, normal respiratory effort. Examination of the left knee patient has good range of motion there is no open wounds no ecchymosis or bruising.  Varus and valgus stress is stable.  Examination the right knee she is tender to palpation in the patellofemoral joint as well as medial lateral joint line.  There is crepitation with range of motion.  She has varus alignment of the right knee.  Imaging: XR Knee 1-2 Views Left  Result Date: 05/06/2023 2 view radiographs of the left knee shows a stable total knee arthroplasty without loosening and a congruent joint.  No images are attached to the encounter.  Labs: Lab Results  Component Value Date   HGBA1C 6.1 (H) 12/22/2022   HGBA1C 6.2 (H) 10/02/2019   ESRSEDRATE 4 11/08/2007     Lab Results  Component Value Date   ALBUMIN 3.4 (L) 10/07/2019   ALBUMIN  3.5 10/01/2019   ALBUMIN 3.7 09/30/2019    No results found for: "MG" No results found for: "VD25OH"  No results found for: "PREALBUMIN"    Latest Ref Rng & Units 12/28/2022    7:03 AM 12/22/2022    9:30 AM 05/15/2020   12:26 PM  CBC EXTENDED  WBC 4.0 - 10.5 K/uL 8.6  5.5  4.9   RBC 3.87 - 5.11 MIL/uL 3.74  4.40  4.02   Hemoglobin 12.0 - 15.0 g/dL 78.2  95.6  21.3   HCT 36.0 - 46.0 % 33.8  40.5  37.9   Platelets 150 - 400 K/uL 176  261  221      There is no height or weight on file to calculate BMI.  Orders:  Orders Placed This Encounter  Procedures   XR Knee 1-2 Views Left   XR Knee 1-2 Views Right   No orders of the defined types were placed in this encounter.    Procedures: Large Joint Inj: R knee on 05/06/2023 3:22 PM Indications: pain and diagnostic evaluation Details: 22 G 1.5 in needle, anteromedial approach  Arthrogram: No  Medications: 5 mL lidocaine (PF) 1 %; 40 mg methylPREDNISolone acetate 40 MG/ML Outcome: tolerated well, no immediate complications Procedure, treatment alternatives, risks and benefits explained,  specific risks discussed. Consent was given by the patient. Immediately prior to procedure a time out was called to verify the correct patient, procedure, equipment, support staff and site/side marked as required. Patient was prepped and draped in the usual sterile fashion.      Clinical Data: No additional findings.  ROS:  All other systems negative, except as noted in the HPI. Review of Systems  Objective: Vital Signs: There were no vitals taken for this visit.  Specialty Comments:  No specialty comments available.  PMFS History: Patient Active Problem List   Diagnosis Date Noted   Total knee replacement status, 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   Rotator cuff syndrome, right 01/11/2020   Labile blood glucose    Vascular headache    Small vessel disease, cerebrovascular    Prediabetes    AKI (acute kidney injury) (HCC)    Right pontine stroke  (HCC) 10/06/2019   Acute cerebrovascular accident (CVA) (HCC) 09/30/2019   Unilateral primary osteoarthritis, left knee 03/17/2019   Unilateral primary osteoarthritis, right knee 03/17/2019   Bilateral primary osteoarthritis of knee 03/10/2019   INSOMNIA, CHRONIC 05/19/2007   SPONDYLOSIS, LUMBAR 05/19/2007   Essential hypertension 05/03/2007   ARTHRITIS, KNEE 05/03/2007   Past Medical History:  Diagnosis Date   Allergy    seasonal   Arthritis    CKD (chronic kidney disease)    DDD (degenerative disc disease)    Degenerative disc disease, lumbar    Hypertension    IBS (irritable bowel syndrome)    Osteopenia    Osteoporosis    osteopenia   Stroke (HCC) 09/2019    Family History  Problem Relation Age of Onset   Breast cancer Maternal Aunt    Diabetes Mother    Hypertension Mother    Glaucoma Mother    Other Father        accident   Diabetes Sister    Hypertension Sister    Cancer Sister    Colon cancer Neg Hx    Colon polyps Neg Hx    Esophageal cancer Neg Hx    Stomach cancer Neg Hx    Rectal cancer Neg Hx     Past Surgical History:  Procedure Laterality Date   SHOULDER SURGERY Left    lipoma   TOTAL KNEE ARTHROPLASTY Left 12/26/2022   Procedure: LEFT TOTAL KNEE ARTHROPLASTY;  Surgeon: Nadara Mustard, MD;  Location: [facility] OR;  Service: Orthopedics;  Laterality: Left;   Social History   Occupational History    Comment: retired  Tobacco Use   Smoking status: Former    Current packs/day: 0.00    Types: Cigarettes    Quit date: 10/13/2019    Years since quitting: 3.5   Smokeless tobacco: Never  Vaping Use   Vaping status: Never Used  Substance and Sexual Activity   Alcohol use: Never   Drug use: Never   Sexual activity: Not Currently    Birth control/protection: None

## 2023-05-06 NOTE — Telephone Encounter (Signed)
Per Lajoyce Corners okay to send in as an Rx.  Sent to [facility]

## 2023-05-07 ENCOUNTER — Encounter: Payer: Self-pay | Admitting: Physical Therapy

## 2023-05-07 ENCOUNTER — Other Ambulatory Visit: Payer: Self-pay

## 2023-05-07 ENCOUNTER — Ambulatory Visit: Payer: Medicare HMO | Attending: Physician Assistant | Admitting: Physical Therapy

## 2023-05-07 DIAGNOSIS — M6281 Muscle weakness (generalized): Secondary | ICD-10-CM | POA: Insufficient documentation

## 2023-05-07 DIAGNOSIS — M25562 Pain in left knee: Secondary | ICD-10-CM | POA: Insufficient documentation

## 2023-05-07 DIAGNOSIS — R2689 Other abnormalities of gait and mobility: Secondary | ICD-10-CM | POA: Insufficient documentation

## 2023-05-07 DIAGNOSIS — G8929 Other chronic pain: Secondary | ICD-10-CM | POA: Diagnosis present

## 2023-05-07 NOTE — Therapy (Signed)
OUTPATIENT PHYSICAL THERAPY TREATMENT   Patient Name: Alison Whitehead MRN: [id number] DOB:[birth year], 72 y.o., female Today's Date: 05/07/2023   END OF SESSION:  PT End of Session - 05/07/23 1030     Visit Number 9    Number of Visits 20    Date for PT Re-Evaluation 06/09/23    Authorization Type [employer] MCR    Progress Note Due on Visit 10    PT Start Time 1017    PT Stop Time 1100    PT Time Calculation (min) 43 min    Activity Tolerance Patient tolerated treatment well    Behavior During Therapy WFL for tasks assessed/performed                    Past Medical History:  Diagnosis Date   Allergy    seasonal   Arthritis    CKD (chronic kidney disease)    DDD (degenerative disc disease)    Degenerative disc disease, lumbar    Hypertension    IBS (irritable bowel syndrome)    Osteopenia    Osteoporosis    osteopenia   Stroke (HCC) 09/2019   Past Surgical History:  Procedure Laterality Date   SHOULDER SURGERY Left    lipoma   TOTAL KNEE ARTHROPLASTY Left 12/26/2022   Procedure: LEFT TOTAL KNEE ARTHROPLASTY;  Surgeon: Nadara Mustard, MD;  Location: [facility] OR;  Service: Orthopedics;  Laterality: Left;   Patient Active Problem List   Diagnosis Date Noted   Total knee replacement status, 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   Rotator cuff syndrome, right 01/11/2020   Labile blood glucose    Vascular headache    Small vessel disease, cerebrovascular    Prediabetes    AKI (acute kidney injury) (HCC)    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   Acute cerebrovascular accident (CVA) (HCC) 09/30/2019   Unilateral primary osteoarthritis, left knee 03/17/2019   Unilateral primary osteoarthritis, right knee 03/17/2019   Bilateral primary osteoarthritis of knee 03/10/2019   INSOMNIA, CHRONIC 05/19/2007   SPONDYLOSIS, LUMBAR 05/19/2007   Essential hypertension 05/03/2007   ARTHRITIS, KNEE 05/03/2007    PCP: Burnice Logan, PA  REFERRING PROVIDER: Nadara Mustard, MD   REFERRING  DIAG: [phone] (ICD-10-CM) - Status post total left knee replacement   THERAPY DIAG:  Chronic pain of left knee  Muscle weakness (generalized)  Other abnormalities of gait and mobility  Rationale for Evaluation and Treatment: Rehabilitation  ONSET DATE: 12/26/2022   SUBJECTIVE:  SUBJECTIVE STATEMENT: Patient reports she saw the surgeon after her fall and her x-rays were fine. She did get get an injection in her right knee and reports both her knees are feeling better, but she is still sore from the fall.   PERTINENT HISTORY: CVA, CKD, Osteoporosis, HTN  PAIN:  Are you having pain?  Yes: NPRS scale: 8/10 Worst: 10/10  Pain location: Left knee Pain description: Tight, sharp Aggravating factors: Standing, walking, cooking Relieving factors: Medication, rest  PRECAUTIONS: None  WEIGHT BEARING RESTRICTIONS: No  PATIENT GOALS: improve mobility in left knee in order to get around the home better and decrease pain   OBJECTIVE:  PATIENT SURVEYS:  FOTO: 49% function; 59% predicted   05/07/2023: 63% (met goal)  POSTURE: rounded shoulders and forward head  PALPATION: TTP to L distal quad  LOWER EXTREMITY ROM:  Active ROM Right eval Left eval Left 04/02/2023 Left 04/07/2023 Left 04/09/2023 Left 04/14/2023 Left 04/16/2023 Left 04/23/2023 Left  04/29/23 Left 05/07/2023  Hip flexion  Hip extension            Hip abduction            Hip adduction            Hip internal rotation            Hip external rotation            Knee flexion  96 100 deg 104 deg 107 deg  104 deg 107 PROM 75 99  Knee extension  11 Lacking 10 deg Lacking 8 deg Lacking 6 deg Lacking 5 deg Lacking 4 deg  15 Lacking 10  Ankle dorsiflexion            Ankle plantarflexion            Ankle inversion            Ankle eversion             (Blank rows = not tested)  LOWER EXTREMITY MMT:  MMT Right eval Left eval  Hip flexion    Hip extension    Hip abduction    Hip adduction    Hip  internal rotation    Hip external rotation    Knee flexion    Knee extension    Ankle dorsiflexion    Ankle plantarflexion    Ankle inversion    Ankle eversion     (Blank rows = not tested)  LOWER EXTREMITY SPECIAL TESTS:  DNT  FUNCTIONAL TESTS:  30 Second Sit to Stand: 7 reps - with UE TUG: 29 seconds with FWW  GAIT: Distance walked: 72ft Assistive device utilized: Environmental consultant - 2 wheeled Level of assistance: Modified independence Comments: antalgic gait L, decreased L knee extension   TREATMENT: [facility] Adult PT Treatment:                                                DATE: 05/07/2023 Therapeutic Exercise: NuStep L5 x 6 min with UE/LE while taking subjective Quad set 2 x 10 with 5 sec hold SAQ 2 x 10 on left SLR 2 x 10 on left LAQ with 2 x 10 on left  Sit to stand 2 x 5 from elevated table Manual: Seated and supine knee mobs to improve knee flexion and extension Seated and supine knee PROM for flexion and extension Supine patellar mobs all directions   [facility] Adult PT Treatment:                                                DATE: 04/29/23 Therapeutic Exercise: Passive knee flexion and extension SAQ  Heel slides Seated heel slides Calf stretch with strap seated  LAQ /heel slides  Seated heel toe raises  Modalities: Ice pack left knee   [facility] Adult PT Treatment:                                                DATE: 04/23/2023 Therapeutic Exercise: Supine heel slide with strap 10 x 5 sec hold Quad set 2 x 10 with 5 sec hold SAQ with 3# 2 x  10 on left SLR 2 x 10 on left LAQ with 4# 2 x 10 on left  Sit to stand 2 x 10 from elevated table  Sidelying hip abduction 2 x 10 Manual: Seated and supine knee mobs to improve knee flexion and extension Seated and supine knee PROM for flexion and extension Supine patellar mobs all directions  [facility] Adult PT Treatment:                                                DATE: 04/16/2023 Therapeutic Exercise: NuStep L5 x 6 min with  UE/LE to improving knee motion Supine heel slide with strap 2x10 - 5" hold Quad set x 15 - 5  SLR 2x10 L LAQ 2x10 L 4# Seated hamstring stretch 2x30" L Seated heel slide 2x10 - 5" hold Standing knee flexion stretch on step x 10 - 5" hold Manual: Seated and supine knee mobs to improve knee flexion and extension Seated and supine knee PROM for flexion and extension Supine patellar mobs all directions   PATIENT EDUCATION:  Education details: HEP Person educated: Patient Education method: Programmer, multimedia, Demonstration Education comprehension: verbalized understanding and returned demonstration  HOME EXERCISE PROGRAM: Access Code: [email or web address]   ASSESSMENT: CLINICAL IMPRESSION: Patient tolerated therapy well with no adverse effects. She reports improvement in her knee pain since last visit but does continue to have soreness. Therapy focused on regaining her knee motion and gentle strengthening with good tolerance. She does exhibit improvement in knee motion compared to last visit just following her fall, but remains limited compared to pre-fall motion, and seems to be limited more due to pain rather than stiffness. She does report an improvement in her functional ability on FOTO and achieved her FOTO LTG. No changes made to her HEP this visit. Patient would benefit from continued skilled PT to progress her mobility and strength in order to reduce pain and maximize functional ability.    OBJECTIVE IMPAIRMENTS: decreased activity tolerance, decreased balance, decreased mobility, difficulty walking, decreased ROM, decreased strength, and pain  ACTIVITY LIMITATIONS: sitting, standing, squatting, stairs, transfers, and locomotion level  PARTICIPATION LIMITATIONS: cleaning, driving, shopping, community activity, and yard work  PERSONAL FACTORS: Fitness and 3+ comorbidities: CVA, CKD, Osteoporosis, HTN  are also affecting patient's functional outcome.    GOALS: Goals reviewed with patient?  No  SHORT TERM GOALS: Target date: 04/21/2023   Pt will be compliant and knowledgeable with initial HEP for improved comfort and carryover Baseline: initial HEP given  Goal status: ONGOING  2.  Pt will self report left knee pain no greater than 6/10 for improved comfort and functional ability Baseline: 10/10 at worst Goal status: ONGOING  LONG TERM GOALS: Target date: 06/09/2023   Pt will improve FOTO function score to no less than 59% as proxy for functional improvement Baseline: 49% function 05/07/2023: 63% Goal status: MET  2.  Pt will self report left knee pain no greater than 3/10 for improved comfort and functional ability Baseline: 10/10 at worst Goal status: INITIAL   3.  Pt will increase 30 Second Sit to Stand rep count to no less than 9 reps (MCID 2 reps) for improved balance, strength, and functional mobility Baseline: 7 reps with UE Goal status: INITIAL   4.  Pt will decrease TUG time score to no greater than 18 seconds with LRAD for improved balance and functional  mobility at home and community Baseline: 29 seconds with FWW Goal status: INITIAL  5.  Pt will improve L knee AROM to range of at least 0-115 degrees for improving functional mobility and community navigation post sugery Baseline: see ROM chart Goal status: INITIAL   PLAN: PT FREQUENCY: 2x/week  PT DURATION: 10 weeks  PLANNED INTERVENTIONS: Therapeutic exercises, Therapeutic activity, Neuromuscular re-education, Balance training, Gait training, Patient/Family education, Self Care, Joint mobilization, Dry Needling, Electrical stimulation, Cryotherapy, Moist heat, Vasopneumatic device, Manual therapy, and Re-evaluation  PLAN FOR NEXT SESSION: assess HEP response, L knee ROM, LE strengthening, gait training    Rosana Hoes, PT, DPT, LAT, ATC 05/07/23  10:58 AM Phone: [phone] Fax: [phone]

## 2023-05-11 ENCOUNTER — Ambulatory Visit: Payer: Medicare HMO | Admitting: Orthopedic Surgery

## 2023-05-12 ENCOUNTER — Other Ambulatory Visit: Payer: Self-pay

## 2023-05-12 ENCOUNTER — Ambulatory Visit: Payer: Medicare HMO | Admitting: Physical Therapy

## 2023-05-12 ENCOUNTER — Encounter: Payer: Self-pay | Admitting: Physical Therapy

## 2023-05-12 DIAGNOSIS — M25562 Pain in left knee: Secondary | ICD-10-CM | POA: Diagnosis not present

## 2023-05-12 DIAGNOSIS — G8929 Other chronic pain: Secondary | ICD-10-CM

## 2023-05-12 DIAGNOSIS — R2689 Other abnormalities of gait and mobility: Secondary | ICD-10-CM

## 2023-05-12 DIAGNOSIS — M6281 Muscle weakness (generalized): Secondary | ICD-10-CM

## 2023-05-12 NOTE — Therapy (Signed)
OUTPATIENT PHYSICAL THERAPY TREATMENT   Progress Note Reporting Period 03/31/2023 to 05/12/2023  See note below for Objective Data and Assessment of Progress/Goals.     Patient Name: Alison Whitehead MRN: [id number] DOB:02-Oct-1950, 72 y.o., female Today's Date: 05/12/2023   END OF SESSION:  PT End of Session - 05/12/23 1047     Visit Number 10    Number of Visits 20    Date for PT Re-Evaluation 06/09/23    Authorization Type [employer] MCR    Progress Note Due on Visit 20    PT Start Time 1017    PT Stop Time 1100    PT Time Calculation (min) 43 min    Activity Tolerance Patient tolerated treatment well    Behavior During Therapy WFL for tasks assessed/performed                     Past Medical History:  Diagnosis Date   Allergy    seasonal   Arthritis    CKD (chronic kidney disease)    DDD (degenerative disc disease)    Degenerative disc disease, lumbar    Hypertension    IBS (irritable bowel syndrome)    Osteopenia    Osteoporosis    osteopenia   Stroke (HCC) 09/2019   Past Surgical History:  Procedure Laterality Date   SHOULDER SURGERY Left    lipoma   TOTAL KNEE ARTHROPLASTY Left 12/26/2022   Procedure: LEFT TOTAL KNEE ARTHROPLASTY;  Surgeon: Nadara Mustard, MD;  Location: [facility] OR;  Service: Orthopedics;  Laterality: Left;   Patient Active Problem List   Diagnosis Date Noted   Total knee replacement status, 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   Rotator cuff syndrome, right 01/11/2020   Labile blood glucose    Vascular headache    Small vessel disease, cerebrovascular    Prediabetes    AKI (acute kidney injury) (HCC)    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   Acute cerebrovascular accident (CVA) (HCC) 09/30/2019   Unilateral primary osteoarthritis, left knee 03/17/2019   Unilateral primary osteoarthritis, right knee 03/17/2019   Bilateral primary osteoarthritis of knee 03/10/2019   INSOMNIA, CHRONIC 05/19/2007   SPONDYLOSIS, LUMBAR 05/19/2007   Essential  hypertension 05/03/2007   ARTHRITIS, KNEE 05/03/2007    PCP: Burnice Logan, PA  REFERRING PROVIDER: Nadara Mustard, MD   REFERRING DIAG: [phone] (ICD-10-CM) - Status post total left knee replacement   THERAPY DIAG:  Chronic pain of left knee  Muscle weakness (generalized)  Other abnormalities of gait and mobility  Rationale for Evaluation and Treatment: Rehabilitation  ONSET DATE: 12/26/2022   SUBJECTIVE:  SUBJECTIVE STATEMENT: Patient reports her left knee feels better than the right one. She is now using a rollator.   PERTINENT HISTORY: CVA, CKD, Osteoporosis, HTN  PAIN:  Are you having pain?  Yes: NPRS scale: 6/10 Worst: 10/10  Pain location: Left knee Pain description: Tight, sharp Aggravating factors: Standing, walking, cooking Relieving factors: Medication, rest  PRECAUTIONS: None  WEIGHT BEARING RESTRICTIONS: No  PATIENT GOALS: improve mobility in left knee in order to get around the home better and decrease pain   OBJECTIVE:  PATIENT SURVEYS:  FOTO: 49% function; 59% predicted   05/07/2023: 63% (met goal)  POSTURE: rounded shoulders and forward head  PALPATION: TTP to L distal quad  LOWER EXTREMITY ROM:  Active ROM Right eval Left eval Left 04/02/2023 Left 04/07/2023 Left 04/09/2023 Left 04/14/2023 Left 04/16/2023 Left 04/23/2023 Left  04/29/23 Left 05/07/2023 Left 05/12/2023  Hip flexion  Hip extension             Hip abduction             Hip adduction             Hip internal rotation             Hip external rotation             Knee flexion  96 100 deg 104 deg 107 deg  104 deg 107 PROM 75 99 110  Knee extension  11 Lacking 10 deg Lacking 8 deg Lacking 6 deg Lacking 5 deg Lacking 4 deg  15 Lacking 10 Lacking 10  Ankle dorsiflexion             Ankle plantarflexion             Ankle inversion             Ankle eversion              (Blank rows = not tested)  LOWER EXTREMITY MMT:  MMT Right eval Left eval  Hip flexion     Hip extension    Hip abduction    Hip adduction    Hip internal rotation    Hip external rotation    Knee flexion    Knee extension    Ankle dorsiflexion    Ankle plantarflexion    Ankle inversion    Ankle eversion     (Blank rows = not tested)  LOWER EXTREMITY SPECIAL TESTS:  DNT  FUNCTIONAL TESTS:  30 Second Sit to Stand: 7 reps - with UE  05/12/2023: 8 reps without UE support TUG: 29 seconds with FWW  05/12/2023: 21 seconds with rollator  GAIT: Distance walked: 40ft Assistive device utilized: Environmental consultant - 2 wheeled Level of assistance: Modified independence Comments: antalgic gait L, decreased L knee extension   TREATMENT: [facility] Adult PT Treatment:                                                DATE: 05/12/2023 Therapeutic Exercise: NuStep L5 x 6 min with UE/LE while taking subjective Quad set x 10 with 5 sec hold SLR 2 x 10 each Bridge partial range 2 x 10 Hooklying clamshell with green 2 x 10 LAQ with 3# 2 x 10 each Sit to stand 2 x 10 Manual: Seated and supine knee mobs to improve knee flexion and extension Seated and supine knee PROM for flexion and extension Supine patellar mobs all directions   [facility] Adult PT Treatment:                                                DATE: 05/07/2023 Therapeutic Exercise: NuStep L5 x 6 min with UE/LE while taking subjective Quad set 2 x 10 with 5 sec hold SAQ 2 x 10 on left SLR 2 x 10 on left LAQ with 2 x 10 on left  Sit to stand 2 x 5 from elevated table Manual: Seated and supine knee mobs to improve knee flexion and extension Seated and supine knee PROM for flexion and extension Supine patellar mobs all directions  [facility] Adult PT Treatment:  DATE: 04/29/23 Therapeutic Exercise: Passive knee flexion and extension SAQ  Heel slides Seated heel slides Calf stretch with strap seated  LAQ /heel slides  Seated heel toe raises  Modalities: Ice pack left knee   [facility] Adult PT  Treatment:                                                DATE: 04/23/2023 Therapeutic Exercise: Supine heel slide with strap 10 x 5 sec hold Quad set 2 x 10 with 5 sec hold SAQ with 3# 2 x 10 on left SLR 2 x 10 on left LAQ with 4# 2 x 10 on left  Sit to stand 2 x 10 from elevated table  Sidelying hip abduction 2 x 10 Manual: Seated and supine knee mobs to improve knee flexion and extension Seated and supine knee PROM for flexion and extension Supine patellar mobs all directions  [facility] Adult PT Treatment:                                                DATE: 04/16/2023 Therapeutic Exercise: NuStep L5 x 6 min with UE/LE to improving knee motion Supine heel slide with strap 2x10 - 5" hold Quad set x 15 - 5  SLR 2x10 L LAQ 2x10 L 4# Seated hamstring stretch 2x30" L Seated heel slide 2x10 - 5" hold Standing knee flexion stretch on step x 10 - 5" hold Manual: Seated and supine knee mobs to improve knee flexion and extension Seated and supine knee PROM for flexion and extension Supine patellar mobs all directions  PATIENT EDUCATION:  Education details: HEP Person educated: Patient Education method: Programmer, multimedia, Demonstration Education comprehension: verbalized understanding and returned demonstration  HOME EXERCISE PROGRAM: Access Code: [email or web address]   ASSESSMENT: CLINICAL IMPRESSION: Patient tolerated therapy well with no adverse effects. She demonstrates continued improvement in her left knee flexion but remains limited with knee extension. She tolerated progression in her knee and hip strengthening this visit and incorporated more strengthening on the right side as well. She does exhibit improvement in her 5xSTS and TUG this visit and seems to be progressing overall with her mobility. No changes made to her HEP this visit. Patient would benefit from continued skilled PT to progress her mobility and strength in order to reduce pain and maximize functional ability.    OBJECTIVE  IMPAIRMENTS: decreased activity tolerance, decreased balance, decreased mobility, difficulty walking, decreased ROM, decreased strength, and pain  ACTIVITY LIMITATIONS: sitting, standing, squatting, stairs, transfers, and locomotion level  PARTICIPATION LIMITATIONS: cleaning, driving, shopping, community activity, and yard work  PERSONAL FACTORS: Fitness and 3+ comorbidities: CVA, CKD, Osteoporosis, HTN  are also affecting patient's functional outcome.    GOALS: Goals reviewed with patient? No  SHORT TERM GOALS: Target date: 04/21/2023   Pt will be compliant and knowledgeable with initial HEP for improved comfort and carryover Baseline: initial HEP given  05/12/2023: independent Goal status: MET  2.  Pt will self report left knee pain no greater than 6/10 for improved comfort and functional ability Baseline: 10/10 at worst 05/12/2023: 10/10 at worst Goal status: ONGOING  LONG TERM GOALS: Target date: 06/09/2023   Pt will improve FOTO function score to no less than 59% as proxy  for functional improvement Baseline: 49% function 05/07/2023: 63% Goal status: MET  2.  Pt will self report left knee pain no greater than 3/10 for improved comfort and functional ability Baseline: 10/10 at worst 05/12/2023: 10/10 at worst Goal status: ONGOING  3.  Pt will increase 30 Second Sit to Stand rep count to no less than 9 reps (MCID 2 reps) for improved balance, strength, and functional mobility Baseline: 7 reps with UE 05/12/2023: 8 reps without UE support Goal status: ONGOING  4.  Pt will decrease TUG time score to no greater than 18 seconds with LRAD for improved balance and functional mobility at home and community Baseline: 29 seconds with FWW 05/12/2023: 21 seconds with rollator Goal status: ONGOING  5.  Pt will improve L knee AROM to range of at least 0-115 degrees for improving functional mobility and community navigation post sugery Baseline: see ROM chart 05/12/2023: 10-110 deg Goal  status: ONGOING   PLAN: PT FREQUENCY: 2x/week  PT DURATION: 10 weeks  PLANNED INTERVENTIONS: Therapeutic exercises, Therapeutic activity, Neuromuscular re-education, Balance training, Gait training, Patient/Family education, Self Care, Joint mobilization, Dry Needling, Electrical stimulation, Cryotherapy, Moist heat, Vasopneumatic device, Manual therapy, and Re-evaluation  PLAN FOR NEXT SESSION: assess HEP response, L knee ROM, LE strengthening, gait training    Rosana Hoes, PT, DPT, LAT, ATC 05/12/23  11:00 AM Phone: [phone] Fax: [phone]

## 2023-05-21 ENCOUNTER — Ambulatory Visit: Payer: Medicare HMO | Admitting: Physical Therapy

## 2023-05-21 ENCOUNTER — Encounter: Payer: Self-pay | Admitting: Physical Therapy

## 2023-05-21 ENCOUNTER — Other Ambulatory Visit: Payer: Self-pay

## 2023-05-21 DIAGNOSIS — G8929 Other chronic pain: Secondary | ICD-10-CM

## 2023-05-21 DIAGNOSIS — M25562 Pain in left knee: Secondary | ICD-10-CM | POA: Diagnosis not present

## 2023-05-21 DIAGNOSIS — M6281 Muscle weakness (generalized): Secondary | ICD-10-CM

## 2023-05-21 DIAGNOSIS — R2689 Other abnormalities of gait and mobility: Secondary | ICD-10-CM

## 2023-05-21 NOTE — Therapy (Signed)
OUTPATIENT PHYSICAL THERAPY TREATMENT    Patient Name: Alison Whitehead MRN: [id number] DOB:02-10-1951, 72 y.o., female Today's Date: 05/21/2023   END OF SESSION:  PT End of Session - 05/21/23 1131     Visit Number 11    Number of Visits 20    Date for PT Re-Evaluation 06/09/23    Authorization Type [employer] MCR    Progress Note Due on Visit 20    PT Start Time 1100    PT Stop Time 1145    PT Time Calculation (min) 45 min    Activity Tolerance Patient limited by pain    Behavior During Therapy [facility] for tasks assessed/performed                      Past Medical History:  Diagnosis Date   Allergy    seasonal   Arthritis    CKD (chronic kidney disease)    DDD (degenerative disc disease)    Degenerative disc disease, lumbar    Hypertension    IBS (irritable bowel syndrome)    Osteopenia    Osteoporosis    osteopenia   Stroke (HCC) 09/2019   Past Surgical History:  Procedure Laterality Date   SHOULDER SURGERY Left    lipoma   TOTAL KNEE ARTHROPLASTY Left 12/26/2022   Procedure: LEFT TOTAL KNEE ARTHROPLASTY;  Surgeon: Nadara Mustard, MD;  Location: [facility] OR;  Service: Orthopedics;  Laterality: Left;   Patient Active Problem List   Diagnosis Date Noted   Total knee replacement status, 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   Rotator cuff syndrome, right 01/11/2020   Labile blood glucose    Vascular headache    Small vessel disease, cerebrovascular    Prediabetes    AKI (acute kidney injury) (HCC)    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   Acute cerebrovascular accident (CVA) (HCC) 09/30/2019   Unilateral primary osteoarthritis, left knee 03/17/2019   Unilateral primary osteoarthritis, right knee 03/17/2019   Bilateral primary osteoarthritis of knee 03/10/2019   INSOMNIA, CHRONIC 05/19/2007   SPONDYLOSIS, LUMBAR 05/19/2007   Essential hypertension 05/03/2007   ARTHRITIS, KNEE 05/03/2007    PCP: Burnice Logan, PA  REFERRING PROVIDER: Nadara Mustard, MD   REFERRING  DIAG: [phone] (ICD-10-CM) - Status post total left knee replacement   THERAPY DIAG:  Chronic pain of left knee  Muscle weakness (generalized)  Other abnormalities of gait and mobility  Rationale for Evaluation and Treatment: Rehabilitation  ONSET DATE: 12/26/2022   SUBJECTIVE:  SUBJECTIVE STATEMENT: Patient arrives reporting everything hurts this morning. She thinks the weather has flared up her arthritis. Her knee is feeling stiff this morning.  PERTINENT HISTORY: CVA, CKD, Osteoporosis, HTN  PAIN:  Are you having pain?  Yes: NPRS scale: 7/10 Worst: 10/10  Pain location: Left knee Pain description: Tight, sharp Aggravating factors: Standing, walking, cooking Relieving factors: Medication, rest  PRECAUTIONS: None  WEIGHT BEARING RESTRICTIONS: No  PATIENT GOALS: improve mobility in left knee in order to get around the home better and decrease pain   OBJECTIVE:  PATIENT SURVEYS:  FOTO: 49% function; 59% predicted   05/07/2023: 63% (met goal)  POSTURE: rounded shoulders and forward head  PALPATION: TTP to L distal quad  LOWER EXTREMITY ROM:  Active ROM Right eval Left eval Left 04/02/2023 Left 04/07/2023 Left 04/09/2023 Left 04/14/2023 Left 04/16/2023 Left 04/23/2023 Left  04/29/23 Left 05/07/2023 Left 05/12/2023 Left 05/21/2023  Hip flexion              Hip extension  Hip abduction              Hip adduction              Hip internal rotation              Hip external rotation              Knee flexion  96 100 deg 104 deg 107 deg  104 deg 107 PROM 75 99 110 110  Knee extension  11 Lacking 10 deg Lacking 8 deg Lacking 6 deg Lacking 5 deg Lacking 4 deg  15 Lacking 10 Lacking 10 Lacking 8  Ankle dorsiflexion              Ankle plantarflexion              Ankle inversion              Ankle eversion               (Blank rows = not tested)  LOWER EXTREMITY MMT:  MMT Right eval Left eval  Hip flexion    Hip extension    Hip abduction    Hip  adduction    Hip internal rotation    Hip external rotation    Knee flexion    Knee extension    Ankle dorsiflexion    Ankle plantarflexion    Ankle inversion    Ankle eversion     (Blank rows = not tested)  LOWER EXTREMITY SPECIAL TESTS:  DNT  FUNCTIONAL TESTS:  30 Second Sit to Stand: 7 reps - with UE  05/12/2023: 8 reps without UE support TUG: 29 seconds with FWW  05/12/2023: 21 seconds with rollator  GAIT: Distance walked: 35ft Assistive device utilized: Environmental consultant - 2 wheeled Level of assistance: Modified independence Comments: antalgic gait L, decreased L knee extension   TREATMENT: [facility] Adult PT Treatment:                                                DATE: 05/21/2023 Therapeutic Exercise: NuStep L5 x 7 min with UE/LE while taking subjective Supine heel slide knee AROM x 10 Quad set x 10 with 5 sec hold SLR 2 x 10 each Hooklying clamshell with green 2 x 10 LAQ with 4# 2 x 10 each Sit to stand without UE support 2 x 10 Manual: Seated and supine knee mobs to improve knee flexion and extension Seated knee flexion PROM Seated and supine knee PROM for flexion and extension Supine patellar mobs all directions Modified thomas stretch 3 x 30 sec on left   [facility] Adult PT Treatment:                                                DATE: 05/12/2023 Therapeutic Exercise: NuStep L5 x 6 min with UE/LE while taking subjective Quad set x 10 with 5 sec hold SLR 2 x 10 each Bridge partial range 2 x 10 Hooklying clamshell with green 2 x 10 LAQ with 3# 2 x 10 each Sit to stand 2 x 10 Manual: Seated and supine knee mobs to improve knee flexion and extension Seated and supine knee PROM for flexion and extension Supine patellar mobs  all directions  [facility] Adult PT Treatment:                                                DATE: 05/07/2023 Therapeutic Exercise: NuStep L5 x 6 min with UE/LE while taking subjective Quad set 2 x 10 with 5 sec hold SAQ 2 x 10 on left SLR 2 x 10 on  left LAQ with 2 x 10 on left  Sit to stand 2 x 5 from elevated table Manual: Seated and supine knee mobs to improve knee flexion and extension Seated and supine knee PROM for flexion and extension Supine patellar mobs all directions  [facility] Adult PT Treatment:                                                DATE: 04/29/23 Therapeutic Exercise: Passive knee flexion and extension SAQ  Heel slides Seated heel slides Calf stretch with strap seated  LAQ /heel slides  Seated heel toe raises  Modalities: Ice pack left knee   PATIENT EDUCATION:  Education details: HEP Person educated: Patient Education method: Programmer, multimedia, Demonstration Education comprehension: verbalized understanding and returned demonstration  HOME EXERCISE PROGRAM: Access Code: [email or web address]   ASSESSMENT: CLINICAL IMPRESSION: Patient with fair tolerance for therapy today due to pain, no adverse effects reported. She did demonstrate an improvement in her knee extension this visit compared to previous assessment but does continue be exhibit limitation in her knee motion with pain. She was able to complete strengthening exercises but reported pain and limitation with all her exercises. No changes made to her HEP this visit. Patient would benefit from continued skilled PT to progress her mobility and strength in order to reduce pain and maximize functional ability.    OBJECTIVE IMPAIRMENTS: decreased activity tolerance, decreased balance, decreased mobility, difficulty walking, decreased ROM, decreased strength, and pain  ACTIVITY LIMITATIONS: sitting, standing, squatting, stairs, transfers, and locomotion level  PARTICIPATION LIMITATIONS: cleaning, driving, shopping, community activity, and yard work  PERSONAL FACTORS: Fitness and 3+ comorbidities: CVA, CKD, Osteoporosis, HTN  are also affecting patient's functional outcome.    GOALS: Goals reviewed with patient? No  SHORT TERM GOALS: Target date: 04/21/2023   Pt will  be compliant and knowledgeable with initial HEP for improved comfort and carryover Baseline: initial HEP given  05/12/2023: independent Goal status: MET  2.  Pt will self report left knee pain no greater than 6/10 for improved comfort and functional ability Baseline: 10/10 at worst 05/12/2023: 10/10 at worst Goal status: ONGOING  LONG TERM GOALS: Target date: 06/09/2023   Pt will improve FOTO function score to no less than 59% as proxy for functional improvement Baseline: 49% function 05/07/2023: 63% Goal status: MET  2.  Pt will self report left knee pain no greater than 3/10 for improved comfort and functional ability Baseline: 10/10 at worst 05/12/2023: 10/10 at worst Goal status: ONGOING  3.  Pt will increase 30 Second Sit to Stand rep count to no less than 9 reps (MCID 2 reps) for improved balance, strength, and functional mobility Baseline: 7 reps with UE 05/12/2023: 8 reps without UE support Goal status: ONGOING  4.  Pt will decrease TUG time score to no greater than 18 seconds with LRAD  for improved balance and functional mobility at home and community Baseline: 29 seconds with FWW 05/12/2023: 21 seconds with rollator Goal status: ONGOING  5.  Pt will improve L knee AROM to range of at least 0-115 degrees for improving functional mobility and community navigation post sugery Baseline: see ROM chart 05/12/2023: 10-110 deg Goal status: ONGOING   PLAN: PT FREQUENCY: 2x/week  PT DURATION: 10 weeks  PLANNED INTERVENTIONS: Therapeutic exercises, Therapeutic activity, Neuromuscular re-education, Balance training, Gait training, Patient/Family education, Self Care, Joint mobilization, Dry Needling, Electrical stimulation, Cryotherapy, Moist heat, Vasopneumatic device, Manual therapy, and Re-evaluation  PLAN FOR NEXT SESSION: assess HEP response, L knee ROM, LE strengthening, gait training    Rosana Hoes, PT, DPT, LAT, ATC 05/21/23  11:48 AM Phone: [phone] Fax:  [phone]

## 2023-05-26 ENCOUNTER — Ambulatory Visit: Payer: Medicare HMO | Admitting: Physical Therapy

## 2023-05-26 ENCOUNTER — Encounter: Payer: Self-pay | Admitting: Physical Therapy

## 2023-05-26 ENCOUNTER — Other Ambulatory Visit: Payer: Self-pay

## 2023-05-26 DIAGNOSIS — M25562 Pain in left knee: Secondary | ICD-10-CM | POA: Diagnosis not present

## 2023-05-26 DIAGNOSIS — M6281 Muscle weakness (generalized): Secondary | ICD-10-CM

## 2023-05-26 DIAGNOSIS — R2689 Other abnormalities of gait and mobility: Secondary | ICD-10-CM

## 2023-05-26 DIAGNOSIS — G8929 Other chronic pain: Secondary | ICD-10-CM

## 2023-05-26 NOTE — Therapy (Signed)
OUTPATIENT PHYSICAL THERAPY TREATMENT    Patient Name: Alison Whitehead MRN: [id number] DOB:Aug 20, 1950, 72 y.o., female Today's Date: 05/26/2023   END OF SESSION:  PT End of Session - 05/26/23 1113     Visit Number 12    Number of Visits 20    Date for PT Re-Evaluation 06/09/23    Authorization Type [employer] MCR    Progress Note Due on Visit 20    PT Start Time 1101    PT Stop Time 1130   patient requested to leave early   PT Time Calculation (min) 29 min    Activity Tolerance Patient tolerated treatment well    Behavior During Therapy [facility] for tasks assessed/performed                       Past Medical History:  Diagnosis Date   Allergy    seasonal   Arthritis    CKD (chronic kidney disease)    DDD (degenerative disc disease)    Degenerative disc disease, lumbar    Hypertension    IBS (irritable bowel syndrome)    Osteopenia    Osteoporosis    osteopenia   Stroke (HCC) 09/2019   Past Surgical History:  Procedure Laterality Date   SHOULDER SURGERY Left    lipoma   TOTAL KNEE ARTHROPLASTY Left 12/26/2022   Procedure: LEFT TOTAL KNEE ARTHROPLASTY;  Surgeon: Nadara Mustard, MD;  Location: [facility] OR;  Service: Orthopedics;  Laterality: Left;   Patient Active Problem List   Diagnosis Date Noted   Total knee replacement status, 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   Rotator cuff syndrome, right 01/11/2020   Labile blood glucose    Vascular headache    Small vessel disease, cerebrovascular    Prediabetes    AKI (acute kidney injury) (HCC)    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   Acute cerebrovascular accident (CVA) (HCC) 09/30/2019   Unilateral primary osteoarthritis, left knee 03/17/2019   Unilateral primary osteoarthritis, right knee 03/17/2019   Bilateral primary osteoarthritis of knee 03/10/2019   INSOMNIA, CHRONIC 05/19/2007   SPONDYLOSIS, LUMBAR 05/19/2007   Essential hypertension 05/03/2007   ARTHRITIS, KNEE 05/03/2007    PCP: Burnice Logan, PA  REFERRING  PROVIDER: Nadara Mustard, MD   REFERRING DIAG: [phone] (ICD-10-CM) - Status post total left knee replacement   THERAPY DIAG:  Chronic pain of left knee  Muscle weakness (generalized)  Other abnormalities of gait and mobility  Rationale for Evaluation and Treatment: Rehabilitation  ONSET DATE: 12/26/2022   SUBJECTIVE:  SUBJECTIVE STATEMENT: Patient reports she is sore and stiff today because she did more walking and standing over the weekend.   PERTINENT HISTORY: CVA, CKD, Osteoporosis, HTN  PAIN:  Are you having pain?  Yes: NPRS scale: 6/10 Worst: 10/10  Pain location: Left knee Pain description: Tight, sharp Aggravating factors: Standing, walking, cooking Relieving factors: Medication, rest  PRECAUTIONS: None  WEIGHT BEARING RESTRICTIONS: No  PATIENT GOALS: improve mobility in left knee in order to get around the home better and decrease pain   OBJECTIVE:  PATIENT SURVEYS:  FOTO: 49% function; 59% predicted   05/07/2023: 63% (met goal)  POSTURE: rounded shoulders and forward head  PALPATION: TTP to L distal quad  LOWER EXTREMITY ROM:  Active ROM Right eval Left eval Left 04/02/2023 Left 04/07/2023 Left 04/09/2023 Left 04/14/2023 Left 04/16/2023 Left 04/23/2023 Left  04/29/23 Left 05/07/2023 Left 05/12/2023 Left 05/21/2023 Left 05/26/2023  Hip flexion  Hip extension               Hip abduction               Hip adduction               Hip internal rotation               Hip external rotation               Knee flexion  96 100 deg 104 deg 107 deg  104 deg 107 PROM 75 99 110 110 110  Knee extension  11 Lacking 10 deg Lacking 8 deg Lacking 6 deg Lacking 5 deg Lacking 4 deg  15 Lacking 10 Lacking 10 Lacking 8   Ankle dorsiflexion               Ankle plantarflexion               Ankle inversion               Ankle eversion                (Blank rows = not tested)  LOWER EXTREMITY MMT:  MMT Right eval Left eval Left 05/26/2023  Hip  flexion     Hip extension     Hip abduction     Hip adduction     Hip internal rotation     Hip external rotation     Knee flexion   4  Knee extension   4  Ankle dorsiflexion     Ankle plantarflexion     Ankle inversion     Ankle eversion      (Blank rows = not tested)  LOWER EXTREMITY SPECIAL TESTS:  DNT  FUNCTIONAL TESTS:  30 Second Sit to Stand: 7 reps - with UE  05/12/2023: 8 reps without UE support TUG: 29 seconds with FWW  05/12/2023: 21 seconds with rollator  GAIT: Distance walked: 65ft Assistive device utilized: Environmental consultant - 2 wheeled Level of assistance: Modified independence Comments: antalgic gait L, decreased L knee extension   TREATMENT: [facility] Adult PT Treatment:                                                DATE: 05/26/2023 Therapeutic Exercise: NuStep L5 x 5 min with UE/LE while taking subjective Quad set x 10 with 5 sec hold on left SLR 2 x 10 on left LAQ 2 x 20 on left Manual: Seated and supine knee mobs to improve knee flexion and extension Seated knee flexion PROM Seated and supine knee PROM for flexion and extension Supine patellar mobs all directions Modified thomas stretch 3 x 30 sec on left Supine hamstring stretch 3 x 30 sec on left   [facility] Adult PT Treatment:                                                DATE: 05/21/2023 Therapeutic Exercise: NuStep L5 x 7 min with UE/LE while taking subjective Supine heel slide knee AROM x 10 Quad set x 10 with 5 sec hold SLR 2 x 10 each Hooklying clamshell with green 2 x 10 LAQ with 4# 2 x 10 each Sit  to stand without UE support 2 x 10 Manual: Seated and supine knee mobs to improve knee flexion and extension Seated knee flexion PROM Seated and supine knee PROM for flexion and extension Supine patellar mobs all directions Modified thomas stretch 3 x 30 sec on left  [facility] Adult PT Treatment:                                                DATE: 05/12/2023 Therapeutic Exercise: NuStep L5 x 6 min with  UE/LE while taking subjective Quad set x 10 with 5 sec hold SLR 2 x 10 each Bridge partial range 2 x 10 Hooklying clamshell with green 2 x 10 LAQ with 3# 2 x 10 each Sit to stand 2 x 10 Manual: Seated and supine knee mobs to improve knee flexion and extension Seated and supine knee PROM for flexion and extension Supine patellar mobs all directions  [facility] Adult PT Treatment:                                                DATE: 05/07/2023 Therapeutic Exercise: NuStep L5 x 6 min with UE/LE while taking subjective Quad set 2 x 10 with 5 sec hold SAQ 2 x 10 on left SLR 2 x 10 on left LAQ with 2 x 10 on left  Sit to stand 2 x 5 from elevated table Manual: Seated and supine knee mobs to improve knee flexion and extension Seated and supine knee PROM for flexion and extension Supine patellar mobs all directions  PATIENT EDUCATION:  Education details: HEP Person educated: Patient Education method: Programmer, multimedia, Demonstration Education comprehension: verbalized understanding and returned demonstration  HOME EXERCISE PROGRAM: Access Code: [email or web address]   ASSESSMENT: CLINICAL IMPRESSION: Patient with better tolerance for therapy this visit but does continue to report bilateral knee pain and stiffness that impacts therapy, no adverse effects reported this visit. Therapy shortened this visit due to patient needing to leave for another appointment. Therapy focused primarily on progressing knee mobility and quad strengthening. She does continue to exhibit limitations with her knee motion and strength. She remains limited with the right knee that is likely impacting her progress with the left. No changes made to her HEP this visit. Patient would benefit from continued skilled PT to progress her mobility and strength in order to reduce pain and maximize functional ability.    OBJECTIVE IMPAIRMENTS: decreased activity tolerance, decreased balance, decreased mobility, difficulty walking, decreased ROM,  decreased strength, and pain  ACTIVITY LIMITATIONS: sitting, standing, squatting, stairs, transfers, and locomotion level  PARTICIPATION LIMITATIONS: cleaning, driving, shopping, community activity, and yard work  PERSONAL FACTORS: Fitness and 3+ comorbidities: CVA, CKD, Osteoporosis, HTN  are also affecting patient's functional outcome.    GOALS: Goals reviewed with patient? No  SHORT TERM GOALS: Target date: 04/21/2023   Pt will be compliant and knowledgeable with initial HEP for improved comfort and carryover Baseline: initial HEP given  05/12/2023: independent Goal status: MET  2.  Pt will self report left knee pain no greater than 6/10 for improved comfort and functional ability Baseline: 10/10 at worst 05/12/2023: 10/10 at worst Goal status: ONGOING  LONG TERM GOALS: Target date: 06/09/2023   Pt will improve FOTO function score to  no less than 59% as proxy for functional improvement Baseline: 49% function 05/07/2023: 63% Goal status: MET  2.  Pt will self report left knee pain no greater than 3/10 for improved comfort and functional ability Baseline: 10/10 at worst 05/12/2023: 10/10 at worst Goal status: ONGOING  3.  Pt will increase 30 Second Sit to Stand rep count to no less than 9 reps (MCID 2 reps) for improved balance, strength, and functional mobility Baseline: 7 reps with UE 05/12/2023: 8 reps without UE support Goal status: ONGOING  4.  Pt will decrease TUG time score to no greater than 18 seconds with LRAD for improved balance and functional mobility at home and community Baseline: 29 seconds with FWW 05/12/2023: 21 seconds with rollator Goal status: ONGOING  5.  Pt will improve L knee AROM to range of at least 0-115 degrees for improving functional mobility and community navigation post sugery Baseline: see ROM chart 05/12/2023: 10-110 deg Goal status: ONGOING   PLAN: PT FREQUENCY: 2x/week  PT DURATION: 10 weeks  PLANNED INTERVENTIONS: Therapeutic  exercises, Therapeutic activity, Neuromuscular re-education, Balance training, Gait training, Patient/Family education, Self Care, Joint mobilization, Dry Needling, Electrical stimulation, Cryotherapy, Moist heat, Vasopneumatic device, Manual therapy, and Re-evaluation  PLAN FOR NEXT SESSION: assess HEP response, L knee ROM, LE strengthening, gait training    Rosana Hoes, PT, DPT, LAT, ATC 05/26/23  11:43 AM Phone: [phone] Fax: [phone]

## 2023-06-09 ENCOUNTER — Encounter: Payer: Self-pay | Admitting: Physical Therapy

## 2023-06-09 ENCOUNTER — Ambulatory Visit: Payer: Medicare HMO | Attending: Physician Assistant | Admitting: Physical Therapy

## 2023-06-09 DIAGNOSIS — M25562 Pain in left knee: Secondary | ICD-10-CM | POA: Diagnosis present

## 2023-06-09 DIAGNOSIS — G8929 Other chronic pain: Secondary | ICD-10-CM | POA: Insufficient documentation

## 2023-06-09 DIAGNOSIS — M6281 Muscle weakness (generalized): Secondary | ICD-10-CM | POA: Diagnosis present

## 2023-06-09 DIAGNOSIS — R2689 Other abnormalities of gait and mobility: Secondary | ICD-10-CM | POA: Diagnosis present

## 2023-06-09 NOTE — Therapy (Signed)
OUTPATIENT PHYSICAL THERAPY TREATMENT    Patient Name: Alison Whitehead MRN: [id number] DOB:06-20-1951, 72 y.o., female Today's Date: 06/09/2023   END OF SESSION:  PT End of Session - 06/09/23 1317     Visit Number 13    Number of Visits 20    Date for PT Re-Evaluation 06/09/23    Authorization Type [employer] MCR    Progress Note Due on Visit 20    PT Start Time 1315    PT Stop Time 1355    PT Time Calculation (min) 40 min                       Past Medical History:  Diagnosis Date   Allergy    seasonal   Arthritis    CKD (chronic kidney disease)    DDD (degenerative disc disease)    Degenerative disc disease, lumbar    Hypertension    IBS (irritable bowel syndrome)    Osteopenia    Osteoporosis    osteopenia   Stroke (HCC) 09/2019   Past Surgical History:  Procedure Laterality Date   SHOULDER SURGERY Left    lipoma   TOTAL KNEE ARTHROPLASTY Left 12/26/2022   Procedure: LEFT TOTAL KNEE ARTHROPLASTY;  Surgeon: Nadara Mustard, MD;  Location: [facility] OR;  Service: Orthopedics;  Laterality: Left;   Patient Active Problem List   Diagnosis Date Noted   Total knee replacement status, 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   Rotator cuff syndrome, right 01/11/2020   Labile blood glucose    Vascular headache    Small vessel disease, cerebrovascular    Prediabetes    AKI (acute kidney injury) (HCC)    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   Acute cerebrovascular accident (CVA) (HCC) 09/30/2019   Unilateral primary osteoarthritis, left knee 03/17/2019   Unilateral primary osteoarthritis, right knee 03/17/2019   Bilateral primary osteoarthritis of knee 03/10/2019   INSOMNIA, CHRONIC 05/19/2007   SPONDYLOSIS, LUMBAR 05/19/2007   Essential hypertension 05/03/2007   ARTHRITIS, KNEE 05/03/2007    PCP: Burnice Logan, PA  REFERRING PROVIDER: Nadara Mustard, MD   REFERRING DIAG: [phone] (ICD-10-CM) - Status post total left knee replacement   THERAPY DIAG:  Chronic pain of left  knee  Muscle weakness (generalized)  Other abnormalities of gait and mobility  Rationale for Evaluation and Treatment: Rehabilitation  ONSET DATE: 12/26/2022   SUBJECTIVE:  SUBJECTIVE STATEMENT: Increased pain with prolonged sitting and trying to get comfortable at night. Also, walking.    PERTINENT HISTORY: CVA, CKD, Osteoporosis, HTN  PAIN:  Are you having pain?  Yes: NPRS scale: 8/10 Worst: 10/10  Pain location: Left knee and right Pain description: Tight, sharp Aggravating factors: Standing, walking, cooking Relieving factors: Medication, rest  PRECAUTIONS: None  WEIGHT BEARING RESTRICTIONS: No  PATIENT GOALS: improve mobility in left knee in order to get around the home better and decrease pain   OBJECTIVE:  PATIENT SURVEYS:  FOTO: 49% function; 59% predicted   05/07/2023: 63% (met goal)  POSTURE: rounded shoulders and forward head  PALPATION: TTP to L distal quad  LOWER EXTREMITY ROM:  Active ROM Right eval Left eval Left 04/02/2023 Left 04/07/2023 Left 04/09/2023 Left 04/14/2023 Left 04/16/2023 Left 04/23/2023 Left  04/29/23 Left 05/07/2023 Left 05/12/2023 Left 05/21/2023 Left 05/26/2023 Left 06/09/23  Hip flexion                Hip extension                Hip abduction  Hip adduction                Hip internal rotation                Hip external rotation                Knee flexion  96 100 deg 104 deg 107 deg  104 deg 107 PROM 75 99 110 110 110 102  Knee extension  11 Lacking 10 deg Lacking 8 deg Lacking 6 deg Lacking 5 deg Lacking 4 deg  15 Lacking 10 Lacking 10 Lacking 8  Lacking 8  Ankle dorsiflexion                Ankle plantarflexion                Ankle inversion                Ankle eversion                 (Blank rows = not tested)  LOWER EXTREMITY MMT:  MMT Right eval Left eval Left 05/26/2023  Hip flexion     Hip extension     Hip abduction     Hip adduction     Hip internal rotation     Hip external  rotation     Knee flexion   4  Knee extension   4  Ankle dorsiflexion     Ankle plantarflexion     Ankle inversion     Ankle eversion      (Blank rows = not tested)  LOWER EXTREMITY SPECIAL TESTS:  DNT  FUNCTIONAL TESTS:  30 Second Sit to Stand: 7 reps - with UE  05/12/2023: 8 reps without UE support  06/09/23: 8 reps without UE support  TUG: 29 seconds with FWW  05/12/2023: 21 seconds with rollator  GAIT: Distance walked: 54ft Assistive device utilized: Environmental consultant - 2 wheeled Level of assistance: Modified independence Comments: antalgic gait L, decreased L knee extension   TREATMENT: [facility] Adult PT Treatment:                                                DATE: 06/09/23 Therapeutic Exercise: Nustep x 6 min QS SLR 10 x 2  STS x 8 SAQ 5# 10 x 3  Supine heel slide AAROM 10 x 2  Supine h/s stretch strap  Hip flexor stretch  Seated h/s curls red band  Seated hamstring stretch EOM  Manual Therapy: A/P mobs for flexion and extension.  Patella mobs all planes/ Passive knee flexion and extension supine     [facility] Adult PT Treatment:                                                DATE: 05/26/2023 Therapeutic Exercise: NuStep L5 x 5 min with UE/LE while taking subjective Quad set x 10 with 5 sec hold on left SLR 2 x 10 on left LAQ 2 x 20 on left Manual: Seated and supine knee mobs to improve knee flexion and extension Seated knee flexion PROM Seated and supine knee PROM for flexion and extension Supine patellar mobs all directions Modified thomas stretch 3 x 30 sec on left  Supine hamstring stretch 3 x 30 sec on left   [facility] Adult PT Treatment:                                                DATE: 05/21/2023 Therapeutic Exercise: NuStep L5 x 7 min with UE/LE while taking subjective Supine heel slide knee AROM x 10 Quad set x 10 with 5 sec hold SLR 2 x 10 each Hooklying clamshell with green 2 x 10 LAQ with 4# 2 x 10 each Sit to stand without UE support 2 x  10 Manual: Seated and supine knee mobs to improve knee flexion and extension Seated knee flexion PROM Seated and supine knee PROM for flexion and extension Supine patellar mobs all directions Modified thomas stretch 3 x 30 sec on left  [facility] Adult PT Treatment:                                                DATE: 05/12/2023 Therapeutic Exercise: NuStep L5 x 6 min with UE/LE while taking subjective Quad set x 10 with 5 sec hold SLR 2 x 10 each Bridge partial range 2 x 10 Hooklying clamshell with green 2 x 10 LAQ with 3# 2 x 10 each Sit to stand 2 x 10 Manual: Seated and supine knee mobs to improve knee flexion and extension Seated and supine knee PROM for flexion and extension Supine patellar mobs all directions  [facility] Adult PT Treatment:                                                DATE: 05/07/2023 Therapeutic Exercise: NuStep L5 x 6 min with UE/LE while taking subjective Quad set 2 x 10 with 5 sec hold SAQ 2 x 10 on left SLR 2 x 10 on left LAQ with 2 x 10 on left  Sit to stand 2 x 5 from elevated table Manual: Seated and supine knee mobs to improve knee flexion and extension Seated and supine knee PROM for flexion and extension Supine patellar mobs all directions  PATIENT EDUCATION:  Education details: HEP Person educated: Patient Education method: Programmer, multimedia, Demonstration Education comprehension: verbalized understanding and returned demonstration  HOME EXERCISE PROGRAM: Access Code: [email or web address]   ASSESSMENT: CLINICAL IMPRESSION: Patient tolerated therapy without adverse effects. Pain limiting ROM today reaching 102 degrees and lacks 8 degrees extension. Was unable to get an appointment last week. Pt is interested in extending POC and will see PT next visit for re-evaluation. Patient would benefit from continued skilled PT to progress her mobility and strength in order to reduce pain and maximize functional ability.    OBJECTIVE IMPAIRMENTS: decreased activity  tolerance, decreased balance, decreased mobility, difficulty walking, decreased ROM, decreased strength, and pain  ACTIVITY LIMITATIONS: sitting, standing, squatting, stairs, transfers, and locomotion level  PARTICIPATION LIMITATIONS: cleaning, driving, shopping, community activity, and yard work  PERSONAL FACTORS: Fitness and 3+ comorbidities: CVA, CKD, Osteoporosis, HTN  are also affecting patient's functional outcome.    GOALS: Goals reviewed with patient? No  SHORT TERM GOALS: Target date: 04/21/2023   Pt will be compliant and knowledgeable  with initial HEP for improved comfort and carryover Baseline: initial HEP given  05/12/2023: independent Goal status: MET  2.  Pt will self report left knee pain no greater than 6/10 for improved comfort and functional ability Baseline: 10/10 at worst 05/12/2023: 10/10 at worst Goal status: ONGOING  LONG TERM GOALS: Target date: 06/09/2023   Pt will improve FOTO function score to no less than 59% as proxy for functional improvement Baseline: 49% function 05/07/2023: 63% Goal status: MET  2.  Pt will self report left knee pain no greater than 3/10 for improved comfort and functional ability Baseline: 10/10 at worst 05/12/2023: 10/10 at worst 06/09/23: 8/10 Goal status: ONGOING  3.  Pt will increase 30 Second Sit to Stand rep count to no less than 9 reps (MCID 2 reps) for improved balance, strength, and functional mobility Baseline: 7 reps with UE 05/12/2023: 8 reps without UE support  Goal status: ONGOING  4.  Pt will decrease TUG time score to no greater than 18 seconds with LRAD for improved balance and functional mobility at home and community Baseline: 29 seconds with FWW 05/12/2023: 21 seconds with rollator Goal status: ONGOING  5.  Pt will improve L knee AROM to range of at least 0-115 degrees for improving functional mobility and community navigation post sugery Baseline: see ROM chart 05/12/2023: 10-110 deg Goal status:  ONGOING   PLAN: PT FREQUENCY: 2x/week  PT DURATION: 10 weeks  PLANNED INTERVENTIONS: Therapeutic exercises, Therapeutic activity, Neuromuscular re-education, Balance training, Gait training, Patient/Family education, Self Care, Joint mobilization, Dry Needling, Electrical stimulation, Cryotherapy, Moist heat, Vasopneumatic device, Manual therapy, and Re-evaluation  PLAN FOR NEXT SESSION: assess HEP response, L knee ROM, LE strengthening, gait training    Jannette Spanner, PTA 06/09/23 2:16 PM Phone: [phone] Fax: [phone]

## 2023-06-16 ENCOUNTER — Ambulatory Visit: Payer: Medicare HMO

## 2023-06-16 DIAGNOSIS — G8929 Other chronic pain: Secondary | ICD-10-CM

## 2023-06-16 DIAGNOSIS — R2689 Other abnormalities of gait and mobility: Secondary | ICD-10-CM

## 2023-06-16 DIAGNOSIS — M6281 Muscle weakness (generalized): Secondary | ICD-10-CM

## 2023-06-16 DIAGNOSIS — M25562 Pain in left knee: Secondary | ICD-10-CM | POA: Diagnosis not present

## 2023-06-16 NOTE — Therapy (Signed)
OUTPATIENT PHYSICAL THERAPY TREATMENT/RE-CERTIFICATION    Patient Name: Alison Whitehead MRN: [id number] DOB:1951/04/06, 72 y.o., female Today's Date: 06/16/2023   END OF SESSION:  PT End of Session - 06/16/23 1105     Visit Number 14    Number of Visits 26    Date for PT Re-Evaluation 07/28/23    Authorization Type [employer] MCR    Progress Note Due on Visit 20    PT Start Time 1015    PT Stop Time 1055    PT Time Calculation (min) 40 min    Activity Tolerance Patient tolerated treatment well    Behavior During Therapy WFL for tasks assessed/performed                        Past Medical History:  Diagnosis Date   Allergy    seasonal   Arthritis    CKD (chronic kidney disease)    DDD (degenerative disc disease)    Degenerative disc disease, lumbar    Hypertension    IBS (irritable bowel syndrome)    Osteopenia    Osteoporosis    osteopenia   Stroke (HCC) 09/2019   Past Surgical History:  Procedure Laterality Date   SHOULDER SURGERY Left    lipoma   TOTAL KNEE ARTHROPLASTY Left 12/26/2022   Procedure: LEFT TOTAL KNEE ARTHROPLASTY;  Surgeon: Nadara Mustard, MD;  Location: [facility] OR;  Service: Orthopedics;  Laterality: Left;   Patient Active Problem List   Diagnosis Date Noted   Total knee replacement status, 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   Rotator cuff syndrome, right 01/11/2020   Labile blood glucose    Vascular headache    Small vessel disease, cerebrovascular    Prediabetes    AKI (acute kidney injury) (HCC)    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   Acute cerebrovascular accident (CVA) (HCC) 09/30/2019   Unilateral primary osteoarthritis, left knee 03/17/2019   Unilateral primary osteoarthritis, right knee 03/17/2019   Bilateral primary osteoarthritis of knee 03/10/2019   INSOMNIA, CHRONIC 05/19/2007   SPONDYLOSIS, LUMBAR 05/19/2007   Essential hypertension 05/03/2007   ARTHRITIS, KNEE 05/03/2007    PCP: Burnice Logan, PA  REFERRING PROVIDER: Nadara Mustard, MD   REFERRING DIAG: [phone] (ICD-10-CM) - Status post total left knee replacement   THERAPY DIAG:  Chronic pain of left knee  Muscle weakness (generalized)  Other abnormalities of gait and mobility  Rationale for Evaluation and Treatment: Rehabilitation  ONSET DATE: 12/26/2022   SUBJECTIVE:  SUBJECTIVE STATEMENT: Pt presents to PT with reports of continued knee pain. Has continued HEP compliance. Notes severe pain in non-surgery knee.   PERTINENT HISTORY: CVA, CKD, Osteoporosis, HTN  PAIN:  Are you having pain?  Yes: NPRS scale: 7/10 L, 9/10 R Worst: 10/10  Pain location: Left knee and right Pain description: Tight, sharp Aggravating factors: Standing, walking, cooking Relieving factors: Medication, rest  PRECAUTIONS: None  WEIGHT BEARING RESTRICTIONS: No  PATIENT GOALS: improve mobility in left knee in order to get around the home better and decrease pain   OBJECTIVE:  PATIENT SURVEYS:  FOTO: 49% function; 59% predicted   05/07/2023: 63% (met goal)  POSTURE: rounded shoulders and forward head  PALPATION: TTP to L distal quad  LOWER EXTREMITY ROM:  Active ROM Left 04/09/2023 Left 04/14/2023 Left 04/16/2023 Left 04/23/2023 Left  04/29/23 Left 05/07/2023 Left 05/12/2023 Left 05/21/2023 Left 05/26/2023 Left 06/09/23 Left 06/16/23  Hip flexion             Hip  extension             Hip abduction             Hip adduction             Hip internal rotation             Hip external rotation             Knee flexion 107 deg  104 deg 107 PROM 75 99 110 110 110 102   Knee extension Lacking 6 deg Lacking 5 deg Lacking 4 deg  15 Lacking 10 Lacking 10 Lacking 8  Lacking 8   Ankle dorsiflexion             Ankle plantarflexion             Ankle inversion             Ankle eversion              (Blank rows = not tested)  LOWER EXTREMITY MMT:  MMT Right eval Left eval Left 05/26/2023  Hip flexion     Hip extension     Hip abduction     Hip  adduction     Hip internal rotation     Hip external rotation     Knee flexion   4  Knee extension   4  Ankle dorsiflexion     Ankle plantarflexion     Ankle inversion     Ankle eversion      (Blank rows = not tested)  LOWER EXTREMITY SPECIAL TESTS:  DNT  FUNCTIONAL TESTS:  30 Second Sit to Stand: 7 reps - with UE  05/12/2023: 8 reps without UE support  06/09/23: 8 reps without UE support   06/16/2023: 9 reps without UE  TUG: 29 seconds with FWW  05/12/2023: 21 seconds with rollator  06/16/2023: 25 seconds with SPC   GAIT: Distance walked: 32ft Assistive device utilized: Environmental consultant - 2 wheeled Level of assistance: Modified independence Comments: antalgic gait L, decreased L knee extension   TREATMENT: [facility] Adult PT Treatment:                                                DATE: 06/16/23 Therapeutic Exercise: Nustep lvl 6 x 5 min while taking subjective Supine QS 2x10 L - 5"  SLR 2x10 L Supine heel slide with strap 2x10 - 5" hold L LAQ 2x15 3# Manual Therapy: A/P mobs for flexion and extension.  Patella mobs all planes/ Passive knee flexion and extension supine Therapeutic Activity: Assessment of tests/measures, goals and outcomes for recertification   PATIENT EDUCATION:  Education details: HEP Person educated: Patient Education method: Programmer, multimedia, Demonstration Education comprehension: verbalized understanding and returned demonstration  HOME EXERCISE PROGRAM: Access Code: [email or web address]   ASSESSMENT: CLINICAL IMPRESSION: Pt was able to complete all prescribed exercises with no adverse effect. Over the course of PT treatment she has been progressing well, especially compared to condition at evaluation. Recent ROM complicated secondary to fall at home. She has gotten back to a better level of ROM and shows improving functional mobility. She continues to need skilled PT as she is not operating at a baseline PLOF. Pt in agreement with current POC, will continue to progress  as able.    OBJECTIVE IMPAIRMENTS: decreased activity tolerance, decreased balance, decreased mobility, difficulty  walking, decreased ROM, decreased strength, and pain  ACTIVITY LIMITATIONS: sitting, standing, squatting, stairs, transfers, and locomotion level  PARTICIPATION LIMITATIONS: cleaning, driving, shopping, community activity, and yard work  PERSONAL FACTORS: Fitness and 3+ comorbidities: CVA, CKD, Osteoporosis, HTN  are also affecting patient's functional outcome.    GOALS: Goals reviewed with patient? No  SHORT TERM GOALS: Target date: 04/21/2023   Pt will be compliant and knowledgeable with initial HEP for improved comfort and carryover Baseline: initial HEP given  05/12/2023: independent Goal status: MET  2.  Pt will self report left knee pain no greater than 6/10 for improved comfort and functional ability Baseline: 10/10 at worst 05/12/2023: 10/10 at worst Goal status: ONGOING  LONG TERM GOALS: Target date: 07/28/2023    Pt will improve FOTO function score to no less than 59% as proxy for functional improvement Baseline: 49% function 05/07/2023: 63% Goal status: MET  2.  Pt will self report left knee pain no greater than 3/10 for improved comfort and functional ability Baseline: 10/10 at worst 05/12/2023: 10/10 at worst 06/09/23: 8/10 06/16/2023: 8/10 Goal status: ONGOING  3.  Pt will increase 30 Second Sit to Stand rep count to no less than 9 reps (MCID 2 reps) for improved balance, strength, and functional mobility Baseline: 7 reps with UE 05/12/2023: 8 reps without UE support 06/16/2023: 9 reps no UE Goal status: MET  4.  Pt will decrease TUG time score to no greater than 18 seconds with LRAD for improved balance and functional mobility at home and community Baseline: 29 seconds with FWW 05/12/2023: 21 seconds with rollator 06/16/2023: 25 seconds with SPC Goal status: ONGOING  5.  Pt will improve L knee AROM to range of at least 0-115 degrees for  improving functional mobility and community navigation post sugery Baseline: see ROM chart 05/12/2023: 10-110 deg 06/16/2023: 4-110 deg Goal status: ONGOING   PLAN: PT FREQUENCY: 2x/week  PT DURATION: 6 weeks  PLANNED INTERVENTIONS: Therapeutic exercises, Therapeutic activity, Neuromuscular re-education, Balance training, Gait training, Patient/Family education, Self Care, Joint mobilization, Dry Needling, Electrical stimulation, Cryotherapy, Moist heat, Vasopneumatic device, Manual therapy, and Re-evaluation  PLAN FOR NEXT SESSION: assess HEP response, L knee ROM, LE strengthening, gait training    Eloy End PT  06/16/23 1:18 PM

## 2023-06-23 ENCOUNTER — Ambulatory Visit: Payer: Medicare HMO

## 2023-06-23 DIAGNOSIS — G8929 Other chronic pain: Secondary | ICD-10-CM

## 2023-06-23 DIAGNOSIS — M6281 Muscle weakness (generalized): Secondary | ICD-10-CM

## 2023-06-23 DIAGNOSIS — R2689 Other abnormalities of gait and mobility: Secondary | ICD-10-CM

## 2023-06-23 DIAGNOSIS — M25562 Pain in left knee: Secondary | ICD-10-CM | POA: Diagnosis not present

## 2023-06-23 NOTE — Therapy (Signed)
OUTPATIENT PHYSICAL THERAPY TREATMENT/RE-CERTIFICATION    Patient Name: Alison Whitehead MRN: [id number] DOB:[birth year], 72 y.o., female Today's Date: 06/23/2023   END OF SESSION:  PT End of Session - 06/23/23 1016     Visit Number 15    Number of Visits 26    Date for PT Re-Evaluation 07/28/23    Authorization Type [employer] MCR    Progress Note Due on Visit 20    PT Start Time 1016    PT Stop Time 1055    PT Time Calculation (min) 39 min    Activity Tolerance Patient tolerated treatment well    Behavior During Therapy WFL for tasks assessed/performed                         Past Medical History:  Diagnosis Date   Allergy    seasonal   Arthritis    CKD (chronic kidney disease)    DDD (degenerative disc disease)    Degenerative disc disease, lumbar    Hypertension    IBS (irritable bowel syndrome)    Osteopenia    Osteoporosis    osteopenia   Stroke (HCC) 09/2019   Past Surgical History:  Procedure Laterality Date   SHOULDER SURGERY Left    lipoma   TOTAL KNEE ARTHROPLASTY Left 12/26/2022   Procedure: LEFT TOTAL KNEE ARTHROPLASTY;  Surgeon: Nadara Mustard, MD;  Location: [facility] OR;  Service: Orthopedics;  Laterality: Left;   Patient Active Problem List   Diagnosis Date Noted   Total knee replacement status, 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   Rotator cuff syndrome, right 01/11/2020   Labile blood glucose    Vascular headache    Small vessel disease, cerebrovascular    Prediabetes    AKI (acute kidney injury) (HCC)    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   Acute cerebrovascular accident (CVA) (HCC) 09/30/2019   Unilateral primary osteoarthritis, left knee 03/17/2019   Unilateral primary osteoarthritis, right knee 03/17/2019   Bilateral primary osteoarthritis of knee 03/10/2019   INSOMNIA, CHRONIC 05/19/2007   SPONDYLOSIS, LUMBAR 05/19/2007   Essential hypertension 05/03/2007   ARTHRITIS, KNEE 05/03/2007    PCP: Burnice Logan, PA  REFERRING PROVIDER:  Nadara Mustard, MD   REFERRING DIAG: [phone] (ICD-10-CM) - Status post total left knee replacement   THERAPY DIAG:  Chronic pain of left knee  Muscle weakness (generalized)  Other abnormalities of gait and mobility  Rationale for Evaluation and Treatment: Rehabilitation  ONSET DATE: 12/26/2022   SUBJECTIVE:  SUBJECTIVE STATEMENT: Pt presents to PT with reports of continued knee pain. Has continued HEP compliance. Notes severe pain in non-surgery knee continues.   PERTINENT HISTORY: CVA, CKD, Osteoporosis, HTN  PAIN:  Are you having pain?  Yes: NPRS scale: 7/10 L, 9/10 R Worst: 10/10  Pain location: Left knee and right Pain description: Tight, sharp Aggravating factors: Standing, walking, cooking Relieving factors: Medication, rest  PRECAUTIONS: None  WEIGHT BEARING RESTRICTIONS: No  PATIENT GOALS: improve mobility in left knee in order to get around the home better and decrease pain   OBJECTIVE:  PATIENT SURVEYS:  FOTO: 49% function; 59% predicted   05/07/2023: 63% (met goal)  POSTURE: rounded shoulders and forward head  PALPATION: TTP to L distal quad  LOWER EXTREMITY ROM:  Active ROM Left 04/09/2023 Left 04/14/2023 Left 04/16/2023 Left 04/23/2023 Left  04/29/23 Left 05/07/2023 Left 05/12/2023 Left 05/21/2023 Left 05/26/2023 Left 06/09/23 Left 06/16/23  Hip flexion  Hip extension             Hip abduction             Hip adduction             Hip internal rotation             Hip external rotation             Knee flexion 107 deg  104 deg 107 PROM 75 99 110 110 110 102   Knee extension Lacking 6 deg Lacking 5 deg Lacking 4 deg  15 Lacking 10 Lacking 10 Lacking 8  Lacking 8   Ankle dorsiflexion             Ankle plantarflexion             Ankle inversion             Ankle eversion              (Blank rows = not tested)  LOWER EXTREMITY MMT:  MMT Right eval Left eval Left 05/26/2023  Hip flexion     Hip extension     Hip abduction      Hip adduction     Hip internal rotation     Hip external rotation     Knee flexion   4  Knee extension   4  Ankle dorsiflexion     Ankle plantarflexion     Ankle inversion     Ankle eversion      (Blank rows = not tested)  LOWER EXTREMITY SPECIAL TESTS:  DNT  FUNCTIONAL TESTS:  30 Second Sit to Stand: 7 reps - with UE  05/12/2023: 8 reps without UE support  06/09/23: 8 reps without UE support   06/16/2023: 9 reps without UE  TUG: 29 seconds with FWW  05/12/2023: 21 seconds with rollator  06/16/2023: 25 seconds with SPC   GAIT: Distance walked: 69ft Assistive device utilized: Environmental consultant - 2 wheeled Level of assistance: Modified independence Comments: antalgic gait L, decreased L knee extension   TREATMENT: [facility] Adult PT Treatment:                                                DATE: 06/23/23 Therapeutic Exercise: Nustep lvl 5 x 5 min while taking subjective Supine QS x 10 L - 5"  SLR 2x10 L SAQ 2x10 2.5# Supine heel slide with strap x 15 - 5" hold L LAQ 3x10 2.5 # Standing hip abd 2x15 each - in // Step stretch knee flexion x 10 each TKE with ball x 10 R  [facility] Adult PT Treatment:                                                DATE: 06/16/23 Therapeutic Exercise: Nustep lvl 6 x 5 min while taking subjective Supine QS 2x10 L - 5"  SLR 2x10 L Supine heel slide with strap 2x10 - 5" hold L LAQ 2x15 3# Manual Therapy: A/P mobs for flexion and extension.  Patella mobs all planes/ Passive knee flexion and extension supine Therapeutic Activity: Assessment of tests/measures, goals and outcomes for recertification   PATIENT EDUCATION:  Education details: HEP Person educated: Patient  Education method: Explanation, Demonstration Education comprehension: verbalized understanding and returned demonstration  HOME EXERCISE PROGRAM: Access Code: [email or web address]   ASSESSMENT: CLINICAL IMPRESSION: Pt was able to complete all prescribed exercises with no adverse effect but  continues to have bilateral knee pain. Therapy today continued to focus on strengthening and improving L knee ROM post surgery. Pt is continuing to slowly progress with therapy, will continue per POC.   OBJECTIVE IMPAIRMENTS: decreased activity tolerance, decreased balance, decreased mobility, difficulty walking, decreased ROM, decreased strength, and pain  ACTIVITY LIMITATIONS: sitting, standing, squatting, stairs, transfers, and locomotion level  PARTICIPATION LIMITATIONS: cleaning, driving, shopping, community activity, and yard work  PERSONAL FACTORS: Fitness and 3+ comorbidities: CVA, CKD, Osteoporosis, HTN  are also affecting patient's functional outcome.    GOALS: Goals reviewed with patient? No  SHORT TERM GOALS: Target date: 04/21/2023   Pt will be compliant and knowledgeable with initial HEP for improved comfort and carryover Baseline: initial HEP given  05/12/2023: independent Goal status: MET  2.  Pt will self report left knee pain no greater than 6/10 for improved comfort and functional ability Baseline: 10/10 at worst 05/12/2023: 10/10 at worst Goal status: ONGOING  LONG TERM GOALS: Target date: 07/28/2023    Pt will improve FOTO function score to no less than 59% as proxy for functional improvement Baseline: 49% function 05/07/2023: 63% Goal status: MET  2.  Pt will self report left knee pain no greater than 3/10 for improved comfort and functional ability Baseline: 10/10 at worst 05/12/2023: 10/10 at worst 06/09/23: 8/10 06/16/2023: 8/10 Goal status: ONGOING  3.  Pt will increase 30 Second Sit to Stand rep count to no less than 9 reps (MCID 2 reps) for improved balance, strength, and functional mobility Baseline: 7 reps with UE 05/12/2023: 8 reps without UE support 06/16/2023: 9 reps no UE Goal status: MET  4.  Pt will decrease TUG time score to no greater than 18 seconds with LRAD for improved balance and functional mobility at home and community Baseline: 29  seconds with FWW 05/12/2023: 21 seconds with rollator 06/16/2023: 25 seconds with SPC Goal status: ONGOING  5.  Pt will improve L knee AROM to range of at least 0-115 degrees for improving functional mobility and community navigation post sugery Baseline: see ROM chart 05/12/2023: 10-110 deg 06/16/2023: 4-110 deg Goal status: ONGOING   PLAN: PT FREQUENCY: 2x/week  PT DURATION: 6 weeks  PLANNED INTERVENTIONS: Therapeutic exercises, Therapeutic activity, Neuromuscular re-education, Balance training, Gait training, Patient/Family education, Self Care, Joint mobilization, Dry Needling, Electrical stimulation, Cryotherapy, Moist heat, Vasopneumatic device, Manual therapy, and Re-evaluation  PLAN FOR NEXT SESSION: assess HEP response, L knee ROM, LE strengthening, gait training    Eloy End PT  06/23/23 10:56 AM

## 2023-07-01 ENCOUNTER — Ambulatory Visit: Payer: Medicare HMO

## 2023-07-07 ENCOUNTER — Ambulatory Visit: Payer: Medicare HMO | Attending: Physician Assistant

## 2023-07-07 DIAGNOSIS — M6281 Muscle weakness (generalized): Secondary | ICD-10-CM | POA: Insufficient documentation

## 2023-07-07 DIAGNOSIS — R2689 Other abnormalities of gait and mobility: Secondary | ICD-10-CM | POA: Diagnosis present

## 2023-07-07 DIAGNOSIS — G8929 Other chronic pain: Secondary | ICD-10-CM | POA: Diagnosis present

## 2023-07-07 DIAGNOSIS — M25562 Pain in left knee: Secondary | ICD-10-CM | POA: Diagnosis present

## 2023-07-07 NOTE — Therapy (Signed)
OUTPATIENT PHYSICAL THERAPY TREATMENT    Patient Name: Negin Rourke MRN: [id number] DOB:[birth year], 72 y.o., female Today's Date: 07/07/2023   END OF SESSION:  PT End of Session - 07/07/23 1021     Visit Number 16    Number of Visits 26    Date for PT Re-Evaluation 07/28/23    Authorization Type [employer] MCR    Progress Note Due on Visit 20    PT Start Time 1017    PT Stop Time 1055    PT Time Calculation (min) 38 min    Activity Tolerance Patient tolerated treatment well    Behavior During Therapy WFL for tasks assessed/performed                          Past Medical History:  Diagnosis Date   Allergy    seasonal   Arthritis    CKD (chronic kidney disease)    DDD (degenerative disc disease)    Degenerative disc disease, lumbar    Hypertension    IBS (irritable bowel syndrome)    Osteopenia    Osteoporosis    osteopenia   Stroke (HCC) 09/2019   Past Surgical History:  Procedure Laterality Date   SHOULDER SURGERY Left    lipoma   TOTAL KNEE ARTHROPLASTY Left 12/26/2022   Procedure: LEFT TOTAL KNEE ARTHROPLASTY;  Surgeon: Nadara Mustard, MD;  Location: [facility] OR;  Service: Orthopedics;  Laterality: Left;   Patient Active Problem List   Diagnosis Date Noted   Total knee replacement status, 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   Rotator cuff syndrome, right 01/11/2020   Labile blood glucose    Vascular headache    Small vessel disease, cerebrovascular    Prediabetes    AKI (acute kidney injury) (HCC)    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   Acute cerebrovascular accident (CVA) (HCC) 09/30/2019   Unilateral primary osteoarthritis, left knee 03/17/2019   Unilateral primary osteoarthritis, right knee 03/17/2019   Bilateral primary osteoarthritis of knee 03/10/2019   INSOMNIA, CHRONIC 05/19/2007   SPONDYLOSIS, LUMBAR 05/19/2007   Essential hypertension 05/03/2007   ARTHRITIS, KNEE 05/03/2007    PCP: Burnice Logan, PA  REFERRING PROVIDER: Nadara Mustard, MD    REFERRING DIAG: [phone] (ICD-10-CM) - Status post total left knee replacement   THERAPY DIAG:  Chronic pain of left knee  Muscle weakness (generalized)  Other abnormalities of gait and mobility  Rationale for Evaluation and Treatment: Rehabilitation  ONSET DATE: 12/26/2022   SUBJECTIVE:  SUBJECTIVE STATEMENT: Pt presents to PT with reports of continued knee pain and stiffness. Has been compliant with HEP.   PERTINENT HISTORY: CVA, CKD, Osteoporosis, HTN  PAIN:  Are you having pain?  Yes: NPRS scale: 7/10 L, 9/10 R Worst: 10/10  Pain location: Left knee and right Pain description: Tight, sharp Aggravating factors: Standing, walking, cooking Relieving factors: Medication, rest  PRECAUTIONS: None  WEIGHT BEARING RESTRICTIONS: No  PATIENT GOALS: improve mobility in left knee in order to get around the home better and decrease pain   OBJECTIVE:  PATIENT SURVEYS:  FOTO: 49% function; 59% predicted   05/07/2023: 63% (met goal)  POSTURE: rounded shoulders and forward head  PALPATION: TTP to L distal quad  LOWER EXTREMITY ROM:  Active ROM Left 05/07/2023 Left 05/12/2023 Left 05/21/2023 Left 05/26/2023 Left 06/09/23 Left 07/07/23  Hip flexion        Hip extension        Hip abduction  Hip adduction        Hip internal rotation        Hip external rotation        Knee flexion 99 110 110 110 102 105  Knee extension Lacking 10 Lacking 10 Lacking 8  Lacking 8 Lacking 3  Ankle dorsiflexion        Ankle plantarflexion        Ankle inversion        Ankle eversion         (Blank rows = not tested)  LOWER EXTREMITY MMT:  MMT Right eval Left eval Left 05/26/2023  Hip flexion     Hip extension     Hip abduction     Hip adduction     Hip internal rotation     Hip external rotation     Knee flexion   4  Knee extension   4  Ankle dorsiflexion     Ankle plantarflexion     Ankle inversion     Ankle eversion      (Blank rows = not tested)  LOWER  EXTREMITY SPECIAL TESTS:  DNT  FUNCTIONAL TESTS:  30 Second Sit to Stand: 7 reps - with UE  05/12/2023: 8 reps without UE support  06/09/23: 8 reps without UE support   06/16/2023: 9 reps without UE  TUG: 29 seconds with FWW  05/12/2023: 21 seconds with rollator  06/16/2023: 25 seconds with SPC   GAIT: Distance walked: 37ft Assistive device utilized: Environmental consultant - 2 wheeled Level of assistance: Modified independence Comments: antalgic gait L, decreased L knee extension   TREATMENT: [facility] Adult PT Treatment:                                                DATE: 07/07/23 Therapeutic Exercise: Nustep lvl 5 x 5 min while taking subjective LAQ 2x10 5# Seated heel slide x 15 L - 5" hold Supine QS with block x 15 L - 5"  SLR 2x10 L Supine heel slide with strap 2x10 - 5" hold L Lateral walk RTB x 2 laps in // Step 8in stretch knee flexion x 15 L TKE with ball x 10 ea  [facility] Adult PT Treatment:                                                DATE: 06/23/23 Therapeutic Exercise: Nustep lvl 5 x 5 min while taking subjective Supine QS x 10 L - 5"  SLR 2x10 L SAQ 2x10 2.5# Supine heel slide with strap x 15 - 5" hold L LAQ 3x10 2.5 # Standing hip abd 2x15 each - in // Step stretch knee flexion x 10 each TKE with ball x 10 R  [facility] Adult PT Treatment:                                                DATE: 06/16/23 Therapeutic Exercise: Nustep lvl 6 x 5 min while taking subjective Supine QS 2x10 L - 5"  SLR 2x10 L Supine heel slide with strap 2x10 - 5" hold L LAQ 2x15  3# Manual Therapy: A/P mobs for flexion and extension.  Patella mobs all planes/ Passive knee flexion and extension supine Therapeutic Activity: Assessment of tests/measures, goals and outcomes for recertification   PATIENT EDUCATION:  Education details: HEP Person educated: Patient Education method: Programmer, multimedia, Demonstration Education comprehension: verbalized understanding and returned demonstration  HOME EXERCISE  PROGRAM: Access Code: [email or web address]   ASSESSMENT: CLINICAL IMPRESSION: Pt was able to complete all prescribed exercises with no adverse effect but continues to have bilateral knee pain. Therapy today continued to focus on strengthening and improving L knee ROM post surgery. Pt is continuing to slowly progress with therapy, will continue per POC.   OBJECTIVE IMPAIRMENTS: decreased activity tolerance, decreased balance, decreased mobility, difficulty walking, decreased ROM, decreased strength, and pain  ACTIVITY LIMITATIONS: sitting, standing, squatting, stairs, transfers, and locomotion level  PARTICIPATION LIMITATIONS: cleaning, driving, shopping, community activity, and yard work  PERSONAL FACTORS: Fitness and 3+ comorbidities: CVA, CKD, Osteoporosis, HTN  are also affecting patient's functional outcome.    GOALS: Goals reviewed with patient? No  SHORT TERM GOALS: Target date: 04/21/2023   Pt will be compliant and knowledgeable with initial HEP for improved comfort and carryover Baseline: initial HEP given  05/12/2023: independent Goal status: MET  2.  Pt will self report left knee pain no greater than 6/10 for improved comfort and functional ability Baseline: 10/10 at worst 05/12/2023: 10/10 at worst Goal status: ONGOING  LONG TERM GOALS: Target date: 07/28/2023    Pt will improve FOTO function score to no less than 59% as proxy for functional improvement Baseline: 49% function 05/07/2023: 63% Goal status: MET  2.  Pt will self report left knee pain no greater than 3/10 for improved comfort and functional ability Baseline: 10/10 at worst 05/12/2023: 10/10 at worst 06/09/23: 8/10 06/16/2023: 8/10 Goal status: ONGOING  3.  Pt will increase 30 Second Sit to Stand rep count to no less than 9 reps (MCID 2 reps) for improved balance, strength, and functional mobility Baseline: 7 reps with UE 05/12/2023: 8 reps without UE support 06/16/2023: 9 reps no UE Goal status: MET  4.  Pt  will decrease TUG time score to no greater than 18 seconds with LRAD for improved balance and functional mobility at home and community Baseline: 29 seconds with FWW 05/12/2023: 21 seconds with rollator 06/16/2023: 25 seconds with SPC Goal status: ONGOING  5.  Pt will improve L knee AROM to range of at least 0-115 degrees for improving functional mobility and community navigation post sugery Baseline: see ROM chart 05/12/2023: 10-110 deg 06/16/2023: 4-110 deg Goal status: ONGOING   PLAN: PT FREQUENCY: 2x/week  PT DURATION: 6 weeks  PLANNED INTERVENTIONS: Therapeutic exercises, Therapeutic activity, Neuromuscular re-education, Balance training, Gait training, Patient/Family education, Self Care, Joint mobilization, Dry Needling, Electrical stimulation, Cryotherapy, Moist heat, Vasopneumatic device, Manual therapy, and Re-evaluation  PLAN FOR NEXT SESSION: assess HEP response, L knee ROM, LE strengthening, gait training    Eloy End PT  07/07/23 10:55 AM

## 2023-07-14 ENCOUNTER — Ambulatory Visit: Payer: Medicare HMO

## 2023-07-14 DIAGNOSIS — M25562 Pain in left knee: Secondary | ICD-10-CM | POA: Diagnosis not present

## 2023-07-14 DIAGNOSIS — G8929 Other chronic pain: Secondary | ICD-10-CM

## 2023-07-14 DIAGNOSIS — M6281 Muscle weakness (generalized): Secondary | ICD-10-CM

## 2023-07-14 DIAGNOSIS — R2689 Other abnormalities of gait and mobility: Secondary | ICD-10-CM

## 2023-07-14 NOTE — Therapy (Signed)
OUTPATIENT PHYSICAL THERAPY TREATMENT    Patient Name: Alison Whitehead MRN: [id number] DOB:[birth year], 72 y.o., female Today's Date: 07/14/2023   END OF SESSION:  PT End of Session - 07/14/23 1023     Visit Number 17    Number of Visits 26    Date for PT Re-Evaluation 07/28/23    Authorization Type [employer] MCR    Progress Note Due on Visit 20    PT Start Time 1023   arrived late   PT Stop Time 1058    PT Time Calculation (min) 35 min    Activity Tolerance Patient tolerated treatment well    Behavior During Therapy WFL for tasks assessed/performed                           Past Medical History:  Diagnosis Date   Allergy    seasonal   Arthritis    CKD (chronic kidney disease)    DDD (degenerative disc disease)    Degenerative disc disease, lumbar    Hypertension    IBS (irritable bowel syndrome)    Osteopenia    Osteoporosis    osteopenia   Stroke (HCC) 09/2019   Past Surgical History:  Procedure Laterality Date   SHOULDER SURGERY Left    lipoma   TOTAL KNEE ARTHROPLASTY Left 12/26/2022   Procedure: LEFT TOTAL KNEE ARTHROPLASTY;  Surgeon: Nadara Mustard, MD;  Location: [facility] OR;  Service: Orthopedics;  Laterality: Left;   Patient Active Problem List   Diagnosis Date Noted   Total knee replacement status, 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   Rotator cuff syndrome, right 01/11/2020   Labile blood glucose    Vascular headache    Small vessel disease, cerebrovascular    Prediabetes    AKI (acute kidney injury) (HCC)    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   Acute cerebrovascular accident (CVA) (HCC) 09/30/2019   Unilateral primary osteoarthritis, left knee 03/17/2019   Unilateral primary osteoarthritis, right knee 03/17/2019   Bilateral primary osteoarthritis of knee 03/10/2019   INSOMNIA, CHRONIC 05/19/2007   SPONDYLOSIS, LUMBAR 05/19/2007   Essential hypertension 05/03/2007   ARTHRITIS, KNEE 05/03/2007    PCP: Burnice Logan, PA  REFERRING PROVIDER:  Nadara Mustard, MD   REFERRING DIAG: [phone] (ICD-10-CM) - Status post total left knee replacement   THERAPY DIAG:  Chronic pain of left knee  Muscle weakness (generalized)  Other abnormalities of gait and mobility  Rationale for Evaluation and Treatment: Rehabilitation  ONSET DATE: 12/26/2022   SUBJECTIVE:  SUBJECTIVE STATEMENT: Pt presents to PT with reports of continued bilateral knee pain, R>L. She wants to have her R knee done in future. Has been compliant with HEP.   PERTINENT HISTORY: CVA, CKD, Osteoporosis, HTN  PAIN:  Are you having pain?  Yes: NPRS scale: 6/10 L, 8/10 R Worst: 10/10  Pain location: Left knee and right Pain description: Tight, sharp Aggravating factors: Standing, walking, cooking Relieving factors: Medication, rest  PRECAUTIONS: None  WEIGHT BEARING RESTRICTIONS: No  PATIENT GOALS: improve mobility in left knee in order to get around the home better and decrease pain   OBJECTIVE:  PATIENT SURVEYS:  FOTO: 49% function; 59% predicted   05/07/2023: 63% (met goal)  POSTURE: rounded shoulders and forward head  PALPATION: TTP to L distal quad  LOWER EXTREMITY ROM:  Active ROM Left 05/07/2023 Left 05/12/2023 Left 05/21/2023 Left 05/26/2023 Left 06/09/23 Left 07/07/23 Left 07/14/23  Hip flexion         Hip  extension         Hip abduction         Hip adduction         Hip internal rotation         Hip external rotation         Knee flexion 99 110 110 110 102 105 114 AA  Knee extension Lacking 10 Lacking 10 Lacking 8  Lacking 8 Lacking 3   Ankle dorsiflexion         Ankle plantarflexion         Ankle inversion         Ankle eversion          (Blank rows = not tested)  LOWER EXTREMITY MMT:  MMT Right eval Left eval Left 05/26/2023  Hip flexion     Hip extension     Hip abduction     Hip adduction     Hip internal rotation     Hip external rotation     Knee flexion   4  Knee extension   4  Ankle dorsiflexion     Ankle  plantarflexion     Ankle inversion     Ankle eversion      (Blank rows = not tested)  LOWER EXTREMITY SPECIAL TESTS:  DNT  FUNCTIONAL TESTS:  30 Second Sit to Stand: 7 reps - with UE  05/12/2023: 8 reps without UE support  06/09/23: 8 reps without UE support   06/16/2023: 9 reps without UE  TUG: 29 seconds with FWW  05/12/2023: 21 seconds with rollator  06/16/2023: 25 seconds with SPC   GAIT: Distance walked: 45ft Assistive device utilized: Environmental consultant - 2 wheeled Level of assistance: Modified independence Comments: antalgic gait L, decreased L knee extension   TREATMENT: [facility] Adult PT Treatment:                                                DATE: 07/14/23 Therapeutic Exercise: Nustep lvl 6 x 5 min while taking subjective Lateral walk RTB x 1 lap in // Standing hip abd x 10 RTB in // LAQ 2x10 5# Seated heel slide x 15 - 5" hold Supine heel slide with strap x 10 - 5" hold L STS x 10 - high table light UE support SLR 2x10  Seated hamstring stretch 2x30" L  [facility] Adult PT Treatment:                                                DATE: 07/07/23 Therapeutic Exercise: Nustep lvl 5 x 5 min while taking subjective LAQ 2x10 5# Seated heel slide x 15 L - 5" hold Supine QS with block x 15 L - 5"  SLR 2x10 L Supine heel slide with strap 2x10 - 5" hold L Lateral walk RTB x 2 laps in // Step 8in stretch knee flexion x 15 L TKE with ball x 10 ea  [facility] Adult PT Treatment:                                                DATE:  06/23/23 Therapeutic Exercise: Nustep lvl 5 x 5 min while taking subjective Supine QS x 10 L - 5"  SLR 2x10 L SAQ 2x10 2.5# Supine heel slide with strap x 15 - 5" hold L LAQ 3x10 2.5 # Standing hip abd 2x15 each - in // Step stretch knee flexion x 10 each TKE with ball x 10 R  [facility] Adult PT Treatment:                                                DATE: 06/16/23 Therapeutic Exercise: Nustep lvl 6 x 5 min while taking subjective Supine QS 2x10 L - 5"  SLR  2x10 L Supine heel slide with strap 2x10 - 5" hold L LAQ 2x15 3# Manual Therapy: A/P mobs for flexion and extension.  Patella mobs all planes/ Passive knee flexion and extension supine Therapeutic Activity: Assessment of tests/measures, goals and outcomes for recertification   PATIENT EDUCATION:  Education details: HEP Person educated: Patient Education method: Programmer, multimedia, Demonstration Education comprehension: verbalized understanding and returned demonstration  HOME EXERCISE PROGRAM: Access Code: [email or web address]   ASSESSMENT: CLINICAL IMPRESSION: Pt was able to complete all prescribed exercises with no adverse effect but continues to have bilateral knee pain, R>L. Of note she was able to once again increase L knee flexion with active assist to 114 degrees today. She is getting close to being at a functional level for L knee post TKA. At this point she is becoming more limited by her R knee. Will continue per POC as prescribed with progression as tolerated.   OBJECTIVE IMPAIRMENTS: decreased activity tolerance, decreased balance, decreased mobility, difficulty walking, decreased ROM, decreased strength, and pain  ACTIVITY LIMITATIONS: sitting, standing, squatting, stairs, transfers, and locomotion level  PARTICIPATION LIMITATIONS: cleaning, driving, shopping, community activity, and yard work  PERSONAL FACTORS: Fitness and 3+ comorbidities: CVA, CKD, Osteoporosis, HTN  are also affecting patient's functional outcome.    GOALS: Goals reviewed with patient? No  SHORT TERM GOALS: Target date: 04/21/2023   Pt will be compliant and knowledgeable with initial HEP for improved comfort and carryover Baseline: initial HEP given  05/12/2023: independent Goal status: MET  2.  Pt will self report left knee pain no greater than 6/10 for improved comfort and functional ability Baseline: 10/10 at worst 05/12/2023: 10/10 at worst Goal status: ONGOING  LONG TERM GOALS: Target date:  07/28/2023    Pt will improve FOTO function score to no less than 59% as proxy for functional improvement Baseline: 49% function 05/07/2023: 63% Goal status: MET  2.  Pt will self report left knee pain no greater than 3/10 for improved comfort and functional ability Baseline: 10/10 at worst 05/12/2023: 10/10 at worst 06/09/23: 8/10 06/16/2023: 8/10 Goal status: ONGOING  3.  Pt will increase 30 Second Sit to Stand rep count to no less than 9 reps (MCID 2 reps) for improved balance, strength, and functional mobility Baseline: 7 reps with UE 05/12/2023: 8 reps without UE support 06/16/2023: 9 reps no UE Goal status: MET  4.  Pt will decrease TUG time score to no greater than 18 seconds with LRAD for improved balance and functional mobility at home and community Baseline: 29 seconds with FWW 05/12/2023: 21 seconds with rollator 06/16/2023: 25 seconds with SPC Goal status: ONGOING  5.  Pt will improve L knee AROM to range of at least 0-115  degrees for improving functional mobility and community navigation post sugery Baseline: see ROM chart 05/12/2023: 10-110 deg 06/16/2023: 4-110 deg Goal status: ONGOING   PLAN: PT FREQUENCY: 2x/week  PT DURATION: 6 weeks  PLANNED INTERVENTIONS: Therapeutic exercises, Therapeutic activity, Neuromuscular re-education, Balance training, Gait training, Patient/Family education, Self Care, Joint mobilization, Dry Needling, Electrical stimulation, Cryotherapy, Moist heat, Vasopneumatic device, Manual therapy, and Re-evaluation  PLAN FOR NEXT SESSION: assess HEP response, L knee ROM, LE strengthening, gait training    Eloy End PT  07/14/23 10:59 AM

## 2023-07-21 ENCOUNTER — Ambulatory Visit: Payer: Medicare HMO

## 2023-07-21 DIAGNOSIS — G8929 Other chronic pain: Secondary | ICD-10-CM

## 2023-07-21 DIAGNOSIS — M25562 Pain in left knee: Secondary | ICD-10-CM | POA: Diagnosis not present

## 2023-07-21 DIAGNOSIS — R2689 Other abnormalities of gait and mobility: Secondary | ICD-10-CM

## 2023-07-21 DIAGNOSIS — M6281 Muscle weakness (generalized): Secondary | ICD-10-CM

## 2023-07-21 NOTE — Therapy (Signed)
OUTPATIENT PHYSICAL THERAPY TREATMENT    Patient Name: Alison Whitehead MRN: [id number] DOB:1951/07/18, 72 y.o., female Today's Date: 07/21/2023   END OF SESSION:  PT End of Session - 07/21/23 1024     Visit Number 18    Number of Visits 26    Date for PT Re-Evaluation 07/28/23    Authorization Type [employer] MCR    Progress Note Due on Visit 20    PT Start Time 1022   arrived late   PT Stop Time 1055    PT Time Calculation (min) 33 min    Activity Tolerance Patient tolerated treatment well    Behavior During Therapy WFL for tasks assessed/performed                            Past Medical History:  Diagnosis Date   Allergy    seasonal   Arthritis    CKD (chronic kidney disease)    DDD (degenerative disc disease)    Degenerative disc disease, lumbar    Hypertension    IBS (irritable bowel syndrome)    Osteopenia    Osteoporosis    osteopenia   Stroke (HCC) 09/2019   Past Surgical History:  Procedure Laterality Date   SHOULDER SURGERY Left    lipoma   TOTAL KNEE ARTHROPLASTY Left 12/26/2022   Procedure: LEFT TOTAL KNEE ARTHROPLASTY;  Surgeon: Nadara Mustard, MD;  Location: [facility] OR;  Service: Orthopedics;  Laterality: Left;   Patient Active Problem List   Diagnosis Date Noted   Total knee replacement status, 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   Rotator cuff syndrome, right 01/11/2020   Labile blood glucose    Vascular headache    Small vessel disease, cerebrovascular    Prediabetes    AKI (acute kidney injury) (HCC)    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   Acute cerebrovascular accident (CVA) (HCC) 09/30/2019   Unilateral primary osteoarthritis, left knee 03/17/2019   Unilateral primary osteoarthritis, right knee 03/17/2019   Bilateral primary osteoarthritis of knee 03/10/2019   INSOMNIA, CHRONIC 05/19/2007   SPONDYLOSIS, LUMBAR 05/19/2007   Essential hypertension 05/03/2007   ARTHRITIS, KNEE 05/03/2007    PCP: Burnice Logan, PA  REFERRING PROVIDER:  Nadara Mustard, MD   REFERRING DIAG: [phone] (ICD-10-CM) - Status post total left knee replacement   THERAPY DIAG:  Chronic pain of left knee  Muscle weakness (generalized)  Other abnormalities of gait and mobility  Rationale for Evaluation and Treatment: Rehabilitation  ONSET DATE: 12/26/2022   SUBJECTIVE:  SUBJECTIVE STATEMENT: Pt presents to PT with reports of decrease knee pain. Has been compliant with HEP.   PERTINENT HISTORY: CVA, CKD, Osteoporosis, HTN  PAIN:  Are you having pain?  Yes: NPRS scale: 4/10 L, 6/10 R Worst: 10/10  Pain location: Left knee and right Pain description: Tight, sharp Aggravating factors: Standing, walking, cooking Relieving factors: Medication, rest  PRECAUTIONS: None  WEIGHT BEARING RESTRICTIONS: No  PATIENT GOALS: improve mobility in left knee in order to get around the home better and decrease pain   OBJECTIVE:  PATIENT SURVEYS:  FOTO: 49% function; 59% predicted   05/07/2023: 63% (met goal)  POSTURE: rounded shoulders and forward head  PALPATION: TTP to L distal quad  LOWER EXTREMITY ROM:  Active ROM Left 05/07/2023 Left 05/12/2023 Left 05/21/2023 Left 05/26/2023 Left 06/09/23 Left 07/07/23 Left 07/14/23 Left 12/17  Hip flexion          Hip extension  Hip abduction          Hip adduction          Hip internal rotation          Hip external rotation          Knee flexion 99 110 110 110 102 105 114 AA 115 AA  Knee extension Lacking 10 Lacking 10 Lacking 8  Lacking 8 Lacking 3  Lacking 1  Ankle dorsiflexion          Ankle plantarflexion          Ankle inversion          Ankle eversion           (Blank rows = not tested)  LOWER EXTREMITY MMT:  MMT Right eval Left eval Left 05/26/2023  Hip flexion     Hip extension     Hip abduction     Hip adduction     Hip internal rotation     Hip external rotation     Knee flexion   4  Knee extension   4  Ankle dorsiflexion     Ankle plantarflexion     Ankle  inversion     Ankle eversion      (Blank rows = not tested)  LOWER EXTREMITY SPECIAL TESTS:  DNT  FUNCTIONAL TESTS:  30 Second Sit to Stand: 7 reps - with UE  05/12/2023: 8 reps without UE support  06/09/23: 8 reps without UE support   06/16/2023: 9 reps without UE  TUG: 29 seconds with FWW  05/12/2023: 21 seconds with rollator  06/16/2023: 25 seconds with SPC   GAIT: Distance walked: 69ft Assistive device utilized: Environmental consultant - 2 wheeled Level of assistance: Modified independence Comments: antalgic gait L, decreased L knee extension   TREATMENT: [facility] Adult PT Treatment:                                                DATE: 07/21/23 Therapeutic Exercise: Nustep lvl 5 x 3 min while taking subjective LAQ 2x15 5# each STS x 10 - high table light UE support L TKE GTB x 10 Step flexion stretch x 15 L 8in Supine heel slide with strap x 15 - 5" hold L Supine QS x 15 L - 5" hold SLR 2x10 L Seated hamstring stretch 2x30" L Standing hip abd x 10 each Standing mini squat x 10 - bilat UE support  [facility] Adult PT Treatment:                                                DATE: 07/14/23 Therapeutic Exercise: Nustep lvl 6 x 5 min while taking subjective Lateral walk RTB x 1 lap in // Standing hip abd x 10 RTB in // LAQ 2x10 5# Seated heel slide x 15 - 5" hold Supine heel slide with strap x 10 - 5" hold L STS x 10 - high table light UE support SLR 2x10  Seated hamstring stretch 2x30" L  [facility] Adult PT Treatment:  DATE: 07/07/23 Therapeutic Exercise: Nustep lvl 5 x 5 min while taking subjective LAQ 2x10 5# Seated heel slide x 15 L - 5" hold Supine QS with block x 15 L - 5"  SLR 2x10 L Supine heel slide with strap 2x10 - 5" hold L Lateral walk RTB x 2 laps in // Step 8in stretch knee flexion x 15 L TKE with ball x 10 ea  [facility] Adult PT Treatment:                                                DATE: 06/23/23 Therapeutic Exercise: Nustep lvl  5 x 5 min while taking subjective Supine QS x 10 L - 5"  SLR 2x10 L SAQ 2x10 2.5# Supine heel slide with strap x 15 - 5" hold L LAQ 3x10 2.5 # Standing hip abd 2x15 each - in // Step stretch knee flexion x 10 each TKE with ball x 10 R  [facility] Adult PT Treatment:                                                DATE: 06/16/23 Therapeutic Exercise: Nustep lvl 6 x 5 min while taking subjective Supine QS 2x10 L - 5"  SLR 2x10 L Supine heel slide with strap 2x10 - 5" hold L LAQ 2x15 3# Manual Therapy: A/P mobs for flexion and extension.  Patella mobs all planes/ Passive knee flexion and extension supine Therapeutic Activity: Assessment of tests/measures, goals and outcomes for recertification   PATIENT EDUCATION:  Education details: HEP Person educated: Patient Education method: Programmer, multimedia, Demonstration Education comprehension: verbalized understanding and returned demonstration  HOME EXERCISE PROGRAM: Access Code: [email or web address]   ASSESSMENT: CLINICAL IMPRESSION: Pt was able to complete all prescribed exercises with improved motion of L knee once again. We will assess her L knee long term goals at next session and determine necessary POC per findings. Will otherwise continue with current POC as prescribed with progression as tolerated.   OBJECTIVE IMPAIRMENTS: decreased activity tolerance, decreased balance, decreased mobility, difficulty walking, decreased ROM, decreased strength, and pain  ACTIVITY LIMITATIONS: sitting, standing, squatting, stairs, transfers, and locomotion level  PARTICIPATION LIMITATIONS: cleaning, driving, shopping, community activity, and yard work  PERSONAL FACTORS: Fitness and 3+ comorbidities: CVA, CKD, Osteoporosis, HTN  are also affecting patient's functional outcome.    GOALS: Goals reviewed with patient? No  SHORT TERM GOALS: Target date: 04/21/2023   Pt will be compliant and knowledgeable with initial HEP for improved comfort and  carryover Baseline: initial HEP given  05/12/2023: independent Goal status: MET  2.  Pt will self report left knee pain no greater than 6/10 for improved comfort and functional ability Baseline: 10/10 at worst 05/12/2023: 10/10 at worst Goal status: ONGOING  LONG TERM GOALS: Target date: 07/28/2023    Pt will improve FOTO function score to no less than 59% as proxy for functional improvement Baseline: 49% function 05/07/2023: 63% Goal status: MET  2.  Pt will self report left knee pain no greater than 3/10 for improved comfort and functional ability Baseline: 10/10 at worst 05/12/2023: 10/10 at worst 06/09/23: 8/10 06/16/2023: 8/10 Goal status: ONGOING  3.  Pt will increase 30 Second Sit to Stand rep count to no  less than 9 reps (MCID 2 reps) for improved balance, strength, and functional mobility Baseline: 7 reps with UE 05/12/2023: 8 reps without UE support 06/16/2023: 9 reps no UE Goal status: MET  4.  Pt will decrease TUG time score to no greater than 18 seconds with LRAD for improved balance and functional mobility at home and community Baseline: 29 seconds with FWW 05/12/2023: 21 seconds with rollator 06/16/2023: 25 seconds with SPC Goal status: ONGOING  5.  Pt will improve L knee AROM to range of at least 0-115 degrees for improving functional mobility and community navigation post sugery Baseline: see ROM chart 05/12/2023: 10-110 deg 06/16/2023: 4-110 deg Goal status: ONGOING   PLAN: PT FREQUENCY: 2x/week  PT DURATION: 6 weeks  PLANNED INTERVENTIONS: Therapeutic exercises, Therapeutic activity, Neuromuscular re-education, Balance training, Gait training, Patient/Family education, Self Care, Joint mobilization, Dry Needling, Electrical stimulation, Cryotherapy, Moist heat, Vasopneumatic device, Manual therapy, and Re-evaluation  PLAN FOR NEXT SESSION: assess HEP response, L knee ROM, LE strengthening, gait training    Eloy End PT  07/21/23 10:57 AM

## 2023-08-04 ENCOUNTER — Ambulatory Visit: Payer: Medicare HMO

## 2023-08-04 DIAGNOSIS — M25562 Pain in left knee: Secondary | ICD-10-CM | POA: Diagnosis not present

## 2023-08-04 DIAGNOSIS — R2689 Other abnormalities of gait and mobility: Secondary | ICD-10-CM

## 2023-08-04 DIAGNOSIS — G8929 Other chronic pain: Secondary | ICD-10-CM

## 2023-08-04 DIAGNOSIS — M6281 Muscle weakness (generalized): Secondary | ICD-10-CM

## 2023-08-04 NOTE — Therapy (Signed)
 OUTPATIENT PHYSICAL THERAPY TREATMENT/DISCHARGE  PHYSICAL THERAPY DISCHARGE SUMMARY  Visits from Start of Care: 19  Current functional level related to goals / functional outcomes: See goals and objective   Remaining deficits: See goals and objective   Education / Equipment: HEP   Patient agrees to discharge. Patient goals were  mostly met . Patient is being discharged due to maximized rehab potential.     Patient Name: Alison Whitehead MRN: [id number] DOB:October 30, 1950, 72 y.o., female Today's Date: 08/04/2023   END OF SESSION:  PT End of Session - 08/04/23 1059     Visit Number 19    Number of Visits 26    Date for PT Re-Evaluation 07/28/23    Authorization Type [employer] MCR    Progress Note Due on Visit 20    PT Start Time 1057    PT Stop Time 1136    PT Time Calculation (min) 39 min    Activity Tolerance Patient tolerated treatment well    Behavior During Therapy WFL for tasks assessed/performed                             Past Medical History:  Diagnosis Date   Allergy    seasonal   Arthritis    CKD (chronic kidney disease)    DDD (degenerative disc disease)    Degenerative disc disease, lumbar    Hypertension    IBS (irritable bowel syndrome)    Osteopenia    Osteoporosis    osteopenia   Stroke (HCC) 09/2019   Past Surgical History:  Procedure Laterality Date   SHOULDER SURGERY Left    lipoma   TOTAL KNEE ARTHROPLASTY Left 12/26/2022   Procedure: LEFT TOTAL KNEE ARTHROPLASTY;  Surgeon: Harden Jerona GAILS, MD;  Location: [facility] OR;  Service: Orthopedics;  Laterality: Left;   Patient Active Problem List   Diagnosis Date Noted   Total knee replacement status, 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   Rotator cuff syndrome, right 01/11/2020   Labile blood glucose    Vascular headache    Small vessel disease, cerebrovascular    Prediabetes    AKI (acute kidney injury) (HCC)    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   Acute cerebrovascular accident (CVA)  (HCC) 09/30/2019   Unilateral primary osteoarthritis, left knee 03/17/2019   Unilateral primary osteoarthritis, right knee 03/17/2019   Bilateral primary osteoarthritis of knee 03/10/2019   INSOMNIA, CHRONIC 05/19/2007   SPONDYLOSIS, LUMBAR 05/19/2007   Essential hypertension 05/03/2007   ARTHRITIS, KNEE 05/03/2007    PCP: Bridgette Sluder, PA  REFERRING PROVIDER: Harden Jerona GAILS, MD   REFERRING DIAG: [phone] (ICD-10-CM) - Status post total left knee replacement   THERAPY DIAG:  Chronic pain of left knee  Muscle weakness (generalized)  Other abnormalities of gait and mobility  Rationale for Evaluation and Treatment: Rehabilitation  ONSET DATE: 12/26/2022   SUBJECTIVE:  SUBJECTIVE STATEMENT: Pt presents to PT with reports of bilateral knee pain. Does not have anything scheduled with Dr. Harden right now, wants to discuss her options for R knee. Has been compliant with HEP.  PERTINENT HISTORY: CVA, CKD, Osteoporosis, HTN  PAIN:  Are you having pain?  Yes: NPRS scale: 4/10 L, 6/10 R Worst: 10/10  Pain location: Left knee and right Pain description: Tight, sharp Aggravating factors: Standing, walking, cooking Relieving factors: Medication, rest  PRECAUTIONS: None  WEIGHT BEARING RESTRICTIONS: No  PATIENT GOALS: improve mobility in left knee in order to get around the home better and  decrease pain   OBJECTIVE:  PATIENT SURVEYS:  FOTO: 49% function; 59% predicted   05/07/2023: 63% (met goal)  POSTURE: rounded shoulders and forward head  PALPATION: TTP to L distal quad  LOWER EXTREMITY ROM:  Active ROM Left 05/07/2023 Left 05/12/2023 Left 05/21/2023 Left 05/26/2023 Left 06/09/23 Left 07/07/23 Left 07/14/23 Left 12/17 Left 08/04/23  Hip flexion           Hip extension           Hip abduction           Hip adduction           Hip internal rotation           Hip external rotation           Knee flexion 99 110 110 110 102 105 114 AA 115 AA 110 AA  Knee  extension Lacking 10 Lacking 10 Lacking 8  Lacking 8 Lacking 3  Lacking 1 Lacking 1  Ankle dorsiflexion           Ankle plantarflexion           Ankle inversion           Ankle eversion            (Blank rows = not tested)  LOWER EXTREMITY MMT:  MMT Right eval Left eval Left 05/26/2023  Hip flexion     Hip extension     Hip abduction     Hip adduction     Hip internal rotation     Hip external rotation     Knee flexion   4  Knee extension   4  Ankle dorsiflexion     Ankle plantarflexion     Ankle inversion     Ankle eversion      (Blank rows = not tested)  LOWER EXTREMITY SPECIAL TESTS:  DNT  FUNCTIONAL TESTS:  30 Second Sit to Stand: 7 reps - with UE  05/12/2023: 8 reps without UE support  06/09/23: 8 reps without UE support   06/16/2023: 9 reps without UE  TUG: 29 seconds with FWW  05/12/2023: 21 seconds with rollator  06/16/2023: 25 seconds with SPC  08/04/2023: 20 seconds with rollator  GAIT: Distance walked: 1ft Assistive device utilized: Environmental Consultant - 2 wheeled Level of assistance: Modified independence Comments: antalgic gait L, decreased L knee extension   TREATMENT: [facility] Adult PT Treatment:                                                DATE: 08/04/23 Therapeutic Exercise: Nustep lvl 5 x 3 min while taking subjective Supine QS with towel x 10 - 5 hold L Supine SLR 2x10 L Supine heel slide with strap 2x10 - 5 hold L LAQ 2x15 each STS 2x5 - high table light UE support Lateral walk RTB x 3 laps at counter Standing hip abd 2x10 RTB each Therapeutic Activity: Assessment of tests/measures, goals, and outcomes for discharge  [facility] Adult PT Treatment:                                                DATE: 07/21/23 Therapeutic Exercise: Nustep lvl 5 x 3 min while taking subjective LAQ 2x15  5# each STS x 10 - high table light UE support L TKE GTB x 10 Step flexion stretch x 15 L 8in Supine heel slide with strap x 15 - 5 hold L Supine QS x 15 L - 5  hold SLR 2x10 L Seated hamstring stretch 2x30 L Standing hip abd x 10 each Standing mini squat x 10 - bilat UE support  [facility] Adult PT Treatment:                                                DATE: 07/14/23 Therapeutic Exercise: Nustep lvl 6 x 5 min while taking subjective Lateral walk RTB x 1 lap in // Standing hip abd x 10 RTB in // LAQ 2x10 5# Seated heel slide x 15 - 5 hold Supine heel slide with strap x 10 - 5 hold L STS x 10 - high table light UE support SLR 2x10  Seated hamstring stretch 2x30 L  PATIENT EDUCATION:  Education details: HEP Person educated: Patient Education method: Programmer, Multimedia, Demonstration Education comprehension: verbalized understanding and returned demonstration  HOME EXERCISE PROGRAM: Access Code: [email or web address] URL: [email or web address] Date: 08/04/2023 Prepared by: Alm Kingdom  Exercises - Supine Quadricep Sets  - 7 x weekly - 2 sets - 10 reps - 5 sec hold - Active Straight Leg Raise with Quad Set  - 7 x weekly - 2 sets - 10 reps - Supine Heel Slide with Strap  - 7 x weekly - 2 sets - 10 reps - 5 sec hold - Seated Hamstring Stretch  - 7 x weekly - 2 reps - 30 sec hold - Seated Long Arc Quad  - 7 x weekly - 3 sets - 15 reps - Sit to Stand with Counter Support  - 7 x weekly - 2 sets - 5 reps - Side Stepping with Resistance at Ankles and Counter Support  - 7 x weekly - 3 reps - red band hold - Standing Hip Abduction with Resistance at Ankles and Counter Support  - 7 x weekly - 2 sets - 10 reps - red band hold   ASSESSMENT: CLINICAL IMPRESSION: Pt was able to complete all prescribed exercises and demonstrated knowledge of HEP with no adverse effect. Over the course of PT treatment she has progress well with her L TKA post op, meeting most LTGs and showing significant improvement in L knee ROM. At this point she is more limited by her R knee secondary to chronic R knee pain. She should continue to maintain improvements with HEP  compliance for her L knee. PT encouraged her to make appointment with Dr. Harden to discuss next steps and options for her R knee. Pt in agreement with current plan and is ready to discharge at this time.   OBJECTIVE IMPAIRMENTS: decreased activity tolerance, decreased balance, decreased mobility, difficulty walking, decreased ROM, decreased strength, and pain  ACTIVITY LIMITATIONS: sitting, standing, squatting, stairs, transfers, and locomotion level  PARTICIPATION LIMITATIONS: cleaning, driving, shopping, community activity, and yard work  PERSONAL FACTORS: Fitness and 3+ comorbidities: CVA, CKD, Osteoporosis, HTN  are also affecting patient's functional outcome.    GOALS: Goals reviewed with patient? No  SHORT TERM GOALS: Target date: 04/21/2023   Pt will be compliant and knowledgeable with initial HEP for improved comfort and carryover Baseline: initial HEP given  05/12/2023: independent Goal status: MET  2.  Pt will  self report left knee pain no greater than 6/10 for improved comfort and functional ability Baseline: 10/10 at worst 05/12/2023: 10/10 at worst 08/04/2023: 6/10 Goal status: MOSTLY MET  LONG TERM GOALS: Target date: 07/28/2023    Pt will improve FOTO function score to no less than 59% as proxy for functional improvement Baseline: 49% function 05/07/2023: 63% Goal status: MET  2.  Pt will self report left knee pain no greater than 3/10 for improved comfort and functional ability Baseline: 10/10 at worst 05/12/2023: 10/10 at worst 06/09/23: 8/10 06/16/2023: 8/10 08/04/2023: 6/10 Goal status: MOSTLY MET  3.  Pt will increase 30 Second Sit to Stand rep count to no less than 9 reps (MCID 2 reps) for improved balance, strength, and functional mobility Baseline: 7 reps with UE 05/12/2023: 8 reps without UE support 06/16/2023: 9 reps no UE Goal status: MET  4.  Pt will decrease TUG time score to no greater than 18 seconds with LRAD for improved balance and functional  mobility at home and community Baseline: 29 seconds with FWW 05/12/2023: 21 seconds with rollator 06/16/2023: 25 seconds with [facility] 08/04/2023: 20 seconds with rollator Goal status: PARTIALLY MET  5.  Pt will improve L knee AROM to range of at least 0-115 degrees for improving functional mobility and community navigation post sugery Baseline: see ROM chart 05/12/2023: 10-110 deg 06/16/2023: 4-110 deg 08/04/2023: 1-110  Goal status: MOSTLY MET   PLAN: PT FREQUENCY: 2x/week  PT DURATION: 6 weeks  PLANNED INTERVENTIONS: Therapeutic exercises, Therapeutic activity, Neuromuscular re-education, Balance training, Gait training, Patient/Family education, Self Care, Joint mobilization, Dry Needling, Electrical stimulation, Cryotherapy, Moist heat, Vasopneumatic device, Manual therapy, and Re-evaluation  PLAN FOR NEXT SESSION: assess HEP response, L knee ROM, LE strengthening, gait training    Alm JAYSON Kingdom PT  08/04/23 11:41 AM

## 2023-09-11 ENCOUNTER — Ambulatory Visit (INDEPENDENT_AMBULATORY_CARE_PROVIDER_SITE_OTHER): Payer: Medicare HMO | Admitting: Family

## 2023-09-11 DIAGNOSIS — M1711 Unilateral primary osteoarthritis, right knee: Secondary | ICD-10-CM

## 2023-09-11 DIAGNOSIS — Z96652 Presence of left artificial knee joint: Secondary | ICD-10-CM | POA: Diagnosis not present

## 2023-09-15 ENCOUNTER — Encounter: Payer: Self-pay | Admitting: Family

## 2023-09-15 NOTE — Progress Notes (Signed)
 Office Visit Note   Patient: Alison Whitehead           Date of Birth: 1951/04/09           MRN: [id number] Visit Date: 09/11/2023              Requested by: Bridgette Sluder, PA [address] [place],  [place] [address] PCP: Bridgette Sluder, PA  Chief Complaint  Patient presents with   Left Knee - Pain      HPI: The patient is a 73 year old woman who presents today complaining of bilateral knee pain.  Right worse than left.  She does have a history of osteoarthritis on the right as well as a total knee arthroplasty on the left.  She reports bilateral pain which is global to the knees she complains of some swelling in the right knee.  She has not had any recent injuries however she has had some giving way of both knees she fears falling she reports she is unable to take NSAIDs she has been taking Tylenol  without any improvement.  She has also completed physical therapy which has not been useful to her.  She has had previous Depo-Medrol  injections which did not provide her with any relief  Assessment & Plan: Visit Diagnoses: No diagnosis found.  Plan: Discussed osteoarthritis and strengthening for the total knee on the left.  She declined offer to have further physical therapy and strengthening on the left.  Declined Depo-Medrol  injection on the right.  We will proceed with prior authorization for a supplemental injection for the right knee.  Follow-Up Instructions: No follow-ups on file.   Ortho Exam  Patient is alert, oriented, no adenopathy, well-dressed, normal affect, normal respiratory effort. On examination right knee the medial joint line is moderately tender there is no crepitation with range of motion collaterals and cruciates are stable on examination left knee there is diffuse tenderness no erythema no warmth no edema collaterals and cruciates are stable. Imaging: No results found. No images are attached to the encounter.  Labs: Lab Results   Component Value Date   HGBA1C 6.1 (H) 12/22/2022   HGBA1C 6.2 (H) 10/02/2019   ESRSEDRATE 4 11/08/2007     Lab Results  Component Value Date   ALBUMIN 3.4 (L) 10/07/2019   ALBUMIN 3.5 10/01/2019   ALBUMIN 3.7 09/30/2019    No results found for: MG No results found for: VD25OH  No results found for: PREALBUMIN    Latest Ref Rng & Units 12/28/2022    7:03 AM 12/22/2022    9:30 AM 05/15/2020   12:26 PM  CBC EXTENDED  WBC 4.0 - 10.5 K/uL 8.6  5.5  4.9   RBC 3.87 - 5.11 MIL/uL 3.74  4.40  4.02   Hemoglobin 12.0 - 15.0 g/dL 89.6  87.8  88.8   HCT 36.0 - 46.0 % 33.8  40.5  37.9   Platelets 150 - 400 K/uL 176  261  221      There is no height or weight on file to calculate BMI.  Orders:  No orders of the defined types were placed in this encounter.  No orders of the defined types were placed in this encounter.    Procedures: No procedures performed  Clinical Data: No additional findings.  ROS:  All other systems negative, except as noted in the HPI. Review of Systems  Objective: Vital Signs: There were no vitals taken for this visit.  Specialty Comments:  No specialty comments available.  PMFS History: Patient Active Problem List   Diagnosis Date Noted   Total knee replacement status, 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   Rotator cuff syndrome, right 01/11/2020   Labile blood glucose    Vascular headache    Small vessel disease, cerebrovascular    Prediabetes    AKI (acute kidney injury) (HCC)    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   Acute cerebrovascular accident (CVA) (HCC) 09/30/2019   Unilateral primary osteoarthritis, left knee 03/17/2019   Unilateral primary osteoarthritis, right knee 03/17/2019   Bilateral primary osteoarthritis of knee 03/10/2019   INSOMNIA, CHRONIC 05/19/2007   SPONDYLOSIS, LUMBAR 05/19/2007   Essential hypertension 05/03/2007   ARTHRITIS, KNEE 05/03/2007   Past Medical History:  Diagnosis Date   Allergy    seasonal   Arthritis     CKD (chronic kidney disease)    DDD (degenerative disc disease)    Degenerative disc disease, lumbar    Hypertension    IBS (irritable bowel syndrome)    Osteopenia    Osteoporosis    osteopenia   Stroke (HCC) 09/2019    Family History  Problem Relation Age of Onset   Breast cancer Maternal Aunt    Diabetes Mother    Hypertension Mother    Glaucoma Mother    Other Father        accident   Diabetes Sister    Hypertension Sister    Cancer Sister    Colon cancer Neg Hx    Colon polyps Neg Hx    Esophageal cancer Neg Hx    Stomach cancer Neg Hx    Rectal cancer Neg Hx     Past Surgical History:  Procedure Laterality Date   SHOULDER SURGERY Left    lipoma   TOTAL KNEE ARTHROPLASTY Left 12/26/2022   Procedure: LEFT TOTAL KNEE ARTHROPLASTY;  Surgeon: Harden Jerona GAILS, MD;  Location: [facility] OR;  Service: Orthopedics;  Laterality: Left;   Social History   Occupational History    Comment: retired  Tobacco Use   Smoking status: Former    Current packs/day: 0.00    Types: Cigarettes    Quit date: 10/13/2019    Years since quitting: 3.9   Smokeless tobacco: Never  Vaping Use   Vaping status: Never Used  Substance and Sexual Activity   Alcohol use: Never   Drug use: Never   Sexual activity: Not Currently    Birth control/protection: None

## 2023-10-06 ENCOUNTER — Telehealth: Payer: Self-pay

## 2023-10-06 NOTE — Telephone Encounter (Signed)
 VOB has been submitted for Durolane, right knee

## 2023-10-06 NOTE — Telephone Encounter (Signed)
-----   Message from Adonis Huguenin sent at 09/11/2023 10:00 AM EST ----- Supp injection for righ tknee please

## 2024-03-02 ENCOUNTER — Other Ambulatory Visit (INDEPENDENT_AMBULATORY_CARE_PROVIDER_SITE_OTHER): Payer: Self-pay

## 2024-03-02 ENCOUNTER — Ambulatory Visit (INDEPENDENT_AMBULATORY_CARE_PROVIDER_SITE_OTHER): Admitting: Family

## 2024-03-02 DIAGNOSIS — Z96652 Presence of left artificial knee joint: Secondary | ICD-10-CM

## 2024-03-02 DIAGNOSIS — S83422A Sprain of lateral collateral ligament of left knee, initial encounter: Secondary | ICD-10-CM | POA: Diagnosis not present

## 2024-03-03 ENCOUNTER — Encounter: Payer: Self-pay | Admitting: Family

## 2024-03-03 NOTE — Progress Notes (Signed)
 Office Visit Note   Patient: Alison Whitehead           Date of Birth: 01-08-1951           MRN: [id number] Visit Date: 03/02/2024              Requested by: Bridgette Sluder, PA [address] [place],  [place] [address] PCP: Bridgette Sluder, PA  Chief Complaint  Patient presents with   Left Knee - Pain      HPI: The patient is a 73 year old woman who presents with a several week history of left knee pain she cannot recall any specific injury that she has endured she has not had any recent falls however she is having primarily lateral pain this is associated with some swelling she feels that she can palpate a knot on the lateral aspect of her knee she has had some fullness posteriorly  She is status post total knee arthroplasty on the left May of last year  She continues using a rolling walker would like to be able to get away from this 1 day she does use a cane some in the home but feels unstable without the walker for longer walking  Assessment & Plan: Visit Diagnoses:  1. Total knee replacement status, left     Plan: Suspected LCL strain.  Discussed importance of strengthening daily home exercise regimen.  Offered return to PT.  She would like to consider this after she returns from her upcoming trip to visit her grandchildren  Conservative measures discussed  Return demonstration of VMO strengthening exercises  Follow-Up Instructions: No follow-ups on file.   Left Knee Exam   Tenderness  The patient is experiencing tenderness in the lateral joint line.  Tests  Varus: negative Valgus: negative  Other  Erythema: absent Scars: present Sensation: normal Pulse: present Swelling: mild      Patient is alert, oriented, no adenopathy, well-dressed, normal affect, normal respiratory effort.     Imaging: No results found. No images are attached to the encounter.  Labs: Lab Results  Component Value Date   HGBA1C 6.1 (H) 12/22/2022   HGBA1C  6.2 (H) 10/02/2019   ESRSEDRATE 4 11/08/2007     Lab Results  Component Value Date   ALBUMIN 3.4 (L) 10/07/2019   ALBUMIN 3.5 10/01/2019   ALBUMIN 3.7 09/30/2019    No results found for: MG No results found for: VD25OH  No results found for: PREALBUMIN    Latest Ref Rng & Units 12/28/2022    7:03 AM 12/22/2022    9:30 AM 05/15/2020   12:26 PM  CBC EXTENDED  WBC 4.0 - 10.5 K/uL 8.6  5.5  4.9   RBC 3.87 - 5.11 MIL/uL 3.74  4.40  4.02   Hemoglobin 12.0 - 15.0 g/dL 89.6  87.8  88.8   HCT 36.0 - 46.0 % 33.8  40.5  37.9   Platelets 150 - 400 K/uL 176  261  221      There is no height or weight on file to calculate BMI.  Orders:  Orders Placed This Encounter  Procedures   XR Knee 1-2 Views Left   No orders of the defined types were placed in this encounter.    Procedures: No procedures performed  Clinical Data: No additional findings.  ROS:  All other systems negative, except as noted in the HPI. Review of Systems  Objective: Vital Signs: There were no vitals taken for this visit.  Specialty Comments:  No  specialty comments available.  PMFS History: Patient Active Problem List   Diagnosis Date Noted   Total knee replacement status, 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   Rotator cuff syndrome, right 01/11/2020   Labile blood glucose    Vascular headache    Small vessel disease, cerebrovascular    Prediabetes    AKI (acute kidney injury) (HCC)    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   Acute cerebrovascular accident (CVA) (HCC) 09/30/2019   Unilateral primary osteoarthritis, left knee 03/17/2019   Unilateral primary osteoarthritis, right knee 03/17/2019   Bilateral primary osteoarthritis of knee 03/10/2019   INSOMNIA, CHRONIC 05/19/2007   SPONDYLOSIS, LUMBAR 05/19/2007   Essential hypertension 05/03/2007   ARTHRITIS, KNEE 05/03/2007   Past Medical History:  Diagnosis Date   Allergy    seasonal   Arthritis    CKD (chronic kidney disease)    DDD (degenerative  disc disease)    Degenerative disc disease, lumbar    Hypertension    IBS (irritable bowel syndrome)    Osteopenia    Osteoporosis    osteopenia   Stroke (HCC) 09/2019    Family History  Problem Relation Age of Onset   Breast cancer Maternal Aunt    Diabetes Mother    Hypertension Mother    Glaucoma Mother    Other Father        accident   Diabetes Sister    Hypertension Sister    Cancer Sister    Colon cancer Neg Hx    Colon polyps Neg Hx    Esophageal cancer Neg Hx    Stomach cancer Neg Hx    Rectal cancer Neg Hx     Past Surgical History:  Procedure Laterality Date   SHOULDER SURGERY Left    lipoma   TOTAL KNEE ARTHROPLASTY Left 12/26/2022   Procedure: LEFT TOTAL KNEE ARTHROPLASTY;  Surgeon: Harden Jerona GAILS, MD;  Location: [facility] OR;  Service: Orthopedics;  Laterality: Left;   Social History   Occupational History    Comment: retired  Tobacco Use   Smoking status: Former    Current packs/day: 0.00    Types: Cigarettes    Quit date: 10/13/2019    Years since quitting: 4.3   Smokeless tobacco: Never  Vaping Use   Vaping status: Never Used  Substance and Sexual Activity   Alcohol use: Never   Drug use: Never   Sexual activity: Not Currently    Birth control/protection: None

## 2024-03-07 NOTE — Therapy (Incomplete)
 OUTPATIENT PHYSICAL THERAPY LOWER EXTREMITY EVALUATION   Patient Name: Alison Whitehead MRN: [id number] DOB:Dec 08, 1950, 73 y.o., female Today's Date: 03/07/2024  END OF SESSION:   Past Medical History:  Diagnosis Date   Allergy    seasonal   Arthritis    CKD (chronic kidney disease)    DDD (degenerative disc disease)    Degenerative disc disease, lumbar    Hypertension    IBS (irritable bowel syndrome)    Osteopenia    Osteoporosis    osteopenia   Stroke (HCC) 09/2019   Past Surgical History:  Procedure Laterality Date   SHOULDER SURGERY Left    lipoma   TOTAL KNEE ARTHROPLASTY Left 12/26/2022   Procedure: LEFT TOTAL KNEE ARTHROPLASTY;  Surgeon: Harden Jerona GAILS, MD;  Location: [facility] OR;  Service: Orthopedics;  Laterality: Left;   Patient Active Problem List   Diagnosis Date Noted   Total knee replacement status, 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   Rotator cuff syndrome, right 01/11/2020   Labile blood glucose    Vascular headache    Small vessel disease, cerebrovascular    Prediabetes    AKI (acute kidney injury) (HCC)    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   Acute cerebrovascular accident (CVA) (HCC) 09/30/2019   Unilateral primary osteoarthritis, left knee 03/17/2019   Unilateral primary osteoarthritis, right knee 03/17/2019   Bilateral primary osteoarthritis of knee 03/10/2019   INSOMNIA, CHRONIC 05/19/2007   SPONDYLOSIS, LUMBAR 05/19/2007   Essential hypertension 05/03/2007   ARTHRITIS, KNEE 05/03/2007    PCP: Bridgette Sluder, PA  REFERRING PROVIDER: Valdemar Rocky SAUNDERS, NP  REFERRING DIAG:  [phone] (ICD-10-CM) - Total knee replacement status, left  D16.577J (ICD-10-CM) - Sprain of lateral collateral ligament of left knee, initial encounter    THERAPY DIAG:  No diagnosis found.  Rationale for Evaluation and Treatment: Rehabilitation  ONSET DATE: 12/26/22  SUBJECTIVE:   SUBJECTIVE STATEMENT: ***  PERTINENT HISTORY: *** PAIN:  Are you having pain? Yes: NPRS scale:  *** Pain location: *** Pain description: *** Aggravating factors: *** Relieving factors: ***  PRECAUTIONS: {Therapy precautions:24002}  RED FLAGS: {PT Red Flags:29287}   WEIGHT BEARING RESTRICTIONS: {Yes ***/No:24003}  FALLS:  Has patient fallen in last 6 months? {fallsyesno:27318}  LIVING ENVIRONMENT: Lives with: {[facility] lives with:25569::lives with their family} Lives in: {Lives in:25570} Stairs: {opstairs:27293} Has following equipment at home: {Assistive devices:23999}  OCCUPATION: ***  PLOF: {PLOF:24004}  PATIENT GOALS: ***  NEXT MD VISIT: ***  OBJECTIVE:  Note: Objective measures were completed at Evaluation unless otherwise noted.  DIAGNOSTIC FINDINGS:  03/02/24 X-ray L Knee Radiographs of the left knee show stable alignment of total knee  arthroplasty hardware there is no complicating features.  There is  end-stage osteoarthritis present to the right knee.  With bone-on-bone  contact.   PATIENT SURVEYS:  {rehab surveys:24030}  COGNITION: Overall cognitive status: {cognition:24006}     SENSATION: {sensation:27233}  EDEMA:  {edema:24020}  MUSCLE LENGTH: Hamstrings: Right *** deg; Left *** deg Debby test: Right *** deg; Left *** deg  POSTURE: {posture:25561}  PALPATION: ***  LOWER EXTREMITY ROM:  {AROM/PROM:27142} ROM Right eval Left eval  Hip flexion    Hip extension    Hip abduction    Hip adduction    Hip internal rotation    Hip external rotation    Knee flexion    Knee extension    Ankle dorsiflexion    Ankle plantarflexion    Ankle inversion    Ankle eversion     (Blank rows = not tested)  LOWER EXTREMITY MMT:  MMT Right eval Left eval  Hip flexion    Hip extension    Hip abduction    Hip adduction    Hip internal rotation    Hip external rotation    Knee flexion    Knee extension    Ankle dorsiflexion    Ankle plantarflexion    Ankle inversion    Ankle eversion     (Blank rows = not tested)  LOWER EXTREMITY  SPECIAL TESTS:  {LEspecialtests:26242}  FUNCTIONAL TESTS:  {Functional tests:24029}  GAIT: Distance walked: *** Assistive device utilized: {Assistive devices:23999} Level of assistance: {Levels of assistance:24026} Comments: ***                                                                                                                                TREATMENT DATE: [facility] Adult PT Treatment:                                                DATE: 03/08/24 Therapeutic Exercise: *** Manual Therapy: *** Neuromuscular re-ed: *** Therapeutic Activity: *** Modalities: *** Self Care: ***     PATIENT EDUCATION:  Education details: *** Person educated: {Person educated:25204} Education method: {Education Method:25205} Education comprehension: {Education Comprehension:25206}  HOME EXERCISE PROGRAM: ***  ASSESSMENT:  CLINICAL IMPRESSION: Patient is a 73 y.o. female who was seen today for physical therapy evaluation and treatment for  Z96.652 (ICD-10-CM) - Total knee replacement status, left  S83.422A (ICD-10-CM) - Sprain of lateral collateral ligament of left knee, initial encounter  .   OBJECTIVE IMPAIRMENTS: {opptimpairments:25111}.   ACTIVITY LIMITATIONS: {activitylimitations:27494}  PARTICIPATION LIMITATIONS: {participationrestrictions:25113}  PERSONAL FACTORS: {Personal factors:25162} are also affecting patient's functional outcome.   REHAB POTENTIAL: {rehabpotential:25112}  CLINICAL DECISION MAKING: {clinical decision making:25114}  EVALUATION COMPLEXITY: {Evaluation complexity:25115}   GOALS:  SHORT TERM GOALS: Target date: *** *** Baseline: Goal status: INITIAL  2.  *** Baseline:  Goal status: INITIAL  3.  *** Baseline:  Goal status: INITIAL  4.  *** Baseline:  Goal status: INITIAL  5.  *** Baseline:  Goal status: INITIAL  6.  *** Baseline:  Goal status: INITIAL  LONG TERM GOALS: Target date: ***  *** Baseline:  Goal status:  INITIAL  2.  *** Baseline:  Goal status: INITIAL  3.  *** Baseline:  Goal status: INITIAL  4.  *** Baseline:  Goal status: INITIAL  5.  *** Baseline:  Goal status: INITIAL  6.  *** Baseline:  Goal status: INITIAL   PLAN:  PT FREQUENCY: {rehab frequency:25116}  PT DURATION: {rehab duration:25117}  PLANNED INTERVENTIONS: {rehab planned interventions:25118::97110-Therapeutic exercises,97530- Therapeutic [phone]- Neuromuscular re-education,97535- Self Rjmz,02859- Manual therapy}  PLAN FOR NEXT SESSION: ***   Zale Marcotte, PT 03/07/2024, 8:00 PM

## 2024-03-08 ENCOUNTER — Ambulatory Visit

## 2024-03-17 NOTE — Therapy (Signed)
 OUTPATIENT PHYSICAL THERAPY LOWER EXTREMITY EVALUATION   Patient Name: Alison Whitehead MRN: [id number] DOB:1951-04-06, 72 y.o., female Today's Date: 03/19/2024  END OF SESSION:  PT End of Session - 03/18/24 1242     Visit Number 1    Number of Visits 13    Date for PT Re-Evaluation 05/06/24    Authorization Type [employer] MEDICARE HMO/PPO    PT Start Time 1230    PT Stop Time 1315    PT Time Calculation (min) 45 min    Activity Tolerance Patient tolerated treatment well;Patient limited by pain    Behavior During Therapy [facility] for tasks assessed/performed          Past Medical History:  Diagnosis Date   Allergy    seasonal   Arthritis    CKD (chronic kidney disease)    DDD (degenerative disc disease)    Degenerative disc disease, lumbar    Hypertension    IBS (irritable bowel syndrome)    Osteopenia    Osteoporosis    osteopenia   Stroke (HCC) 09/2019   Past Surgical History:  Procedure Laterality Date   SHOULDER SURGERY Left    lipoma   TOTAL KNEE ARTHROPLASTY Left 12/26/2022   Procedure: LEFT TOTAL KNEE ARTHROPLASTY;  Surgeon: Harden Jerona GAILS, MD;  Location: [facility] OR;  Service: Orthopedics;  Laterality: Left;   Patient Active Problem List   Diagnosis Date Noted   Total knee replacement status, 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   Rotator cuff syndrome, right 01/11/2020   Labile blood glucose    Vascular headache    Small vessel disease, cerebrovascular    Prediabetes    AKI (acute kidney injury) (HCC)    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   Acute cerebrovascular accident (CVA) (HCC) 09/30/2019   Unilateral primary osteoarthritis, left knee 03/17/2019   Unilateral primary osteoarthritis, right knee 03/17/2019   Bilateral primary osteoarthritis of knee 03/10/2019   INSOMNIA, CHRONIC 05/19/2007   SPONDYLOSIS, LUMBAR 05/19/2007   Essential hypertension 05/03/2007   ARTHRITIS, KNEE 05/03/2007    PCP: Bridgette Sluder, PA  REFERRING PROVIDER: Valdemar Rocky SAUNDERS, NP  REFERRING  DIAG:  [phone] (ICD-10-CM) - Total knee replacement status, left  D16.577J (ICD-10-CM) - Sprain of lateral collateral ligament of left knee, initial encounter    THERAPY DIAG:  Chronic pain of left knee  Muscle weakness (generalized)  Difficulty in walking, not elsewhere classified  Rationale for Evaluation and Treatment: Rehabilitation  ONSET DATE: 12/26/22 A long time  SUBJECTIVE:   SUBJECTIVE STATEMENT: Pt reports her knees bother her when walking and she also has low back pain. With a few months ago starting to bother her more Swelling has gone down  PERTINENT HISTORY: ***  PAIN:  Are you having pain? Yes: NPRS scale: 6/10; Pain range range prior to PT: 4-8/10 Pain location: L knee, both sides of the knee Pain description: ache, sharp Aggravating factors: Increased time on feey Relieving factors: Lidocaine  patches  PRECAUTIONS: {Therapy precautions:24002}  RED FLAGS: {PT Red Flags:29287}   WEIGHT BEARING RESTRICTIONS: {Yes ***/No:24003}  FALLS:  Has patient fallen in last 6 months? {fallsyesno:27318}  LIVING ENVIRONMENT: Lives with: lives alone Lives in: House/apartment Stairs: No Has following equipment at home: Single point cane and Environmental consultant - 4 wheeled  OCCUPATION: retired  PLOF: {PLOF:24004}  PATIENT GOALS: To walk better s rollator and with less pain  NEXT MD VISIT: ***  OBJECTIVE:  Note: Objective measures were completed at Evaluation unless otherwise noted.  DIAGNOSTIC FINDINGS:  03/02/24 X-ray L Knee Radiographs  of the left knee show stable alignment of total knee  arthroplasty hardware there is no complicating features.  There is  end-stage osteoarthritis present to the right knee.  With bone-on-bone  contact.   PATIENT SURVEYS:  LEFS    COGNITION: Overall cognitive status: {cognition:24006}     SENSATION: WFL  EDEMA:  Swelling present  MUSCLE LENGTH: Hamstrings: Right *** deg; Left *** deg Debby test: Right *** deg; Left ***  deg  POSTURE: {posture:25561}  PALPATION: TTP to the peri- L knee  LOWER EXTREMITY ROM:  Active ROM Right eval Left eval  Hip flexion    Hip extension    Hip abduction    Hip adduction    Hip internal rotation    Hip external rotation    Knee flexion    Knee extension    Ankle dorsiflexion    Ankle plantarflexion    Ankle inversion    Ankle eversion     (Blank rows = not tested)  LOWER EXTREMITY MMT:  MMT Right eval Left eval  Hip flexion    Hip extension    Hip abduction    Hip adduction    Hip internal rotation    Hip external rotation    Knee flexion    Knee extension    Ankle dorsiflexion    Ankle plantarflexion    Ankle inversion    Ankle eversion     (Blank rows = not tested)  LOWER EXTREMITY SPECIAL TESTS:  {LEspecialtests:26242}  FUNCTIONAL TESTS:  {Functional tests:24029}  GAIT: Distance walked: *** Assistive device utilized: {Assistive devices:23999} Level of assistance: {Levels of assistance:24026} Comments: ***                                                                                                                                TREATMENT DATE:  [facility] Adult PT Treatment:                                                DATE: 03/18/24 Therapeutic Exercise: *** Manual Therapy: *** Neuromuscular re-ed: *** Therapeutic Activity: *** Modalities: *** Self Care: ***     PATIENT EDUCATION:  Education details: *** Person educated: {Person educated:25204} Education method: {Education Method:25205} Education comprehension: {Education Comprehension:25206}  HOME EXERCISE PROGRAM: Access Code: [email or web address] URL: [email or web address] Date: 03/18/2024 Prepared by: Dasie Daft  Exercises - Supine Quadricep Sets  - 7 x weekly - 2 sets - 10 reps - 5 sec hold - Active Straight Leg Raise with Quad Set  - 7 x weekly - 2 sets - 10 reps - Supine Heel Slide with Strap  - 7 x weekly - 2 sets - 10 reps - 5 sec hold - Seated  Hamstring Stretch  - 7 x weekly - 2 reps - 30 sec hold - Seated Long Arc Quad  -  7 x weekly - 3 sets - 15 reps - Sit to Stand with Counter Support  - 7 x weekly - 2 sets - 5 reps - Side Stepping with Resistance at Ankles and Counter Support  - 7 x weekly - 3 reps - red band hold - Standing Hip Abduction with Resistance at Ankles and Counter Support  - 7 x weekly - 2 sets - 10 reps - red band hold  ASSESSMENT:  CLINICAL IMPRESSION: Patient is a 73 y.o. female who was seen today for physical therapy evaluation and treatment for  Z96.652 (ICD-10-CM) - Total knee replacement status, left  S83.422A (ICD-10-CM) - Sprain of lateral collateral ligament of left knee, initial encounter  .   OBJECTIVE IMPAIRMENTS: {opptimpairments:25111}.   ACTIVITY LIMITATIONS: {activitylimitations:27494}  PARTICIPATION LIMITATIONS: {participationrestrictions:25113}  PERSONAL FACTORS: {Personal factors:25162} are also affecting patient's functional outcome.   REHAB POTENTIAL: {rehabpotential:25112}  CLINICAL DECISION MAKING: {clinical decision making:25114}  EVALUATION COMPLEXITY: {Evaluation complexity:25115}   GOALS:  SHORT TERM GOALS: Target date: *** *** Baseline: Goal status: INITIAL  2.  *** Baseline:  Goal status: INITIAL  3.  *** Baseline:  Goal status: INITIAL  4.  *** Baseline:  Goal status: INITIAL  5.  *** Baseline:  Goal status: INITIAL  6.  *** Baseline:  Goal status: INITIAL  LONG TERM GOALS: Target date: ***  *** Baseline:  Goal status: INITIAL  2.  *** Baseline:  Goal status: INITIAL  3.  *** Baseline:  Goal status: INITIAL  4.  *** Baseline:  Goal status: INITIAL  5.  *** Baseline:  Goal status: INITIAL  6.  *** Baseline:  Goal status: INITIAL   PLAN:  PT FREQUENCY: {rehab frequency:25116}  PT DURATION: {rehab duration:25117}  PLANNED INTERVENTIONS: {rehab planned interventions:25118::97110-Therapeutic exercises,97530- Therapeutic  [phone]- Neuromuscular re-education,97535- Self Rjmz,02859- Manual therapy}  PLAN FOR NEXT SESSION: PIERRETTE Dasie Daft, PT 03/19/2024, 2:37 PM

## 2024-03-18 ENCOUNTER — Ambulatory Visit: Attending: Physician Assistant

## 2024-03-18 ENCOUNTER — Other Ambulatory Visit: Payer: Self-pay

## 2024-03-18 DIAGNOSIS — G8929 Other chronic pain: Secondary | ICD-10-CM | POA: Insufficient documentation

## 2024-03-18 DIAGNOSIS — M25562 Pain in left knee: Secondary | ICD-10-CM | POA: Diagnosis present

## 2024-03-18 DIAGNOSIS — R262 Difficulty in walking, not elsewhere classified: Secondary | ICD-10-CM | POA: Diagnosis present

## 2024-03-18 DIAGNOSIS — M6281 Muscle weakness (generalized): Secondary | ICD-10-CM | POA: Diagnosis present

## 2024-03-23 ENCOUNTER — Encounter: Payer: Self-pay | Admitting: Physical Therapy

## 2024-03-23 ENCOUNTER — Ambulatory Visit: Admitting: Physical Therapy

## 2024-03-23 DIAGNOSIS — M25562 Pain in left knee: Secondary | ICD-10-CM | POA: Diagnosis not present

## 2024-03-23 DIAGNOSIS — G8929 Other chronic pain: Secondary | ICD-10-CM

## 2024-03-23 DIAGNOSIS — M6281 Muscle weakness (generalized): Secondary | ICD-10-CM

## 2024-03-23 NOTE — Therapy (Signed)
 OUTPATIENT PHYSICAL THERAPY LOWER EXTREMITY TREATMENT   Patient Name: Alison Whitehead MRN: [id number] DOB:1951/03/23, 73 y.o., female Today's Date: 03/23/2024  END OF SESSION:  PT End of Session - 03/23/24 1101     Visit Number 2    Number of Visits 13    Date for PT Re-Evaluation 05/06/24    Authorization Type [employer] MEDICARE HMO/PPO    PT Start Time 1100    PT Stop Time 1145    PT Time Calculation (min) 45 min          Past Medical History:  Diagnosis Date   Allergy    seasonal   Arthritis    CKD (chronic kidney disease)    DDD (degenerative disc disease)    Degenerative disc disease, lumbar    Hypertension    IBS (irritable bowel syndrome)    Osteopenia    Osteoporosis    osteopenia   Stroke (HCC) 09/2019   Past Surgical History:  Procedure Laterality Date   SHOULDER SURGERY Left    lipoma   TOTAL KNEE ARTHROPLASTY Left 12/26/2022   Procedure: LEFT TOTAL KNEE ARTHROPLASTY;  Surgeon: Harden Jerona GAILS, MD;  Location: [facility] OR;  Service: Orthopedics;  Laterality: Left;   Patient Active Problem List   Diagnosis Date Noted   Total knee replacement status, 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   Rotator cuff syndrome, right 01/11/2020   Labile blood glucose    Vascular headache    Small vessel disease, cerebrovascular    Prediabetes    AKI (acute kidney injury) (HCC)    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   Acute cerebrovascular accident (CVA) (HCC) 09/30/2019   Unilateral primary osteoarthritis, left knee 03/17/2019   Unilateral primary osteoarthritis, right knee 03/17/2019   Bilateral primary osteoarthritis of knee 03/10/2019   INSOMNIA, CHRONIC 05/19/2007   SPONDYLOSIS, LUMBAR 05/19/2007   Essential hypertension 05/03/2007   ARTHRITIS, KNEE 05/03/2007    PCP: Bridgette Sluder, PA  REFERRING PROVIDER: Valdemar Rocky SAUNDERS, NP  REFERRING DIAG:  [phone] (ICD-10-CM) - Total knee replacement status, left  D16.577J (ICD-10-CM) - Sprain of lateral collateral ligament of left knee,  initial encounter    THERAPY DIAG:  Chronic pain of left knee  Muscle weakness (generalized)  Rationale for Evaluation and Treatment: Rehabilitation  ONSET DATE: 12/26/22 A long time  SUBJECTIVE:   SUBJECTIVE STATEMENT: Not using walker today.  Right knee and left knee 4/10. Back is better today.    EVAL: Pt reports a chronic Hx of knee pain L>R with a TKA of the L completed 5/24. She notes her knees bother her when walking with them starting to be more painful and swollen the past few months. She also endorses low back pain. She feels like thet are now less swollen.   PERTINENT HISTORY: See above. High BMI.  PAIN:  Are you having pain? Yes: NPRS scale: 4/10; Pain range range prior to PT: 4-8/10 Pain location: L knee, both sides of the knee Pain description: ache, sharp Aggravating factors: Increased time on feey Relieving factors: Lidocaine  patches  PRECAUTIONS: None  RED FLAGS: None   WEIGHT BEARING RESTRICTIONS: No  FALLS:  Has patient fallen in last 6 months? No  LIVING ENVIRONMENT: Lives with: lives alone Lives in: House/apartment Stairs: No Has following equipment at home: Single point cane and Environmental consultant - 4 wheeled  OCCUPATION: retired  PLOF: Independent with household mobility with device and Independent with community mobility with device  PATIENT GOALS: To walk better s rollator if possible and with less pain  NEXT MD VISIT: Not scheduled  OBJECTIVE:  Note: Objective measures were completed at Evaluation unless otherwise noted.  DIAGNOSTIC FINDINGS:  03/02/24 X-ray L Knee Radiographs of the left knee show stable alignment of total knee  arthroplasty hardware there is no complicating features.  There is  end-stage osteoarthritis present to the right knee.  With bone-on-bone  contact.   PATIENT SURVEYS:  LEFS: 31/80=39%   COGNITION: Overall cognitive status: Within functional limits for tasks assessed     SENSATION: WFL  EDEMA:  Swelling  present  MUSCLE LENGTH: Hamstrings: Right: Tight deg; Left tight deg Thomas test: Right Tight deg; Left Tight deg  POSTURE: increased lumbar lordosis and flexed trunk   PALPATION: TTP to the peri- L knee  LOWER EXTREMITY ROM:  Active ROM Right eval Left eval  Hip flexion    Hip extension    Hip abduction    Hip adduction    Hip internal rotation    Hip external rotation    Knee flexion 90 100  Knee extension 15 lacking 15 lacking  Ankle dorsiflexion    Ankle plantarflexion    Ankle inversion    Ankle eversion     (Blank rows = not tested)  LOWER EXTREMITY MMT:  MMT Right eval Left eval  Hip flexion 3 3  Hip extension 2 2  Hip abduction 3 3  Hip adduction    Hip internal rotation    Hip external rotation 3 3  Knee flexion 4 4  Knee extension 4 4  Ankle dorsiflexion    Ankle plantarflexion    Ankle inversion    Ankle eversion     (Blank rows = not tested)   FUNCTIONAL TESTS:  5 times sit to stand: 29.5  c use of hands 2 minute walk test: TBA  03/23/24: 193 feet with SPC, pt with increased SOB due to exertion  GAIT: Distance walked: 100' Assistive device utilized: Environmental consultant - 4 wheeled Level of assistance: Modified independence Comments: Decreased pace and step length                                                                                                                                TREATMENT DATE:  [facility] Adult PT Treatment:                                                DATE: 03/23/24 Therapeutic Exercise: SLR x 10 each  Bridge x 10  Supine clam GTB 10 x 2  QS into towel 10 x 2 each   Therapeutic Activity: 2 MWT 193 feet , Fatigued and SOB at end of walk  10 min L4 UE/LE Nustep for activity tolerance     [facility] Adult PT Treatment:  DATE: 03/18/24 Therapeutic Exercise: Developed, instructed in, and pt completed therex as noted in HEP  Self Care: Use od cold or hot packs as needed for pain and  swelling management     PATIENT EDUCATION:  Education details: Eval findings, POC, HEP, self care  Person educated: Patient Education method: Explanation, Demonstration, Tactile cues, Verbal cues, and Handouts Education comprehension: verbalized understanding, returned demonstration, verbal cues required, and tactile cues required  HOME EXERCISE PROGRAM: Access Code: [email or web address] URL: [email or web address] Date: 03/18/2024 Prepared by: Dasie Daft  Exercises - Supine Quadricep Sets  - 7 x weekly - 2 sets - 10 reps - 5 sec hold - Active Straight Leg Raise with Quad Set  - 7 x weekly - 2 sets - 10 reps - Supine Heel Slide with Strap  - 7 x weekly - 2 sets - 10 reps - 5 sec hold - Seated Hamstring Stretch  - 7 x weekly - 2 reps - 30 sec hold - Seated Long Arc Quad  - 7 x weekly - 3 sets - 15 reps - Sit to Stand with Counter Support  - 7 x weekly - 2 sets - 5 reps - Side Stepping with Resistance at Ankles and Counter Support  - 7 x weekly - 3 reps - red band hold - Standing Hip Abduction with Resistance at Ankles and Counter Support  - 7 x weekly - 2 sets - 10 reps - red band hold  ASSESSMENT:  CLINICAL IMPRESSION: Patient is a 73 y.o. female who was seen today for physical therapy evaluation and treatment for  Z96.652 (ICD-10-CM) - Total knee replacement status, left  S83.422A (ICD-10-CM) - Sprain of lateral collateral ligament of left knee, initial encounter   Pt presents with decreased LE strength, AROM, and chronic knee pain bilat L>R which is negatively impacting the pt's quality and tolerance for functional mobility. Pt will benefit from skilled PT 2w6 to address impairments to optimize leg function  and mobility with less pain.   OBJECTIVE IMPAIRMENTS: decreased activity tolerance, decreased balance, decreased endurance, decreased mobility, difficulty walking, decreased ROM, decreased strength, and increased edema, and pain.  PERSONAL FACTORS: Age, Fitness,  Past/current experiences, Time since onset of injury/illness/exacerbation, and 1-2 comorbidities: high BMI, trunk postural changes are also affecting patient's functional outcome.   REHAB POTENTIAL: Good  CLINICAL DECISION MAKING: Evolving/moderate complexity  EVALUATION COMPLEXITY: Moderate   GOALS:  SHORT TERM GOALS: Target date: 04/08/24 Pt will be Ind in an initial HEP  Baseline: started Goal status: INITIAL  LONG TERM GOALS: Target date: 05/06/24  Pt will be Ind in a final HEP to maintain achieved LOF  Baseline:  Goal status: INITIAL  2.  Increase pt's bilat knee and hip strengths by 1/2 muscle grade to improve pt's function with daily activities Baseline:  Goal status: INITIAL  3.  Increased pt's bilat knee flexion to 105d or greater to improve pt's to obtain sitting positions Baseline: L 100d, R 90d Goal status: INITIAL  4.  Improve 5xSTS by MCID of 5 and by MCID of 26ft as indication of improved functional mobility  Baseline: 5xSTS= 29.5, 193 with SPC Goal status: INITIAL  5.  Pt will report 50% or greater improvement in her L knee pain for improved function and QOL Baseline: 4-8/10 Goal status: INITIAL  6.  Pt's LEFS score will improve by the the MCID to 49% or greater as indication of improved function  Baseline: 39% Goal status: INITIAL    PLAN:  PT FREQUENCY:  2x/week  PT DURATION: 6 weeks  PLANNED INTERVENTIONS: 97164- PT Re-evaluation, 97110-Therapeutic exercises, 97530- Therapeutic activity, [id number]- Neuromuscular re-education, 97535- Self Care, 02859- Manual therapy, [phone]- Gait training, [phone]- Vasopneumatic device, Patient/Family education, Balance training, Stair training, Taping, Joint mobilization, Cryotherapy, and Moist heat  PLAN FOR NEXT SESSION: assess response to HEP; progress therex as indicated; use of modalities, and manual therapy.   Harlene Persons, PTA 03/23/24 1:06 PM Phone: [phone] Fax: [phone]

## 2024-03-25 ENCOUNTER — Encounter: Payer: Self-pay | Admitting: Physical Therapy

## 2024-03-25 ENCOUNTER — Ambulatory Visit: Admitting: Physical Therapy

## 2024-03-25 DIAGNOSIS — G8929 Other chronic pain: Secondary | ICD-10-CM

## 2024-03-25 DIAGNOSIS — M6281 Muscle weakness (generalized): Secondary | ICD-10-CM

## 2024-03-25 DIAGNOSIS — M25562 Pain in left knee: Secondary | ICD-10-CM | POA: Diagnosis not present

## 2024-03-25 NOTE — Therapy (Signed)
 OUTPATIENT PHYSICAL THERAPY LOWER EXTREMITY TREATMENT   Patient Name: Alison Whitehead MRN: [id number] DOB:07-19-1951, 73 y.o., female Today's Date: 03/25/2024  END OF SESSION:  PT End of Session - 03/25/24 1058     Visit Number 3    Number of Visits 13    Date for PT Re-Evaluation 05/06/24    Authorization Type [employer] MEDICARE HMO/PPO    PT Start Time 1100    PT Stop Time 1145    PT Time Calculation (min) 45 min          Past Medical History:  Diagnosis Date   Allergy    seasonal   Arthritis    CKD (chronic kidney disease)    DDD (degenerative disc disease)    Degenerative disc disease, lumbar    Hypertension    IBS (irritable bowel syndrome)    Osteopenia    Osteoporosis    osteopenia   Stroke (HCC) 09/2019   Past Surgical History:  Procedure Laterality Date   SHOULDER SURGERY Left    lipoma   TOTAL KNEE ARTHROPLASTY Left 12/26/2022   Procedure: LEFT TOTAL KNEE ARTHROPLASTY;  Surgeon: Harden Jerona GAILS, MD;  Location: [facility] OR;  Service: Orthopedics;  Laterality: Left;   Patient Active Problem List   Diagnosis Date Noted   Total knee replacement status, 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   Rotator cuff syndrome, right 01/11/2020   Labile blood glucose    Vascular headache    Small vessel disease, cerebrovascular    Prediabetes    AKI (acute kidney injury) (HCC)    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   Acute cerebrovascular accident (CVA) (HCC) 09/30/2019   Unilateral primary osteoarthritis, left knee 03/17/2019   Unilateral primary osteoarthritis, right knee 03/17/2019   Bilateral primary osteoarthritis of knee 03/10/2019   INSOMNIA, CHRONIC 05/19/2007   SPONDYLOSIS, LUMBAR 05/19/2007   Essential hypertension 05/03/2007   ARTHRITIS, KNEE 05/03/2007    PCP: Bridgette Sluder, PA  REFERRING PROVIDER: Valdemar Rocky SAUNDERS, NP  REFERRING DIAG:  [phone] (ICD-10-CM) - Total knee replacement status, left  D16.577J (ICD-10-CM) - Sprain of lateral collateral ligament of left knee,  initial encounter    THERAPY DIAG:  Chronic pain of left knee  Muscle weakness (generalized)  Rationale for Evaluation and Treatment: Rehabilitation  ONSET DATE: 12/26/22 A long time  SUBJECTIVE:   SUBJECTIVE STATEMENT: Not using walker today, but it wears me out. 8/10 back of the right knee and back.    EVAL: Pt reports a chronic Hx of knee pain L>R with a TKA of the L completed 5/24. She notes her knees bother her when walking with them starting to be more painful and swollen the past few months. She also endorses low back pain. She feels like thet are now less swollen.   PERTINENT HISTORY: See above. High BMI.  PAIN:  Are you having pain? Yes: NPRS scale: 4/10; Pain range range prior to PT: 4-8/10 Pain location: L knee, both sides of the knee Pain description: ache, sharp Aggravating factors: Increased time on feey Relieving factors: Lidocaine  patches  PRECAUTIONS: None  RED FLAGS: None   WEIGHT BEARING RESTRICTIONS: No  FALLS:  Has patient fallen in last 6 months? No  LIVING ENVIRONMENT: Lives with: lives alone Lives in: House/apartment Stairs: No Has following equipment at home: Single point cane and Environmental consultant - 4 wheeled  OCCUPATION: retired  PLOF: Independent with household mobility with device and Independent with community mobility with device  PATIENT GOALS: To walk better s rollator if possible and with  less pain  NEXT MD VISIT: Not scheduled  OBJECTIVE:  Note: Objective measures were completed at Evaluation unless otherwise noted.  DIAGNOSTIC FINDINGS:  03/02/24 X-ray L Knee Radiographs of the left knee show stable alignment of total knee  arthroplasty hardware there is no complicating features.  There is  end-stage osteoarthritis present to the right knee.  With bone-on-bone  contact.   PATIENT SURVEYS:  LEFS: 31/80=39%   COGNITION: Overall cognitive status: Within functional limits for tasks assessed     SENSATION: WFL  EDEMA:   Swelling present  MUSCLE LENGTH: Hamstrings: Right: Tight deg; Left tight deg Thomas test: Right Tight deg; Left Tight deg  POSTURE: increased lumbar lordosis and flexed trunk   PALPATION: TTP to the peri- L knee  LOWER EXTREMITY ROM:  Active ROM Right eval Left eval  Hip flexion    Hip extension    Hip abduction    Hip adduction    Hip internal rotation    Hip external rotation    Knee flexion 90 100  Knee extension 15 lacking 15 lacking  Ankle dorsiflexion    Ankle plantarflexion    Ankle inversion    Ankle eversion     (Blank rows = not tested)  LOWER EXTREMITY MMT:  MMT Right eval Left eval  Hip flexion 3 3  Hip extension 2 2  Hip abduction 3 3  Hip adduction    Hip internal rotation    Hip external rotation 3 3  Knee flexion 4 4  Knee extension 4 4  Ankle dorsiflexion    Ankle plantarflexion    Ankle inversion    Ankle eversion     (Blank rows = not tested)   FUNCTIONAL TESTS:  5 times sit to stand: 29.5  c use of hands 2 minute walk test: TBA  03/23/24: 193 feet with SPC, pt with increased SOB due to exertion  GAIT: Distance walked: 100' Assistive device utilized: Environmental consultant - 4 wheeled Level of assistance: Modified independence Comments: Decreased pace and step length                                                                                                                                TREATMENT DATE:  [facility] Adult PT Treatment:                                                DATE: 03/25/24 Therapeutic Exercise: Alternating LAQs  Seated heel slides on cloth x 20 each Bridge x 10  Supine h/s stretch each with strap -starting on high bolster SAQ high bolster x 20   Therapeutic Activity: Gait 174 feet with SPC, cues for right knee flexion 10 min L5 UE/LE Nustep for activity tolerance  STS 5 x 2   Self Care: Walking Program 2 min, 3 times  per day     [facility] Adult PT Treatment:                                                DATE:  03/23/24 Therapeutic Exercise: SLR x 10 each  Bridge x 10  Supine clam GTB 10 x 2  QS into towel 10 x 2 each   Therapeutic Activity: 2 MWT 193 feet , Fatigued and SOB at end of walk  10 min L4 UE/LE Nustep for activity tolerance     [facility] Adult PT Treatment:                                                DATE: 03/18/24 Therapeutic Exercise: Developed, instructed in, and pt completed therex as noted in HEP  Self Care: Use od cold or hot packs as needed for pain and swelling management     PATIENT EDUCATION:  Education details: Eval findings, POC, HEP, self care  Person educated: Patient Education method: Explanation, Demonstration, Tactile cues, Verbal cues, and Handouts Education comprehension: verbalized understanding, returned demonstration, verbal cues required, and tactile cues required  HOME EXERCISE PROGRAM: Access Code: [email or web address] URL: [email or web address] Date: 03/18/2024 Prepared by: Dasie Daft  Exercises - Supine Quadricep Sets  - 7 x weekly - 2 sets - 10 reps - 5 sec hold - Active Straight Leg Raise with Quad Set  - 7 x weekly - 2 sets - 10 reps - Supine Heel Slide with Strap  - 7 x weekly - 2 sets - 10 reps - 5 sec hold - Seated Hamstring Stretch  - 7 x weekly - 2 reps - 30 sec hold - Seated Long Arc Quad  - 7 x weekly - 3 sets - 15 reps - Sit to Stand with Counter Support  - 7 x weekly - 2 sets - 5 reps - Side Stepping with Resistance at Ankles and Counter Support  - 7 x weekly - 3 reps - red band hold - Standing Hip Abduction with Resistance at Ankles and Counter Support  - 7 x weekly - 2 sets - 10 reps - red band hold  ASSESSMENT:  CLINICAL IMPRESSION: Continued to work on activity tolerance with gait and aerobic machine for 12 minutes total activity. She continues to fatigue quickly, walking with SPC. Left knee is improved however her right knee is limiting her significantly due to pain and also suffering from high levels of back pain.     EVAL: Patient is a 73 y.o. female who was seen today for physical therapy evaluation and treatment for  Z96.652 (ICD-10-CM) - Total knee replacement status, left  S83.422A (ICD-10-CM) - Sprain of lateral collateral ligament of left knee, initial encounter   Pt presents with decreased LE strength, AROM, and chronic knee pain bilat L>R which is negatively impacting the pt's quality and tolerance for functional mobility. Pt will benefit from skilled PT 2w6 to address impairments to optimize leg function  and mobility with less pain.   OBJECTIVE IMPAIRMENTS: decreased activity tolerance, decreased balance, decreased endurance, decreased mobility, difficulty walking, decreased ROM, decreased strength, and increased edema, and pain.  PERSONAL FACTORS: Age, Fitness, Past/current experiences, Time since onset of injury/illness/exacerbation, and 1-2 comorbidities: high BMI, trunk postural  changes are also affecting patient's functional outcome.   REHAB POTENTIAL: Good  CLINICAL DECISION MAKING: Evolving/moderate complexity  EVALUATION COMPLEXITY: Moderate   GOALS:  SHORT TERM GOALS: Target date: 04/08/24 Pt will be Ind in an initial HEP  Baseline: started Goal status: INITIAL  LONG TERM GOALS: Target date: 05/06/24  Pt will be Ind in a final HEP to maintain achieved  LOF  Baseline:  Goal status: INITIAL  2.  Increase pt's bilat knee and hip strengths by 1/2 muscle grade to improve pt's function with daily activities Baseline:  Goal status: INITIAL  3.  Increased pt's bilat knee flexion to 105d or greater to improve pt's to obtain sitting positions Baseline: L 100d, R 90d Goal status: INITIAL  4.  Improve 5xSTS by MCID of 5 and by MCID of 68ft as indication of improved functional mobility  Baseline: 5xSTS= 29.5, 193 with SPC Goal status: INITIAL  5.  Pt will report 50% or greater improvement in her L knee pain for improved function and QOL Baseline: 4-8/10 Goal status:  INITIAL  6.  Pt's LEFS score will improve by the the MCID to 49% or greater as indication of improved function  Baseline: 39% Goal status: INITIAL    PLAN:  PT FREQUENCY: 2x/week  PT DURATION: 6 weeks  PLANNED INTERVENTIONS: 97164- PT Re-evaluation, 97110-Therapeutic exercises, 97530- Therapeutic activity, 97112- Neuromuscular re-education, 97535- Self Care, 02859- Manual therapy, 97116- Gait training, [phone]- Vasopneumatic device, Patient/Family education, Balance training, Stair training, Taping, Joint mobilization, Cryotherapy, and Moist heat  PLAN FOR NEXT SESSION: assess response to HEP; progress therex as indicated; use of modalities, and manual therapy.   Harlene Persons, PTA 03/25/24 12:44 PM Phone: [phone] Fax: [phone]

## 2024-03-29 ENCOUNTER — Encounter: Payer: Self-pay | Admitting: Physical Therapy

## 2024-03-29 ENCOUNTER — Ambulatory Visit: Admitting: Physical Therapy

## 2024-03-29 DIAGNOSIS — R262 Difficulty in walking, not elsewhere classified: Secondary | ICD-10-CM

## 2024-03-29 DIAGNOSIS — G8929 Other chronic pain: Secondary | ICD-10-CM

## 2024-03-29 DIAGNOSIS — M6281 Muscle weakness (generalized): Secondary | ICD-10-CM

## 2024-03-29 DIAGNOSIS — M25562 Pain in left knee: Secondary | ICD-10-CM | POA: Diagnosis not present

## 2024-03-29 NOTE — Therapy (Signed)
 OUTPATIENT PHYSICAL THERAPY LOWER EXTREMITY TREATMENT   Patient Name: Alison Whitehead MRN: [id number] DOB:February 15, 1951, 73 y.o., female Today's Date: 03/29/2024  END OF SESSION:  PT End of Session - 03/29/24 1100     Visit Number 4    Number of Visits 13    Date for PT Re-Evaluation 05/06/24    Authorization Type [employer] MEDICARE HMO/PPO    PT Start Time 1100    PT Stop Time 1145    PT Time Calculation (min) 45 min          Past Medical History:  Diagnosis Date   Allergy    seasonal   Arthritis    CKD (chronic kidney disease)    DDD (degenerative disc disease)    Degenerative disc disease, lumbar    Hypertension    IBS (irritable bowel syndrome)    Osteopenia    Osteoporosis    osteopenia   Stroke (HCC) 09/2019   Past Surgical History:  Procedure Laterality Date   SHOULDER SURGERY Left    lipoma   TOTAL KNEE ARTHROPLASTY Left 12/26/2022   Procedure: LEFT TOTAL KNEE ARTHROPLASTY;  Surgeon: Harden Jerona GAILS, MD;  Location: [facility] OR;  Service: Orthopedics;  Laterality: Left;   Patient Active Problem List   Diagnosis Date Noted   Total knee replacement status, 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   Rotator cuff syndrome, right 01/11/2020   Labile blood glucose    Vascular headache    Small vessel disease, cerebrovascular    Prediabetes    AKI (acute kidney injury) (HCC)    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   Acute cerebrovascular accident (CVA) (HCC) 09/30/2019   Unilateral primary osteoarthritis, left knee 03/17/2019   Unilateral primary osteoarthritis, right knee 03/17/2019   Bilateral primary osteoarthritis of knee 03/10/2019   INSOMNIA, CHRONIC 05/19/2007   SPONDYLOSIS, LUMBAR 05/19/2007   Essential hypertension 05/03/2007   ARTHRITIS, KNEE 05/03/2007    PCP: Bridgette Sluder, PA  REFERRING PROVIDER: Valdemar Rocky SAUNDERS, NP  REFERRING DIAG:  [phone] (ICD-10-CM) - Total knee replacement status, left  D16.577J (ICD-10-CM) - Sprain of lateral collateral ligament of left knee,  initial encounter    THERAPY DIAG:  Chronic pain of left knee  Muscle weakness (generalized)  Difficulty in walking, not elsewhere classified  Rationale for Evaluation and Treatment: Rehabilitation  ONSET DATE: 12/26/22 A long time  SUBJECTIVE:   SUBJECTIVE STATEMENT: I must have pulled something on my right side (low back). I almost brought my walker. I do not know what I did.    EVAL: Pt reports a chronic Hx of knee pain L>R with a TKA of the L completed 5/24. She notes her knees bother her when walking with them starting to be more painful and swollen the past few months. She also endorses low back pain. She feels like thet are now less swollen.   PERTINENT HISTORY: See above. High BMI.  PAIN:  Are you having pain? Yes: NPRS scale: 4/10   (8/10 right knee, 8/10 left side starting Monday morning)  Pain location: L knee, both sides of the knee Pain description: ache, sharp Aggravating factors: Increased time on feey Relieving factors: Lidocaine  patches  PRECAUTIONS: None  RED FLAGS: None   WEIGHT BEARING RESTRICTIONS: No  FALLS:  Has patient fallen in last 6 months? No  LIVING ENVIRONMENT: Lives with: lives alone Lives in: House/apartment Stairs: No Has following equipment at home: Single point cane and Environmental consultant - 4 wheeled  OCCUPATION: retired  PLOF: Independent with household mobility with device and  Independent with community mobility with device  PATIENT GOALS: To walk better s rollator if possible and with less pain  NEXT MD VISIT: Not scheduled  OBJECTIVE:  Note: Objective measures were completed at Evaluation unless otherwise noted.  DIAGNOSTIC FINDINGS:  03/02/24 X-ray L Knee Radiographs of the left knee show stable alignment of total knee  arthroplasty hardware there is no complicating features.  There is  end-stage osteoarthritis present to the right knee.  With bone-on-bone  contact.   PATIENT SURVEYS:  LEFS:  31/80=39%   COGNITION: Overall cognitive status: Within functional limits for tasks assessed     SENSATION: WFL  EDEMA:  Swelling present  MUSCLE LENGTH: Hamstrings: Right: Tight deg; Left tight deg Thomas test: Right Tight deg; Left Tight deg  POSTURE: increased lumbar lordosis and flexed trunk   PALPATION: TTP to the peri- L knee  LOWER EXTREMITY ROM:  Active ROM Right eval Left eval  Hip flexion    Hip extension    Hip abduction    Hip adduction    Hip internal rotation    Hip external rotation    Knee flexion 90 100  Knee extension 15 lacking 15 lacking  Ankle dorsiflexion    Ankle plantarflexion    Ankle inversion    Ankle eversion     (Blank rows = not tested)  LOWER EXTREMITY MMT:  MMT Right eval Left eval  Hip flexion 3 3  Hip extension 2 2  Hip abduction 3 3  Hip adduction    Hip internal rotation    Hip external rotation 3 3  Knee flexion 4 4  Knee extension 4 4  Ankle dorsiflexion    Ankle plantarflexion    Ankle inversion    Ankle eversion     (Blank rows = not tested)   FUNCTIONAL TESTS:  5 times sit to stand: 29.5  c use of hands 2 minute walk test: TBA  03/23/24: 193 feet with SPC, pt with increased SOB due to exertion  GAIT: Distance walked: 100' Assistive device utilized: Environmental consultant - 4 wheeled Level of assistance: Modified independence Comments: Decreased pace and step length                                                                                                                                TREATMENT DATE:  [facility] Adult PT Treatment:                                                DATE: 03/29/24 Therapeutic Exercise: HL clam BTB HL ball squeeze Mini bridge  SAQ high bolster -difficulty lifting right Supine marching LTR Manual Therapy: KT tape for comfort with walking right knee -assess response next visit  Therapeutic Activity: Gait limited today due to acute left sided pain  Nustep L3 x 7  minutes UE/LE       [facility] Adult PT Treatment:                                                DATE: 03/25/24 Therapeutic Exercise: Alternating LAQs  Seated heel slides on cloth x 20 each Bridge x 10  Supine h/s stretch each with strap -starting on high bolster SAQ high bolster x 20   Therapeutic Activity: Gait 174 feet with SPC, cues for right knee flexion 10 min L5 UE/LE Nustep for activity tolerance  STS 5 x 2   Self Care: Walking Program 2 min, 3 times per day     [facility] Adult PT Treatment:                                                DATE: 03/23/24 Therapeutic Exercise: SLR x 10 each  Bridge x 10  Supine clam GTB 10 x 2  QS into towel 10 x 2 each   Therapeutic Activity: 2 MWT 193 feet , Fatigued and SOB at end of walk  10 min L4 UE/LE Nustep for activity tolerance     [facility] Adult PT Treatment:                                                DATE: 03/18/24 Therapeutic Exercise: Developed, instructed in, and pt completed therex as noted in HEP  Self Care: Use od cold or hot packs as needed for pain and swelling management     PATIENT EDUCATION:  Education details: Eval findings, POC, HEP, self care  Person educated: Patient Education method: Explanation, Demonstration, Tactile cues, Verbal cues, and Handouts Education comprehension: verbalized understanding, returned demonstration, verbal cues required, and tactile cues required  HOME EXERCISE PROGRAM: Access Code: [email or web address] URL: [email or web address] Date: 03/18/2024 Prepared by: Dasie Daft  Exercises - Supine Quadricep Sets  - 7 x weekly - 2 sets - 10 reps - 5 sec hold - Active Straight Leg Raise with Quad Set  - 7 x weekly - 2 sets - 10 reps - Supine Heel Slide with Strap  - 7 x weekly - 2 sets - 10 reps - 5 sec hold - Seated Hamstring Stretch  - 7 x weekly - 2 reps - 30 sec hold - Seated Long Arc Quad  - 7 x weekly - 3 sets - 15 reps - Sit to Stand with Counter Support  - 7 x weekly - 2 sets - 5 reps - Side  Stepping with Resistance at Ankles and Counter Support  - 7 x weekly - 3 reps - red band hold - Standing Hip Abduction with Resistance at Ankles and Counter Support  - 7 x weekly - 2 sets - 10 reps - red band hold  ASSESSMENT:  CLINICAL IMPRESSION: Pt reports acute right sided low back pain, points to QL area. She did complete walking program over the weekend 3 x per day for 2 minutes. She is unsure what caused the increased pain, woke Monday morning with the pain. Low tolerance to therex today due to right knee pain and back  pain. Applied KT tape to right knee to assess benefit, will f/u with it's effectiveness at next visit.     EVAL: Patient is a 72 y.o. female who was seen today for physical therapy evaluation and treatment for  Z96.652 (ICD-10-CM) - Total knee replacement status, left  S83.422A (ICD-10-CM) - Sprain of lateral collateral ligament of left knee, initial encounter   Pt presents with decreased LE strength, AROM, and chronic knee pain bilat L>R which is negatively impacting the pt's quality and tolerance for functional mobility. Pt will benefit from skilled PT 2w6 to address impairments to optimize leg function  and mobility with less pain.   OBJECTIVE IMPAIRMENTS: decreased activity tolerance, decreased balance, decreased endurance, decreased mobility, difficulty walking, decreased ROM, decreased strength, and increased edema, and pain.  PERSONAL FACTORS: Age, Fitness, Past/current experiences, Time since onset of injury/illness/exacerbation, and 1-2 comorbidities: high BMI, trunk postural changes are also affecting patient's functional outcome.   REHAB POTENTIAL: Good  CLINICAL DECISION MAKING: Evolving/moderate complexity  EVALUATION COMPLEXITY: Moderate   GOALS:  SHORT TERM GOALS: Target date: 04/08/24 Pt will be Ind in an initial HEP  Baseline: started Goal status: INITIAL  LONG TERM GOALS: Target date: 05/06/24  Pt will be Ind in a final HEP to maintain achieved   LOF  Baseline:  Goal status: INITIAL  2.  Increase pt's bilat knee and hip strengths by 1/2 muscle grade to improve pt's function with daily activities Baseline:  Goal status: INITIAL  3.  Increased pt's bilat knee flexion to 105d or greater to improve pt's to obtain sitting positions Baseline: L 100d, R 90d Goal status: INITIAL  4.  Improve 5xSTS by MCID of 5 and by MCID of 49ft as indication of improved functional mobility  Baseline: 5xSTS= 29.5, 193 with SPC Goal status: INITIAL  5.  Pt will report 50% or greater improvement in her L knee pain for improved function and QOL Baseline: 4-8/10 Goal status: INITIAL  6.  Pt's LEFS score will improve by the the MCID to 49% or greater as indication of improved function  Baseline: 39% Goal status: INITIAL    PLAN:  PT FREQUENCY: 2x/week  PT DURATION: 6 weeks  PLANNED INTERVENTIONS: 97164- PT Re-evaluation, 97110-Therapeutic exercises, 97530- Therapeutic activity, 97112- Neuromuscular re-education, 97535- Self Care, 02859- Manual therapy, 97116- Gait training, [phone]- Vasopneumatic device, Patient/Family education, Balance training, Stair training, Taping, Joint mobilization, Cryotherapy, and Moist heat  PLAN FOR NEXT SESSION: assess response to HEP; progress therex as indicated; use of modalities, and manual therapy.   Harlene Persons, PTA 03/29/24 12:54 PM Phone: [phone] Fax: [phone]

## 2024-03-31 ENCOUNTER — Ambulatory Visit: Admitting: Physical Therapy

## 2024-03-31 ENCOUNTER — Encounter: Payer: Self-pay | Admitting: Physical Therapy

## 2024-03-31 DIAGNOSIS — M6281 Muscle weakness (generalized): Secondary | ICD-10-CM

## 2024-03-31 DIAGNOSIS — G8929 Other chronic pain: Secondary | ICD-10-CM

## 2024-03-31 DIAGNOSIS — M25562 Pain in left knee: Secondary | ICD-10-CM | POA: Diagnosis not present

## 2024-03-31 NOTE — Therapy (Signed)
 OUTPATIENT PHYSICAL THERAPY LOWER EXTREMITY TREATMENT   Patient Name: Alison Whitehead MRN: [id number] DOB:1951-05-13, 73 y.o., female Today's Date: 03/31/2024  END OF SESSION:  PT End of Session - 03/31/24 1129     Visit Number 5    Number of Visits 13    Date for PT Re-Evaluation 05/06/24    Authorization Type [employer] MEDICARE HMO/PPO    PT Start Time 1100    PT Stop Time 1140    PT Time Calculation (min) 40 min           Past Medical History:  Diagnosis Date   Allergy    seasonal   Arthritis    CKD (chronic kidney disease)    DDD (degenerative disc disease)    Degenerative disc disease, lumbar    Hypertension    IBS (irritable bowel syndrome)    Osteopenia    Osteoporosis    osteopenia   Stroke (HCC) 09/2019   Past Surgical History:  Procedure Laterality Date   SHOULDER SURGERY Left    lipoma   TOTAL KNEE ARTHROPLASTY Left 12/26/2022   Procedure: LEFT TOTAL KNEE ARTHROPLASTY;  Surgeon: Harden Jerona GAILS, MD;  Location: [facility] OR;  Service: Orthopedics;  Laterality: Left;   Patient Active Problem List   Diagnosis Date Noted   Total knee replacement status, 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   Rotator cuff syndrome, right 01/11/2020   Labile blood glucose    Vascular headache    Small vessel disease, cerebrovascular    Prediabetes    AKI (acute kidney injury) (HCC)    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   Acute cerebrovascular accident (CVA) (HCC) 09/30/2019   Unilateral primary osteoarthritis, left knee 03/17/2019   Unilateral primary osteoarthritis, right knee 03/17/2019   Bilateral primary osteoarthritis of knee 03/10/2019   INSOMNIA, CHRONIC 05/19/2007   SPONDYLOSIS, LUMBAR 05/19/2007   Essential hypertension 05/03/2007   ARTHRITIS, KNEE 05/03/2007    PCP: Bridgette Sluder, PA  REFERRING PROVIDER: Valdemar Rocky SAUNDERS, NP  REFERRING DIAG:  [phone] (ICD-10-CM) - Total knee replacement status, left  D16.577J (ICD-10-CM) - Sprain of lateral collateral ligament of left knee,  initial encounter    THERAPY DIAG:  Chronic pain of left knee  Muscle weakness (generalized)  Rationale for Evaluation and Treatment: Rehabilitation  ONSET DATE: 12/26/22 A long time  SUBJECTIVE:   SUBJECTIVE STATEMENT: I got the kink out of my back. The tape really helped my right knee.     EVAL: Pt reports a chronic Hx of knee pain L>R with a TKA of the L completed 5/24. She notes her knees bother her when walking with them starting to be more painful and swollen the past few months. She also endorses low back pain. She feels like thet are now less swollen.   PERTINENT HISTORY: See above. High BMI.  PAIN:  Are you having pain? Yes: NPRS scale: 4/10   (8/10 right knee, 8/10 left side starting Monday morning)  Pain location: L knee, both sides of the knee Pain description: ache, sharp Aggravating factors: Increased time on feey Relieving factors: Lidocaine  patches  PRECAUTIONS: None  RED FLAGS: None   WEIGHT BEARING RESTRICTIONS: No  FALLS:  Has patient fallen in last 6 months? No  LIVING ENVIRONMENT: Lives with: lives alone Lives in: House/apartment Stairs: No Has following equipment at home: Single point cane and Environmental consultant - 4 wheeled  OCCUPATION: retired  PLOF: Independent with household mobility with device and Independent with community mobility with device  PATIENT GOALS: To walk better s  rollator if possible and with less pain  NEXT MD VISIT: Not scheduled  OBJECTIVE:  Note: Objective measures were completed at Evaluation unless otherwise noted.  DIAGNOSTIC FINDINGS:  03/02/24 X-ray L Knee Radiographs of the left knee show stable alignment of total knee  arthroplasty hardware there is no complicating features.  There is  end-stage osteoarthritis present to the right knee.  With bone-on-bone  contact.   PATIENT SURVEYS:  LEFS: 31/80=39%   COGNITION: Overall cognitive status: Within functional limits for tasks  assessed     SENSATION: WFL  EDEMA:  Swelling present  MUSCLE LENGTH: Hamstrings: Right: Tight deg; Left tight deg Thomas test: Right Tight deg; Left Tight deg  POSTURE: increased lumbar lordosis and flexed trunk   PALPATION: TTP to the peri- L knee  LOWER EXTREMITY ROM:  Active ROM Right eval Left eval Right 03/31/24 Left 03/31/24  Hip flexion      Hip extension      Hip abduction      Hip adduction      Hip internal rotation      Hip external rotation      Knee flexion 90 100 110 110  Knee extension 15 lacking 15 lacking    Ankle dorsiflexion      Ankle plantarflexion      Ankle inversion      Ankle eversion       (Blank rows = not tested)  LOWER EXTREMITY MMT:  MMT Right eval Left eval  Hip flexion 3 3  Hip extension 2 2  Hip abduction 3 3  Hip adduction    Hip internal rotation    Hip external rotation 3 3  Knee flexion 4 4  Knee extension 4 4  Ankle dorsiflexion    Ankle plantarflexion    Ankle inversion    Ankle eversion     (Blank rows = not tested)   FUNCTIONAL TESTS:  5 times sit to stand: 29.5  c use of hands 2 minute walk test: TBA  03/23/24: 193 feet with SPC, pt with increased SOB due to exertion  GAIT: Distance walked: 100' Assistive device utilized: Environmental consultant - 4 wheeled Level of assistance: Modified independence Comments: Decreased pace and step length                                                                                                                                TREATMENT DATE:  [facility] Adult PT Treatment:                                                DATE: 03/31/24 Therapeutic Exercise: LAQ 10 x 2 each  SLR x 10 each 1 HL ball squeeze 10 x 2  SL clam 10 x 2  Hip abdct x 10 each  Mini Bridge 10 x  2   Therapeutic Activity: Gait 185 feet in 2 minutes with SPC, SPO2 93%  Nustep L4 UE/LE x 10 minutes  STS x 5      [facility] Adult PT Treatment:                                                DATE:  03/29/24 Therapeutic Exercise: HL clam BTB HL ball squeeze Mini bridge  SAQ high bolster -difficulty lifting right Supine marching LTR Manual Therapy: KT tape for comfort with walking right knee -assess response next visit  Therapeutic Activity: Gait limited today due to acute left sided pain  Nustep L3 x 7 minutes UE/LE      [facility] Adult PT Treatment:                                                DATE: 03/25/24 Therapeutic Exercise: Alternating LAQs  Seated heel slides on cloth x 20 each Bridge x 10  Supine h/s stretch each with strap -starting on high bolster SAQ high bolster x 20   Therapeutic Activity: Gait 174 feet with SPC, cues for right knee flexion 10 min L5 UE/LE Nustep for activity tolerance  STS 5 x 2   Self Care: Walking Program 2 min, 3 times per day     [facility] Adult PT Treatment:                                                DATE: 03/23/24 Therapeutic Exercise: SLR x 10 each  Bridge x 10  Supine clam GTB 10 x 2  QS into towel 10 x 2 each   Therapeutic Activity: 2 MWT 193 feet , Fatigued and SOB at end of walk  10 min L4 UE/LE Nustep for activity tolerance        PATIENT EDUCATION:  Education details: Eval findings, POC, HEP, self care  Person educated: Patient Education method: Explanation, Demonstration, Tactile cues, Verbal cues, and Handouts Education comprehension: verbalized understanding, returned demonstration, verbal cues required, and tactile cues required  HOME EXERCISE PROGRAM: Access Code: [email or web address] URL: [email or web address] Date: 03/18/2024 Prepared by: Dasie Daft  Exercises - Supine Quadricep Sets  - 7 x weekly - 2 sets - 10 reps - 5 sec hold - Active Straight Leg Raise with Quad Set  - 7 x weekly - 2 sets - 10 reps - Supine Heel Slide with Strap  - 7 x weekly - 2 sets - 10 reps - 5 sec hold - Seated Hamstring Stretch  - 7 x weekly - 2 reps - 30 sec hold - Seated Long Arc Quad  - 7 x weekly - 3 sets - 15  reps - Sit to Stand with Counter Support  - 7 x weekly - 2 sets - 5 reps - Side Stepping with Resistance at Ankles and Counter Support  - 7 x weekly - 3 reps - red band hold - Standing Hip Abduction with Resistance at Ankles and Counter Support  - 7 x weekly - 2 sets - 10 reps - red band hold  ASSESSMENT:  CLINICAL IMPRESSION: Pt  like the KT tape and it is still on her today. Did not re-tape today due pants. Improved tolerance to therex and gait today due less right knee pain. She Does continue to fatigue quickly in closed chain.     EVAL: Patient is a 73 y.o. female who was seen today for physical therapy evaluation and treatment for  Z96.652 (ICD-10-CM) - Total knee replacement status, left  S83.422A (ICD-10-CM) - Sprain of lateral collateral ligament of left knee, initial encounter   Pt presents with decreased LE strength, AROM, and chronic knee pain bilat L>R which is negatively impacting the pt's quality and tolerance for functional mobility. Pt will benefit from skilled PT 2w6 to address impairments to optimize leg function  and mobility with less pain.   OBJECTIVE IMPAIRMENTS: decreased activity tolerance, decreased balance, decreased endurance, decreased mobility, difficulty walking, decreased ROM, decreased strength, and increased edema, and pain.  PERSONAL FACTORS: Age, Fitness, Past/current experiences, Time since onset of injury/illness/exacerbation, and 1-2 comorbidities: high BMI, trunk postural changes are also affecting patient's functional outcome.   REHAB POTENTIAL: Good  CLINICAL DECISION MAKING: Evolving/moderate complexity  EVALUATION COMPLEXITY: Moderate   GOALS:  SHORT TERM GOALS: Target date: 04/08/24 Pt will be Ind in an initial HEP  Baseline: started Goal status: INITIAL  LONG TERM GOALS: Target date: 05/06/24  Pt will be Ind in a final HEP to maintain achieved  LOF  Baseline:  Goal status: INITIAL  2.  Increase pt's bilat knee and hip strengths by 1/2  muscle grade to improve pt's function with daily activities Baseline:  Goal status: INITIAL  3.  Increased pt's bilat knee flexion to 105d or greater to improve pt's to obtain sitting positions Baseline: L 100d, R 90d Goal status: INITIAL  4.  Improve 5xSTS by MCID of 5 and by MCID of 40ft as indication of improved functional mobility  Baseline: 5xSTS= 29.5, 193 with SPC Goal status: INITIAL  5.  Pt will report 50% or greater improvement in her L knee pain for improved function and QOL Baseline: 4-8/10 Goal status: INITIAL  6.  Pt's LEFS score will improve by the the MCID to 49% or greater as indication of improved function  Baseline: 39% Goal status: INITIAL    PLAN:  PT FREQUENCY: 2x/week  PT DURATION: 6 weeks  PLANNED INTERVENTIONS: 97164- PT Re-evaluation, 97110-Therapeutic exercises, 97530- Therapeutic activity, 97112- Neuromuscular re-education, 97535- Self Care, [id number]- Manual therapy, 97116- Gait training, [phone]- Vasopneumatic device, Patient/Family education, Balance training, Stair training, Taping, Joint mobilization, Cryotherapy, and Moist heat  PLAN FOR NEXT SESSION: assess response to HEP; progress therex as indicated; use of modalities, and manual therapy.   Harlene Persons, PTA 03/31/24 11:29 AM Phone: [phone] Fax: [phone]

## 2024-04-05 NOTE — Therapy (Signed)
 OUTPATIENT PHYSICAL THERAPY LOWER EXTREMITY TREATMENT   Patient Name: Alison Whitehead MRN: [id number] DOB:1950/11/30, 73 y.o., female Today's Date: 04/07/2024  END OF SESSION:  PT End of Session - 04/06/24 1139     Visit Number 6    Number of Visits 13    Date for PT Re-Evaluation 05/06/24    Authorization Type [employer] MEDICARE HMO/PPO    PT Start Time 1108    PT Stop Time 1148    PT Time Calculation (min) 40 min    Activity Tolerance Patient tolerated treatment well;Patient limited by pain    Behavior During Therapy [facility] for tasks assessed/performed            Past Medical History:  Diagnosis Date   Allergy    seasonal   Arthritis    CKD (chronic kidney disease)    DDD (degenerative disc disease)    Degenerative disc disease, lumbar    Hypertension    IBS (irritable bowel syndrome)    Osteopenia    Osteoporosis    osteopenia   Stroke (HCC) 09/2019   Past Surgical History:  Procedure Laterality Date   SHOULDER SURGERY Left    lipoma   TOTAL KNEE ARTHROPLASTY Left 12/26/2022   Procedure: LEFT TOTAL KNEE ARTHROPLASTY;  Surgeon: Harden Jerona GAILS, MD;  Location: [facility] OR;  Service: Orthopedics;  Laterality: Left;   Patient Active Problem List   Diagnosis Date Noted   Total knee replacement status, 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   Rotator cuff syndrome, right 01/11/2020   Labile blood glucose    Vascular headache    Small vessel disease, cerebrovascular    Prediabetes    AKI (acute kidney injury) (HCC)    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   Acute cerebrovascular accident (CVA) (HCC) 09/30/2019   Unilateral primary osteoarthritis, left knee 03/17/2019   Unilateral primary osteoarthritis, right knee 03/17/2019   Bilateral primary osteoarthritis of knee 03/10/2019   INSOMNIA, CHRONIC 05/19/2007   SPONDYLOSIS, LUMBAR 05/19/2007   Essential hypertension 05/03/2007   ARTHRITIS, KNEE 05/03/2007    PCP: Bridgette Sluder, PA  REFERRING PROVIDER: Valdemar Rocky SAUNDERS, NP  REFERRING  DIAG:  [phone] (ICD-10-CM) - Total knee replacement status, left  D16.577J (ICD-10-CM) - Sprain of lateral collateral ligament of left knee, initial encounter    THERAPY DIAG:  Chronic pain of left knee  Muscle weakness (generalized)  Difficulty in walking, not elsewhere classified  Other abnormalities of gait and mobility  Rationale for Evaluation and Treatment: Rehabilitation  ONSET DATE: 12/26/22 A long time  SUBJECTIVE:   SUBJECTIVE STATEMENT: Pt reports the KT tape was helpful with decreasing the R knee when walking.  EVAL: Pt reports a chronic Hx of knee pain L>R with a TKA of the L completed 5/24. She notes her knees bother her when walking with them starting to be more painful and swollen the past few months. She also endorses low back pain. She feels like thet are now less swollen.   PERTINENT HISTORY: See above. High BMI.  PAIN:  Are you having pain? Yes: NPRS scale: 6/10   (8/10 right knee, 8/10 left side starting Monday morning)  Pain location: L knee, both sides of the knee Pain description: ache, sharp Aggravating factors: Increased time on feey Relieving factors: Lidocaine  patches  PRECAUTIONS: None  RED FLAGS: None   WEIGHT BEARING RESTRICTIONS: No  FALLS:  Has patient fallen in last 6 months? No  LIVING ENVIRONMENT: Lives with: lives alone Lives in: House/apartment Stairs: No Has following equipment at home:  Single point cane and Walker - 4 wheeled  OCCUPATION: retired  PLOF: Independent with household mobility with device and Independent with community mobility with device  PATIENT GOALS: To walk better s rollator if possible and with less pain  NEXT MD VISIT: Not scheduled  OBJECTIVE:  Note: Objective measures were completed at Evaluation unless otherwise noted.  DIAGNOSTIC FINDINGS:  03/02/24 X-ray L Knee Radiographs of the left knee show stable alignment of total knee  arthroplasty hardware there is no complicating features.  There  is  end-stage osteoarthritis present to the right knee.  With bone-on-bone  contact.   PATIENT SURVEYS:  LEFS: 31/80=39%   COGNITION: Overall cognitive status: Within functional limits for tasks assessed     SENSATION: WFL  EDEMA:  Swelling present  MUSCLE LENGTH: Hamstrings: Right: Tight deg; Left tight deg Thomas test: Right Tight deg; Left Tight deg  POSTURE: increased lumbar lordosis and flexed trunk   PALPATION: TTP to the peri- L knee  LOWER EXTREMITY ROM:  Active ROM Right eval Left eval Right 03/31/24 Left 03/31/24  Hip flexion      Hip extension      Hip abduction      Hip adduction      Hip internal rotation      Hip external rotation      Knee flexion 90 100 110 110  Knee extension 15 lacking 15 lacking    Ankle dorsiflexion      Ankle plantarflexion      Ankle inversion      Ankle eversion       (Blank rows = not tested)  LOWER EXTREMITY MMT:  MMT Right eval Left eval  Hip flexion 3 3  Hip extension 2 2  Hip abduction 3 3  Hip adduction    Hip internal rotation    Hip external rotation 3 3  Knee flexion 4 4  Knee extension 4 4  Ankle dorsiflexion    Ankle plantarflexion    Ankle inversion    Ankle eversion     (Blank rows = not tested)   FUNCTIONAL TESTS:  5 times sit to stand: 29.5  c use of hands 2 minute walk test: TBA  03/23/24: 193 feet with SPC, pt with increased SOB due to exertion  GAIT: Distance walked: 100' Assistive device utilized: Environmental consultant - 4 wheeled Level of assistance: Modified independence Comments: Decreased pace and step length                                                                                                                                TREATMENT DATE:  [facility] Adult PT Treatment:                                                DATE: 04/06/24 Therapeutic Exercise: ELBERT  10 x 2 each  SLR c QS x 10 each  HL ball squeeze 10 x 2  HL clam 10 x 2 GTB Hip abdct x 10 each  Mini Bridge  x 10 c swiss  ball Therapeutic Activity: Nustep L4 UE/LE x 8 minutes  STS x 5  Manual Therapy: KT tape for comfort with walking applied to the right knee, 2 Y strips along the outside borders of the patella  [facility] Adult PT Treatment:                                                DATE: 03/31/24 Therapeutic Exercise: LAQ 10 x 2 each  SLR x 10 each 1 HL ball squeeze 10 x 2  SL clam 10 x 2  Hip abdct x 10 each  Mini Bridge 10 x 2   Therapeutic Activity: Gait 185 feet in 2 minutes with SPC, SPO2 93%  Nustep L4 UE/LE x 10 minutes  STS x 5  PATIENT EDUCATION:  Education details: Eval findings, POC, HEP, self care  Person educated: Patient Education method: Explanation, Demonstration, Tactile cues, Verbal cues, and Handouts Education comprehension: verbalized understanding, returned demonstration, verbal cues required, and tactile cues required  HOME EXERCISE PROGRAM: Access Code: [email or web address] URL: [email or web address] Date: 03/18/2024 Prepared by: Dasie Daft  Exercises - Supine Quadricep Sets  - 7 x weekly - 2 sets - 10 reps - 5 sec hold - Active Straight Leg Raise with Quad Set  - 7 x weekly - 2 sets - 10 reps - Supine Heel Slide with Strap  - 7 x weekly - 2 sets - 10 reps - 5 sec hold - Seated Hamstring Stretch  - 7 x weekly - 2 reps - 30 sec hold - Seated Long Arc Quad  - 7 x weekly - 3 sets - 15 reps - Sit to Stand with Counter Support  - 7 x weekly - 2 sets - 5 reps - Side Stepping with Resistance at Ankles and Counter Support  - 7 x weekly - 3 reps - red band hold - Standing Hip Abduction with Resistance at Ankles and Counter Support  - 7 x weekly - 2 sets - 10 reps - red band hold  ASSESSMENT:  CLINICAL IMPRESSION: Pt has found the Ktape to be beneficial for minimizing the R knee pain when walking. Ktape was reapplied. PT was then completed for bilat knee/LE ROM and strengthening. Pt tolerated prescribed exs in PT today without adverse effects. Pt will continue to benefit  from skilled PT to address impairments for improved knee/LE function with less pain.  EVAL: Patient is a 73 y.o. female who was seen today for physical therapy evaluation and treatment for  Z96.652 (ICD-10-CM) - Total knee replacement status, left  S83.422A (ICD-10-CM) - Sprain of lateral collateral ligament of left knee, initial encounter   Pt presents with decreased LE strength, AROM, and chronic knee pain bilat L>R which is negatively impacting the pt's quality and tolerance for functional mobility. Pt will benefit from skilled PT 2w6 to address impairments to optimize leg function  and mobility with less pain.   OBJECTIVE IMPAIRMENTS: decreased activity tolerance, decreased balance, decreased endurance, decreased mobility, difficulty walking, decreased ROM, decreased strength, and increased edema, and pain.  PERSONAL FACTORS: Age, Fitness, Past/current experiences, Time since onset of injury/illness/exacerbation, and 1-2 comorbidities: high BMI, trunk postural  changes are also affecting patient's functional outcome.   REHAB POTENTIAL: Good  CLINICAL DECISION MAKING: Evolving/moderate complexity  EVALUATION COMPLEXITY: Moderate   GOALS:  SHORT TERM GOALS: Target date: 04/08/24 Pt will be Ind in an initial HEP  Baseline: started Goal status: INITIAL  LONG TERM GOALS: Target date: 05/06/24  Pt will be Ind in a final HEP to maintain achieved  LOF  Baseline:  Goal status: INITIAL  2.  Increase pt's bilat knee and hip strengths by 1/2 muscle grade to improve pt's function with daily activities Baseline:  Goal status: INITIAL  3.  Increased pt's bilat knee flexion to 105d or greater to improve pt's to obtain sitting positions Baseline: L 100d, R 90d Goal status: INITIAL  4.  Improve 5xSTS by MCID of 5 and by MCID of 74ft as indication of improved functional mobility  Baseline: 5xSTS= 29.5, 193 with SPC Goal status: INITIAL  5.  Pt will report 50% or greater  improvement in her L knee pain for improved function and QOL Baseline: 4-8/10 Goal status: INITIAL  6.  Pt's LEFS score will improve by the the MCID to 49% or greater as indication of improved function  Baseline: 39% Goal status: INITIAL    PLAN:  PT FREQUENCY: 2x/week  PT DURATION: 6 weeks  PLANNED INTERVENTIONS: 97164- PT Re-evaluation, 97110-Therapeutic exercises, 97530- Therapeutic activity, 97112- Neuromuscular re-education, 97535- Self Care, 02859- Manual therapy, 97116- Gait training, [phone]- Vasopneumatic device, Patient/Family education, Balance training, Stair training, Taping, Joint mobilization, Cryotherapy, and Moist heat  PLAN FOR NEXT SESSION: assess response to HEP; progress therex as indicated; use of modalities, and manual therapy.   Tine Mabee MS, PT 04/07/24 5:39 AM

## 2024-04-06 ENCOUNTER — Ambulatory Visit: Attending: Family

## 2024-04-06 DIAGNOSIS — M6281 Muscle weakness (generalized): Secondary | ICD-10-CM | POA: Diagnosis present

## 2024-04-06 DIAGNOSIS — G8929 Other chronic pain: Secondary | ICD-10-CM | POA: Diagnosis present

## 2024-04-06 DIAGNOSIS — R262 Difficulty in walking, not elsewhere classified: Secondary | ICD-10-CM | POA: Insufficient documentation

## 2024-04-06 DIAGNOSIS — R2689 Other abnormalities of gait and mobility: Secondary | ICD-10-CM | POA: Insufficient documentation

## 2024-04-06 DIAGNOSIS — M25562 Pain in left knee: Secondary | ICD-10-CM | POA: Diagnosis present

## 2024-04-07 NOTE — Therapy (Deleted)
 OUTPATIENT PHYSICAL THERAPY LOWER EXTREMITY TREATMENT   Patient Name: Alison Whitehead MRN: [id number] DOB:13-Jan-1951, 73 y.o., female Today's Date: 04/07/2024  END OF SESSION:      Past Medical History:  Diagnosis Date   Allergy    seasonal   Arthritis    CKD (chronic kidney disease)    DDD (degenerative disc disease)    Degenerative disc disease, lumbar    Hypertension    IBS (irritable bowel syndrome)    Osteopenia    Osteoporosis    osteopenia   Stroke (HCC) 09/2019   Past Surgical History:  Procedure Laterality Date   SHOULDER SURGERY Left    lipoma   TOTAL KNEE ARTHROPLASTY Left 12/26/2022   Procedure: LEFT TOTAL KNEE ARTHROPLASTY;  Surgeon: Harden Jerona GAILS, MD;  Location: [facility] OR;  Service: Orthopedics;  Laterality: Left;   Patient Active Problem List   Diagnosis Date Noted   Total knee replacement status, 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   Rotator cuff syndrome, right 01/11/2020   Labile blood glucose    Vascular headache    Small vessel disease, cerebrovascular    Prediabetes    AKI (acute kidney injury) (HCC)    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   Acute cerebrovascular accident (CVA) (HCC) 09/30/2019   Unilateral primary osteoarthritis, left knee 03/17/2019   Unilateral primary osteoarthritis, right knee 03/17/2019   Bilateral primary osteoarthritis of knee 03/10/2019   INSOMNIA, CHRONIC 05/19/2007   SPONDYLOSIS, LUMBAR 05/19/2007   Essential hypertension 05/03/2007   ARTHRITIS, KNEE 05/03/2007    PCP: Bridgette Sluder, PA  REFERRING PROVIDER: Valdemar Rocky SAUNDERS, NP  REFERRING DIAG:  [phone] (ICD-10-CM) - Total knee replacement status, left  D16.577J (ICD-10-CM) - Sprain of lateral collateral ligament of left knee, initial encounter    THERAPY DIAG:  No diagnosis found.  Rationale for Evaluation and Treatment: Rehabilitation  ONSET DATE: 12/26/22 A long time  SUBJECTIVE:   SUBJECTIVE STATEMENT: Pt reports the KT tape was helpful with decreasing the R  knee when walking.  EVAL: Pt reports a chronic Hx of knee pain L>R with a TKA of the L completed 5/24. She notes her knees bother her when walking with them starting to be more painful and swollen the past few months. She also endorses low back pain. She feels like thet are now less swollen.   PERTINENT HISTORY: See above. High BMI.  PAIN:  Are you having pain? Yes: NPRS scale: 6/10   (8/10 right knee, 8/10 left side starting Monday morning)  Pain location: L knee, both sides of the knee Pain description: ache, sharp Aggravating factors: Increased time on feey Relieving factors: Lidocaine  patches  PRECAUTIONS: None  RED FLAGS: None   WEIGHT BEARING RESTRICTIONS: No  FALLS:  Has patient fallen in last 6 months? No  LIVING ENVIRONMENT: Lives with: lives alone Lives in: House/apartment Stairs: No Has following equipment at home: Single point cane and Environmental consultant - 4 wheeled  OCCUPATION: retired  PLOF: Independent with household mobility with device and Independent with community mobility with device  PATIENT GOALS: To walk better s rollator if possible and with less pain  NEXT MD VISIT: Not scheduled  OBJECTIVE:  Note: Objective measures were completed at Evaluation unless otherwise noted.  DIAGNOSTIC FINDINGS:  03/02/24 X-ray L Knee Radiographs of the left knee show stable alignment of total knee  arthroplasty hardware there is no complicating features.  There is  end-stage osteoarthritis present to the right knee.  With bone-on-bone  contact.   PATIENT SURVEYS:  LEFS: 31/80=39%  COGNITION: Overall cognitive status: Within functional limits for tasks assessed     SENSATION: WFL  EDEMA:  Swelling present  MUSCLE LENGTH: Hamstrings: Right: Tight deg; Left tight deg Debby test: Right Tight deg; Left Tight deg  POSTURE: increased lumbar lordosis and flexed trunk   PALPATION: TTP to the peri- L knee  LOWER EXTREMITY ROM:  Active ROM Right eval Left eval  Right 03/31/24 Left 03/31/24  Hip flexion      Hip extension      Hip abduction      Hip adduction      Hip internal rotation      Hip external rotation      Knee flexion 90 100 110 110  Knee extension 15 lacking 15 lacking    Ankle dorsiflexion      Ankle plantarflexion      Ankle inversion      Ankle eversion       (Blank rows = not tested)  LOWER EXTREMITY MMT:  MMT Right eval Left eval  Hip flexion 3 3  Hip extension 2 2  Hip abduction 3 3  Hip adduction    Hip internal rotation    Hip external rotation 3 3  Knee flexion 4 4  Knee extension 4 4  Ankle dorsiflexion    Ankle plantarflexion    Ankle inversion    Ankle eversion     (Blank rows = not tested)   FUNCTIONAL TESTS:  5 times sit to stand: 29.5  c use of hands 2 minute walk test: TBA  03/23/24: 193 feet with SPC, pt with increased SOB due to exertion  GAIT: Distance walked: 100' Assistive device utilized: Environmental consultant - 4 wheeled Level of assistance: Modified independence Comments: Decreased pace and step length                                                                                                                                TREATMENT DATE:   [facility] Adult PT Treatment:                                                DATE: 04/08/24 Therapeutic Exercise: *** Manual Therapy: *** Neuromuscular re-ed: *** Therapeutic Activity: *** Modalities: *** Self Care: ***  [facility] Adult PT Treatment:                                                DATE: 04/06/24 Therapeutic Exercise: LAQ 10 x 2 each  SLR c QS x 10 each  HL ball squeeze 10 x 2  HL clam 10 x 2 GTB Hip abdct x 10 each  Mini Bridge  x 10 c swiss ball  Therapeutic Activity: Nustep L4 UE/LE x 8 minutes  STS x 5  Manual Therapy: KT tape for comfort with walking applied to the right knee, 2 Y strips along the outside borders of the patella  [facility] Adult PT Treatment:                                                DATE: 03/31/24 Therapeutic  Exercise: LAQ 10 x 2 each  SLR x 10 each 1 HL ball squeeze 10 x 2  SL clam 10 x 2  Hip abdct x 10 each  Mini Bridge 10 x 2   Therapeutic Activity: Gait 185 feet in 2 minutes with SPC, SPO2 93%  Nustep L4 UE/LE x 10 minutes  STS x 5  PATIENT EDUCATION:  Education details: Eval findings, POC, HEP, self care  Person educated: Patient Education method: Explanation, Demonstration, Tactile cues, Verbal cues, and Handouts Education comprehension: verbalized understanding, returned demonstration, verbal cues required, and tactile cues required  HOME EXERCISE PROGRAM: Access Code: [email or web address] URL: [email or web address] Date: 03/18/2024 Prepared by: Dasie Daft  Exercises - Supine Quadricep Sets  - 7 x weekly - 2 sets - 10 reps - 5 sec hold - Active Straight Leg Raise with Quad Set  - 7 x weekly - 2 sets - 10 reps - Supine Heel Slide with Strap  - 7 x weekly - 2 sets - 10 reps - 5 sec hold - Seated Hamstring Stretch  - 7 x weekly - 2 reps - 30 sec hold - Seated Long Arc Quad  - 7 x weekly - 3 sets - 15 reps - Sit to Stand with Counter Support  - 7 x weekly - 2 sets - 5 reps - Side Stepping with Resistance at Ankles and Counter Support  - 7 x weekly - 3 reps - red band hold - Standing Hip Abduction with Resistance at Ankles and Counter Support  - 7 x weekly - 2 sets - 10 reps - red band hold  ASSESSMENT:  CLINICAL IMPRESSION: Pt has found the Ktape to be beneficial for minimizing the R knee pain when walking. Ktape was reapplied. PT was then completed for bilat knee/LE ROM and strengthening. Pt tolerated prescribed exs in PT today without adverse effects. Pt will continue to benefit from skilled PT to address impairments for improved knee/LE function with less pain.  EVAL: Patient is a 73 y.o. female who was seen today for physical therapy evaluation and treatment for  Z96.652 (ICD-10-CM) - Total knee replacement status, left  S83.422A (ICD-10-CM) - Sprain of lateral  collateral ligament of left knee, initial encounter   Pt presents with decreased LE strength, AROM, and chronic knee pain bilat L>R which is negatively impacting the pt's quality and tolerance for functional mobility. Pt will benefit from skilled PT 2w6 to address impairments to optimize leg function  and mobility with less pain.   OBJECTIVE IMPAIRMENTS: decreased activity tolerance, decreased balance, decreased endurance, decreased mobility, difficulty walking, decreased ROM, decreased strength, and increased edema, and pain.  PERSONAL FACTORS: Age, Fitness, Past/current experiences, Time since onset of injury/illness/exacerbation, and 1-2 comorbidities: high BMI, trunk postural changes are also affecting patient's functional outcome.   REHAB POTENTIAL: Good  CLINICAL DECISION MAKING: Evolving/moderate complexity  EVALUATION COMPLEXITY: Moderate   GOALS:  SHORT TERM GOALS: Target date: 04/08/24 Pt will be Ind in an initial  HEP  Baseline: started Goal status: INITIAL  LONG TERM GOALS: Target date: 05/06/24  Pt will be Ind in a final HEP to maintain achieved  LOF  Baseline:  Goal status: INITIAL  2.  Increase pt's bilat knee and hip strengths by 1/2 muscle grade to improve pt's function with daily activities Baseline:  Goal status: INITIAL  3.  Increased pt's bilat knee flexion to 105d or greater to improve pt's to obtain sitting positions Baseline: L 100d, R 90d Goal status: INITIAL  4.  Improve 5xSTS by MCID of 5 and by MCID of 85ft as indication of improved functional mobility  Baseline: 5xSTS= 29.5, 193 with SPC Goal status: INITIAL  5.  Pt will report 50% or greater improvement in her L knee pain for improved function and QOL Baseline: 4-8/10 Goal status: INITIAL  6.  Pt's LEFS score will improve by the the MCID to 49% or greater as indication of improved function  Baseline: 39% Goal status: INITIAL    PLAN:  PT FREQUENCY: 2x/week  PT DURATION: 6  weeks  PLANNED INTERVENTIONS: 97164- PT Re-evaluation, 97110-Therapeutic exercises, 97530- Therapeutic activity, 97112- Neuromuscular re-education, 97535- Self Care, [id number]- Manual therapy, 97116- Gait training, [phone]- Vasopneumatic device, Patient/Family education, Balance training, Stair training, Taping, Joint mobilization, Cryotherapy, and Moist heat  PLAN FOR NEXT SESSION: assess response to HEP; progress therex as indicated; use of modalities, and manual therapy.   Allen Ralls MS, PT 04/07/24 1:23 PM

## 2024-04-08 ENCOUNTER — Ambulatory Visit: Admitting: Physical Therapy

## 2024-04-08 ENCOUNTER — Telehealth: Payer: Self-pay | Admitting: Physical Therapy

## 2024-04-08 NOTE — Telephone Encounter (Signed)
 Patient was called regarding her missed appt today at 8:45 Left voicemall and reminder of our attendance policy as well as her next appointment next week.   Delon Norma, PT 04/08/24 9:12 AM Phone: [phone] Fax: [phone]

## 2024-04-11 NOTE — Therapy (Incomplete)
 OUTPATIENT PHYSICAL THERAPY LOWER EXTREMITY TREATMENT   Patient Name: Alison Whitehead MRN: [id number] DOB:06/29/1951, 73 y.o., female Today's Date: 04/11/2024  END OF SESSION:      Past Medical History:  Diagnosis Date   Allergy    seasonal   Arthritis    CKD (chronic kidney disease)    DDD (degenerative disc disease)    Degenerative disc disease, lumbar    Hypertension    IBS (irritable bowel syndrome)    Osteopenia    Osteoporosis    osteopenia   Stroke (HCC) 09/2019   Past Surgical History:  Procedure Laterality Date   SHOULDER SURGERY Left    lipoma   TOTAL KNEE ARTHROPLASTY Left 12/26/2022   Procedure: LEFT TOTAL KNEE ARTHROPLASTY;  Surgeon: Harden Jerona GAILS, MD;  Location: [facility] OR;  Service: Orthopedics;  Laterality: Left;   Patient Active Problem List   Diagnosis Date Noted   Total knee replacement status, 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   Rotator cuff syndrome, right 01/11/2020   Labile blood glucose    Vascular headache    Small vessel disease, cerebrovascular    Prediabetes    AKI (acute kidney injury) (HCC)    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   Acute cerebrovascular accident (CVA) (HCC) 09/30/2019   Unilateral primary osteoarthritis, left knee 03/17/2019   Unilateral primary osteoarthritis, right knee 03/17/2019   Bilateral primary osteoarthritis of knee 03/10/2019   INSOMNIA, CHRONIC 05/19/2007   SPONDYLOSIS, LUMBAR 05/19/2007   Essential hypertension 05/03/2007   ARTHRITIS, KNEE 05/03/2007    PCP: Bridgette Sluder, PA  REFERRING PROVIDER: Valdemar Rocky SAUNDERS, NP  REFERRING DIAG:  [phone] (ICD-10-CM) - Total knee replacement status, left  D16.577J (ICD-10-CM) - Sprain of lateral collateral ligament of left knee, initial encounter    THERAPY DIAG:  No diagnosis found.  Rationale for Evaluation and Treatment: Rehabilitation  ONSET DATE: 12/26/22 A long time  SUBJECTIVE:   SUBJECTIVE STATEMENT: Pt reports the KT tape was helpful with decreasing the R  knee when walking.  EVAL: Pt reports a chronic Hx of knee pain L>R with a TKA of the L completed 5/24. She notes her knees bother her when walking with them starting to be more painful and swollen the past few months. She also endorses low back pain. She feels like thet are now less swollen.   PERTINENT HISTORY: See above. High BMI.  PAIN:  Are you having pain? Yes: NPRS scale: 6/10   (8/10 right knee, 8/10 left side starting Monday morning)  Pain location: L knee, both sides of the knee Pain description: ache, sharp Aggravating factors: Increased time on feey Relieving factors: Lidocaine  patches  PRECAUTIONS: None  RED FLAGS: None   WEIGHT BEARING RESTRICTIONS: No  FALLS:  Has patient fallen in last 6 months? No  LIVING ENVIRONMENT: Lives with: lives alone Lives in: House/apartment Stairs: No Has following equipment at home: Single point cane and Environmental consultant - 4 wheeled  OCCUPATION: retired  PLOF: Independent with household mobility with device and Independent with community mobility with device  PATIENT GOALS: To walk better s rollator if possible and with less pain  NEXT MD VISIT: Not scheduled  OBJECTIVE:  Note: Objective measures were completed at Evaluation unless otherwise noted.  DIAGNOSTIC FINDINGS:  03/02/24 X-ray L Knee Radiographs of the left knee show stable alignment of total knee  arthroplasty hardware there is no complicating features.  There is  end-stage osteoarthritis present to the right knee.  With bone-on-bone  contact.   PATIENT SURVEYS:  LEFS: 31/80=39%  COGNITION: Overall cognitive status: Within functional limits for tasks assessed     SENSATION: WFL  EDEMA:  Swelling present  MUSCLE LENGTH: Hamstrings: Right: Tight deg; Left tight deg Thomas test: Right Tight deg; Left Tight deg  POSTURE: increased lumbar lordosis and flexed trunk   PALPATION: TTP to the peri- L knee  LOWER EXTREMITY ROM:  Active ROM Right eval Left eval  Right 03/31/24 Left 03/31/24  Hip flexion      Hip extension      Hip abduction      Hip adduction      Hip internal rotation      Hip external rotation      Knee flexion 90 100 110 110  Knee extension 15 lacking 15 lacking    Ankle dorsiflexion      Ankle plantarflexion      Ankle inversion      Ankle eversion       (Blank rows = not tested)  LOWER EXTREMITY MMT:  MMT Right eval Left eval  Hip flexion 3 3  Hip extension 2 2  Hip abduction 3 3  Hip adduction    Hip internal rotation    Hip external rotation 3 3  Knee flexion 4 4  Knee extension 4 4  Ankle dorsiflexion    Ankle plantarflexion    Ankle inversion    Ankle eversion     (Blank rows = not tested)   FUNCTIONAL TESTS:  5 times sit to stand: 29.5  c use of hands 2 minute walk test: TBA  03/23/24: 193 feet with SPC, pt with increased SOB due to exertion  GAIT: Distance walked: 100' Assistive device utilized: Environmental consultant - 4 wheeled Level of assistance: Modified independence Comments: Decreased pace and step length                                                                                                                                TREATMENT DATE:  [facility] Adult PT Treatment:                                                DATE: 04/12/24 Therapeutic Exercise: LAQ 10 x 2 each  SLR c QS x 10 each  HL ball squeeze 10 x 2  HL clam 10 x 2 GTB Hip abdct x 10 each  Mini Bridge  x 10 c swiss ball Therapeutic Activity: Nustep L4 UE/LE x 8 minutes  STS x 5  Manual Therapy: KT tape for comfort with walking applied to the right knee, 2 Y strips along the outside borders of the patella Therapeutic Exercise: *** Manual Therapy: *** Neuromuscular re-ed: *** Therapeutic Activity: *** Modalities: *** Self Care: ***    [facility] Adult PT Treatment:  DATE: 04/06/24 Therapeutic Exercise: LAQ 10 x 2 each  SLR c QS x 10 each  HL ball squeeze 10 x 2  HL clam 10 x 2  GTB Hip abdct x 10 each  Mini Bridge  x 10 c swiss ball Therapeutic Activity: Nustep L4 UE/LE x 8 minutes  STS x 5  Manual Therapy: KT tape for comfort with walking applied to the right knee, 2 Y strips along the outside borders of the patella  [facility] Adult PT Treatment:                                                DATE: 03/31/24 Therapeutic Exercise: LAQ 10 x 2 each  SLR x 10 each 1 HL ball squeeze 10 x 2  SL clam 10 x 2  Hip abdct x 10 each  Mini Bridge 10 x 2   Therapeutic Activity: Gait 185 feet in 2 minutes with SPC, SPO2 93%  Nustep L4 UE/LE x 10 minutes  STS x 5  PATIENT EDUCATION:  Education details: Eval findings, POC, HEP, self care  Person educated: Patient Education method: Explanation, Demonstration, Tactile cues, Verbal cues, and Handouts Education comprehension: verbalized understanding, returned demonstration, verbal cues required, and tactile cues required  HOME EXERCISE PROGRAM: Access Code: [email or web address] URL: [email or web address] Date: 03/18/2024 Prepared by: Dasie Daft  Exercises - Supine Quadricep Sets  - 7 x weekly - 2 sets - 10 reps - 5 sec hold - Active Straight Leg Raise with Quad Set  - 7 x weekly - 2 sets - 10 reps - Supine Heel Slide with Strap  - 7 x weekly - 2 sets - 10 reps - 5 sec hold - Seated Hamstring Stretch  - 7 x weekly - 2 reps - 30 sec hold - Seated Long Arc Quad  - 7 x weekly - 3 sets - 15 reps - Sit to Stand with Counter Support  - 7 x weekly - 2 sets - 5 reps - Side Stepping with Resistance at Ankles and Counter Support  - 7 x weekly - 3 reps - red band hold - Standing Hip Abduction with Resistance at Ankles and Counter Support  - 7 x weekly - 2 sets - 10 reps - red band hold  ASSESSMENT:  CLINICAL IMPRESSION: Pt has found the Ktape to be beneficial for minimizing the R knee pain when walking. Ktape was reapplied. PT was then completed for bilat knee/LE ROM and strengthening. Pt tolerated prescribed exs in PT today  without adverse effects. Pt will continue to benefit from skilled PT to address impairments for improved knee/LE function with less pain.  EVAL: Patient is a 73 y.o. female who was seen today for physical therapy evaluation and treatment for  Z96.652 (ICD-10-CM) - Total knee replacement status, left  S83.422A (ICD-10-CM) - Sprain of lateral collateral ligament of left knee, initial encounter   Pt presents with decreased LE strength, AROM, and chronic knee pain bilat L>R which is negatively impacting the pt's quality and tolerance for functional mobility. Pt will benefit from skilled PT 2w6 to address impairments to optimize leg function  and mobility with less pain.   OBJECTIVE IMPAIRMENTS: decreased activity tolerance, decreased balance, decreased endurance, decreased mobility, difficulty walking, decreased ROM, decreased strength, and increased edema, and pain.  PERSONAL FACTORS: Age, Fitness, Past/current experiences, Time since onset of injury/illness/exacerbation, and 1-2  comorbidities: high BMI, trunk postural changes are also affecting patient's functional outcome.   REHAB POTENTIAL: Good  CLINICAL DECISION MAKING: Evolving/moderate complexity  EVALUATION COMPLEXITY: Moderate   GOALS:  SHORT TERM GOALS: Target date: 04/08/24 Pt will be Ind in an initial HEP  Baseline: started Goal status: INITIAL  LONG TERM GOALS: Target date: 05/06/24  Pt will be Ind in a final HEP to maintain achieved  LOF  Baseline:  Goal status: INITIAL  2.  Increase pt's bilat knee and hip strengths by 1/2 muscle grade to improve pt's function with daily activities Baseline:  Goal status: INITIAL  3.  Increased pt's bilat knee flexion to 105d or greater to improve pt's to obtain sitting positions Baseline: L 100d, R 90d Goal status: INITIAL  4.  Improve 5xSTS by MCID of 5 and by MCID of 68ft as indication of improved functional mobility  Baseline: 5xSTS= 29.5, 193 with SPC Goal status:  INITIAL  5.  Pt will report 50% or greater improvement in her L knee pain for improved function and QOL Baseline: 4-8/10 Goal status: INITIAL  6.  Pt's LEFS score will improve by the the MCID to 49% or greater as indication of improved function  Baseline: 39% Goal status: INITIAL    PLAN:  PT FREQUENCY: 2x/week  PT DURATION: 6 weeks  PLANNED INTERVENTIONS: 97164- PT Re-evaluation, 97110-Therapeutic exercises, 97530- Therapeutic activity, 97112- Neuromuscular re-education, 97535- Self Care, 02859- Manual therapy, 97116- Gait training, [phone]- Vasopneumatic device, Patient/Family education, Balance training, Stair training, Taping, Joint mobilization, Cryotherapy, and Moist heat  PLAN FOR NEXT SESSION: assess response to HEP; progress therex as indicated; use of modalities, and manual therapy.   Journii Nierman MS, PT 04/11/24 8:29 PM

## 2024-04-12 ENCOUNTER — Ambulatory Visit: Payer: Self-pay

## 2024-04-14 ENCOUNTER — Ambulatory Visit: Payer: Self-pay | Admitting: Physical Therapy

## 2024-04-14 DIAGNOSIS — R262 Difficulty in walking, not elsewhere classified: Secondary | ICD-10-CM

## 2024-04-14 DIAGNOSIS — R2689 Other abnormalities of gait and mobility: Secondary | ICD-10-CM

## 2024-04-14 DIAGNOSIS — M25562 Pain in left knee: Secondary | ICD-10-CM | POA: Diagnosis not present

## 2024-04-14 DIAGNOSIS — M6281 Muscle weakness (generalized): Secondary | ICD-10-CM

## 2024-04-14 DIAGNOSIS — G8929 Other chronic pain: Secondary | ICD-10-CM

## 2024-04-14 NOTE — Therapy (Signed)
 OUTPATIENT PHYSICAL THERAPY LOWER EXTREMITY TREATMENT   Patient Name: Alison Whitehead MRN: [id number] DOB:Dec 24, 1950, 73 y.o., female Today's Date: 04/14/2024  END OF SESSION:  PT End of Session - 04/14/24 1019     Visit Number 7    Number of Visits 13    Date for PT Re-Evaluation 05/06/24    Authorization Type [employer] MEDICARE HMO/PPO    PT Start Time 1018    PT Stop Time 1100    PT Time Calculation (min) 42 min            Past Medical History:  Diagnosis Date   Allergy    seasonal   Arthritis    CKD (chronic kidney disease)    DDD (degenerative disc disease)    Degenerative disc disease, lumbar    Hypertension    IBS (irritable bowel syndrome)    Osteopenia    Osteoporosis    osteopenia   Stroke (HCC) 09/2019   Past Surgical History:  Procedure Laterality Date   SHOULDER SURGERY Left    lipoma   TOTAL KNEE ARTHROPLASTY Left 12/26/2022   Procedure: LEFT TOTAL KNEE ARTHROPLASTY;  Surgeon: Harden Jerona GAILS, MD;  Location: [facility] OR;  Service: Orthopedics;  Laterality: Left;   Patient Active Problem List   Diagnosis Date Noted   Total knee replacement status, 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   Rotator cuff syndrome, right 01/11/2020   Labile blood glucose    Vascular headache    Small vessel disease, cerebrovascular    Prediabetes    AKI (acute kidney injury) (HCC)    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   Acute cerebrovascular accident (CVA) (HCC) 09/30/2019   Unilateral primary osteoarthritis, left knee 03/17/2019   Unilateral primary osteoarthritis, right knee 03/17/2019   Bilateral primary osteoarthritis of knee 03/10/2019   INSOMNIA, CHRONIC 05/19/2007   SPONDYLOSIS, LUMBAR 05/19/2007   Essential hypertension 05/03/2007   ARTHRITIS, KNEE 05/03/2007    PCP: Bridgette Sluder, PA  REFERRING PROVIDER: Valdemar Rocky SAUNDERS, NP  REFERRING DIAG:  [id number] (ICD-10-CM) - Total knee replacement status, left  D16.577J (ICD-10-CM) - Sprain of lateral collateral ligament of left  knee, initial encounter    THERAPY DIAG:  Chronic pain of left knee  Muscle weakness (generalized)  Difficulty in walking, not elsewhere classified  Other abnormalities of gait and mobility  Rationale for Evaluation and Treatment: Rehabilitation  ONSET DATE: 12/26/22 A long time  SUBJECTIVE:   SUBJECTIVE STATEMENT: Pt reports the KT tape was helpful with decreasing the R knee when walking.   EVAL: Pt reports a chronic Hx of knee pain L>R with a TKA of the L completed 5/24. She notes her knees bother her when walking with them starting to be more painful and swollen the past few months. She also endorses low back pain. She feels like thet are now less swollen.   PERTINENT HISTORY: See above. High BMI.  PAIN:  Are you having pain? Yes: NPRS scale: 5/10   (8/10 right knee, 8/10 left side starting Monday morning)  Pain location: L knee, both sides of the knee Pain description: ache, sharp Aggravating factors: Increased time on feey Relieving factors: Lidocaine  patches  PRECAUTIONS: None  RED FLAGS: None   WEIGHT BEARING RESTRICTIONS: No  FALLS:  Has patient fallen in last 6 months? No  LIVING ENVIRONMENT: Lives with: lives alone Lives in: House/apartment Stairs: No Has following equipment at home: Single point cane and Environmental consultant - 4 wheeled  OCCUPATION: retired  PLOF: Independent with household mobility with device and Independent  with community mobility with device  PATIENT GOALS: To walk better s rollator if possible and with less pain  NEXT MD VISIT: Not scheduled  OBJECTIVE:  Note: Objective measures were completed at Evaluation unless otherwise noted.  DIAGNOSTIC FINDINGS:  03/02/24 X-ray L Knee Radiographs of the left knee show stable alignment of total knee  arthroplasty hardware there is no complicating features.  There is  end-stage osteoarthritis present to the right knee.  With bone-on-bone  contact.   PATIENT SURVEYS:  LEFS:  31/80=39%   COGNITION: Overall cognitive status: Within functional limits for tasks assessed     SENSATION: WFL  EDEMA:  Swelling present  MUSCLE LENGTH: Hamstrings: Right: Tight deg; Left tight deg Thomas test: Right Tight deg; Left Tight deg  POSTURE: increased lumbar lordosis and flexed trunk   PALPATION: TTP to the peri- L knee  LOWER EXTREMITY ROM:  Active ROM Right eval Left eval Right 03/31/24 Left 03/31/24  Hip flexion      Hip extension      Hip abduction      Hip adduction      Hip internal rotation      Hip external rotation      Knee flexion 90 100 110 110  Knee extension 15 lacking 15 lacking    Ankle dorsiflexion      Ankle plantarflexion      Ankle inversion      Ankle eversion       (Blank rows = not tested)  LOWER EXTREMITY MMT:  MMT Right eval Left eval  Hip flexion 3 3  Hip extension 2 2  Hip abduction 3 3  Hip adduction    Hip internal rotation    Hip external rotation 3 3  Knee flexion 4 4  Knee extension 4 4  Ankle dorsiflexion    Ankle plantarflexion    Ankle inversion    Ankle eversion     (Blank rows = not tested)   FUNCTIONAL TESTS:  5 times sit to stand: 29.5  c use of hands 2 minute walk test: TBA  03/23/24: 193 feet with SPC, pt with increased SOB due to exertion  GAIT: Distance walked: 100' Assistive device utilized: Environmental consultant - 4 wheeled Level of assistance: Modified independence Comments: Decreased pace and step length                                                                                                                                TREATMENT DATE:  [facility] Adult PT Treatment:                                                DATE: 04/14/24 Therapeutic Exercise: LAQ 3# 10 x 2 each  March 3# x 20 HL Blue band 2 x 20  Side hip abdct x  10 each  Bridge with ball 10 x 2  Manual Therapy: KT tape for comfort with walking applied to the right knee, 2 Y strips along the outside borders of the  patella  Therapeutic Activity: Nustep L5 UE/LE x 9 minutes  2 MWT 206 feet with [facility]    [facility] Adult PT Treatment:                                                DATE: 04/06/24 Therapeutic Exercise: LAQ 10 x 2 each  SLR c QS x 10 each  HL ball squeeze 10 x 2  HL clam 10 x 2 GTB Hip abdct x 10 each  Mini Bridge  x 10 c swiss ball Therapeutic Activity: Nustep L4 UE/LE x 8 minutes  STS x 5  Manual Therapy: KT tape for comfort with walking applied to the right knee, 2 Y strips along the outside borders of the patella  [facility] Adult PT Treatment:                                                DATE: 03/31/24 Therapeutic Exercise: LAQ 10 x 2 each  SLR x 10 each 1 HL ball squeeze 10 x 2  SL clam 10 x 2  Hip abdct x 10 each  Mini Bridge 10 x 2   Therapeutic Activity: Gait 185 feet in 2 minutes with SPC, SPO2 93%  Nustep L4 UE/LE x 10 minutes  STS x 5  PATIENT EDUCATION:  Education details: Eval findings, POC, HEP, self care  Person educated: Patient Education method: Explanation, Demonstration, Tactile cues, Verbal cues, and Handouts Education comprehension: verbalized understanding, returned demonstration, verbal cues required, and tactile cues required  HOME EXERCISE PROGRAM: Access Code: [email or web address] URL: [email or web address] Date: 03/18/2024 Prepared by: Dasie Daft  Exercises - Supine Quadricep Sets  - 7 x weekly - 2 sets - 10 reps - 5 sec hold - Active Straight Leg Raise with Quad Set  - 7 x weekly - 2 sets - 10 reps - Supine Heel Slide with Strap  - 7 x weekly - 2 sets - 10 reps - 5 sec hold - Seated Hamstring Stretch  - 7 x weekly - 2 reps - 30 sec hold - Seated Long Arc Quad  - 7 x weekly - 3 sets - 15 reps - Sit to Stand with Counter Support  - 7 x weekly - 2 sets - 5 reps - Side Stepping with Resistance at Ankles and Counter Support  - 7 x weekly - 3 reps - red band hold - Standing Hip Abduction with Resistance at Ankles and Counter Support  - 7 x weekly -  2 sets - 10 reps - red band hold  ASSESSMENT:  CLINICAL IMPRESSION: Pt has found the Ktape to be beneficial for minimizing the R knee pain when walking. Ktape was reapplied. PT was then completed for bilat knee/LE ROM and strengthening. Pt tolerated prescribed exs in PT today without adverse effects. Her 2 MWT has improved. Pt will continue to benefit from skilled PT to address impairments for improved knee/LE function with less pain.   EVAL: Patient is a 74 y.o. female who was seen today for physical therapy evaluation and treatment  for  [phone] (ICD-10-CM) - Total knee replacement status, left  S83.422A (ICD-10-CM) - Sprain of lateral collateral ligament of left knee, initial encounter   Pt presents with decreased LE strength, AROM, and chronic knee pain bilat L>R which is negatively impacting the pt's quality and tolerance for functional mobility. Pt will benefit from skilled PT 2w6 to address impairments to optimize leg function  and mobility with less pain.   OBJECTIVE IMPAIRMENTS: decreased activity tolerance, decreased balance, decreased endurance, decreased mobility, difficulty walking, decreased ROM, decreased strength, and increased edema, and pain.  PERSONAL FACTORS: Age, Fitness, Past/current experiences, Time since onset of injury/illness/exacerbation, and 1-2 comorbidities: high BMI, trunk postural changes are also affecting patient's functional outcome.   REHAB POTENTIAL: Good  CLINICAL DECISION MAKING: Evolving/moderate complexity  EVALUATION COMPLEXITY: Moderate   GOALS:  SHORT TERM GOALS: Target date: 04/08/24 Pt will be Ind in an initial HEP  Baseline: started Goal status: MET  LONG TERM GOALS: Target date: 05/06/24  Pt will be Ind in a final HEP to maintain achieved  LOF  Baseline:  Goal status: INITIAL  2.  Increase pt's bilat knee and hip strengths by 1/2 muscle grade to improve pt's function with daily activities Baseline:  Goal status: INITIAL  3.   Increased pt's bilat knee flexion to 105d or greater to improve pt's to obtain sitting positions Baseline: L 100d, R 90d Goal status: INITIAL  4.  Improve 5xSTS by MCID of 5 and by MCID of 59ft as indication of improved functional mobility  Baseline: 5xSTS= 29.5, 193 with SPC Goal status: INITIAL  5.  Pt will report 50% or greater improvement in her L knee pain for improved function and QOL Baseline: 4-8/10 Goal status: INITIAL  6.  Pt's LEFS score will improve by the the MCID to 49% or greater as indication of improved function  Baseline: 39% Goal status: INITIAL    PLAN:  PT FREQUENCY: 2x/week  PT DURATION: 6 weeks  PLANNED INTERVENTIONS: 97164- PT Re-evaluation, 97110-Therapeutic exercises, 97530- Therapeutic activity, 97112- Neuromuscular re-education, 97535- Self Care, 02859- Manual therapy, 97116- Gait training, [phone]- Vasopneumatic device, Patient/Family education, Balance training, Stair training, Taping, Joint mobilization, Cryotherapy, and Moist heat  PLAN FOR NEXT SESSION: assess response to HEP; progress therex as indicated; use of modalities, and manual therapy.   Harlene Persons, PTA 04/14/24 11:42 AM Phone: [phone] Fax: [phone]

## 2024-04-19 ENCOUNTER — Encounter: Payer: Self-pay | Admitting: Physical Therapy

## 2024-04-19 ENCOUNTER — Ambulatory Visit: Payer: Self-pay | Admitting: Physical Therapy

## 2024-04-19 DIAGNOSIS — G8929 Other chronic pain: Secondary | ICD-10-CM

## 2024-04-19 DIAGNOSIS — M6281 Muscle weakness (generalized): Secondary | ICD-10-CM

## 2024-04-19 DIAGNOSIS — M25562 Pain in left knee: Secondary | ICD-10-CM | POA: Diagnosis not present

## 2024-04-19 NOTE — Patient Instructions (Signed)
 Aquatic Therapy at [facility]-  What to Expect!  Where:   [facility] [address] [place], [place] [address] Rehab phone [phone]  NOTE:  You will receive an automated phone message reminding you of your appt and it will say the appointment is at the [address] [facility] clinic.          How to Prepare: Please make sure you drink 8 ounces of water about one hour prior to your pool session A caregiver may attend if needed with the patient to help assist as needed. A caregiver can sit in the pool room on chair. Please arrive IN YOUR SUIT and 15 minutes prior to your appointment - this helps to avoid delays in starting your session. Please make sure to attend to any toileting needs prior to entering the pool [place] rooms for changing are provided.   There is direct access to the pool deck form the locker room.  You can lock your belongings in a locker with lock provided. Once on the pool deck your therapist will ask if you have signed the Patient  Consent and Assignment of Benefits form before beginning treatment Your therapist may take your blood pressure prior to, during and after your session if indicated We usually try and create a home exercise program based on activities we do in the pool.  Please be thinking about who might be able to assist you in the pool should you need to participate in an aquatic home exercise program at the time of discharge if you need assistance.  Some patients do not want to or do not have the ability to participate in an aquatic home program - this is not a barrier in any way to you participating in aquatic therapy as part of your current therapy plan! After Discharge from PT, you can continue using home program at  the [facility], there is a drop-in fee for $5 ($45 a month)or for 60 years  or older $4.00 ($40 a month for seniors ) or any local [employer] pool.  Memberships for purchase are  available for gym/pool at [facility]  IT IS VERY IMPORTANT THAT YOUR LAST VISIT BE IN THE CLINIC AT [facility] [address] AFTER YOUR LAST AQUATIC VISIT.  PLEASE MAKE SURE THAT YOU HAVE A [address]  APPOINTMENT SCHEDULED.   About the pool: Pool is located approximately 500 FT from the entrance of the building.  Please bring a support person if you need assistance traveling this      distance.   Your therapist will assist you in entering the water; there are two ways to           enter: stairs with railings, and a mechanical lift. Your therapist will determine the most appropriate way for you.  Water temperature is usually between 88-90 degrees  There may be up to 2 other swimmers in the pool at the same time  The pool deck is tile, please wear shoes with good traction if you prefer not to be barefoot.    Contact Info:  For appointment scheduling and cancellations:         Please call the [facility]  PH:[phone]              Aquatic Therapy  Outpatient Rehabilitation @ [facility]       All sessions are 45 minutes

## 2024-04-19 NOTE — Therapy (Signed)
 OUTPATIENT PHYSICAL THERAPY LOWER EXTREMITY TREATMENT   Patient Name: Alison Whitehead MRN: [id number] DOB:12/15/1950, 73 y.o., female Today's Date: 04/19/2024  END OF SESSION:  PT End of Session - 04/19/24 1445     Visit Number 8    Number of Visits 13    Date for PT Re-Evaluation 05/06/24    Authorization Type [employer] MEDICARE HMO/PPO    PT Start Time 0245    PT Stop Time 0330    PT Time Calculation (min) 45 min            Past Medical History:  Diagnosis Date   Allergy    seasonal   Arthritis    CKD (chronic kidney disease)    DDD (degenerative disc disease)    Degenerative disc disease, lumbar    Hypertension    IBS (irritable bowel syndrome)    Osteopenia    Osteoporosis    osteopenia   Stroke (HCC) 09/2019   Past Surgical History:  Procedure Laterality Date   SHOULDER SURGERY Left    lipoma   TOTAL KNEE ARTHROPLASTY Left 12/26/2022   Procedure: LEFT TOTAL KNEE ARTHROPLASTY;  Surgeon: Harden Jerona GAILS, MD;  Location: [facility] OR;  Service: Orthopedics;  Laterality: Left;   Patient Active Problem List   Diagnosis Date Noted   Total knee replacement status, 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   Rotator cuff syndrome, right 01/11/2020   Labile blood glucose    Vascular headache    Small vessel disease, cerebrovascular    Prediabetes    AKI (acute kidney injury) (HCC)    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   Acute cerebrovascular accident (CVA) (HCC) 09/30/2019   Unilateral primary osteoarthritis, left knee 03/17/2019   Unilateral primary osteoarthritis, right knee 03/17/2019   Bilateral primary osteoarthritis of knee 03/10/2019   INSOMNIA, CHRONIC 05/19/2007   SPONDYLOSIS, LUMBAR 05/19/2007   Essential hypertension 05/03/2007   ARTHRITIS, KNEE 05/03/2007    PCP: Bridgette Sluder, PA  REFERRING PROVIDER: Valdemar Rocky SAUNDERS, NP  REFERRING DIAG:  [phone] (ICD-10-CM) - Total knee replacement status, left  D16.577J (ICD-10-CM) - Sprain of lateral collateral ligament of left  knee, initial encounter    THERAPY DIAG:  Chronic pain of left knee  Muscle weakness (generalized)  Rationale for Evaluation and Treatment: Rehabilitation  ONSET DATE: 12/26/22 A long time  SUBJECTIVE:   SUBJECTIVE STATEMENT: Pt reports her knees were doing well until she took a nap and got stiff.   EVAL: Pt reports a chronic Hx of knee pain L>R with a TKA of the L completed 5/24. She notes her knees bother her when walking with them starting to be more painful and swollen the past few months. She also endorses low back pain. She feels like thet are now less swollen.   PERTINENT HISTORY: See above. High BMI.  PAIN:  Are you having pain? Yes: NPRS scale: 5/10   (8/10 right knee, 8/10 left side starting Monday morning)  Pain location: L knee, both sides of the knee Pain description: ache, sharp Aggravating factors: Increased time on feey Relieving factors: Lidocaine  patches  PRECAUTIONS: None  RED FLAGS: None   WEIGHT BEARING RESTRICTIONS: No  FALLS:  Has patient fallen in last 6 months? No  LIVING ENVIRONMENT: Lives with: lives alone Lives in: House/apartment Stairs: No Has following equipment at home: Single point cane and Environmental consultant - 4 wheeled  OCCUPATION: retired  PLOF: Independent with household mobility with device and Independent with community mobility with device  PATIENT GOALS: To walk better s rollator  if possible and with less pain  NEXT MD VISIT: Not scheduled  OBJECTIVE:  Note: Objective measures were completed at Evaluation unless otherwise noted.  DIAGNOSTIC FINDINGS:  03/02/24 X-ray L Knee Radiographs of the left knee show stable alignment of total knee  arthroplasty hardware there is no complicating features.  There is  end-stage osteoarthritis present to the right knee.  With bone-on-bone  contact.   PATIENT SURVEYS:  LEFS: 31/80=39%   COGNITION: Overall cognitive status: Within functional limits for tasks  assessed     SENSATION: WFL  EDEMA:  Swelling present  MUSCLE LENGTH: Hamstrings: Right: Tight deg; Left tight deg Thomas test: Right Tight deg; Left Tight deg  POSTURE: increased lumbar lordosis and flexed trunk   PALPATION: TTP to the peri- L knee  LOWER EXTREMITY ROM:  Active ROM Right eval Left eval Right 03/31/24 Left 03/31/24  Hip flexion      Hip extension      Hip abduction      Hip adduction      Hip internal rotation      Hip external rotation      Knee flexion 90 100 110 110  Knee extension 15 lacking 15 lacking    Ankle dorsiflexion      Ankle plantarflexion      Ankle inversion      Ankle eversion       (Blank rows = not tested)  LOWER EXTREMITY MMT:  MMT Right eval Left eval  Hip flexion 3 3  Hip extension 2 2  Hip abduction 3 3  Hip adduction    Hip internal rotation    Hip external rotation 3 3  Knee flexion 4 4  Knee extension 4 4  Ankle dorsiflexion    Ankle plantarflexion    Ankle inversion    Ankle eversion     (Blank rows = not tested)   FUNCTIONAL TESTS:  5 times sit to stand: 29.5  c use of hands 2 minute walk test: TBA  03/23/24: 193 feet with SPC, pt with increased SOB due to exertion  GAIT: Distance walked: 100' Assistive device utilized: Environmental consultant - 4 wheeled Level of assistance: Modified independence Comments: Decreased pace and step length                                                                                                                                TREATMENT DATE:  [facility] Adult PT Treatment:                                                DATE: 04/19/24 Therapeutic Exercise: Runners stretch rocking x 5 each  LAQ 3# 10 x 2 each  March 3# x 20 HL Blue band 2 x 20  Side hip abdct x 10 each  Bridge with ball  10 x 2  H/s curls with feet on ball x 10   Therapeutic Activity: Nustep L5 UE/LE x 10 minutes  Standing hip abdct x 10 each  Side stepping at counter dwb x 2     [facility] Adult PT Treatment:                                                 DATE: 04/14/24 Therapeutic Exercise: LAQ 3# 10 x 2 each  March 3# x 20 HL Blue band 2 x 20  Side hip abdct x 10 each  Bridge with ball 10 x 2  Manual Therapy: KT tape for comfort with walking applied to the right knee, 2 Y strips along the outside borders of the patella  Therapeutic Activity: Nustep L5 UE/LE x 9 minutes  2 MWT 206 feet with [facility]    [facility] Adult PT Treatment:                                                DATE: 04/06/24 Therapeutic Exercise: LAQ 10 x 2 each  SLR c QS x 10 each  HL ball squeeze 10 x 2  HL clam 10 x 2 GTB Hip abdct x 10 each  Mini Bridge  x 10 c swiss ball Therapeutic Activity: Nustep L4 UE/LE x 8 minutes  STS x 5  Manual Therapy: KT tape for comfort with walking applied to the right knee, 2 Y strips along the outside borders of the patella  [facility] Adult PT Treatment:                                                DATE: 03/31/24 Therapeutic Exercise: LAQ 10 x 2 each  SLR x 10 each 1 HL ball squeeze 10 x 2  SL clam 10 x 2  Hip abdct x 10 each  Mini Bridge 10 x 2   Therapeutic Activity: Gait 185 feet in 2 minutes with SPC, SPO2 93%  Nustep L4 UE/LE x 10 minutes  STS x 5  PATIENT EDUCATION:  Education details: Eval findings, POC, HEP, self care  Person educated: Patient Education method: Explanation, Demonstration, Tactile cues, Verbal cues, and Handouts Education comprehension: verbalized understanding, returned demonstration, verbal cues required, and tactile cues required  HOME EXERCISE PROGRAM: Access Code: [email or web address] URL: [email or web address] Date: 03/18/2024 Prepared by: Dasie Daft  Exercises - Supine Quadricep Sets  - 7 x weekly - 2 sets - 10 reps - 5 sec hold - Active Straight Leg Raise with Quad Set  - 7 x weekly - 2 sets - 10 reps - Supine Heel Slide with Strap  - 7 x weekly - 2 sets - 10 reps - 5 sec hold - Seated Hamstring Stretch  - 7 x weekly - 2 reps - 30 sec hold -  Seated Long Arc Quad  - 7 x weekly - 3 sets - 15 reps - Sit to Stand with Counter Support  - 7 x weekly - 2 sets - 5 reps - Side Stepping with Resistance at Ankles and Counter Support  - 7 x weekly -  3 reps - red band hold - Standing Hip Abduction with Resistance at Ankles and Counter Support  - 7 x weekly - 2 sets - 10 reps - red band hold  ASSESSMENT:  CLINICAL IMPRESSION: PT was  completed for bilat knee/LE ROM and strengthening. Pt tolerated prescribed exs in PT today without adverse effects. Increased closed chain challenges with pt reporting fatigue and increased right knee pain. She continues to be limited by right knee more than left which she was referred for. Overall her closed chain activity tolerance remains limited. Discussed aquatics and pt is receptive to trying. Will add to POC interventions.   Pt will continue to benefit from skilled PT to address impairments for improved knee/LE function with less pain.   EVAL: Patient is a 73 y.o. female who was seen today for physical therapy evaluation and treatment for  Z96.652 (ICD-10-CM) - Total knee replacement status, left  S83.422A (ICD-10-CM) - Sprain of lateral collateral ligament of left knee, initial encounter   Pt presents with decreased LE strength, AROM, and chronic knee pain bilat L>R which is negatively impacting the pt's quality and tolerance for functional mobility. Pt will benefit from skilled PT 2w6 to address impairments to optimize leg function  and mobility with less pain.   OBJECTIVE IMPAIRMENTS: decreased activity tolerance, decreased balance, decreased endurance, decreased mobility, difficulty walking, decreased ROM, decreased strength, and increased edema, and pain.  PERSONAL FACTORS: Age, Fitness, Past/current experiences, Time since onset of injury/illness/exacerbation, and 1-2 comorbidities: high BMI, trunk postural changes are also affecting patient's functional outcome.   REHAB POTENTIAL: Good  CLINICAL  DECISION MAKING: Evolving/moderate complexity  EVALUATION COMPLEXITY: Moderate   GOALS:  SHORT TERM GOALS: Target date: 04/08/24 Pt will be Ind in an initial HEP  Baseline: started Goal status: MET  LONG TERM GOALS: Target date: 05/06/24  Pt will be Ind in a final HEP to maintain achieved  LOF  Baseline:  Goal status: INITIAL  2.  Increase pt's bilat knee and hip strengths by 1/2 muscle grade to improve pt's function with daily activities Baseline:  Goal status: INITIAL  3.  Increased pt's bilat knee flexion to 105d or greater to improve pt's to obtain sitting positions Baseline: L 100d, R 90d Goal status: INITIAL  4.  Improve 5xSTS by MCID of 5 and by MCID of 48ft as indication of improved functional mobility  Baseline: 5xSTS= 29.5, 193 with SPC Goal status: INITIAL  5.  Pt will report 50% or greater improvement in her L knee pain for improved function and QOL Baseline: 4-8/10 Goal status: INITIAL  6.  Pt's LEFS score will improve by the the MCID to 49% or greater as indication of improved function  Baseline: 39% Goal status: INITIAL    PLAN:  PT FREQUENCY: 2x/week  PT DURATION: 6 weeks  PLANNED INTERVENTIONS: 97164- PT Re-evaluation, 97110-Therapeutic exercises, 97530- Therapeutic activity, 97112- Neuromuscular re-education, 97535- Self Care, [id number]- Manual therapy, 97116- Gait training, [phone]- Vasopneumatic device, Patient/Family education, Balance training, Stair training, Taping, Joint mobilization, Cryotherapy, and Moist heat  PLAN FOR NEXT SESSION: Update Cert to include AQUATICS: assess response to HEP; progress therex as indicated; use of modalities, and manual therapy.   Harlene Persons, PTA 04/19/24 3:40 PM Phone: [phone] Fax: [phone]

## 2024-04-20 NOTE — Therapy (Addendum)
 OUTPATIENT PHYSICAL THERAPY LOWER EXTREMITY TREATMENT   Patient Name: Alison Whitehead MRN: [id number] DOB:[birth year], 73 y.o., female Today's Date: 04/21/2024  Progress Note Reporting Period 03/18/24 to 04/21/24  See note below for Objective Data and Assessment of Progress/Goals.      END OF SESSION:  PT End of Session - 04/21/24 1137     Visit Number 9    Number of Visits 13    Date for Recertification  05/06/24    Authorization Type [employer] MEDICARE HMO/PPO    PT Start Time 1104    PT Stop Time 1145    PT Time Calculation (min) 41 min             Past Medical History:  Diagnosis Date   Allergy    seasonal   Arthritis    CKD (chronic kidney disease)    DDD (degenerative disc disease)    Degenerative disc disease, lumbar    Hypertension    IBS (irritable bowel syndrome)    Osteopenia    Osteoporosis    osteopenia   Stroke (HCC) 09/2019   Past Surgical History:  Procedure Laterality Date   SHOULDER SURGERY Left    lipoma   TOTAL KNEE ARTHROPLASTY Left 12/26/2022   Procedure: LEFT TOTAL KNEE ARTHROPLASTY;  Surgeon: Harden Jerona GAILS, MD;  Location: [facility] OR;  Service: Orthopedics;  Laterality: Left;   Patient Active Problem List   Diagnosis Date Noted   Total knee replacement status, 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   Rotator cuff syndrome, right 01/11/2020   Labile blood glucose    Vascular headache    Small vessel disease, cerebrovascular    Prediabetes    AKI (acute kidney injury) (HCC)    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   Acute cerebrovascular accident (CVA) (HCC) 09/30/2019   Unilateral primary osteoarthritis, left knee 03/17/2019   Unilateral primary osteoarthritis, right knee 03/17/2019   Bilateral primary osteoarthritis of knee 03/10/2019   INSOMNIA, CHRONIC 05/19/2007   SPONDYLOSIS, LUMBAR 05/19/2007   Essential hypertension 05/03/2007   ARTHRITIS, KNEE 05/03/2007    PCP: Bridgette Sluder, PA  REFERRING PROVIDER: Valdemar Rocky SAUNDERS, NP  REFERRING DIAG:   [phone] (ICD-10-CM) - Total knee replacement status, left  D16.577J (ICD-10-CM) - Sprain of lateral collateral ligament of left knee, initial encounter    THERAPY DIAG:  Chronic pain of left knee  Muscle weakness (generalized)  Difficulty in walking, not elsewhere classified  Rationale for Evaluation and Treatment: Rehabilitation  ONSET DATE: 12/26/22 A long time  SUBJECTIVE:   SUBJECTIVE STATEMENT: Pt reports her R knee continues to bother her.   EVAL: Pt reports a chronic Hx of knee pain L>R with a TKA of the L completed 5/24. She notes her knees bother her when walking with them starting to be more painful and swollen the past few months. She also endorses low back pain. She feels like thet are now less swollen.   PERTINENT HISTORY: See above. High BMI.  PAIN:  Are you having pain? Yes: NPRS scale: 5/10   (8/10 right knee, 8/10 left side starting Monday morning)  Pain location: L knee, both sides of the knee Pain description: ache, sharp Aggravating factors: Increased time on feey Relieving factors: Lidocaine  patches  PRECAUTIONS: None  RED FLAGS: None   WEIGHT BEARING RESTRICTIONS: No  FALLS:  Has patient fallen in last 6 months? No  LIVING ENVIRONMENT: Lives with: lives alone Lives in: House/apartment Stairs: No Has following equipment at home: Single point cane and Environmental consultant - 4 wheeled  OCCUPATION: retired  PLOF: Independent with household mobility with device and Independent with community mobility with device  PATIENT GOALS: To walk better s rollator if possible and with less pain  NEXT MD VISIT: Not scheduled  OBJECTIVE:  Note: Objective measures were completed at Evaluation unless otherwise noted.  DIAGNOSTIC FINDINGS:  03/02/24 X-ray L Knee Radiographs of the left knee show stable alignment of total knee  arthroplasty hardware there is no complicating features.  There is  end-stage osteoarthritis present to the right knee.  With bone-on-bone   contact.   PATIENT SURVEYS:  LEFS: 31/80=39%   COGNITION: Overall cognitive status: Within functional limits for tasks assessed     SENSATION: WFL  EDEMA:  Swelling present  MUSCLE LENGTH: Hamstrings: Right: Tight deg; Left tight deg Thomas test: Right Tight deg; Left Tight deg  POSTURE: increased lumbar lordosis and flexed trunk   PALPATION: TTP to the peri- L knee  LOWER EXTREMITY ROM:  Active ROM Right eval Left eval Right 03/31/24 Left 03/31/24  Hip flexion      Hip extension      Hip abduction      Hip adduction      Hip internal rotation      Hip external rotation      Knee flexion 90 100 110 110  Knee extension 15 lacking 15 lacking    Ankle dorsiflexion      Ankle plantarflexion      Ankle inversion      Ankle eversion       (Blank rows = not tested)  LOWER EXTREMITY MMT:  MMT Right eval Left eval Rt 04/21/24 Lt 04/21/24  Hip flexion 3 3 3  pain 3 pain  Hip extension 2 2    Hip abduction 3 3 4 4   Hip adduction      Hip internal rotation      Hip external rotation 3 3 4 4   Knee flexion 4 4 4+ 4+  Knee extension 4 4 4+ 4+  Ankle dorsiflexion      Ankle plantarflexion      Ankle inversion      Ankle eversion       (Blank rows = not tested)   FUNCTIONAL TESTS:  5 times sit to stand: 29.5  c use of hands 2 minute walk test: TBA  03/23/24: 193 feet with SPC, pt with increased SOB due to exertion  GAIT: Distance walked: 100' Assistive device utilized: Environmental consultant - 4 wheeled Level of assistance: Modified independence Comments: Decreased pace and step length                                                                                                                    TREATMENT DATE:  [facility] Adult PT Treatment:  DATE: 04/21/24 Therapeutic Exercise: SLR 2x10 each Runners stretch rocking x 5 each  LAQ 3# 10 x 2 each  March 3# x 20 H/s curls with feet on ball x 10  Therapeutic Activity: Nustep L5  UE/LE x 5 minutes  Standing hip abdct  2 x 10 each 3# 5xsts x 2 elevated mat table to 22 c use of hands  [facility] Adult PT Treatment:                                                DATE: 04/19/24 Therapeutic Exercise: Runners stretch rocking x 5 each  LAQ 3# 10 x 2 each  March 3# x 20 HL Blue band 2 x 20  Side hip abdct x 10 each  Bridge with ball 10 x 2  H/s curls with feet on ball x 10   Therapeutic Activity: Nustep L5 UE/LE x 10 minutes  Standing hip abdct x 10 each  Side stepping at counter dwb x 2   [facility] Adult PT Treatment:                                                DATE: 04/14/24 Therapeutic Exercise: LAQ 3# 10 x 2 each  March 3# x 20 HL Blue band 2 x 20  Side hip abdct x 10 each  Bridge with ball 10 x 2  Manual Therapy: KT tape for comfort with walking applied to the right knee, 2 Y strips along the outside borders of the patella Therapeutic Activity: Nustep L5 UE/LE x 9 minutes  2 MWT 206 feet with SPC  PATIENT EDUCATION:  Education details: Eval findings, POC, HEP, self care  Person educated: Patient Education method: Explanation, Demonstration, Tactile cues, Verbal cues, and Handouts Education comprehension: verbalized understanding, returned demonstration, verbal cues required, and tactile cues required  HOME EXERCISE PROGRAM: Access Code: [email or web address] URL: [email or web address] Date: 03/18/2024 Prepared by: Dasie Daft  Exercises - Supine Quadricep Sets  - 7 x weekly - 2 sets - 10 reps - 5 sec hold - Active Straight Leg Raise with Quad Set  - 7 x weekly - 2 sets - 10 reps - Supine Heel Slide with Strap  - 7 x weekly - 2 sets - 10 reps - 5 sec hold - Seated Hamstring Stretch  - 7 x weekly - 2 reps - 30 sec hold - Seated Long Arc Quad  - 7 x weekly - 3 sets - 15 reps - Sit to Stand with Counter Support  - 7 x weekly - 2 sets - 5 reps - Side Stepping with Resistance at Ankles and Counter Support  - 7 x weekly - 3 reps - red band hold - Standing  Hip Abduction with Resistance at Ankles and Counter Support  - 7 x weekly - 2 sets - 10 reps - red band hold  ASSESSMENT:  CLINICAL IMPRESSION: Completed reassessment for ROM and strength. Knee flexion was increased meeting this goal. Pt's lateral hip strength demonstrated improved strength, while hip flexion remained the same limited by pain. While objective measures are improving, pt continues present walking with her R knee extended due to pain. R knee pain continues to limit functional mobility.  EVAL: Patient is a 73 y.o.  female who was seen today for physical therapy evaluation and treatment for  Z96.652 (ICD-10-CM) - Total knee replacement status, left  S83.422A (ICD-10-CM) - Sprain of lateral collateral ligament of left knee, initial encounter   Pt presents with decreased LE strength, AROM, and chronic knee pain bilat L>R which is negatively impacting the pt's quality and tolerance for functional mobility. Pt will benefit from skilled PT 2w6 to address impairments to optimize leg function  and mobility with less pain.   OBJECTIVE IMPAIRMENTS: decreased activity tolerance, decreased balance, decreased endurance, decreased mobility, difficulty walking, decreased ROM, decreased strength, and increased edema, and pain.  PERSONAL FACTORS: Age, Fitness, Past/current experiences, Time since onset of injury/illness/exacerbation, and 1-2 comorbidities: high BMI, trunk postural changes are also affecting patient's functional outcome.   REHAB POTENTIAL: Good  CLINICAL DECISION MAKING: Evolving/moderate complexity  EVALUATION COMPLEXITY: Moderate   GOALS:  SHORT TERM GOALS: Target date: 04/08/24 Pt will be Ind in an initial HEP  Baseline: started Goal status: MET  LONG TERM GOALS: Target date: 05/06/24  Pt will be Ind in a final HEP to maintain achieved  LOF  Baseline:  Goal status: INITIAL  2.  Increase pt's bilat knee and hip strengths by 1/2 muscle grade to improve pt's function with  daily activities Baseline:  04/21/24: see flow sheets Goal status: IMPROVING  3.  Increased pt's bilat knee flexion to 105d or greater to improve pt's to obtain sitting positions Baseline: L 100d, R 90d 04/21/24: L 105; R 105 Goal status: MET  4.  Improve 5xSTS by MCID of 5 and by MCID of 69ft as indication of improved functional mobility  Baseline: 5xSTS= 29.5, 193 with SPC Goal status: INITIAL  5.  Pt will report 50% or greater improvement in her L knee pain for improved function and QOL Baseline: 4-8/10 Goal status: INITIAL  6.  Pt's LEFS score will improve by the the MCID to 49% or greater as indication of improved function  Baseline: 39% Goal status: INITIAL    PLAN:  PT FREQUENCY: 2x/week  PT DURATION: 6 weeks  PLANNED INTERVENTIONS: 97164- PT Re-evaluation, 97110-Therapeutic exercises, 97530- Therapeutic activity, 97112- Neuromuscular re-education, 97535- Self Care, 02859- Manual therapy, 97116- Gait training, [phone]- Vasopneumatic device, Patient/Family education, Balance training, Stair training, Taping, Joint mobilization, Cryotherapy, and Moist heat  PLAN FOR NEXT SESSION: Update Cert to include AQUATICS: assess response to HEP; progress therex as indicated; use of modalities, and manual therapy.   Minami Arriaga MS, PT 04/21/24 1:28 PM

## 2024-04-21 ENCOUNTER — Ambulatory Visit: Payer: Self-pay

## 2024-04-21 DIAGNOSIS — R262 Difficulty in walking, not elsewhere classified: Secondary | ICD-10-CM

## 2024-04-21 DIAGNOSIS — M6281 Muscle weakness (generalized): Secondary | ICD-10-CM

## 2024-04-21 DIAGNOSIS — G8929 Other chronic pain: Secondary | ICD-10-CM

## 2024-04-21 DIAGNOSIS — M25562 Pain in left knee: Secondary | ICD-10-CM | POA: Diagnosis not present

## 2024-04-27 ENCOUNTER — Encounter: Payer: Self-pay | Admitting: Physical Therapy

## 2024-04-27 ENCOUNTER — Ambulatory Visit: Admitting: Physical Therapy

## 2024-04-27 DIAGNOSIS — M25562 Pain in left knee: Secondary | ICD-10-CM | POA: Diagnosis not present

## 2024-04-27 DIAGNOSIS — G8929 Other chronic pain: Secondary | ICD-10-CM

## 2024-04-27 DIAGNOSIS — M6281 Muscle weakness (generalized): Secondary | ICD-10-CM

## 2024-04-27 NOTE — Therapy (Signed)
 OUTPATIENT PHYSICAL THERAPY LOWER EXTREMITY TREATMENT   Patient Name: Alison Whitehead MRN: [id number] DOB:Dec 26, 1950, 73 y.o., female Today's Date: 04/27/2024  END OF SESSION:  PT End of Session - 04/27/24 1346     Visit Number 10    Number of Visits 13    Date for Recertification  05/06/24    Authorization Type [employer] MEDICARE HMO/PPO    PT Start Time 1345    PT Stop Time 1425    PT Time Calculation (min) 40 min             Past Medical History:  Diagnosis Date   Allergy    seasonal   Arthritis    CKD (chronic kidney disease)    DDD (degenerative disc disease)    Degenerative disc disease, lumbar    Hypertension    IBS (irritable bowel syndrome)    Osteopenia    Osteoporosis    osteopenia   Stroke (HCC) 09/2019   Past Surgical History:  Procedure Laterality Date   SHOULDER SURGERY Left    lipoma   TOTAL KNEE ARTHROPLASTY Left 12/26/2022   Procedure: LEFT TOTAL KNEE ARTHROPLASTY;  Surgeon: Harden Jerona GAILS, MD;  Location: [facility] OR;  Service: Orthopedics;  Laterality: Left;   Patient Active Problem List   Diagnosis Date Noted   Total knee replacement status, 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   Rotator cuff syndrome, right 01/11/2020   Labile blood glucose    Vascular headache    Small vessel disease, cerebrovascular    Prediabetes    AKI (acute kidney injury)    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   Acute cerebrovascular accident (CVA) (HCC) 09/30/2019   Unilateral primary osteoarthritis, left knee 03/17/2019   Unilateral primary osteoarthritis, right knee 03/17/2019   Bilateral primary osteoarthritis of knee 03/10/2019   INSOMNIA, CHRONIC 05/19/2007   SPONDYLOSIS, LUMBAR 05/19/2007   Essential hypertension 05/03/2007   ARTHRITIS, KNEE 05/03/2007    PCP: Bridgette Sluder, PA  REFERRING PROVIDER: Valdemar Rocky SAUNDERS, NP  REFERRING DIAG:  [phone] (ICD-10-CM) - Total knee replacement status, left  D16.577J (ICD-10-CM) - Sprain of lateral collateral ligament of left knee,  initial encounter    THERAPY DIAG:  Chronic pain of left knee  Muscle weakness (generalized)  Rationale for Evaluation and Treatment: Rehabilitation  ONSET DATE: 12/26/22 A long time  SUBJECTIVE:   SUBJECTIVE STATEMENT: Pt reports her R knee continues to bother her as well as her back. Today is a bad day for the back.   EVAL: Pt reports a chronic Hx of knee pain L>R with a TKA of the L completed 5/24. She notes her knees bother her when walking with them starting to be more painful and swollen the past few months. She also endorses low back pain. She feels like thet are now less swollen.   PERTINENT HISTORY: See above. High BMI.  PAIN:  Are you having pain? Yes: NPRS scale: 5/10   (8/10 right knee, 8/10 left side starting Monday morning)  Pain location: L knee, both sides of the knee Pain description: ache, sharp Aggravating factors: Increased time on feey Relieving factors: Lidocaine  patches  PRECAUTIONS: None  RED FLAGS: None   WEIGHT BEARING RESTRICTIONS: No  FALLS:  Has patient fallen in last 6 months? No  LIVING ENVIRONMENT: Lives with: lives alone Lives in: House/apartment Stairs: No Has following equipment at home: Single point cane and Environmental consultant - 4 wheeled  OCCUPATION: retired  PLOF: Independent with household mobility with device and Independent with community mobility with device  PATIENT GOALS: To walk better s rollator if possible and with less pain  NEXT MD VISIT: Not scheduled  OBJECTIVE:  Note: Objective measures were completed at Evaluation unless otherwise noted.  DIAGNOSTIC FINDINGS:  03/02/24 X-ray L Knee Radiographs of the left knee show stable alignment of total knee  arthroplasty hardware there is no complicating features.  There is  end-stage osteoarthritis present to the right knee.  With bone-on-bone  contact.   PATIENT SURVEYS:  LEFS: 31/80=39%   COGNITION: Overall cognitive status: Within functional limits for tasks  assessed     SENSATION: WFL  EDEMA:  Swelling present  MUSCLE LENGTH: Hamstrings: Right: Tight deg; Left tight deg Thomas test: Right Tight deg; Left Tight deg  POSTURE: increased lumbar lordosis and flexed trunk   PALPATION: TTP to the peri- L knee  LOWER EXTREMITY ROM:  Active ROM Right eval Left eval Right 03/31/24 Left 03/31/24  Hip flexion      Hip extension      Hip abduction      Hip adduction      Hip internal rotation      Hip external rotation      Knee flexion 90 100 110 110  Knee extension 15 lacking 15 lacking    Ankle dorsiflexion      Ankle plantarflexion      Ankle inversion      Ankle eversion       (Blank rows = not tested)  LOWER EXTREMITY MMT:  MMT Right eval Left eval Rt 04/21/24 Lt 04/21/24  Hip flexion 3 3 3  pain 3 pain  Hip extension 2 2    Hip abduction 3 3 4 4   Hip adduction      Hip internal rotation      Hip external rotation 3 3 4 4   Knee flexion 4 4 4+ 4+  Knee extension 4 4 4+ 4+  Ankle dorsiflexion      Ankle plantarflexion      Ankle inversion      Ankle eversion       (Blank rows = not tested)   FUNCTIONAL TESTS:  5 times sit to stand: 29.5  c use of hands 2 minute walk test: TBA  03/23/24: 193 feet with SPC, pt with increased SOB due to exertion  GAIT: Distance walked: 100' Assistive device utilized: Environmental consultant - 4 wheeled Level of assistance: Modified independence Comments: Decreased pace and step length                                                                                                                    TREATMENT DATE:  [facility] Adult PT Treatment:                                                DATE: 04/27/24 Therapeutic Exercise: Nustep 3# march  3# LAQ PPT H/s curls with  feet on ball SLR 10 x 2 each  Hip abduction x 10 each  Manual Therapy: KT tape for posterior knee pain, 2 long strips , one lateral, one medial    [facility] Adult PT Treatment:                                                DATE:  04/21/24 Therapeutic Exercise: SLR 2x10 each Runners stretch rocking x 5 each  LAQ 3# 10 x 2 each  March 3# x 20 H/s curls with feet on ball x 10  Therapeutic Activity: Nustep L5 UE/LE x 5 minutes  Standing hip abdct  2 x 10 each 3# 5xsts x 2 elevated mat table to 22 c use of hands  [facility] Adult PT Treatment:                                                DATE: 04/19/24 Therapeutic Exercise: Runners stretch rocking x 5 each  LAQ 3# 10 x 2 each  March 3# x 20 HL Blue band 2 x 20  Side hip abdct x 10 each  Bridge with ball 10 x 2  H/s curls with feet on ball x 10   Therapeutic Activity: Nustep L5 UE/LE x 10 minutes  Standing hip abdct x 10 each  Side stepping at counter dwb x 2   [facility] Adult PT Treatment:                                                DATE: 04/14/24 Therapeutic Exercise: LAQ 3# 10 x 2 each  March 3# x 20 HL Blue band 2 x 20  Side hip abdct x 10 each  Bridge with ball 10 x 2  Manual Therapy: KT tape for comfort with walking applied to the right knee, 2 Y strips along the outside borders of the patella Therapeutic Activity: Nustep L5 UE/LE x 9 minutes  2 MWT 206 feet with SPC  PATIENT EDUCATION:  Education details: Eval findings, POC, HEP, self care  Person educated: Patient Education method: Explanation, Demonstration, Tactile cues, Verbal cues, and Handouts Education comprehension: verbalized understanding, returned demonstration, verbal cues required, and tactile cues required  HOME EXERCISE PROGRAM: Access Code: [email or web address] URL: [email or web address] Date: 03/18/2024 Prepared by: Dasie Daft  Exercises - Supine Quadricep Sets  - 7 x weekly - 2 sets - 10 reps - 5 sec hold - Active Straight Leg Raise with Quad Set  - 7 x weekly - 2 sets - 10 reps - Supine Heel Slide with Strap  - 7 x weekly - 2 sets - 10 reps - 5 sec hold - Seated Hamstring Stretch  - 7 x weekly - 2 reps - 30 sec hold - Seated Long Arc Quad  - 7 x weekly - 3 sets - 15  reps - Sit to Stand with Counter Support  - 7 x weekly - 2 sets - 5 reps - Side Stepping with Resistance at Ankles and Counter Support  - 7 x weekly - 3 reps - red band hold - Standing Hip Abduction with Resistance at  Ankles and Counter Support  - 7 x weekly - 2 sets - 10 reps - red band hold  ASSESSMENT:  CLINICAL IMPRESSION: Continued working on LE strengthening. Pt limited by right knee pain and low back pain. Limited tolerance to standing today due to LBP.   EVAL: Patient is a 73 y.o. female who was seen today for physical therapy evaluation and treatment for  Z96.652 (ICD-10-CM) - Total knee replacement status, left  S83.422A (ICD-10-CM) - Sprain of lateral collateral ligament of left knee, initial encounter   Pt presents with decreased LE strength, AROM, and chronic knee pain bilat L>R which is negatively impacting the pt's quality and tolerance for functional mobility. Pt will benefit from skilled PT 2w6 to address impairments to optimize leg function  and mobility with less pain.   OBJECTIVE IMPAIRMENTS: decreased activity tolerance, decreased balance, decreased endurance, decreased mobility, difficulty walking, decreased ROM, decreased strength, and increased edema, and pain.  PERSONAL FACTORS: Age, Fitness, Past/current experiences, Time since onset of injury/illness/exacerbation, and 1-2 comorbidities: high BMI, trunk postural changes are also affecting patient's functional outcome.   REHAB POTENTIAL: Good  CLINICAL DECISION MAKING: Evolving/moderate complexity  EVALUATION COMPLEXITY: Moderate   GOALS:  SHORT TERM GOALS: Target date: 04/08/24 Pt will be Ind in an initial HEP  Baseline: started Goal status: MET  LONG TERM GOALS: Target date: 05/06/24  Pt will be Ind in a final HEP to maintain achieved  LOF  Baseline:  Goal status: INITIAL  2.  Increase pt's bilat knee and hip strengths by 1/2 muscle grade to improve pt's function with daily activities Baseline:   04/21/24: see flow sheets Goal status: IMPROVING  3.  Increased pt's bilat knee flexion to 105d or greater to improve pt's to obtain sitting positions Baseline: L 100d, R 90d 04/21/24: L 105; R 105 Goal status: MET  4.  Improve 5xSTS by MCID of 5 and by MCID of 79ft as indication of improved functional mobility  Baseline: 5xSTS= 29.5, 193 with SPC Goal status: INITIAL  5.  Pt will report 50% or greater improvement in her L knee pain for improved function and QOL Baseline: 4-8/10 04/27/24:  Goal status: INITIAL  6.  Pt's LEFS score will improve by the the MCID to 49% or greater as indication of improved function  Baseline: 39% Goal status: INITIAL    PLAN:  PT FREQUENCY: 2x/week  PT DURATION: 6 weeks  PLANNED INTERVENTIONS: 97164- PT Re-evaluation, 97110-Therapeutic exercises, 97530- Therapeutic activity, 97112- Neuromuscular re-education, 97535- Self Care, 02859- Manual therapy, 97116- Gait training, [phone]- Vasopneumatic device, Patient/Family education, Balance training, Stair training, Taping, Joint mobilization, Cryotherapy, and Moist heat  PLAN FOR NEXT SESSION: Update Cert to include AQUATICS: assess response to HEP; progress therex as indicated; use of modalities, and manual therapy.   Harlene Persons, PTA 04/27/24 2:40 PM Phone: [phone] Fax: [phone]

## 2024-04-28 ENCOUNTER — Ambulatory Visit: Admitting: Physical Therapy

## 2024-04-28 ENCOUNTER — Encounter: Payer: Self-pay | Admitting: Physical Therapy

## 2024-04-28 DIAGNOSIS — M6281 Muscle weakness (generalized): Secondary | ICD-10-CM

## 2024-04-28 DIAGNOSIS — G8929 Other chronic pain: Secondary | ICD-10-CM

## 2024-04-28 DIAGNOSIS — M25562 Pain in left knee: Secondary | ICD-10-CM | POA: Diagnosis not present

## 2024-04-28 NOTE — Therapy (Signed)
 OUTPATIENT PHYSICAL THERAPY LOWER EXTREMITY TREATMENT   Patient Name: Alison Whitehead MRN: [id number] DOB:[birth year], 73 y.o., female Today's Date: 04/28/2024  END OF SESSION:  PT End of Session - 04/28/24 1401     Visit Number 11    Number of Visits 13    Date for Recertification  05/06/24    Authorization Type [employer] MEDICARE HMO/PPO    PT Start Time 1400    PT Stop Time 1440    PT Time Calculation (min) 40 min             Past Medical History:  Diagnosis Date   Allergy    seasonal   Arthritis    CKD (chronic kidney disease)    DDD (degenerative disc disease)    Degenerative disc disease, lumbar    Hypertension    IBS (irritable bowel syndrome)    Osteopenia    Osteoporosis    osteopenia   Stroke (HCC) 09/2019   Past Surgical History:  Procedure Laterality Date   SHOULDER SURGERY Left    lipoma   TOTAL KNEE ARTHROPLASTY Left 12/26/2022   Procedure: LEFT TOTAL KNEE ARTHROPLASTY;  Surgeon: Harden Jerona GAILS, MD;  Location: [facility] OR;  Service: Orthopedics;  Laterality: Left;   Patient Active Problem List   Diagnosis Date Noted   Total knee replacement status, 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   Rotator cuff syndrome, right 01/11/2020   Labile blood glucose    Vascular headache    Small vessel disease, cerebrovascular    Prediabetes    AKI (acute kidney injury)    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   Acute cerebrovascular accident (CVA) (HCC) 09/30/2019   Unilateral primary osteoarthritis, left knee 03/17/2019   Unilateral primary osteoarthritis, right knee 03/17/2019   Bilateral primary osteoarthritis of knee 03/10/2019   INSOMNIA, CHRONIC 05/19/2007   SPONDYLOSIS, LUMBAR 05/19/2007   Essential hypertension 05/03/2007   ARTHRITIS, KNEE 05/03/2007    PCP: Bridgette Sluder, PA  REFERRING PROVIDER: Valdemar Rocky SAUNDERS, NP  REFERRING DIAG:  [phone] (ICD-10-CM) - Total knee replacement status, left  D16.577J (ICD-10-CM) - Sprain of lateral collateral ligament of left knee,  initial encounter    THERAPY DIAG:  Chronic pain of left knee  Muscle weakness (generalized)  Rationale for Evaluation and Treatment: Rehabilitation  ONSET DATE: 12/26/22 A long time  SUBJECTIVE:   SUBJECTIVE STATEMENT: Pt reports her R knee continues to bother her as well as her back. Today is a bad day for the back.   EVAL: Pt reports a chronic Hx of knee pain L>R with a TKA of the L completed 5/24. She notes her knees bother her when walking with them starting to be more painful and swollen the past few months. She also endorses low back pain. She feels like thet are now less swollen.   PERTINENT HISTORY: See above. High BMI.  PAIN:  Are you having pain? Yes: NPRS scale: 5/10   (8/10 right knee, 8/10 left side starting Monday morning)  Pain location: L knee, both sides of the knee Pain description: ache, sharp Aggravating factors: Increased time on feey Relieving factors: Lidocaine  patches  PRECAUTIONS: None  RED FLAGS: None   WEIGHT BEARING RESTRICTIONS: No  FALLS:  Has patient fallen in last 6 months? No  LIVING ENVIRONMENT: Lives with: lives alone Lives in: House/apartment Stairs: No Has following equipment at home: Single point cane and Environmental consultant - 4 wheeled  OCCUPATION: retired  PLOF: Independent with household mobility with device and Independent with community mobility with device  PATIENT GOALS: To walk better s rollator if possible and with less pain  NEXT MD VISIT: Not scheduled  OBJECTIVE:  Note: Objective measures were completed at Evaluation unless otherwise noted.  DIAGNOSTIC FINDINGS:  03/02/24 X-ray L Knee Radiographs of the left knee show stable alignment of total knee  arthroplasty hardware there is no complicating features.  There is  end-stage osteoarthritis present to the right knee.  With bone-on-bone  contact.   PATIENT SURVEYS:  LEFS: 31/80=39%   COGNITION: Overall cognitive status: Within functional limits for tasks  assessed     SENSATION: WFL  EDEMA:  Swelling present  MUSCLE LENGTH: Hamstrings: Right: Tight deg; Left tight deg Thomas test: Right Tight deg; Left Tight deg  POSTURE: increased lumbar lordosis and flexed trunk   PALPATION: TTP to the peri- L knee  LOWER EXTREMITY ROM:  Active ROM Right eval Left eval Right 03/31/24 Left 03/31/24  Hip flexion      Hip extension      Hip abduction      Hip adduction      Hip internal rotation      Hip external rotation      Knee flexion 90 100 110 110  Knee extension 15 lacking 15 lacking    Ankle dorsiflexion      Ankle plantarflexion      Ankle inversion      Ankle eversion       (Blank rows = not tested)  LOWER EXTREMITY MMT:  MMT Right eval Left eval Rt 04/21/24 Lt 04/21/24  Hip flexion 3 3 3  pain 3 pain  Hip extension 2 2    Hip abduction 3 3 4 4   Hip adduction      Hip internal rotation      Hip external rotation 3 3 4 4   Knee flexion 4 4 4+ 4+  Knee extension 4 4 4+ 4+  Ankle dorsiflexion      Ankle plantarflexion      Ankle inversion      Ankle eversion       (Blank rows = not tested)   FUNCTIONAL TESTS:  5 times sit to stand: 29.5  c use of hands 2 minute walk test: TBA  03/23/24: 193 feet with SPC, pt with increased SOB due to exertion  GAIT: Distance walked: 100' Assistive device utilized: Environmental consultant - 4 wheeled Level of assistance: Modified independence Comments: Decreased pace and step length                                                                                                                    TREATMENT DATE:  [facility] Adult PT Treatment:                                                DATE: 04/28/24 Therapeutic Exercise: Nustep x 6 min 3# march 3# LAQ HL blue  band clam  SLR 10 x 2 each  Hip abduction 10 x 2 each  STS x 10 Standing heel raise x 10 Supported squats x 10 Standing hip abduction 2 x 10 each      [facility] Adult PT Treatment:                                                DATE:  04/27/24 Therapeutic Exercise: Nustep 3# march  3# LAQ PPT H/s curls with feet on ball SLR 10 x 2 each  Hip abduction x 10 each  Manual Therapy: KT tape for posterior knee pain, 2 long strips , one lateral, one medial    [facility] Adult PT Treatment:                                                DATE: 04/21/24 Therapeutic Exercise: SLR 2x10 each Runners stretch rocking x 5 each  LAQ 3# 10 x 2 each  March 3# x 20 H/s curls with feet on ball x 10  Therapeutic Activity: Nustep L5 UE/LE x 5 minutes  Standing hip abdct  2 x 10 each 3# 5xsts x 2 elevated mat table to 22 c use of hands  [facility] Adult PT Treatment:                                                DATE: 04/19/24 Therapeutic Exercise: Runners stretch rocking x 5 each  LAQ 3# 10 x 2 each  March 3# x 20 HL Blue band 2 x 20  Side hip abdct x 10 each  Bridge with ball 10 x 2  H/s curls with feet on ball x 10   Therapeutic Activity: Nustep L5 UE/LE x 10 minutes  Standing hip abdct x 10 each  Side stepping at counter dwb x 2   [facility] Adult PT Treatment:                                                DATE: 04/14/24 Therapeutic Exercise: LAQ 3# 10 x 2 each  March 3# x 20 HL Blue band 2 x 20  Side hip abdct x 10 each  Bridge with ball 10 x 2  Manual Therapy: KT tape for comfort with walking applied to the right knee, 2 Y strips along the outside borders of the patella Therapeutic Activity: Nustep L5 UE/LE x 9 minutes  2 MWT 206 feet with SPC  PATIENT EDUCATION:  Education details: Eval findings, POC, HEP, self care  Person educated: Patient Education method: Explanation, Demonstration, Tactile cues, Verbal cues, and Handouts Education comprehension: verbalized understanding, returned demonstration, verbal cues required, and tactile cues required  HOME EXERCISE PROGRAM: Access Code: [email or web address] URL: [email or web address] Date: 03/18/2024 Prepared by: Dasie Daft  Exercises - Supine Quadricep Sets  - 7 x  weekly - 2 sets - 10 reps - 5 sec hold - Active Straight Leg Raise with Quad Set  - 7 x weekly -  2 sets - 10 reps - Supine Heel Slide with Strap  - 7 x weekly - 2 sets - 10 reps - 5 sec hold - Seated Hamstring Stretch  - 7 x weekly - 2 reps - 30 sec hold - Seated Long Arc Quad  - 7 x weekly - 3 sets - 15 reps - Sit to Stand with Counter Support  - 7 x weekly - 2 sets - 5 reps - Side Stepping with Resistance at Ankles and Counter Support  - 7 x weekly - 3 reps - red band hold - Standing Hip Abduction with Resistance at Ankles and Counter Support  - 7 x weekly - 2 sets - 10 reps - red band hold  ASSESSMENT:  CLINICAL IMPRESSION: Continued working on LE strengthening. KT tape helpful to posterior knee pain. Less back pain today. Able to tolerate standing exercises with rest breaks.   EVAL: Patient is a 72 y.o. female who was seen today for physical therapy evaluation and treatment for  Z96.652 (ICD-10-CM) - Total knee replacement status, left  S83.422A (ICD-10-CM) - Sprain of lateral collateral ligament of left knee, initial encounter   Pt presents with decreased LE strength, AROM, and chronic knee pain bilat L>R which is negatively impacting the pt's quality and tolerance for functional mobility. Pt will benefit from skilled PT 2w6 to address impairments to optimize leg function  and mobility with less pain.   OBJECTIVE IMPAIRMENTS: decreased activity tolerance, decreased balance, decreased endurance, decreased mobility, difficulty walking, decreased ROM, decreased strength, and increased edema, and pain.  PERSONAL FACTORS: Age, Fitness, Past/current experiences, Time since onset of injury/illness/exacerbation, and 1-2 comorbidities: high BMI, trunk postural changes are also affecting patient's functional outcome.   REHAB POTENTIAL: Good  CLINICAL DECISION MAKING: Evolving/moderate complexity  EVALUATION COMPLEXITY: Moderate   GOALS:  SHORT TERM GOALS: Target date: 04/08/24 Pt will be  Ind in an initial HEP  Baseline: started Goal status: MET  LONG TERM GOALS: Target date: 05/06/24  Pt will be Ind in a final HEP to maintain achieved  LOF  Baseline:  Goal status: INITIAL  2.  Increase pt's bilat knee and hip strengths by 1/2 muscle grade to improve pt's function with daily activities Baseline:  04/21/24: see flow sheets Goal status: IMPROVING  3.  Increased pt's bilat knee flexion to 105d or greater to improve pt's to obtain sitting positions Baseline: L 100d, R 90d 04/21/24: L 105; R 105 Goal status: MET  4.  Improve 5xSTS by MCID of 5 and by MCID of 32ft as indication of improved functional mobility  Baseline: 5xSTS= 29.5, 193 with SPC Goal status: INITIAL  5.  Pt will report 50% or greater improvement in her L knee pain for improved function and QOL Baseline: 4-8/10 04/27/24:  Goal status: INITIAL  6.  Pt's LEFS score will improve by the the MCID to 49% or greater as indication of improved function  Baseline: 39% Goal status: INITIAL    PLAN:  PT FREQUENCY: 2x/week  PT DURATION: 6 weeks  PLANNED INTERVENTIONS: 97164- PT Re-evaluation, 97110-Therapeutic exercises, 97530- Therapeutic activity, 97112- Neuromuscular re-education, 97535- Self Care, 02859- Manual therapy, 97116- Gait training, [phone]- Vasopneumatic device, Patient/Family education, Balance training, Stair training, Taping, Joint mobilization, Cryotherapy, and Moist heat  PLAN FOR NEXT SESSION: Update Cert to include AQUATICS: assess response to HEP; progress therex as indicated; use of modalities, and manual therapy.   Harlene Persons, PTA 04/28/24 2:47 PM Phone: [phone] Fax: [phone]

## 2024-05-03 ENCOUNTER — Encounter: Payer: Self-pay | Admitting: Physical Therapy

## 2024-05-03 ENCOUNTER — Ambulatory Visit: Admitting: Physical Therapy

## 2024-05-03 DIAGNOSIS — G8929 Other chronic pain: Secondary | ICD-10-CM

## 2024-05-03 DIAGNOSIS — M6281 Muscle weakness (generalized): Secondary | ICD-10-CM

## 2024-05-03 DIAGNOSIS — M25562 Pain in left knee: Secondary | ICD-10-CM | POA: Diagnosis not present

## 2024-05-03 NOTE — Therapy (Signed)
 OUTPATIENT PHYSICAL THERAPY LOWER EXTREMITY TREATMENT   Patient Name: Alison Whitehead MRN: [id number] DOB:02/22/1951, 73 y.o., female Today's Date: 05/03/2024  END OF SESSION:  PT End of Session - 05/03/24 1118     Visit Number 12    Number of Visits 13    Date for Recertification  05/06/24    Authorization Type [employer] MEDICARE HMO/PPO    PT Start Time 1102    PT Stop Time 1145    PT Time Calculation (min) 43 min             Past Medical History:  Diagnosis Date   Allergy    seasonal   Arthritis    CKD (chronic kidney disease)    DDD (degenerative disc disease)    Degenerative disc disease, lumbar    Hypertension    IBS (irritable bowel syndrome)    Osteopenia    Osteoporosis    osteopenia   Stroke (HCC) 09/2019   Past Surgical History:  Procedure Laterality Date   SHOULDER SURGERY Left    lipoma   TOTAL KNEE ARTHROPLASTY Left 12/26/2022   Procedure: LEFT TOTAL KNEE ARTHROPLASTY;  Surgeon: Harden Jerona GAILS, MD;  Location: [facility] OR;  Service: Orthopedics;  Laterality: Left;   Patient Active Problem List   Diagnosis Date Noted   Total knee replacement status, 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   Rotator cuff syndrome, right 01/11/2020   Labile blood glucose    Vascular headache    Small vessel disease, cerebrovascular    Prediabetes    AKI (acute kidney injury)    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   Acute cerebrovascular accident (CVA) (HCC) 09/30/2019   Unilateral primary osteoarthritis, left knee 03/17/2019   Unilateral primary osteoarthritis, right knee 03/17/2019   Bilateral primary osteoarthritis of knee 03/10/2019   INSOMNIA, CHRONIC 05/19/2007   SPONDYLOSIS, LUMBAR 05/19/2007   Essential hypertension 05/03/2007   ARTHRITIS, KNEE 05/03/2007    PCP: Bridgette Sluder, PA  REFERRING PROVIDER: Valdemar Rocky SAUNDERS, NP  REFERRING DIAG:  [phone] (ICD-10-CM) - Total knee replacement status, left  D16.577J (ICD-10-CM) - Sprain of lateral collateral ligament of left knee,  initial encounter    THERAPY DIAG:  Chronic pain of left knee  Muscle weakness (generalized)  Rationale for Evaluation and Treatment: Rehabilitation  ONSET DATE: 12/26/22 A long time  SUBJECTIVE:   SUBJECTIVE STATEMENT: Pt reports her R knee continues to bother her as well as her back. This is a better day than last visit.   EVAL: Pt reports a chronic Hx of knee pain L>R with a TKA of the L completed 5/24. She notes her knees bother her when walking with them starting to be more painful and swollen the past few months. She also endorses low back pain. She feels like thet are now less swollen.   PERTINENT HISTORY: See above. High BMI.  PAIN:  Are you having pain? Yes: NPRS scale: 5/10   (8/10 right knee, 8/10 left side starting Monday morning)  Pain location: L knee, both sides of the knee Pain description: ache, sharp Aggravating factors: Increased time on feey Relieving factors: Lidocaine  patches  PRECAUTIONS: None  RED FLAGS: None   WEIGHT BEARING RESTRICTIONS: No  FALLS:  Has patient fallen in last 6 months? No  LIVING ENVIRONMENT: Lives with: lives alone Lives in: House/apartment Stairs: No Has following equipment at home: Single point cane and Environmental consultant - 4 wheeled  OCCUPATION: retired  PLOF: Independent with household mobility with device and Independent with community mobility with device  PATIENT GOALS: To walk better s rollator if possible and with less pain  NEXT MD VISIT: Not scheduled  OBJECTIVE:  Note: Objective measures were completed at Evaluation unless otherwise noted.  DIAGNOSTIC FINDINGS:  03/02/24 X-ray L Knee Radiographs of the left knee show stable alignment of total knee  arthroplasty hardware there is no complicating features.  There is  end-stage osteoarthritis present to the right knee.  With bone-on-bone  contact.   PATIENT SURVEYS:  LEFS: 31/80=39%   COGNITION: Overall cognitive status: Within functional limits for tasks  assessed     SENSATION: WFL  EDEMA:  Swelling present  MUSCLE LENGTH: Hamstrings: Right: Tight deg; Left tight deg Thomas test: Right Tight deg; Left Tight deg  POSTURE: increased lumbar lordosis and flexed trunk   PALPATION: TTP to the peri- L knee  LOWER EXTREMITY ROM:  Active ROM Right eval Left eval Right 03/31/24 Left 03/31/24  Hip flexion      Hip extension      Hip abduction      Hip adduction      Hip internal rotation      Hip external rotation      Knee flexion 90 100 110 110  Knee extension 15 lacking 15 lacking    Ankle dorsiflexion      Ankle plantarflexion      Ankle inversion      Ankle eversion       (Blank rows = not tested)  LOWER EXTREMITY MMT:  MMT Right eval Left eval Rt 04/21/24 Lt 04/21/24  Hip flexion 3 3 3  pain 3 pain  Hip extension 2 2    Hip abduction 3 3 4 4   Hip adduction      Hip internal rotation      Hip external rotation 3 3 4 4   Knee flexion 4 4 4+ 4+  Knee extension 4 4 4+ 4+  Ankle dorsiflexion      Ankle plantarflexion      Ankle inversion      Ankle eversion       (Blank rows = not tested)   FUNCTIONAL TESTS:  5 times sit to stand: 29.5  c use of hands 2 minute walk test: TBA  03/23/24: 193 feet with SPC, pt with increased SOB due to exertion  GAIT: Distance walked: 100' Assistive device utilized: Environmental consultant - 4 wheeled Level of assistance: Modified independence Comments: Decreased pace and step length                                                                                                                    TREATMENT DATE:  [facility] Adult PT Treatment:                                                DATE: 05/03/24 Therapeutic Exercise: Nustep x 10 min Seated  3# march x 20  Seated 3# LAQ x 20 HL blue band clam 20 x 2 Side hip abduction x 20  SLR 10 x 2 each  Standing Hip abduction 10 x 2 each  Standing heel raise x 10 Supported squats x 10   [facility] Adult PT Treatment:                                                 DATE: 04/28/24 Therapeutic Exercise: Nustep x 6 min 3# march 3# LAQ HL blue band clam  SLR 10 x 2 each  Hip abduction 10 x 2 each  STS x 10 Standing heel raise x 10 Supported squats x 10 Standing hip abduction 2 x 10 each      [facility] Adult PT Treatment:                                                DATE: 04/27/24 Therapeutic Exercise: Nustep 3# march  3# LAQ PPT H/s curls with feet on ball SLR 10 x 2 each  Hip abduction x 10 each  Manual Therapy: KT tape for posterior knee pain, 2 long strips , one lateral, one medial    [facility] Adult PT Treatment:                                                DATE: 04/21/24 Therapeutic Exercise: SLR 2x10 each Runners stretch rocking x 5 each  LAQ 3# 10 x 2 each  March 3# x 20 H/s curls with feet on ball x 10  Therapeutic Activity: Nustep L5 UE/LE x 5 minutes  Standing hip abdct  2 x 10 each 3# 5xsts x 2 elevated mat table to 22 c use of hands  [facility] Adult PT Treatment:                                                DATE: 04/19/24 Therapeutic Exercise: Runners stretch rocking x 5 each  LAQ 3# 10 x 2 each  March 3# x 20 HL Blue band 2 x 20  Side hip abdct x 10 each  Bridge with ball 10 x 2  H/s curls with feet on ball x 10   Therapeutic Activity: Nustep L5 UE/LE x 10 minutes  Standing hip abdct x 10 each  Side stepping at counter dwb x 2     PATIENT EDUCATION:  Education details: Eval findings, POC, HEP, self care  Person educated: Patient Education method: Explanation, Demonstration, Tactile cues, Verbal cues, and Handouts Education comprehension: verbalized understanding, returned demonstration, verbal cues required, and tactile cues required  HOME EXERCISE PROGRAM: Access Code: [email or web address] URL: [email or web address] Date: 03/18/2024 Prepared by: Dasie Daft  Exercises - Supine Quadricep Sets  - 7 x weekly - 2 sets - 10 reps - 5 sec hold - Active Straight Leg Raise with Quad Set  - 7 x weekly -  2 sets - 10 reps - Supine Heel Slide with Strap  - 7 x weekly -  2 sets - 10 reps - 5 sec hold - Seated Hamstring Stretch  - 7 x weekly - 2 reps - 30 sec hold - Seated Long Arc Quad  - 7 x weekly - 3 sets - 15 reps - Sit to Stand with Counter Support  - 7 x weekly - 2 sets - 5 reps - Side Stepping with Resistance at Ankles and Counter Support  - 7 x weekly - 3 reps - red band hold - Standing Hip Abduction with Resistance at Ankles and Counter Support  - 7 x weekly - 2 sets - 10 reps - red band hold  ASSESSMENT:  CLINICAL IMPRESSION: Continued working on LE strengthening.  Tolerates standing exercise but with rest breaks due to right knee and LBP. Will try aquatics next session to assess benefit.  Has [employer] membership currently and does not use it.   EVAL: Patient is a 73 y.o. female who was seen today for physical therapy evaluation and treatment for  Z96.652 (ICD-10-CM) - Total knee replacement status, left  S83.422A (ICD-10-CM) - Sprain of lateral collateral ligament of left knee, initial encounter   Pt presents with decreased LE strength, AROM, and chronic knee pain bilat L>R which is negatively impacting the pt's quality and tolerance for functional mobility. Pt will benefit from skilled PT 2w6 to address impairments to optimize leg function  and mobility with less pain.   OBJECTIVE IMPAIRMENTS: decreased activity tolerance, decreased balance, decreased endurance, decreased mobility, difficulty walking, decreased ROM, decreased strength, and increased edema, and pain.  PERSONAL FACTORS: Age, Fitness, Past/current experiences, Time since onset of injury/illness/exacerbation, and 1-2 comorbidities: high BMI, trunk postural changes are also affecting patient's functional outcome.   REHAB POTENTIAL: Good  CLINICAL DECISION MAKING: Evolving/moderate complexity  EVALUATION COMPLEXITY: Moderate   GOALS:  SHORT TERM GOALS: Target date: 04/08/24 Pt will be Ind in an initial HEP  Baseline:  started Goal status: MET  LONG TERM GOALS: Target date: 05/06/24  Pt will be Ind in a final HEP to maintain achieved  LOF  Baseline:  Goal status: INITIAL  2.  Increase pt's bilat knee and hip strengths by 1/2 muscle grade to improve pt's function with daily activities Baseline:  04/21/24: see flow sheets Goal status: IMPROVING  3.  Increased pt's bilat knee flexion to 105d or greater to improve pt's to obtain sitting positions Baseline: L 100d, R 90d 04/21/24: L 105; R 105 Goal status: MET  4.  Improve 5xSTS by MCID of 5 and by MCID of 36ft as indication of improved functional mobility  Baseline: 5xSTS= 29.5, 193 with SPC Goal status: ONGOING  5.  Pt will report 50% or greater improvement in her L knee pain for improved function and QOL Baseline: 4-8/10 05/03/24: Left knee is no longer painful, she is limited by right knee pain  Goal status: ONGOING  6.  Pt's LEFS score will improve by the the MCID to 49% or greater as indication of improved function  Baseline: 39% Goal status: INITIAL    PLAN:  PT FREQUENCY: 2x/week  PT DURATION: 6 weeks  PLANNED INTERVENTIONS: 97164- PT Re-evaluation, 97110-Therapeutic exercises, 97530- Therapeutic activity, 97112- Neuromuscular re-education, 97535- Self Care, 02859- Manual therapy, 97116- Gait training, [id number]- Vasopneumatic device, Patient/Family education, Balance training, Stair training, Taping, Joint mobilization, Cryotherapy, and Moist heat  PLAN FOR NEXT SESSION: Update Cert to include AQUATICS: assess response to HEP; progress therex as indicated; use of modalities, and manual therapy.   Harlene Persons, PTA 05/03/24 11:23  AM Phone: [phone] Fax: [phone]

## 2024-05-04 ENCOUNTER — Ambulatory Visit: Payer: Self-pay | Attending: Family | Admitting: Physical Therapy

## 2024-05-04 ENCOUNTER — Encounter: Payer: Self-pay | Admitting: Physical Therapy

## 2024-05-04 DIAGNOSIS — G8929 Other chronic pain: Secondary | ICD-10-CM | POA: Insufficient documentation

## 2024-05-04 DIAGNOSIS — M6281 Muscle weakness (generalized): Secondary | ICD-10-CM | POA: Diagnosis present

## 2024-05-04 DIAGNOSIS — R2689 Other abnormalities of gait and mobility: Secondary | ICD-10-CM | POA: Diagnosis present

## 2024-05-04 DIAGNOSIS — R262 Difficulty in walking, not elsewhere classified: Secondary | ICD-10-CM | POA: Diagnosis present

## 2024-05-04 DIAGNOSIS — M25562 Pain in left knee: Secondary | ICD-10-CM | POA: Insufficient documentation

## 2024-05-04 NOTE — Therapy (Unsigned)
 OUTPATIENT PHYSICAL THERAPY LOWER EXTREMITY TREATMENT   Patient Name: Alison Whitehead MRN: [id number] DOB:04/23/1951, 73 y.o., female Today's Date: 05/05/2024  END OF SESSION:  PT End of Session - 05/04/24 1525     Visit Number 13    Number of Visits 13    Date for Recertification  05/06/24    Authorization Type [employer] MEDICARE HMO/PPO    PT Start Time 1525    PT Stop Time 1605    PT Time Calculation (min) 40 min              Past Medical History:  Diagnosis Date   Allergy    seasonal   Arthritis    CKD (chronic kidney disease)    DDD (degenerative disc disease)    Degenerative disc disease, lumbar    Hypertension    IBS (irritable bowel syndrome)    Osteopenia    Osteoporosis    osteopenia   Stroke (HCC) 09/2019   Past Surgical History:  Procedure Laterality Date   SHOULDER SURGERY Left    lipoma   TOTAL KNEE ARTHROPLASTY Left 12/26/2022   Procedure: LEFT TOTAL KNEE ARTHROPLASTY;  Surgeon: Harden Jerona GAILS, MD;  Location: [facility] OR;  Service: Orthopedics;  Laterality: Left;   Patient Active Problem List   Diagnosis Date Noted   Total knee replacement status, 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   Rotator cuff syndrome, right 01/11/2020   Labile blood glucose    Vascular headache    Small vessel disease, cerebrovascular    Prediabetes    AKI (acute kidney injury)    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   Acute cerebrovascular accident (CVA) (HCC) 09/30/2019   Unilateral primary osteoarthritis, left knee 03/17/2019   Unilateral primary osteoarthritis, right knee 03/17/2019   Bilateral primary osteoarthritis of knee 03/10/2019   INSOMNIA, CHRONIC 05/19/2007   SPONDYLOSIS, LUMBAR 05/19/2007   Essential hypertension 05/03/2007   ARTHRITIS, KNEE 05/03/2007    PCP: Bridgette Sluder, PA  REFERRING PROVIDER: Valdemar Rocky SAUNDERS, NP  REFERRING DIAG:  [phone] (ICD-10-CM) - Total knee replacement status, left  D16.577J (ICD-10-CM) - Sprain of lateral collateral ligament of left knee,  initial encounter    THERAPY DIAG:  Chronic pain of left knee  Muscle weakness (generalized)  Difficulty in walking, not elsewhere classified  Rationale for Evaluation and Treatment: Rehabilitation  ONSET DATE: 12/26/22 A long time  SUBJECTIVE:   SUBJECTIVE STATEMENT: Pt reports continued knee pain.  EVAL: Pt reports a chronic Hx of knee pain L>R with a TKA of the L completed 5/24. She notes her knees bother her when walking with them starting to be more painful and swollen the past few months. She also endorses low back pain. She feels like thet are now less swollen.   PERTINENT HISTORY: See above. High BMI.  PAIN:  Are you having pain? Yes: NPRS scale: 5/10   (8/10 right knee, 8/10 left side starting Monday morning)  Pain location: L knee, both sides of the knee Pain description: ache, sharp Aggravating factors: Increased time on feey Relieving factors: Lidocaine  patches  PRECAUTIONS: None  RED FLAGS: None   WEIGHT BEARING RESTRICTIONS: No  FALLS:  Has patient fallen in last 6 months? No  LIVING ENVIRONMENT: Lives with: lives alone Lives in: House/apartment Stairs: No Has following equipment at home: Single point cane and Environmental consultant - 4 wheeled  OCCUPATION: retired  PLOF: Independent with household mobility with device and Independent with community mobility with device  PATIENT GOALS: To walk better s rollator if possible and  with less pain  NEXT MD VISIT: Not scheduled  OBJECTIVE:  Note: Objective measures were completed at Evaluation unless otherwise noted.  DIAGNOSTIC FINDINGS:  03/02/24 X-ray L Knee Radiographs of the left knee show stable alignment of total knee  arthroplasty hardware there is no complicating features.  There is  end-stage osteoarthritis present to the right knee.  With bone-on-bone  contact.   PATIENT SURVEYS:  LEFS: 31/80=39%   COGNITION: Overall cognitive status: Within functional limits for tasks  assessed     SENSATION: WFL  EDEMA:  Swelling present  MUSCLE LENGTH: Hamstrings: Right: Tight deg; Left tight deg Thomas test: Right Tight deg; Left Tight deg  POSTURE: increased lumbar lordosis and flexed trunk   PALPATION: TTP to the peri- L knee  LOWER EXTREMITY ROM:  Active ROM Right eval Left eval Right 03/31/24 Left 03/31/24  Hip flexion      Hip extension      Hip abduction      Hip adduction      Hip internal rotation      Hip external rotation      Knee flexion 90 100 110 110  Knee extension 15 lacking 15 lacking    Ankle dorsiflexion      Ankle plantarflexion      Ankle inversion      Ankle eversion       (Blank rows = not tested)  LOWER EXTREMITY MMT:  MMT Right eval Left eval Rt 04/21/24 Lt 04/21/24  Hip flexion 3 3 3  pain 3 pain  Hip extension 2 2    Hip abduction 3 3 4 4   Hip adduction      Hip internal rotation      Hip external rotation 3 3 4 4   Knee flexion 4 4 4+ 4+  Knee extension 4 4 4+ 4+  Ankle dorsiflexion      Ankle plantarflexion      Ankle inversion      Ankle eversion       (Blank rows = not tested)   FUNCTIONAL TESTS:  5 times sit to stand: 29.5  c use of hands 2 minute walk test: TBA  03/23/24: 193 feet with SPC, pt with increased SOB due to exertion  GAIT: Distance walked: 100' Assistive device utilized: Environmental consultant - 4 wheeled Level of assistance: Modified independence Comments: Decreased pace and step length                                                                                                                    TREATMENT DATE:   TREATMENT 05/04/24:  Aquatic therapy at [employer] [place]- [address] - therapeutic pool temp 90-92 degrees   Aquatic Therapy:  Water walking for warm up fwd/lat/bkwds  Alternating LAQ Flutter kicks Lateral flutter kicks Mini squat HS curl Heel raise Step up bil UE support Lat Lateral walking at pool edge Knee flexion stretch HS stretch Step up fwd and lat  Pt  requires the buoyancy of water for active assisted exercises  with buoyancy supported for strengthening and AROM exercises. Hydrostatic pressure also supports joints by unweighting joint load by at least 50 % in 3-4 feet depth water. 80% in chest to neck deep water. Water will provide assistance with movement using the current and laminar flow while the buoyancy reduces weight bearing. Pt requires the viscosity of the water for resistance with strengthening exercises.   [facility] Adult PT Treatment:                                                DATE: 05/03/24 Therapeutic Exercise: Nustep x 10 min Seated  3# march x 20   Seated 3# LAQ x 20 HL blue band clam 20 x 2 Side hip abduction x 20  SLR 10 x 2 each  Standing Hip abduction 10 x 2 each  Standing heel raise x 10 Supported squats x 10   [facility] Adult PT Treatment:                                                DATE: 04/28/24 Therapeutic Exercise: Nustep x 6 min 3# march 3# LAQ HL blue band clam  SLR 10 x 2 each  Hip abduction 10 x 2 each  STS x 10 Standing heel raise x 10 Supported squats x 10 Standing hip abduction 2 x 10 each      [facility] Adult PT Treatment:                                                DATE: 04/27/24 Therapeutic Exercise: Nustep 3# march  3# LAQ PPT H/s curls with feet on ball SLR 10 x 2 each  Hip abduction x 10 each  Manual Therapy: KT tape for posterior knee pain, 2 long strips , one lateral, one medial    [facility] Adult PT Treatment:                                                DATE: 04/21/24 Therapeutic Exercise: SLR 2x10 each Runners stretch rocking x 5 each  LAQ 3# 10 x 2 each  March 3# x 20 H/s curls with feet on ball x 10  Therapeutic Activity: Nustep L5 UE/LE x 5 minutes  Standing hip abdct  2 x 10 each 3# 5xsts x 2 elevated mat table to 22 c use of hands  [facility] Adult PT Treatment:                                                DATE: 04/19/24 Therapeutic Exercise: Runners stretch rocking x 5  each  LAQ 3# 10 x 2 each  March 3# x 20 HL Blue band 2 x 20  Side hip abdct x 10 each  Bridge with ball 10 x 2  H/s curls with feet on ball x 10  Therapeutic Activity: Nustep L5 UE/LE x 10 minutes  Standing hip abdct x 10 each  Side stepping at counter dwb x 2     PATIENT EDUCATION:  Education details: Eval findings, POC, HEP, self care  Person educated: Patient Education method: Explanation, Demonstration, Tactile cues, Verbal cues, and Handouts Education comprehension: verbalized understanding, returned demonstration, verbal cues required, and tactile cues required  HOME EXERCISE PROGRAM: Access Code: [email or web address] URL: [email or web address] Date: 03/18/2024 Prepared by: Dasie Daft  Exercises - Supine Quadricep Sets  - 7 x weekly - 2 sets - 10 reps - 5 sec hold - Active Straight Leg Raise with Quad Set  - 7 x weekly - 2 sets - 10 reps - Supine Heel Slide with Strap  - 7 x weekly - 2 sets - 10 reps - 5 sec hold - Seated Hamstring Stretch  - 7 x weekly - 2 reps - 30 sec hold - Seated Long Arc Quad  - 7 x weekly - 3 sets - 15 reps - Sit to Stand with Counter Support  - 7 x weekly - 2 sets - 5 reps - Side Stepping with Resistance at Ankles and Counter Support  - 7 x weekly - 3 reps - red band hold - Standing Hip Abduction with Resistance at Ankles and Counter Support  - 7 x weekly - 2 sets - 10 reps - red band hold  ASSESSMENT:  CLINICAL IMPRESSION: Session today focused on knee mobility and general LE strengthening in the aquatic environment for use of buoyancy to offload joints and the viscosity of water as resistance during therapeutic exercise.  Pt with improved tolerance to aquatic therapy vs land.  Pt initially anxious but this improves throughout session. Pt cued for form and pacing..  Patient was able to tolerate all prescribed exercises in the aquatic environment with no adverse effects and reports no increase in pain at the end of the session. Patient  continues to benefit from skilled PT services on land and aquatic based and should be progressed as able to improve functional independence.   EVAL: Patient is a 73 y.o. female who was seen today for physical therapy evaluation and treatment for  Z96.652 (ICD-10-CM) - Total knee replacement status, left  S83.422A (ICD-10-CM) - Sprain of lateral collateral ligament of left knee, initial encounter   Pt presents with decreased LE strength, AROM, and chronic knee pain bilat L>R which is negatively impacting the pt's quality and tolerance for functional mobility. Pt will benefit from skilled PT 2w6 to address impairments to optimize leg function  and mobility with less pain.   OBJECTIVE IMPAIRMENTS: decreased activity tolerance, decreased balance, decreased endurance, decreased mobility, difficulty walking, decreased ROM, decreased strength, and increased edema, and pain.  PERSONAL FACTORS: Age, Fitness, Past/current experiences, Time since onset of injury/illness/exacerbation, and 1-2 comorbidities: high BMI, trunk postural changes are also affecting patient's functional outcome.   REHAB POTENTIAL: Good  CLINICAL DECISION MAKING: Evolving/moderate complexity  EVALUATION COMPLEXITY: Moderate   GOALS:  SHORT TERM GOALS: Target date: 04/08/24 Pt will be Ind in an initial HEP  Baseline: started Goal status: MET  LONG TERM GOALS: Target date: 05/06/24  Pt will be Ind in a final HEP to maintain achieved  LOF  Baseline:  Goal status: INITIAL  2.  Increase pt's bilat knee and hip strengths by 1/2 muscle grade to improve pt's function with daily activities Baseline:  04/21/24: see flow sheets Goal status: IMPROVING  3.  Increased pt's bilat knee flexion to 105d or  greater to improve pt's to obtain sitting positions Baseline: L 100d, R 90d 04/21/24: L 105; R 105 Goal status: MET  4.  Improve 5xSTS by MCID of 5 and by MCID of 8ft as indication of improved functional mobility  Baseline:  5xSTS= 29.5, 193 with SPC Goal status: ONGOING  5.  Pt will report 50% or greater improvement in her L knee pain for improved function and QOL Baseline: 4-8/10 05/03/24: Left knee is no longer painful, she is limited by right knee pain  Goal status: ONGOING  6.  Pt's LEFS score will improve by the the MCID to 49% or greater as indication of improved function  Baseline: 39% Goal status: INITIAL    PLAN:  PT FREQUENCY: 2x/week  PT DURATION: 6 weeks  PLANNED INTERVENTIONS: 97164- PT Re-evaluation, 97110-Therapeutic exercises, 97530- Therapeutic activity, 97112- Neuromuscular re-education, 97535- Self Care, 02859- Manual therapy, 97116- Gait training, [phone]- Vasopneumatic device, Patient/Family education, Balance training, Stair training, Taping, Joint mobilization, Cryotherapy, and Moist heat  PLAN FOR NEXT SESSION: Update Cert to include AQUATICS: assess response to HEP; progress therex as indicated; use of modalities, and manual therapy.   Helene BRAVO Briarrose Shor PT 05/05/24 8:53 AM Phone: [phone] Fax: [phone]

## 2024-05-05 ENCOUNTER — Encounter: Admitting: Physical Therapy

## 2024-05-09 NOTE — Therapy (Signed)
 OUTPATIENT PHYSICAL THERAPY LOWER EXTREMITY TREATMENT   Patient Name: Alison Whitehead MRN: [id number] DOB:Dec 26, 1950, 73 y.o., female Today's Date: 05/10/2024  END OF SESSION:  PT End of Session - 05/10/24 1137     Visit Number 14    Number of Visits 25    Date for Recertification  07/01/24    Authorization Type [employer] MEDICARE HMO/PPO    PT Start Time 1105    PT Stop Time 1145    PT Time Calculation (min) 40 min    Activity Tolerance Patient tolerated treatment well;Patient limited by pain    Behavior During Therapy [facility] for tasks assessed/performed               Past Medical History:  Diagnosis Date   Allergy    seasonal   Arthritis    CKD (chronic kidney disease)    DDD (degenerative disc disease)    Degenerative disc disease, lumbar    Hypertension    IBS (irritable bowel syndrome)    Osteopenia    Osteoporosis    osteopenia   Stroke (HCC) 09/2019   Past Surgical History:  Procedure Laterality Date   SHOULDER SURGERY Left    lipoma   TOTAL KNEE ARTHROPLASTY Left 12/26/2022   Procedure: LEFT TOTAL KNEE ARTHROPLASTY;  Surgeon: Harden Jerona GAILS, MD;  Location: [facility] OR;  Service: Orthopedics;  Laterality: Left;   Patient Active Problem List   Diagnosis Date Noted   Total knee replacement status, 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   Rotator cuff syndrome, right 01/11/2020   Labile blood glucose    Vascular headache    Small vessel disease, cerebrovascular    Prediabetes    AKI (acute kidney injury)    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   Acute cerebrovascular accident (CVA) (HCC) 09/30/2019   Unilateral primary osteoarthritis, left knee 03/17/2019   Unilateral primary osteoarthritis, right knee 03/17/2019   Bilateral primary osteoarthritis of knee 03/10/2019   INSOMNIA, CHRONIC 05/19/2007   SPONDYLOSIS, LUMBAR 05/19/2007   Essential hypertension 05/03/2007   ARTHRITIS, KNEE 05/03/2007    PCP: Bridgette Sluder, PA  REFERRING PROVIDER: Valdemar Rocky SAUNDERS, NP  REFERRING  DIAG:  [phone] (ICD-10-CM) - Total knee replacement status, left  D16.577J (ICD-10-CM) - Sprain of lateral collateral ligament of left knee, initial encounter    THERAPY DIAG:  Chronic pain of left knee  Muscle weakness (generalized)  Difficulty in walking, not elsewhere classified  Other abnormalities of gait and mobility  Rationale for Evaluation and Treatment: Rehabilitation  ONSET DATE: 12/26/22 A long time  SUBJECTIVE:   SUBJECTIVE STATEMENT: Pt states she tried aquatics, but she felt uneasy in the water. Overall, she notes Pt has been helpful.  EVAL: Pt reports a chronic Hx of knee pain L>R with a TKA of the L completed 5/24. She notes her knees bother her when walking with them starting to be more painful and swollen the past few months. She also endorses low back pain. She feels like thet are now less swollen.   PERTINENT HISTORY: See above. High BMI.  PAIN:  Are you having pain? Yes: NPRS scale: 5/10   (8/10 right knee, 8/10 left side starting Monday morning)  Pain location: L knee, both sides of the knee Pain description: ache, sharp Aggravating factors: Increased time on feey Relieving factors: Lidocaine  patches  PRECAUTIONS: None  RED FLAGS: None   WEIGHT BEARING RESTRICTIONS: No  FALLS:  Has patient fallen in last 6 months? No  LIVING ENVIRONMENT: Lives with: lives alone Lives in: House/apartment  Stairs: No Has following equipment at home: Single point cane and Walker - 4 wheeled  OCCUPATION: retired  PLOF: Independent with household mobility with device and Independent with community mobility with device  PATIENT GOALS: To walk better s rollator if possible and with less pain  NEXT MD VISIT: Not scheduled  OBJECTIVE:  Note: Objective measures were completed at Evaluation unless otherwise noted.  DIAGNOSTIC FINDINGS:  03/02/24 X-ray L Knee Radiographs of the left knee show stable alignment of total knee  arthroplasty hardware there is no  complicating features.  There is  end-stage osteoarthritis present to the right knee.  With bone-on-bone  contact.   PATIENT SURVEYS:  LEFS: 31/80=39%   COGNITION: Overall cognitive status: Within functional limits for tasks assessed     SENSATION: WFL  EDEMA:  Swelling present  MUSCLE LENGTH: Hamstrings: Right: Tight deg; Left tight deg Thomas test: Right Tight deg; Left Tight deg  POSTURE: increased lumbar lordosis and flexed trunk   PALPATION: TTP to the peri- L knee  LOWER EXTREMITY ROM:  Active ROM Right eval Left eval Right 03/31/24 Left 03/31/24  Hip flexion      Hip extension      Hip abduction      Hip adduction      Hip internal rotation      Hip external rotation      Knee flexion 90 100 110 110  Knee extension 15 lacking 15 lacking    Ankle dorsiflexion      Ankle plantarflexion      Ankle inversion      Ankle eversion       (Blank rows = not tested)  LOWER EXTREMITY MMT:  MMT Right eval Left eval Rt 04/21/24 Lt 04/21/24  Hip flexion 3 3 3  pain 3 pain  Hip extension 2 2    Hip abduction 3 3 4 4   Hip adduction      Hip internal rotation      Hip external rotation 3 3 4 4   Knee flexion 4 4 4+ 4+  Knee extension 4 4 4+ 4+  Ankle dorsiflexion      Ankle plantarflexion      Ankle inversion      Ankle eversion       (Blank rows = not tested)   FUNCTIONAL TESTS:  5 times sit to stand: 29.5  c use of hands 2 minute walk test: TBA  03/23/24: 193 feet with SPC, pt with increased SOB due to exertion  GAIT: Distance walked: 100' Assistive device utilized: Environmental consultant - 4 wheeled Level of assistance: Modified independence Comments: Decreased pace and step length                                                                                                                    TREATMENT DATE:  [facility] Adult PT Treatment:  DATE: 05/10/24 Therapeutic Exercise/Activity: Nustep x 10  min 5xSTS LEFS Seated  3# march x 20  Seated 3# LAQ x 20 Seated blue band clam 20 x 2 Standing heel raise 10 x 2  TREATMENT 05/04/24:  Aquatic therapy at [facility]- [address] - therapeutic pool temp 90-92 degrees   Aquatic Therapy:  Water walking for warm up fwd/lat/bkwds  Alternating LAQ Flutter kicks Lateral flutter kicks Mini squat HS curl Heel raise Step up bil UE support Lat Lateral walking at pool edge Knee flexion stretch HS stretch Step up fwd and lat  Pt requires the buoyancy of water for active assisted exercises with buoyancy supported for strengthening and AROM exercises. Hydrostatic pressure also supports joints by unweighting joint load by at least 50 % in 3-4 feet depth water. 80% in chest to neck deep water. Water will provide assistance with movement using the current and laminar flow while the buoyancy reduces weight bearing. Pt requires the viscosity of the water for resistance with strengthening exercises.   [facility] Adult PT Treatment:                                                DATE: 05/03/24 Therapeutic Exercise: Nustep x 10 min Seated  3# march x 20   Seated 3# LAQ x 20 HL blue band clam 20 x 2 Side hip abduction x 20  SLR 10 x 2 each  Standing Hip abduction 10 x 2 each  Standing heel raise x 10 Supported squats x 10  PATIENT EDUCATION:  Education details: Eval findings, POC, HEP, self care  Person educated: Patient Education method: Explanation, Demonstration, Tactile cues, Verbal cues, and Handouts Education comprehension: verbalized understanding, returned demonstration, verbal cues required, and tactile cues required  HOME EXERCISE PROGRAM: Access Code: [email or web address] URL: [email or web address] Date: 03/18/2024 Prepared by: Dasie Daft  Exercises - Supine Quadricep Sets  - 7 x weekly - 2 sets - 10 reps - 5 sec hold - Active Straight Leg Raise with Quad Set  - 7 x weekly - 2 sets - 10 reps - Supine Heel Slide  with Strap  - 7 x weekly - 2 sets - 10 reps - 5 sec hold - Seated Hamstring Stretch  - 7 x weekly - 2 reps - 30 sec hold - Seated Long Arc Quad  - 7 x weekly - 3 sets - 15 reps - Sit to Stand with Counter Support  - 7 x weekly - 2 sets - 5 reps - Side Stepping with Resistance at Ankles and Counter Support  - 7 x weekly - 3 reps - red band hold - Standing Hip Abduction with Resistance at Ankles and Counter Support  - 7 x weekly - 2 sets - 10 reps - red band hold  ASSESSMENT:  CLINICAL IMPRESSION:  Reassessed pt's progress with PT. Overall, she had made good progress re: knee pain and function. 5xSTS time has decreased. Her is the same and is limited by DOE as well as knee pain. Continued PT Pt will continue to benefit from skilled PT 2w6 to further address impairments for improved functional mobility  EVAL: Patient is a 73 y.o. female who was seen today for physical therapy evaluation and treatment for  Z96.652 (ICD-10-CM) - Total knee replacement status, left  S83.422A (ICD-10-CM) - Sprain of lateral collateral ligament of left knee, initial  encounter   Pt presents with decreased LE strength, AROM, and chronic knee pain bilat L>R which is negatively impacting the pt's quality and tolerance for functional mobility. Pt will benefit from skilled PT 2w6 to address impairments to optimize leg function  and mobility with less pain.   OBJECTIVE IMPAIRMENTS: decreased activity tolerance, decreased balance, decreased endurance, decreased mobility, difficulty walking, decreased ROM, decreased strength, and increased edema, and pain.  PERSONAL FACTORS: Age, Fitness, Past/current experiences, Time since onset of injury/illness/exacerbation, and 1-2 comorbidities: high BMI, trunk postural changes are also affecting patient's functional outcome.   REHAB POTENTIAL: Good  CLINICAL DECISION MAKING: Evolving/moderate complexity  EVALUATION COMPLEXITY: Moderate   GOALS:  SHORT TERM GOALS: Target  date: 04/08/24 Pt will be Ind in an initial HEP  Baseline: started Goal status: MET  LONG TERM GOALS: Target date: 07/01/24  Pt will be Ind in a final HEP to maintain achieved  LOF  Baseline:  Goal status: INITIAL  2.  Increase pt's bilat knee and hip strengths by 1/2 muscle grade to improve pt's function with daily activities Baseline:  04/21/24: see flow sheets Goal status: IMPROVING  3.  Increased pt's bilat knee flexion to 105d or greater to improve pt's to obtain sitting positions Baseline: L 100d, R 90d 04/21/24: L 105; R 105 Goal status: MET  4.  Improve 5xSTS by MCID of 5 and by MCID of 39ft as indication of improved functional mobility  Baseline: 5xSTS= 29.5, 193 with SPC 05/10/24: 21.8 sec x use of hands; 191 c SPC limited by knee pain and DOE Goal status: IMPROVED 5xSTS  5.  Pt will report 50% or greater improvement in her L knee pain for improved function and QOL Baseline: 4-8/10 05/03/24: Left knee is no longer painful, she is limited by right knee pain  05/10/24: L knee-50%; R 25% Goal status: IMPROVED  6.  Pt's LEFS score will improve by the the MCID to 49% or greater as indication of improved function  Baseline: 39% 05/10/24: 45% Goal status: IMPROVED    PLAN:  PT FREQUENCY: 2x/week  PT DURATION: 6 weeks  PLANNED INTERVENTIONS: 97164- PT Re-evaluation, 97110-Therapeutic exercises, 97530- Therapeutic activity, 97112- Neuromuscular re-education, 97535- Self Care, 02859- Manual therapy, 97116- Gait training, [phone]- Vasopneumatic device, Patient/Family education, Balance training, Stair training, Taping, Joint mobilization, Cryotherapy, and Moist heat  PLAN FOR NEXT SESSION: Update Cert to include AQUATICS: assess response to HEP; progress therex as indicated; use of modalities, and manual therapy.   Mayank Teuscher PT 05/10/24 12:48 PM Phone: [phone] Fax: [phone]

## 2024-05-10 ENCOUNTER — Ambulatory Visit

## 2024-05-10 DIAGNOSIS — R2689 Other abnormalities of gait and mobility: Secondary | ICD-10-CM

## 2024-05-10 DIAGNOSIS — M25562 Pain in left knee: Secondary | ICD-10-CM | POA: Diagnosis not present

## 2024-05-10 DIAGNOSIS — M6281 Muscle weakness (generalized): Secondary | ICD-10-CM

## 2024-05-10 DIAGNOSIS — R262 Difficulty in walking, not elsewhere classified: Secondary | ICD-10-CM

## 2024-05-10 DIAGNOSIS — G8929 Other chronic pain: Secondary | ICD-10-CM

## 2024-05-12 ENCOUNTER — Encounter: Payer: Self-pay | Admitting: Physical Therapy

## 2024-05-12 ENCOUNTER — Ambulatory Visit: Admitting: Physical Therapy

## 2024-05-12 DIAGNOSIS — M6281 Muscle weakness (generalized): Secondary | ICD-10-CM

## 2024-05-12 DIAGNOSIS — M25562 Pain in left knee: Secondary | ICD-10-CM | POA: Diagnosis not present

## 2024-05-12 DIAGNOSIS — G8929 Other chronic pain: Secondary | ICD-10-CM

## 2024-05-12 NOTE — Therapy (Signed)
 OUTPATIENT PHYSICAL THERAPY LOWER EXTREMITY TREATMENT   Patient Name: Alison Whitehead MRN: [id number] DOB:02/03/1951, 73 y.o., female Today's Date: 05/12/2024  END OF SESSION:  PT End of Session - 05/12/24 1046     Visit Number 15    Number of Visits 25    Date for Recertification  07/01/24    Authorization Type [employer] MEDICARE HMO/PPO    PT Start Time 1045    PT Stop Time 1130    PT Time Calculation (min) 45 min               Past Medical History:  Diagnosis Date   Allergy    seasonal   Arthritis    CKD (chronic kidney disease)    DDD (degenerative disc disease)    Degenerative disc disease, lumbar    Hypertension    IBS (irritable bowel syndrome)    Osteopenia    Osteoporosis    osteopenia   Stroke (HCC) 09/2019   Past Surgical History:  Procedure Laterality Date   SHOULDER SURGERY Left    lipoma   TOTAL KNEE ARTHROPLASTY Left 12/26/2022   Procedure: LEFT TOTAL KNEE ARTHROPLASTY;  Surgeon: Harden Jerona GAILS, MD;  Location: [facility] OR;  Service: Orthopedics;  Laterality: Left;   Patient Active Problem List   Diagnosis Date Noted   Total knee replacement status, 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   Rotator cuff syndrome, right 01/11/2020   Labile blood glucose    Vascular headache    Small vessel disease, cerebrovascular    Prediabetes    AKI (acute kidney injury)    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   Acute cerebrovascular accident (CVA) (HCC) 09/30/2019   Unilateral primary osteoarthritis, left knee 03/17/2019   Unilateral primary osteoarthritis, right knee 03/17/2019   Bilateral primary osteoarthritis of knee 03/10/2019   INSOMNIA, CHRONIC 05/19/2007   SPONDYLOSIS, LUMBAR 05/19/2007   Essential hypertension 05/03/2007   ARTHRITIS, KNEE 05/03/2007    PCP: Bridgette Sluder, PA  REFERRING PROVIDER: Valdemar Rocky SAUNDERS, NP  REFERRING DIAG:  [phone] (ICD-10-CM) - Total knee replacement status, left  D16.577J (ICD-10-CM) - Sprain of lateral collateral ligament of left  knee, initial encounter    THERAPY DIAG:  Chronic pain of left knee  Muscle weakness (generalized)  Rationale for Evaluation and Treatment: Rehabilitation  ONSET DATE: 12/26/22 A long time  SUBJECTIVE:   SUBJECTIVE STATEMENT: Pt would like to try aquatics one more time now that she knows what to expect.   EVAL: Pt reports a chronic Hx of knee pain L>R with a TKA of the L completed 5/24. She notes her knees bother her when walking with them starting to be more painful and swollen the past few months. She also endorses low back pain. She feels like thet are now less swollen.   PERTINENT HISTORY: See above. High BMI.  PAIN:  Are you having pain? Yes: NPRS scale: 5/10   (8/10 right knee, 8/10 left side starting Monday morning)  Pain location: L knee, both sides of the knee Pain description: ache, sharp Aggravating factors: Increased time on feey Relieving factors: Lidocaine  patches  PRECAUTIONS: None  RED FLAGS: None   WEIGHT BEARING RESTRICTIONS: No  FALLS:  Has patient fallen in last 6 months? No  LIVING ENVIRONMENT: Lives with: lives alone Lives in: House/apartment Stairs: No Has following equipment at home: Single point cane and Environmental consultant - 4 wheeled  OCCUPATION: retired  PLOF: Independent with household mobility with device and Independent with community mobility with device  PATIENT GOALS: To walk  better s rollator if possible and with less pain  NEXT MD VISIT: Not scheduled  OBJECTIVE:  Note: Objective measures were completed at Evaluation unless otherwise noted.  DIAGNOSTIC FINDINGS:  03/02/24 X-ray L Knee Radiographs of the left knee show stable alignment of total knee  arthroplasty hardware there is no complicating features.  There is  end-stage osteoarthritis present to the right knee.  With bone-on-bone  contact.   PATIENT SURVEYS:  LEFS: 31/80=39% 05/12/24: 45%   COGNITION: Overall cognitive status: Within functional limits for tasks  assessed     SENSATION: WFL  EDEMA:  Swelling present  MUSCLE LENGTH: Hamstrings: Right: Tight deg; Left tight deg Debby test: Right Tight deg; Left Tight deg  POSTURE: increased lumbar lordosis and flexed trunk   PALPATION: TTP to the peri- L knee  LOWER EXTREMITY ROM:  Active ROM Right eval Left eval Right 03/31/24 Left 03/31/24  Hip flexion      Hip extension      Hip abduction      Hip adduction      Hip internal rotation      Hip external rotation      Knee flexion 90 100 110 110  Knee extension 15 lacking 15 lacking    Ankle dorsiflexion      Ankle plantarflexion      Ankle inversion      Ankle eversion       (Blank rows = not tested)  LOWER EXTREMITY MMT:  MMT Right eval Left eval Rt 04/21/24 Lt 04/21/24  Hip flexion 3 3 3  pain 3 pain  Hip extension 2 2    Hip abduction 3 3 4 4   Hip adduction      Hip internal rotation      Hip external rotation 3 3 4 4   Knee flexion 4 4 4+ 4+  Knee extension 4 4 4+ 4+  Ankle dorsiflexion      Ankle plantarflexion      Ankle inversion      Ankle eversion       (Blank rows = not tested)   FUNCTIONAL TESTS:  5 times sit to stand: 29.5  c use of hands 2 minute walk test: TBA  03/23/24: 193 feet with SPC, pt with increased SOB due to exertion  05/12/24: 215 feet   GAIT: Distance walked: 100' Assistive device utilized: Environmental consultant - 4 wheeled Level of assistance: Modified independence Comments: Decreased pace and step length                                                                                                                    TREATMENT DATE:  [facility] Adult PT Treatment:                                                DATE: 05/12/24 Therapeutic Exercise: Nustep L5  x 10 minutes  Seated 3# march 20 x 2  Seated LAQ 3# 20 x 2 each  Manual Therapy: STW posterior right knee   Therapeutic Activity: 2 MWT 215 feet  with SPC STS 5 x 1    [facility] Adult PT Treatment:                                                 DATE: 05/10/24 Therapeutic Exercise/Activity: Nustep x 10 min 5xSTS LEFS Seated  3# march x 20  Seated 3# LAQ x 20 Seated blue band clam 20 x 2 Standing heel raise 10 x 2  TREATMENT 05/04/24:  Aquatic therapy at [facility]- [address] - therapeutic pool temp 90-92 degrees   Aquatic Therapy:  Water walking for warm up fwd/lat/bkwds  Alternating LAQ Flutter kicks Lateral flutter kicks Mini squat HS curl Heel raise Step up bil UE support Lat Lateral walking at pool edge Knee flexion stretch HS stretch Step up fwd and lat  Pt requires the buoyancy of water for active assisted exercises with buoyancy supported for strengthening and AROM exercises. Hydrostatic pressure also supports joints by unweighting joint load by at least 50 % in 3-4 feet depth water. 80% in chest to neck deep water. Water will provide assistance with movement using the current and laminar flow while the buoyancy reduces weight bearing. Pt requires the viscosity of the water for resistance with strengthening exercises.   [facility] Adult PT Treatment:                                                DATE: 05/03/24 Therapeutic Exercise: Nustep x 10 min Seated  3# march x 20   Seated 3# LAQ x 20 HL blue band clam 20 x 2 Side hip abduction x 20  SLR 10 x 2 each  Standing Hip abduction 10 x 2 each  Standing heel raise x 10 Supported squats x 10  PATIENT EDUCATION:  Education details: Eval findings, POC, HEP, self care  Person educated: Patient Education method: Explanation, Demonstration, Tactile cues, Verbal cues, and Handouts Education comprehension: verbalized understanding, returned demonstration, verbal cues required, and tactile cues required  HOME EXERCISE PROGRAM: Access Code: [email or web address] URL: [email or web address] Date: 03/18/2024 Prepared by: Dasie Daft  Exercises - Supine Quadricep Sets  - 7 x weekly - 2 sets - 10 reps - 5 sec hold - Active Straight Leg Raise with  Quad Set  - 7 x weekly - 2 sets - 10 reps - Supine Heel Slide with Strap  - 7 x weekly - 2 sets - 10 reps - 5 sec hold - Seated Hamstring Stretch  - 7 x weekly - 2 reps - 30 sec hold - Seated Long Arc Quad  - 7 x weekly - 3 sets - 15 reps - Sit to Stand with Counter Support  - 7 x weekly - 2 sets - 5 reps - Side Stepping with Resistance at Ankles and Counter Support  - 7 x weekly - 3 reps - red band hold - Standing Hip Abduction with Resistance at Ankles and Counter Support  - 7 x weekly - 2 sets - 10 reps - red band hold  ASSESSMENT:  CLINICAL IMPRESSION:  Pt reports right knee continues  to limit function as well as SOB. She did have improved 2 MWT today but is SOB by end of 2 minutes. Manual performed to reduce posterior knee pain and she reported decreased pain afterward. Continued PT Pt will continue to benefit from skilled PT 2w6 to further address impairments for improved functional mobility  EVAL: Patient is a 73 y.o. female who was seen today for physical therapy evaluation and treatment for  Z96.652 (ICD-10-CM) - Total knee replacement status, left  S83.422A (ICD-10-CM) - Sprain of lateral collateral ligament of left knee, initial encounter   Pt presents with decreased LE strength, AROM, and chronic knee pain bilat L>R which is negatively impacting the pt's quality and tolerance for functional mobility. Pt will benefit from skilled PT 2w6 to address impairments to optimize leg function  and mobility with less pain.   OBJECTIVE IMPAIRMENTS: decreased activity tolerance, decreased balance, decreased endurance, decreased mobility, difficulty walking, decreased ROM, decreased strength, and increased edema, and pain.  PERSONAL FACTORS: Age, Fitness, Past/current experiences, Time since onset of injury/illness/exacerbation, and 1-2 comorbidities: high BMI, trunk postural changes are also affecting patient's functional outcome.   REHAB POTENTIAL: Good  CLINICAL DECISION MAKING:  Evolving/moderate complexity  EVALUATION COMPLEXITY: Moderate   GOALS:  SHORT TERM GOALS: Target date: 04/08/24 Pt will be Ind in an initial HEP  Baseline: started Goal status: MET  LONG TERM GOALS: Target date: 07/01/24  Pt will be Ind in a final HEP to maintain achieved  LOF  Baseline:  Goal status: INITIAL  2.  Increase pt's bilat knee and hip strengths by 1/2 muscle grade to improve pt's function with daily activities Baseline:  04/21/24: see flow sheets Goal status: IMPROVING  3.  Increased pt's bilat knee flexion to 105d or greater to improve pt's to obtain sitting positions Baseline: L 100d, R 90d 04/21/24: L 105; R 105 Goal status: MET  4.  Improve 5xSTS by MCID of 5 and by MCID of 63ft as indication of improved functional mobility  Baseline: 5xSTS= 29.5, 193 with SPC 05/10/24: 21.8 sec x use of hands; 191 c SPC limited by knee pain and DOE Goal status: IMPROVED 5xSTS  5.  Pt will report 50% or greater improvement in her L knee pain for improved function and QOL Baseline: 4-8/10 05/03/24: Left knee is no longer painful, she is limited by right knee pain  05/10/24: L knee-50%; R 25% Goal status: IMPROVED  6.  Pt's LEFS score will improve by the the MCID to 49% or greater as indication of improved function  Baseline: 39% 05/10/24: 45% Goal status: IMPROVED    PLAN:  PT FREQUENCY: 2x/week  PT DURATION: 6 weeks  PLANNED INTERVENTIONS: 97164- PT Re-evaluation, 97110-Therapeutic exercises, 97530- Therapeutic activity, 97112- Neuromuscular re-education, 97535- Self Care, 02859- Manual therapy, 97116- Gait training, [phone]- Vasopneumatic device, Patient/Family education, Balance training, Stair training, Taping, Joint mobilization, Cryotherapy, and Moist heat  PLAN FOR NEXT SESSION: Update Cert to include AQUATICS: assess response to HEP; progress therex as indicated; use of modalities, and manual therapy.   Harlene CHRISTELLA Persons PTA 05/12/24 11:42 AM Phone:  [phone] Fax: [phone]

## 2024-05-19 ENCOUNTER — Ambulatory Visit: Payer: Self-pay | Admitting: Physical Therapy

## 2024-05-19 ENCOUNTER — Encounter: Payer: Self-pay | Admitting: Physical Therapy

## 2024-05-19 DIAGNOSIS — G8929 Other chronic pain: Secondary | ICD-10-CM

## 2024-05-19 DIAGNOSIS — M25562 Pain in left knee: Secondary | ICD-10-CM | POA: Diagnosis not present

## 2024-05-19 DIAGNOSIS — R262 Difficulty in walking, not elsewhere classified: Secondary | ICD-10-CM

## 2024-05-19 DIAGNOSIS — M6281 Muscle weakness (generalized): Secondary | ICD-10-CM

## 2024-05-19 NOTE — Therapy (Signed)
 OUTPATIENT PHYSICAL THERAPY LOWER EXTREMITY TREATMENT     Patient Name: Alison Whitehead MRN: [id number] DOB:08-Jan-1951, 73 y.o., female Today's Date: 05/19/2024  END OF SESSION:  PT End of Session - 05/19/24 1332     Visit Number 16    Number of Visits 25    Date for Recertification  07/01/24    PT Start Time 1333    PT Stop Time 1416    PT Time Calculation (min) 43 min    Activity Tolerance Patient tolerated treatment well;Patient limited by pain    Behavior During Therapy [facility] for tasks assessed/performed                Past Medical History:  Diagnosis Date   Allergy    seasonal   Arthritis    CKD (chronic kidney disease)    DDD (degenerative disc disease)    Degenerative disc disease, lumbar    Hypertension    IBS (irritable bowel syndrome)    Osteopenia    Osteoporosis    osteopenia   Stroke (HCC) 09/2019   Past Surgical History:  Procedure Laterality Date   SHOULDER SURGERY Left    lipoma   TOTAL KNEE ARTHROPLASTY Left 12/26/2022   Procedure: LEFT TOTAL KNEE ARTHROPLASTY;  Surgeon: Harden Jerona GAILS, MD;  Location: [facility] OR;  Service: Orthopedics;  Laterality: Left;   Patient Active Problem List   Diagnosis Date Noted   Total knee replacement status, 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   Rotator cuff syndrome, right 01/11/2020   Labile blood glucose    Vascular headache    Small vessel disease, cerebrovascular    Prediabetes    AKI (acute kidney injury)    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   Acute cerebrovascular accident (CVA) (HCC) 09/30/2019   Unilateral primary osteoarthritis, left knee 03/17/2019   Unilateral primary osteoarthritis, right knee 03/17/2019   Bilateral primary osteoarthritis of knee 03/10/2019   INSOMNIA, CHRONIC 05/19/2007   SPONDYLOSIS, LUMBAR 05/19/2007   Essential hypertension 05/03/2007   ARTHRITIS, KNEE 05/03/2007    PCP: Bridgette Sluder, PA  REFERRING PROVIDER: Valdemar Rocky SAUNDERS, NP  REFERRING DIAG:  [phone] (ICD-10-CM) - Total  knee replacement status, left  D16.577J (ICD-10-CM) - Sprain of lateral collateral ligament of left knee, initial encounter    THERAPY DIAG:  Chronic pain of left knee  Muscle weakness (generalized)  Difficulty in walking, not elsewhere classified  Rationale for Evaluation and Treatment: Rehabilitation  ONSET DATE: 12/26/22 A long time  SUBJECTIVE:   SUBJECTIVE STATEMENT: The right knee is bothering me more than the left. The right knee is an 8/10.  EVAL: Pt reports a chronic Hx of knee pain L>R with a TKA of the L completed 5/24. She notes her knees bother her when walking with them starting to be more painful and swollen the past few months. She also endorses low back pain. She feels like thet are now less swollen.   PERTINENT HISTORY: See above. High BMI.  PAIN:  Are you having pain? Yes: NPRS scale: 0/10   (8/10 right knee, 8/10 left side starting Monday morning)  Pain location: L knee, both sides of the knee Pain description: ache, sharp Aggravating factors: Increased time on feey Relieving factors: Lidocaine  patches  PRECAUTIONS: None  RED FLAGS: None   WEIGHT BEARING RESTRICTIONS: No  FALLS:  Has patient fallen in last 6 months? No  LIVING ENVIRONMENT: Lives with: lives alone Lives in: House/apartment Stairs: No Has following equipment at home: Single point cane and Environmental consultant - 4 wheeled  OCCUPATION: retired  PLOF: Independent with household mobility with device and Independent with community mobility with device  PATIENT GOALS: To walk better s rollator if possible and with less pain  NEXT MD VISIT: Not scheduled  OBJECTIVE:  Note: Objective measures were completed at Evaluation unless otherwise noted.  DIAGNOSTIC FINDINGS:  03/02/24 X-ray L Knee Radiographs of the left knee show stable alignment of total knee  arthroplasty hardware there is no complicating features.  There is  end-stage osteoarthritis present to the right knee.  With bone-on-bone   contact.   PATIENT SURVEYS:  LEFS: 31/80=39% 05/12/24: 45%   COGNITION: Overall cognitive status: Within functional limits for tasks assessed     SENSATION: WFL  EDEMA:  Swelling present  MUSCLE LENGTH: Hamstrings: Right: Tight deg; Left tight deg Debby test: Right Tight deg; Left Tight deg  POSTURE: increased lumbar lordosis and flexed trunk   PALPATION: TTP to the peri- L knee  LOWER EXTREMITY ROM:  Active ROM Right eval Left eval Right 03/31/24 Left 03/31/24 Left 05/19/24  Hip flexion       Hip extension       Hip abduction       Hip adduction       Hip internal rotation       Hip external rotation       Knee flexion 90 100 110 110 112  Knee extension 15 lacking 15 lacking     Ankle dorsiflexion       Ankle plantarflexion       Ankle inversion       Ankle eversion        (Blank rows = not tested)  LOWER EXTREMITY MMT:  MMT Right eval Left eval Rt 04/21/24 Lt 04/21/24  Hip flexion 3 3 3  pain 3 pain  Hip extension 2 2    Hip abduction 3 3 4 4   Hip adduction      Hip internal rotation      Hip external rotation 3 3 4 4   Knee flexion 4 4 4+ 4+  Knee extension 4 4 4+ 4+  Ankle dorsiflexion      Ankle plantarflexion      Ankle inversion      Ankle eversion       (Blank rows = not tested)   FUNCTIONAL TESTS:  5 times sit to stand: 29.5  c use of hands 2 minute walk test: TBA  03/23/24: 193 feet with SPC, pt with increased SOB due to exertion  05/12/24: 215 feet   GAIT: Distance walked: 100' Assistive device utilized: Environmental consultant - 4 wheeled Level of assistance: Modified independence Comments: Decreased pace and step length                                                                                                                    TREATMENT DATE:  [facility] Adult PT Treatment:  DATE: 05/19/24 Nustep L6 x 10 min 2 MWT 159 ft with SPC IASTM along the R patellar and quad tendon MTPR along the R  quad x 3 LAQ 4# 2 x 20, bil Bil marching 1 x 20 bil 4# Sit to stand 1 x 10 (cues on how proper mechanics) Seated low back stretch 2 x 10   [facility] Adult PT Treatment:                                                DATE: 05/12/24 Therapeutic Exercise: Nustep L5  x 10 minutes Seated 3# march 20 x 2  Seated LAQ 3# 20 x 2 each  Manual Therapy: STW posterior right knee   Therapeutic Activity: 2 MWT 215 feet  with SPC STS 5 x 1   [facility] Adult PT Treatment:                                                DATE: 05/10/24 Therapeutic Exercise/Activity: Nustep x 10 min 5xSTS LEFS Seated  3# march x 20  Seated 3# LAQ x 20 Seated blue band clam 20 x 2 Standing heel raise 10 x 2  TREATMENT 05/04/24:  Aquatic therapy at [facility]- [address] - therapeutic pool temp 90-92 degrees   Aquatic Therapy:  Water walking for warm up fwd/lat/bkwds  Alternating LAQ Flutter kicks Lateral flutter kicks Mini squat HS curl Heel raise Step up bil UE support Lat Lateral walking at pool edge Knee flexion stretch HS stretch Step up fwd and lat  Pt requires the buoyancy of water for active assisted exercises with buoyancy supported for strengthening and AROM exercises. Hydrostatic pressure also supports joints by unweighting joint load by at least 50 % in 3-4 feet depth water. 80% in chest to neck deep water. Water will provide assistance with movement using the current and laminar flow while the buoyancy reduces weight bearing. Pt requires the viscosity of the water for resistance with strengthening exercises.   ASSESSMENT:  CLINICAL IMPRESSION:  Mrs Hendrie arrives to PT noting the L knee is doing better with no pain and her CC is R knee pain at 8/10. The L knee ROM is improving but she demonstrates limited R knee flexion during her gait cycle but is able to flex the R knee in sitting. She noted relief of tension in the R knee with STW along the r quad and IASTM along the quad and  patellar tendons. Continued working on strengthening with increased resistance. End of sesession she noted feeling better.   EVAL: Patient is a 73 y.o. female who was seen today for physical therapy evaluation and treatment for  Z96.652 (ICD-10-CM) - Total knee replacement status, left  S83.422A (ICD-10-CM) - Sprain of lateral collateral ligament of left knee, initial encounter   Pt presents with decreased LE strength, AROM, and chronic knee pain bilat L>R which is negatively impacting the pt's quality and tolerance for functional mobility. Pt will benefit from skilled PT 2w6 to address impairments to optimize leg function  and mobility with less pain.   OBJECTIVE IMPAIRMENTS: decreased activity tolerance, decreased balance, decreased endurance, decreased mobility, difficulty walking, decreased ROM, decreased strength, and increased edema, and pain.  PERSONAL FACTORS: Age,  Fitness, Past/current experiences, Time since onset of injury/illness/exacerbation, and 1-2 comorbidities: high BMI, trunk postural changes are also affecting patient's functional outcome.   REHAB POTENTIAL: Good  CLINICAL DECISION MAKING: Evolving/moderate complexity  EVALUATION COMPLEXITY: Moderate   GOALS:  SHORT TERM GOALS: Target date: 04/08/24 Pt will be Ind in an initial HEP  Baseline: started Goal status: MET  LONG TERM GOALS: Target date: 07/01/24  Pt will be Ind in a final HEP to maintain achieved  LOF  Baseline:  Goal status: INITIAL  2.  Increase pt's bilat knee and hip strengths by 1/2 muscle grade to improve pt's function with daily activities Baseline:  04/21/24: see flow sheets Goal status: IMPROVING  3.  Increased pt's bilat knee flexion to 105d or greater to improve pt's to obtain sitting positions Baseline: L 100d, R 90d 04/21/24: L 105; R 105 Goal status: MET  4.  Improve 5xSTS by MCID of 5 and by MCID of 33ft as indication of improved functional mobility  Baseline: 5xSTS= 29.5,  193 with SPC 05/10/24: 21.8 sec x use of hands; 191 c SPC limited by knee pain and DOE Goal status: IMPROVED 5xSTS  5.  Pt will report 50% or greater improvement in her L knee pain for improved function and QOL Baseline: 4-8/10 05/03/24: Left knee is no longer painful, she is limited by right knee pain  05/10/24: L knee-50%; R 25% Goal status: IMPROVED  6.  Pt's LEFS score will improve by the the MCID to 49% or greater as indication of improved function  Baseline: 39% 05/10/24: 45% Goal status: IMPROVED    PLAN:  PT FREQUENCY: 2x/week  PT DURATION: 6 weeks  PLANNED INTERVENTIONS: 97164- PT Re-evaluation, 97110-Therapeutic exercises, 97530- Therapeutic activity, 97112- Neuromuscular re-education, 97535- Self Care, [id number]- Manual therapy, 97116- Gait training, [phone]- Vasopneumatic device, Patient/Family education, Balance training, Stair training, Taping, Joint mobilization, Cryotherapy, and Moist heat  PLAN FOR NEXT SESSION: Update Cert to include AQUATICS: assess response to HEP; progress therex as indicated; use of modalities, and manual therapy.   Lennis Korb PT, DPT, LAT, ATC  05/19/24  2:28 PM

## 2024-05-25 ENCOUNTER — Ambulatory Visit: Payer: Self-pay | Admitting: Physical Therapy

## 2024-05-25 ENCOUNTER — Encounter: Payer: Self-pay | Admitting: Physical Therapy

## 2024-05-25 DIAGNOSIS — M25562 Pain in left knee: Secondary | ICD-10-CM | POA: Diagnosis not present

## 2024-05-25 DIAGNOSIS — G8929 Other chronic pain: Secondary | ICD-10-CM

## 2024-05-25 DIAGNOSIS — R262 Difficulty in walking, not elsewhere classified: Secondary | ICD-10-CM

## 2024-05-25 DIAGNOSIS — M6281 Muscle weakness (generalized): Secondary | ICD-10-CM

## 2024-05-25 NOTE — Therapy (Signed)
 OUTPATIENT PHYSICAL THERAPY LOWER EXTREMITY TREATMENT     Patient Name: Alison Whitehead MRN: [id number] DOB:30-Sep-1950, 73 y.o., female Today's Date: 05/25/2024  END OF SESSION:  PT End of Session - 05/25/24 1336     Visit Number 17    Number of Visits 25    Date for Recertification  07/01/24    PT Start Time 1335    PT Stop Time 1415    PT Time Calculation (min) 40 min    Activity Tolerance Patient tolerated treatment well;Patient limited by pain    Behavior During Therapy [facility] for tasks assessed/performed                 Past Medical History:  Diagnosis Date   Allergy    seasonal   Arthritis    CKD (chronic kidney disease)    DDD (degenerative disc disease)    Degenerative disc disease, lumbar    Hypertension    IBS (irritable bowel syndrome)    Osteopenia    Osteoporosis    osteopenia   Stroke (HCC) 09/2019   Past Surgical History:  Procedure Laterality Date   SHOULDER SURGERY Left    lipoma   TOTAL KNEE ARTHROPLASTY Left 12/26/2022   Procedure: LEFT TOTAL KNEE ARTHROPLASTY;  Surgeon: Harden Jerona GAILS, MD;  Location: [facility] OR;  Service: Orthopedics;  Laterality: Left;   Patient Active Problem List   Diagnosis Date Noted   Total knee replacement status, 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   Rotator cuff syndrome, right 01/11/2020   Labile blood glucose    Vascular headache    Small vessel disease, cerebrovascular    Prediabetes    AKI (acute kidney injury)    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   Acute cerebrovascular accident (CVA) (HCC) 09/30/2019   Unilateral primary osteoarthritis, left knee 03/17/2019   Unilateral primary osteoarthritis, right knee 03/17/2019   Bilateral primary osteoarthritis of knee 03/10/2019   INSOMNIA, CHRONIC 05/19/2007   SPONDYLOSIS, LUMBAR 05/19/2007   Essential hypertension 05/03/2007   ARTHRITIS, KNEE 05/03/2007    PCP: Bridgette Sluder, PA  REFERRING PROVIDER: Valdemar Rocky SAUNDERS, NP  REFERRING DIAG:  [phone] (ICD-10-CM) - Total  knee replacement status, left  D16.577J (ICD-10-CM) - Sprain of lateral collateral ligament of left knee, initial encounter    THERAPY DIAG:  Chronic pain of left knee  Muscle weakness (generalized)  Difficulty in walking, not elsewhere classified  Rationale for Evaluation and Treatment: Rehabilitation  ONSET DATE: 12/26/22 A long time  SUBJECTIVE:   SUBJECTIVE STATEMENT: 05/25/2024:  Pt reports continued pain which interferes with her sleep.  EVAL: Pt reports a chronic Hx of knee pain L>R with a TKA of the L completed 5/24. She notes her knees bother her when walking with them starting to be more painful and swollen the past few months. She also endorses low back pain. She feels like thet are now less swollen.   PERTINENT HISTORY: See above. High BMI.  PAIN:  Are you having pain? Yes: NPRS scale: 0/10   (8/10 right knee, 8/10 left side starting Monday morning)  Pain location: L knee, both sides of the knee Pain description: ache, sharp Aggravating factors: Increased time on feey Relieving factors: Lidocaine  patches  PRECAUTIONS: None  RED FLAGS: None   WEIGHT BEARING RESTRICTIONS: No  FALLS:  Has patient fallen in last 6 months? No  LIVING ENVIRONMENT: Lives with: lives alone Lives in: House/apartment Stairs: No Has following equipment at home: Single point cane and Environmental consultant - 4 wheeled  OCCUPATION: retired  PLOF: Independent with household mobility with device and Independent with community mobility with device  PATIENT GOALS: To walk better s rollator if possible and with less pain  NEXT MD VISIT: Not scheduled  OBJECTIVE:  Note: Objective measures were completed at Evaluation unless otherwise noted.  DIAGNOSTIC FINDINGS:  03/02/24 X-ray L Knee Radiographs of the left knee show stable alignment of total knee  arthroplasty hardware there is no complicating features.  There is  end-stage osteoarthritis present to the right knee.  With bone-on-bone   contact.   PATIENT SURVEYS:  LEFS: 31/80=39% 05/12/24: 45%   COGNITION: Overall cognitive status: Within functional limits for tasks assessed     SENSATION: WFL  EDEMA:  Swelling present  MUSCLE LENGTH: Hamstrings: Right: Tight deg; Left tight deg Debby test: Right Tight deg; Left Tight deg  POSTURE: increased lumbar lordosis and flexed trunk   PALPATION: TTP to the peri- L knee  LOWER EXTREMITY ROM:  Active ROM Right eval Left eval Right 03/31/24 Left 03/31/24 Left 05/19/24  Hip flexion       Hip extension       Hip abduction       Hip adduction       Hip internal rotation       Hip external rotation       Knee flexion 90 100 110 110 112  Knee extension 15 lacking 15 lacking     Ankle dorsiflexion       Ankle plantarflexion       Ankle inversion       Ankle eversion        (Blank rows = not tested)  LOWER EXTREMITY MMT:  MMT Right eval Left eval Rt 04/21/24 Lt 04/21/24  Hip flexion 3 3 3  pain 3 pain  Hip extension 2 2    Hip abduction 3 3 4 4   Hip adduction      Hip internal rotation      Hip external rotation 3 3 4 4   Knee flexion 4 4 4+ 4+  Knee extension 4 4 4+ 4+  Ankle dorsiflexion      Ankle plantarflexion      Ankle inversion      Ankle eversion       (Blank rows = not tested)   FUNCTIONAL TESTS:  5 times sit to stand: 29.5  c use of hands 2 minute walk test: TBA  03/23/24: 193 feet with SPC, pt with increased SOB due to exertion  05/12/24: 215 feet   GAIT: Distance walked: 100' Assistive device utilized: Environmental consultant - 4 wheeled Level of assistance: Modified independence Comments: Decreased pace and step length                                                                                                                    TREATMENT DATE:   TREATMENT 05/25/24:  Aquatic therapy at [facility]- [address] - therapeutic pool temp 90-92 degrees   Aquatic Therapy:  Water walking for warm up fwd/lat/bkwds  Hip abd Mini  squat HS curl Heel raise Step up bil UE support Lat Lateral walking at pool edge Stand march EOP Knee flexion stretch Step up fwd and lat  Pt requires the buoyancy of water for active assisted exercises with buoyancy supported for strengthening and AROM exercises. Hydrostatic pressure also supports joints by unweighting joint load by at least 50 % in 3-4 feet depth water. 80% in chest to neck deep water. Water will provide assistance with movement using the current and laminar flow while the buoyancy reduces weight bearing. Pt requires the viscosity of the water for resistance with strengthening exercises.  [facility] Adult PT Treatment:                                                DATE: 05/19/24 Nustep L6 x 10 min 2 MWT 159 ft with SPC IASTM along the R patellar and quad tendon MTPR along the R quad x 3 LAQ 4# 2 x 20, bil Bil marching 1 x 20 bil 4# Sit to stand 1 x 10 (cues on how proper mechanics) Seated low back stretch 2 x 10   [facility] Adult PT Treatment:                                                DATE: 05/12/24 Therapeutic Exercise: Nustep L5  x 10 minutes Seated 3# march 20 x 2  Seated LAQ 3# 20 x 2 each  Manual Therapy: STW posterior right knee   Therapeutic Activity: 2 MWT 215 feet  with SPC STS 5 x 1   [facility] Adult PT Treatment:                                                DATE: 05/10/24 Therapeutic Exercise/Activity: Nustep x 10 min 5xSTS LEFS Seated  3# march x 20  Seated 3# LAQ x 20 Seated blue band clam 20 x 2 Standing heel raise 10 x 2  TREATMENT 05/04/24:  Aquatic therapy at [facility]- [address] - therapeutic pool temp 90-92 degrees   Aquatic Therapy:  Water walking for warm up fwd/lat/bkwds  Alternating LAQ Flutter kicks Lateral flutter kicks Mini squat HS curl Heel raise Step up bil UE support Lat Lateral walking at pool edge Knee flexion stretch HS stretch Step up fwd and lat  Pt requires the buoyancy of water for active  assisted exercises with buoyancy supported for strengthening and AROM exercises. Hydrostatic pressure also supports joints by unweighting joint load by at least 50 % in 3-4 feet depth water. 80% in chest to neck deep water. Water will provide assistance with movement using the current and laminar flow while the buoyancy reduces weight bearing. Pt requires the viscosity of the water for resistance with strengthening exercises.   ASSESSMENT:  CLINICAL IMPRESSION:  05/25/2024:  Session today focused on hip and knee strengthening in the aquatic environment for use of buoyancy to offload joints and the viscosity of water as resistance during therapeutic exercise.  Pt with lower anxiety today.  Still limited by significant R knee pain but does demonstrate improved  gait pattern in gravity reduced environment.  Patient was able to tolerate all prescribed exercises in the aquatic environment with no adverse effects and reports no increase in pain at the end of the session. Patient continues to benefit from skilled PT services on land and aquatic based and should be progressed as able to improve functional independence.   EVAL: Patient is a 73 y.o. female who was seen today for physical therapy evaluation and treatment for  Z96.652 (ICD-10-CM) - Total knee replacement status, left  S83.422A (ICD-10-CM) - Sprain of lateral collateral ligament of left knee, initial encounter   Pt presents with decreased LE strength, AROM, and chronic knee pain bilat L>R which is negatively impacting the pt's quality and tolerance for functional mobility. Pt will benefit from skilled PT 2w6 to address impairments to optimize leg function  and mobility with less pain.   OBJECTIVE IMPAIRMENTS: decreased activity tolerance, decreased balance, decreased endurance, decreased mobility, difficulty walking, decreased ROM, decreased strength, and increased edema, and pain.  PERSONAL FACTORS: Age, Fitness, Past/current experiences, Time  since onset of injury/illness/exacerbation, and 1-2 comorbidities: high BMI, trunk postural changes are also affecting patient's functional outcome.   REHAB POTENTIAL: Good  CLINICAL DECISION MAKING: Evolving/moderate complexity  EVALUATION COMPLEXITY: Moderate   GOALS:  SHORT TERM GOALS: Target date: 04/08/24 Pt will be Ind in an initial HEP  Baseline: started Goal status: MET  LONG TERM GOALS: Target date: 07/01/24  Pt will be Ind in a final HEP to maintain achieved  LOF  Baseline:  Goal status: INITIAL  2.  Increase pt's bilat knee and hip strengths by 1/2 muscle grade to improve pt's function with daily activities Baseline:  04/21/24: see flow sheets Goal status: IMPROVING  3.  Increased pt's bilat knee flexion to 105d or greater to improve pt's to obtain sitting positions Baseline: L 100d, R 90d 04/21/24: L 105; R 105 Goal status: MET  4.  Improve 5xSTS by MCID of 5 and by MCID of 64ft as indication of improved functional mobility  Baseline: 5xSTS= 29.5, 193 with SPC 05/10/24: 21.8 sec x use of hands; 191 c SPC limited by knee pain and DOE Goal status: IMPROVED 5xSTS  5.  Pt will report 50% or greater improvement in her L knee pain for improved function and QOL Baseline: 4-8/10 05/03/24: Left knee is no longer painful, she is limited by right knee pain  05/10/24: L knee-50%; R 25% Goal status: IMPROVED  6.  Pt's LEFS score will improve by the the MCID to 49% or greater as indication of improved function  Baseline: 39% 05/10/24: 45% Goal status: IMPROVED    PLAN:  PT FREQUENCY: 2x/week  PT DURATION: 6 weeks  PLANNED INTERVENTIONS: 97164- PT Re-evaluation, 97110-Therapeutic exercises, 97530- Therapeutic activity, 97112- Neuromuscular re-education, 97535- Self Care, [id number]- Manual therapy, 97116- Gait training, [phone]- Vasopneumatic device, Patient/Family education, Balance training, Stair training, Taping, Joint mobilization, Cryotherapy, and Moist  heat  PLAN FOR NEXT SESSION: Update Cert to include AQUATICS: assess response to HEP; progress therex as indicated; use of modalities, and manual therapy.   Alison Whitehead E Petula Rotolo PT 05/25/24  3:41 PM

## 2024-05-27 ENCOUNTER — Encounter: Payer: Self-pay | Admitting: Physical Therapy

## 2024-05-27 ENCOUNTER — Ambulatory Visit: Payer: Self-pay | Admitting: Physical Therapy

## 2024-05-27 DIAGNOSIS — R2689 Other abnormalities of gait and mobility: Secondary | ICD-10-CM

## 2024-05-27 DIAGNOSIS — G8929 Other chronic pain: Secondary | ICD-10-CM

## 2024-05-27 DIAGNOSIS — M25562 Pain in left knee: Secondary | ICD-10-CM | POA: Diagnosis not present

## 2024-05-27 DIAGNOSIS — M6281 Muscle weakness (generalized): Secondary | ICD-10-CM

## 2024-05-27 DIAGNOSIS — R262 Difficulty in walking, not elsewhere classified: Secondary | ICD-10-CM

## 2024-05-27 NOTE — Therapy (Signed)
 OUTPATIENT PHYSICAL THERAPY LOWER EXTREMITY TREATMENT     Patient Name: Alison Whitehead MRN: [id number] DOB:Dec 01, 1950, 73 y.o., female Today's Date: 05/27/2024  END OF SESSION:  PT End of Session - 05/27/24 1147     Visit Number 18    Number of Visits 25    Date for Recertification  07/01/24    Authorization Type [employer] MEDICARE HMO/PPO    PT Start Time 1145    PT Stop Time 1230    PT Time Calculation (min) 45 min                 Past Medical History:  Diagnosis Date   Allergy    seasonal   Arthritis    CKD (chronic kidney disease)    DDD (degenerative disc disease)    Degenerative disc disease, lumbar    Hypertension    IBS (irritable bowel syndrome)    Osteopenia    Osteoporosis    osteopenia   Stroke (HCC) 09/2019   Past Surgical History:  Procedure Laterality Date   SHOULDER SURGERY Left    lipoma   TOTAL KNEE ARTHROPLASTY Left 12/26/2022   Procedure: LEFT TOTAL KNEE ARTHROPLASTY;  Surgeon: Harden Jerona GAILS, MD;  Location: [facility] OR;  Service: Orthopedics;  Laterality: Left;   Patient Active Problem List   Diagnosis Date Noted   Total knee replacement status, 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   Rotator cuff syndrome, right 01/11/2020   Labile blood glucose    Vascular headache    Small vessel disease, cerebrovascular    Prediabetes    AKI (acute kidney injury)    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   Acute cerebrovascular accident (CVA) (HCC) 09/30/2019   Unilateral primary osteoarthritis, left knee 03/17/2019   Unilateral primary osteoarthritis, right knee 03/17/2019   Bilateral primary osteoarthritis of knee 03/10/2019   INSOMNIA, CHRONIC 05/19/2007   SPONDYLOSIS, LUMBAR 05/19/2007   Essential hypertension 05/03/2007   ARTHRITIS, KNEE 05/03/2007    PCP: Bridgette Sluder, PA  REFERRING PROVIDER: Valdemar Rocky SAUNDERS, NP  REFERRING DIAG:  [phone] (ICD-10-CM) - Total knee replacement status, left  D16.577J (ICD-10-CM) - Sprain of lateral collateral ligament of  left knee, initial encounter    THERAPY DIAG:  Chronic pain of left knee  Muscle weakness (generalized)  Difficulty in walking, not elsewhere classified  Other abnormalities of gait and mobility  Rationale for Evaluation and Treatment: Rehabilitation  ONSET DATE: 12/26/22 A long time  SUBJECTIVE:   SUBJECTIVE STATEMENT: 05/27/2024:  Pt reports continued pain which interferes with her sleep.  EVAL: Pt reports a chronic Hx of knee pain L>R with a TKA of the L completed 5/24. She notes her knees bother her when walking with them starting to be more painful and swollen the past few months. She also endorses low back pain. She feels like thet are now less swollen.   PERTINENT HISTORY: See above. High BMI.  PAIN:  Are you having pain? Yes: NPRS scale: 0/10   (8/10 right knee, 8/10 left side starting Monday morning)  Pain location: L knee, both sides of the knee Pain description: ache, sharp Aggravating factors: Increased time on feey Relieving factors: Lidocaine  patches  PRECAUTIONS: None  RED FLAGS: None   WEIGHT BEARING RESTRICTIONS: No  FALLS:  Has patient fallen in last 6 months? No  LIVING ENVIRONMENT: Lives with: lives alone Lives in: House/apartment Stairs: No Has following equipment at home: Single point cane and Environmental consultant - 4 wheeled  OCCUPATION: retired  PLOF: Independent with household mobility with device  and Independent with community mobility with device  PATIENT GOALS: To walk better s rollator if possible and with less pain  NEXT MD VISIT: Not scheduled  OBJECTIVE:  Note: Objective measures were completed at Evaluation unless otherwise noted.  DIAGNOSTIC FINDINGS:  03/02/24 X-ray L Knee Radiographs of the left knee show stable alignment of total knee  arthroplasty hardware there is no complicating features.  There is  end-stage osteoarthritis present to the right knee.  With bone-on-bone  contact.   PATIENT SURVEYS:  LEFS: 31/80=39% 05/12/24:  45%   COGNITION: Overall cognitive status: Within functional limits for tasks assessed     SENSATION: WFL  EDEMA:  Swelling present  MUSCLE LENGTH: Hamstrings: Right: Tight deg; Left tight deg Debby test: Right Tight deg; Left Tight deg  POSTURE: increased lumbar lordosis and flexed trunk   PALPATION: TTP to the peri- L knee  LOWER EXTREMITY ROM:  Active ROM Right eval Left eval Right 03/31/24 Left 03/31/24 Left 05/19/24  Hip flexion       Hip extension       Hip abduction       Hip adduction       Hip internal rotation       Hip external rotation       Knee flexion 90 100 110 110 112  Knee extension 15 lacking 15 lacking     Ankle dorsiflexion       Ankle plantarflexion       Ankle inversion       Ankle eversion        (Blank rows = not tested)  LOWER EXTREMITY MMT:  MMT Right eval Left eval Rt 04/21/24 Lt 04/21/24  Hip flexion 3 3 3  pain 3 pain  Hip extension 2 2    Hip abduction 3 3 4 4   Hip adduction      Hip internal rotation      Hip external rotation 3 3 4 4   Knee flexion 4 4 4+ 4+  Knee extension 4 4 4+ 4+  Ankle dorsiflexion      Ankle plantarflexion      Ankle inversion      Ankle eversion       (Blank rows = not tested)   FUNCTIONAL TESTS:  5 times sit to stand: 29.5  c use of hands 2 minute walk test: TBA  03/23/24: 193 feet with SPC, pt with increased SOB due to exertion  05/12/24: 215 feet   GAIT: Distance walked: 100' Assistive device utilized: Environmental consultant - 4 wheeled Level of assistance: Modified independence Comments: Decreased pace and step length                                                                                                                    TREATMENT DATE:  [facility] Adult PT Treatment:  DATE: 05/27/24  Nustep L6 x 10 min LAQ 4# 2 x 20, bil Bil marching 1 x 20 bil 4# Bridge x 10 SLR x 10 each  Side hip abduction  x 20 each  Seated low back stretch 2 x 10   with ball      TREATMENT 05/25/24:  Aquatic therapy at [facility]- [address] - therapeutic pool temp 90-92 degrees   Aquatic Therapy:  Water walking for warm up fwd/lat/bkwds  Hip abd Mini squat HS curl Heel raise Step up bil UE support Lat Lateral walking at pool edge Stand march EOP Knee flexion stretch Step up fwd and lat  Pt requires the buoyancy of water for active assisted exercises with buoyancy supported for strengthening and AROM exercises. Hydrostatic pressure also supports joints by unweighting joint load by at least 50 % in 3-4 feet depth water. 80% in chest to neck deep water. Water will provide assistance with movement using the current and laminar flow while the buoyancy reduces weight bearing. Pt requires the viscosity of the water for resistance with strengthening exercises.  [facility] Adult PT Treatment:                                                DATE: 05/19/24 Nustep L6 x 10 min 2 MWT 159 ft with SPC IASTM along the R patellar and quad tendon MTPR along the R quad x 3 LAQ 4# 2 x 20, bil Bil marching 1 x 20 bil 4# Sit to stand 1 x 10 (cues on how proper mechanics) Seated low back stretch 2 x 10   [facility] Adult PT Treatment:                                                DATE: 05/12/24 Therapeutic Exercise: Nustep L5  x 10 minutes Seated 3# march 20 x 2  Seated LAQ 3# 20 x 2 each  Manual Therapy: STW posterior right knee   Therapeutic Activity: 2 MWT 215 feet  with SPC STS 5 x 1   [facility] Adult PT Treatment:                                                DATE: 05/10/24 Therapeutic Exercise/Activity: Nustep x 10 min 5xSTS LEFS Seated  3# march x 20  Seated 3# LAQ x 20 Seated blue band clam 20 x 2 Standing heel raise 10 x 2  TREATMENT 05/04/24:  Aquatic therapy at [facility]- [address] - therapeutic pool temp 90-92 degrees   Aquatic Therapy:  Water walking for warm up fwd/lat/bkwds  Alternating LAQ Flutter kicks Lateral  flutter kicks Mini squat HS curl Heel raise Step up bil UE support Lat Lateral walking at pool edge Knee flexion stretch HS stretch Step up fwd and lat  Pt requires the buoyancy of water for active assisted exercises with buoyancy supported for strengthening and AROM exercises. Hydrostatic pressure also supports joints by unweighting joint load by at least 50 % in 3-4 feet depth water. 80% in chest to neck deep water. Water will provide assistance with movement  using the current and laminar flow while the buoyancy reduces weight bearing. Pt requires the viscosity of the water for resistance with strengthening exercises.   ASSESSMENT:  CLINICAL IMPRESSION:  05/27/2024:  Pt reports that she liked aquatic therapy better the last time she went, she was not as apprehensive. Today continued to strength LE to improved activity tolerance.   EVAL: Patient is a 73 y.o. female who was seen today for physical therapy evaluation and treatment for  Z96.652 (ICD-10-CM) - Total knee replacement status, left  S83.422A (ICD-10-CM) - Sprain of lateral collateral ligament of left knee, initial encounter   Pt presents with decreased LE strength, AROM, and chronic knee pain bilat L>R which is negatively impacting the pt's quality and tolerance for functional mobility. Pt will benefit from skilled PT 2w6 to address impairments to optimize leg function  and mobility with less pain.   OBJECTIVE IMPAIRMENTS: decreased activity tolerance, decreased balance, decreased endurance, decreased mobility, difficulty walking, decreased ROM, decreased strength, and increased edema, and pain.  PERSONAL FACTORS: Age, Fitness, Past/current experiences, Time since onset of injury/illness/exacerbation, and 1-2 comorbidities: high BMI, trunk postural changes are also affecting patient's functional outcome.   REHAB POTENTIAL: Good  CLINICAL DECISION MAKING: Evolving/moderate complexity  EVALUATION COMPLEXITY:  Moderate   GOALS:  SHORT TERM GOALS: Target date: 04/08/24 Pt will be Ind in an initial HEP  Baseline: started Goal status: MET  LONG TERM GOALS: Target date: 07/01/24  Pt will be Ind in a final HEP to maintain achieved  LOF  Baseline:  Goal status: INITIAL  2.  Increase pt's bilat knee and hip strengths by 1/2 muscle grade to improve pt's function with daily activities Baseline:  04/21/24: see flow sheets Goal status: IMPROVING  3.  Increased pt's bilat knee flexion to 105d or greater to improve pt's to obtain sitting positions Baseline: L 100d, R 90d 04/21/24: L 105; R 105 Goal status: MET  4.  Improve 5xSTS by MCID of 5 and by MCID of 26ft as indication of improved functional mobility  Baseline: 5xSTS= 29.5, 193 with SPC 05/10/24: 21.8 sec x use of hands; 191 c SPC limited by knee pain and DOE Goal status: IMPROVED 5xSTS  5.  Pt will report 50% or greater improvement in her L knee pain for improved function and QOL Baseline: 4-8/10 05/03/24: Left knee is no longer painful, she is limited by right knee pain  05/10/24: L knee-50%; R 25% Goal status: IMPROVED  6.  Pt's LEFS score will improve by the the MCID to 49% or greater as indication of improved function  Baseline: 39% 05/10/24: 45% Goal status: IMPROVED    PLAN:  PT FREQUENCY: 2x/week  PT DURATION: 6 weeks  PLANNED INTERVENTIONS: 97164- PT Re-evaluation, 97110-Therapeutic exercises, 97530- Therapeutic activity, 97112- Neuromuscular re-education, 97535- Self Care, [id number]- Manual therapy, 97116- Gait training, [phone]- Vasopneumatic device, Patient/Family education, Balance training, Stair training, Taping, Joint mobilization, Cryotherapy, and Moist heat  PLAN FOR NEXT SESSION: : assess response to HEP; progress therex as indicated; use of modalities, and manual therapy.   Harlene Persons, PTA 05/27/24 12:17 PM Phone: [phone] Fax: [phone]

## 2024-05-31 ENCOUNTER — Ambulatory Visit: Payer: Self-pay | Admitting: Physical Therapy

## 2024-06-01 NOTE — Therapy (Addendum)
 OUTPATIENT PHYSICAL THERAPY LOWER EXTREMITY TREATMENT     Patient Name: Alison Whitehead MRN: [id number] DOB:[birth year], 73 y.o., female Today's Date: 06/02/2024  END OF SESSION:  PT End of Session - 06/02/24 1102     Visit Number 19    Number of Visits 25    Date for Recertification  07/01/24    Authorization Type [employer] MEDICARE HMO/PPO    PT Start Time 1105    PT Stop Time 1145    PT Time Calculation (min) 40 min    Activity Tolerance Patient tolerated treatment well;Patient limited by pain    Behavior During Therapy [facility] for tasks assessed/performed                  Past Medical History:  Diagnosis Date   Allergy    seasonal   Arthritis    CKD (chronic kidney disease)    DDD (degenerative disc disease)    Degenerative disc disease, lumbar    Hypertension    IBS (irritable bowel syndrome)    Osteopenia    Osteoporosis    osteopenia   Stroke (HCC) 09/2019   Past Surgical History:  Procedure Laterality Date   SHOULDER SURGERY Left    lipoma   TOTAL KNEE ARTHROPLASTY Left 12/26/2022   Procedure: LEFT TOTAL KNEE ARTHROPLASTY;  Surgeon: Harden Jerona GAILS, MD;  Location: [facility] OR;  Service: Orthopedics;  Laterality: Left;   Patient Active Problem List   Diagnosis Date Noted   Total knee replacement status, 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   Rotator cuff syndrome, right 01/11/2020   Labile blood glucose    Vascular headache    Small vessel disease, cerebrovascular    Prediabetes    AKI (acute kidney injury)    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   Acute cerebrovascular accident (CVA) (HCC) 09/30/2019   Unilateral primary osteoarthritis, left knee 03/17/2019   Unilateral primary osteoarthritis, right knee 03/17/2019   Bilateral primary osteoarthritis of knee 03/10/2019   INSOMNIA, CHRONIC 05/19/2007   SPONDYLOSIS, LUMBAR 05/19/2007   Essential hypertension 05/03/2007   ARTHRITIS, KNEE 05/03/2007    PCP: Bridgette Sluder, PA  REFERRING PROVIDER: Valdemar Rocky SAUNDERS,  NP  REFERRING DIAG:  [phone] (ICD-10-CM) - Total knee replacement status, left  D16.577J (ICD-10-CM) - Sprain of lateral collateral ligament of left knee, initial encounter    THERAPY DIAG:  Chronic pain of left knee  Muscle weakness (generalized)  Difficulty in walking, not elsewhere classified  Rationale for Evaluation and Treatment: Rehabilitation  ONSET DATE: 12/26/22 A long time  SUBJECTIVE:   SUBJECTIVE STATEMENT: 06/02/2024:  Pt reports she tried aquatics again and enjoyed better than her first visit.   EVAL: Pt reports a chronic Hx of knee pain L>R with a TKA of the L completed 5/24. She notes her knees bother her when walking with them starting to be more painful and swollen the past few months. She also endorses low back pain. She feels like thet are now less swollen.   PERTINENT HISTORY: See above. High BMI.  PAIN:  Are you having pain? Yes: NPRS scale: 06/02/2024 8/10   (8/10 right knee, 8/10 left side starting Monday morning)  Pain location: L knee, both sides of the knee Pain description: ache, sharp Aggravating factors: Increased time on feey Relieving factors: Lidocaine  patches  PRECAUTIONS: None  RED FLAGS: None   WEIGHT BEARING RESTRICTIONS: No  FALLS:  Has patient fallen in last 6 months? No  LIVING ENVIRONMENT: Lives with: lives alone Lives in: House/apartment Stairs: No Has following  equipment at home: Single point cane and Walker - 4 wheeled  OCCUPATION: retired  PLOF: Independent with household mobility with device and Independent with community mobility with device  PATIENT GOALS: To walk better s rollator if possible and with less pain  NEXT MD VISIT: Not scheduled  OBJECTIVE:  Note: Objective measures were completed at Evaluation unless otherwise noted.  DIAGNOSTIC FINDINGS:  03/02/24 X-ray L Knee Radiographs of the left knee show stable alignment of total knee  arthroplasty hardware there is no complicating features.  There is   end-stage osteoarthritis present to the right knee.  With bone-on-bone  contact.   PATIENT SURVEYS:  LEFS: 31/80=39% 05/12/24: 45%   COGNITION: Overall cognitive status: Within functional limits for tasks assessed     SENSATION: WFL  EDEMA:  Swelling present  MUSCLE LENGTH: Hamstrings: Right: Tight deg; Left tight deg Thomas test: Right Tight deg; Left Tight deg  POSTURE: increased lumbar lordosis and flexed trunk   PALPATION: TTP to the peri- L knee  LOWER EXTREMITY ROM:  Active ROM Right eval Left eval Right 03/31/24 Left 03/31/24 Left 05/19/24 RT 06/02/24  Hip flexion        Hip extension        Hip abduction        Hip adduction        Hip internal rotation        Hip external rotation        Knee flexion 90 100 110 110 112 112d/115d  Knee extension 15 lacking 15 lacking      Ankle dorsiflexion        Ankle plantarflexion        Ankle inversion        Ankle eversion         (Blank rows = not tested)  LOWER EXTREMITY MMT:  MMT Right eval Left eval Rt 04/21/24 Lt 04/21/24 Rt Lt  Hip flexion 3 3 3  pain 3 pain 4+ 4+  Hip extension 2 2      Hip abduction 3 3 4 4 4 4   Hip adduction        Hip internal rotation        Hip external rotation 3 3 4 4  4+ 4+  Knee flexion 4 4 4+ 4+ 4+ 5  Knee extension 4 4 4+ 4+ 4+ 5  Ankle dorsiflexion        Ankle plantarflexion        Ankle inversion        Ankle eversion         (Blank rows = not tested)   FUNCTIONAL TESTS:  5 times sit to stand: 29.5  c use of hands 2 minute walk test: TBA  03/23/24: 193 feet with SPC, pt with increased SOB due to exertion  05/12/24: 215 feet   GAIT: Distance walked: 100' Assistive device utilized: Environmental Consultant - 4 wheeled Level of assistance: Modified independence Comments: Decreased pace and step length  TREATMENT DATE:  [facility] Adult PT Treatment:                                                 DATE: 06/02/24 Therapeutic Exercise/Activities: Nustep L6 x 10 min ROM MMT LAQ 5# 2 x 20, bil STSx 10 SLR x 15 each  Side hip abduction  x 20 each Seated low back stretch 2 x 10  with ball   5xSTS  [facility] Adult PT Treatment:                                                DATE: 05/27/24 Nustep L6 x 10 min LAQ 4# 2 x 20, bil Bil marching 1 x 20 bil 4# Bridge x 10 SLR x 10 each  Side hip abduction  x 20 each  Seated low back stretch 2 x 10  with ball   TREATMENT 05/25/24:  Aquatic therapy at [facility]- [address] - therapeutic pool temp 90-92 degrees   Aquatic Therapy:  Water walking for warm up fwd/lat/bkwds  Hip abd Mini squat HS curl Heel raise Step up bil UE support Lat Lateral walking at pool edge Stand march EOP Knee flexion stretch Step up fwd and lat  Pt requires the buoyancy of water for active assisted exercises with buoyancy supported for strengthening and AROM exercises. Hydrostatic pressure also supports joints by unweighting joint load by at least 50 % in 3-4 feet depth water. 80% in chest to neck deep water. Water will provide assistance with movement using the current and laminar flow while the buoyancy reduces weight bearing. Pt requires the viscosity of the water for resistance with strengthening exercises.  [facility] Adult PT Treatment:                                                DATE: 05/19/24 Nustep L6 x 10 min 2 MWT 159 ft with SPC IASTM along the R patellar and quad tendon MTPR along the R quad x 3 LAQ 4# 2 x 20, bil Bil marching 1 x 20 bil 4# Sit to stand 1 x 10 (cues on how proper mechanics) Seated low back stretch 2 x 10   ASSESSMENT:  CLINICAL IMPRESSION:  06/02/2024:  PT was completed for LE strengthening and ROM. Reassessed strength, ROM, and 5xSTS, and all measures have continued to improve since the last time assessed. R knee pain is the pt's greatest limitation re: her tolerance to functional  activities. Pt will continue to benefit from skilled PT to address impairments for improved function.   EVAL: Patient is a 73 y.o. female who was seen today for physical therapy evaluation and treatment for  Z96.652 (ICD-10-CM) - Total knee replacement status, left  S83.422A (ICD-10-CM) - Sprain of lateral collateral ligament of left knee, initial encounter   Pt presents with decreased LE strength, AROM, and chronic knee pain bilat L>R which is negatively impacting the pt's quality and tolerance for functional mobility. Pt will benefit from skilled PT 2w6 to address impairments to optimize leg function  and mobility with less pain.   OBJECTIVE IMPAIRMENTS:  decreased activity tolerance, decreased balance, decreased endurance, decreased mobility, difficulty walking, decreased ROM, decreased strength, and increased edema, and pain.  PERSONAL FACTORS: Age, Fitness, Past/current experiences, Time since onset of injury/illness/exacerbation, and 1-2 comorbidities: high BMI, trunk postural changes are also affecting patient's functional outcome.   REHAB POTENTIAL: Good  CLINICAL DECISION MAKING: Evolving/moderate complexity  EVALUATION COMPLEXITY: Moderate   GOALS:  SHORT TERM GOALS: Target date: 04/08/24 Pt will be Ind in an initial HEP  Baseline: started Goal status: MET  LONG TERM GOALS: Target date: 07/01/24  Pt will be Ind in a final HEP to maintain achieved  LOF  Baseline:  Goal status: INITIAL  2.  Increase pt's bilat knee and hip strengths by 1/2 muscle grade to improve pt's function with daily activities Baseline:  04/21/24: see flow sheets 06/02/24: see flow sheet Goal status: MET, continued improvement  3.  Increased pt's bilat knee flexion to 105d or greater to improve pt's to obtain sitting positions Baseline: L 100d, R 90d 04/21/24: L 105; R 105 06/02/24: R 115; L 112 Goal status: MET, continued improvement  4.  Improve 5xSTS by MCID of 5 and by MCID of 59ft as  indication of improved functional mobility  Baseline: 5xSTS= 29.5, 193 with SPC 05/10/24: 21.8 sec x use of hands; 191 c SPC limited by knee pain and DOE 06/02/24: 18.8 Goal status: MET, continued improvement  5.  Pt will report 50% or greater improvement in her L knee pain for improved function and QOL Baseline: 4-8/10 05/03/24: Left knee is no longer painful, she is limited by right knee pain  05/10/24: L knee-50%; R 25% Goal status: IMPROVED  6.  Pt's LEFS score will improve by the the MCID to 49% or greater as indication of improved function  Baseline: 39% 05/10/24: 45% Goal status: IMPROVED    PLAN:  PT FREQUENCY: 2x/week  PT DURATION: 6 weeks  PLANNED INTERVENTIONS: 97164- PT Re-evaluation, 97110-Therapeutic exercises, 97530- Therapeutic activity, 97112- Neuromuscular re-education, 97535- Self Care, [id number]- Manual therapy, 97116- Gait training, [phone]- Vasopneumatic device, Patient/Family education, Balance training, Stair training, Taping, Joint mobilization, Cryotherapy, and Moist heat, Aquatics  PLAN FOR NEXT SESSION: : assess response to HEP; progress therex as indicated; use of modalities, and manual therapy.   Fathima Bartl MS, PT 06/02/24 3:45 PM  Dasie Daft MS, PT 06/08/24 7:57 AM

## 2024-06-02 ENCOUNTER — Ambulatory Visit: Payer: Self-pay

## 2024-06-02 DIAGNOSIS — R262 Difficulty in walking, not elsewhere classified: Secondary | ICD-10-CM

## 2024-06-02 DIAGNOSIS — G8929 Other chronic pain: Secondary | ICD-10-CM

## 2024-06-02 DIAGNOSIS — M6281 Muscle weakness (generalized): Secondary | ICD-10-CM

## 2024-06-02 DIAGNOSIS — M25562 Pain in left knee: Secondary | ICD-10-CM | POA: Diagnosis not present

## 2024-06-06 ENCOUNTER — Encounter: Payer: Self-pay | Admitting: Radiology

## 2024-06-07 ENCOUNTER — Ambulatory Visit: Payer: Self-pay

## 2024-06-08 ENCOUNTER — Encounter: Payer: Self-pay | Admitting: Physical Therapy

## 2024-06-08 ENCOUNTER — Ambulatory Visit: Payer: Self-pay | Attending: Family | Admitting: Physical Therapy

## 2024-06-08 DIAGNOSIS — M6281 Muscle weakness (generalized): Secondary | ICD-10-CM | POA: Insufficient documentation

## 2024-06-08 DIAGNOSIS — M25562 Pain in left knee: Secondary | ICD-10-CM | POA: Insufficient documentation

## 2024-06-08 DIAGNOSIS — G8929 Other chronic pain: Secondary | ICD-10-CM | POA: Diagnosis present

## 2024-06-08 DIAGNOSIS — R2689 Other abnormalities of gait and mobility: Secondary | ICD-10-CM | POA: Insufficient documentation

## 2024-06-08 DIAGNOSIS — R262 Difficulty in walking, not elsewhere classified: Secondary | ICD-10-CM | POA: Insufficient documentation

## 2024-06-08 NOTE — Therapy (Signed)
 OUTPATIENT PHYSICAL THERAPY LOWER EXTREMITY TREATMENT     Patient Name: Alison Whitehead MRN: [id number] DOB:1950-12-23, 73 y.o., female Today's Date: 06/08/2024  END OF SESSION:  PT End of Session - 06/08/24 1424     Visit Number 20    Number of Visits 25    Date for Recertification  07/01/24    Authorization Type [employer] MEDICARE HMO/PPO    PT Start Time 1415    PT Stop Time 1457    PT Time Calculation (min) 42 min    Activity Tolerance Patient tolerated treatment well;Patient limited by pain    Behavior During Therapy [facility] for tasks assessed/performed                   Past Medical History:  Diagnosis Date   Allergy    seasonal   Arthritis    CKD (chronic kidney disease)    DDD (degenerative disc disease)    Degenerative disc disease, lumbar    Hypertension    IBS (irritable bowel syndrome)    Osteopenia    Osteoporosis    osteopenia   Stroke (HCC) 09/2019   Past Surgical History:  Procedure Laterality Date   SHOULDER SURGERY Left    lipoma   TOTAL KNEE ARTHROPLASTY Left 12/26/2022   Procedure: LEFT TOTAL KNEE ARTHROPLASTY;  Surgeon: Harden Jerona GAILS, MD;  Location: [facility] OR;  Service: Orthopedics;  Laterality: Left;   Patient Active Problem List   Diagnosis Date Noted   Total knee replacement status, 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   Rotator cuff syndrome, right 01/11/2020   Labile blood glucose    Vascular headache    Small vessel disease, cerebrovascular    Prediabetes    AKI (acute kidney injury)    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   Acute cerebrovascular accident (CVA) (HCC) 09/30/2019   Unilateral primary osteoarthritis, left knee 03/17/2019   Unilateral primary osteoarthritis, right knee 03/17/2019   Bilateral primary osteoarthritis of knee 03/10/2019   INSOMNIA, CHRONIC 05/19/2007   SPONDYLOSIS, LUMBAR 05/19/2007   Essential hypertension 05/03/2007   ARTHRITIS, KNEE 05/03/2007    PCP: Bridgette Sluder, PA  REFERRING PROVIDER: Valdemar Rocky SAUNDERS,  NP  REFERRING DIAG:  [phone] (ICD-10-CM) - Total knee replacement status, left  D16.577J (ICD-10-CM) - Sprain of lateral collateral ligament of left knee, initial encounter    THERAPY DIAG:  Chronic pain of left knee  Muscle weakness (generalized)  Difficulty in walking, not elsewhere classified  Other abnormalities of gait and mobility  Rationale for Evaluation and Treatment: Rehabilitation  ONSET DATE: 12/26/22 A long time  SUBJECTIVE:   SUBJECTIVE STATEMENT: 06/08/2024:  Pt reports that she feels an improvement in her walking and strength.  EVAL: Pt reports a chronic Hx of knee pain L>R with a TKA of the L completed 5/24. She notes her knees bother her when walking with them starting to be more painful and swollen the past few months. She also endorses low back pain. She feels like thet are now less swollen.   PERTINENT HISTORY: See above. High BMI.  PAIN:  Are you having pain? Yes: NPRS scale: 06/08/2024 8/10   (8/10 right knee, 8/10 left side starting Monday morning)  Pain location: L knee, both sides of the knee Pain description: ache, sharp Aggravating factors: Increased time on feey Relieving factors: Lidocaine  patches  PRECAUTIONS: None  RED FLAGS: None   WEIGHT BEARING RESTRICTIONS: No  FALLS:  Has patient fallen in last 6 months? No  LIVING ENVIRONMENT: Lives with: lives alone Lives  in: House/apartment Stairs: No Has following equipment at home: Single point cane and Walker - 4 wheeled  OCCUPATION: retired  PLOF: Independent with household mobility with device and Independent with community mobility with device  PATIENT GOALS: To walk better s rollator if possible and with less pain  NEXT MD VISIT: Not scheduled  OBJECTIVE:  Note: Objective measures were completed at Evaluation unless otherwise noted.  DIAGNOSTIC FINDINGS:  03/02/24 X-ray L Knee Radiographs of the left knee show stable alignment of total knee  arthroplasty hardware there is no  complicating features.  There is  end-stage osteoarthritis present to the right knee.  With bone-on-bone  contact.   PATIENT SURVEYS:  LEFS: 31/80=39% 05/12/24: 45%   COGNITION: Overall cognitive status: Within functional limits for tasks assessed     SENSATION: WFL  EDEMA:  Swelling present  MUSCLE LENGTH: Hamstrings: Right: Tight deg; Left tight deg Thomas test: Right Tight deg; Left Tight deg  POSTURE: increased lumbar lordosis and flexed trunk   PALPATION: TTP to the peri- L knee  LOWER EXTREMITY ROM:  Active ROM Right eval Left eval Right 03/31/24 Left 03/31/24 Left 05/19/24 RT 06/02/24  Hip flexion        Hip extension        Hip abduction        Hip adduction        Hip internal rotation        Hip external rotation        Knee flexion 90 100 110 110 112 112d/115d  Knee extension 15 lacking 15 lacking      Ankle dorsiflexion        Ankle plantarflexion        Ankle inversion        Ankle eversion         (Blank rows = not tested)  LOWER EXTREMITY MMT:  MMT Right eval Left eval Rt 04/21/24 Lt 04/21/24 Rt Lt  Hip flexion 3 3 3  pain 3 pain 4+ 4+  Hip extension 2 2      Hip abduction 3 3 4 4 4 4   Hip adduction        Hip internal rotation        Hip external rotation 3 3 4 4  4+ 4+  Knee flexion 4 4 4+ 4+ 4+ 5  Knee extension 4 4 4+ 4+ 4+ 5  Ankle dorsiflexion        Ankle plantarflexion        Ankle inversion        Ankle eversion         (Blank rows = not tested)   FUNCTIONAL TESTS:  5 times sit to stand: 29.5  c use of hands 2 minute walk test: TBA  03/23/24: 193 feet with SPC, pt with increased SOB due to exertion  05/12/24: 215 feet   GAIT: Distance walked: 100' Assistive device utilized: Environmental Consultant - 4 wheeled Level of assistance: Modified independence Comments: Decreased pace and step length  TREATMENT DATE:    TREATMENT 06/08/24:  Aquatic therapy at [facility]- [address] - therapeutic pool temp 90-92 degrees   Aquatic Therapy:  Water walking for warm up fwd/lat/bkwds  Hip abd Mini squat HS curl Heel raise Step up bil UE support Lat Lateral walking at pool edge Stand march EOP Knee flexion stretch LAQ   Pt requires the buoyancy of water for active assisted exercises with buoyancy supported for strengthening and AROM exercises. Hydrostatic pressure also supports joints by unweighting joint load by at least 50 % in 3-4 feet depth water. 80% in chest to neck deep water. Water will provide assistance with movement using the current and laminar flow while the buoyancy reduces weight bearing. Pt requires the viscosity of the water for resistance with strengthening exercises.  [facility] Adult PT Treatment:                                                DATE: 06/02/24 Therapeutic Exercise/Activities: Nustep L6 x 10 min ROM MMT LAQ 5# 2 x 20, bil STSx 10 SLR x 15 each  Side hip abduction  x 20 each Seated low back stretch 2 x 10  with ball   5xSTS  [facility] Adult PT Treatment:                                                DATE: 05/27/24 Nustep L6 x 10 min LAQ 4# 2 x 20, bil Bil marching 1 x 20 bil 4# Bridge x 10 SLR x 10 each  Side hip abduction  x 20 each  Seated low back stretch 2 x 10  with ball   TREATMENT 05/25/24:  Aquatic therapy at [facility]- [address] - therapeutic pool temp 90-92 degrees   Aquatic Therapy:  Water walking for warm up fwd/lat/bkwds  Hip abd Mini squat HS curl Heel raise Step up bil UE support Lat Lateral walking at pool edge Stand march EOP Knee flexion stretch Step up fwd and lat  Pt requires the buoyancy of water for active assisted exercises with buoyancy supported for strengthening and AROM exercises. Hydrostatic pressure also supports joints by unweighting joint load by at least 50 % in 3-4 feet depth water. 80% in chest to  neck deep water. Water will provide assistance with movement using the current and laminar flow while the buoyancy reduces weight bearing. Pt requires the viscosity of the water for resistance with strengthening exercises.  [facility] Adult PT Treatment:                                                DATE: 05/19/24 Nustep L6 x 10 min 2 MWT 159 ft with SPC IASTM along the R patellar and quad tendon MTPR along the R quad x 3 LAQ 4# 2 x 20, bil Bil marching 1 x 20 bil 4# Sit to stand 1 x 10 (cues on how proper mechanics) Seated low back stretch 2 x 10   ASSESSMENT:  CLINICAL IMPRESSION:  06/08/2024:  Session today focused on knee strengthening and general activity tolerance in the aquatic environment for  use of buoyancy to offload joints and the viscosity of water as resistance during therapeutic exercise.  Pt with reports of increased L knee pain over the last several days with no specific cause.  Able to add in LAQ which pt reports is better tolerated vs land.  Pt able to complete multiple laps with lateral walking today with no issue.  Stopped step ups with L LE d/t pain.  Patient was able to tolerate all prescribed exercises in the aquatic environment with no adverse effects and reports no increase in pain at the end of the session. Patient continues to benefit from skilled PT services on land and aquatic based and should be progressed as able to improve functional independence.    EVAL: Patient is a 73 y.o. female who was seen today for physical therapy evaluation and treatment for  Z96.652 (ICD-10-CM) - Total knee replacement status, left  S83.422A (ICD-10-CM) - Sprain of lateral collateral ligament of left knee, initial encounter   Pt presents with decreased LE strength, AROM, and chronic knee pain bilat L>R which is negatively impacting the pt's quality and tolerance for functional mobility. Pt will benefit from skilled PT 2w6 to address impairments to optimize leg function  and mobility with less  pain.   OBJECTIVE IMPAIRMENTS: decreased activity tolerance, decreased balance, decreased endurance, decreased mobility, difficulty walking, decreased ROM, decreased strength, and increased edema, and pain.  PERSONAL FACTORS: Age, Fitness, Past/current experiences, Time since onset of injury/illness/exacerbation, and 1-2 comorbidities: high BMI, trunk postural changes are also affecting patient's functional outcome.   REHAB POTENTIAL: Good  CLINICAL DECISION MAKING: Evolving/moderate complexity  EVALUATION COMPLEXITY: Moderate   GOALS:  SHORT TERM GOALS: Target date: 04/08/24 Pt will be Ind in an initial HEP  Baseline: started Goal status: MET  LONG TERM GOALS: Target date: 07/01/24  Pt will be Ind in a final HEP to maintain achieved  LOF  Baseline:  Goal status: INITIAL  2.  Increase pt's bilat knee and hip strengths by 1/2 muscle grade to improve pt's function with daily activities Baseline:  04/21/24: see flow sheets 06/02/24: see flow sheet Goal status: MET, continued improvement  3.  Increased pt's bilat knee flexion to 105d or greater to improve pt's to obtain sitting positions Baseline: L 100d, R 90d 04/21/24: L 105; R 105 06/02/24: R 115; L 112 Goal status: MET, continued improvement  4.  Improve 5xSTS by MCID of 5 and by MCID of 11ft as indication of improved functional mobility  Baseline: 5xSTS= 29.5, 193 with SPC 05/10/24: 21.8 sec x use of hands; 191 c SPC limited by knee pain and DOE 06/02/24: 18.8 Goal status: MET, continued improvement  5.  Pt will report 50% or greater improvement in her L knee pain for improved function and QOL Baseline: 4-8/10 05/03/24: Left knee is no longer painful, she is limited by right knee pain  05/10/24: L knee-50%; R 25% Goal status: IMPROVED  6.  Pt's LEFS score will improve by the the MCID to 49% or greater as indication of improved function  Baseline: 39% 05/10/24: 45% Goal status: IMPROVED    PLAN:  PT  FREQUENCY: 2x/week  PT DURATION: 6 weeks  PLANNED INTERVENTIONS: 97164- PT Re-evaluation, 97110-Therapeutic exercises, 97530- Therapeutic activity, 97112- Neuromuscular re-education, 97535- Self Care, [id number]- Manual therapy, 97116- Gait training, [phone]- Vasopneumatic device, Patient/Family education, Balance training, Stair training, Taping, Joint mobilization, Cryotherapy, and Moist heat, Aquatics  PLAN FOR NEXT SESSION: : assess response to HEP; progress therex as indicated;  use of modalities, and manual therapy

## 2024-06-08 NOTE — Addendum Note (Signed)
 Addended by: JOLINDA DAWN on: 06/08/2024 07:59 AM   Modules accepted: Orders

## 2024-06-09 ENCOUNTER — Ambulatory Visit: Payer: Self-pay | Admitting: Physical Therapy

## 2024-06-09 DIAGNOSIS — G8929 Other chronic pain: Secondary | ICD-10-CM

## 2024-06-09 DIAGNOSIS — M25562 Pain in left knee: Secondary | ICD-10-CM | POA: Diagnosis not present

## 2024-06-09 DIAGNOSIS — R262 Difficulty in walking, not elsewhere classified: Secondary | ICD-10-CM

## 2024-06-09 DIAGNOSIS — M6281 Muscle weakness (generalized): Secondary | ICD-10-CM

## 2024-06-09 NOTE — Therapy (Signed)
 OUTPATIENT PHYSICAL THERAPY LOWER EXTREMITY TREATMENT     Patient Name: Alison Whitehead MRN: [id number] DOB:03-Nov-1950, 73 y.o., female Today's Date: 06/09/2024  END OF SESSION:  PT End of Session - 06/09/24 1105     Visit Number 21    Number of Visits 25    Date for Recertification  07/01/24    Authorization Type [employer] MEDICARE HMO/PPO    PT Start Time 1102    PT Stop Time 1145    PT Time Calculation (min) 43 min                   Past Medical History:  Diagnosis Date   Allergy    seasonal   Arthritis    CKD (chronic kidney disease)    DDD (degenerative disc disease)    Degenerative disc disease, lumbar    Hypertension    IBS (irritable bowel syndrome)    Osteopenia    Osteoporosis    osteopenia   Stroke (HCC) 09/2019   Past Surgical History:  Procedure Laterality Date   SHOULDER SURGERY Left    lipoma   TOTAL KNEE ARTHROPLASTY Left 12/26/2022   Procedure: LEFT TOTAL KNEE ARTHROPLASTY;  Surgeon: Harden Jerona GAILS, MD;  Location: [facility] OR;  Service: Orthopedics;  Laterality: Left;   Patient Active Problem List   Diagnosis Date Noted   Total knee replacement status, 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   Rotator cuff syndrome, right 01/11/2020   Labile blood glucose    Vascular headache    Small vessel disease, cerebrovascular    Prediabetes    AKI (acute kidney injury)    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   Acute cerebrovascular accident (CVA) (HCC) 09/30/2019   Unilateral primary osteoarthritis, left knee 03/17/2019   Unilateral primary osteoarthritis, right knee 03/17/2019   Bilateral primary osteoarthritis of knee 03/10/2019   INSOMNIA, CHRONIC 05/19/2007   SPONDYLOSIS, LUMBAR 05/19/2007   Essential hypertension 05/03/2007   ARTHRITIS, KNEE 05/03/2007    PCP: Bridgette Sluder, PA  REFERRING PROVIDER: Valdemar Rocky SAUNDERS, NP  REFERRING DIAG:  [phone] (ICD-10-CM) - Total knee replacement status, left  D16.577J (ICD-10-CM) - Sprain of lateral collateral ligament  of left knee, initial encounter    THERAPY DIAG:  Chronic pain of left knee  Muscle weakness (generalized)  Difficulty in walking, not elsewhere classified  Rationale for Evaluation and Treatment: Rehabilitation  ONSET DATE: 12/26/22 A long time  SUBJECTIVE:   SUBJECTIVE STATEMENT: 06/09/2024:  Pt reports that her right knee continues to hurt.   EVAL: Pt reports a chronic Hx of knee pain L>R with a TKA of the L completed 5/24. She notes her knees bother her when walking with them starting to be more painful and swollen the past few months. She also endorses low back pain. She feels like thet are now less swollen.   PERTINENT HISTORY: See above. High BMI.  PAIN:  Are you having pain? Yes: NPRS scale: 06/09/2024 8/10   (8/10 right knee, 8/10 left side starting Monday morning)  Pain location: L knee, both sides of the knee Pain description: ache, sharp Aggravating factors: Increased time on feey Relieving factors: Lidocaine  patches  PRECAUTIONS: None  RED FLAGS: None   WEIGHT BEARING RESTRICTIONS: No  FALLS:  Has patient fallen in last 6 months? No  LIVING ENVIRONMENT: Lives with: lives alone Lives in: House/apartment Stairs: No Has following equipment at home: Single point cane and Environmental Consultant - 4 wheeled  OCCUPATION: retired  PLOF: Independent with household mobility with device and Independent with  community mobility with device  PATIENT GOALS: To walk better s rollator if possible and with less pain  NEXT MD VISIT: Not scheduled  OBJECTIVE:  Note: Objective measures were completed at Evaluation unless otherwise noted.  DIAGNOSTIC FINDINGS:  03/02/24 X-ray L Knee Radiographs of the left knee show stable alignment of total knee  arthroplasty hardware there is no complicating features.  There is  end-stage osteoarthritis present to the right knee.  With bone-on-bone  contact.   PATIENT SURVEYS:  LEFS: 31/80=39% 05/12/24: 45%   COGNITION: Overall cognitive  status: Within functional limits for tasks assessed     SENSATION: WFL  EDEMA:  Swelling present  MUSCLE LENGTH: Hamstrings: Right: Tight deg; Left tight deg Thomas test: Right Tight deg; Left Tight deg  POSTURE: increased lumbar lordosis and flexed trunk   PALPATION: TTP to the peri- L knee  LOWER EXTREMITY ROM:  Active ROM Right eval Left eval Right 03/31/24 Left 03/31/24 Left 05/19/24 RT 06/02/24  Hip flexion        Hip extension        Hip abduction        Hip adduction        Hip internal rotation        Hip external rotation        Knee flexion 90 100 110 110 112 112d/115d  Knee extension 15 lacking 15 lacking      Ankle dorsiflexion        Ankle plantarflexion        Ankle inversion        Ankle eversion         (Blank rows = not tested)  LOWER EXTREMITY MMT:  MMT Right eval Left eval Rt 04/21/24 Lt 04/21/24 Rt Lt  Hip flexion 3 3 3  pain 3 pain 4+ 4+  Hip extension 2 2      Hip abduction 3 3 4 4 4 4   Hip adduction        Hip internal rotation        Hip external rotation 3 3 4 4  4+ 4+  Knee flexion 4 4 4+ 4+ 4+ 5  Knee extension 4 4 4+ 4+ 4+ 5  Ankle dorsiflexion        Ankle plantarflexion        Ankle inversion        Ankle eversion         (Blank rows = not tested)   FUNCTIONAL TESTS:  5 times sit to stand: 29.5  c use of hands 2 minute walk test: TBA  03/23/24: 193 feet with SPC, pt with increased SOB due to exertion  05/12/24: 215 feet   GAIT: Distance walked: 100' Assistive device utilized: Environmental Consultant - 4 wheeled Level of assistance: Modified independence Comments: Decreased pace and step length                                                                                                                    T[facility]  [facility]:                                                DATE: 06/09/24 Therapeutic Exercise/Activities: Nustep L6 x 10 min ROM MMT LAQ 4# 2 x 20, bil Seated march 4# 5 x 4 each  STSx 10 SLR  x 15 each  Side hip abduction  x 20 each Seated low back stretch 2 x 10  with ball   5xSTS     TREATMENT 06/08/24:  Aquatic therapy at [facility]- [address] - therapeutic pool temp 90-92 degrees   Aquatic Therapy:  Water walking for warm up fwd/lat/bkwds  Hip abd Mini squat HS curl Heel raise Step up bil UE support Lat Lateral walking at pool edge Stand march EOP Knee flexion stretch LAQ   Pt requires the buoyancy of water for active assisted exercises with buoyancy supported for strengthening and AROM exercises. Hydrostatic pressure also supports joints by unweighting joint load by at least 50 % in 3-4 feet depth water. 80% in chest to neck deep water. Water will provide assistance with movement using the current and laminar flow while the buoyancy reduces weight bearing. Pt requires the viscosity of the water for resistance with strengthening exercises.  [facility] Adult PT Treatment:                                                DATE: 06/02/24 Therapeutic Exercise/Activities: Nustep L6 x 10 min ROM MMT LAQ 5# 2 x 20, bil STSx 10 SLR x 15 each  Side hip abduction  x 20 each Seated low back stretch 2 x 10  with ball   5xSTS  [facility] Adult PT Treatment:                                                DATE: 05/27/24 Nustep L6 x 10 min LAQ 4# 2 x 20, bil Bil marching 1 x 20 bil 4# Bridge x 10 SLR x 10 each  Side hip abduction  x 20 each  Seated low back stretch 2 x 10  with ball   TREATMENT 05/25/24:  Aquatic therapy at [facility]- [address] - therapeutic pool temp 90-92 degrees   Aquatic Therapy:  Water walking for warm up fwd/lat/bkwds  Hip abd Mini squat HS curl Heel raise Step up bil UE support Lat Lateral walking at pool edge Stand march EOP Knee flexion stretch Step up fwd and lat  Pt requires the buoyancy of water for active assisted exercises with buoyancy supported for strengthening and AROM exercises. Hydrostatic pressure also  supports joints by unweighting joint load by at least 50 % in 3-4 feet depth water. 80% in chest to neck deep water. Water will provide assistance with movement using the current and laminar flow while the buoyancy reduces weight bearing. Pt requires the viscosity of the water for resistance with strengthening exercises.  [facility] Adult PT Treatment:  DATE: 05/19/24 Nustep L6 x 10 min 2 MWT 159 ft with SPC IASTM along the R patellar and quad tendon MTPR along the R quad x 3 LAQ 4# 2 x 20, bil Bil marching 1 x 20 bil 4# Sit to stand 1 x 10 (cues on how proper mechanics) Seated low back stretch 2 x 10   ASSESSMENT:  CLINICAL IMPRESSION:  06/09/2024: Pt reports that she did well in aquatic therapy but did still have right knee pain while in the water. Today we continued land based strengthening as tolerated. Limited by right knee pain. Will work toward finalizing an HEP for land and water over her remaining visits.  Patient continues to benefit from skilled PT services on land and aquatic based and should be progressed as able to improve functional independence.    EVAL: Patient is a 73 y.o. female who was seen today for physical therapy evaluation and treatment for  Z96.652 (ICD-10-CM) - Total knee replacement status, left  S83.422A (ICD-10-CM) - Sprain of lateral collateral ligament of left knee, initial encounter   Pt presents with decreased LE strength, AROM, and chronic knee pain bilat L>R which is negatively impacting the pt's quality and tolerance for functional mobility. Pt will benefit from skilled PT 2w6 to address impairments to optimize leg function  and mobility with less pain.   OBJECTIVE IMPAIRMENTS: decreased activity tolerance, decreased balance, decreased endurance, decreased mobility, difficulty walking, decreased ROM, decreased strength, and increased edema, and pain.  PERSONAL FACTORS: Age, Fitness, Past/current experiences, Time  since onset of injury/illness/exacerbation, and 1-2 comorbidities: high BMI, trunk postural changes are also affecting patient's functional outcome.   REHAB POTENTIAL: Good  CLINICAL DECISION MAKING: Evolving/moderate complexity  EVALUATION COMPLEXITY: Moderate   GOALS:  SHORT TERM GOALS: Target date: 04/08/24 Pt will be Ind in an initial HEP  Baseline: started Goal status: MET  LONG TERM GOALS: Target date: 07/01/24  Pt will be Ind in a final HEP to maintain achieved  LOF  Baseline:  Goal status: INITIAL  2.  Increase pt's bilat knee and hip strengths by 1/2 muscle grade to improve pt's function with daily activities Baseline:  04/21/24: see flow sheets 06/02/24: see flow sheet Goal status: MET, continued improvement  3.  Increased pt's bilat knee flexion to 105d or greater to improve pt's to obtain sitting positions Baseline: L 100d, R 90d 04/21/24: L 105; R 105 06/02/24: R 115; L 112 Goal status: MET, continued improvement  4.  Improve 5xSTS by MCID of 5 and by MCID of 47ft as indication of improved functional mobility  Baseline: 5xSTS= 29.5, 193 with SPC 05/10/24: 21.8 sec x use of hands; 191 c SPC limited by knee pain and DOE 06/02/24: 18.8 Goal status: MET, continued improvement  5.  Pt will report 50% or greater improvement in her L knee pain for improved function and QOL Baseline: 4-8/10 05/03/24: Left knee is no longer painful, she is limited by right knee pain  05/10/24: L knee-50%; R 25% Goal status: IMPROVED  6.  Pt's LEFS score will improve by the the MCID to 49% or greater as indication of improved function  Baseline: 39% 05/10/24: 45% Goal status: IMPROVED    PLAN:  PT FREQUENCY: 2x/week  PT DURATION: 6 weeks  PLANNED INTERVENTIONS: 97164- PT Re-evaluation, 97110-Therapeutic exercises, 97530- Therapeutic activity, 97112- Neuromuscular re-education, 97535- Self Care, [id number]- Manual therapy, 97116- Gait training, [phone]- Vasopneumatic device,  Patient/Family education, Balance training, Stair training, Taping, Joint mobilization, Cryotherapy, and Moist heat,  Aquatics  PLAN FOR NEXT SESSION: : assess response to HEP; progress therex as indicated; use of modalities, and manual therapy

## 2024-06-15 ENCOUNTER — Encounter: Payer: Self-pay | Admitting: Physical Therapy

## 2024-06-15 ENCOUNTER — Ambulatory Visit: Payer: Self-pay | Admitting: Physical Therapy

## 2024-06-15 DIAGNOSIS — R2689 Other abnormalities of gait and mobility: Secondary | ICD-10-CM

## 2024-06-15 DIAGNOSIS — R262 Difficulty in walking, not elsewhere classified: Secondary | ICD-10-CM

## 2024-06-15 DIAGNOSIS — G8929 Other chronic pain: Secondary | ICD-10-CM

## 2024-06-15 DIAGNOSIS — M6281 Muscle weakness (generalized): Secondary | ICD-10-CM

## 2024-06-15 DIAGNOSIS — M25562 Pain in left knee: Secondary | ICD-10-CM | POA: Diagnosis not present

## 2024-06-15 NOTE — Therapy (Signed)
 OUTPATIENT PHYSICAL THERAPY LOWER EXTREMITY TREATMENT     Patient Name: Alison Whitehead MRN: [id number] DOB:[birth year], 73 y.o., female Today's Date: 06/15/2024  END OF SESSION:  PT End of Session - 06/15/24 1342     Visit Number 22    Number of Visits 25    Date for Recertification  07/01/24    Authorization Type [employer] MEDICARE HMO/PPO    PT Start Time 1337    PT Stop Time 1415    PT Time Calculation (min) 38 min                    Past Medical History:  Diagnosis Date   Allergy    seasonal   Arthritis    CKD (chronic kidney disease)    DDD (degenerative disc disease)    Degenerative disc disease, lumbar    Hypertension    IBS (irritable bowel syndrome)    Osteopenia    Osteoporosis    osteopenia   Stroke (HCC) 09/2019   Past Surgical History:  Procedure Laterality Date   SHOULDER SURGERY Left    lipoma   TOTAL KNEE ARTHROPLASTY Left 12/26/2022   Procedure: LEFT TOTAL KNEE ARTHROPLASTY;  Surgeon: Harden Jerona GAILS, MD;  Location: [facility] OR;  Service: Orthopedics;  Laterality: Left;   Patient Active Problem List   Diagnosis Date Noted   Total knee replacement status, 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   Rotator cuff syndrome, right 01/11/2020   Labile blood glucose    Vascular headache    Small vessel disease, cerebrovascular    Prediabetes    AKI (acute kidney injury)    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   Acute cerebrovascular accident (CVA) (HCC) 09/30/2019   Unilateral primary osteoarthritis, left knee 03/17/2019   Unilateral primary osteoarthritis, right knee 03/17/2019   Bilateral primary osteoarthritis of knee 03/10/2019   INSOMNIA, CHRONIC 05/19/2007   SPONDYLOSIS, LUMBAR 05/19/2007   Essential hypertension 05/03/2007   ARTHRITIS, KNEE 05/03/2007    PCP: Bridgette Sluder, PA  REFERRING PROVIDER: Valdemar Rocky SAUNDERS, NP  REFERRING DIAG:  [phone] (ICD-10-CM) - Total knee replacement status, left  D16.577J (ICD-10-CM) - Sprain of lateral collateral  ligament of left knee, initial encounter    THERAPY DIAG:  Chronic pain of left knee  Muscle weakness (generalized)  Difficulty in walking, not elsewhere classified  Other abnormalities of gait and mobility  Rationale for Evaluation and Treatment: Rehabilitation  ONSET DATE: 12/26/22 A long time  SUBJECTIVE:   SUBJECTIVE STATEMENT: 06/15/2024:  Pt reports that her right knee continues to hurt.   EVAL: Pt reports a chronic Hx of knee pain L>R with a TKA of the L completed 5/24. She notes her knees bother her when walking with them starting to be more painful and swollen the past few months. She also endorses low back pain. She feels like thet are now less swollen.   PERTINENT HISTORY: See above. High BMI.  PAIN:  Are you having pain? Yes: NPRS scale: 06/15/2024 8/10   (8/10 right knee, 8/10 left side starting Monday morning)  Pain location: L knee, both sides of the knee Pain description: ache, sharp Aggravating factors: Increased time on feey Relieving factors: Lidocaine  patches  PRECAUTIONS: None  RED FLAGS: None   WEIGHT BEARING RESTRICTIONS: No  FALLS:  Has patient fallen in last 6 months? No  LIVING ENVIRONMENT: Lives with: lives alone Lives in: House/apartment Stairs: No Has following equipment at home: Single point cane and Environmental Consultant - 4 wheeled  OCCUPATION: retired  PLOF: Independent  with household mobility with device and Independent with community mobility with device  PATIENT GOALS: To walk better s rollator if possible and with less pain  NEXT MD VISIT: Not scheduled  OBJECTIVE:  Note: Objective measures were completed at Evaluation unless otherwise noted.  DIAGNOSTIC FINDINGS:  03/02/24 X-ray L Knee Radiographs of the left knee show stable alignment of total knee  arthroplasty hardware there is no complicating features.  There is  end-stage osteoarthritis present to the right knee.  With bone-on-bone  contact.   PATIENT SURVEYS:  LEFS:  31/80=39% 05/12/24: 45%   COGNITION: Overall cognitive status: Within functional limits for tasks assessed     SENSATION: WFL  EDEMA:  Swelling present  MUSCLE LENGTH: Hamstrings: Right: Tight deg; Left tight deg Thomas test: Right Tight deg; Left Tight deg  POSTURE: increased lumbar lordosis and flexed trunk   PALPATION: TTP to the peri- L knee  LOWER EXTREMITY ROM:  Active ROM Right eval Left eval Right 03/31/24 Left 03/31/24 Left 05/19/24 RT 06/02/24  Hip flexion        Hip extension        Hip abduction        Hip adduction        Hip internal rotation        Hip external rotation        Knee flexion 90 100 110 110 112 112d/115d  Knee extension 15 lacking 15 lacking      Ankle dorsiflexion        Ankle plantarflexion        Ankle inversion        Ankle eversion         (Blank rows = not tested)  LOWER EXTREMITY MMT:  MMT Right eval Left eval Rt 04/21/24 Lt 04/21/24 Rt Lt  Hip flexion 3 3 3  pain 3 pain 4+ 4+  Hip extension 2 2      Hip abduction 3 3 4 4 4 4   Hip adduction        Hip internal rotation        Hip external rotation 3 3 4 4  4+ 4+  Knee flexion 4 4 4+ 4+ 4+ 5  Knee extension 4 4 4+ 4+ 4+ 5  Ankle dorsiflexion        Ankle plantarflexion        Ankle inversion        Ankle eversion         (Blank rows = not tested)   FUNCTIONAL TESTS:  5 times sit to stand: 29.5  c use of hands 2 minute walk test: TBA  03/23/24: 193 feet with SPC, pt with increased SOB due to exertion  05/12/24: 215 feet   GAIT: Distance walked: 100' Assistive device utilized: Environmental Consultant - 4 wheeled Level of assistance: Modified independence Comments: Decreased pace and step length  TREATMENT DATE:   TREATMENT 06/15/24:  Aquatic therapy at [facility]- [address] - therapeutic pool temp 90-92 degrees   Aquatic Therapy:  Water walking  for warm up fwd/lat/bkwds  Hip abd Mini squat HS curl Heel raise Standing knee ext with noodle Step up bil UE support Lateral walking at pool edge LAQ Hip abd circles   Pt requires the buoyancy of water for active assisted exercises with buoyancy supported for strengthening and AROM exercises. Hydrostatic pressure also supports joints by unweighting joint load by at least 50 % in 3-4 feet depth water. 80% in chest to neck deep water. Water will provide assistance with movement using the current and laminar flow while the buoyancy reduces weight bearing. Pt requires the viscosity of the water for resistance with strengthening exercises.  [facility] Adult PT Treatment:                                                DATE: 06/09/24 Therapeutic Exercise/Activities: Nustep L6 x 10 min ROM MMT LAQ 4# 2 x 20, bil Seated march 4# 5 x 4 each  STSx 10 SLR x 15 each  Side hip abduction  x 20 each Seated low back stretch 2 x 10  with ball   5xSTS     TREATMENT 06/08/24:  Aquatic therapy at [facility]- [address] - therapeutic pool temp 90-92 degrees   Aquatic Therapy:  Water walking for warm up fwd/lat/bkwds  Hip abd Mini squat HS curl Heel raise Step up bil UE support Lat Lateral walking at pool edge Stand march EOP Knee flexion stretch LAQ   Pt requires the buoyancy of water for active assisted exercises with buoyancy supported for strengthening and AROM exercises. Hydrostatic pressure also supports joints by unweighting joint load by at least 50 % in 3-4 feet depth water. 80% in chest to neck deep water. Water will provide assistance with movement using the current and laminar flow while the buoyancy reduces weight bearing. Pt requires the viscosity of the water for resistance with strengthening exercises.  [facility] Adult PT Treatment:                                                DATE: 06/02/24 Therapeutic Exercise/Activities: Nustep L6 x 10 min ROM MMT LAQ 5# 2 x 20,  bil STSx 10 SLR x 15 each  Side hip abduction  x 20 each Seated low back stretch 2 x 10  with ball   5xSTS  [facility] Adult PT Treatment:                                                DATE: 05/27/24 Nustep L6 x 10 min LAQ 4# 2 x 20, bil Bil marching 1 x 20 bil 4# Bridge x 10 SLR x 10 each  Side hip abduction  x 20 each  Seated low back stretch 2 x 10  with ball   TREATMENT 05/25/24:  Aquatic therapy at [facility]- [address] - therapeutic pool temp 90-92 degrees   Aquatic Therapy:  Water walking for warm up fwd/lat/bkwds  Hip  abd Mini squat HS curl Heel raise Step up bil UE support Lat Lateral walking at pool edge Stand march EOP Knee flexion stretch Step up fwd and lat  Pt requires the buoyancy of water for active assisted exercises with buoyancy supported for strengthening and AROM exercises. Hydrostatic pressure also supports joints by unweighting joint load by at least 50 % in 3-4 feet depth water. 80% in chest to neck deep water. Water will provide assistance with movement using the current and laminar flow while the buoyancy reduces weight bearing. Pt requires the viscosity of the water for resistance with strengthening exercises.  [facility] Adult PT Treatment:                                                DATE: 05/19/24 Nustep L6 x 10 min 2 MWT 159 ft with SPC IASTM along the R patellar and quad tendon MTPR along the R quad x 3 LAQ 4# 2 x 20, bil Bil marching 1 x 20 bil 4# Sit to stand 1 x 10 (cues on how proper mechanics) Seated low back stretch 2 x 10   ASSESSMENT:  CLINICAL IMPRESSION:  06/15/2024: Session today focused on knee and hip strength in the aquatic environment for use of buoyancy to offload joints and the viscosity of water as resistance during therapeutic exercise.  Pt reports significant reduction in knee pain compared to land based exercises in the pool.  Able to progress to resisted knee ext with noodle with pt noting fatigue>pain  Patient  was able to tolerate all prescribed exercises in the aquatic environment with no adverse effects and reports slight reduction in pain at the end of the session. Patient continues to benefit from skilled PT services on land and aquatic based and should be progressed as able to improve functional independence.    EVAL: Patient is a 73 y.o. female who was seen today for physical therapy evaluation and treatment for  Z96.652 (ICD-10-CM) - Total knee replacement status, left  S83.422A (ICD-10-CM) - Sprain of lateral collateral ligament of left knee, initial encounter   Pt presents with decreased LE strength, AROM, and chronic knee pain bilat L>R which is negatively impacting the pt's quality and tolerance for functional mobility. Pt will benefit from skilled PT 2w6 to address impairments to optimize leg function  and mobility with less pain.   OBJECTIVE IMPAIRMENTS: decreased activity tolerance, decreased balance, decreased endurance, decreased mobility, difficulty walking, decreased ROM, decreased strength, and increased edema, and pain.  PERSONAL FACTORS: Age, Fitness, Past/current experiences, Time since onset of injury/illness/exacerbation, and 1-2 comorbidities: high BMI, trunk postural changes are also affecting patient's functional outcome.   REHAB POTENTIAL: Good  CLINICAL DECISION MAKING: Evolving/moderate complexity  EVALUATION COMPLEXITY: Moderate   GOALS:  SHORT TERM GOALS: Target date: 04/08/24 Pt will be Ind in an initial HEP  Baseline: started Goal status: MET  LONG TERM GOALS: Target date: 07/01/24  Pt will be Ind in a final HEP to maintain achieved  LOF  Baseline:  Goal status: INITIAL  2.  Increase pt's bilat knee and hip strengths by 1/2 muscle grade to improve pt's function with daily activities Baseline:  04/21/24: see flow sheets 06/02/24: see flow sheet Goal status: MET, continued improvement  3.  Increased pt's bilat knee flexion to 105d or greater to improve pt's  to obtain sitting positions Baseline: L  100d, R 90d 04/21/24: L 105; R 105 06/02/24: R 115; L 112 Goal status: MET, continued improvement  4.  Improve 5xSTS by MCID of 5 and by MCID of 17ft as indication of improved functional mobility  Baseline: 5xSTS= 29.5, 193 with SPC 05/10/24: 21.8 sec x use of hands; 191 c SPC limited by knee pain and DOE 06/02/24: 18.8 Goal status: MET, continued improvement  5.  Pt will report 50% or greater improvement in her L knee pain for improved function and QOL Baseline: 4-8/10 05/03/24: Left knee is no longer painful, she is limited by right knee pain  05/10/24: L knee-50%; R 25% Goal status: IMPROVED  6.  Pt's LEFS score will improve by the the MCID to 49% or greater as indication of improved function  Baseline: 39% 05/10/24: 45% Goal status: IMPROVED    PLAN:  PT FREQUENCY: 2x/week  PT DURATION: 6 weeks  PLANNED INTERVENTIONS: [id number]- PT Re-evaluation, 97110-Therapeutic exercises, 97530- Therapeutic activity, 97112- Neuromuscular re-education, 97535- Self Care, [id number]- Manual therapy, [id number]- Gait training, [phone]- Vasopneumatic device, Patient/Family education, Balance training, Stair training, Taping, Joint mobilization, Cryotherapy, and Moist heat, Aquatics  PLAN FOR NEXT SESSION: : assess response to HEP; progress therex as indicated; use of modalities, and manual therapy

## 2024-06-21 ENCOUNTER — Encounter: Payer: Self-pay | Admitting: Physical Therapy

## 2024-06-21 ENCOUNTER — Ambulatory Visit: Payer: Self-pay | Admitting: Physical Therapy

## 2024-06-21 DIAGNOSIS — M6281 Muscle weakness (generalized): Secondary | ICD-10-CM

## 2024-06-21 DIAGNOSIS — M25562 Pain in left knee: Secondary | ICD-10-CM | POA: Diagnosis not present

## 2024-06-21 DIAGNOSIS — G8929 Other chronic pain: Secondary | ICD-10-CM

## 2024-06-21 DIAGNOSIS — R262 Difficulty in walking, not elsewhere classified: Secondary | ICD-10-CM

## 2024-06-21 NOTE — Therapy (Signed)
 OUTPATIENT PHYSICAL THERAPY LOWER EXTREMITY TREATMENT     Patient Name: Alison Whitehead MRN: [id number] DOB:12-08-1950, 73 y.o., female Today's Date: 06/21/2024  END OF SESSION:  PT End of Session - 06/21/24 1017     Visit Number 23    Number of Visits 25    Date for Recertification  07/01/24    Authorization Type [employer] MEDICARE HMO/PPO    PT Start Time 1017    PT Stop Time 1057    PT Time Calculation (min) 40 min                    Past Medical History:  Diagnosis Date   Allergy    seasonal   Arthritis    CKD (chronic kidney disease)    DDD (degenerative disc disease)    Degenerative disc disease, lumbar    Hypertension    IBS (irritable bowel syndrome)    Osteopenia    Osteoporosis    osteopenia   Stroke (HCC) 09/2019   Past Surgical History:  Procedure Laterality Date   SHOULDER SURGERY Left    lipoma   TOTAL KNEE ARTHROPLASTY Left 12/26/2022   Procedure: LEFT TOTAL KNEE ARTHROPLASTY;  Surgeon: Harden Jerona GAILS, MD;  Location: [facility] OR;  Service: Orthopedics;  Laterality: Left;   Patient Active Problem List   Diagnosis Date Noted   Total knee replacement status, 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   Rotator cuff syndrome, right 01/11/2020   Labile blood glucose    Vascular headache    Small vessel disease, cerebrovascular    Prediabetes    AKI (acute kidney injury)    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   Acute cerebrovascular accident (CVA) (HCC) 09/30/2019   Unilateral primary osteoarthritis, left knee 03/17/2019   Unilateral primary osteoarthritis, right knee 03/17/2019   Bilateral primary osteoarthritis of knee 03/10/2019   INSOMNIA, CHRONIC 05/19/2007   SPONDYLOSIS, LUMBAR 05/19/2007   Essential hypertension 05/03/2007   ARTHRITIS, KNEE 05/03/2007    PCP: Bridgette Sluder, PA  REFERRING PROVIDER: Valdemar Rocky SAUNDERS, NP  REFERRING DIAG:  [id number] (ICD-10-CM) - Total knee replacement status, left  D16.577J (ICD-10-CM) - Sprain of lateral collateral  ligament of left knee, initial encounter    THERAPY DIAG:  Chronic pain of left knee  Muscle weakness (generalized)  Difficulty in walking, not elsewhere classified  Rationale for Evaluation and Treatment: Rehabilitation  ONSET DATE: 12/26/22 A long time  SUBJECTIVE:   SUBJECTIVE STATEMENT: 06/21/2024:  Pt reports that her knees are more sore today.  She attributes this to the cold weather.  EVAL: Pt reports a chronic Hx of knee pain L>R with a TKA of the L completed 5/24. She notes her knees bother her when walking with them starting to be more painful and swollen the past few months. She also endorses low back pain. She feels like thet are now less swollen.   PERTINENT HISTORY: See above. High BMI.  PAIN:  Are you having pain? Yes: NPRS scale: 06/21/2024 8/10   (8/10 right knee, 8/10 left side starting Monday morning)  Pain location: L knee, both sides of the knee Pain description: ache, sharp Aggravating factors: Increased time on feey Relieving factors: Lidocaine  patches  PRECAUTIONS: None  RED FLAGS: None   WEIGHT BEARING RESTRICTIONS: No  FALLS:  Has patient fallen in last 6 months? No  LIVING ENVIRONMENT: Lives with: lives alone Lives in: House/apartment Stairs: No Has following equipment at home: Single point cane and Environmental Consultant - 4 wheeled  OCCUPATION: retired  PLOF: Independent  with household mobility with device and Independent with community mobility with device  PATIENT GOALS: To walk better s rollator if possible and with less pain  NEXT MD VISIT: Not scheduled  OBJECTIVE:  Note: Objective measures were completed at Evaluation unless otherwise noted.  DIAGNOSTIC FINDINGS:  03/02/24 X-ray L Knee Radiographs of the left knee show stable alignment of total knee  arthroplasty hardware there is no complicating features.  There is  end-stage osteoarthritis present to the right knee.  With bone-on-bone  contact.   PATIENT SURVEYS:  LEFS:  31/80=39% 05/12/24: 45%   COGNITION: Overall cognitive status: Within functional limits for tasks assessed     SENSATION: WFL  EDEMA:  Swelling present  MUSCLE LENGTH: Hamstrings: Right: Tight deg; Left tight deg Thomas test: Right Tight deg; Left Tight deg  POSTURE: increased lumbar lordosis and flexed trunk   PALPATION: TTP to the peri- L knee  LOWER EXTREMITY ROM:  Active ROM Right eval Left eval Right 03/31/24 Left 03/31/24 Left 05/19/24 RT 06/02/24  Hip flexion        Hip extension        Hip abduction        Hip adduction        Hip internal rotation        Hip external rotation        Knee flexion 90 100 110 110 112 112d/115d  Knee extension 15 lacking 15 lacking      Ankle dorsiflexion        Ankle plantarflexion        Ankle inversion        Ankle eversion         (Blank rows = not tested)  LOWER EXTREMITY MMT:  MMT Right eval Left eval Rt 04/21/24 Lt 04/21/24 Rt Lt  Hip flexion 3 3 3  pain 3 pain 4+ 4+  Hip extension 2 2      Hip abduction 3 3 4 4 4 4   Hip adduction        Hip internal rotation        Hip external rotation 3 3 4 4  4+ 4+  Knee flexion 4 4 4+ 4+ 4+ 5  Knee extension 4 4 4+ 4+ 4+ 5  Ankle dorsiflexion        Ankle plantarflexion        Ankle inversion        Ankle eversion         (Blank rows = not tested)   FUNCTIONAL TESTS:  5 times sit to stand: 29.5  c use of hands 2 minute walk test: TBA  03/23/24: 193 feet with SPC, pt with increased SOB due to exertion  05/12/24: 215 feet   GAIT: Distance walked: 100' Assistive device utilized: Environmental Consultant - 4 wheeled Level of assistance: Modified independence Comments: Decreased pace and step length  TREATMENT DATE:   [facility] Adult PT Treatment:                                                DATE: 06/21/24 Therapeutic Exercise/Activities: Nustep L6 x 6 min LAQ 5# 3 x  20, bil Seated march 4# 3x10 Seated HS curl - Blue TB STSx 2x7 SLR 2x10 ea Supine clam shell - 2x10 Side hip abduction  x15 each Seated low back stretch 2 x 10  with ball    TREATMENT 06/15/24:  Aquatic therapy at [facility]- [address] - therapeutic pool temp 90-92 degrees   Aquatic Therapy:  Water walking for warm up fwd/lat/bkwds  Hip abd Mini squat HS curl Heel raise Standing knee ext with noodle Step up bil UE support Lateral walking at pool edge LAQ Hip abd circles   Pt requires the buoyancy of water for active assisted exercises with buoyancy supported for strengthening and AROM exercises. Hydrostatic pressure also supports joints by unweighting joint load by at least 50 % in 3-4 feet depth water. 80% in chest to neck deep water. Water will provide assistance with movement using the current and laminar flow while the buoyancy reduces weight bearing. Pt requires the viscosity of the water for resistance with strengthening exercises.  [facility] Adult PT Treatment:                                                DATE: 06/09/24 Therapeutic Exercise/Activities: Nustep L6 x 10 min ROM MMT LAQ 4# 2 x 20, bil Seated march 4# 5 x 4 each  STSx 10 SLR x 15 each  Side hip abduction  x 20 each Seated low back stretch 2 x 10  with ball   5xSTS     TREATMENT 06/08/24:  Aquatic therapy at [facility]- [address] - therapeutic pool temp 90-92 degrees   Aquatic Therapy:  Water walking for warm up fwd/lat/bkwds  Hip abd Mini squat HS curl Heel raise Step up bil UE support Lat Lateral walking at pool edge Stand march EOP Knee flexion stretch LAQ   Pt requires the buoyancy of water for active assisted exercises with buoyancy supported for strengthening and AROM exercises. Hydrostatic pressure also supports joints by unweighting joint load by at least 50 % in 3-4 feet depth water. 80% in chest to neck deep water. Water will provide assistance with movement  using the current and laminar flow while the buoyancy reduces weight bearing. Pt requires the viscosity of the water for resistance with strengthening exercises.  [facility] Adult PT Treatment:                                                DATE: 06/02/24 Therapeutic Exercise/Activities: Nustep L6 x 10 min ROM MMT LAQ 5# 2 x 20, bil STSx 10 SLR x 15 each  Side hip abduction  x 20 each Seated low back stretch 2 x 10  with ball   5xSTS  [facility] Adult PT Treatment:  DATE: 05/27/24 Nustep L6 x 10 min LAQ 4# 2 x 20, bil Bil marching 1 x 20 bil 4# Bridge x 10 SLR x 10 each  Side hip abduction  x 20 each  Seated low back stretch 2 x 10  with ball   TREATMENT 05/25/24:  Aquatic therapy at [facility]- [address] - therapeutic pool temp 90-92 degrees   Aquatic Therapy:  Water walking for warm up fwd/lat/bkwds  Hip abd Mini squat HS curl Heel raise Step up bil UE support Lat Lateral walking at pool edge Stand march EOP Knee flexion stretch Step up fwd and lat  Pt requires the buoyancy of water for active assisted exercises with buoyancy supported for strengthening and AROM exercises. Hydrostatic pressure also supports joints by unweighting joint load by at least 50 % in 3-4 feet depth water. 80% in chest to neck deep water. Water will provide assistance with movement using the current and laminar flow while the buoyancy reduces weight bearing. Pt requires the viscosity of the water for resistance with strengthening exercises.  [facility] Adult PT Treatment:                                                DATE: 05/19/24 Nustep L6 x 10 min 2 MWT 159 ft with SPC IASTM along the R patellar and quad tendon MTPR along the R quad x 3 LAQ 4# 2 x 20, bil Bil marching 1 x 20 bil 4# Sit to stand 1 x 10 (cues on how proper mechanics) Seated low back stretch 2 x 10   ASSESSMENT:  CLINICAL IMPRESSION:  06/21/2024: Wanda tolerated session well  with no adverse reaction.  Pt innitially reports mod levels of pain and stiffness but this reduces throughout session.  Able to progress intensity or volume of listed exercises with the goal of maximizing strength and ROM.  Pt cued for form and positioning throughout.  EVAL: Patient is a 73 y.o. female who was seen today for physical therapy evaluation and treatment for  Z96.652 (ICD-10-CM) - Total knee replacement status, left  S83.422A (ICD-10-CM) - Sprain of lateral collateral ligament of left knee, initial encounter   Pt presents with decreased LE strength, AROM, and chronic knee pain bilat L>R which is negatively impacting the pt's quality and tolerance for functional mobility. Pt will benefit from skilled PT 2w6 to address impairments to optimize leg function  and mobility with less pain.   OBJECTIVE IMPAIRMENTS: decreased activity tolerance, decreased balance, decreased endurance, decreased mobility, difficulty walking, decreased ROM, decreased strength, and increased edema, and pain.  PERSONAL FACTORS: Age, Fitness, Past/current experiences, Time since onset of injury/illness/exacerbation, and 1-2 comorbidities: high BMI, trunk postural changes are also affecting patient's functional outcome.   REHAB POTENTIAL: Good  CLINICAL DECISION MAKING: Evolving/moderate complexity  EVALUATION COMPLEXITY: Moderate   GOALS:  SHORT TERM GOALS: Target date: 04/08/24 Pt will be Ind in an initial HEP  Baseline: started Goal status: MET  LONG TERM GOALS: Target date: 07/01/24  Pt will be Ind in a final HEP to maintain achieved  LOF  Baseline:  Goal status: INITIAL  2.  Increase pt's bilat knee and hip strengths by 1/2 muscle grade to improve pt's function with daily activities Baseline:  04/21/24: see flow sheets 06/02/24: see flow sheet Goal status: MET, continued improvement  3.  Increased pt's bilat knee flexion to  105d or greater to improve pt's to obtain sitting positions Baseline: L  100d, R 90d 04/21/24: L 105; R 105 06/02/24: R 115; L 112 Goal status: MET, continued improvement  4.  Improve 5xSTS by MCID of 5 and by MCID of 80ft as indication of improved functional mobility  Baseline: 5xSTS= 29.5, 193 with SPC 05/10/24: 21.8 sec x use of hands; 191 c SPC limited by knee pain and DOE 06/02/24: 18.8 Goal status: MET, continued improvement  5.  Pt will report 50% or greater improvement in her L knee pain for improved function and QOL Baseline: 4-8/10 05/03/24: Left knee is no longer painful, she is limited by right knee pain  05/10/24: L knee-50%; R 25% Goal status: IMPROVED  6.  Pt's LEFS score will improve by the the MCID to 49% or greater as indication of improved function  Baseline: 39% 05/10/24: 45% Goal status: IMPROVED    PLAN:  PT FREQUENCY: 2x/week  PT DURATION: 6 weeks  PLANNED INTERVENTIONS: 97164- PT Re-evaluation, 97110-Therapeutic exercises, 97530- Therapeutic activity, 97112- Neuromuscular re-education, 97535- Self Care, 02859- Manual therapy, 97116- Gait training, [phone]- Vasopneumatic device, Patient/Family education, Balance training, Stair training, Taping, Joint mobilization, Cryotherapy, and Moist heat, Aquatics  PLAN FOR NEXT SESSION: : assess response to HEP; progress therex as indicated; use of modalities, and manual therapy

## 2024-06-22 NOTE — Therapy (Signed)
 OUTPATIENT PHYSICAL THERAPY LOWER EXTREMITY TREATMENT     Patient Name: Alison Whitehead MRN: [id number] DOB:Apr 30, 1951, 73 y.o., female Today's Date: 06/23/2024  END OF SESSION:  PT End of Session - 06/23/24 1136     Visit Number 24    Number of Visits 25    Date for Recertification  07/01/24    Authorization Type [employer] MEDICARE HMO/PPO    PT Start Time 1104    PT Stop Time 1149    PT Time Calculation (min) 45 min    Activity Tolerance Patient tolerated treatment well;Patient limited by pain    Behavior During Therapy [facility] for tasks assessed/performed                     Past Medical History:  Diagnosis Date   Allergy    seasonal   Arthritis    CKD (chronic kidney disease)    DDD (degenerative disc disease)    Degenerative disc disease, lumbar    Hypertension    IBS (irritable bowel syndrome)    Osteopenia    Osteoporosis    osteopenia   Stroke (HCC) 09/2019   Past Surgical History:  Procedure Laterality Date   SHOULDER SURGERY Left    lipoma   TOTAL KNEE ARTHROPLASTY Left 12/26/2022   Procedure: LEFT TOTAL KNEE ARTHROPLASTY;  Surgeon: Harden Jerona GAILS, MD;  Location: [facility] OR;  Service: Orthopedics;  Laterality: Left;   Patient Active Problem List   Diagnosis Date Noted   Total knee replacement status, 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   Rotator cuff syndrome, right 01/11/2020   Labile blood glucose    Vascular headache    Small vessel disease, cerebrovascular    Prediabetes    AKI (acute kidney injury)    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   Acute cerebrovascular accident (CVA) (HCC) 09/30/2019   Unilateral primary osteoarthritis, left knee 03/17/2019   Unilateral primary osteoarthritis, right knee 03/17/2019   Bilateral primary osteoarthritis of knee 03/10/2019   INSOMNIA, CHRONIC 05/19/2007   SPONDYLOSIS, LUMBAR 05/19/2007   Essential hypertension 05/03/2007   ARTHRITIS, KNEE 05/03/2007    PCP: Bridgette Sluder, PA  REFERRING PROVIDER: Valdemar Rocky SAUNDERS, NP  REFERRING DIAG:  [phone] (ICD-10-CM) - Total knee replacement status, left  D16.577J (ICD-10-CM) - Sprain of lateral collateral ligament of left knee, initial encounter    THERAPY DIAG:  Chronic pain of left knee  Muscle weakness (generalized)  Difficulty in walking, not elsewhere classified  Other abnormalities of gait and mobility  Rationale for Evaluation and Treatment: Rehabilitation  ONSET DATE: 12/26/22 A long time  SUBJECTIVE:   SUBJECTIVE STATEMENT: Pt reports she is having a good day. My knees bother me.  EVAL: Pt reports a chronic Hx of knee pain L>R with a TKA of the L completed 5/24. She notes her knees bother her when walking with them starting to be more painful and swollen the past few months. She also endorses low back pain. She feels like thet are now less swollen.   PERTINENT HISTORY: See above. High BMI.  PAIN:  Are you having pain? Yes: NPRS scale: 06/23/2024  8/10   (8/10 right knee, 8/10 left side starting Monday morning)  Pain location: L knee, both sides of the knee Pain description: ache, sharp Aggravating factors: Increased time on feey Relieving factors: Lidocaine  patches  PRECAUTIONS: None  RED FLAGS: None   WEIGHT BEARING RESTRICTIONS: No  FALLS:  Has patient fallen in last 6 months? No  LIVING ENVIRONMENT: Lives with: lives alone  Lives in: House/apartment Stairs: No Has following equipment at home: Single point cane and Walker - 4 wheeled  OCCUPATION: retired  PLOF: Independent with household mobility with device and Independent with community mobility with device  PATIENT GOALS: To walk better s rollator if possible and with less pain  NEXT MD VISIT: Not scheduled  OBJECTIVE:  Note: Objective measures were completed at Evaluation unless otherwise noted.  DIAGNOSTIC FINDINGS:  03/02/24 X-ray L Knee Radiographs of the left knee show stable alignment of total knee  arthroplasty hardware there is no complicating  features.  There is  end-stage osteoarthritis present to the right knee.  With bone-on-bone  contact.   PATIENT SURVEYS:  LEFS: 31/80=39% 05/12/24: 45%   COGNITION: Overall cognitive status: Within functional limits for tasks assessed     SENSATION: WFL  EDEMA:  Swelling present  MUSCLE LENGTH: Hamstrings: Right: Tight deg; Left tight deg Thomas test: Right Tight deg; Left Tight deg  POSTURE: increased lumbar lordosis and flexed trunk   PALPATION: TTP to the peri- L knee  LOWER EXTREMITY ROM:  Active ROM Right eval Left eval Right 03/31/24 Left 03/31/24 Left 05/19/24 RT 06/02/24  Hip flexion        Hip extension        Hip abduction        Hip adduction        Hip internal rotation        Hip external rotation        Knee flexion 90 100 110 110 112 112d/115d  Knee extension 15 lacking 15 lacking      Ankle dorsiflexion        Ankle plantarflexion        Ankle inversion        Ankle eversion         (Blank rows = not tested)  LOWER EXTREMITY MMT:  MMT Right eval Left eval Rt 04/21/24 Lt 04/21/24 Rt Lt Rt/Lt  Hip flexion 3 3 3  pain 3 pain 4+ 4+ 4+/4+  Hip extension 2 2       Hip abduction 3 3 4 4 4 4  4+/4+  Hip adduction         Hip internal rotation         Hip external rotation 3 3 4 4  4+ 4+ 4+/4+  Knee flexion 4 4 4+ 4+ 4+ 5 5/5  Knee extension 4 4 4+ 4+ 4+ 5 5/5  Ankle dorsiflexion         Ankle plantarflexion         Ankle inversion         Ankle eversion          (Blank rows = not tested)   FUNCTIONAL TESTS:  5 times sit to stand: 29.5  c use of hands 2 minute walk test: TBA  03/23/24: 193 feet with SPC, pt with increased SOB due to exertion  05/12/24: 215 feet   GAIT: Distance walked: 100' Assistive device utilized: Environmental Consultant - 4 wheeled Level of assistance: Modified independence Comments: Decreased pace and step length  TREATMENT DATE:  [facility] Adult PT Treatment:                                                DATE: 06/23/24 Therapeutic Exercise/Activities: Nustep L6 x 6 min : 212' LAQ 5# 2 x 20, bil Seated march 5# 2 x 10 Seated HS curl - Blue TB STSx 2x7 SLR 2x10 2.5 #ea Supine clam shell - 2x10 Side hip abduction  x15 each Seated low back stretch 2 x 10  with ball    [facility] Adult PT Treatment:                                                DATE: 06/21/24 Therapeutic Exercise/Activities: Nustep L6 x 6 min LAQ 5# 3 x 20, bil Seated march 4# 3x10 Seated HS curl - Blue TB STSx 2x7 SLR 2x10 ea Supine clam shell - 2x10 Side hip abduction  x15 each Seated low back stretch 2 x 10  with ball    TREATMENT 06/15/24: Aquatic therapy at [facility]- [address] - therapeutic pool temp 90-92 degrees   Aquatic Therapy:  Water walking for warm up fwd/lat/bkwds  Hip abd Mini squat HS curl Heel raise Standing knee ext with noodle Step up bil UE support Lateral walking at pool edge LAQ Hip abd circles   Pt requires the buoyancy of water for active assisted exercises with buoyancy supported for strengthening and AROM exercises. Hydrostatic pressure also supports joints by unweighting joint load by at least 50 % in 3-4 feet depth water. 80% in chest to neck deep water. Water will provide assistance with movement using the current and laminar flow while the buoyancy reduces weight bearing. Pt requires the viscosity of the water for resistance with strengthening exercises.   ASSESSMENT:  CLINICAL IMPRESSION: 06/23/2024: Re-assessed and LE strength. Her was equal to her best effort. Her MMT has continued to improve meeting this goal. Pt tolerated prescribed exs for strength, ROM, and activity tolerance without adverse effects. Pt has 1 more scheduled PT appt and antiicpate DC at that time. Will provide a final HEP.    Jazleen tolerated session well with no adverse reaction.  Pt  innitially reports mod levels of pain and stiffness but this reduces throughout session.  Able to progress intensity or volume of listed exercises with the goal of maximizing strength and ROM.  Pt cued for form and positioning throughout.  EVAL: Patient is a 73 y.o. female who was seen today for physical therapy evaluation and treatment for  Z96.652 (ICD-10-CM) - Total knee replacement status, left  S83.422A (ICD-10-CM) - Sprain of lateral collateral ligament of left knee, initial encounter   Pt presents with decreased LE strength, AROM, and chronic knee pain bilat L>R which is negatively impacting the pt's quality and tolerance for functional mobility. Pt will benefit from skilled PT 2w6 to address impairments to optimize leg function  and mobility with less pain.   OBJECTIVE IMPAIRMENTS: decreased activity tolerance, decreased balance, decreased endurance, decreased mobility, difficulty walking, decreased ROM, decreased strength, and increased edema, and pain.  PERSONAL FACTORS: Age, Fitness, Past/current experiences, Time since onset of injury/illness/exacerbation, and 1-2 comorbidities: high BMI, trunk postural changes are also affecting patient's functional outcome.  REHAB POTENTIAL: Good  CLINICAL DECISION MAKING: Evolving/moderate complexity  EVALUATION COMPLEXITY: Moderate   GOALS:  SHORT TERM GOALS: Target date: 04/08/24 Pt will be Ind in an initial HEP  Baseline: started Goal status: MET  LONG TERM GOALS: Target date: 07/01/24  Pt will be Ind in a final HEP to maintain achieved  LOF  Baseline:  Goal status: INITIAL  2.  Increase pt's bilat knee and hip strengths by 1/2 muscle grade to improve pt's function with daily activities Baseline:  04/21/24: see flow sheets 06/02/24: see flow sheet 06/23/24: see flow sheet Goal status: MET  3.  Increased pt's bilat knee flexion to 105d or greater to improve pt's to obtain sitting positions Baseline: L 100d, R 90d 04/21/24: L 105;  R 105 06/02/24: R 115; L 112 Goal status: MET, continued improvement  4.  Improve 5xSTS by MCID of 5 and by MCID of 80ft as indication of improved functional mobility  Baseline: 5xSTS= 29.5, 193 with SPC 05/10/24: 21.8 sec x use of hands; 191 c SPC limited by knee pain and DOE 06/02/24: 18.8 06/23/24: : 212' Goal status: MET for 5xSTS, Improved  5.  Pt will report 50% or greater improvement in her L knee pain for improved function and QOL Baseline: 4-8/10 05/03/24: Left knee is no longer painful, she is limited by right knee pain  05/10/24: L knee-50%; R 25% Goal status: IMPROVED  6.  Pt's LEFS score will improve by the the MCID to 49% or greater as indication of improved function  Baseline: 39% 05/10/24: 45% Goal status: IMPROVED    PLAN:  PT FREQUENCY: 2x/week  PT DURATION: 6 weeks  PLANNED INTERVENTIONS: 97164- PT Re-evaluation, 97110-Therapeutic exercises, 97530- Therapeutic activity, 97112- Neuromuscular re-education, 97535- Self Care, 02859- Manual therapy, 97116- Gait training, [phone]- Vasopneumatic device, Patient/Family education, Balance training, Stair training, Taping, Joint mobilization, Cryotherapy, and Moist heat, Aquatics  PLAN FOR NEXT SESSION: : assess response to HEP; progress therex as indicated; use of modalities, and manual therapy  Dasie Daft MS, PT 06/23/24 12:26 PM

## 2024-06-23 ENCOUNTER — Ambulatory Visit: Payer: Self-pay

## 2024-06-23 DIAGNOSIS — R2689 Other abnormalities of gait and mobility: Secondary | ICD-10-CM

## 2024-06-23 DIAGNOSIS — G8929 Other chronic pain: Secondary | ICD-10-CM

## 2024-06-23 DIAGNOSIS — R262 Difficulty in walking, not elsewhere classified: Secondary | ICD-10-CM

## 2024-06-23 DIAGNOSIS — M6281 Muscle weakness (generalized): Secondary | ICD-10-CM

## 2024-06-23 DIAGNOSIS — M25562 Pain in left knee: Secondary | ICD-10-CM | POA: Diagnosis not present

## 2024-06-28 ENCOUNTER — Encounter: Payer: Self-pay | Admitting: Physical Therapy

## 2024-06-28 ENCOUNTER — Ambulatory Visit: Payer: Self-pay | Admitting: Physical Therapy

## 2024-06-28 DIAGNOSIS — G8929 Other chronic pain: Secondary | ICD-10-CM

## 2024-06-28 DIAGNOSIS — M25562 Pain in left knee: Secondary | ICD-10-CM | POA: Diagnosis not present

## 2024-06-28 DIAGNOSIS — M6281 Muscle weakness (generalized): Secondary | ICD-10-CM

## 2024-06-28 NOTE — Therapy (Addendum)
 " OUTPATIENT PHYSICAL THERAPY LOWER EXTREMITY TREATMENT/DC     Patient Name: Alison Whitehead MRN: [id number] DOB:Dec 09, 1950, 73 y.o., female Today's Date: 06/28/2024  END OF SESSION:  PT End of Session - 06/28/24 1106     Visit Number 25    Number of Visits 25    Date for Recertification  07/01/24    Authorization Type [employer] MEDICARE HMO/PPO    PT Start Time 1104    PT Stop Time 1145    PT Time Calculation (min) 41 min                     Past Medical History:  Diagnosis Date   Allergy    seasonal   Arthritis    CKD (chronic kidney disease)    DDD (degenerative disc disease)    Degenerative disc disease, lumbar    Hypertension    IBS (irritable bowel syndrome)    Osteopenia    Osteoporosis    osteopenia   Stroke (HCC) 09/2019   Past Surgical History:  Procedure Laterality Date   SHOULDER SURGERY Left    lipoma   TOTAL KNEE ARTHROPLASTY Left 12/26/2022   Procedure: LEFT TOTAL KNEE ARTHROPLASTY;  Surgeon: Harden Jerona GAILS, MD;  Location: [facility] OR;  Service: Orthopedics;  Laterality: Left;   Patient Active Problem List   Diagnosis Date Noted   Total knee replacement status, 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   Rotator cuff syndrome, right 01/11/2020   Labile blood glucose    Vascular headache    Small vessel disease, cerebrovascular    Prediabetes    AKI (acute kidney injury)    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   Acute cerebrovascular accident (CVA) (HCC) 09/30/2019   Unilateral primary osteoarthritis, left knee 03/17/2019   Unilateral primary osteoarthritis, right knee 03/17/2019   Bilateral primary osteoarthritis of knee 03/10/2019   INSOMNIA, CHRONIC 05/19/2007   SPONDYLOSIS, LUMBAR 05/19/2007   Essential hypertension 05/03/2007   ARTHRITIS, KNEE 05/03/2007    PCP: Bridgette Sluder, PA  REFERRING PROVIDER: Valdemar Rocky SAUNDERS, NP  REFERRING DIAG:  [phone] (ICD-10-CM) - Total knee replacement status, left  D16.577J (ICD-10-CM) - Sprain of lateral collateral  ligament of left knee, initial encounter    THERAPY DIAG:  Chronic pain of left knee  Muscle weakness (generalized)  Rationale for Evaluation and Treatment: Rehabilitation  ONSET DATE: 12/26/22 A long time  SUBJECTIVE:   SUBJECTIVE STATEMENT: Pt reports both of her knees still bother her on a regular basis. She was seeing improvement in the left knee however it has started hurting again recently.   EVAL: Pt reports a chronic Hx of knee pain L>R with a TKA of the L completed 5/24. She notes her knees bother her when walking with them starting to be more painful and swollen the past few months. She also endorses low back pain. She feels like thet are now less swollen.   PERTINENT HISTORY: See above. High BMI.  PAIN:  Are you having pain? Yes: NPRS scale: 06/28/2024  8/10   (8/10 right knee, 8/10 left side starting Monday morning)  Pain location: L knee, both sides of the knee Pain description: ache, sharp Aggravating factors: Increased time on feey Relieving factors: Lidocaine  patches  PRECAUTIONS: None  RED FLAGS: None   WEIGHT BEARING RESTRICTIONS: No  FALLS:  Has patient fallen in last 6 months? No  LIVING ENVIRONMENT: Lives with: lives alone Lives in: House/apartment Stairs: No Has following equipment at home: Single point cane and Environmental Consultant - 4 wheeled  OCCUPATION: retired  PLOF: Independent with household mobility with device and Independent with community mobility with device  PATIENT GOALS: To walk better s rollator if possible and with less pain  NEXT MD VISIT: Not scheduled  OBJECTIVE:  Note: Objective measures were completed at Evaluation unless otherwise noted.  DIAGNOSTIC FINDINGS:  03/02/24 X-ray L Knee Radiographs of the left knee show stable alignment of total knee  arthroplasty hardware there is no complicating features.  There is  end-stage osteoarthritis present to the right knee.  With bone-on-bone  contact.   PATIENT SURVEYS:  LEFS:  31/80=39% 05/12/24: 45% 06/28/24: 48/80=60%   COGNITION: Overall cognitive status: Within functional limits for tasks assessed     SENSATION: WFL  EDEMA:  Swelling present  MUSCLE LENGTH: Hamstrings: Right: Tight deg; Left tight deg Debby test: Right Tight deg; Left Tight deg  POSTURE: increased lumbar lordosis and flexed trunk   PALPATION: TTP to the peri- L knee  LOWER EXTREMITY ROM:  Active ROM Right eval Left eval Right 03/31/24 Left 03/31/24 Left 05/19/24 RT 06/02/24  Hip flexion        Hip extension        Hip abduction        Hip adduction        Hip internal rotation        Hip external rotation        Knee flexion 90 100 110 110 112 112d/115d  Knee extension 15 lacking 15 lacking      Ankle dorsiflexion        Ankle plantarflexion        Ankle inversion        Ankle eversion         (Blank rows = not tested)  LOWER EXTREMITY MMT:  MMT Right eval Left eval Rt 04/21/24 Lt 04/21/24 Rt Lt Rt/Lt  Hip flexion 3 3 3  pain 3 pain 4+ 4+ 4+/4+  Hip extension 2 2       Hip abduction 3 3 4 4 4 4  4+/4+  Hip adduction         Hip internal rotation         Hip external rotation 3 3 4 4  4+ 4+ 4+/4+  Knee flexion 4 4 4+ 4+ 4+ 5 5/5  Knee extension 4 4 4+ 4+ 4+ 5 5/5  Ankle dorsiflexion         Ankle plantarflexion         Ankle inversion         Ankle eversion          (Blank rows = not tested)   FUNCTIONAL TESTS:  5 times sit to stand: 29.5  c use of hands 2 minute walk test: TBA  03/23/24: 193 feet with SPC, pt with increased SOB due to exertion  05/12/24: 215 feet   GAIT: Distance walked: 100' Assistive device utilized: Environmental Consultant - 4 wheeled Level of assistance: Modified independence Comments: Decreased pace and step length  TREATMENT DATE:  [facility] Adult PT Treatment:                                                DATE:  06/28/24 Therapeutic Exercise: Review of HEP       ASSESSMENT:  CLINICAL IMPRESSION: 06/28/2024: pt arrived for her last PT session. Focus of session was to review HEP and review benefits of starting a gym program. She has a [employer] and access to [employer] which she has recently tried and found beneficial. She has made improvements in strength, ROM, 5 x STS and LEFS, meeting most of her LTGS. She does however report that her left knee has recently started hurting more again. She has completed 25 visits and has a comprehensive HEP to continue on her own. At this time she is appropriate and agreeable to Discharge.    EVAL: Patient is a 73 y.o. female who was seen today for physical therapy evaluation and treatment for  Z96.652 (ICD-10-CM) - Total knee replacement status, left  S83.422A (ICD-10-CM) - Sprain of lateral collateral ligament of left knee, initial encounter   Pt presents with decreased LE strength, AROM, and chronic knee pain bilat L>R which is negatively impacting the pt's quality and tolerance for functional mobility. Pt will benefit from skilled PT 2w6 to address impairments to optimize leg function  and mobility with less pain.   OBJECTIVE IMPAIRMENTS: decreased activity tolerance, decreased balance, decreased endurance, decreased mobility, difficulty walking, decreased ROM, decreased strength, and increased edema, and pain.  PERSONAL FACTORS: Age, Fitness, Past/current experiences, Time since onset of injury/illness/exacerbation, and 1-2 comorbidities: high BMI, trunk postural changes are also affecting patient's functional outcome.   REHAB POTENTIAL: Good  CLINICAL DECISION MAKING: Evolving/moderate complexity  EVALUATION COMPLEXITY: Moderate   GOALS:  SHORT TERM GOALS: Target date: 04/08/24 Pt will be Ind in an initial HEP  Baseline: started Goal status: MET  LONG TERM GOALS: Target date: 07/01/24  Pt will be Ind in a final HEP to maintain achieved  LOF   Baseline:  Goal status: MET   2.  Increase pt's bilat knee and hip strengths by 1/2 muscle grade to improve pt's function with daily activities Baseline:  04/21/24: see flow sheets 06/02/24: see flow sheet 06/23/24: see flow sheet Goal status: MET  3.  Increased pt's bilat knee flexion to 105d or greater to improve pt's to obtain sitting positions Baseline: L 100d, R 90d 04/21/24: L 105; R 105 06/02/24: R 115; L 112 Goal status: MET, continued improvement  4.  Improve 5xSTS by MCID of 5 and by MCID of 76ft as indication of improved functional mobility  Baseline: 5xSTS= 29.5, 193 with SPC 05/10/24: 21.8 sec x use of hands; 191 c SPC limited by knee pain and DOE 06/02/24: 18.8 06/23/24: : 212' Goal status: MET for 5xSTS, Improved  5.  Pt will report 50% or greater improvement in her L knee pain for improved function and QOL Baseline: 4-8/10 05/03/24: Left knee is no longer painful, she is limited by right knee pain  05/10/24: L knee-50%; R 25% 06/28/24: Was improved however now both knees have been hurting last 2 weeks  Goal status: IMPROVED  6.  Pt's LEFS score will improve by the the MCID to 49% or greater as indication of improved function  Baseline: 39% 05/10/24: 45% 06/28/24: 60%  Goal  status: MET    PLAN:  PT FREQUENCY: 2x/week  PT DURATION: 6 weeks  PLANNED INTERVENTIONS: 97164- PT Re-evaluation, 97110-Therapeutic exercises, 97530- Therapeutic activity, 97112- Neuromuscular re-education, 97535- Self Care, [id number]- Manual therapy, [phone]- Gait training, [phone]- Vasopneumatic device, Patient/Family education, Balance training, Stair training, Taping, Joint mobilization, Cryotherapy, and Moist heat, Aquatics   Harlene Persons, PTA 06/28/24 12:15 PM Phone: [phone] Fax: [phone]   PHYSICAL THERAPY DISCHARGE SUMMARY  Visits from Start of Care: 25  Current functional level related to goals / functional outcomes: See clinical impression and PT  goals   Remaining deficits: See clinical impression and PT goals   Education / Equipment: HEP/Pt Ed   Patient agrees to discharge. Patient goals were Mostly MET. Patient is being discharged due to maximized rehab potential.   Dasie Daft MS, PT 08/02/2024 3:13 PM             "

## 2024-06-29 ENCOUNTER — Ambulatory Visit: Payer: Self-pay | Admitting: Physical Therapy

## 2024-07-12 ENCOUNTER — Emergency Department (HOSPITAL_COMMUNITY)

## 2024-07-12 ENCOUNTER — Inpatient Hospital Stay (HOSPITAL_COMMUNITY)

## 2024-07-12 ENCOUNTER — Inpatient Hospital Stay (HOSPITAL_COMMUNITY)
Admission: EM | Admit: 2024-07-12 | Discharge: 2024-07-26 | DRG: 682 | Disposition: A | Discharge status: Skilled Nursing Facility | Attending: Internal Medicine | Admitting: Internal Medicine

## 2024-07-12 DIAGNOSIS — G928 Other toxic encephalopathy: Secondary | ICD-10-CM | POA: Diagnosis present

## 2024-07-12 DIAGNOSIS — N179 Acute kidney failure, unspecified: Secondary | ICD-10-CM | POA: Diagnosis not present

## 2024-07-12 DIAGNOSIS — M810 Age-related osteoporosis without current pathological fracture: Secondary | ICD-10-CM | POA: Diagnosis present

## 2024-07-12 DIAGNOSIS — G8929 Other chronic pain: Secondary | ICD-10-CM | POA: Diagnosis present

## 2024-07-12 DIAGNOSIS — E1122 Type 2 diabetes mellitus with diabetic chronic kidney disease: Secondary | ICD-10-CM | POA: Diagnosis present

## 2024-07-12 DIAGNOSIS — E875 Hyperkalemia: Secondary | ICD-10-CM | POA: Diagnosis present

## 2024-07-12 DIAGNOSIS — R571 Hypovolemic shock: Secondary | ICD-10-CM | POA: Diagnosis present

## 2024-07-12 DIAGNOSIS — D631 Anemia in chronic kidney disease: Secondary | ICD-10-CM | POA: Diagnosis present

## 2024-07-12 DIAGNOSIS — D696 Thrombocytopenia, unspecified: Secondary | ICD-10-CM | POA: Diagnosis not present

## 2024-07-12 DIAGNOSIS — M1711 Unilateral primary osteoarthritis, right knee: Secondary | ICD-10-CM | POA: Diagnosis not present

## 2024-07-12 DIAGNOSIS — Z7982 Long term (current) use of aspirin: Secondary | ICD-10-CM

## 2024-07-12 DIAGNOSIS — N1832 Chronic kidney disease, stage 3b: Secondary | ICD-10-CM | POA: Diagnosis present

## 2024-07-12 DIAGNOSIS — E785 Hyperlipidemia, unspecified: Secondary | ICD-10-CM | POA: Diagnosis present

## 2024-07-12 DIAGNOSIS — M51369 Other intervertebral disc degeneration, lumbar region without mention of lumbar back pain or lower extremity pain: Secondary | ICD-10-CM | POA: Diagnosis present

## 2024-07-12 DIAGNOSIS — W449XXA Unspecified foreign body entering into or through a natural orifice, initial encounter: Secondary | ICD-10-CM | POA: Diagnosis present

## 2024-07-12 DIAGNOSIS — Z880 Allergy status to penicillin: Secondary | ICD-10-CM

## 2024-07-12 DIAGNOSIS — Z7984 Long term (current) use of oral hypoglycemic drugs: Secondary | ICD-10-CM

## 2024-07-12 DIAGNOSIS — Z96652 Presence of left artificial knee joint: Secondary | ICD-10-CM | POA: Diagnosis present

## 2024-07-12 DIAGNOSIS — Z1152 Encounter for screening for COVID-19: Secondary | ICD-10-CM | POA: Diagnosis not present

## 2024-07-12 DIAGNOSIS — Z7409 Other reduced mobility: Secondary | ICD-10-CM | POA: Diagnosis present

## 2024-07-12 DIAGNOSIS — I10 Essential (primary) hypertension: Secondary | ICD-10-CM | POA: Diagnosis not present

## 2024-07-12 DIAGNOSIS — E66812 Obesity, class 2: Secondary | ICD-10-CM | POA: Diagnosis present

## 2024-07-12 DIAGNOSIS — Z8673 Personal history of transient ischemic attack (TIA), and cerebral infarction without residual deficits: Secondary | ICD-10-CM

## 2024-07-12 DIAGNOSIS — T68XXXA Hypothermia, initial encounter: Secondary | ICD-10-CM | POA: Diagnosis not present

## 2024-07-12 DIAGNOSIS — Z7985 Long-term (current) use of injectable non-insulin antidiabetic drugs: Secondary | ICD-10-CM

## 2024-07-12 DIAGNOSIS — R579 Shock, unspecified: Secondary | ICD-10-CM | POA: Diagnosis not present

## 2024-07-12 DIAGNOSIS — R5381 Other malaise: Secondary | ICD-10-CM | POA: Diagnosis not present

## 2024-07-12 DIAGNOSIS — E669 Obesity, unspecified: Secondary | ICD-10-CM | POA: Diagnosis not present

## 2024-07-12 DIAGNOSIS — T184XXA Foreign body in colon, initial encounter: Secondary | ICD-10-CM | POA: Diagnosis present

## 2024-07-12 DIAGNOSIS — I129 Hypertensive chronic kidney disease with stage 1 through stage 4 chronic kidney disease, or unspecified chronic kidney disease: Secondary | ICD-10-CM | POA: Diagnosis present

## 2024-07-12 DIAGNOSIS — E8770 Fluid overload, unspecified: Secondary | ICD-10-CM | POA: Diagnosis not present

## 2024-07-12 DIAGNOSIS — E871 Hypo-osmolality and hyponatremia: Secondary | ICD-10-CM | POA: Diagnosis present

## 2024-07-12 DIAGNOSIS — R7303 Prediabetes: Secondary | ICD-10-CM

## 2024-07-12 DIAGNOSIS — G9340 Encephalopathy, unspecified: Secondary | ICD-10-CM

## 2024-07-12 DIAGNOSIS — Z8249 Family history of ischemic heart disease and other diseases of the circulatory system: Secondary | ICD-10-CM

## 2024-07-12 DIAGNOSIS — R680 Hypothermia, not associated with low environmental temperature: Secondary | ICD-10-CM | POA: Diagnosis present

## 2024-07-12 DIAGNOSIS — Z6837 Body mass index (BMI) 37.0-37.9, adult: Secondary | ICD-10-CM

## 2024-07-12 DIAGNOSIS — M170 Bilateral primary osteoarthritis of knee: Secondary | ICD-10-CM | POA: Diagnosis present

## 2024-07-12 DIAGNOSIS — Z87891 Personal history of nicotine dependence: Secondary | ICD-10-CM

## 2024-07-12 DIAGNOSIS — Z79899 Other long term (current) drug therapy: Secondary | ICD-10-CM

## 2024-07-12 DIAGNOSIS — G9349 Other encephalopathy: Secondary | ICD-10-CM

## 2024-07-12 DIAGNOSIS — D638 Anemia in other chronic diseases classified elsewhere: Secondary | ICD-10-CM | POA: Diagnosis not present

## 2024-07-12 DIAGNOSIS — Z833 Family history of diabetes mellitus: Secondary | ICD-10-CM

## 2024-07-12 DIAGNOSIS — K589 Irritable bowel syndrome without diarrhea: Secondary | ICD-10-CM | POA: Diagnosis present

## 2024-07-12 DIAGNOSIS — R531 Weakness: Secondary | ICD-10-CM

## 2024-07-12 DIAGNOSIS — E872 Acidosis, unspecified: Secondary | ICD-10-CM | POA: Diagnosis present

## 2024-07-12 DIAGNOSIS — G9341 Metabolic encephalopathy: Secondary | ICD-10-CM

## 2024-07-12 DIAGNOSIS — Z781 Physical restraint status: Secondary | ICD-10-CM

## 2024-07-12 DIAGNOSIS — N170 Acute kidney failure with tubular necrosis: Principal | ICD-10-CM | POA: Diagnosis present

## 2024-07-12 LAB — CBC WITH DIFFERENTIAL/PLATELET
Abs Immature Granulocytes: 0.11 K/uL — ABNORMAL HIGH (ref 0.00–0.07)
Basophils Absolute: 0 K/uL (ref 0.0–0.1)
Basophils Relative: 0 %
Eosinophils Absolute: 0 K/uL (ref 0.0–0.5)
Eosinophils Relative: 0 %
HCT: 30.8 % — ABNORMAL LOW (ref 36.0–46.0)
Hemoglobin: 9.3 g/dL — ABNORMAL LOW (ref 12.0–15.0)
Immature Granulocytes: 2 %
Lymphocytes Relative: 10 %
Lymphs Abs: 0.5 K/uL — ABNORMAL LOW (ref 0.7–4.0)
MCH: 27.2 pg (ref 26.0–34.0)
MCHC: 30.2 g/dL (ref 30.0–36.0)
MCV: 90.1 fL (ref 80.0–100.0)
Monocytes Absolute: 0.3 K/uL (ref 0.1–1.0)
Monocytes Relative: 7 %
Neutro Abs: 3.9 K/uL (ref 1.7–7.7)
Neutrophils Relative %: 81 %
Platelets: 235 K/uL (ref 150–400)
RBC: 3.42 MIL/uL — ABNORMAL LOW (ref 3.87–5.11)
RDW: 16.5 % — ABNORMAL HIGH (ref 11.5–15.5)
WBC: 4.9 K/uL (ref 4.0–10.5)
nRBC: 0.6 % — ABNORMAL HIGH (ref 0.0–0.2)

## 2024-07-12 LAB — I-STAT CHEM 8, ED
BUN: >130 mg/dL — ABNORMAL HIGH (ref 8–23)
Calcium, Ion: 0.85 mmol/L — CL (ref 1.15–1.40)
Chloride: 106 mmol/L (ref 98–111)
Creatinine, Ser: 18 mg/dL — ABNORMAL HIGH (ref 0.44–1.00)
Glucose, Bld: 89 mg/dL (ref 70–99)
HCT: 30 % — ABNORMAL LOW (ref 36.0–46.0)
Hemoglobin: 10.2 g/dL — ABNORMAL LOW (ref 12.0–15.0)
Potassium: 7.9 mmol/L (ref 3.5–5.1)
Sodium: 133 mmol/L — ABNORMAL LOW (ref 135–145)
TCO2: 10 mmol/L — ABNORMAL LOW (ref 22–32)

## 2024-07-12 LAB — COMPREHENSIVE METABOLIC PANEL WITH GFR
ALT: 14 U/L (ref 0–44)
AST: 20 U/L (ref 15–41)
Albumin: 3.3 g/dL — ABNORMAL LOW (ref 3.5–5.0)
Alkaline Phosphatase: 67 U/L (ref 38–126)
Anion gap: 27 — ABNORMAL HIGH (ref 5–15)
BUN: 187 mg/dL — ABNORMAL HIGH (ref 8–23)
CO2: 8 mmol/L — ABNORMAL LOW (ref 22–32)
Calcium: 7 mg/dL — ABNORMAL LOW (ref 8.9–10.3)
Chloride: 101 mmol/L (ref 98–111)
Creatinine, Ser: 16.15 mg/dL — ABNORMAL HIGH (ref 0.44–1.00)
GFR, Estimated: 2 mL/min — ABNORMAL LOW (ref >60)
Glucose, Bld: 90 mg/dL (ref 70–99)
Potassium: >7.5 mmol/L (ref 3.5–5.1)
Sodium: 136 mmol/L (ref 135–145)
Total Bilirubin: 0.7 mg/dL (ref 0.0–1.2)
Total Protein: 6.6 g/dL (ref 6.5–8.1)

## 2024-07-12 LAB — PROTIME-INR
INR: 1.2 (ref 0.8–1.2)
Prothrombin Time: 15.6 s — ABNORMAL HIGH (ref 11.4–15.2)

## 2024-07-12 LAB — URINALYSIS, W/ REFLEX TO CULTURE (INFECTION SUSPECTED)
Bilirubin Urine: NEGATIVE
Glucose, UA: NEGATIVE mg/dL
Ketones, ur: NEGATIVE mg/dL
Nitrite: NEGATIVE
Protein, ur: 100 mg/dL — AB
Specific Gravity, Urine: 1.024 (ref 1.005–1.030)
pH: 5 (ref 5.0–8.0)

## 2024-07-12 LAB — I-STAT VENOUS BLOOD GAS, ED
Acid-base deficit: 19 mmol/L — ABNORMAL HIGH (ref 0.0–2.0)
Bicarbonate: 8.4 mmol/L — ABNORMAL LOW (ref 20.0–28.0)
Calcium, Ion: 0.82 mmol/L — CL (ref 1.15–1.40)
HCT: 30 % — ABNORMAL LOW (ref 36.0–46.0)
Hemoglobin: 10.2 g/dL — ABNORMAL LOW (ref 12.0–15.0)
O2 Saturation: 99 %
Potassium: 8.3 mmol/L (ref 3.5–5.1)
Sodium: 131 mmol/L — ABNORMAL LOW (ref 135–145)
TCO2: 9 mmol/L — ABNORMAL LOW (ref 22–32)
pCO2, Ven: 25.8 mmHg — ABNORMAL LOW (ref 44–60)
pH, Ven: 7.118 — CL (ref 7.25–7.43)
pO2, Ven: 172 mmHg — ABNORMAL HIGH (ref 32–45)

## 2024-07-12 LAB — RESP PANEL BY RT-PCR (RSV, FLU A&B, COVID)  RVPGX2
Influenza A by PCR: NEGATIVE
Influenza B by PCR: NEGATIVE
Resp Syncytial Virus by PCR: NEGATIVE
SARS Coronavirus 2 by RT PCR: NEGATIVE

## 2024-07-12 LAB — MAGNESIUM: Magnesium: 2.9 mg/dL — ABNORMAL HIGH (ref 1.7–2.4)

## 2024-07-12 LAB — LIPASE, BLOOD: Lipase: 46 U/L (ref 11–51)

## 2024-07-12 LAB — TROPONIN I (HIGH SENSITIVITY)
Troponin I (High Sensitivity): 11 ng/L (ref <18)
Troponin I (High Sensitivity): 10 ng/L (ref <18)

## 2024-07-12 LAB — I-STAT CG4 LACTIC ACID, ED
Lactic Acid, Venous: 0.8 mmol/L (ref 0.5–1.9)
Lactic Acid, Venous: 1.6 mmol/L (ref 0.5–1.9)

## 2024-07-12 MED ORDER — SODIUM CHLORIDE 0.9 % IV SOLN
2.0000 g | Freq: Two times a day (BID) | INTRAVENOUS | Status: DC
  Administered 2024-07-13 – 2024-07-15 (×5): 2 g via INTRAVENOUS
  Filled 2024-07-12 (×5): qty 12.5

## 2024-07-12 MED ORDER — DOCUSATE SODIUM 100 MG PO CAPS
100.0000 mg | ORAL_CAPSULE | Freq: Two times a day (BID) | ORAL | Status: DC | PRN
  Administered 2024-07-21: 09:00:00 100 mg via ORAL
  Filled 2024-07-12: qty 1

## 2024-07-12 MED ORDER — HEPARIN SODIUM (PORCINE) 1000 UNIT/ML DIALYSIS
1000.0000 [IU] | INTRAMUSCULAR | Status: DC | PRN
  Administered 2024-07-13: 2800 [IU] via INTRAVENOUS_CENTRAL
  Filled 2024-07-12: qty 6
  Filled 2024-07-12: qty 4
  Filled 2024-07-12: qty 3

## 2024-07-12 MED ORDER — SODIUM BICARBONATE 8.4 % IV SOLN
100.0000 meq | Freq: Once | INTRAVENOUS | Status: AC
  Administered 2024-07-12: 100 meq via INTRAVENOUS
  Filled 2024-07-12: qty 50

## 2024-07-12 MED ORDER — PRISMASOL BGK 0/2.5 32-2.5 MEQ/L EC SOLN: Status: DC

## 2024-07-12 MED ORDER — SODIUM BICARBONATE 8.4 % IV SOLN
INTRAVENOUS | Status: DC
  Filled 2024-07-12 (×2): qty 150

## 2024-07-12 MED ORDER — CHLORHEXIDINE GLUCONATE CLOTH 2 % EX PADS
6.0000 | MEDICATED_PAD | Freq: Every day | CUTANEOUS | Status: DC
  Administered 2024-07-13 – 2024-07-22 (×6): 6 via TOPICAL

## 2024-07-12 MED ORDER — METRONIDAZOLE 500 MG/100ML IV SOLN
500.0000 mg | Freq: Two times a day (BID) | INTRAVENOUS | Status: DC
  Administered 2024-07-13 – 2024-07-15 (×5): 500 mg via INTRAVENOUS
  Filled 2024-07-12 (×5): qty 100

## 2024-07-12 MED ORDER — FUROSEMIDE 10 MG/ML IJ SOLN
60.0000 mg | Freq: Once | INTRAMUSCULAR | Status: AC
  Administered 2024-07-12: 60 mg via INTRAVENOUS
  Filled 2024-07-12: qty 6

## 2024-07-12 MED ORDER — DEXTROSE 50 % IV SOLN
25.0000 g | Freq: Once | INTRAVENOUS | Status: AC
  Administered 2024-07-12: 25 g via INTRAVENOUS
  Filled 2024-07-12: qty 50

## 2024-07-12 MED ORDER — ALBUTEROL SULFATE (2.5 MG/3ML) 0.083% IN NEBU
2.5000 mg | INHALATION_SOLUTION | Freq: Once | RESPIRATORY_TRACT | Status: AC
  Administered 2024-07-12: 2.5 mg via RESPIRATORY_TRACT
  Filled 2024-07-12: qty 3

## 2024-07-12 MED ORDER — LORAZEPAM 2 MG/ML IJ SOLN
0.5000 mg | Freq: Once | INTRAMUSCULAR | Status: AC
  Administered 2024-07-12: 0.5 mg via INTRAVENOUS
  Filled 2024-07-12: qty 1

## 2024-07-12 MED ORDER — PRISMASOL BGK 2/3.5 32-2-3.5 MEQ/L EC SOLN: Status: DC

## 2024-07-12 MED ORDER — DEXMEDETOMIDINE HCL IN NACL 400 MCG/100ML IV SOLN
0.0000 ug/kg/h | INTRAVENOUS | Status: DC
  Administered 2024-07-12: 0.4 ug/kg/h via INTRAVENOUS
  Administered 2024-07-13 (×2): 1.4 ug/kg/h via INTRAVENOUS
  Administered 2024-07-13: 0.9 ug/kg/h via INTRAVENOUS
  Administered 2024-07-13: 0.5 ug/kg/h via INTRAVENOUS
  Administered 2024-07-13: 0.6 ug/kg/h via INTRAVENOUS
  Administered 2024-07-14: 1.599 ug/kg/h via INTRAVENOUS
  Administered 2024-07-14: 1.6 ug/kg/h via INTRAVENOUS
  Administered 2024-07-14 (×2): 1.599 ug/kg/h via INTRAVENOUS
  Administered 2024-07-14 (×3): 1.6 ug/kg/h via INTRAVENOUS
  Administered 2024-07-14: 1.599 ug/kg/h via INTRAVENOUS
  Administered 2024-07-15: 1 ug/kg/h via INTRAVENOUS
  Administered 2024-07-15 (×2): 1.6 ug/kg/h via INTRAVENOUS
  Administered 2024-07-15: 1 ug/kg/h via INTRAVENOUS
  Administered 2024-07-15: 0.9 ug/kg/h via INTRAVENOUS
  Administered 2024-07-15: 1.6 ug/kg/h via INTRAVENOUS
  Administered 2024-07-15: 0.7 ug/kg/h via INTRAVENOUS
  Administered 2024-07-15: 1.5 ug/kg/h via INTRAVENOUS
  Administered 2024-07-16: 1.1 ug/kg/h via INTRAVENOUS
  Administered 2024-07-16: 1 ug/kg/h via INTRAVENOUS
  Filled 2024-07-12 (×15): qty 100
  Filled 2024-07-12: qty 200
  Filled 2024-07-12 (×8): qty 100

## 2024-07-12 MED ORDER — NOREPINEPHRINE 4 MG/250ML-% IV SOLN
0.0000 ug/min | INTRAVENOUS | Status: DC
  Administered 2024-07-12: 2 ug/min via INTRAVENOUS
  Filled 2024-07-12: qty 250

## 2024-07-12 MED ORDER — CALCIUM GLUCONATE-NACL 2-0.675 GM/100ML-% IV SOLN
2.0000 g | Freq: Once | INTRAVENOUS | Status: AC
  Administered 2024-07-13: 2000 mg via INTRAVENOUS
  Filled 2024-07-12 (×2): qty 100

## 2024-07-12 MED ORDER — FAMOTIDINE IN NACL 20-0.9 MG/50ML-% IV SOLN
20.0000 mg | INTRAVENOUS | Status: DC
  Administered 2024-07-13: 20 mg via INTRAVENOUS
  Filled 2024-07-12: qty 50

## 2024-07-12 MED ORDER — LACTATED RINGERS IV BOLUS (SEPSIS)
1000.0000 mL | Freq: Once | INTRAVENOUS | Status: AC
  Administered 2024-07-12: 1000 mL via INTRAVENOUS

## 2024-07-12 MED ORDER — CALCIUM GLUCONATE-NACL 1-0.675 GM/50ML-% IV SOLN
1.0000 g | Freq: Once | INTRAVENOUS | Status: AC
  Administered 2024-07-12: 1000 mg via INTRAVENOUS
  Filled 2024-07-12: qty 50

## 2024-07-12 MED ORDER — HEPARIN SODIUM (PORCINE) 5000 UNIT/ML IJ SOLN
5000.0000 [IU] | Freq: Three times a day (TID) | INTRAMUSCULAR | Status: DC
  Administered 2024-07-13 – 2024-07-26 (×38): 5000 [IU] via SUBCUTANEOUS
  Filled 2024-07-12 (×39): qty 1

## 2024-07-12 MED ORDER — SODIUM CHLORIDE 0.9 % IV BOLUS: 500.0000 mL | Freq: Once | INTRAVENOUS | Status: DC

## 2024-07-12 MED ORDER — SODIUM ZIRCONIUM CYCLOSILICATE 10 G PO PACK
10.0000 g | PACK | Freq: Once | ORAL | Status: AC
  Administered 2024-07-12: 10 g via ORAL
  Filled 2024-07-12: qty 1

## 2024-07-12 MED ORDER — SODIUM BICARBONATE 8.4 % IV SOLN
100.0000 meq | Freq: Once | INTRAVENOUS | Status: AC
  Administered 2024-07-12: 100 meq via INTRAVENOUS

## 2024-07-12 MED ORDER — POLYETHYLENE GLYCOL 3350 17 G PO PACK
17.0000 g | PACK | Freq: Every day | ORAL | Status: DC | PRN
  Administered 2024-07-21: 09:00:00 17 g via ORAL
  Filled 2024-07-12: qty 1

## 2024-07-12 MED ORDER — INSULIN ASPART 100 UNIT/ML IJ SOLN
10.0000 [IU] | Freq: Once | INTRAMUSCULAR | Status: AC
  Administered 2024-07-12: 10 [IU] via INTRAVENOUS
  Filled 2024-07-12: qty 10

## 2024-07-12 MED ORDER — VASOPRESSIN 20 UNITS/100 ML INFUSION FOR SHOCK
0.0000 [IU]/min | INTRAVENOUS | Status: DC
  Administered 2024-07-13 – 2024-07-14 (×5): 0.04 [IU]/min via INTRAVENOUS
  Filled 2024-07-12 (×4): qty 100

## 2024-07-12 NOTE — ED Notes (Signed)
 Report given to inpatient RN. Pt is dry and clean prior to take upstairs, unable to get pt to CT due to pt confusion and not staying still. Per ICU provider and ED CN pt to be move to her room on ICU ASAP, central line to be started during ICU.

## 2024-07-12 NOTE — ED Notes (Signed)
 Pt transported to CT ?

## 2024-07-12 NOTE — ED Provider Notes (Signed)
 [facility] EMERGENCY DEPARTMENT AT [facility] Provider Note   CSN: [id number] Arrival date & time: 07/12/24  8167     Patient presents with: No chief complaint on file.   Alison Whitehead is a 73 y.o. female.   73 year old female brought in by EMS for evaluation of altered mental status.  Per EMS report daughter called 911 because patient was sitting at the edge of her bed, did not know the year and very weak.  Per report patient is usually alert and oriented x 4.  Patient is complaining today of abdominal pain.  She is alert and oriented x 2 only.  Further history limited at this time.        Prior to Admission medications   Medication Sig Start Date End Date Taking? Authorizing Provider  amLODipine -olmesartan  (AZOR ) 10-40 MG tablet Take 1 tablet by mouth daily. 06/23/22   [provider]  aspirin  EC 81 MG tablet Take 1 tablet (81 mg total) by mouth daily. 12/13/19   Penumalli, Eduard SAUNDERS, MD  atorvastatin  (LIPITOR) 40 MG tablet Take 1 tablet (40 mg total) by mouth daily. 03/01/21     carvedilol  (COREG ) 25 MG tablet Take 25 mg by mouth 2 (two) times daily. 06/09/22   [provider]  COVID-19 mRNA Vac-TriS, Pfizer, SUSP injection Inject into the muscle. 01/23/21   Luiz Channel, MD  CREON 36000-114000 units CPEP capsule Take 36,000-72,000 Units by mouth See admin instructions. Take 72,000 units by mouth daily with a meal. Take 36,000 units by mouth daily with a snack. 01/22/22   [provider]  cyclobenzaprine  (FLEXERIL ) 5 MG tablet TAKE 1 TABLET BY MOUTH THREE TIMES A DAY AS NEEDED FOR MUSCLE SPASMS 05/06/23   Zamora, Erin R, NP  cyclobenzaprine  (FLEXERIL ) 5 MG tablet Take 1 tablet (5 mg total) by mouth 3 (three) times daily as needed for muscle spasms. 05/06/23   Harden Jerona GAILS, MD  dapagliflozin  propanediol (FARXIGA ) 10 MG TABS tablet Take 1 tablet (10 mg total) by mouth daily. 11/04/21   Dennise Hoes, MD  dicyclomine  (BENTYL ) 10 MG capsule Take 1  to 2 Capsules by mouth four times daily, as needed, FOR IBS Patient taking differently: Take 10-20 mg by mouth 4 (four) times daily as needed for spasms. 01/07/21     diphenhydrAMINE  (BENADRYL ) 25 mg capsule Take 25 mg by mouth daily.    [provider]  ferrous sulfate 325 (65 FE) MG tablet Take 325 mg by mouth 3 (three) times a week. 12/09/22   [provider]  HYDROcodone -acetaminophen  (NORCO) 10-325 MG tablet Take 1 tablet by mouth every 6 (six) hours as needed for moderate pain. 04/21/23   Valdemar Rocky SAUNDERS, NP  Melatonin 5 MG CAPS Take 5 mg by mouth at bedtime.    [provider]  metFORMIN  (GLUCOPHAGE ) 500 MG tablet Take 500 mg by mouth daily. 09/05/20   [provider]  methocarbamol  (ROBAXIN ) 500 MG tablet TAKE 1 TABLET BY MOUTH EVERY 8 HOURS AS NEEDED FOR MUSCLE SPASMS. 03/24/23   Zamora, Erin R, NP  naloxone [facility]) nasal spray 4 mg/0.1 mL SMARTSIG:Both Nares Patient not taking: Reported on 10/22/2022 04/25/22   [provider]  phentermine 30 MG capsule Take 30 mg by mouth daily. 09/09/22   [provider]  RYBELSUS  14 MG TABS Take 14 mg by mouth every evening. 12/20/21   [provider]  torsemide (DEMADEX) 5 MG tablet Take 5 mg by mouth daily. 01/18/22   [provider]  Allergies: Penicillins    Review of Systems  Constitutional:  Positive for fatigue. Negative for chills and fever.  HENT:  Negative for ear pain and sore throat.   Eyes:  Negative for pain and visual disturbance.  Respiratory:  Negative for cough and shortness of breath.   Cardiovascular:  Negative for chest pain and palpitations.  Gastrointestinal:  Positive for abdominal pain. Negative for vomiting.  Genitourinary:  Negative for dysuria and hematuria.  Musculoskeletal:  Negative for arthralgias and back pain.  Skin:  Negative for color change and rash.  Neurological:  Positive for weakness. Negative for seizures and syncope.  All other systems  reviewed and are negative.   Updated Vital Signs BP 96/71   Pulse 78   Temp (!) 92.9 F (33.8 C)   Resp 14   Ht 5' 6 (1.676 m)   Wt 104.3 kg Comment: from 2024 records, to be weighed  SpO2 98%   BMI 37.12 kg/m   Physical Exam Vitals and nursing note reviewed.  Constitutional:      General: She is not in acute distress.    Appearance: Normal appearance. She is well-developed. She is ill-appearing.  HENT:     Head: Normocephalic and atraumatic.  Eyes:     Conjunctiva/sclera: Conjunctivae normal.  Cardiovascular:     Rate and Rhythm: Normal rate and regular rhythm.     Heart sounds: No murmur heard. Pulmonary:     Effort: Pulmonary effort is normal. No respiratory distress.     Breath sounds: Normal breath sounds.  Abdominal:     General: There is distension.     Palpations: Abdomen is soft.     Tenderness: There is abdominal tenderness.  Musculoskeletal:        General: No swelling.     Cervical back: Neck supple.  Skin:    General: Skin is warm and dry.     Capillary Refill: Capillary refill takes less than 2 seconds.  Neurological:     General: No focal deficit present.     Mental Status: She is alert.     (all labs ordered are listed, but only abnormal results are displayed) Labs Reviewed  COMPREHENSIVE METABOLIC PANEL WITH GFR - Abnormal; Notable for the following components:      Result Value   Potassium >7.5 (*)    CO2 8 (*)    BUN 187 (*)    Creatinine, Ser 16.15 (*)    Calcium  7.0 (*)    Albumin 3.3 (*)    GFR, Estimated 2 (*)    Anion gap 27 (*)    All other components within normal limits  CBC WITH DIFFERENTIAL/PLATELET - Abnormal; Notable for the following components:   RBC 3.42 (*)    Hemoglobin 9.3 (*)    HCT 30.8 (*)    RDW 16.5 (*)    nRBC 0.6 (*)    Lymphs Abs 0.5 (*)    Abs Immature Granulocytes 0.11 (*)    All other components within normal limits  PROTIME-INR - Abnormal; Notable for the following components:   Prothrombin Time  15.6 (*)    All other components within normal limits  URINALYSIS, W/ REFLEX TO CULTURE (INFECTION SUSPECTED) - Abnormal; Notable for the following components:   APPearance TURBID (*)    Hgb urine dipstick MODERATE (*)    Protein, ur 100 (*)    Leukocytes,Ua SMALL (*)    Bacteria, UA RARE (*)    Non Squamous Epithelial 0-5 (*)    All  other components within normal limits  MAGNESIUM  - Abnormal; Notable for the following components:   Magnesium  2.9 (*)    All other components within normal limits  I-STAT CHEM 8, ED - Abnormal; Notable for the following components:   Sodium 133 (*)    Potassium 7.9 (*)    BUN >130 (*)    Creatinine, Ser 18.00 (*)    Calcium , Ion 0.85 (*)    TCO2 10 (*)    Hemoglobin 10.2 (*)    HCT 30.0 (*)    All other components within normal limits  I-STAT VENOUS BLOOD GAS, ED - Abnormal; Notable for the following components:   pH, Ven 7.118 (*)    pCO2, Ven 25.8 (*)    pO2, Ven 172 (*)    Bicarbonate 8.4 (*)    TCO2 9 (*)    Acid-base deficit 19.0 (*)    Sodium 131 (*)    Potassium 8.3 (*)    Calcium , Ion 0.82 (*)    HCT 30.0 (*)    Hemoglobin 10.2 (*)    All other components within normal limits  RESP PANEL BY RT-PCR (RSV, FLU A&B, COVID)  RVPGX2  CULTURE, BLOOD (ROUTINE X 2)  CULTURE, BLOOD (ROUTINE X 2)  MRSA NEXT GEN BY PCR, NASAL  URINE CULTURE  LIPASE, BLOOD  RENAL FUNCTION PANEL  MAGNESIUM   CBC  COMPREHENSIVE METABOLIC PANEL WITH GFR  AMMONIA  PROCALCITONIN  TSH  ETHANOL  RAPID URINE DRUG SCREEN, HOSP PERFORMED  HEMOGLOBIN A1C  I-STAT CG4 LACTIC ACID, ED  I-STAT CHEM 8, ED  I-STAT CG4 LACTIC ACID, ED  TROPONIN I (HIGH SENSITIVITY)  TROPONIN I (HIGH SENSITIVITY)    EKG: EKG Interpretation Date/Time:  Tuesday July 12 2024 19:02:08 EST Ventricular Rate:  54 PR Interval:    QRS Duration:  135 QT Interval:  507 QTC Calculation: 481 R Axis:   13  Text Interpretation: Atrial fibrillation Nonspecific intraventricular conduction  delay Compared with prior EKG from 12/22/2022 Confirmed by Gennaro Bouchard ([id number]) on 07/12/2024 7:07:13 PM  Radiology: [facility] Chest Port 1 View Result Date: 07/12/2024 CLINICAL DATA:  Questionable sepsis. EXAM: PORTABLE CHEST 1 VIEW COMPARISON:  Chest radiograph dated 05/20/2016. FINDINGS: Cardiomegaly with vascular congestion. No focal consolidation, pleural effusion, or pneumothorax. No acute osseous pathology. IMPRESSION: Cardiomegaly with vascular congestion. No focal consolidation. Electronically Signed   By: Vanetta Chou M.D.   On: 07/12/2024 19:57     .Critical Care  Performed by: Gennaro Bouchard CROME, DO Authorized by: Gennaro Bouchard CROME, DO   Critical care provider statement:    Critical care time (minutes):  75   Critical care was necessary to treat or prevent imminent or life-threatening deterioration of the following conditions:  Renal failure, shock and endocrine crisis   Critical care was time spent personally by me on the following activities:  Development of treatment plan with patient or surrogate, discussions with consultants, evaluation of patient's response to treatment, examination of patient, ordering and review of laboratory studies, ordering and review of radiographic studies, ordering and performing treatments and interventions, pulse oximetry, re-evaluation of patient's condition and review of old charts   Care discussed with: admitting provider      Medications Ordered in the ED  sodium bicarbonate  150 mEq in dextrose  5 % 1,150 mL infusion ( Intravenous New Bag/Given 07/12/24 2102)  norepinephrine  (LEVOPHED ) 4mg  in (0.016 mg/mL) premix infusion (10 mcg/min Intravenous Rate/Dose Change 07/12/24 2302)  heparin  injection 1,000-6,000 Units (has no administration in time range)  prismasol  BGK 0/2.5 infusion (has no administration in time range)  prismasol  BGK 0/2.5 infusion (has no administration in time range)  PrismaSol  BGK 2/3.5 infusion (has no administration in time  range)  Chlorhexidine  Gluconate Cloth 2 % PADS 6 each (has no administration in time range)  docusate sodium  (COLACE) capsule 100 mg (has no administration in time range)  polyethylene glycol (MIRALAX  / GLYCOLAX ) packet 17 g (has no administration in time range)  heparin  injection 5,000 Units (has no administration in time range)  calcium  gluconate 2 g/ 100 mL sodium chloride  IVPB (has no administration in time range)  dexmedetomidine  (PRECEDEX ) 400 MCG/100ML (4 mcg/mL) infusion (has no administration in time range)  vasopressin  (PITRESSIN) 20 Units in 100 mL (0.2 unit/mL) infusion-*FOR SHOCK* (has no administration in time range)  famotidine  (PEPCID ) IVPB 20 mg premix (has no administration in time range)  lactated ringers  bolus 1,000 mL (0 mLs Intravenous Stopped 07/12/24 2158)  calcium  gluconate 1 g/ 50 mL sodium chloride  IVPB (0 mg Intravenous Stopped 07/12/24 2304)  sodium zirconium cyclosilicate  (LOKELMA ) packet 10 g (10 g Oral Given 07/12/24 2103)  sodium bicarbonate  injection 100 mEq (100 mEq Intravenous Given 07/12/24 2050)  albuterol  (PROVENTIL ) (2.5 MG/3ML) 0.083% nebulizer solution 2.5 mg (2.5 mg Nebulization Given 07/12/24 2050)  dextrose  50 % solution 25 g (25 g Intravenous Given 07/12/24 2050)  insulin  aspart (novoLOG ) injection 10 Units (10 Units Intravenous Given 07/12/24 2114)  furosemide  (LASIX ) injection 60 mg (60 mg Intravenous Given 07/12/24 2104)  LORazepam  (ATIVAN ) injection 0.5 mg (0.5 mg Intravenous Given 07/12/24 2201)  sodium bicarbonate  injection 100 mEq (100 mEq Intravenous Given 07/12/24 2236)                                    Medical Decision Making Cardiac monitor interpretation: Sinus rhythm, no ectopy  Patient brought in for altered mental status and found to be in acute renal failure with significant hyperkalemia.  She is hypotensive and hypothermic as well.  Was placed on a Bair hugger, started on pressors after liter of IV fluid for a few elevated blood  pressure.  As soon as hyperkalemia was noted she was immediately given calcium  gluconate, bicarb, albuterol , dextrose , Lokelma , and insulin .  A bicarb drip was started as she did have a metabolic acidosis.  She is also found to be in acute renal failure nephrology was immediately contacted.  I spoke with Dr. Dennise who came in and saw the patient.  He recommended renal ultrasound as patient was unable to tolerate a CT abdomen pelvis due to being altered/her uremia.  Was given a small dose of Ativan  in the ER as she did become combative and agitated and it was difficult to get her on the monitor obtain IV access for this reason.  Nephrology recommended CRRT.  Critical care was consulted and the PA came down and saw the patient and will start a trialysis line to do CRRT in the ICU.  I discussed all the results and plan with patient and her family at bedside and they are agreeable with plan for admission.  Problems Addressed: Acute renal failure, unspecified acute renal failure type: acute illness or injury that poses a threat to life or bodily functions Hyperkalemia: acute illness or injury that poses a threat to life or bodily functions Hypothermia, initial encounter: acute illness or injury that poses a threat to life or bodily functions Metabolic acidosis: acute illness or  injury that poses a threat to life or bodily functions Shock [facility]): acute illness or injury that poses a threat to life or bodily functions Uremic encephalopathy: undiagnosed new problem with uncertain prognosis  Amount and/or Complexity of Data Reviewed Independent Historian: EMS    Details: Patient is altered and EMS provided history, they state that daughter, patient altered, patient did not know the daughter's name or the year and usually patient is alert and oriented x 4 External Data Reviewed: notes.    Details: Prior nephrology records reviewed and patient sees Dr. Dennise in the office-she usually only has a creatinine of  1 Labs: ordered. Decision-making details documented in ED Course.    Details: Ordered and reviewed by me and patient found to have acute renal failure and significant hyperkalemia as well as a metabolic acidosis from this Radiology: ordered and independent interpretation performed. Decision-making details documented in ED Course.    Details: Ordered and interpreted by me independently of radiology Chest x-ray: Shows no acute abnormality CT abdomen pelvis ordered and pending ECG/medicine tests: ordered and independent interpretation performed. Decision-making details documented in ED Course.    Details: Ordered and interpreted by me in the absence of cardiology and shows sinus rhythm, slightly prolonged QT but no peaked T waves no STEMI Discussion of management or test interpretation with external provider(s): Dr. Lus spoke with him on the phone and he came to evaluate the patient.  Recommended CRRT and a renal ultrasound  Dr. Emery -ICU-I spoke with him on the phone regarding the patient's case and he will with the patient for further workup and management PA for critical care-I spoke with him at bedside and he we will plan to place a trialysis catheter and they will perform CRRT  Risk OTC drugs. Prescription drug management. Parenteral controlled substances. Drug therapy requiring intensive monitoring for toxicity. Decision regarding hospitalization. Diagnosis or treatment significantly limited by social determinants of health. Risk Details: CRITICAL CARE Performed by: Duwaine LITTIE Fusi   Total critical care time: 75 minutes  Critical care time was exclusive of separately billable procedures and treating other patients.  Critical care was necessary to treat or prevent imminent or life-threatening deterioration.  Critical care was time spent personally by me on the following activities: development of treatment plan with patient and/or surrogate as well as nursing,  discussions with consultants, evaluation of patient's response to treatment, examination of patient, obtaining history from patient or surrogate, ordering and performing treatments and interventions, ordering and review of laboratory studies, ordering and review of radiographic studies, pulse oximetry and re-evaluation of patient's condition.   Critical Care Total time providing critical care: 75 minutes    Final diagnoses:  Acute renal failure, unspecified acute renal failure type  Hyperkalemia  Shock (HCC)  Hypothermia, initial encounter  Metabolic acidosis  Uremic encephalopathy    ED Discharge Orders     None          Fusi Duwaine LITTIE, DO 07/12/24 2344

## 2024-07-12 NOTE — Consult Note (Addendum)
 Nephrology Consult   Requesting provider: Dr. Gennaro Service requesting consult: ER Reason for consult: AKI, hyperkalemia, acidemia  Assessment/Recommendations:   AKI on CKD3b -followed by me as an outpatient. Baseline Cr around 1.4-1.7, last set of labs with me as an OP was on 04/27/24 which showed a Cr of 1.36, eGFR 42, K 4.5 -suspecting etiology to be secondary to ATN secondary to shock -hold SGLT2I, ARB, triamterene -HCTZ -CT abd/pelv pending to rule out obstruction, unable to be done due to agitation. Urine sediment pending -will try for renal ultrasound -at this junction, would recommend starting CRRT. Discussed with daughter at the bedside and she is in agreement. Appreciate CCM's assistance with temp line. Will start with 0k bath in pre and post, 2k in dialysate. UFG: will start with net even x 4 hours followed by slow titration towards neg neg 100cc/hr -will need to be careful in bring her BUN down to help prevent dialysis dysequilibrium syndrome therefore utilizing lower dialysate flows -Avoid nephrotoxic medications including NSAIDs and iodinated intravenous contrast exposure unless the latter is absolutely indicated.  Preferred narcotic agents for pain control are hydromorphone , fentanyl , and methadone. Morphine should not be used. Avoid Baclofen and avoid oral sodium phosphate  and magnesium  citrate based laxatives / bowel preps. Continue strict Input and Output monitoring. Will monitor the patient closely with you and intervene or adjust therapy as indicated by changes in clinical status/labs   Hyperkalemia -s/p shifting agents + lokelma . Laix 60mg  IV x 1 dose to see if this helps with kaliuresis -CRRT as above  Anion Gap Metabolic acidosis/Acidemia -currently on bicarb gtt. Lactate WNL -CRRT start as above, once on CRRT can stop bicarb gtt  Shock -presumed to be septic -on pressors now, s/p 1L of isotonic  Toxic metabolic encephalopathy Uremic encephalopathy -CTH  pending -CRRT as above  Hypothermia -abx/work up per primary service  Anemia -hgb 10.2, acceptable for CKD. Transfuse PRN for Hgb < 7  Hyponatremia -hypervolemic and secondary to severe AKI, lasix  & CRRT as above.   Recommendations conveyed to primary service.  Discussed with ER MD, ER RN, and daughter.   The patient is critically ill with hypotension/shock, altered mental status, hyperkalemia, AKI, acidemia and which includes my role to primarily manage AKI, hyperkalemia, acidemia.  This requires high complexity decision making.  Total critical care time: 28 min    Critical care time was exclusive of treating other patients.   Critical care was necessary to treat or prevent imminent or life-threatening deterioration.   Critical care was time spent personally by me on the following activities:   development of treatment plan with patient and/or surrogate as well as nursing,   discussions with other provider evaluation of patient's response to treatment  examination of patient  obtaining history from patient or surrogate  ordering and performing treatments and interventions  ordering and review of laboratory studies  ordering and review of radiographic studies    Ephriam Stank [facility] 07/12/2024 8:13 PM   _____________________________________________________________________________________   History of Present Illness: Alison Whitehead is a/an 73 y.o. female with a past medical history of CKD3b, HTN, DM2, h/o CVA, obesity, IBS, HLD, chronic anemia who presents to [facility] with confusion. Daughter had called EMS given that patient was sitting at the edge of bed and did not know the year and was very weak. Was found to be hypothermic and hypotensive. Did receive 1L LR. CXR with some vascular congestion. I-stat labs showed a K of 8.3, BUN >130, Cr 18. pH 7.118.  Borderline prolonged QT, no peaked T waves on EKG. Bladder scan with ~50cc of urine. Patient  seen and examined in ER. CT head and CT abd/pelv unable to be done given agitation. Upon my arrival she was quite agitated, currently being restrained. Daughter at bedside. She reports that her symptoms were all of a sudden. They usually talk on the phone every couple of days and may have seemed a 'little off' yesterday. It is unknown if there were any recent medication changes or recent illnesses. Daughter reports that patient did not mention anything about dysuria, hematuria, GI symptoms. ROS unobtainable given agitation/altered mental status. CCM to be consulted by ER given need for pressors and CRRT.  Medications:  Current Facility-Administered Medications  Medication Dose Route Frequency Provider Last Rate Last Admin   albuterol  (PROVENTIL ) (2.5 MG/3ML) 0.083% nebulizer solution 2.5 mg  2.5 mg Nebulization Once Kammerer, Megan L, DO       calcium  gluconate 1 g/ 50 mL sodium chloride  IVPB  1 g Intravenous Once Kammerer, Megan L, DO       dextrose  50 % solution 25 g  25 g Intravenous Once Kammerer, Megan L, DO       diclofenac  Sodium (VOLTAREN ) 1 % topical gel 2 g  2 g Topical QID Harden Jerona GAILS, MD       insulin  aspart (novoLOG ) injection 10 Units  10 Units Intravenous Once Kammerer, Megan L, DO       norepinephrine  (LEVOPHED ) 4mg  in (0.016 mg/mL) premix infusion  0-40 mcg/min Intravenous Continuous Kammerer, Megan L, DO       sodium bicarbonate  150 mEq in dextrose  5 % 1,150 mL infusion   Intravenous Continuous Kammerer, Megan L, DO       sodium bicarbonate  injection 100 mEq  100 mEq Intravenous Once Kammerer, Megan L, DO       sodium zirconium cyclosilicate  (LOKELMA ) packet 10 g  10 g Oral Once Kammerer, Megan L, DO       Current Outpatient Medications  Medication Sig Dispense Refill   amLODipine -olmesartan  (AZOR ) 10-40 MG tablet Take 1 tablet by mouth daily.     aspirin  EC 81 MG tablet Take 1 tablet (81 mg total) by mouth daily.     atorvastatin  (LIPITOR) 40 MG tablet Take 1 tablet  (40 mg total) by mouth daily. 90 tablet 1   carvedilol  (COREG ) 25 MG tablet Take 25 mg by mouth 2 (two) times daily.     COVID-19 mRNA Vac-TriS, Pfizer, SUSP injection Inject into the muscle. 0.3 mL 0   CREON 36000-114000 units CPEP capsule Take 36,000-72,000 Units by mouth See admin instructions. Take 72,000 units by mouth daily with a meal. Take 36,000 units by mouth daily with a snack.     cyclobenzaprine  (FLEXERIL ) 5 MG tablet TAKE 1 TABLET BY MOUTH THREE TIMES A DAY AS NEEDED FOR MUSCLE SPASMS 30 tablet 0   cyclobenzaprine  (FLEXERIL ) 5 MG tablet Take 1 tablet (5 mg total) by mouth 3 (three) times daily as needed for muscle spasms. 30 tablet 0   dapagliflozin  propanediol (FARXIGA ) 10 MG TABS tablet Take 1 tablet (10 mg total) by mouth daily. 90 tablet 3   dicyclomine  (BENTYL ) 10 MG capsule Take 1 to 2 Capsules by mouth four times daily, as needed, FOR IBS (Patient taking differently: Take 10-20 mg by mouth 4 (four) times daily as needed for spasms.) 120 capsule 0   diphenhydrAMINE  (BENADRYL ) 25 mg capsule Take 25 mg by mouth daily.     ferrous  sulfate 325 (65 FE) MG tablet Take 325 mg by mouth 3 (three) times a week.     HYDROcodone -acetaminophen  (NORCO) 10-325 MG tablet Take 1 tablet by mouth every 6 (six) hours as needed for moderate pain. 30 tablet 0   Melatonin 5 MG CAPS Take 5 mg by mouth at bedtime.     metFORMIN  (GLUCOPHAGE ) 500 MG tablet Take 500 mg by mouth daily.     methocarbamol  (ROBAXIN ) 500 MG tablet TAKE 1 TABLET BY MOUTH EVERY 8 HOURS AS NEEDED FOR MUSCLE SPASMS. 30 tablet 0   naloxone (NARCAN) nasal spray 4 mg/0.1 mL SMARTSIG:Both Nares (Patient not taking: Reported on 10/22/2022)     phentermine 30 MG capsule Take 30 mg by mouth daily.     RYBELSUS  14 MG TABS Take 14 mg by mouth every evening.     torsemide (DEMADEX) 5 MG tablet Take 5 mg by mouth daily.       ALLERGIES Penicillins  MEDICAL HISTORY Past Medical History:  Diagnosis Date   Allergy    seasonal    Arthritis    CKD (chronic kidney disease)    DDD (degenerative disc disease)    Degenerative disc disease, lumbar    Hypertension    IBS (irritable bowel syndrome)    Osteopenia    Osteoporosis    osteopenia   Stroke (HCC) 09/2019     SOCIAL HISTORY Social History   Socioeconomic History   Marital status: Widowed    Spouse name: Not on file   Number of children: 2   Years of education: Not on file   Highest education level: Some college, no degree  Occupational History    Comment: retired  Tobacco Use   Smoking status: Former    Current packs/day: 0.00    Types: Cigarettes    Quit date: 10/13/2019    Years since quitting: 4.7   Smokeless tobacco: Never  Vaping Use   Vaping status: Never Used  Substance and Sexual Activity   Alcohol use: Never   Drug use: Never   Sexual activity: Not Currently    Birth control/protection: None  Other Topics Concern   Not on file  Social History Narrative   12/13/19 Lives alone   No caffeine   Social Drivers of Corporate Investment Banker Strain: Not on file  Food Insecurity: Not on file  Transportation Needs: Not on file  Physical Activity: Not on file  Stress: Not on file  Social Connections: Not on file  Intimate Partner Violence: Not on file     FAMILY HISTORY Family History  Problem Relation Age of Onset   Breast cancer Maternal Aunt    Diabetes Mother    Hypertension Mother    Glaucoma Mother    Other Father        accident   Diabetes Sister    Hypertension Sister    Cancer Sister    Colon cancer Neg Hx    Colon polyps Neg Hx    Esophageal cancer Neg Hx    Stomach cancer Neg Hx    Rectal cancer Neg Hx     Review of Systems: Unobtainable given altered mental status  Physical Exam: Vitals:   07/12/24 1912 07/12/24 2006  BP:  106/87  Pulse: (!) 57 66  Resp: 17 16  SpO2: 99% 99%   No intake/output data recorded. No intake or output data in the 24 hours ending 07/12/24 2013 General: ill appearing,  agitated HEENT: anicteric sclera, oropharynx clear without lesions  CV: regular rate, normal rhythm, no murmurs, no gallops, no rubs, no peripheral edema Lungs: increased WOB, diminished air entry bibasilar Abd: soft, non-tender, non-distended Skin: no visible lesions or rashes Psych: agitated Musculoskeletal: no obvious deformities Neuro: agitated, confused, moving/flailing all extremities   Test Results Reviewed Lab Results  Component Value Date   NA 131 (L) 07/12/2024   K 8.3 (HH) 07/12/2024   CL 106 07/12/2024   CO2 17 (L) 12/28/2022   BUN >130 (H) 07/12/2024   CREATININE 18.00 (H) 07/12/2024   CALCIUM  7.9 (L) 12/28/2022   ALBUMIN 3.4 (L) 10/07/2019     I have reviewed all relevant outside healthcare records related to the patient's kidney injury.

## 2024-07-12 NOTE — ED Triage Notes (Signed)
 According to [facility]: Pt is coming from home after daughter called due to pt being AMS. Pt unable to tell what year it was,what year it was, how long she was at the edge of b ed and was sitting on edge of bed like she was going to fall off of bed onto floor when ems arrived. Daughter says pt was under the weather and groggy yesterday but alert and oriented. Now she says her whole body hurts.  Vitals BP 70/50 HR 58 Spo2 96 RR 24 Cbg 86

## 2024-07-12 NOTE — ED Notes (Signed)
 Pt is unable to or unwilling to answer triage questions. Pt keeps repeating questions over and forgetting answer.

## 2024-07-12 NOTE — ED Notes (Addendum)
 Pt jerked out arm during IV placement x2. Pressure and dressing applied to site. Pt no reorientable, when asked to stay still pt says I can't, it hurts. Rectal temp found to be 90.16F. Pt placed on bair hugger. Kammerer DO aware via face to face conversation.

## 2024-07-12 NOTE — ED Notes (Signed)
 Pt very agitated  sine arrival started hitting and kicking attempting to remove lines pt placed on restrain and daughter oriented.

## 2024-07-12 NOTE — ED Notes (Signed)
 Pt's bladder scan about 10-50 mL. Pt moving a lot and this NT was unable to get a clear reading after three attempts. RN aware

## 2024-07-12 NOTE — ED Notes (Signed)
 Pt unable to stay still on CT and refusing to have CT completed.
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
## 2024-07-13 ENCOUNTER — Inpatient Hospital Stay (HOSPITAL_COMMUNITY)

## 2024-07-13 DIAGNOSIS — E875 Hyperkalemia: Secondary | ICD-10-CM

## 2024-07-13 DIAGNOSIS — N179 Acute kidney failure, unspecified: Secondary | ICD-10-CM | POA: Diagnosis not present

## 2024-07-13 DIAGNOSIS — R579 Shock, unspecified: Secondary | ICD-10-CM

## 2024-07-13 DIAGNOSIS — N1832 Chronic kidney disease, stage 3b: Secondary | ICD-10-CM | POA: Diagnosis not present

## 2024-07-13 LAB — RENAL FUNCTION PANEL
Albumin: 2.9 g/dL — ABNORMAL LOW (ref 3.5–5.0)
Albumin: 2.9 g/dL — ABNORMAL LOW (ref 3.5–5.0)
Anion gap: 19 — ABNORMAL HIGH (ref 5–15)
Anion gap: 13 (ref 5–15)
BUN: 131 mg/dL — ABNORMAL HIGH (ref 8–23)
BUN: 92 mg/dL — ABNORMAL HIGH (ref 8–23)
CO2: 19 mmol/L — ABNORMAL LOW (ref 22–32)
CO2: 23 mmol/L (ref 22–32)
Calcium: 6.5 mg/dL — ABNORMAL LOW (ref 8.9–10.3)
Calcium: 6.9 mg/dL — ABNORMAL LOW (ref 8.9–10.3)
Chloride: 98 mmol/L (ref 98–111)
Chloride: 102 mmol/L (ref 98–111)
Creatinine, Ser: 9.99 mg/dL — ABNORMAL HIGH (ref 0.44–1.00)
Creatinine, Ser: 6.43 mg/dL — ABNORMAL HIGH (ref 0.44–1.00)
GFR, Estimated: 4 mL/min — ABNORMAL LOW (ref >60)
GFR, Estimated: 6 mL/min — ABNORMAL LOW (ref >60)
Glucose, Bld: 190 mg/dL — ABNORMAL HIGH (ref 70–99)
Glucose, Bld: 145 mg/dL — ABNORMAL HIGH (ref 70–99)
Phosphorus: 9.7 mg/dL — ABNORMAL HIGH (ref 2.5–4.6)
Phosphorus: 6.5 mg/dL — ABNORMAL HIGH (ref 2.5–4.6)
Potassium: 3.8 mmol/L (ref 3.5–5.1)
Potassium: 4.4 mmol/L (ref 3.5–5.1)
Sodium: 136 mmol/L (ref 135–145)
Sodium: 138 mmol/L (ref 135–145)

## 2024-07-13 LAB — PROCALCITONIN: Procalcitonin: 0.31 ng/mL

## 2024-07-13 LAB — CBC
HCT: 29 % — ABNORMAL LOW (ref 36.0–46.0)
Hemoglobin: 9 g/dL — ABNORMAL LOW (ref 12.0–15.0)
MCH: 27 pg (ref 26.0–34.0)
MCHC: 31 g/dL (ref 30.0–36.0)
MCV: 87.1 fL (ref 80.0–100.0)
Platelets: 221 K/uL (ref 150–400)
RBC: 3.33 MIL/uL — ABNORMAL LOW (ref 3.87–5.11)
RDW: 16.4 % — ABNORMAL HIGH (ref 11.5–15.5)
WBC: 5.9 K/uL (ref 4.0–10.5)
nRBC: 1 % — ABNORMAL HIGH (ref 0.0–0.2)

## 2024-07-13 LAB — COMPREHENSIVE METABOLIC PANEL WITH GFR
ALT: 13 U/L (ref 0–44)
AST: 16 U/L (ref 15–41)
Albumin: 3.3 g/dL — ABNORMAL LOW (ref 3.5–5.0)
Alkaline Phosphatase: 62 U/L (ref 38–126)
Anion gap: 29 — ABNORMAL HIGH (ref 5–15)
BUN: 176 mg/dL — ABNORMAL HIGH (ref 8–23)
CO2: 11 mmol/L — ABNORMAL LOW (ref 22–32)
Calcium: 6.7 mg/dL — ABNORMAL LOW (ref 8.9–10.3)
Chloride: 98 mmol/L (ref 98–111)
Creatinine, Ser: 15.83 mg/dL — ABNORMAL HIGH (ref 0.44–1.00)
GFR, Estimated: 2 mL/min — ABNORMAL LOW (ref >60)
Glucose, Bld: 129 mg/dL — ABNORMAL HIGH (ref 70–99)
Potassium: 6.8 mmol/L (ref 3.5–5.1)
Sodium: 138 mmol/L (ref 135–145)
Total Bilirubin: 0.8 mg/dL (ref 0.0–1.2)
Total Protein: 6.5 g/dL (ref 6.5–8.1)

## 2024-07-13 LAB — POTASSIUM
Potassium: 6.6 mmol/L (ref 3.5–5.1)
Potassium: 4.3 mmol/L (ref 3.5–5.1)

## 2024-07-13 LAB — RAPID URINE DRUG SCREEN, HOSP PERFORMED
Amphetamines: NOT DETECTED
Barbiturates: NOT DETECTED
Benzodiazepines: NOT DETECTED
Cocaine: NOT DETECTED
Opiates: POSITIVE — AB
Tetrahydrocannabinol: NOT DETECTED

## 2024-07-13 LAB — GLUCOSE, CAPILLARY
Glucose-Capillary: 186 mg/dL — ABNORMAL HIGH (ref 70–99)
Glucose-Capillary: 197 mg/dL — ABNORMAL HIGH (ref 70–99)
Glucose-Capillary: 117 mg/dL — ABNORMAL HIGH (ref 70–99)
Glucose-Capillary: 153 mg/dL — ABNORMAL HIGH (ref 70–99)
Glucose-Capillary: 155 mg/dL — ABNORMAL HIGH (ref 70–99)
Glucose-Capillary: 138 mg/dL — ABNORMAL HIGH (ref 70–99)
Glucose-Capillary: 148 mg/dL — ABNORMAL HIGH (ref 70–99)
Glucose-Capillary: 154 mg/dL — ABNORMAL HIGH (ref 70–99)

## 2024-07-13 LAB — HEMOGLOBIN A1C
Hgb A1c MFr Bld: 5.8 % — ABNORMAL HIGH (ref 4.8–5.6)
Mean Plasma Glucose: 119.76 mg/dL

## 2024-07-13 LAB — PHOSPHORUS: Phosphorus: >30 mg/dL — ABNORMAL HIGH (ref 2.5–4.6)

## 2024-07-13 LAB — ETHANOL: Alcohol, Ethyl (B): <15 mg/dL (ref <15)

## 2024-07-13 LAB — TSH: TSH: 1.965 u[IU]/mL (ref 0.350–4.500)

## 2024-07-13 LAB — AMMONIA: Ammonia: 38 umol/L — ABNORMAL HIGH (ref 9–35)

## 2024-07-13 LAB — MAGNESIUM: Magnesium: 3 mg/dL — ABNORMAL HIGH (ref 1.7–2.4)

## 2024-07-13 LAB — MRSA NEXT GEN BY PCR, NASAL: MRSA by PCR Next Gen: DETECTED — AB

## 2024-07-13 MED ORDER — OLANZAPINE 10 MG IM SOLR
2.5000 mg | Freq: Once | INTRAMUSCULAR | Status: AC | PRN
  Administered 2024-07-13: 2.5 mg via INTRAMUSCULAR
  Filled 2024-07-13: qty 10

## 2024-07-13 MED ORDER — SODIUM CHLORIDE 0.9% FLUSH
10.0000 mL | Freq: Two times a day (BID) | INTRAVENOUS | Status: DC
  Administered 2024-07-13 – 2024-07-16 (×5): 10 mL

## 2024-07-13 MED ORDER — NOREPINEPHRINE 16 MG/250ML-% IV SOLN
0.0000 ug/min | INTRAVENOUS | Status: DC
  Administered 2024-07-13: 30 ug/min via INTRAVENOUS
  Filled 2024-07-13 (×2): qty 250

## 2024-07-13 MED ORDER — PRISMASOL BGK 4/2.5 32-4-2.5 MEQ/L EC SOLN: Status: DC

## 2024-07-13 MED ORDER — SODIUM CHLORIDE 0.9% FLUSH: 10.0000 mL | INTRAVENOUS | Status: DC | PRN

## 2024-07-13 MED ORDER — HEPARIN SODIUM (PORCINE) 1000 UNIT/ML DIALYSIS
1000.0000 [IU] | INTRAMUSCULAR | Status: DC | PRN
  Administered 2024-07-14: 3000 [IU] via INTRAVENOUS_CENTRAL
  Filled 2024-07-13: qty 3
  Filled 2024-07-13: qty 6

## 2024-07-13 MED ORDER — OSMOLITE 1.5 CAL PO LIQD
1000.0000 mL | ORAL | Status: DC
  Administered 2024-07-13 – 2024-07-14 (×2): 1000 mL
  Filled 2024-07-13: qty 1000

## 2024-07-13 MED ORDER — SODIUM CHLORIDE 0.9 % IV SOLN
250.0000 mL | INTRAVENOUS | Status: AC
  Administered 2024-07-13: 250 mL via INTRAVENOUS

## 2024-07-13 MED ORDER — INSULIN ASPART 100 UNIT/ML IJ SOLN
0.0000 [IU] | INTRAMUSCULAR | Status: DC
  Administered 2024-07-13 (×2): 2 [IU] via SUBCUTANEOUS
  Administered 2024-07-13 – 2024-07-14 (×2): 3 [IU] via SUBCUTANEOUS
  Administered 2024-07-14: 2 [IU] via SUBCUTANEOUS
  Administered 2024-07-14 – 2024-07-15 (×2): 3 [IU] via SUBCUTANEOUS
  Administered 2024-07-15 – 2024-07-16 (×4): 2 [IU] via SUBCUTANEOUS
  Filled 2024-07-13: qty 2
  Filled 2024-07-13: qty 3
  Filled 2024-07-13 (×5): qty 2
  Filled 2024-07-13 (×2): qty 3

## 2024-07-13 MED ORDER — LORAZEPAM 2 MG/ML IJ SOLN
1.0000 mg | Freq: Once | INTRAMUSCULAR | Status: AC
  Administered 2024-07-13: 1 mg via INTRAVENOUS
  Filled 2024-07-13: qty 1

## 2024-07-13 MED ORDER — MUPIROCIN 2 % EX OINT
1.0000 | TOPICAL_OINTMENT | Freq: Two times a day (BID) | CUTANEOUS | Status: AC
  Administered 2024-07-13 – 2024-07-17 (×10): 1 via NASAL
  Filled 2024-07-13 (×2): qty 22

## 2024-07-13 MED ORDER — RENA-VITE PO TABS
1.0000 | ORAL_TABLET | Freq: Every day | ORAL | Status: DC
  Administered 2024-07-13 – 2024-07-14 (×2): 1
  Filled 2024-07-13 (×2): qty 1

## 2024-07-13 MED ORDER — PROSOURCE TF20 ENFIT COMPATIBL EN LIQD
60.0000 mL | Freq: Every day | ENTERAL | Status: DC
  Administered 2024-07-13 – 2024-07-14 (×2): 60 mL
  Filled 2024-07-13 (×2): qty 60

## 2024-07-13 MED ORDER — CHLORHEXIDINE GLUCONATE CLOTH 2 % EX PADS: 6.0000 | MEDICATED_PAD | Freq: Every day | CUTANEOUS | Status: DC

## 2024-07-13 MED ORDER — FAMOTIDINE 20 MG PO TABS
10.0000 mg | ORAL_TABLET | Freq: Every day | ORAL | Status: DC
  Administered 2024-07-14: 10 mg
  Filled 2024-07-13: qty 1

## 2024-07-13 MED ORDER — ALTEPLASE 2 MG IJ SOLR
2.0000 mg | Freq: Once | INTRAMUSCULAR | Status: AC
  Administered 2024-07-13: 2 mg
  Filled 2024-07-13: qty 2

## 2024-07-13 MED ORDER — PHENYLEPHRINE HCL-NACL 20-0.9 MG/250ML-% IV SOLN: 25.0000 ug/min | INTRAVENOUS | Status: DC

## 2024-07-13 MED ORDER — CARMEX CLASSIC LIP BALM EX OINT
TOPICAL_OINTMENT | CUTANEOUS | Status: DC | PRN
  Administered 2024-07-13: 1 via TOPICAL
  Filled 2024-07-13: qty 10

## 2024-07-13 MED ORDER — STERILE WATER FOR INJECTION IJ SOLN
INTRAMUSCULAR | Status: AC
  Administered 2024-07-13: 10 mL
  Filled 2024-07-13: qty 10

## 2024-07-13 NOTE — Progress Notes (Signed)
 eLink Physician-Brief Progress Note Patient Name: Alison Whitehead DOB: 12-10-1950 MRN: [id number]   Date of Service  07/13/2024  HPI/Events of Note  Patient admitted with renal failure and serum K+ of 8.0, patient also has severe agitated delirium.  eICU Interventions  New Patient Evaluation. Posey restraints, Zyprexa  2.5 mg IM x 1, peripheral Phenylephrine  PRN hypotension on max dose peripheral Levophed .        [name] 07/13/2024, 12:13 AM

## 2024-07-13 NOTE — Procedures (Signed)
 Central Venous Catheter Insertion Procedure Note  Alison Whitehead  [id number]  [birth year]  Date:07/13/24  Time:1:24 AM   Provider Performing:Swayze Pries   Procedure: Insertion of Non-tunneled Central Venous [phone]) with US  guidance (23062)   Indication(s) Medication administration  Consent Risks of the procedure as well as the alternatives and risks of each were explained to the patient and/or caregiver.  Consent for the procedure was obtained and is signed in the bedside chart  Anesthesia Topical only with 1% lidocaine    Timeout Verified patient identification, verified procedure, site/side was marked, verified correct patient position, special equipment/implants available, medications/allergies/relevant history reviewed, required imaging and test results available.  Sterile Technique Maximal sterile technique including full sterile barrier drape, hand hygiene, sterile gown, sterile gloves, mask, hair covering, sterile ultrasound probe cover (if used).  Procedure Description Area of catheter insertion was cleaned with chlorhexidine  and draped in sterile fashion.  With real-time ultrasound guidance a central venous catheter was placed into the left femoral vein. Nonpulsatile blood flow and easy flushing noted in all ports.  The catheter was sutured in place and sterile dressing applied.  Complications/Tolerance None; patient tolerated the procedure well.  Chest x-ray is not ordered for femoral cannulation.  EBL Minimal  Specimen(s) None  POCUS Image of Left Femoral Vein guidewire   Tamela Stakes, MD  Attending Physician, Critical Care Medicine [facility] See Amion for pager If no response to pager, please call [phone] until 7pm After 7pm, Please call E-link [phone]

## 2024-07-13 NOTE — Progress Notes (Signed)
 Patient extremely agitated during cortrak placement, attempting to get out of bed and pull out lines and tubes. Precedex  had been turned down by half this morning for a wake up assessment but was turned back up to the previous rate during this episode. Notified CCM NP who verbalized this plan was okay, also gave one time dose of ativan  for extreme agitation.

## 2024-07-13 NOTE — Progress Notes (Signed)
 eLink Physician-Brief Progress Note Patient Name: Alison Whitehead DOB: 1951/01/30 MRN: [id number]   Date of Service  07/13/2024  HPI/Events of Note  Patient now appropriately sedated on Precedex .  eICU Interventions  Violent restraints orders discontinued and soft bilateral wrist restraints orders entered.        [name] 07/13/2024, 3:33 AM

## 2024-07-13 NOTE — TOC CM/SW Note (Addendum)
 Transition of Care [facility]) - Inpatient Brief Assessment   Patient Details  Name: Alison Whitehead MRN: [id number] Date of Birth: 09/24/1950  Transition of Care [facility]) CM/SW Contact:    Tom-Johnson, Harvest Muskrat, RN Phone Number: 07/13/2024, 1:20 PM   Clinical Narrative:  Patient presented to the ED with Altered Mental Status, Distended Abdomen. Patient was found at home confused after a wellness check by her daughter. Patient found to be Hypotensive and Hypothermic 47F. Labs showed K+ 7.5, Creatinine 16.15, admitted with AKI.  Patient continues on CRRT and Bicarb gtt, IV abx, Precedex , Levo. Creatinine today at 9.99.  Has hx of CKD 3b, HTN, HLD, Prediabetes. Nephrology following.     CM went to bedside to assess patient. Patient sleepy, not able to arouse at this time. CM will continue to follow and assess when appropriate As patient progresses with care towards discharge.       Transition of Care Asessment:

## 2024-07-13 NOTE — Progress Notes (Signed)
 [facility] Progress Note   Assessment/ Plan:   AKI on CKD3b -followed by Dr Dennise as an outpatient. Baseline Cr around 1.4-1.7, last set of labs with me as an OP was on 04/27/24 which showed a Cr of 1.36, eGFR 42, K 4.5 -suspecting etiology to be secondary to ATN secondary to shock -hold SGLT2I, ARB, triamterene -HCTZ - CRRT- improving, change to 4K pre and post and 2K dialysate - making urine so hopefully will be able to stop soon- not out of the woods yet    Hyperkalemia - resolving with CRRT   Anion Gap Metabolic acidosis/Acidemia -currently on bicarb gtt. Lactate WNL - ok to stop bicarb gtt- following up numbers, at 15 or greater usually fine when on CRRT   Shock -presumed to be septic - CT abd/ pelvis with possible foreign body in sigmoid colon--defer to primary team, may need GI input?   Toxic metabolic encephalopathy Uremic encephalopathy -CTH negative -CRRT as above   Hypothermia -abx/work up per primary service   Anemia -hgb 10.2, acceptable for CKD. Transfuse PRN for Hgb < 7   Hyponatremia -hypervolemic and secondary to severe AKI, lasix  & CRRT as abov  Subjective:    Seen in room.  Sedated now, doing better re: agitation.  K better, changed to 4K pre and post but still 2K dialysate.  Made about 500 mL urine.  Just came back from CT   Objective:   BP 104/62   Pulse 78   Temp 98.1 F (36.7 C)   Resp 11   Ht 5' 6 (1.676 m)   Wt 107.7 kg   SpO2 100%   BMI 38.32 kg/m   Intake/Output Summary (Last 24 hours) at 07/13/2024 1038 Last data filed at 07/13/2024 1000 Gross per 24 hour  Intake 2707.32 ml  Output 2710 ml  Net -2.68 ml   Weight change:   Physical Exam: Gen:NAD, sedated CVS: RRR Resp: clear, faint snoring Abd: soft, nontender to palpation Ext:no LE edema  Imaging: CT ABDOMEN PELVIS WO CONTRAST Result Date: 07/13/2024 EXAM: CT ABDOMEN AND PELVIS WITHOUT CONTRAST 07/13/2024 08:14:00 AM TECHNIQUE: CT of the abdomen and  pelvis was performed without the administration of intravenous contrast. Multiplanar reformatted images are provided for review. Automated exposure control, iterative reconstruction, and/or weight-based adjustment of the mA/kV was utilized to reduce the radiation dose to as low as reasonably achievable. COMPARISON: 09/30/2019 CLINICAL HISTORY: abd pain, ams FINDINGS: LOWER CHEST: Moderate cardiomegaly. Bilateral lower lobe airspace opacities primarily from atelectasis. LIVER: The liver is unremarkable. GALLBLADDER AND BILE DUCTS: Gallbladder is unremarkable. No biliary ductal dilatation. SPLEEN: No acute abnormality. PANCREAS: No acute abnormality. ADRENAL GLANDS: No acute abnormality. KIDNEYS, URETERS AND BLADDER: 1.5 x 0.7 x 1.3 cm hyperdense lesion of the left mid kidney posteromedially on image 34 series 3, internal density 101 Hounsfield units, compatible with benign positive category 2 cyst. No further imaging workup of this lesion is indicated. Foley catheter is present in the urinary bladder. No hydronephrosis or hydroureter. Bilateral periureteral vascular calcifications but no definite intraureteral stone seen. GI AND BOWEL: Stomach demonstrates no acute abnormality. Sigmoid colon diverticulosis and scattered diverticula elsewhere in the colon. Vertically oriented 3.4 cm linear density in the sigmoid colon on image 86 series 7, not present on prior exams, possibly intraluminal animal bone given the configuration. No associated obstruction or extraluminal gas although the orientation of the long axis of this structure is perpendicular to the axis of the sigmoid colon. There is no bowel obstruction. PERITONEUM AND RETROPERITONEUM:  No ascites. No free air. VASCULATURE: Systemic atherosclerosis is present, including the aorta and iliac arteries. Aorta is normal in caliber. Right venous femoral line terminates in the right external iliac vein. Left femoral venous line likewise terminates in the common femoral  vein. A smaller likely arterial left femoral vein extends distally into the profunda branch of the left femoral artery, with tip not visible. LYMPH NODES: No lymphadenopathy. REPRODUCTIVE ORGANS: Calcified uterine fibroids. BONES AND SOFT TISSUES: Supraumbilical hernia contains adipose tissue. 7 mm degenerative anterolisthesis at L4-L5. Lumbar spondylosis and degenerative disc disease cause moderate left foraminal impingement at L3-L4 and bilateral foraminal impingement at L4-L5. Moderate degenerative hip arthropathy bilaterally. No acute osseous abnormality. No focal soft tissue abnormality. IMPRESSION: 1. Vertically oriented 3.4 cm linear density in the sigmoid colon, possibly an ingested bone, without current obstruction or extraluminal gas. This structure is oriented perpendicular to the axis of the sigmoid colon. Recommend correlation with focal lower pelvis tenderness or GI bleeding ; if concern for retained sharp foreign body, consider surgical or endoscopic evaluation. 2. The left femoral arterial line appears to extend distally into the profunda femoris branch of the left femoral artery , tip not included. 3. Benign Bosniak II hemorrhagic/proteinaceous cyst in the left kidney; no follow-up imaging recommended. 4. Systemic atherosclerosis involving the aorta and iliac arteries. 5. Moderate cardiomegaly and bilateral lower lobe airspace opacities likely from atelectasis. 6. Degenerative changes including 7 mm degenerative anterolisthesis at L4-5, lumbar spondylosis and degenerative disc disease with moderate left foraminal impingement at L3-4 and bilateral foraminal impingement at L4-5, and moderate bilateral hip arthropathy. 7. Additional chronic/incidental findings including Foley catheter within the urinary bladder, right femoral venous line terminating in the right external iliac vein, left femoral venous line terminating in the common femoral vein, sigmoid diverticulosis and scattered colonic  diverticula, calcified uterine fibroids, and a supraumbilical fat-containing hernia. Electronically signed by: Ryan Salvage MD 07/13/2024 08:46 AM EST RP Workstation: [id number]   CT Head Wo Contrast Result Date: 07/13/2024 CLINICAL DATA:  Altered mental status. EXAM: CT HEAD WITHOUT CONTRAST TECHNIQUE: Contiguous axial images were obtained from the base of the skull through the vertex without intravenous contrast. RADIATION DOSE REDUCTION: This exam was performed according to the departmental dose-optimization program which includes automated exposure control, adjustment of the mA and/or kV according to patient size and/or use of iterative reconstruction technique. COMPARISON:  05/15/2020 FINDINGS: Brain: There is no evidence for acute hemorrhage, hydrocephalus, mass lesion, or abnormal extra-axial fluid collection. No definite CT evidence for acute infarction. Vascular: No hyperdense vessel or unexpected calcification. Skull: No evidence for fracture. No worrisome lytic or sclerotic lesion. Sinuses/Orbits: The visualized paranasal sinuses and mastoid air cells are clear. Old medial right orbital wall fracture again noted. Other: None. IMPRESSION: No acute intracranial abnormality. Electronically Signed   By: Camellia Candle M.D.   On: 07/13/2024 08:40   US  RENAL Result Date: 07/12/2024 CLINICAL DATA:  Acute kidney injury EXAM: RENAL / URINARY TRACT ULTRASOUND COMPLETE COMPARISON:  Ultrasound 11/22/2020 FINDINGS: Right Kidney: Renal measurements: 9.5 x 5.8 x 4.7 cm = volume: 134 mL. Echogenic cortex. No mass or hydronephrosis Left Kidney: Renal measurements: 8.4 x 4.9 x 4.5 cm = volume: 96.2 mL. Echogenic cortex. No mass or hydronephrosis. Bladder: Appears normal for degree of bladder distention. Other: None. IMPRESSION: Slightly small but symmetric kidneys with echogenic cortex consistent with medical renal disease and mild atrophy/scarring. No hydronephrosis Electronically Signed   By: Luke Bun  M.D.   On: 07/12/2024 23:42  DG Chest Port 1 View Result Date: 07/12/2024 CLINICAL DATA:  Questionable sepsis. EXAM: PORTABLE CHEST 1 VIEW COMPARISON:  Chest radiograph dated 05/20/2016. FINDINGS: Cardiomegaly with vascular congestion. No focal consolidation, pleural effusion, or pneumothorax. No acute osseous pathology. IMPRESSION: Cardiomegaly with vascular congestion. No focal consolidation. Electronically Signed   By: Vanetta Chou M.D.   On: 07/12/2024 19:57    Labs: BMET Recent Labs  Lab 07/12/24 1930 07/12/24 1942 07/12/24 1953 07/13/24 0050 07/13/24 0255 07/13/24 0711  NA 136 133* 131* 138  --   --   K >7.5* 7.9* 8.3* 6.8* 6.6* 4.3  CL 101 106  --  98  --   --   CO2 8*  --   --  11*  --   --   GLUCOSE 90 89  --  129*  --   --   BUN 187* >130*  --  176*  --   --   CREATININE 16.15* 18.00*  --  15.83*  --   --   CALCIUM  7.0*  --   --  6.7*  --   --   PHOS  --   --   --  >30.0*  --   --    CBC Recent Labs  Lab 07/12/24 1930 07/12/24 1942 07/12/24 1953 07/13/24 0050  WBC 4.9  --   --  5.9  NEUTROABS 3.9  --   --   --   HGB 9.3* 10.2* 10.2* 9.0*  HCT 30.8* 30.0* 30.0* 29.0*  MCV 90.1  --   --  87.1  PLT 235  --   --  221    Medications:     Chlorhexidine  Gluconate Cloth  6 each Topical Daily   heparin   5,000 Units Subcutaneous Q8H   mupirocin  ointment  1 Application Nasal BID    Almarie Bonine, MD 07/13/2024, 10:38 AM

## 2024-07-13 NOTE — Progress Notes (Signed)
 Initial Nutrition Assessment  DOCUMENTATION CODES:  Not applicable  INTERVENTION:  TF via Cortrak: Osmolite 1.5 at 45ml/hr (1080ml per day) *start at 15ml and advance by 10ml q8h to goal rate 60ml ProSource TF20 BID Provides 1780 kcal, 108g protein and free water  daily  Renal MVI with minerals daily  NUTRITION DIAGNOSIS:  Inadequate oral intake related to acute illness as evidenced by NPO status.  GOAL:  Patient will meet greater than or equal to 90% of their needs  MONITOR:  Labs, Weight trends, TF tolerance, I & O's  REASON FOR ASSESSMENT:  Consult Other (Comment) (CRRT protocol)  ASSESSMENT:  Pt admitted with confusion and uremia r/t progression of renal disease. PMH significant for CKD 3b, HTN, HLD, prediabetes  12/9: admitted to ICU, CRRT initiated 12/10: CT head negative; Cortrak placed  Seen by Nephrology.  Remains on CRRT. Making urine.   Patient sleeping at time of visit. Of note has been on sedation secondary to agitation following Cortrak placement. Unable to obtain detailed nutrition related history at this time.   Dry weight unknown.   No weight history on file within the last year to review and assess for significant fluctuations. However within the last 2 years, pt's weight seems to have remained between 100-109 kg.   UOP: x12 hours + x8 hours CRRT: x12 hours I/O's: +143ml since admit  Medications: IV abx Levo @ 3 mcg/min Vaso @ 0.04 units/min  Labs:  BUN 131 Cr 9.99 Anion gap 19 Corrected calcium  7.38 Phosphorus 9.7 Mg 3.0 Albumin 2.9 Ammonia 38 GFR 4 CBG's 117-197 x24 hours  NUTRITION - FOCUSED PHYSICAL EXAM: Flowsheet Row Most Recent Value  Orbital Region No depletion  Upper Arm Region No depletion  Thoracic and Lumbar Region No depletion  Buccal Region No depletion  Temple Region Mild depletion  Clavicle Bone Region No depletion  Clavicle and Acromion Bone Region No depletion  Scapular Bone Region  Unable to assess  Dorsal Hand Mild depletion  Patellar Region No depletion  Anterior Thigh Region Mild depletion  Posterior Calf Region Unable to assess  Edema (RD Assessment) None  Hair Reviewed  Eyes Unable to assess  Mouth Unable to assess  Skin Reviewed  Nails Reviewed    Diet Order:   Diet Order             Diet NPO time specified  Diet effective now                   EDUCATION NEEDS:  No education needs have been identified at this time  Skin:  Skin Assessment: Reviewed RN Assessment  Last BM:  PTA  Height:   Ht Readings from Last 1 Encounters:  07/12/24 5' 6 (1.676 m)    Weight:   Wt Readings from Last 1 Encounters:  07/13/24 107.7 kg    Ideal Body Weight:  59.1 kg  BMI:  Body mass index is 38.32 kg/m.  Estimated Nutritional Needs:   Kcal:  1600-1800  Protein:  100-115g  Fluid:  1L + UOP  Royce Maris, RDN, LDN Clinical Nutrition See AMiON for contact information.

## 2024-07-13 NOTE — Procedures (Signed)
 Cortrak  Person Inserting Tube:  Casia Corti, Olivia SAUNDERS, RD Tube Type:  Cortrak - 43 inches Tube Size:  10 Tube Location:  Left nare Secured by: Bridle Initial Placement:  Gastric Technique Used to Measure Tube Placement:  Marking at nare/corner of mouth Cortrak Secured At:  64 cm Initial Placement Verification:  Cortrak device (Registered Dieticians Only)  Cortrak Tube Team Note:  Consult received to place a Cortrak feeding tube.   No x-ray is required. RN may begin using tube.   If the tube becomes dislodged please keep the tube and contact the Cortrak team at [email or web address] for replacement.  If after hours and replacement cannot be delayed, place a NG tube and confirm placement with an abdominal x-ray.    Olivia Kenning, RD Registered Dietitian  See Amion for more information
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
## 2024-07-13 NOTE — Progress Notes (Signed)
 Brief note Most recent K down to 6.8. discussed with RN, started CRRT ~145am. Continue with 0k pre and post, 2k dialysate. Can't take PO given that she is sedated. Plan for repeat K at 3am.  Ephriam Stank, MD [facility]

## 2024-07-13 NOTE — Procedures (Signed)
 Arterial Catheter Insertion Procedure Note  Alison Whitehead  [id number]  05/18/1951  Date:07/13/24  Time:1:26 AM    Provider Performing: Tamela Stakes    Procedure: Insertion of Arterial Line (63379) with US  guidance (23062)   Indication(s) Blood pressure monitoring and/or need for frequent ABGs  Consent Risks of the procedure as well as the alternatives and risks of each were explained to the patient and/or caregiver.  Consent for the procedure was obtained and is signed in the bedside chart  Anesthesia None   Time Out Verified patient identification, verified procedure, site/side was marked, verified correct patient position, special equipment/implants available, medications/allergies/relevant history reviewed, required imaging and test results available.   Sterile Technique Maximal sterile technique including full sterile barrier drape, hand hygiene, sterile gown, sterile gloves, mask, hair covering, sterile ultrasound probe cover (if used).   Procedure Description Area of catheter insertion was cleaned with chlorhexidine  and draped in sterile fashion. With real-time ultrasound guidance an arterial catheter was placed into the left femoral artery.  Appropriate arterial tracings confirmed on monitor.     Complications/Tolerance None; patient tolerated the procedure well.   EBL Minimal   Specimen(s) None  Tamela Stakes, MD  Attending Physician, Critical Care Medicine [facility] See Amion for pager If no response to pager, please call [phone] until 7pm After 7pm, Please call E-link [phone]

## 2024-07-13 NOTE — Plan of Care (Signed)
°  Problem: Safety: Goal: Non-violent Restraint(s) Outcome: Progressing   Problem: Clinical Measurements: Goal: Ability to maintain clinical measurements within normal limits will improve Outcome: Progressing Goal: Will remain free from infection Outcome: Progressing Goal: Diagnostic test results will improve Outcome: Progressing Goal: Respiratory complications will improve Outcome: Progressing Goal: Cardiovascular complication will be avoided Outcome: Progressing   Problem: Activity: Goal: Risk for activity intolerance will decrease Outcome: Progressing   Problem: Nutrition: Goal: Adequate nutrition will be maintained Outcome: Progressing   Problem: Coping: Goal: Level of anxiety will decrease Outcome: Progressing   Problem: Elimination: Goal: Will not experience complications related to bowel motility Outcome: Progressing Goal: Will not experience complications related to urinary retention Outcome: Progressing   Problem: Pain Managment: Goal: General experience of comfort will improve and/or be controlled Outcome: Progressing   Problem: Safety: Goal: Ability to remain free from injury will improve Outcome: Progressing   Problem: Skin Integrity: Goal: Risk for impaired skin integrity will decrease Outcome: Progressing   Problem: Clinical Measurements: Goal: Complications related to the disease process or treatment will be avoided or minimized Outcome: Progressing     Problem: Safety: Goal: Violent Restraint(s) Outcome: Completed/Met

## 2024-07-13 NOTE — Procedures (Signed)
 Central Venous Catheter Insertion Procedure Note  Oluwademilade Kellett  [id number]  [birth year]  Date:07/13/24  Time:1:22 AM   Provider Performing:Mahitha Hickling   Procedure: Insertion of Non-tunneled Central Venous Catheter(36556)with US  guidance (23062)    Indication(s) Hemodialysis  Consent Risks of the procedure as well as the alternatives and risks of each were explained to the patient and/or caregiver.  Consent for the procedure was obtained and is signed in the bedside chart  Anesthesia Topical only with 1% lidocaine    Timeout Verified patient identification, verified procedure, site/side was marked, verified correct patient position, special equipment/implants available, medications/allergies/relevant history reviewed, required imaging and test results available.  Sterile Technique Maximal sterile technique including full sterile barrier drape, hand hygiene, sterile gown, sterile gloves, mask, hair covering, sterile ultrasound probe cover (if used).  Procedure Description Area of catheter insertion was cleaned with chlorhexidine  and draped in sterile fashion.   With real-time ultrasound guidance a HD catheter was placed into the right femoral vein.  Nonpulsatile blood flow and easy flushing noted in all ports.  The catheter was sutured in place and sterile dressing applied.  Complications/Tolerance None; patient tolerated the procedure well. Chest x-ray is not ordered for femoral cannulation.  EBL Minimal  Specimen(s) None  POCUS IMAGE OF Right Femoral Vein guidewire      Tamela Stakes, MD  Attending Physician, Critical Care Medicine [facility] See Amion for pager If no response to pager, please call [phone] until 7pm After 7pm, Please call E-link [phone]

## 2024-07-13 NOTE — Progress Notes (Signed)
 NAME:  Alison Whitehead, MRN:  [id number], DOB:  27-Jul-1951, LOS: 1 ADMISSION DATE:  07/12/2024, CONSULTATION DATE:  12/9 REFERRING MD:  Dr. Gennaro , CHIEF COMPLAINT:  AKI; hypotension   History of Present Illness:  Patient is a 73 yo F w/ pertinent PMH CKD 3b, HTN, HLD, prediabetes presents to [facility] on 12/9 with AMS.  On 12/9 patient's daughter went to check on patient found confused sitting on edge of bed.  Daughter says patient was under the weather and groggy yesterday but was AO.  EMS transferred to [facility] ED.  Patient confused but AO x 2 initially.  Complaining of abdominal pain.  Abdomen distended with tenderness on palpation.  Also noted to be hypotensive and hypothermic 90 F.  Cultures obtained and given IV fluids.  WBC 4.9, LA WNL, and CXR unremarkable.  UA with small leukocytes.  Patient made hypotensive Levophed .  Patient noted to be hyperkalemic greater than 7.5.  CO2 8, creat 16.15, BUN 187, calcium  7, AG 27.  VBG 7.11, 25, 172, 8.4.  Patient given temporizing medications and nephro consulted.  Recommended CRRT and bicarb drip.  PCCM consulted for ICU admission.  Pertinent  Medical History   Past Medical History:  Diagnosis Date   Allergy    seasonal   Arthritis    CKD (chronic kidney disease)    DDD (degenerative disc disease)    Degenerative disc disease, lumbar    Hypertension    IBS (irritable bowel syndrome)    Osteopenia    Osteoporosis    osteopenia   Stroke (HCC) 09/2019     Significant Hospital Events: Including procedures, antibiotic start and stop dates in addition to other pertinent events   12/9 admit to ICU started on pressors and will place HD catheter and start CRRT 12/10 Started on CRRT overnight   Interim History / Subjective:  Sleepy on Precedex     Objective    Blood pressure 104/62, pulse 81, temperature 97.9 F (36.6 C), resp. rate 12, height 5' 6 (1.676 m), weight 107.7 kg, SpO2 99%.        Intake/Output Summary (Last 24 hours) at  07/13/2024 1008 Last data filed at 07/13/2024 1000 Gross per 24 hour  Intake 2707.32 ml  Output 2710 ml  Net -2.68 ml   Filed Weights   07/12/24 2301 07/13/24 0500  Weight: 104.3 kg 107.7 kg    Examination: General: Acte on chronically ill appearing elderly female lying in bed, in NAD HEENT: [place]/AT, MM pink/moist, PERRL,  Neuro: Sleepy on Precedex  but arousable to stimuli  CV: s1s2 regular rate and rhythm, no murmur, rubs, or gallops,  PULM: Slight snoring respirations, no increased work of breathing GI: soft, bowel sounds active in all 4 quadrants, non-tender, non-distended Extremities: warm/dry, no edema  Skin: no rashes or lesions  Resolved problem list   Assessment and Plan   AKI on CKD 3B -Creatinine 16.15 with GFR 2 on admission compare to creatinine 1.73 with GFR 02 Jan 2023 Hyperkalemia Hypocalcemia AGMA P: Nephrology consulted, appreciate assistance Continue CRRT per nephrology Continue bicarb drip Optimize electrolytes  Shock -Presumed sepsis although source unclear; likely some abd source given abd pain; small leukocytes and UA -No infectious sources seen on CT abdomen Hypothermia P: Follow cultures Continue pressors for MAP goal greater than 65 Bicarb drip as above Continue empiric Cefepime  flagyl    Acute encephalopathy: -Likely uremia related; sepsis? -Head CT negative P: Maintain neuro protective measures; goal for eurothermia, euglycemia, eunatermia, normoxia, and PCO2 goal of 35-40  Nutrition and bowel regimen AEDs per neurology  Aspirations precautions  Management of electrolyte abnormalities as above  HTN/HLD P: Hold home medications given shock state  Prediabetes P: Continue to trend CBGs  Anemia of chronic disease P: Trend CBC Transfuse per protocol Hemoglobin goal greater than 7  DDD P: Home muscle muscle relaxant on hold  Critical care time:  CRITICAL CARE Performed by: Cantrell Martus D. Harris   Total critical care time: 38  minutes  Critical care time was exclusive of separately billable procedures and treating other patients.  Critical care was necessary to treat or prevent imminent or life-threatening deterioration.  Critical care was time spent personally by me on the following activities: development of treatment plan with patient and/or surrogate as well as nursing, discussions with consultants, evaluation of patient's response to treatment, examination of patient, obtaining history from patient or surrogate, ordering and performing treatments and interventions, ordering and review of laboratory studies, ordering and review of radiographic studies, pulse oximetry and re-evaluation of patient's condition.  Amon Costilla D. Harris, NP-C [facility] Personal contact information can be found on Amion  If no contact or response made please call [phone] 07/13/2024, 10:17 AM

## 2024-07-14 DIAGNOSIS — N179 Acute kidney failure, unspecified: Secondary | ICD-10-CM | POA: Diagnosis not present

## 2024-07-14 DIAGNOSIS — E875 Hyperkalemia: Secondary | ICD-10-CM | POA: Diagnosis not present

## 2024-07-14 DIAGNOSIS — N1832 Chronic kidney disease, stage 3b: Secondary | ICD-10-CM | POA: Diagnosis not present

## 2024-07-14 LAB — RENAL FUNCTION PANEL
Albumin: 2.8 g/dL — ABNORMAL LOW (ref 3.5–5.0)
Anion gap: 8 (ref 5–15)
BUN: 54 mg/dL — ABNORMAL HIGH (ref 8–23)
CO2: 27 mmol/L (ref 22–32)
Calcium: 7.1 mg/dL — ABNORMAL LOW (ref 8.9–10.3)
Chloride: 102 mmol/L (ref 98–111)
Creatinine, Ser: 3.4 mg/dL — ABNORMAL HIGH (ref 0.44–1.00)
GFR, Estimated: 14 mL/min — ABNORMAL LOW (ref >60)
Glucose, Bld: 137 mg/dL — ABNORMAL HIGH (ref 70–99)
Phosphorus: 3.2 mg/dL (ref 2.5–4.6)
Potassium: 3.9 mmol/L (ref 3.5–5.1)
Sodium: 137 mmol/L (ref 135–145)

## 2024-07-14 LAB — GLUCOSE, CAPILLARY
Glucose-Capillary: 115 mg/dL — ABNORMAL HIGH (ref 70–99)
Glucose-Capillary: 153 mg/dL — ABNORMAL HIGH (ref 70–99)
Glucose-Capillary: 171 mg/dL — ABNORMAL HIGH (ref 70–99)
Glucose-Capillary: 114 mg/dL — ABNORMAL HIGH (ref 70–99)
Glucose-Capillary: 126 mg/dL — ABNORMAL HIGH (ref 70–99)
Glucose-Capillary: 169 mg/dL — ABNORMAL HIGH (ref 70–99)

## 2024-07-14 LAB — MAGNESIUM: Magnesium: 2.2 mg/dL (ref 1.7–2.4)

## 2024-07-14 LAB — URINE CULTURE: Culture: NO GROWTH

## 2024-07-14 MED ORDER — OLANZAPINE 10 MG IM SOLR
2.5000 mg | Freq: Once | INTRAMUSCULAR | Status: AC | PRN
  Administered 2024-07-14: 2.5 mg via INTRAMUSCULAR
  Filled 2024-07-14: qty 10

## 2024-07-14 MED ORDER — ACETAMINOPHEN 160 MG/5ML PO SOLN
650.0000 mg | ORAL | Status: DC | PRN
  Administered 2024-07-15: 650 mg
  Filled 2024-07-14: qty 20.3

## 2024-07-14 MED ORDER — ALBUMIN HUMAN 25 % IV SOLN
25.0000 g | Freq: Four times a day (QID) | INTRAVENOUS | Status: AC
  Administered 2024-07-14 – 2024-07-15 (×3): 25 g via INTRAVENOUS
  Filled 2024-07-14 (×4): qty 100

## 2024-07-14 MED ORDER — ACETAMINOPHEN 10 MG/ML IV SOLN
1000.0000 mg | Freq: Once | INTRAVENOUS | Status: AC
  Administered 2024-07-14: 1000 mg via INTRAVENOUS
  Filled 2024-07-14: qty 100

## 2024-07-14 MED ORDER — STERILE WATER FOR INJECTION IJ SOLN
INTRAMUSCULAR | Status: AC
  Administered 2024-07-14: 2.1 mL
  Filled 2024-07-14: qty 10

## 2024-07-14 MED ORDER — ASPIRIN 81 MG PO CHEW
81.0000 mg | CHEWABLE_TABLET | Freq: Every day | ORAL | Status: DC
  Administered 2024-07-14: 81 mg
  Filled 2024-07-14: qty 1

## 2024-07-14 MED ORDER — OLANZAPINE 5 MG PO TBDP: 2.5000 mg | ORAL_TABLET | Freq: Once | ORAL | Status: DC

## 2024-07-14 MED ORDER — OLANZAPINE 10 MG IM SOLR
2.5000 mg | Freq: Once | INTRAMUSCULAR | Status: AC
  Administered 2024-07-14: 2.5 mg via INTRAMUSCULAR

## 2024-07-14 MED ADMIN — Haloperidol Lactate Inj 5 MG/ML: 2 mg | INTRAVENOUS | NDC 67457042600

## 2024-07-14 MED FILL — Haloperidol Lactate Inj 5 MG/ML: 1.0000 mg | INTRAMUSCULAR | Qty: 1 | Status: AC

## 2024-07-14 NOTE — Progress Notes (Signed)
 eLink Physician-Brief Progress Note Patient Name: Alison Whitehead DOB: 02-09-1951 MRN: [id number]   Date of Service  07/14/2024  HPI/Events of Note  Agitated delirium not resolved by IM Zyprexa  + Precedex  gtt.  eICU Interventions  PRN iv Haldol ordered.        Satchel Heidinger U Vasiliy Mccarry 07/14/2024, 3:34 AM

## 2024-07-14 NOTE — Progress Notes (Signed)
 [facility] Progress Note   Assessment/ Plan:   AKI on CKD3b -followed by Dr Dennise as an outpatient. Baseline Cr around 1.4-1.7, last set of labs with me as an OP was on 04/27/24 which showed a Cr of 1.36, eGFR 42, K 4.5 -suspecting etiology to be secondary to ATN secondary to shock -hold SGLT2I, ARB, triamterene -HCTZ - CRRT- improving, change to 4K pre and post and 2K dialysate - hold CRRT today- looks like she's recovering.    Hyperkalemia - resolving with CRRT   Anion Gap Metabolic acidosis/Acidemia -currently on bicarb gtt. Lactate WNL - ok to stop bicarb gtt- following up numbers, at 15 or greater usually fine when on CRRT   Shock -presumed to be septic - CT abd/ pelvis with possible foreign body in sigmoid colon--defer to primary team, may need GI input?   Toxic metabolic encephalopathy Uremic encephalopathy -CTH negative -CRRT as above   Hypothermia -abx/work up per primary service   Anemia -hgb 10.2, acceptable for CKD. Transfuse PRN for Hgb < 7   Hyponatremia -hypervolemic and secondary to severe AKI, lasix  & CRRT as abov  Subjective:   Making quite a bit of urine.  Only on vaso now, labs much improved.     Objective:   BP (!) 87/57 (BP Location: Right Arm)   Pulse 82   Temp (!) 97.5 F (36.4 C)   Resp 17   Ht 5' 6 (1.676 m)   Wt 103.1 kg   SpO2 93%   BMI 36.69 kg/m   Intake/Output Summary (Last 24 hours) at 07/14/2024 1001 Last data filed at 07/14/2024 0900 Gross per 24 hour  Intake 1860.48 ml  Output 3378 ml  Net -1517.52 ml   Weight change: -0.527 kg  Physical Exam: Gen:NAD, sedated CVS: RRR Resp: clear, faint snoring Abd: soft, nontender to palpation Ext:no LE edema  Imaging: CT ABDOMEN PELVIS WO CONTRAST Result Date: 07/13/2024 EXAM: CT ABDOMEN AND PELVIS WITHOUT CONTRAST 07/13/2024 08:14:00 AM TECHNIQUE: CT of the abdomen and pelvis was performed without the administration of intravenous contrast. Multiplanar  reformatted images are provided for review. Automated exposure control, iterative reconstruction, and/or weight-based adjustment of the mA/kV was utilized to reduce the radiation dose to as low as reasonably achievable. COMPARISON: 09/30/2019 CLINICAL HISTORY: abd pain, ams FINDINGS: LOWER CHEST: Moderate cardiomegaly. Bilateral lower lobe airspace opacities primarily from atelectasis. LIVER: The liver is unremarkable. GALLBLADDER AND BILE DUCTS: Gallbladder is unremarkable. No biliary ductal dilatation. SPLEEN: No acute abnormality. PANCREAS: No acute abnormality. ADRENAL GLANDS: No acute abnormality. KIDNEYS, URETERS AND BLADDER: 1.5 x 0.7 x 1.3 cm hyperdense lesion of the left mid kidney posteromedially on image 34 series 3, internal density 101 Hounsfield units, compatible with benign positive category 2 cyst. No further imaging workup of this lesion is indicated. Foley catheter is present in the urinary bladder. No hydronephrosis or hydroureter. Bilateral periureteral vascular calcifications but no definite intraureteral stone seen. GI AND BOWEL: Stomach demonstrates no acute abnormality. Sigmoid colon diverticulosis and scattered diverticula elsewhere in the colon. Vertically oriented 3.4 cm linear density in the sigmoid colon on image 86 series 7, not present on prior exams, possibly intraluminal animal bone given the configuration. No associated obstruction or extraluminal gas although the orientation of the long axis of this structure is perpendicular to the axis of the sigmoid colon. There is no bowel obstruction. PERITONEUM AND RETROPERITONEUM: No ascites. No free air. VASCULATURE: Systemic atherosclerosis is present, including the aorta and iliac arteries. Aorta is normal in caliber. Right  venous femoral line terminates in the right external iliac vein. Left femoral venous line likewise terminates in the common femoral vein. A smaller likely arterial left femoral vein extends distally into the profunda  branch of the left femoral artery, with tip not visible. LYMPH NODES: No lymphadenopathy. REPRODUCTIVE ORGANS: Calcified uterine fibroids. BONES AND SOFT TISSUES: Supraumbilical hernia contains adipose tissue. 7 mm degenerative anterolisthesis at L4-L5. Lumbar spondylosis and degenerative disc disease cause moderate left foraminal impingement at L3-L4 and bilateral foraminal impingement at L4-L5. Moderate degenerative hip arthropathy bilaterally. No acute osseous abnormality. No focal soft tissue abnormality. IMPRESSION: 1. Vertically oriented 3.4 cm linear density in the sigmoid colon, possibly an ingested bone, without current obstruction or extraluminal gas. This structure is oriented perpendicular to the axis of the sigmoid colon. Recommend correlation with focal lower pelvis tenderness or GI bleeding ; if concern for retained sharp foreign body, consider surgical or endoscopic evaluation. 2. The left femoral arterial line appears to extend distally into the profunda femoris branch of the left femoral artery , tip not included. 3. Benign Bosniak II hemorrhagic/proteinaceous cyst in the left kidney; no follow-up imaging recommended. 4. Systemic atherosclerosis involving the aorta and iliac arteries. 5. Moderate cardiomegaly and bilateral lower lobe airspace opacities likely from atelectasis. 6. Degenerative changes including 7 mm degenerative anterolisthesis at L4-5, lumbar spondylosis and degenerative disc disease with moderate left foraminal impingement at L3-4 and bilateral foraminal impingement at L4-5, and moderate bilateral hip arthropathy. 7. Additional chronic/incidental findings including Foley catheter within the urinary bladder, right femoral venous line terminating in the right external iliac vein, left femoral venous line terminating in the common femoral vein, sigmoid diverticulosis and scattered colonic diverticula, calcified uterine fibroids, and a supraumbilical fat-containing hernia.  Electronically signed by: Ryan Salvage MD 07/13/2024 08:46 AM EST RP Workstation: [id number]   CT Head Wo Contrast Result Date: 07/13/2024 CLINICAL DATA:  Altered mental status. EXAM: CT HEAD WITHOUT CONTRAST TECHNIQUE: Contiguous axial images were obtained from the base of the skull through the vertex without intravenous contrast. RADIATION DOSE REDUCTION: This exam was performed according to the departmental dose-optimization program which includes automated exposure control, adjustment of the mA and/or kV according to patient size and/or use of iterative reconstruction technique. COMPARISON:  05/15/2020 FINDINGS: Brain: There is no evidence for acute hemorrhage, hydrocephalus, mass lesion, or abnormal extra-axial fluid collection. No definite CT evidence for acute infarction. Vascular: No hyperdense vessel or unexpected calcification. Skull: No evidence for fracture. No worrisome lytic or sclerotic lesion. Sinuses/Orbits: The visualized paranasal sinuses and mastoid air cells are clear. Old medial right orbital wall fracture again noted. Other: None. IMPRESSION: No acute intracranial abnormality. Electronically Signed   By: Camellia Candle M.D.   On: 07/13/2024 08:40   US  RENAL Result Date: 07/12/2024 CLINICAL DATA:  Acute kidney injury EXAM: RENAL / URINARY TRACT ULTRASOUND COMPLETE COMPARISON:  Ultrasound 11/22/2020 FINDINGS: Right Kidney: Renal measurements: 9.5 x 5.8 x 4.7 cm = volume: 134 mL. Echogenic cortex. No mass or hydronephrosis Left Kidney: Renal measurements: 8.4 x 4.9 x 4.5 cm = volume: 96.2 mL. Echogenic cortex. No mass or hydronephrosis. Bladder: Appears normal for degree of bladder distention. Other: None. IMPRESSION: Slightly small but symmetric kidneys with echogenic cortex consistent with medical renal disease and mild atrophy/scarring. No hydronephrosis Electronically Signed   By: Luke Bun M.D.   On: 07/12/2024 23:42   DG Chest Port 1 View Result Date: 07/12/2024 CLINICAL  DATA:  Questionable sepsis. EXAM: PORTABLE CHEST 1 VIEW COMPARISON:  Chest  radiograph dated 05/20/2016. FINDINGS: Cardiomegaly with vascular congestion. No focal consolidation, pleural effusion, or pneumothorax. No acute osseous pathology. IMPRESSION: Cardiomegaly with vascular congestion. No focal consolidation. Electronically Signed   By: Vanetta Chou M.D.   On: 07/12/2024 19:57    Labs: BMET Recent Labs  Lab 07/12/24 1930 07/12/24 1942 07/12/24 1953 07/13/24 0050 07/13/24 0255 07/13/24 0711 07/13/24 0945 07/13/24 1626 07/14/24 0508  NA 136 133* 131* 138  --   --  136 138 137  K >7.5* 7.9* 8.3* 6.8* 6.6* 4.3 3.8 4.4 3.9  CL 101 106  --  98  --   --  98 102 102  CO2 8*  --   --  11*  --   --  19* 23 27  GLUCOSE 90 89  --  129*  --   --  190* 145* 137*  BUN 187* >130*  --  176*  --   --  131* 92* 54*  CREATININE 16.15* 18.00*  --  15.83*  --   --  9.99* 6.43* 3.40*  CALCIUM  7.0*  --   --  6.7*  --   --  6.5* 6.9* 7.1*  PHOS  --   --   --  >30.0*  --   --  9.7* 6.5* 3.2   CBC Recent Labs  Lab 07/12/24 1930 07/12/24 1942 07/12/24 1953 07/13/24 0050  WBC 4.9  --   --  5.9  NEUTROABS 3.9  --   --   --   HGB 9.3* 10.2* 10.2* 9.0*  HCT 30.8* 30.0* 30.0* 29.0*  MCV 90.1  --   --  87.1  PLT 235  --   --  221    Medications:     Chlorhexidine  Gluconate Cloth  6 each Topical Daily   famotidine   10 mg Per Tube Daily   feeding supplement (PROSource TF20)  60 mL Per Tube Daily   heparin   5,000 Units Subcutaneous Q8H   insulin  aspart  0-15 Units Subcutaneous Q4H   multivitamin  1 tablet Per Tube QHS   mupirocin  ointment  1 Application Nasal BID   sodium chloride  flush  10-40 mL Intracatheter Q12H    Almarie Bonine, MD 07/14/2024, 10:01 AM

## 2024-07-14 NOTE — Progress Notes (Signed)
 NAME:  Alison Whitehead, MRN:  [id number], DOB:  1951-04-08, LOS: 2 ADMISSION DATE:  07/12/2024, CONSULTATION DATE:  12/9 REFERRING MD:  Dr. Gennaro , CHIEF COMPLAINT:  AKI; hypotension   History of Present Illness:  Patient is a 73 yo F w/ pertinent PMH CKD 3b, HTN, HLD, prediabetes presents to [facility] on 12/9 with AMS.  On 12/9 patient's daughter went to check on patient found confused sitting on edge of bed.  Daughter says patient was under the weather and groggy yesterday but was AO.  EMS transferred to [facility] ED.  Patient confused but AO x 2 initially.  Complaining of abdominal pain.  Abdomen distended with tenderness on palpation.  Also noted to be hypotensive and hypothermic 90 F.  Cultures obtained and given IV fluids.  WBC 4.9, LA WNL, and CXR unremarkable.  UA with small leukocytes.  Patient made hypotensive Levophed .  Patient noted to be hyperkalemic greater than 7.5.  CO2 8, creat 16.15, BUN 187, calcium  7, AG 27.  VBG 7.11, 25, 172, 8.4.  Patient given temporizing medications and nephro consulted.  Recommended CRRT and bicarb drip.  PCCM consulted for ICU admission.  Pertinent  Medical History   Past Medical History:  Diagnosis Date   Allergy    seasonal   Arthritis    CKD (chronic kidney disease)    DDD (degenerative disc disease)    Degenerative disc disease, lumbar    Hypertension    IBS (irritable bowel syndrome)    Osteopenia    Osteoporosis    osteopenia   Stroke (HCC) 09/2019     Significant Hospital Events: Including procedures, antibiotic start and stop dates in addition to other pertinent events   12/9 admit to ICU started on pressors and will place HD catheter and start CRRT 12/10 Started on CRRT overnight  12/11 renal function drastically improved this a.m., nephrology planning to stop CRRT  Interim History / Subjective:  Sleeping on Precedex   Objective    Blood pressure (!) 87/57, pulse 72, temperature (!) 97 F (36.1 C), resp. rate 13, height 5'  6 (1.676 m), weight 103.1 kg, SpO2 97%.        Intake/Output Summary (Last 24 hours) at 07/14/2024 1014 Last data filed at 07/14/2024 1000 Gross per 24 hour  Intake 1927.03 ml  Output 3378 ml  Net -1450.97 ml   Filed Weights   07/13/24 0500 07/13/24 2100 07/14/24 0432  Weight: 107.7 kg 103.8 kg 103.1 kg    Examination: General: Acute on chronic ill-appearing deconditioned elderly female lying in bed on Precedex  drip in no acute distress HEENT: [place]/AT, MM pink/moist, PERRL,  Neuro: Sleepy on Precedex  CV: s1s2 regular rate and rhythm, no murmur, rubs, or gallops,  PULM: Clear to auscultation bilaterally, no increased work of breathing, bilateral breath GI: soft, bowel sounds active in all 4 quadrants, non-tender, non-distended, tolerating TF Extremities: warm/dry, no edema  Skin: no rashes or lesions  Resolved problem list   Assessment and Plan   AKI on CKD 3B -Creatinine 16.15 with GFR 2 on admission compare to creatinine 1.73 with GFR 02 Jan 2023 Hyperkalemia Hypocalcemia AGMA P: Nephrology following, appreciate assistance CRRT to be held today to assess for renal recovery Trend renal function Optimize electrolytes  Shock -Presumed sepsis although source unclear; likely some abd source given abd pain; small leukocytes and UA -No infectious sources seen on CT abdomen Hypothermia P: Cultures remain negative to date Follow hemodynamics off CRRT hopefully vasopressors can be discontinued Trend CBC and fever  curve Remains on empiric cefepime  and Flagyl  if able to get vasopressors off by this afternoon can likely stop antibiotics and monitor  Acute encephalopathy: -Likely uremia related; sepsis? -Head CT negative P: Remains on Precedex  this a.m., try to wean to assess underlying mentation Maintain neuroprotective measures Nutrition and bowel regiment Aspiration precautions  HTN/HLD P: Home medications remain on hold resume when  appropriate  Prediabetes P: SSI CBG goal 140-180 CBG checks every 4  Anemia of chronic disease P: Trend CBC Transfuse per protocol Hemoglobin goal greater than 7  DDD P: Home medications remain on hold  Critical care time:  CRITICAL CARE Performed by: Morgen Ritacco D. Harris   Total critical care time: 37 minutes  Critical care time was exclusive of separately billable procedures and treating other patients.  Critical care was necessary to treat or prevent imminent or life-threatening deterioration.  Critical care was time spent personally by me on the following activities: development of treatment plan with patient and/or surrogate as well as nursing, discussions with consultants, evaluation of patient's response to treatment, examination of patient, obtaining history from patient or surrogate, ordering and performing treatments and interventions, ordering and review of laboratory studies, ordering and review of radiographic studies, pulse oximetry and re-evaluation of patient's condition.  Latice Waitman D. Harris, NP-C [facility] Personal contact information can be found on Amion  If no contact or response made please call [phone] 07/14/2024, 10:14 AM

## 2024-07-14 NOTE — Progress Notes (Signed)
 eLink Physician-Brief Progress Note Patient Name: Alison Whitehead DOB: 1951/07/03 MRN: [id number]   Date of Service  07/14/2024  HPI/Events of Note  Patient with agitated delirium which may be exacerbated by back pain. She is trying to get out of bed, increasing her fall risk.  eICU Interventions  Precedex  gtt rate increased to 1.2, Ofirmev  1 gm iv x 1, Posey & soft bilateral soft wrist restraints.        Aydian Dimmick U Makana Feigel 07/14/2024, 12:26 AM

## 2024-07-14 NOTE — Progress Notes (Signed)
 eLink Physician-Brief Progress Note Patient Name: Alison Whitehead DOB: 05-27-1951 MRN: [id number]   Date of Service  07/14/2024  HPI/Events of Note  Patient still intermittently very agitated / assaultive per bedside RN despite Precedex  at 1.6 mcg.  eICU Interventions  Restraints order renewed.        Sondra Blixt U Kelle Ruppert 07/14/2024, 10:07 PM

## 2024-07-14 NOTE — Plan of Care (Signed)
°  Problem: Nutrition: Goal: Adequate nutrition will be maintained Outcome: Progressing   Problem: Elimination: Goal: Will not experience complications related to bowel motility Outcome: Progressing   Problem: Coping: Goal: Ability to adjust to condition or change in health will improve Outcome: Progressing   Problem: Fluid Volume: Goal: Ability to maintain a balanced intake and output will improve Outcome: Progressing   Problem: Health Behavior/Discharge Planning: Goal: Ability to identify and utilize available resources and services will improve Outcome: Progressing   Problem: Metabolic: Goal: Ability to maintain appropriate glucose levels will improve Outcome: Progressing   Problem: Nutritional: Goal: Maintenance of adequate nutrition will improve Outcome: Progressing Goal: Progress toward achieving an optimal weight will improve Outcome: Progressing   Problem: Skin Integrity: Goal: Risk for impaired skin integrity will decrease Outcome: Progressing   Problem: Tissue Perfusion: Goal: Adequacy of tissue perfusion will improve Outcome: Progressing   Problem: Safety: Goal: Non-violent Restraint(s) Outcome: Not Progressing   Problem: Education: Goal: Knowledge of General Education information will improve Description: Including pain rating scale, medication(s)/side effects and non-pharmacologic comfort measures Outcome: Not Progressing   Problem: Health Behavior/Discharge Planning: Goal: Ability to manage health-related needs will improve Outcome: Not Progressing   Problem: Clinical Measurements: Goal: Ability to maintain clinical measurements within normal limits will improve Outcome: Not Progressing Goal: Will remain free from infection Outcome: Not Progressing Goal: Diagnostic test results will improve Outcome: Not Progressing Goal: Respiratory complications will improve Outcome: Not Progressing Goal: Cardiovascular complication will be avoided Outcome:  Not Progressing   Problem: Activity: Goal: Risk for activity intolerance will decrease Outcome: Not Progressing   Problem: Coping: Goal: Level of anxiety will decrease Outcome: Not Progressing   Problem: Elimination: Goal: Will not experience complications related to urinary retention Outcome: Not Progressing   Problem: Pain Managment: Goal: General experience of comfort will improve and/or be controlled Outcome: Not Progressing   Problem: Safety: Goal: Ability to remain free from injury will improve Outcome: Not Progressing   Problem: Skin Integrity: Goal: Risk for impaired skin integrity will decrease Outcome: Not Progressing   Problem: Clinical Measurements: Goal: Complications related to the disease process or treatment will be avoided or minimized Outcome: Not Progressing   Problem: Education: Goal: Ability to describe self-care measures that may prevent or decrease complications (Diabetes Survival Skills Education) will improve Outcome: Not Progressing Goal: Individualized Educational Video(s) Outcome: Not Progressing   Problem: Health Behavior/Discharge Planning: Goal: Ability to manage health-related needs will improve Outcome: Not Progressing

## 2024-07-15 LAB — RENAL FUNCTION PANEL
Albumin: 3.3 g/dL — ABNORMAL LOW (ref 3.5–5.0)
Albumin: 3.5 g/dL (ref 3.5–5.0)
Anion gap: 11 (ref 5–15)
Anion gap: 9 (ref 5–15)
BUN: 52 mg/dL — ABNORMAL HIGH (ref 8–23)
BUN: 44 mg/dL — ABNORMAL HIGH (ref 8–23)
CO2: 23 mmol/L (ref 22–32)
CO2: 24 mmol/L (ref 22–32)
Calcium: 7.5 mg/dL — ABNORMAL LOW (ref 8.9–10.3)
Calcium: 8.2 mg/dL — ABNORMAL LOW (ref 8.9–10.3)
Chloride: 110 mmol/L (ref 98–111)
Chloride: 110 mmol/L (ref 98–111)
Creatinine, Ser: 2.55 mg/dL — ABNORMAL HIGH (ref 0.44–1.00)
Creatinine, Ser: 2.03 mg/dL — ABNORMAL HIGH (ref 0.44–1.00)
GFR, Estimated: 19 mL/min — ABNORMAL LOW (ref >60)
GFR, Estimated: 26 mL/min — ABNORMAL LOW (ref >60)
Glucose, Bld: 179 mg/dL — ABNORMAL HIGH (ref 70–99)
Glucose, Bld: 98 mg/dL (ref 70–99)
Phosphorus: 2.6 mg/dL (ref 2.5–4.6)
Phosphorus: 2.6 mg/dL (ref 2.5–4.6)
Potassium: 3.7 mmol/L (ref 3.5–5.1)
Potassium: 3.9 mmol/L (ref 3.5–5.1)
Sodium: 144 mmol/L (ref 135–145)
Sodium: 143 mmol/L (ref 135–145)

## 2024-07-15 LAB — GLUCOSE, CAPILLARY
Glucose-Capillary: 106 mg/dL — ABNORMAL HIGH (ref 70–99)
Glucose-Capillary: 112 mg/dL — ABNORMAL HIGH (ref 70–99)
Glucose-Capillary: 125 mg/dL — ABNORMAL HIGH (ref 70–99)
Glucose-Capillary: 137 mg/dL — ABNORMAL HIGH (ref 70–99)
Glucose-Capillary: 140 mg/dL — ABNORMAL HIGH (ref 70–99)
Glucose-Capillary: 89 mg/dL (ref 70–99)

## 2024-07-15 LAB — CBC
HCT: 23.4 % — ABNORMAL LOW (ref 36.0–46.0)
Hemoglobin: 7.4 g/dL — ABNORMAL LOW (ref 12.0–15.0)
MCH: 27.6 pg (ref 26.0–34.0)
MCHC: 31.6 g/dL (ref 30.0–36.0)
MCV: 87.3 fL (ref 80.0–100.0)
Platelets: 128 K/uL — ABNORMAL LOW (ref 150–400)
RBC: 2.68 MIL/uL — ABNORMAL LOW (ref 3.87–5.11)
RDW: 16.6 % — ABNORMAL HIGH (ref 11.5–15.5)
WBC: 6.2 K/uL (ref 4.0–10.5)
nRBC: 0.5 % — ABNORMAL HIGH (ref 0.0–0.2)

## 2024-07-15 LAB — VITAMIN B12: Vitamin B-12: 1604 pg/mL — ABNORMAL HIGH (ref 180–914)

## 2024-07-15 LAB — FOLATE: Folate: 12.5 ng/mL (ref >5.9)

## 2024-07-15 LAB — PROCALCITONIN: Procalcitonin: 0.17 ng/mL

## 2024-07-15 LAB — MAGNESIUM: Magnesium: 2.3 mg/dL (ref 1.7–2.4)

## 2024-07-15 MED ORDER — ENSURE PLUS HIGH PROTEIN PO LIQD
237.0000 mL | Freq: Two times a day (BID) | ORAL | Status: DC
  Administered 2024-07-15 – 2024-07-20 (×11): 237 mL via ORAL

## 2024-07-15 MED ORDER — ACETAMINOPHEN 325 MG PO TABS
650.0000 mg | ORAL_TABLET | Freq: Four times a day (QID) | ORAL | Status: DC | PRN
  Administered 2024-07-15 – 2024-07-17 (×4): 650 mg via ORAL
  Filled 2024-07-15 (×4): qty 2

## 2024-07-15 MED ORDER — ATORVASTATIN CALCIUM 40 MG PO TABS
40.0000 mg | ORAL_TABLET | Freq: Every day | ORAL | Status: DC
  Administered 2024-07-16 – 2024-07-26 (×11): 40 mg via ORAL
  Filled 2024-07-15 (×11): qty 1

## 2024-07-15 MED ORDER — FERROUS SULFATE 325 (65 FE) MG PO TABS
325.0000 mg | ORAL_TABLET | ORAL | Status: DC
  Administered 2024-07-18 – 2024-07-25 (×4): 325 mg via ORAL
  Filled 2024-07-15 (×4): qty 1

## 2024-07-15 MED ORDER — ASPIRIN 81 MG PO TBEC
81.0000 mg | DELAYED_RELEASE_TABLET | Freq: Every day | ORAL | Status: DC
  Administered 2024-07-16 – 2024-07-26 (×11): 81 mg via ORAL
  Filled 2024-07-15 (×11): qty 1

## 2024-07-15 MED ORDER — QUETIAPINE FUMARATE 50 MG PO TABS
100.0000 mg | ORAL_TABLET | Freq: Every day | ORAL | Status: DC
  Administered 2024-07-15 – 2024-07-18 (×4): 100 mg via ORAL
  Filled 2024-07-15: qty 1
  Filled 2024-07-15 (×3): qty 2

## 2024-07-15 MED ORDER — RENA-VITE PO TABS
1.0000 | ORAL_TABLET | Freq: Every day | ORAL | Status: DC
  Administered 2024-07-15 – 2024-07-25 (×11): 1 via ORAL
  Filled 2024-07-15 (×11): qty 1

## 2024-07-15 MED ORDER — QUETIAPINE FUMARATE 50 MG PO TABS
50.0000 mg | ORAL_TABLET | Freq: Every day | ORAL | Status: DC
  Administered 2024-07-15 – 2024-07-17 (×3): 50 mg via ORAL
  Filled 2024-07-15: qty 1
  Filled 2024-07-15: qty 2
  Filled 2024-07-15: qty 1

## 2024-07-15 NOTE — Progress Notes (Signed)
 eLink Physician-Brief Progress Note Patient Name: Alison Whitehead DOB: [birth year] MRN: [id number]   Date of Service  07/15/2024  HPI/Events of Note  Patient needs restraints renewed for safety.  eICU Interventions  Restraints order renewed.        Alison Whitehead 07/15/2024, 9:10 PM

## 2024-07-15 NOTE — Progress Notes (Signed)
 Nutrition Follow-up  DOCUMENTATION CODES:  Not applicable  INTERVENTION:  Diet recommendations per SLP Currently on dysphagia 3, thin liquids Ensure Plus High Protein po BID, each supplement provides 350 kcal and 20 grams of protein. Magic cup TID with meals, each supplement provides 290 kcal and 9 grams of protein Monitor for nutritional adequacy and necessity for replacement of Cortrak for supplemental nutrition support Renal MVI with minerals daily  NUTRITION DIAGNOSIS:  Inadequate oral intake related to acute illness as evidenced by NPO status. - remains applicable  GOAL:  Patient will meet greater than or equal to 90% of their needs - goal met via TF  MONITOR:  Labs, Weight trends, TF tolerance, I & O's  REASON FOR ASSESSMENT:  Consult Other (Comment) (CRRT protocol)  ASSESSMENT:  Pt admitted with confusion and uremia r/t progression of renal disease. PMH significant for CKD 3b, HTN, HLD, prediabetes  12/9: admitted to ICU, CRRT initiated 12/10: CT head negative; Cortrak placed 1211: CRRT stopped 12/12: self removed Cortrak; dysphagia 3 diet per SLP evaluation  Pt remains with acute metabolic encephalopathy.  CT head negative for acute process.  Renal function improved. CRRT stopped. Nephrology s/o. Plan to d/c non-tunneled cath.   Remains agitated and confused. Self removed Cortrak. Assessed by SLP and diet advanced to dysphagia 3, thin liquids. Plan to allow oral intake and monitor for nutritional adequacy prior to possible replacement of Cortrak. Requires full supervision and limiting of distractions as much as possible during meals.   Admit weight: 104.3 kg Current weight: 104.7 kg  Medications: SSI 0-15 units q4h, rena-vit  Labs:  BUN 52 Cr 2.55 Corrected calcium  8.06 Albumin 3.3 GFR 19 CBG's 114-169 x24 hours  UOP: x12 hours +332ml x 2 hours I/O's: - since admit  Diet Order:   Diet Order             Diet NPO time specified  Diet  effective now                   EDUCATION NEEDS:   No education needs have been identified at this time  Skin:  Skin Assessment: Reviewed RN Assessment  Last BM:  12/12 x2 large type 6  Height:   Ht Readings from Last 1 Encounters:  07/12/24 5' 6 (1.676 m)    Weight:   Wt Readings from Last 1 Encounters:  07/15/24 104.7 kg    Ideal Body Weight:  59.1 kg  BMI:  Body mass index is 37.26 kg/m.  Estimated Nutritional Needs:   Kcal:  1600-1800  Protein:  85-100g  Fluid:  1L + UOP  Royce Maris, RDN, LDN Clinical Nutrition See AMiON for contact information.

## 2024-07-15 NOTE — Progress Notes (Signed)
 Alison Whitehead Progress Note   Assessment/ Plan:   AKI on CKD3b -followed by Dr Dennise as an outpatient. Baseline Cr around 1.4-1.7, last set of labs with me as an OP was on 04/27/24 which showed a Cr of 1.36, eGFR 42, K 4.5 -suspecting etiology to be secondary to ATN secondary to shock -hold SGLT2I, ARB, triamterene -HCTZ - CRRT 12/10-12/11 - recovering - d/c nontunneled cath 0 will sign off- call with questions   Hyperkalemia - resolving with CRRT   Anion Gap Metabolic acidosis/Acidemia -currently on bicarb gtt. Lactate WNL - ok to stop bicarb gtt- following up numbers, at 15 or greater usually fine when on CRRT   Shock -presumed to be septic - CT abd/ pelvis with possible foreign body in sigmoid colon--defer to primary team, may need GI input?   Toxic metabolic encephalopathy Uremic encephalopathy -CTH negative -CRRT as above   Hypothermia -abx/work up per primary service   Anemia -hgb 10.2, acceptable for CKD. Transfuse PRN for Hgb < 7   Hyponatremia -hypervolemic and secondary to severe AKI, lasix  & CRRT as abov  Subjective:    Cr downtrending, improving.  Making urine.  Will d/c nontunneled HD cath.  More awake but confused   Objective:   BP 100/65 (BP Location: Right Arm)   Pulse 78   Temp (!) 96.8 F (36 C)   Resp 19   Ht 5' 6 (1.676 m)   Wt 104.7 kg   SpO2 94%   BMI 37.26 kg/m   Intake/Output Summary (Last 24 hours) at 07/15/2024 1310 Last data filed at 07/15/2024 1100 Gross per 24 hour  Intake 2287.24 ml  Output 1625 ml  Net 662.24 ml   Weight change: 0.9 kg  Physical Exam: Gen:NAD, sedated CVS: RRR Resp: clear, faint snoring Abd: soft, nontender to palpation Ext:no LE edema  Imaging: No results found.   Labs: BMET Recent Labs  Lab 07/12/24 1930 07/12/24 1942 07/12/24 1953 07/13/24 0050 07/13/24 0255 07/13/24 0711 07/13/24 0945 07/13/24 1626 07/14/24 0508 07/15/24 0346  NA 136 133* 131* 138  --   --  136 138  137 144  K >7.5* 7.9* 8.3* 6.8* 6.6* 4.3 3.8 4.4 3.9 3.7  CL 101 106  --  98  --   --  98 102 102 110  CO2 8*  --   --  11*  --   --  19* 23 27 23   GLUCOSE 90 89  --  129*  --   --  190* 145* 137* 179*  BUN 187* >130*  --  176*  --   --  131* 92* 54* 52*  CREATININE 16.15* 18.00*  --  15.83*  --   --  9.99* 6.43* 3.40* 2.55*  CALCIUM  7.0*  --   --  6.7*  --   --  6.5* 6.9* 7.1* 7.5*  PHOS  --   --   --  >30.0*  --   --  9.7* 6.5* 3.2 2.6   CBC Recent Labs  Lab 07/12/24 1930 07/12/24 1942 07/12/24 1953 07/13/24 0050 07/15/24 0346  WBC 4.9  --   --  5.9 6.2  NEUTROABS 3.9  --   --   --   --   HGB 9.3* 10.2* 10.2* 9.0* 7.4*  HCT 30.8* 30.0* 30.0* 29.0* 23.4*  MCV 90.1  --   --  87.1 87.3  PLT 235  --   --  221 128*    Medications:     aspirin  EC  81 mg Oral Daily   atorvastatin   40 mg Oral Daily   Chlorhexidine  Gluconate Cloth  6 each Topical Daily   ferrous sulfate  325 mg Oral Once per day on Monday Wednesday Friday   heparin   5,000 Units Subcutaneous Q8H   insulin  aspart  0-15 Units Subcutaneous Q4H   multivitamin  1 tablet Oral QHS   mupirocin  ointment  1 Application Nasal BID   QUEtiapine  100 mg Oral QHS   QUEtiapine  50 mg Oral Daily   sodium chloride  flush  10-40 mL Intracatheter Q12H    Almarie Bonine, MD 07/15/2024, 1:10 PM

## 2024-07-15 NOTE — Progress Notes (Signed)
 Has had episodes of agitation and yelling out today  B12 and folate are unremarkable  Passed swallow study, so added seroquel BID to help get off precedex  gtt   Tinnie FORBES Furth, PA-C [facility] 07/15/2024 3:56 PM  Please see [email or web address] for pager details.  From 7A-7P if no response, please call [phone] After hours, please call ELink [phone]

## 2024-07-15 NOTE — Progress Notes (Addendum)
 NAME:  Alison Whitehead, MRN:  [id number], DOB:  Jul 08, 1951, LOS: 3 ADMISSION DATE:  07/12/2024, CONSULTATION DATE:  12/9 REFERRING MD:  Dr. Gennaro , CHIEF COMPLAINT:  AKI; hypotension   History of Present Illness:  Patient is a 73 yo F w/ pertinent PMH CKD 3b, HTN, HLD, prediabetes presents to [facility] on 12/9 with AMS.  On 12/9 patient's daughter went to check on patient found confused sitting on edge of bed.  Daughter says patient was under the weather and groggy yesterday but was AO.  EMS transferred to [facility] ED.  Patient confused but AO x 2 initially.  Complaining of abdominal pain.  Abdomen distended with tenderness on palpation.  Also noted to be hypotensive and hypothermic 90 F.  Cultures obtained and given IV fluids.  WBC 4.9, LA WNL, and CXR unremarkable.  UA with small leukocytes.  Patient made hypotensive Levophed .  Patient noted to be hyperkalemic greater than 7.5.  CO2 8, creat 16.15, BUN 187, calcium  7, AG 27.  VBG 7.11, 25, 172, 8.4.  Patient given temporizing medications and nephro consulted.  Recommended CRRT and bicarb drip.  PCCM consulted for ICU admission.  Pertinent  Medical History   Past Medical History:  Diagnosis Date   Allergy    seasonal   Arthritis    CKD (chronic kidney disease)    DDD (degenerative disc disease)    Degenerative disc disease, lumbar    Hypertension    IBS (irritable bowel syndrome)    Osteopenia    Osteoporosis    osteopenia   Stroke (HCC) 09/2019     Significant Hospital Events: Including procedures, antibiotic start and stop dates in addition to other pertinent events   12/9 admit to ICU started on pressors and will place HD catheter and start CRRT 12/10 Started on CRRT overnight  12/11 renal function drastically improved this a.m., nephrology planning to stop CRRT   Interim History / Subjective:  NAEON; this morning pulled out her cortrak and aline. She is pleasantly confused for me. Does not know where she is or why she is  here. Asking to go home.   Objective    Blood pressure 100/65, pulse 79, temperature 98.8 F (37.1 C), resp. rate 16, height 5' 6 (1.676 m), weight 104.7 kg, SpO2 92%.        Intake/Output Summary (Last 24 hours) at 07/15/2024 0726 Last data filed at 07/15/2024 0700 Gross per 24 hour  Intake 2651.74 ml  Output 925 ml  Net 1726.74 ml   Filed Weights   07/13/24 2100 07/14/24 0432 07/15/24 0444  Weight: 103.8 kg 103.1 kg 104.7 kg    Examination: General: acutely ill appearing female, laying in bed, no distress  HEENT: ncat, anicteric sclera, dry mucous membranes  Neuro: awake, a little sleepy, oriented to self and hospital  CV: s1s2, rrr, no murmur  PULM: ctab, room air, resp even and unlabored  HP:dnqu, non tender  Extremities: warm/dry, no edema   Resolved problem list  Hyperkalemia  AGMA Assessment and Plan   AKI on CKD 3B -Creatinine 16.15 with GFR 2 on admission compare to creatinine 1.73 with GFR 02 Jan 2023 Hypocalcemia P: -nephro following, appreciate assistance  -held crrt yesterday, HD/CRRT recommendation per nephro -trend bmp, mag, phos  -renal perfusion  Shock, resolved  -Presumed sepsis although source unclear; likely some abd source given abd pain; small leukocytes in UA -No infectious sources seen on CT abdomen Hypothermia P: - no longer in shock, pressors off  - blood  and urine cultures ngtd  - no persistent diarrhea concerning for GI illness  - CT negative  - discontinue antibiotics  - trend cbc and fever curve   Acute encephalopathy: -Likely uremia related; sepsis? -Head CT negative, TSH normal  -Query if partly medication related? On norco and flexeril  at home P: - wean off precedex   - obtain b12, folate level  - consider MRI if she is not clearing up off sedating medications  - obtain SLP swallow evaluation prior to putting cortrak back in  - neuroprotective measures  - aspiration precautions   Thrombocytopenia  221>128  - trend   - if continues downward - stop SQ heparin  and send HIT panel   HTN/HLD P: -restart home statin  -hold home coreg  and amlodipine -olmesartan  - HR 60s and on dex, with acute renal failure will hold ARB until more stabilized   Prediabetes P: -ssi  -cbg q4h  Anemia of chronic disease P: -trend -start home iron supplementation  -transfuse prn  DDD P: Home medications remain on hold    Critical care time: 42  The patient is critically ill with multiple organ system failure and requires high complexity decision making for assessment and support, frequent evaluation and titration of therapies, advanced monitoring, review of radiographic studies and interpretation of complex data.    Critical Care Time devoted to patient care services, exclusive of separately billable procedures, described in this note is 64   Tinnie FORBES [facility] 07/15/2024 7:27 AM  Please see [email or web address] for pager details.  From 7A-7P if no response, please call [phone] After hours, please call ELink [phone]

## 2024-07-15 NOTE — Evaluation (Signed)
 Clinical/Bedside Swallow Evaluation Patient Details  Name: Alison Whitehead MRN: [id number] Date of Birth: [birth year]  Today's Date: 07/15/2024 Time: SLP Start Time (ACUTE ONLY): 1203 SLP Stop Time (ACUTE ONLY): 1217 SLP Time Calculation (min) (ACUTE ONLY): 14 min  Past Medical History:  Past Medical History:  Diagnosis Date   Allergy    seasonal   Arthritis    CKD (chronic kidney disease)    DDD (degenerative disc disease)    Degenerative disc disease, lumbar    Hypertension    IBS (irritable bowel syndrome)    Osteopenia    Osteoporosis    osteopenia   Stroke (HCC) 09/2019   Past Surgical History:  Past Surgical History:  Procedure Laterality Date   SHOULDER SURGERY Left    lipoma   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 Admitted with sepsis, AKI on CKD 3B, and encephalopathy. CXR negative. CRRT 12/10-12/11. 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 PMH includes prior CVA, arthritis, CKD, DDD, HTN, IBS    Assessment / Plan / Recommendation  Clinical Impression  Recommend starting with Dys 3 solids and thin liquids given edentulism and inattention. Provide full supervision for all PO intake, limiting distractions as much as possible. Will f/u at least briefly.   Pt is pleasantly confused and disoriented but generally redirectable. She is edentulous but her dentures are not available. The initial sip of water  was followed by coughing, which may be indicative of mobilized secretions after prolonged NPO status. This did not recur in any subsequent trial across two 8 oz cups of water . Mastication was mildly prolonged and was further impacted by distractions but  she ultimately achieves complete oral clearance.  SLP Visit Diagnosis: Dysphagia, unspecified (R13.10)    Aspiration Risk  Mild aspiration risk    Diet Recommendation           Other Recommendations Oral Care Recommendations: Oral care BID     Swallow Evaluation Recommendations Recommendations: PO diet PO Diet Recommendation: Dysphagia 3 (Mechanical soft);Thin liquids (Level 0) Liquid Administration via: Cup;Straw Medication Administration: Whole meds with liquid Supervision: Full assist for feeding;Full supervision/cueing for swallowing strategies Swallowing strategies  : Minimize environmental distractions;Slow rate Postural changes: Position pt fully upright for meals Oral care recommendations: Oral care BID (2x/day)   Assistance Recommended at Discharge    Functional Status Assessment Patient has had a recent decline in their functional status and demonstrates the ability to make significant improvements in function in a reasonable and predictable amount of time.  Frequency and Duration min 2x/week  1 week       Prognosis Prognosis for improved oropharyngeal function: Good Barriers to Reach Goals: Cognitive deficits      Swallow Study   General HPI: 73 yo female presenting to ED 12/9 with AMS. Admitted with sepsis, AKI on CKD 3B, and encephalopathy. CXR negative. CRRT 12/10-12/11. 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 PMH includes prior CVA, arthritis, CKD, DDD, HTN, IBS Type of Study: Bedside Swallow Evaluation Previous Swallow Assessment: see HPI Diet Prior to this Study: NPO Temperature Spikes Noted: No Respiratory Status: Room air History of Recent Intubation: No  Behavior/Cognition: Alert;Cooperative;Distractible;Requires cueing Oral Cavity Assessment: Within Functional Limits Oral Care Completed by SLP: No Oral Cavity -  Dentition: Edentulous;Dentures, not available Vision: Functional for self-feeding Self-Feeding Abilities: Total assist Patient Positioning: Upright in bed Baseline Vocal Quality: Normal Volitional Cough: Strong Volitional Swallow: Able to elicit    Oral/Motor/Sensory Function Overall Oral Motor/Sensory Function: Within functional limits   Ice Chips Ice chips: Not tested   Thin Liquid Thin Liquid: Impaired Presentation: Straw Pharyngeal  Phase Impairments: Cough - Immediate    Nectar Thick Nectar Thick Liquid: Not tested   Honey Thick Honey Thick Liquid: Not tested   Puree Puree: Not tested   Solid     Solid: Impaired Oral Phase Impairments: Impaired mastication      Damien Blumenthal, M.A., CCC-SLP Speech Language Pathology, Acute Rehabilitation Services  Secure Chat preferred [phone]  07/15/2024,1:22 PM

## 2024-07-16 DIAGNOSIS — D696 Thrombocytopenia, unspecified: Secondary | ICD-10-CM

## 2024-07-16 DIAGNOSIS — D638 Anemia in other chronic diseases classified elsewhere: Secondary | ICD-10-CM

## 2024-07-16 DIAGNOSIS — N170 Acute kidney failure with tubular necrosis: Principal | ICD-10-CM

## 2024-07-16 DIAGNOSIS — E669 Obesity, unspecified: Secondary | ICD-10-CM

## 2024-07-16 DIAGNOSIS — I129 Hypertensive chronic kidney disease with stage 1 through stage 4 chronic kidney disease, or unspecified chronic kidney disease: Secondary | ICD-10-CM

## 2024-07-16 LAB — CBC WITH DIFFERENTIAL/PLATELET
Abs Immature Granulocytes: 0.12 K/uL — ABNORMAL HIGH (ref 0.00–0.07)
Basophils Absolute: 0 K/uL (ref 0.0–0.1)
Basophils Relative: 1 %
Eosinophils Absolute: 0.4 K/uL (ref 0.0–0.5)
Eosinophils Relative: 5 %
HCT: 26.4 % — ABNORMAL LOW (ref 36.0–46.0)
Hemoglobin: 8 g/dL — ABNORMAL LOW (ref 12.0–15.0)
Immature Granulocytes: 2 %
Lymphocytes Relative: 19 %
Lymphs Abs: 1.5 K/uL (ref 0.7–4.0)
MCH: 26.7 pg (ref 26.0–34.0)
MCHC: 30.3 g/dL (ref 30.0–36.0)
MCV: 88 fL (ref 80.0–100.0)
Monocytes Absolute: 1.5 K/uL — ABNORMAL HIGH (ref 0.1–1.0)
Monocytes Relative: 19 %
Neutro Abs: 4.4 K/uL (ref 1.7–7.7)
Neutrophils Relative %: 54 %
Platelets: 186 K/uL (ref 150–400)
RBC: 3 MIL/uL — ABNORMAL LOW (ref 3.87–5.11)
RDW: 16.9 % — ABNORMAL HIGH (ref 11.5–15.5)
WBC: 7.9 K/uL (ref 4.0–10.5)
nRBC: 0 % (ref 0.0–0.2)

## 2024-07-16 LAB — RENAL FUNCTION PANEL
Albumin: 3.5 g/dL (ref 3.5–5.0)
Anion gap: 9 (ref 5–15)
BUN: 41 mg/dL — ABNORMAL HIGH (ref 8–23)
CO2: 26 mmol/L (ref 22–32)
Calcium: 9.3 mg/dL (ref 8.9–10.3)
Chloride: 109 mmol/L (ref 98–111)
Creatinine, Ser: 1.73 mg/dL — ABNORMAL HIGH (ref 0.44–1.00)
GFR, Estimated: 31 mL/min — ABNORMAL LOW (ref >60)
Glucose, Bld: 71 mg/dL (ref 70–99)
Phosphorus: 1.8 mg/dL — ABNORMAL LOW (ref 2.5–4.6)
Potassium: 3.8 mmol/L (ref 3.5–5.1)
Sodium: 144 mmol/L (ref 135–145)

## 2024-07-16 LAB — GLUCOSE, CAPILLARY
Glucose-Capillary: 93 mg/dL (ref 70–99)
Glucose-Capillary: 131 mg/dL — ABNORMAL HIGH (ref 70–99)
Glucose-Capillary: 122 mg/dL — ABNORMAL HIGH (ref 70–99)
Glucose-Capillary: 64 mg/dL — ABNORMAL LOW (ref 70–99)
Glucose-Capillary: 143 mg/dL — ABNORMAL HIGH (ref 70–99)

## 2024-07-16 MED ORDER — PANTOPRAZOLE SODIUM 40 MG IV SOLR: 40.0000 mg | Freq: Two times a day (BID) | INTRAVENOUS | Status: DC

## 2024-07-16 MED ORDER — PANTOPRAZOLE SODIUM 40 MG PO TBEC
40.0000 mg | DELAYED_RELEASE_TABLET | Freq: Every day | ORAL | Status: DC
  Administered 2024-07-16 – 2024-07-26 (×11): 40 mg via ORAL
  Filled 2024-07-16 (×11): qty 1

## 2024-07-16 MED ORDER — MELATONIN 3 MG PO TABS
3.0000 mg | ORAL_TABLET | Freq: Every evening | ORAL | Status: DC | PRN
  Administered 2024-07-16 – 2024-07-24 (×7): 3 mg via ORAL
  Filled 2024-07-16 (×7): qty 1

## 2024-07-16 MED ORDER — INSULIN ASPART 100 UNIT/ML IJ SOLN
0.0000 [IU] | Freq: Three times a day (TID) | INTRAMUSCULAR | Status: DC
  Administered 2024-07-17: 3 [IU] via SUBCUTANEOUS
  Administered 2024-07-17: 2 [IU] via SUBCUTANEOUS
  Administered 2024-07-17 – 2024-07-18 (×2): 3 [IU] via SUBCUTANEOUS
  Administered 2024-07-18: 09:00:00 5 [IU] via SUBCUTANEOUS
  Administered 2024-07-18 – 2024-07-19 (×3): 3 [IU] via SUBCUTANEOUS
  Administered 2024-07-19 – 2024-07-20 (×2): 5 [IU] via SUBCUTANEOUS
  Administered 2024-07-20: 09:00:00 3 [IU] via SUBCUTANEOUS
  Administered 2024-07-20: 12:00:00 8 [IU] via SUBCUTANEOUS
  Administered 2024-07-21: 17:00:00 5 [IU] via SUBCUTANEOUS
  Administered 2024-07-21: 09:00:00 2 [IU] via SUBCUTANEOUS
  Administered 2024-07-21 – 2024-07-22 (×2): 3 [IU] via SUBCUTANEOUS
  Administered 2024-07-22: 8 [IU] via SUBCUTANEOUS
  Administered 2024-07-22: 2 [IU] via SUBCUTANEOUS
  Administered 2024-07-23 (×3): 3 [IU] via SUBCUTANEOUS
  Administered 2024-07-24: 8 [IU] via SUBCUTANEOUS
  Administered 2024-07-24: 3 [IU] via SUBCUTANEOUS
  Administered 2024-07-24: 5 [IU] via SUBCUTANEOUS
  Administered 2024-07-25 (×3): 3 [IU] via SUBCUTANEOUS
  Administered 2024-07-26: 5 [IU] via SUBCUTANEOUS
  Filled 2024-07-16 (×3): qty 3
  Filled 2024-07-16: qty 2
  Filled 2024-07-16 (×5): qty 3
  Filled 2024-07-16: qty 2
  Filled 2024-07-16 (×2): qty 5
  Filled 2024-07-16: qty 2
  Filled 2024-07-16 (×3): qty 5
  Filled 2024-07-16: qty 3
  Filled 2024-07-16: qty 5
  Filled 2024-07-16 (×2): qty 3
  Filled 2024-07-16: qty 2
  Filled 2024-07-16: qty 8
  Filled 2024-07-16 (×2): qty 3
  Filled 2024-07-16: qty 8
  Filled 2024-07-16: qty 3
  Filled 2024-07-16: qty 8
  Filled 2024-07-16: qty 5
  Filled 2024-07-16: qty 3

## 2024-07-16 NOTE — Progress Notes (Signed)
 NAME:  Alison Whitehead, MRN:  [id number], DOB:  25-Jan-1951, LOS: 4 ADMISSION DATE:  07/12/2024, CONSULTATION DATE:  12/9 REFERRING MD:  Dr. Gennaro , CHIEF COMPLAINT:  AKI; hypotension   History of Present Illness:  Patient is a 73 yo F w/ pertinent PMH CKD 3b, HTN, HLD, prediabetes presents to [facility] on 12/9 with AMS.  On 12/9 patient's daughter went to check on patient found confused sitting on edge of bed.  Daughter says patient was under the weather and groggy yesterday but was AO.  EMS transferred to [facility] ED.  Patient confused but AO x 2 initially.  Complaining of abdominal pain.  Abdomen distended with tenderness on palpation.  Also noted to be hypotensive and hypothermic 90 F.  Cultures obtained and given IV fluids.  WBC 4.9, LA WNL, and CXR unremarkable.  UA with small leukocytes.  Patient made hypotensive Levophed .  Patient noted to be hyperkalemic greater than 7.5.  CO2 8, creat 16.15, BUN 187, calcium  7, AG 27.  VBG 7.11, 25, 172, 8.4.  Patient given temporizing medications and nephro consulted.  Recommended CRRT and bicarb drip.  PCCM consulted for ICU admission.  Pertinent  Medical History   Past Medical History:  Diagnosis Date   Allergy    seasonal   Arthritis    CKD (chronic kidney disease)    DDD (degenerative disc disease)    Degenerative disc disease, lumbar    Hypertension    IBS (irritable bowel syndrome)    Osteopenia    Osteoporosis    osteopenia   Stroke (HCC) 09/2019     Significant Hospital Events: Including procedures, antibiotic start and stop dates in addition to other pertinent events   12/9 admit to ICU started on pressors and will place HD catheter and start CRRT 12/10 Started on CRRT overnight  12/11 renal function drastically improved this a.m., nephrology planning to stop CRRT 12/12 remain agitated and restless continue to require Precedex  infusion.  Remain off CRRT, renal function improving  Interim History / Subjective:  Patient  continues to remain agitated, requiring restraints She is still on Precedex  infusion which is being tapered Remained afebrile Serum creatinine continue to improve  Objective    Blood pressure (!) 153/98, pulse 66, temperature (!) 97.5 F (36.4 C), resp. rate 16, height 5' 6 (1.676 m), weight 106.8 kg, SpO2 90%.        Intake/Output Summary (Last 24 hours) at 07/16/2024 1008 Last data filed at 07/16/2024 0600 Gross per 24 hour  Intake 496.82 ml  Output 2200 ml  Net -1703.18 ml   Filed Weights   07/14/24 0432 07/15/24 0444 07/16/24 0500  Weight: 103.1 kg 104.7 kg 106.8 kg    Examination: General: Acute on chronically ill-appearing elderly obese female, lying on the bed HEENT: [place]/AT, eyes anicteric.  moist mucus membranes Neuro: Alert, awake following commands, restless and agitated Chest: Coarse breath sounds, no wheezes or rhonchi Heart: Regular rate and rhythm, no murmurs or gallops Abdomen: Soft, nontender, nondistended, bowel sounds present  Labs and images reviewed  Patient Lines/Drains/Airways Status     Active Line/Drains/Airways     Name Placement date Placement time Site Days   CVC Triple Lumen 07/13/24 Left Femoral 07/13/24  0030  -- 3   Urethral Catheter yasemia l Temperature probe 14 Fr. 07/12/24  2214  Temperature probe  4            Resolved problem list  Hyperkalemia  AGMA  Assessment and Plan  AKI on CKD  3B, likely due to ischemic ATN from hypovolemic shock Hypocalcemia Patient presented with creatinine 16.1 on admission, her baseline serum creatinine 1.73 with GFR 02 Jan 2023 Remain off CRRT, made 2.5 L of urine in last 24 hours Serum creatinine continues to trend down, currently at 2 and BUN at 44 Continue aggressive electrolyte replacement and monitor  Shock, likely hypovolemic, resolved Sepsis was ruled out, antibiotics. Cultures remain negative Patient is off vasopressors White count remained within normal limits  Acute  encephalopathy, likely due to uremia Patient remained intermittently agitated and restless overnight she required restraint Still on low-dose Precedex , continue to titrate off Continue Seroquel  Passed swallow evaluation, continue dysphagia diet Workup has been negative otherwise except uremia which is improving  Thrombocytopenia, unclear etiology 221>128  Repeat CBC is pending  HTN/HLD Continue statin Hold antihypertensive meds  Prediabetes Patient's hemoglobin A1c is 5.8 Continue sliding scale insulin  with CBG goal 140-180  Anemia of chronic disease Patient hemoglobin is slowly trending down, no signs of active bleeding Monitor H&H and transfuse if less than 7  Obesity Diet and exercise counseling provided   The patient is critically ill due to AKI requiring close monitoring/acute encephalopathy requiring Precedex  infusion titration.  Critical care was necessary to treat or prevent imminent or life-threatening deterioration.  Critical care was time spent personally by me on the following activities: development of treatment plan with patient and/or surrogate as well as nursing, discussions with consultants, evaluation of patient's response to treatment, examination of patient, obtaining history from patient or surrogate, ordering and performing treatments and interventions, ordering and review of laboratory studies, ordering and review of radiographic studies, pulse oximetry, re-evaluation of patient's condition and participation in multidisciplinary rounds.   During this encounter critical care time was devoted to patient care services described in this note for 36 minutes.     Valinda Novas, MD [facility] See Amion for pager If no response to pager, please call [phone] until 7pm After 7pm, Please call E-link [phone]

## 2024-07-16 NOTE — Plan of Care (Signed)
  Problem: Health Behavior/Discharge Planning: Goal: Ability to manage health-related needs will improve Outcome: Progressing   Problem: Education: Goal: Knowledge of General Education information will improve Description: Including pain rating scale, medication(s)/side effects and non-pharmacologic comfort measures Outcome: Progressing   Problem: Safety: Goal: Non-violent Restraint(s) Outcome: Progressing

## 2024-07-16 NOTE — Plan of Care (Signed)
°  Problem: Clinical Measurements: Goal: Ability to maintain clinical measurements within normal limits will improve Outcome: Progressing Goal: Diagnostic test results will improve Outcome: Progressing Goal: Respiratory complications will improve Outcome: Progressing Goal: Cardiovascular complication will be avoided Outcome: Progressing   Problem: Nutrition: Goal: Adequate nutrition will be maintained Outcome: Progressing   Problem: Elimination: Goal: Will not experience complications related to bowel motility Outcome: Progressing Goal: Will not experience complications related to urinary retention Outcome: Progressing   Problem: Pain Managment: Goal: General experience of comfort will improve and/or be controlled Outcome: Progressing   Problem: Coping: Goal: Ability to adjust to condition or change in health will improve Outcome: Progressing   Problem: Fluid Volume: Goal: Ability to maintain a balanced intake and output will improve Outcome: Progressing   Problem: Metabolic: Goal: Ability to maintain appropriate glucose levels will improve Outcome: Progressing   Problem: Safety: Goal: Non-violent Restraint(s) Outcome: Not Progressing   Problem: Education: Goal: Knowledge of General Education information will improve Description: Including pain rating scale, medication(s)/side effects and non-pharmacologic comfort measures Outcome: Not Progressing   Problem: Health Behavior/Discharge Planning: Goal: Ability to manage health-related needs will improve Outcome: Not Progressing   Problem: Activity: Goal: Risk for activity intolerance will decrease Outcome: Not Progressing   Problem: Coping: Goal: Level of anxiety will decrease Outcome: Not Progressing   Problem: Safety: Goal: Ability to remain free from injury will improve Outcome: Not Progressing   Problem: Skin Integrity: Goal: Risk for impaired skin integrity will decrease Outcome: Not Progressing

## 2024-07-16 NOTE — Progress Notes (Signed)
 eLink Physician-Brief Progress Note Patient Name: Alison Whitehead DOB: [birth year] MRN: [id number]   Date of Service  07/16/2024  HPI/Events of Note  Limited IV access on pantoprazole  40 IV BID with request to change to PO No report of GI bleed  Patient not in a camera accessible room  eICU Interventions  Ordered to shift pantoprazole  to 40 mg PO once daily Bedside rounding team to readjust dose if more aggressive acid suppression would be necessary     Intervention Category Intermediate Interventions: Medication change / dose adjustment  Damien T Bhavin Monjaraz 07/16/2024, 10:14 PM

## 2024-07-16 NOTE — Plan of Care (Signed)

## 2024-07-17 ENCOUNTER — Other Ambulatory Visit (HOSPITAL_COMMUNITY): Payer: Self-pay

## 2024-07-17 DIAGNOSIS — N179 Acute kidney failure, unspecified: Secondary | ICD-10-CM

## 2024-07-17 LAB — CBC WITH DIFFERENTIAL/PLATELET
Abs Immature Granulocytes: 0.11 K/uL — ABNORMAL HIGH (ref 0.00–0.07)
Basophils Absolute: 0 K/uL (ref 0.0–0.1)
Basophils Relative: 1 %
Eosinophils Absolute: 0.3 K/uL (ref 0.0–0.5)
Eosinophils Relative: 4 %
HCT: 25.7 % — ABNORMAL LOW (ref 36.0–46.0)
Hemoglobin: 8 g/dL — ABNORMAL LOW (ref 12.0–15.0)
Immature Granulocytes: 1 %
Lymphocytes Relative: 21 %
Lymphs Abs: 1.6 K/uL (ref 0.7–4.0)
MCH: 26.8 pg (ref 26.0–34.0)
MCHC: 31.1 g/dL (ref 30.0–36.0)
MCV: 86 fL (ref 80.0–100.0)
Monocytes Absolute: 1.5 K/uL — ABNORMAL HIGH (ref 0.1–1.0)
Monocytes Relative: 19 %
Neutro Abs: 4.1 K/uL (ref 1.7–7.7)
Neutrophils Relative %: 54 %
Platelets: 181 K/uL (ref 150–400)
RBC: 2.99 MIL/uL — ABNORMAL LOW (ref 3.87–5.11)
RDW: 16.9 % — ABNORMAL HIGH (ref 11.5–15.5)
WBC: 7.6 K/uL (ref 4.0–10.5)
nRBC: 0 % (ref 0.0–0.2)

## 2024-07-17 LAB — RENAL FUNCTION PANEL
Albumin: 3.3 g/dL — ABNORMAL LOW (ref 3.5–5.0)
Anion gap: 10 (ref 5–15)
BUN: 28 mg/dL — ABNORMAL HIGH (ref 8–23)
CO2: 28 mmol/L (ref 22–32)
Calcium: 9 mg/dL (ref 8.9–10.3)
Chloride: 104 mmol/L (ref 98–111)
Creatinine, Ser: 1.66 mg/dL — ABNORMAL HIGH (ref 0.44–1.00)
GFR, Estimated: 33 mL/min — ABNORMAL LOW (ref >60)
Glucose, Bld: 112 mg/dL — ABNORMAL HIGH (ref 70–99)
Phosphorus: 1.7 mg/dL — ABNORMAL LOW (ref 2.5–4.6)
Potassium: 3.9 mmol/L (ref 3.5–5.1)
Sodium: 142 mmol/L (ref 135–145)

## 2024-07-17 LAB — CULTURE, BLOOD (ROUTINE X 2)
Culture: NO GROWTH
Culture: NO GROWTH
Special Requests: ADEQUATE

## 2024-07-17 LAB — GLUCOSE, CAPILLARY
Glucose-Capillary: 127 mg/dL — ABNORMAL HIGH (ref 70–99)
Glucose-Capillary: 167 mg/dL — ABNORMAL HIGH (ref 70–99)
Glucose-Capillary: 184 mg/dL — ABNORMAL HIGH (ref 70–99)

## 2024-07-17 LAB — MAGNESIUM: Magnesium: 1.8 mg/dL (ref 1.7–2.4)

## 2024-07-17 MED ORDER — HYDROCODONE-ACETAMINOPHEN 5-325 MG PO TABS
1.0000 | ORAL_TABLET | Freq: Once | ORAL | Status: AC | PRN
  Administered 2024-07-17: 1 via ORAL
  Filled 2024-07-17: qty 1

## 2024-07-17 MED ORDER — HYDROCODONE-ACETAMINOPHEN 10-325 MG PO TABS
1.0000 | ORAL_TABLET | Freq: Four times a day (QID) | ORAL | Status: DC | PRN
  Administered 2024-07-17 – 2024-07-26 (×25): 1 via ORAL
  Filled 2024-07-17 (×28): qty 1

## 2024-07-17 MED ORDER — OXYCODONE-ACETAMINOPHEN 5-325 MG PO TABS: 1.0000 | ORAL_TABLET | Freq: Once | ORAL | Status: DC | PRN

## 2024-07-17 MED ORDER — AMLODIPINE BESYLATE 10 MG PO TABS
10.0000 mg | ORAL_TABLET | Freq: Every day | ORAL | Status: DC
  Administered 2024-07-17 – 2024-07-23 (×7): 10 mg via ORAL
  Filled 2024-07-17 (×7): qty 1

## 2024-07-17 MED ORDER — SODIUM PHOSPHATES 45 MMOLE/15ML IV SOLN
30.0000 mmol | Freq: Once | INTRAVENOUS | Status: AC
  Administered 2024-07-17: 30 mmol via INTRAVENOUS
  Filled 2024-07-17: qty 10

## 2024-07-17 NOTE — Progress Notes (Addendum)
 PROGRESS NOTE    Alison Whitehead  FMW:[id number] DOB: [birth year] DOA: 07/12/2024 PCP: Bridgette Sluder, PA    Brief Narrative:  Patient is a 73 yo F w/ pertinent PMH CKD 3b, HTN, HLD, prediabetes presents to [facility] on 12/9 with AMS.  Found to have acute kidney injury and underwent CRRT and bicarb drip.   Assessment and Plan:  AKI on CKD 3B, likely due to ischemic ATN from hypovolemic shock Hypocalcemia Patient presented with creatinine 16.1 on admission, her baseline serum creatinine 1.73 with GFR 02 Jan 2023 Remain off CRRT and nephrology has signed off Serum creatinine continues to trend down -Plan to remove Foley   Shock, likely hypovolemic, resolved Sepsis was ruled out Cultures remain negative Patient is off vasopressors White count remained within normal limits   Acute encephalopathy, likely due to uremia -Appear back to baseline - Will slowly wean off Seroquel  that was started in the ICU  Low phosphorus - Replete  Thrombocytopenia, unclear etiology -Resolved   HTN/HLD Continue statin - Start to resume blood pressure meds   Prediabetes Patient's hemoglobin A1c is 5.8 Continue sliding scale insulin  with CBG goal 140-180   Anemia of chronic disease -no signs of active bleeding Monitor H&H and transfuse if less than 7   Obesity Estimated body mass index is 37.2 kg/m as calculated from the following:   Height as of this encounter: 5' 6 (1.676 m).   Weight as of this encounter: 104.6 kg.    Foreign body in: - Discussed with PCCM-does not appear to be causing her issues and should pass on own  PT EVAL   DVT prophylaxis: heparin  injection 5,000 Units Start: 07/13/24 0600    Code Status: Full Code   Disposition Plan:  Level of care: Progressive Status is: Inpatient     Consultants:  Renal PCCM   Subjective: C/o headache-- take pain meds BID At home  Objective: Vitals:   07/17/24 0028 07/17/24 0446 07/17/24 0600 07/17/24 0752  BP: (!)  140/81 (!) 147/94  (!) 161/91  Pulse: (!) 106 81  97  Resp: 19 20  17   Temp: 98.5 F (36.9 C) 98.6 F (37 C)  98.6 F (37 C)  TempSrc: Oral Oral  Oral  SpO2: 100% 100%    Weight:  101.2 kg 104.6 kg   Height:        Intake/Output Summary (Last 24 hours) at 07/17/2024 1127 Last data filed at 07/16/2024 2049 Gross per 24 hour  Intake 100.3 ml  Output 1725 ml  Net -1624.7 ml   Filed Weights   07/16/24 0500 07/17/24 0446 07/17/24 0600  Weight: 106.8 kg 101.2 kg 104.6 kg    Examination:   General: Appearance:    Obese female in no acute distress     Lungs:     respirations unlabored  Heart:    Normal heart rate. Normal rhythm. No murmurs, rubs, or gallops.    MS:   All extremities are intact.    Neurologic:   Awake, alert       Data Reviewed: I have personally reviewed following labs and imaging studies  CBC: Recent Labs  Lab 07/12/24 1930 07/12/24 1942 07/12/24 1953 07/13/24 0050 07/15/24 0346 07/16/24 1520 07/17/24 0546  WBC 4.9  --   --  5.9 6.2 7.9 7.6  NEUTROABS 3.9  --   --   --   --  4.4 4.1  HGB 9.3*   < > 10.2* 9.0* 7.4* 8.0* 8.0*  HCT 30.8*   < >  30.0* 29.0* 23.4* 26.4* 25.7*  MCV 90.1  --   --  87.1 87.3 88.0 86.0  PLT 235  --   --  221 128* 186 181   < > = values in this interval not displayed.   Basic Metabolic Panel: Recent Labs  Lab 07/12/24 2213 07/13/24 0050 07/13/24 0255 07/14/24 0508 07/15/24 0346 07/15/24 1810 07/16/24 1520 07/17/24 0546  NA  --  138   < > 137 144 143 144 142  K  --  6.8*   < > 3.9 3.7 3.9 3.8 3.9  CL  --  98   < > 102 110 110 109 104  CO2  --  11*   < > 27 23 24 26 28   GLUCOSE  --  129*   < > 137* 179* 98 71 112*  BUN  --  176*   < > 54* 52* 44* 41* 28*  CREATININE  --  15.83*   < > 3.40* 2.55* 2.03* 1.73* 1.66*  CALCIUM   --  6.7*   < > 7.1* 7.5* 8.2* 9.3 9.0  MG 2.9* 3.0*  --  2.2 2.3  --   --  1.8  PHOS  --  >30.0*   < > 3.2 2.6 2.6 1.8* 1.7*   < > = values in this interval not displayed.    GFR: Estimated Creatinine Clearance: 37.4 mL/min (A) (by C-G formula based on SCr of 1.66 mg/dL (H)). Liver Function Tests: Recent Labs  Lab 07/12/24 1930 07/13/24 0050 07/13/24 0945 07/14/24 0508 07/15/24 0346 07/15/24 1810 07/16/24 1520 07/17/24 0546  AST 20 16  --   --   --   --   --   --   ALT 14 13  --   --   --   --   --   --   ALKPHOS 67 62  --   --   --   --   --   --   BILITOT 0.7 0.8  --   --   --   --   --   --   PROT 6.6 6.5  --   --   --   --   --   --   ALBUMIN  3.3* 3.3*   < > 2.8* 3.3* 3.5 3.5 3.3*   < > = values in this interval not displayed.   Recent Labs  Lab 07/12/24 1930  LIPASE 46   Recent Labs  Lab 07/13/24 0255  AMMONIA 38*   Coagulation Profile: Recent Labs  Lab 07/12/24 1930  INR 1.2   Cardiac Enzymes: No results for input(s): CKTOTAL, CKMB, CKMBINDEX, TROPONINI in the last 168 hours. BNP (last 3 results) No results for input(s): PROBNP in the last 8760 hours. HbA1C: No results for input(s): HGBA1C in the last 72 hours. CBG: Recent Labs  Lab 07/16/24 0749 07/16/24 1120 07/16/24 1521 07/16/24 1932 07/17/24 0747  GLUCAP 131* 122* 64* 143* 127*   Lipid Profile: No results for input(s): CHOL, HDL, LDLCALC, TRIG, CHOLHDL, LDLDIRECT in the last 72 hours. Thyroid Function Tests: No results for input(s): TSH, T4TOTAL, FREET4, T3FREE, THYROIDAB in the last 72 hours. Anemia Panel: Recent Labs    07/15/24 1150  VITAMINB12 1,604*  FOLATE 12.5   Sepsis Labs: Recent Labs  Lab 07/12/24 1942 07/12/24 2213 07/12/24 2219 07/15/24 1150  PROCALCITON  --  0.31  --  0.17  LATICACIDVEN 0.8  --  1.6  --     Recent Results (from the past  240 hours)  Blood Culture (routine x 2)     Status: None   Collection Time: 07/12/24  7:29 PM   Specimen: BLOOD RIGHT FOREARM  Result Value Ref Range Status   Specimen Description BLOOD RIGHT FOREARM  Final   Special Requests   Final    BOTTLES DRAWN AEROBIC AND  ANAEROBIC Blood Culture adequate volume   Culture   Final    NO GROWTH 5 DAYS Performed at [facility], [address], [place], [place] [address]    Report Status 07/17/2024 FINAL  Final  Blood Culture (routine x 2)     Status: None   Collection Time: 07/12/24  7:29 PM   Specimen: BLOOD LEFT FOREARM  Result Value Ref Range Status   Specimen Description BLOOD LEFT FOREARM  Final   Special Requests   Final    BOTTLES DRAWN AEROBIC AND ANAEROBIC Blood Culture results may not be optimal due to an inadequate volume of blood received in culture bottles   Culture   Final    NO GROWTH 5 DAYS Performed at [facility], [address] [place], [place] [address]    Report Status 07/17/2024 FINAL  Final  Resp panel by RT-PCR (RSV, Flu A&B, Covid) Anterior Nasal Swab     Status: None   Collection Time: 07/12/24  7:29 PM   Specimen: Anterior Nasal Swab  Result Value Ref Range Status   SARS Coronavirus 2 by RT PCR NEGATIVE NEGATIVE Final   Influenza A by PCR NEGATIVE NEGATIVE Final   Influenza B by PCR NEGATIVE NEGATIVE Final    Comment: (NOTE) The Xpert Xpress SARS-CoV-2/FLU/RSV plus assay is intended as an aid in the diagnosis of influenza from Nasopharyngeal swab specimens and should not be used as a sole basis for treatment. Nasal washings and aspirates are unacceptable for Xpert Xpress SARS-CoV-2/FLU/RSV testing.  Fact Sheet for Patients: bloggercourse.com  Fact Sheet for Healthcare Providers: seriousbroker.it  This test is not yet approved or cleared by the [place]  FDA and has been authorized for detection and/or diagnosis of SARS-CoV-2 by FDA under an Emergency Use Authorization (EUA). This EUA will remain in effect (meaning this test can be used) for the duration of the COVID-19 declaration under Section 564(b)(1) of the Act, 21 U.S.C. section 360bbb-3(b)(1), unless the authorization is terminated  or revoked.     Resp Syncytial Virus by PCR NEGATIVE NEGATIVE Final    Comment: (NOTE) Fact Sheet for Patients: bloggercourse.com  Fact Sheet for Healthcare Providers: seriousbroker.it  This test is not yet approved or cleared by the [place]  FDA and has been authorized for detection and/or diagnosis of SARS-CoV-2 by FDA under an Emergency Use Authorization (EUA). This EUA will remain in effect (meaning this test can be used) for the duration of the COVID-19 declaration under Section 564(b)(1) of the Act, 21 U.S.C. section 360bbb-3(b)(1), unless the authorization is terminated or revoked.  Performed at [facility], [address], [place], [place] [address]   MRSA Next Gen by PCR, Nasal     Status: Abnormal   Collection Time: 07/12/24 10:28 PM   Specimen: Nasal Mucosa; Nasal Swab  Result Value Ref Range Status   MRSA by PCR Next Gen DETECTED (A) NOT DETECTED Final    Comment: CRITICAL RESULT CALLED TO, READ BACK BY AND VERIFIED WITH:  MEJIA RN 07/13/2024 @ 0204 by DD (NOTE) The GeneXpert MRSA Assay (FDA approved for NASAL specimens only), is one component of a comprehensive MRSA  colonization surveillance program. It is not intended to diagnose MRSA infection nor to guide or monitor treatment for MRSA infections. Test performance is not FDA approved in patients less than 62 years old. Performed at [facility], [address] [place], [place] [address]   Urine Culture (for pregnant, neutropenic or urologic patients or patients with an indwelling urinary catheter)     Status: None   Collection Time: 07/13/24  2:55 AM   Specimen: Urine, Clean Catch  Result Value Ref Range Status   Specimen Description URINE, CLEAN CATCH  Final   Special Requests NONE  Final   Culture   Final    NO GROWTH Performed at [facility], [address], [place], [place] [address]    Report Status 07/14/2024 FINAL  Final          Radiology Studies: No results found.      Scheduled Meds:  amLODipine   10 mg Oral Daily   aspirin  EC  81 mg Oral Daily   atorvastatin   40 mg Oral Daily   Chlorhexidine  Gluconate Cloth  6 each Topical Daily   feeding supplement  237 mL Oral BID BM   ferrous sulfate   325 mg Oral Once per day on Monday Wednesday Friday   heparin   5,000 Units Subcutaneous Q8H   insulin  aspart  0-15 Units Subcutaneous TID WC   multivitamin  1 tablet Oral QHS   mupirocin  ointment  1 Application Nasal BID   pantoprazole   40 mg Oral Daily   QUEtiapine   100 mg Oral QHS   QUEtiapine   50 mg Oral Daily   Continuous Infusions:  sodium PHOSPHATE  IVPB (in mmol) 30 mmol (07/17/24 0922)     LOS: 5 days    Time spent: 45 minutes spent on chart review, discussion with nursing staff, consultants, updating family and interview/physical exam; more than 50% of that time was spent in counseling and/or coordination of care.    Harlene RAYMOND Bowl, DO [facility] Available via Epic secure chat 7am-7pm After these hours, please refer to coverage provider listed on [email or web address] 07/17/2024, 11:27 AM

## 2024-07-17 NOTE — Evaluation (Addendum)
 Physical Therapy Evaluation Patient Details Name: Alison Whitehead MRN: [id number] DOB: 07-29-1951 Today's Date: 07/17/2024  History of Present Illness  Patient is a 73y.o. female admitted on 07/12/24 after being found with AMS by her daughter. Her PMH includes: CKD stage 3, HTN, HLD, and prediabetes. She was found to have AKI requiring CRRT and bicarb drip.  Clinical Impression  Prior to admission, patient was living along in a first floor apartment and IND with all ADLs and using a RW for mobility. Currently, she requires assistance for sit<>supine and sit<>stand with mobility overall limited by ongoing dizziness with mobility. Elevated HR noted with mobility maintaining 130-140 while seated at EOB for 5-10 minutes and with brief standing. Education patient on deep breathing exercises. Deficits include possible vestibular involvement, decreased muscular strength and endurance, impaired balance, impaired cognition, limited caregiver support at home, and decreased functional independence below her baseline. She will benefit from high intensity therapy following discharge for >3hrs/day to improve above noted deficits. PT will continue to follow while she is in acute hospital.         If plan is discharge home, recommend the following: A little help with walking and/or transfers;A little help with bathing/dressing/bathroom   Can travel by private vehicle        Equipment Recommendations None recommended by PT;Other (comment) (defer to next level of care)  Recommendations for Other Services  OT consult;Rehab consult    Functional Status Assessment Patient has had a recent decline in their functional status and demonstrates the ability to make significant improvements in function in a reasonable and predictable amount of time.     Precautions / Restrictions Precautions Precautions: Fall Recall of Precautions/Restrictions: Impaired Precaution/Restrictions Comments: continued cognitive  deficites secondary to AMS and AKI but overall improving Restrictions Weight Bearing Restrictions Per Provider Order: No      Mobility  Bed Mobility Overal bed mobility: Needs Assistance Bed Mobility: Sit to Supine, Supine to Sit     Supine to sit: Contact guard Sit to supine: Contact guard assist   General bed mobility comments: pt reports dizziness once seated at EOB which did not resolve after 5 mintes, possible vestibular component involved    Transfers Overall transfer level: Needs assistance Equipment used: Rolling walker (2 wheels) Transfers: Sit to/from Stand Sit to Stand: Min assist           General transfer comment: chronic pain bilateral knees exaccerbated in standing, h/o L TKA and need for one on RLE. Continued dizziness once standing, deferred other mobility at this time out of concern for safety    Ambulation/Gait                  Stairs            Wheelchair Mobility     Tilt Bed    Modified Rankin (Stroke Patients Only)       Balance Overall balance assessment: Needs assistance Sitting-balance support: Feet supported Sitting balance-Leahy Scale: Fair     Standing balance support: Bilateral upper extremity supported Standing balance-Leahy Scale: Fair Standing balance comment: requires support on RW in standing, did not assess dynamic standing balance                             Pertinent Vitals/Pain Pain Assessment Pain Assessment: No/denies pain    Home Living Family/patient expects to be discharged to:: Private residence Living Arrangements: Alone Available Help at Discharge: Family;Available PRN/intermittently (daughter is  near and can check on patient) Type of Home: Apartment Home Access: Level entry       Home Layout: One level Home Equipment: Agricultural Consultant (2 wheels);Cane - single point Additional Comments: prior to admission, she was IND with ADLs, IADLs, driving and using a RW for mobility     Prior Function Prior Level of Function : Independent/Modified Independent;Driving             Mobility Comments: ambulates with RW ADLs Comments: IND     Extremity/Trunk Assessment   Upper Extremity Assessment Upper Extremity Assessment: Generalized weakness    Lower Extremity Assessment Lower Extremity Assessment: Generalized weakness    Cervical / Trunk Assessment Cervical / Trunk Assessment: Normal  Communication   Communication Communication: No apparent difficulties Factors Affecting Communication: Hearing impaired    Cognition Arousal: Alert Behavior During Therapy: WFL for tasks assessed/performed   PT - Cognitive impairments: Safety/Judgement, Memory, Problem solving, Orientation   Orientation impairments: Situation, Time                   PT - Cognition Comments: continued cognitive deficits secondary to AMS and AKI, improving overall; difficulty remembering sequences of events that lead up to today. Doesn't remember being agitated and combative in ICU Following commands: Intact       Cueing Cueing Techniques: Verbal cues, Gestural cues, Tactile cues     General Comments      Exercises     Assessment/Plan    PT Assessment Patient needs continued PT services  PT Problem List Decreased strength;Decreased activity tolerance;Decreased mobility;Decreased balance;Decreased cognition;Decreased safety awareness       PT Treatment Interventions Gait training;Functional mobility training;Therapeutic activities;Therapeutic exercise;Balance training    PT Goals (Current goals can be found in the Care Plan section)  Acute Rehab PT Goals Patient Stated Goal: get my strength back PT Goal Formulation: With patient/family Time For Goal Achievement: 07/31/24 Potential to Achieve Goals: Good    Frequency Min 3X/week     Co-evaluation               AM-PAC PT 6 Clicks Mobility  Outcome Measure Help needed turning from your back to your  side while in a flat bed without using bedrails?: A Little Help needed moving from lying on your back to sitting on the side of a flat bed without using bedrails?: A Little Help needed moving to and from a bed to a chair (including a wheelchair)?: A Lot Help needed standing up from a chair using your arms (e.g., wheelchair or bedside chair)?: A Little Help needed to walk in hospital room?: A Lot Help needed climbing 3-5 steps with a railing? : Total 6 Click Score: 14    End of Session Equipment Utilized During Treatment: Gait belt Activity Tolerance: Patient tolerated treatment well;Other (comment) (limited by ongoing dizziness when seated and standing) Patient left: in bed;with call bell/phone within reach;with bed alarm set;with family/visitor present Nurse Communication: Mobility status;Other (comment) (ongoing dizziness, elevated HR) PT Visit Diagnosis: Unsteadiness on feet (R26.81);Difficulty in walking, not elsewhere classified (R26.2);Muscle weakness (generalized) (M62.81)    Time: 8483-8461 PT Time Calculation (min) (ACUTE ONLY): 22 min   Charges:   PT Evaluation $PT Eval Moderate Complexity: 1 Mod PT Treatments $Therapeutic Activity: 8-22 mins           Isaiah DEL. Criss Pallone, PT, DPT   [employer] 07/17/2024, 4:17 PM

## 2024-07-17 NOTE — Progress Notes (Signed)
 Inpatient Rehab Admissions Coordinator Note:   Per PT patient was screened for CIR candidacy by Pegge Cumberledge SHAUNNA Yvone Cohens, CCC-SLP. At this time, pt appears to be a potential candidate for CIR. I will place an order for rehab consult for full assessment, per our protocol.  Please contact me any with questions.[name] Tinnie Yvone Cohens, MS, CCC-SLP Admissions Coordinator [phone] 07/17/2024 4:31 PM

## 2024-07-17 NOTE — Progress Notes (Signed)
 eLink Physician-Brief Progress Note Patient Name: Alison Whitehead DOB: [birth year] MRN: [id number]   Date of Service  07/17/2024  HPI/Events of Note  Patient complaining 10/10 Headache, no blurred vision with it and patient is back to baseline (A/Ox4) and takes Norco at home 10/325 and was wondering if we could do a one time dose of that or something to help with HA  eICU Interventions  Ordered a one time dose of Norco 5-325 prn for severe pain        Damien ONEIDA Grout 07/17/2024, 6:05 AM

## 2024-07-18 ENCOUNTER — Other Ambulatory Visit: Payer: Self-pay

## 2024-07-18 ENCOUNTER — Encounter (HOSPITAL_COMMUNITY): Payer: Self-pay

## 2024-07-18 DIAGNOSIS — R5381 Other malaise: Secondary | ICD-10-CM

## 2024-07-18 DIAGNOSIS — G9341 Metabolic encephalopathy: Secondary | ICD-10-CM

## 2024-07-18 DIAGNOSIS — R531 Weakness: Secondary | ICD-10-CM

## 2024-07-18 DIAGNOSIS — M1711 Unilateral primary osteoarthritis, right knee: Secondary | ICD-10-CM

## 2024-07-18 LAB — RENAL FUNCTION PANEL
Albumin: 3.1 g/dL — ABNORMAL LOW (ref 3.5–5.0)
Anion gap: 9 (ref 5–15)
BUN: 31 mg/dL — ABNORMAL HIGH (ref 8–23)
CO2: 25 mmol/L (ref 22–32)
Calcium: 8.7 mg/dL — ABNORMAL LOW (ref 8.9–10.3)
Chloride: 107 mmol/L (ref 98–111)
Creatinine, Ser: 1.78 mg/dL — ABNORMAL HIGH (ref 0.44–1.00)
GFR, Estimated: 30 mL/min — ABNORMAL LOW (ref >60)
Glucose, Bld: 155 mg/dL — ABNORMAL HIGH (ref 70–99)
Phosphorus: 2.9 mg/dL (ref 2.5–4.6)
Potassium: 3.8 mmol/L (ref 3.5–5.1)
Sodium: 141 mmol/L (ref 135–145)

## 2024-07-18 LAB — CBC
HCT: 25.7 % — ABNORMAL LOW (ref 36.0–46.0)
Hemoglobin: 7.8 g/dL — ABNORMAL LOW (ref 12.0–15.0)
MCH: 26.7 pg (ref 26.0–34.0)
MCHC: 30.4 g/dL (ref 30.0–36.0)
MCV: 88 fL (ref 80.0–100.0)
Platelets: 214 K/uL (ref 150–400)
RBC: 2.92 MIL/uL — ABNORMAL LOW (ref 3.87–5.11)
RDW: 16.9 % — ABNORMAL HIGH (ref 11.5–15.5)
WBC: 6.9 K/uL (ref 4.0–10.5)
nRBC: 0 % (ref 0.0–0.2)

## 2024-07-18 LAB — GLUCOSE, CAPILLARY
Glucose-Capillary: 211 mg/dL — ABNORMAL HIGH (ref 70–99)
Glucose-Capillary: 152 mg/dL — ABNORMAL HIGH (ref 70–99)
Glucose-Capillary: 180 mg/dL — ABNORMAL HIGH (ref 70–99)
Glucose-Capillary: 173 mg/dL — ABNORMAL HIGH (ref 70–99)

## 2024-07-18 MED ORDER — CARVEDILOL 6.25 MG PO TABS
6.2500 mg | ORAL_TABLET | Freq: Two times a day (BID) | ORAL | Status: DC
  Administered 2024-07-18 – 2024-07-26 (×17): 6.25 mg via ORAL
  Filled 2024-07-18 (×17): qty 1

## 2024-07-18 MED ORDER — ACETAMINOPHEN 325 MG PO TABS
650.0000 mg | ORAL_TABLET | Freq: Four times a day (QID) | ORAL | Status: DC | PRN
  Administered 2024-07-20 – 2024-07-25 (×6): 650 mg via ORAL
  Filled 2024-07-18 (×8): qty 2

## 2024-07-18 MED ORDER — SODIUM CHLORIDE 0.9 % IV SOLN: INTRAVENOUS | Status: AC

## 2024-07-18 NOTE — Consult Note (Signed)
 Physical Medicine and Rehabilitation Consult Reason for Consult: Impaired functional mobility Referring Physician: Juvenal   HPI: Alison Whitehead is a 73 y.o. 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  She was found to be in acute kidney failure and underwent CRRT and was placed on a bicarb drip.  Creatinine improved to her baseline of 1.78 as of today.  Cognition seems to be improving.  Blood cultures remain negative as part of sepsis workup.  Foley catheter was removed today.  Patient has been seen by therapies and has been min assist for sit to stand transfers.  Gait was not attempted as of yet as she has been dizzy upon standing.  Occupational Therapy assessment pending.  Home: Home Living Family/patient expects to be discharged to:: Private residence Living Arrangements: Alone Available Help at Discharge: Family, Available PRN/intermittently (daughter is near and can check on patient) Type of Home: Apartment Home Access: Level entry Home Layout: One level Bathroom Shower/Tub: Engineer, Manufacturing Systems: Standard Home Equipment: Agricultural Consultant (2 wheels), [employer] - single point Additional Comments: prior to admission, she was IND with ADLs, IADLs, driving and using a RW for mobility  Functional History: Prior Function Prior Level of Function : Independent/Modified Independent, Driving Mobility Comments: ambulates with RW ADLs Comments: IND Functional Status:  Mobility: Bed Mobility Overal bed mobility: Needs Assistance Bed Mobility: Sit to Supine, Supine to Sit Supine to sit: Contact guard Sit to supine: Contact guard assist General bed mobility comments: pt reports dizziness once seated at EOB which did not resolve after 5 mintes, possible vestibular  component involved Transfers Overall transfer level: Needs assistance Equipment used: Rolling walker (2 wheels) Transfers: Sit to/from Stand Sit to Stand: Min assist General transfer comment: chronic pain bilateral knees exaccerbated in standing, h/o L TKA and need for one on RLE. Continued dizziness once standing, deferred other mobility at this time out of concern for safety      ADL:    Cognition: Cognition Orientation Level: Oriented to person, Oriented to place, Oriented to time, Disoriented to situation Cognition Arousal: Alert Behavior During Therapy: [facility] for tasks assessed/performed   Review of Systems  Constitutional:  Positive for malaise/fatigue. Negative for fever.  HENT: Negative.    Eyes: Negative.   Respiratory:  Positive for shortness of breath.   Cardiovascular:  Positive for leg swelling.  Gastrointestinal:  Negative for abdominal pain.  Genitourinary:  Negative for hematuria.  Musculoskeletal:  Positive for back pain and joint pain.  Skin:  Negative for rash.  Neurological:  Positive for dizziness and weakness.  Psychiatric/Behavioral:  Negative for depression.    Past Medical History:  Diagnosis Date   Allergy    seasonal   Arthritis    CKD (chronic kidney disease)    DDD (degenerative disc disease)    Degenerative disc disease, lumbar    Hypertension    IBS (irritable bowel syndrome)    Osteopenia    Osteoporosis    osteopenia   Stroke (HCC) 09/2019   Past Surgical History:  Procedure Laterality Date   SHOULDER SURGERY Left    lipoma   TOTAL KNEE ARTHROPLASTY Left 12/26/2022   Procedure: LEFT TOTAL KNEE ARTHROPLASTY;  Surgeon: Harden Jerona GAILS, MD;  Location: [facility] OR;  Service: Orthopedics;  Laterality: Left;   Family History  Problem  Relation Age of Onset   Breast cancer Maternal Aunt    Diabetes Mother    Hypertension Mother    Glaucoma Mother    Other Father        accident   Diabetes Sister    Hypertension Sister    Cancer Sister     Colon cancer Neg Hx    Colon polyps Neg Hx    Esophageal cancer Neg Hx    Stomach cancer Neg Hx    Rectal cancer Neg Hx    Social History:  reports that she quit smoking about 4 years ago. Her smoking use included cigarettes. She has never used smokeless tobacco. She reports that she does not drink alcohol and does not use drugs. Allergies: Allergies[1] Facility-Administered Medications Prior to Admission  Medication Dose Route Frequency Provider Last Rate Last Admin   diclofenac  Sodium (VOLTAREN ) 1 % topical gel 2 g  2 g Topical QID Duda, Marcus V, MD       Medications Prior to Admission  Medication Sig Dispense Refill   amLODipine -olmesartan  (AZOR ) 10-40 MG tablet Take 1 tablet by mouth daily.     aspirin  EC 81 MG tablet Take 1 tablet (81 mg total) by mouth daily.     atorvastatin  (LIPITOR) 40 MG tablet Take 1 tablet (40 mg total) by mouth daily. 90 tablet 1   carvedilol  (COREG ) 25 MG tablet Take 25 mg by mouth 2 (two) times daily.     CREON  36000-114000 units CPEP capsule Take 36,000 Units by mouth in the morning and at bedtime.     cyclobenzaprine  (FLEXERIL ) 5 MG tablet TAKE 1 TABLET BY MOUTH THREE TIMES A DAY AS NEEDED FOR MUSCLE SPASMS 30 tablet 0   dapagliflozin  propanediol (FARXIGA ) 10 MG TABS tablet Take 1 tablet (10 mg total) by mouth daily. 90 tablet 3   dicyclomine  (BENTYL ) 10 MG capsule Take 1 to 2 Capsules by mouth four times daily, as needed, FOR IBS (Patient taking differently: Take 10-20 mg by mouth 4 (four) times daily as needed for spasms.) 120 capsule 0   ferrous sulfate  325 (65 FE) MG tablet Take 325 mg by mouth 3 (three) times a week.     HYDROcodone -acetaminophen  (NORCO) 10-325 MG tablet Take 1 tablet by mouth every 6 (six) hours as needed for moderate pain. 30 tablet 0   Melatonin 5 MG CAPS Take 5 mg by mouth at bedtime.     metFORMIN  (GLUCOPHAGE ) 1000 MG tablet Take 1,000 mg by mouth every evening.     torsemide (DEMADEX) 5 MG tablet Take 5 mg by mouth daily.      naloxone (NARCAN) nasal spray 4 mg/0.1 mL SMARTSIG:Both Nares (Patient not taking: Reported on 10/22/2022)     OZEMPIC , 1 MG/DOSE, 4 MG/3ML SOPN Inject 1 mg into the skin once a week.       Blood pressure 109/81, pulse (!) 131, temperature 98.2 F (36.8 C), temperature source Oral, resp. rate 19, height 5' 6 (1.676 m), weight 104 kg, SpO2 98%. Physical Exam Constitutional:      General: She is not in acute distress.    Appearance: She is obese.  HENT:     Head: Normocephalic.     Right Ear: External ear normal.     Left Ear: External ear normal.     Nose: Nose normal.  Eyes:     Pupils: Pupils are equal, round, and reactive to light.  Cardiovascular:     Rate and Rhythm: Tachycardia present.  Pulmonary:  Effort: Pulmonary effort is normal.  Abdominal:     Palpations: Abdomen is soft.  Musculoskeletal:        General: Swelling and tenderness present.     Cervical back: Normal range of motion.     Comments: Right knee remains tender and pt wearing knee sleeve. Cannot fully extend right knee, Right shoulder with ROM limitations as well   Skin:    General: Skin is warm.     Coloration: Skin is not jaundiced.     Findings: No bruising.     Comments: Old left TKA scar  Neurological:     Comments: Pt is alert, oriented, remembers me but not all the details of our relationship. Recalls my 3 sons and asked about them though. CN nerve exam non-focal. Normal language and speech. MMT: BUE 4+ to 5/5. RLE:3/5 HF and 2+ KE, 4/5 ADF/PF. LLE 4/5 prox to distal. Sensory exam normal for light touch and pain in all 4 limbs. No limb ataxia or cerebellar signs. No abnormal tone appreciated.    Psychiatric:        Mood and Affect: Mood normal.     Results for orders placed or performed during the hospital encounter of 07/12/24 (from the past 24 hours)  Glucose, capillary     Status: Abnormal   Collection Time: 07/17/24  4:36 PM  Result Value Ref Range   Glucose-Capillary 184 (H) 70 - 99  mg/dL  Renal function panel (daily at 0500)     Status: Abnormal   Collection Time: 07/18/24  5:45 AM  Result Value Ref Range   Sodium 141 135 - 145 mmol/L   Potassium 3.8 3.5 - 5.1 mmol/L   Chloride 107 98 - 111 mmol/L   CO2 25 22 - 32 mmol/L   Glucose, Bld 155 (H) 70 - 99 mg/dL   BUN 31 (H) 8 - 23 mg/dL   Creatinine, Ser 8.21 (H) 0.44 - 1.00 mg/dL   Calcium  8.7 (L) 8.9 - 10.3 mg/dL   Phosphorus 2.9 2.5 - 4.6 mg/dL   Albumin  3.1 (L) 3.5 - 5.0 g/dL   GFR, Estimated 30 (L) >60 mL/min   Anion gap 9 5 - 15  CBC     Status: Abnormal   Collection Time: 07/18/24  5:45 AM  Result Value Ref Range   WBC 6.9 4.0 - 10.5 K/uL   RBC 2.92 (L) 3.87 - 5.11 MIL/uL   Hemoglobin 7.8 (L) 12.0 - 15.0 g/dL   HCT 74.2 (L) 63.9 - 53.9 %   MCV 88.0 80.0 - 100.0 fL   MCH 26.7 26.0 - 34.0 pg   MCHC 30.4 30.0 - 36.0 g/dL   RDW 83.0 (H) 88.4 - 84.4 %   Platelets 214 150 - 400 K/uL   nRBC 0.0 0.0 - 0.2 %  Glucose, capillary     Status: Abnormal   Collection Time: 07/18/24  7:59 AM  Result Value Ref Range   Glucose-Capillary 211 (H) 70 - 99 mg/dL  Glucose, capillary     Status: Abnormal   Collection Time: 07/18/24 11:45 AM  Result Value Ref Range   Glucose-Capillary 152 (H) 70 - 99 mg/dL   No results found.  Assessment/Plan: Diagnosis: 73 year old female with a history of severe right knee OA and prior stroke who was admitted with debility and encephalopathy due to uremia and hypovolemic shock. Does the need for close, 24 hr/day medical supervision in concert with the patient's rehab needs make it unreasonable for this patient to be served  in a less intensive setting? Yes Co-Morbidities requiring supervision/potential complications:  - Acute kidney failure on chronic kidney failure stage IIIb -Thrombocytopenia -Hypertension -Anemia chronic disease -Moderate obesity Due to bladder management, bowel management, safety, skin/wound care, disease management, medication administration, pain management,  and patient education, does the patient require 24 hr/day rehab nursing? Yes Does the patient require coordinated care of a physician, rehab nurse, therapy disciplines of PT, OT, ?SLP to address physical and functional deficits in the context of the above medical diagnosis(es)? Yes Addressing deficits in the following areas: balance, endurance, locomotion, strength, transferring, bowel/bladder control, bathing, dressing, feeding, grooming, toileting, cognition, and psychosocial support Can the patient actively participate in an intensive therapy program of at least 3 hrs of therapy per day at least 5 days per week? Yes The potential for patient to make measurable gains while on inpatient rehab is excellent Anticipated functional outcomes upon discharge from inpatient rehab are modified independent and supervision  with PT, modified independent and supervision with OT, modified independent and supervision with SLP. Estimated rehab length of stay to reach the above functional goals is: 8-11 days Anticipated discharge destination: Home Overall Rehab/Functional Prognosis: excellent  POST ACUTE RECOMMENDATIONS: This patient's condition is appropriate for continued rehabilitative care in the following setting: CIR Patient has agreed to participate in recommended program. Yes Note that insurance prior authorization may be required for reimbursement for recommended care.  Comment: Patient lives alone.  Daughter apparently can check in on her and provide some assistance.  I know this lady well and she is not back to cognitive baseline as of today.  She would benefit from an intensive program to improve her functional mobility.  We might look at alternatives for her right knee as well from a orthotic standpoint. Rehab Admissions Coordinator to follow up.      I have personally performed a face to face diagnostic evaluation of this patient. Additionally, I have examined the patient's medical record including  any pertinent labs and radiographic images.    Thanks,  Arthea ONEIDA Gunther, MD 07/18/2024     [1]  Allergies Allergen Reactions   Penicillins Itching

## 2024-07-18 NOTE — Progress Notes (Addendum)
 PROGRESS NOTE    Alison Whitehead  FMW:[id number] DOB: 06-07-1951 DOA: 07/12/2024 PCP: Bridgette Sluder, PA    Brief Narrative:  Patient is a 73 yo F w/ pertinent PMH CKD 3b, HTN, HLD, prediabetes presents to [facility] on 12/9 with AMS.  Found to have acute kidney injury and underwent CRRT and bicarb drip.  Patient's creatinine seems to be back to baseline.  Current plan is to be evaluated for discharge to CIR   Assessment and Plan:  AKI on CKD 3B, likely due to ischemic ATN from hypovolemic shock Hypocalcemia Patient presented with creatinine 16.1 on admission, her baseline serum creatinine 1.73 with GFR 02 Jan 2023 Remain off CRRT and nephrology has signed off Serum creatinine at baseline - Foley removed   Shock, likely hypovolemic, resolved Sepsis was ruled out Cultures remain negative Patient is off vasopressors White count remained within normal limits   Acute encephalopathy, likely due to uremia -Appear back to baseline - Will slowly wean off Seroquel  that was started in the ICU  Low phosphorus - Repleted  Thrombocytopenia, unclear etiology -Resolved   HTN/HLD Continue statin - Start to resume blood pressure meds as tolerates   Prediabetes Patient's hemoglobin A1c is 5.8 Continue sliding scale insulin  with CBG goal 140-180   Anemia of chronic disease -no signs of active bleeding Monitor H&H and transfuse if less than 7   Obesity Estimated body mass index is 37.01 kg/m as calculated from the following:   Height as of this encounter: 5' 6 (1.676 m).   Weight as of this encounter: 104 kg.    Foreign body in: - Discussed with PCCM-does not appear to be causing her issues and should pass on own  PT EVAL-CIR   DVT prophylaxis: heparin  injection 5,000 Units Start: 07/13/24 0600    Code Status: Full Code   Disposition Plan:  Level of care: Telemetry Status is: Inpatient     Consultants:  Renal PCCM   Subjective: Overall feeling better but having  dreams of her mother who died several years ago  Objective: Vitals:   07/18/24 0356 07/18/24 0756 07/18/24 0854 07/18/24 1141  BP: (!) 141/96 118/81 118/81 109/81  Pulse: 95 (!) 106  (!) 131  Resp: 16 18  19   Temp: 98.7 F (37.1 C) 98.4 F (36.9 C)  98.2 F (36.8 C)  TempSrc: Oral Oral  Oral  SpO2: 100% 99%  98%  Weight: 104 kg     Height:        Intake/Output Summary (Last 24 hours) at 07/18/2024 1200 Last data filed at 07/18/2024 0357 Gross per 24 hour  Intake 200 ml  Output 400 ml  Net -200 ml   Filed Weights   07/17/24 0446 07/17/24 0600 07/18/24 0356  Weight: 101.2 kg 104.6 kg 104 kg    Examination:   General: Appearance:    Obese female in no acute distress     Lungs:     respirations unlabored  Heart:    Tachycardic. Normal rhythm. No murmurs, rubs, or gallops.    MS:   All extremities are intact.    Neurologic:   Awake, alert       Data Reviewed: I have personally reviewed following labs and imaging studies  CBC: Recent Labs  Lab 07/12/24 1930 07/12/24 1942 07/13/24 0050 07/15/24 0346 07/16/24 1520 07/17/24 0546 07/18/24 0545  WBC 4.9  --  5.9 6.2 7.9 7.6 6.9  NEUTROABS 3.9  --   --   --  4.4 4.1  --  HGB 9.3*   < > 9.0* 7.4* 8.0* 8.0* 7.8*  HCT 30.8*   < > 29.0* 23.4* 26.4* 25.7* 25.7*  MCV 90.1  --  87.1 87.3 88.0 86.0 88.0  PLT 235  --  221 128* 186 181 214   < > = values in this interval not displayed.   Basic Metabolic Panel: Recent Labs  Lab 07/12/24 2213 07/13/24 0050 07/13/24 0255 07/14/24 0508 07/15/24 0346 07/15/24 1810 07/16/24 1520 07/17/24 0546 07/18/24 0545  NA  --  138   < > 137 144 143 144 142 141  K  --  6.8*   < > 3.9 3.7 3.9 3.8 3.9 3.8  CL  --  98   < > 102 110 110 109 104 107  CO2  --  11*   < > 27 23 24 26 28 25   GLUCOSE  --  129*   < > 137* 179* 98 71 112* 155*  BUN  --  176*   < > 54* 52* 44* 41* 28* 31*  CREATININE  --  15.83*   < > 3.40* 2.55* 2.03* 1.73* 1.66* 1.78*  CALCIUM   --  6.7*   < > 7.1*  7.5* 8.2* 9.3 9.0 8.7*  MG 2.9* 3.0*  --  2.2 2.3  --   --  1.8  --   PHOS  --  >30.0*   < > 3.2 2.6 2.6 1.8* 1.7* 2.9   < > = values in this interval not displayed.   GFR: Estimated Creatinine Clearance: 34.8 mL/min (A) (by C-G formula based on SCr of 1.78 mg/dL (H)). Liver Function Tests: Recent Labs  Lab 07/12/24 1930 07/13/24 0050 07/13/24 0945 07/15/24 0346 07/15/24 1810 07/16/24 1520 07/17/24 0546 07/18/24 0545  AST 20 16  --   --   --   --   --   --   ALT 14 13  --   --   --   --   --   --   ALKPHOS 67 62  --   --   --   --   --   --   BILITOT 0.7 0.8  --   --   --   --   --   --   PROT 6.6 6.5  --   --   --   --   --   --   ALBUMIN  3.3* 3.3*   < > 3.3* 3.5 3.5 3.3* 3.1*   < > = values in this interval not displayed.   Recent Labs  Lab 07/12/24 1930  LIPASE 46   Recent Labs  Lab 07/13/24 0255  AMMONIA 38*   Coagulation Profile: Recent Labs  Lab 07/12/24 1930  INR 1.2   Cardiac Enzymes: No results for input(s): CKTOTAL, CKMB, CKMBINDEX, TROPONINI in the last 168 hours. BNP (last 3 results) No results for input(s): PROBNP in the last 8760 hours. HbA1C: No results for input(s): HGBA1C in the last 72 hours. CBG: Recent Labs  Lab 07/17/24 0747 07/17/24 1151 07/17/24 1636 07/18/24 0759 07/18/24 1145  GLUCAP 127* 167* 184* 211* 152*   Lipid Profile: No results for input(s): CHOL, HDL, LDLCALC, TRIG, CHOLHDL, LDLDIRECT in the last 72 hours. Thyroid Function Tests: No results for input(s): TSH, T4TOTAL, FREET4, T3FREE, THYROIDAB in the last 72 hours. Anemia Panel: No results for input(s): VITAMINB12, FOLATE, FERRITIN, TIBC, IRON, RETICCTPCT in the last 72 hours.  Sepsis Labs: Recent Labs  Lab 07/12/24 1942 07/12/24 2213 07/12/24 2219 07/15/24  1150  PROCALCITON  --  0.31  --  0.17  LATICACIDVEN 0.8  --  1.6  --     Recent Results (from the past 240 hours)  Blood Culture (routine x 2)     Status:  None   Collection Time: 07/12/24  7:29 PM   Specimen: BLOOD RIGHT FOREARM  Result Value Ref Range Status   Specimen Description BLOOD RIGHT FOREARM  Final   Special Requests   Final    BOTTLES DRAWN AEROBIC AND ANAEROBIC Blood Culture adequate volume   Culture   Final    NO GROWTH 5 DAYS Performed at [facility], [address], [place], [place] [address]    Report Status 07/17/2024 FINAL  Final  Blood Culture (routine x 2)     Status: None   Collection Time: 07/12/24  7:29 PM   Specimen: BLOOD LEFT FOREARM  Result Value Ref Range Status   Specimen Description BLOOD LEFT FOREARM  Final   Special Requests   Final    BOTTLES DRAWN AEROBIC AND ANAEROBIC Blood Culture results may not be optimal due to an inadequate volume of blood received in culture bottles   Culture   Final    NO GROWTH 5 DAYS Performed at [facility], [address] [place], [place] [address]    Report Status 07/17/2024 FINAL  Final  Resp panel by RT-PCR (RSV, Flu A&B, Covid) Anterior Nasal Swab     Status: None   Collection Time: 07/12/24  7:29 PM   Specimen: Anterior Nasal Swab  Result Value Ref Range Status   SARS Coronavirus 2 by RT PCR NEGATIVE NEGATIVE Final   Influenza A by PCR NEGATIVE NEGATIVE Final   Influenza B by PCR NEGATIVE NEGATIVE Final    Comment: (NOTE) The Xpert Xpress SARS-CoV-2/FLU/RSV plus assay is intended as an aid in the diagnosis of influenza from Nasopharyngeal swab specimens and should not be used as a sole basis for treatment. Nasal washings and aspirates are unacceptable for Xpert Xpress SARS-CoV-2/FLU/RSV testing.  Fact Sheet for Patients: bloggercourse.com  Fact Sheet for Healthcare Providers: seriousbroker.it  This test is not yet approved or cleared by the [place]  FDA and has been authorized for detection and/or diagnosis of SARS-CoV-2 by FDA under an Emergency Use Authorization (EUA). This EUA will  remain in effect (meaning this test can be used) for the duration of the COVID-19 declaration under Section 564(b)(1) of the Act, 21 U.S.C. section 360bbb-3(b)(1), unless the authorization is terminated or revoked.     Resp Syncytial Virus by PCR NEGATIVE NEGATIVE Final    Comment: (NOTE) Fact Sheet for Patients: bloggercourse.com  Fact Sheet for Healthcare Providers: seriousbroker.it  This test is not yet approved or cleared by the [place]  FDA and has been authorized for detection and/or diagnosis of SARS-CoV-2 by FDA under an Emergency Use Authorization (EUA). This EUA will remain in effect (meaning this test can be used) for the duration of the COVID-19 declaration under Section 564(b)(1) of the Act, 21 U.S.C. section 360bbb-3(b)(1), unless the authorization is terminated or revoked.  Performed at [facility], [address] [place], [place] [address]   MRSA Next Gen by PCR, Nasal     Status: Abnormal   Collection Time: 07/12/24 10:28 PM   Specimen: Nasal Mucosa; Nasal Swab  Result Value Ref Range Status   MRSA by PCR Next Gen DETECTED (A) NOT DETECTED Final    Comment: CRITICAL RESULT CALLED TO, READ BACK BY  AND VERIFIED WITH:  MEJIA RN 07/13/2024 @ 0204 by DD (NOTE) The GeneXpert MRSA Assay (FDA approved for NASAL specimens only), is one component of a comprehensive MRSA colonization surveillance program. It is not intended to diagnose MRSA infection nor to guide or monitor treatment for MRSA infections. Test performance is not FDA approved in patients less than 71 years old. Performed at [facility], [address] [place], [place] [address]   Urine Culture (for pregnant, neutropenic or urologic patients or patients with an indwelling urinary catheter)     Status: None   Collection Time: 07/13/24  2:55 AM   Specimen: Urine, Clean Catch  Result Value Ref Range Status   Specimen Description URINE,  CLEAN CATCH  Final   Special Requests NONE  Final   Culture   Final    NO GROWTH Performed at [facility], [address] [place], [place] [address]    Report Status 07/14/2024 FINAL  Final         Radiology Studies: No results found.      Scheduled Meds:  amLODipine   10 mg Oral Daily   aspirin  EC  81 mg Oral Daily   atorvastatin   40 mg Oral Daily   Chlorhexidine  Gluconate Cloth  6 each Topical Daily   feeding supplement  237 mL Oral BID BM   ferrous sulfate   325 mg Oral Once per day on Monday Wednesday Friday   heparin   5,000 Units Subcutaneous Q8H   insulin  aspart  0-15 Units Subcutaneous TID WC   multivitamin  1 tablet Oral QHS   pantoprazole   40 mg Oral Daily   QUEtiapine   100 mg Oral QHS   Continuous Infusions:     LOS: 6 days    Time spent: 45 minutes spent on chart review, discussion with nursing staff, consultants, updating family and interview/physical exam; more than 50% of that time was spent in counseling and/or coordination of care.    Harlene RAYMOND Bowl, DO [facility] Available via Epic secure chat 7am-7pm After these hours, please refer to coverage provider listed on [email or web address] 07/18/2024, 12:00 PM

## 2024-07-18 NOTE — Progress Notes (Signed)
 Mobility Specialist Progress Note:    07/18/24 1030  Mobility  Activity Pivoted/transferred from bed to chair  Level of Assistance Moderate assist, patient does 50-74%  Assistive Device Front wheel walker  Distance Ambulated (ft) 3 ft  Activity Response Tolerated well  Mobility Referral Yes  Mobility visit 1 Mobility  Mobility Specialist Start Time (ACUTE ONLY) 1010  Mobility Specialist Stop Time (ACUTE ONLY) 1025  Mobility Specialist Time Calculation (min) (ACUTE ONLY) 15 min   Pt received in bed agreeable to mobility. MinA for bed mobility and ModA for STS. Needed x2 attempts to stand. Pts HR spiked to 140 but pt had no c/o nor symptoms, BP was 109/81. Was able to take a few steps towards the chair. Left in chair w/ call bell and personal belongings in reach. All needs met. Chair alarm on RN aware.   Thersia Minder Mobility Specialist  Please contact vis Secure Chat or  Rehab Office [phone]

## 2024-07-18 NOTE — Plan of Care (Signed)
°  Problem: Education: Goal: Knowledge of General Education information will improve Description: Including pain rating scale, medication(s)/side effects and non-pharmacologic comfort measures Outcome: Progressing   Problem: Health Behavior/Discharge Planning: Goal: Ability to manage health-related needs will improve Outcome: Progressing   Problem: Clinical Measurements: Goal: Ability to maintain clinical measurements within normal limits will improve Outcome: Progressing Goal: Will remain free from infection Outcome: Progressing Goal: Diagnostic test results will improve Outcome: Progressing Goal: Respiratory complications will improve Outcome: Progressing Goal: Cardiovascular complication will be avoided Outcome: Progressing   Problem: Activity: Goal: Risk for activity intolerance will decrease Outcome: Progressing   Problem: Nutrition: Goal: Adequate nutrition will be maintained Outcome: Progressing   Problem: Coping: Goal: Level of anxiety will decrease Outcome: Progressing   Problem: Elimination: Goal: Will not experience complications related to bowel motility Outcome: Progressing Goal: Will not experience complications related to urinary retention Outcome: Progressing   Problem: Pain Managment: Goal: General experience of comfort will improve and/or be controlled Outcome: Progressing   Problem: Safety: Goal: Ability to remain free from injury will improve Outcome: Progressing   Problem: Skin Integrity: Goal: Risk for impaired skin integrity will decrease Outcome: Progressing   Problem: Clinical Measurements: Goal: Complications related to the disease process or treatment will be avoided or minimized Outcome: Progressing   Problem: Education: Goal: Ability to describe self-care measures that may prevent or decrease complications (Diabetes Survival Skills Education) will improve Outcome: Progressing Goal: Individualized Educational Video(s) Outcome:  Progressing   Problem: Coping: Goal: Ability to adjust to condition or change in health will improve Outcome: Progressing   Problem: Fluid Volume: Goal: Ability to maintain a balanced intake and output will improve Outcome: Progressing   Problem: Health Behavior/Discharge Planning: Goal: Ability to identify and utilize available resources and services will improve Outcome: Progressing Goal: Ability to manage health-related needs will improve Outcome: Progressing   Problem: Metabolic: Goal: Ability to maintain appropriate glucose levels will improve Outcome: Progressing   Problem: Nutritional: Goal: Maintenance of adequate nutrition will improve Outcome: Progressing Goal: Progress toward achieving an optimal weight will improve Outcome: Progressing   Problem: Skin Integrity: Goal: Risk for impaired skin integrity will decrease Outcome: Progressing   Problem: Tissue Perfusion: Goal: Adequacy of tissue perfusion will improve Outcome: Progressing

## 2024-07-18 NOTE — Progress Notes (Signed)
 Speech Language Pathology Treatment: Dysphagia  Patient Details Name: Alison Whitehead MRN: [id number] DOB: 15-Jul-1951 Today's Date: 07/18/2024 Time: 8595-8583 SLP Time Calculation (min) (ACUTE ONLY): 12 min  Assessment / Plan / Recommendation Clinical Impression  Pt's cognition much improved reporting she does not recall initial swallow assessment. She does not use dentures at home and eats regular texture. Today mastication with regular texture was swift and efficient. Consumed approximately 2 oz water  without indications of decreased airway protection and denies difficulty past 2 days. Texture upgraded to regular, continue thin , pills with liquids and no further ST needed.    HPI HPI: 73 yo female presenting to ED 12/9 with AMS. Admitted with sepsis, AKI on CKD 3B, and encephalopathy. CXR negative. CRRT 12/10-12/11. 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 PMH includes prior CVA, arthritis, CKD, DDD, HTN, IBS      SLP Plan  All goals met;Discharge SLP treatment due to (comment)        Swallow Evaluation Recommendations   Recommendations: PO diet PO Diet Recommendation: Regular;Thin liquids (Level 0) Liquid Administration via: Cup;Straw Medication Administration: Whole meds with liquid Supervision: Patient able to self-feed Postural changes: Position pt fully upright for meals Oral care recommendations: Oral care BID (2x/day)     Recommendations                     Oral care BID   None Dysphagia, unspecified (R13.10)     All goals met;Discharge SLP treatment due to (comment)     Dustin Olam Bull  07/18/2024, 2:21 PM

## 2024-07-18 NOTE — Progress Notes (Signed)
 Inpatient Rehab Coordinator Note:  I met with patient at bedside to discuss CIR recommendations and goals/expectations of CIR stay.  We reviewed 3 hrs/day of therapy, physician follow up, and average length of stay 2 weeks (dependent upon progress) with goals of supervision to mod I.  Per pt her daughter can check on her intermittently, she does work.  I've left a message for her daughter to update as well.  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   Reche Lowers, PT, DPT Admissions Coordinator [phone] 07/18/2024 4:16 PM

## 2024-07-18 NOTE — Evaluation (Signed)
 Occupational Therapy Evaluation Patient Details Name: Alison Whitehead MRN: [id number] DOB: Jun 19, 1951 Today's Date: 07/18/2024   History of Present Illness   Patient is a 73y.o. female admitted on 07/12/24 after being found with AMS by her daughter. Her PMH includes: CKD stage 3, HTN, HLD, and prediabetes. She was found to have AKI requiring CRRT and bicarb drip.     Clinical Impressions Pt presents with decline in function and safety with ADLs and ADL mobility with impaired strength, balance and endurance. PTA pt reports that she lives alone and that she was Ind with ADLs, IADLs, cooks, drives and uses a RW and cane for mobility. Pt currently requires heavy mod A with 2 trials STS from chair to RW for SPTs min A. No c/o dizziness; pt does reports that she does experience dizziness sometimes during standing. Pt also requires set up with grooming and UB selfcare seated and mod/max A with LB ADLs/selfcare. OT will follow acutely to maximize level of function and safety     If plan is discharge home, recommend the following:   A lot of help with bathing/dressing/bathroom;A lot of help with walking and/or transfers;Assistance with cooking/housework;Assist for transportation;Help with stairs or ramp for entrance     Functional Status Assessment   Patient has had a recent decline in their functional status and demonstrates the ability to make significant improvements in function in a reasonable and predictable amount of time.     Equipment Recommendations   Other (comment) (defer)     Recommendations for Other Services   Rehab consult     Precautions/Restrictions   Precautions Precautions: Fall Recall of Precautions/Restrictions: Impaired Precaution/Restrictions Comments: continued cognitive deficites secondary to AMS and AKI but overall improving Restrictions Weight Bearing Restrictions Per Provider Order: No     Mobility Bed Mobility               General  bed mobility comments: pt in chair    Transfers Overall transfer level: Needs assistance Equipment used: Rolling walker (2 wheels) Transfers: Sit to/from Stand Sit to Stand: Mod assist           General transfer comment: heavy mod A with 2 trials STS from chair to RW for SPTs min A. No c/o dizziness; tpt does reports that she does experience dizziness sometimes during standing      Balance Overall balance assessment: Needs assistance Sitting-balance support: Feet supported Sitting balance-Leahy Scale: Good     Standing balance support: Bilateral upper extremity supported, During functional activity Standing balance-Leahy Scale: Poor                             ADL either performed or assessed with clinical judgement   ADL Overall ADL's : Needs assistance/impaired Eating/Feeding: Independent   Grooming: Wash/dry hands;Wash/dry face;Set up;Supervision/safety;Sitting   Upper Body Bathing: Set up;Supervision/ safety;Sitting   Lower Body Bathing: Moderate assistance   Upper Body Dressing : Set up;Supervision/safety;Sitting   Lower Body Dressing: Maximal assistance   Toilet Transfer: Moderate assistance;Minimal assistance;Rolling walker (2 wheels);Stand-pivot;Cueing for safety;Cueing for sequencing   Toileting- Clothing Manipulation and Hygiene: Moderate assistance;Sit to/from stand       Functional mobility during ADLs: Moderate assistance;Minimal assistance;Rollator (4 wheels);Cueing for safety General ADL Comments: no c/o dizziness     Vision Baseline Vision/History: 1 Wears glasses Ability to See in Adequate Light: 0 Adequate Patient Visual Report: No change from baseline       Perception  Praxis         Pertinent Vitals/Pain Pain Assessment Pain Assessment: No/denies pain     Extremity/Trunk Assessment Upper Extremity Assessment Upper Extremity Assessment: Generalized weakness;Right hand dominant   Lower Extremity  Assessment Lower Extremity Assessment: Defer to PT evaluation       Communication Communication Communication: No apparent difficulties   Cognition Arousal: Alert Behavior During Therapy: WFL for tasks assessed/performed Cognition: No family/caregiver present to determine baseline             OT - Cognition Comments: decreased insight into deficits                 Following commands: Intact       Cueing  General Comments   Cueing Techniques: Verbal cues;Gestural cues;Tactile cues      Exercises     Shoulder Instructions      Home Living Family/patient expects to be discharged to:: Private residence Living Arrangements: Alone Available Help at Discharge: Family;Available PRN/intermittently Type of Home: Apartment Home Access: Level entry     Home Layout: One level     Bathroom Shower/Tub: Chief Strategy Officer: Standard     Home Equipment: Agricultural Consultant (2 wheels);Cane - single point   Additional Comments: prior to admission, she was IND with ADLs, IADLs, driving and using a RW for mobility      Prior Functioning/Environment Prior Level of Function : Independent/Modified Independent;Driving             Mobility Comments: ambulates with RW ADLs Comments: Ind with ADLs, IADLs, cooking    OT Problem List: Decreased strength;Decreased knowledge of use of DME or AE;Decreased activity tolerance;Impaired balance (sitting and/or standing);Decreased safety awareness   OT Treatment/Interventions: Self-care/ADL training;Therapeutic exercise;Patient/family education;Balance training;Therapeutic activities;DME and/or AE instruction      OT Goals(Current goals can be found in the care plan section)   Acute Rehab OT Goals Patient Stated Goal: go home OT Goal Formulation: With patient Time For Goal Achievement: 08/01/24 Potential to Achieve Goals: Good ADL Goals Pt Will Perform Grooming: with min assist;with contact guard  assist;standing Pt Will Perform Lower Body Bathing: with min assist;sitting/lateral leans;sit to/from stand Pt Will Perform Lower Body Dressing: with mod assist;with min assist;sitting/lateral leans;sit to/from stand Pt Will Transfer to Toilet: with min assist;with contact guard assist;stand pivot transfer Pt Will Perform Toileting - Clothing Manipulation and hygiene: with min assist;sitting/lateral leans;sit to/from stand   OT Frequency:  Min 2X/week    Co-evaluation              AM-PAC OT 6 Clicks Daily Activity     Outcome Measure Help from another person eating meals?: None Help from another person taking care of personal grooming?: A Little Help from another person toileting, which includes using toliet, bedpan, or urinal?: A Lot Help from another person bathing (including washing, rinsing, drying)?: A Lot Help from another person to put on and taking off regular upper body clothing?: A Little Help from another person to put on and taking off regular lower body clothing?: A Lot 6 Click Score: 16   End of Session Equipment Utilized During Treatment: Gait belt;Rolling walker (2 wheels);Other (comment) [facility]) Nurse Communication: Mobility status  Activity Tolerance: Patient tolerated treatment well Patient left: in chair;with call bell/phone within reach;with chair alarm set  OT Visit Diagnosis: Unsteadiness on feet (R26.81);Other abnormalities of gait and mobility (R26.89);Muscle weakness (generalized) (M62.81)  Time: 8777-8751 OT Time Calculation (min): 26 min Charges:  OT General Charges $OT Visit: 1 Visit OT Evaluation $OT Eval Moderate Complexity: 1 Mod OT Treatments $Self Care/Home Management : 8-22 mins   Jacques Karna Loose 07/18/2024, 2:17 PM

## 2024-07-19 LAB — CBC
HCT: 26.4 % — ABNORMAL LOW (ref 36.0–46.0)
Hemoglobin: 7.7 g/dL — ABNORMAL LOW (ref 12.0–15.0)
MCH: 26.3 pg (ref 26.0–34.0)
MCHC: 29.2 g/dL — ABNORMAL LOW (ref 30.0–36.0)
MCV: 90.1 fL (ref 80.0–100.0)
Platelets: 252 K/uL (ref 150–400)
RBC: 2.93 MIL/uL — ABNORMAL LOW (ref 3.87–5.11)
RDW: 17.2 % — ABNORMAL HIGH (ref 11.5–15.5)
WBC: 6.6 K/uL (ref 4.0–10.5)
nRBC: 0.5 % — ABNORMAL HIGH (ref 0.0–0.2)

## 2024-07-19 LAB — RENAL FUNCTION PANEL
Albumin: 3.2 g/dL — ABNORMAL LOW (ref 3.5–5.0)
Anion gap: 6 (ref 5–15)
BUN: 33 mg/dL — ABNORMAL HIGH (ref 8–23)
CO2: 27 mmol/L (ref 22–32)
Calcium: 8.6 mg/dL — ABNORMAL LOW (ref 8.9–10.3)
Chloride: 108 mmol/L (ref 98–111)
Creatinine, Ser: 1.8 mg/dL — ABNORMAL HIGH (ref 0.44–1.00)
GFR, Estimated: 30 mL/min — ABNORMAL LOW (ref >60)
Glucose, Bld: 146 mg/dL — ABNORMAL HIGH (ref 70–99)
Phosphorus: 3.1 mg/dL (ref 2.5–4.6)
Potassium: 4.4 mmol/L (ref 3.5–5.1)
Sodium: 141 mmol/L (ref 135–145)

## 2024-07-19 LAB — GLUCOSE, CAPILLARY
Glucose-Capillary: 164 mg/dL — ABNORMAL HIGH (ref 70–99)
Glucose-Capillary: 203 mg/dL — ABNORMAL HIGH (ref 70–99)
Glucose-Capillary: 183 mg/dL — ABNORMAL HIGH (ref 70–99)
Glucose-Capillary: 269 mg/dL — ABNORMAL HIGH (ref 70–99)

## 2024-07-19 LAB — MAGNESIUM: Magnesium: 1.8 mg/dL (ref 1.7–2.4)

## 2024-07-19 MED ORDER — QUETIAPINE FUMARATE 50 MG PO TABS
50.0000 mg | ORAL_TABLET | Freq: Every day | ORAL | Status: DC
  Administered 2024-07-19 – 2024-07-20 (×2): 50 mg via ORAL
  Filled 2024-07-19: qty 2
  Filled 2024-07-19: qty 1

## 2024-07-19 NOTE — PMR Pre-admission (Shared)
 PMR Admission Coordinator Pre-Admission Assessment  Patient: Alison Whitehead is an 73 y.o., female MRN: [id number] DOB: [birth year] Height: 5' 6 (167.6 cm) Weight: 107.9 kg  Insurance Information HMO: ***    PPO: ***     PCP:      IPA:      80/20:      OTHER:  PRIMARY: [employer] Medicare      Policy#: [id number]       Subscriber: pt CM Name: ***      Phone#: ***     Fax#: *** Pre-Cert#: ***      Employer:  Benefits:  Phone #: ***     Name:  Eff. Date: ***     Deduct: ***      Out of Pocket Max: ***      Life Max:  CIR: ***      SNF: *** Outpatient: ***     Co-Pay: *** Home Health: ***      Co-Pay: *** DME: ***     Co-Pay: *** Providers:  SECONDARY: Medicaid of       Policy#: [id number]     Phone#: [phone]  Financial Counselor:       Phone#:   The Data Collection Information Summary for patients in Inpatient Rehabilitation Facilities with attached Privacy Act Statement-Health Care Records was provided and verbally reviewed with: Patient  Emergency Contact Information Contact Information     Name Relation Home Work Mobile   Caldwell,Carolyn Daughter [phone]        Other Contacts   None on File     Current Medical History  Patient Admitting Diagnosis: debility   History of Present Illness: Pt is a 73 y/o female with PMH of CKD stage IIIb, HTN, R pontine CVA, L TKA who presented to [facility] on 07/12/24 with AMS.  In ED she was disoriented x2, abdomen distended, hypotensive, hypothermic to 90.  Labs notable for WBC 4.9, K >7.5, creatinine 16.15, BUN 187, Ca 7, AG 27, and UA showing small leukocytes.  She was started on levophed  and bicarb infusions and CRRT.  Her creatinine has returned to near baseline of 1.73.  Encephalopathy felt due to uremia and she is currently on seroquel  and weaning.  Therapy evaluations were completed and she was recommended for CIR due to functional/cognitive decline.     Patient's medical record from [facility] has been reviewed  by the rehabilitation admission coordinator and physician.  Past Medical History  Past Medical History:  Diagnosis Date   Allergy    seasonal   Arthritis    CKD (chronic kidney disease)    DDD (degenerative disc disease)    Degenerative disc disease, lumbar    Hypertension    IBS (irritable bowel syndrome)    Osteopenia    Osteoporosis    osteopenia   Stroke (HCC) 09/2019    Has the patient had major surgery during 100 days prior to admission? No  Family History   family history includes Breast cancer in her maternal aunt; Cancer in her sister; Diabetes in her mother and sister; Glaucoma in her mother; Hypertension in her mother and sister; Other in her father.  Current Medications Current Medications[1]  Patients Current Diet:  Diet Order             Diet regular Room service appropriate? Yes with Assist; Fluid consistency: Thin  Diet effective now                   Precautions / Restrictions  Precautions Precautions: Fall Precaution/Restrictions Comments: continued cognitive deficites secondary to AMS and AKI but overall improving Restrictions Weight Bearing Restrictions Per Provider Order: No   Has the patient had 2 or more falls or a fall with injury in the past year? No  Prior Activity Level Limited Community (1-2x/wk): mod I mobility/ADLs/community prior to admit, RW for mobility, driving  Prior Functional Level Self Care: Did the patient need help bathing, dressing, using the toilet or eating? Independent  Indoor Mobility: Did the patient need assistance with walking from room to room (with or without device)? Independent  Stairs: Did the patient need assistance with internal or external stairs (with or without device)? Independent  Functional Cognition: Did the patient need help planning regular tasks such as shopping or remembering to take medications? Independent  Patient Information Are you of Hispanic, Latino/a,or Spanish origin?: A. No, not of  Hispanic, Latino/a, or Spanish origin What is your race?: B. Black or African American Do you need or want an interpreter to communicate with a doctor or health care staff?: 0. No  Patient's Response To:  Health Literacy and Transportation Is the patient able to respond to health literacy and transportation needs?: Yes Health Literacy - How often do you need to have someone help you when you read instructions, pamphlets, or other written material from your doctor or pharmacy?: Never In the past 12 months, has lack of transportation kept you from medical appointments or from getting medications?: No In the past 12 months, has lack of transportation kept you from meetings, work, or from getting things needed for daily living?: No  Home Assistive Devices / Equipment Home Equipment: Agricultural Consultant (2 wheels), [employer] - single point  Prior Device Use: Indicate devices/aids used by the patient prior to current illness, exacerbation or injury? Walker  Current Functional Level Cognition  Orientation Level: Oriented X4    Extremity Assessment (includes Sensation/Coordination)  Upper Extremity Assessment: Generalized weakness, Right hand dominant  Lower Extremity Assessment: Defer to PT evaluation    ADLs  Overall ADL's : Needs assistance/impaired Eating/Feeding: Independent Grooming: Wash/dry hands, Wash/dry face, Set up, Supervision/safety, Sitting Upper Body Bathing: Set up, Supervision/ safety, Sitting Lower Body Bathing: Moderate assistance Upper Body Dressing : Set up, Supervision/safety, Sitting Lower Body Dressing: Maximal assistance Toilet Transfer: Moderate assistance, Minimal assistance, Rolling walker (2 wheels), Stand-pivot, Cueing for safety, Cueing for sequencing Toileting- Clothing Manipulation and Hygiene: Moderate assistance, Sit to/from stand Functional mobility during ADLs: Moderate assistance, Minimal assistance, Rollator (4 wheels), Cueing for safety General ADL Comments:  no c/o dizziness    Mobility  Overal bed mobility: Needs Assistance Bed Mobility: Sit to Supine, Supine to Sit Supine to sit: Contact guard Sit to supine: Contact guard assist General bed mobility comments: pt in chair    Transfers  Overall transfer level: Needs assistance Equipment used: Rolling walker (2 wheels) Transfers: Sit to/from Stand Sit to Stand: Mod assist General transfer comment: heavy mod A with 2 trials STS from chair to RW for SPTs min A. No c/o dizziness; tpt does reports that she does experience dizziness sometimes during standing    Ambulation / Gait / Stairs / Engineer, Drilling / Balance Balance Overall balance assessment: Needs assistance Sitting-balance support: Feet supported Sitting balance-Leahy Scale: Good Standing balance support: Bilateral upper extremity supported, During functional activity Standing balance-Leahy Scale: Poor Standing balance comment: requires support on RW in standing, did not assess dynamic standing balance    Special considerations/life events  Diabetic management yes   Previous Home Environment (from acute therapy documentation) Living Arrangements: Alone Available Help at Discharge: Family, Available PRN/intermittently Type of Home: Apartment Home Layout: One level Home Access: Level entry Bathroom Shower/Tub: Engineer, Manufacturing Systems: Standard Home Care Services: No Additional Comments: prior to admission, she was IND with ADLs, IADLs, driving and using a RW for mobility  Discharge Living Setting Plans for Discharge Living Setting: Patient's home, Alone Type of Home at Discharge: Apartment Discharge Home Layout: One level Discharge Home Access: Level entry Discharge Bathroom Shower/Tub: Tub/shower unit Discharge Bathroom Toilet: Standard Discharge Bathroom Accessibility: Yes How Accessible: Accessible via walker Does the patient have any problems obtaining your medications?:  No  Social/Family/Support Systems Anticipated Caregiver: PRN [name] Mau (daughter) Anticipated Caregiver's Contact Information: [phone] Ability/Limitations of Caregiver: works FT and available PRN (as previous admission to CIR in 2021) Caregiver Availability: Intermittent Discharge Plan Discussed with Primary Caregiver: Yes Is Caregiver In Agreement with Plan?: Yes Does Caregiver/Family have Issues with Lodging/Transportation while Pt is in Rehab?: No  Goals Patient/Family Goal for Rehab: PT/OT/SLP supervision to mod I Expected length of stay: 8-11 days Additional Information: Discharge plan: return to pts first floor apartment with PRN support from daughter Pt/Family Agrees to Admission and willing to participate: Yes Program Orientation Provided & Reviewed with Pt/Caregiver Including Roles  & Responsibilities: Yes Additional Information Needs: on CIR in 2021, left mod I  Decrease burden of Care through IP rehab admission: n/a  Possible need for SNF placement upon discharge: No anticipated.  Plan for discharge back to pt's apartment with PRN assist from family.  Expect she will reach mod I level.   Patient Condition: This patient's condition remains as documented in the consult dated 07/18/24, in which the Rehabilitation Physician determined and documented that the patient's condition is appropriate for intensive rehabilitative care in an inpatient rehabilitation facility. Will admit to inpatient rehab ***.  Preadmission Screen Completed By:  Reche FORBES Lowers, 07/19/2024 10:50 AM ______________________________________________________________________   Discussed status with Dr. PIERRETTE on *** at *** and received approval for admission today.  Admission Coordinator:  Loleta Frommelt E Tarique Loveall, PT, time PIERRETTEPattricia ***   Assessment/Plan: Diagnosis: *** Does the need for close, 24 hr/day Medical supervision in concert with the patient's rehab needs make it unreasonable for this patient to be  served in a less intensive setting? {yes_no_potentially:3041433} Co-Morbidities requiring supervision/potential complications: *** Due to {due un:6958565}, does the patient require 24 hr/day rehab nursing? {yes_no_potentially:3041433} Does the patient require coordinated care of a physician, rehab nurse, PT, OT, and SLP to address physical and functional deficits in the context of the above medical diagnosis(es)? {yes_no_potentially:3041433} Addressing deficits in the following areas: {deficits:3041436} Can the patient actively participate in an intensive therapy program of at least 3 hrs of therapy 5 days a week? {yes_no_potentially:3041433} The potential for patient to make measurable gains while on inpatient rehab is {potential:3041437} Anticipated functional outcomes upon discharge from inpatient rehab: {functional outcomes:304600100} PT, {functional outcomes:304600100} OT, {functional outcomes:304600100} SLP Estimated rehab length of stay to reach the above functional goals is: *** Anticipated discharge destination: {anticipated dc setting:21604} 10. Overall Rehab/Functional Prognosis: {potential:3041437}   MD Signature: ***    [1]  Current Facility-Administered Medications:    acetaminophen  (TYLENOL ) tablet 650 mg, 650 mg, Oral, Q6H PRN, Vann, Jessica U, DO   amLODipine  (NORVASC ) tablet 10 mg, 10 mg, Oral, Daily, Vann, Jessica U, DO, 10 mg at 07/19/24 0909   aspirin  EC tablet 81 mg, 81 mg, Oral, Daily, Millen, Jessica  B, RPH, 81 mg at 07/19/24 0909   atorvastatin  (LIPITOR) tablet 40 mg, 40 mg, Oral, Daily, Autry, Lauren E, PA-C, 40 mg at 07/19/24 0909   carvedilol  (COREG ) tablet 6.25 mg, 6.25 mg, Oral, BID, Vann, Jessica U, DO, 6.25 mg at 07/19/24 0909   Chlorhexidine  Gluconate Cloth 2 % PADS 6 each, 6 each, Topical, Daily, Payne, John D, PA-C, 6 each at 07/17/24 1000   docusate sodium  (COLACE) capsule 100 mg, 100 mg, Oral, BID PRN, Payne, John D, PA-C   feeding supplement (ENSURE  PLUS HIGH PROTEIN) liquid 237 mL, 237 mL, Oral, BID BM, Desai, Nikita S, MD, 237 mL at 07/19/24 9082   ferrous sulfate  tablet 325 mg, 325 mg, Oral, Once per day on Monday Wednesday Friday, Adolph, Lauren E, PA-C, 325 mg at 07/18/24 0848   heparin  injection 5,000 Units, 5,000 Units, Subcutaneous, Q8H, Payne, John D, PA-C, 5,000 Units at 07/19/24 0545   HYDROcodone -acetaminophen  (NORCO) 10-325 MG per tablet 1 tablet, 1 tablet, Oral, Q6H PRN, Vann, Jessica U, DO, 1 tablet at 07/19/24 0545   insulin  aspart (novoLOG ) injection 0-15 Units, 0-15 Units, Subcutaneous, TID WC, Merilee Linsey I, RPH, 3 Units at 07/19/24 0900   lip balm (CARMEX) ointment, , Topical, PRN, Ogan, Okoronkwo U, MD, 1 Application at 07/13/24 0512   melatonin tablet 3 mg, 3 mg, Oral, QHS PRN, Chand, Sudham, MD, 3 mg at 07/18/24 2109   multivitamin (RENA-VIT) tablet 1 tablet, 1 tablet, Oral, QHS, Millen, Jessica B, RPH, 1 tablet at 07/18/24 2108   pantoprazole  (PROTONIX ) EC tablet 40 mg, 40 mg, Oral, Daily, Aventura, Emily T, MD, 40 mg at 07/19/24 9090   polyethylene glycol (MIRALAX  / GLYCOLAX ) packet 17 g, 17 g, Oral, Daily PRN, Payne, John D, PA-C   QUEtiapine  (SEROQUEL ) tablet 100 mg, 100 mg, Oral, QHS, Autry, Lauren E, PA-C, 100 mg at 07/18/24 2108   sodium chloride  flush (NS) 0.9 % injection 10-40 mL, 10-40 mL, Intracatheter, PRN, Desai, Nikita S, MD

## 2024-07-19 NOTE — TOC Progression Note (Signed)
 Transition of Care [facility]) - Progression Note    Patient Details  Name: Alison Whitehead MRN: [id number] Date of Birth: 1951/04/16  [facility] CM/SW Contact  Graves-Bigelow, Erminio Deems, RN Phone Number: 07/19/2024, 2:22 PM  Clinical Narrative: Patient was discussed in progression rounds. Patient transferred from [place]. PT/OT recommendations are for CIR.  Per Inpatient Rehab Admissions Coordinator insurance authorization is pending. ICM will continue to follow for additional needs as the patient progresses.  Expected Discharge Plan: IP Rehab Facility Barriers to Discharge: Continued Medical Work up  Expected Discharge Plan and Services In-house Referral: NA Discharge Planning Services: CM Consult Post Acute Care Choice: IP Rehab Living arrangements for the past 2 months: Apartment  Social Drivers of Health (SDOH) Interventions SDOH Screenings   Food Insecurity: No Food Insecurity (07/18/2024)  Housing: Low Risk (07/18/2024)  Transportation Needs: No Transportation Needs (07/18/2024)  Utilities: Not At Risk (07/18/2024)  Depression (PHQ2-9): Low Risk (07/02/2022)  Social Connections: Moderately Isolated (07/18/2024)  Tobacco Use: Medium Risk (07/18/2024)   Readmission Risk Interventions    12/28/2022    1:24 PM  Readmission Risk Prevention Plan  Post Dischage Appt Complete  Medication Screening Not Complete  Medication Screening Not Complete Comment N/A  Transportation Screening Not Complete  Transportation Screening Comment N/A

## 2024-07-19 NOTE — Progress Notes (Signed)
 Inpatient Rehab Admissions Coordinator:   Insurance auth pending.  I updated pt's daughter on the phone this morning for expectation 8-11 days on rehab with goals of intermittent assist at home and likely f/u with HHPT/OT at discharge from CIR.  Will continue to follow.   Reche Lowers, PT, DPT Admissions Coordinator [phone] 07/19/2024 11:17 AM

## 2024-07-19 NOTE — Plan of Care (Signed)
°  Problem: Education: Goal: Knowledge of General Education information will improve Description: Including pain rating scale, medication(s)/side effects and non-pharmacologic comfort measures Outcome: Progressing   Problem: Health Behavior/Discharge Planning: Goal: Ability to manage health-related needs will improve Outcome: Progressing   Problem: Clinical Measurements: Goal: Ability to maintain clinical measurements within normal limits will improve Outcome: Progressing   Problem: Clinical Measurements: Goal: Will remain free from infection Outcome: Progressing   Problem: Clinical Measurements: Goal: Diagnostic test results will improve Outcome: Progressing   Problem: Clinical Measurements: Goal: Will remain free from infection Outcome: Progressing   Problem: Pain Managment: Goal: General experience of comfort will improve and/or be controlled Outcome: Progressing   Problem: Skin Integrity: Goal: Risk for impaired skin integrity will decrease Outcome: Progressing

## 2024-07-19 NOTE — Progress Notes (Signed)
 PROGRESS NOTE    Alison Whitehead  FMW:[id number] DOB: Oct 09, 1950 DOA: 07/12/2024 PCP: Bridgette Sluder, PA    Brief Narrative:  Patient is a 73 yo F w/ pertinent PMH CKD 3b, HTN, HLD, prediabetes presents to [facility] on 12/9 with AMS.  Found to have acute kidney injury and underwent CRRT and bicarb drip.  Patient's creatinine seems to be back to baseline her mental status has improved but not back to baseline.  Current plan is to be evaluated for discharge to CIR   Assessment and Plan:  AKI on CKD 3B, likely due to ischemic ATN from hypovolemic shock Hypocalcemia Patient presented with creatinine 16.1 on admission, her baseline serum creatinine 1.73 with GFR 02 Jan 2023 Remain off CRRT and nephrology has signed off Serum creatinine at baseline - Foley removed   Shock, likely hypovolemic, resolved Sepsis was ruled out Cultures remain negative Patient is off vasopressors White count remained within normal limits   Acute encephalopathy, likely due to uremia -Appear back to baseline - Will slowly wean off Seroquel  that was started in the ICU  Low phosphorus - Repleted  Thrombocytopenia, unclear etiology -Resolved   HTN/HLD Continue statin - Start to resume blood pressure meds as tolerates   Prediabetes Patient's hemoglobin A1c is 5.8 Continue sliding scale insulin  with CBG goal 140-180   Anemia of chronic disease -no signs of active bleeding Monitor H&H and transfuse if less than 7   Obesity Estimated body mass index is 38.39 kg/m as calculated from the following:   Height as of this encounter: 5' 6 (1.676 m).   Weight as of this encounter: 107.9 kg.    Foreign body in colon: - Discussed with PCCM-does not appear to be causing her issues and should pass on own  PT EVAL-CIR   DVT prophylaxis: heparin  injection 5,000 Units Start: 07/13/24 0600    Code Status: Full Code   Disposition Plan:  Level of care: Telemetry Status is: Inpatient     Consultants:   Renal PCCM   Subjective: No complaints today, says she slept well  Objective: Vitals:   07/18/24 2345 07/19/24 0345 07/19/24 0744 07/19/24 0908  BP: 132/85 102/69 105/68 (!) 127/98  Pulse: 90 89 97   Resp: 12 14 19    Temp: 98 F (36.7 C) 97.6 F (36.4 C) 97.8 F (36.6 C)   TempSrc: Oral Oral Oral   SpO2:   98%   Weight:  107.9 kg    Height:        Intake/Output Summary (Last 24 hours) at 07/19/2024 1107 Last data filed at 07/18/2024 1726 Gross per 24 hour  Intake --  Output 350 ml  Net -350 ml   Filed Weights   07/17/24 0600 07/18/24 0356 07/19/24 0345  Weight: 104.6 kg 104 kg 107.9 kg    Examination:   General: Appearance:    Obese female in no acute distress     Lungs:     respirations unlabored  Heart:    Normal heart rate. .    MS:   All extremities are intact.    Neurologic:   Awake, alert       Data Reviewed: I have personally reviewed following labs and imaging studies  CBC: Recent Labs  Lab 07/12/24 1930 07/12/24 1942 07/15/24 0346 07/16/24 1520 07/17/24 0546 07/18/24 0545 07/19/24 0509  WBC 4.9   < > 6.2 7.9 7.6 6.9 6.6  NEUTROABS 3.9  --   --  4.4 4.1  --   --  HGB 9.3*   < > 7.4* 8.0* 8.0* 7.8* 7.7*  HCT 30.8*   < > 23.4* 26.4* 25.7* 25.7* 26.4*  MCV 90.1   < > 87.3 88.0 86.0 88.0 90.1  PLT 235   < > 128* 186 181 214 252   < > = values in this interval not displayed.   Basic Metabolic Panel: Recent Labs  Lab 07/13/24 0050 07/13/24 0255 07/14/24 0508 07/15/24 0346 07/15/24 1810 07/16/24 1520 07/17/24 0546 07/18/24 0545 07/19/24 0509  NA 138   < > 137 144 143 144 142 141 141  K 6.8*   < > 3.9 3.7 3.9 3.8 3.9 3.8 4.4  CL 98   < > 102 110 110 109 104 107 108  CO2 11*   < > 27 23 24 26 28 25 27   GLUCOSE 129*   < > 137* 179* 98 71 112* 155* 146*  BUN 176*   < > 54* 52* 44* 41* 28* 31* 33*  CREATININE 15.83*   < > 3.40* 2.55* 2.03* 1.73* 1.66* 1.78* 1.80*  CALCIUM  6.7*   < > 7.1* 7.5* 8.2* 9.3 9.0 8.7* 8.6*  MG 3.0*  --   2.2 2.3  --   --  1.8  --  1.8  PHOS >30.0*   < > 3.2 2.6 2.6 1.8* 1.7* 2.9 3.1   < > = values in this interval not displayed.   GFR: Estimated Creatinine Clearance: 35.1 mL/min (A) (by C-G formula based on SCr of 1.8 mg/dL (H)). Liver Function Tests: Recent Labs  Lab 07/12/24 1930 07/13/24 0050 07/13/24 0945 07/15/24 1810 07/16/24 1520 07/17/24 0546 07/18/24 0545 07/19/24 0509  AST 20 16  --   --   --   --   --   --   ALT 14 13  --   --   --   --   --   --   ALKPHOS 67 62  --   --   --   --   --   --   BILITOT 0.7 0.8  --   --   --   --   --   --   PROT 6.6 6.5  --   --   --   --   --   --   ALBUMIN  3.3* 3.3*   < > 3.5 3.5 3.3* 3.1* 3.2*   < > = values in this interval not displayed.   Recent Labs  Lab 07/12/24 1930  LIPASE 46   Recent Labs  Lab 07/13/24 0255  AMMONIA 38*   Coagulation Profile: Recent Labs  Lab 07/12/24 1930  INR 1.2   Cardiac Enzymes: No results for input(s): CKTOTAL, CKMB, CKMBINDEX, TROPONINI in the last 168 hours. BNP (last 3 results) No results for input(s): PROBNP in the last 8760 hours. HbA1C: No results for input(s): HGBA1C in the last 72 hours. CBG: Recent Labs  Lab 07/18/24 0759 07/18/24 1145 07/18/24 1638 07/18/24 2118 07/19/24 0747  GLUCAP 211* 152* 180* 173* 164*   Lipid Profile: No results for input(s): CHOL, HDL, LDLCALC, TRIG, CHOLHDL, LDLDIRECT in the last 72 hours. Thyroid Function Tests: No results for input(s): TSH, T4TOTAL, FREET4, T3FREE, THYROIDAB in the last 72 hours. Anemia Panel: No results for input(s): VITAMINB12, FOLATE, FERRITIN, TIBC, IRON, RETICCTPCT in the last 72 hours.  Sepsis Labs: Recent Labs  Lab 07/12/24 1942 07/12/24 2213 07/12/24 2219 07/15/24 1150  PROCALCITON  --  0.31  --  0.17  LATICACIDVEN 0.8  --  1.6  --     Recent Results (from the past 240 hours)  Blood Culture (routine x 2)     Status: None   Collection Time: 07/12/24  7:29 PM    Specimen: BLOOD RIGHT FOREARM  Result Value Ref Range Status   Specimen Description BLOOD RIGHT FOREARM  Final   Special Requests   Final    BOTTLES DRAWN AEROBIC AND ANAEROBIC Blood Culture adequate volume   Culture   Final    NO GROWTH 5 DAYS Performed at [facility], [address] [place], [place] [address]    Report Status 07/17/2024 FINAL  Final  Blood Culture (routine x 2)     Status: None   Collection Time: 07/12/24  7:29 PM   Specimen: BLOOD LEFT FOREARM  Result Value Ref Range Status   Specimen Description BLOOD LEFT FOREARM  Final   Special Requests   Final    BOTTLES DRAWN AEROBIC AND ANAEROBIC Blood Culture results may not be optimal due to an inadequate volume of blood received in culture bottles   Culture   Final    NO GROWTH 5 DAYS Performed at [facility], [address], [place], [place] [address]    Report Status 07/17/2024 FINAL  Final  Resp panel by RT-PCR (RSV, Flu A&B, Covid) Anterior Nasal Swab     Status: None   Collection Time: 07/12/24  7:29 PM   Specimen: Anterior Nasal Swab  Result Value Ref Range Status   SARS Coronavirus 2 by RT PCR NEGATIVE NEGATIVE Final   Influenza A by PCR NEGATIVE NEGATIVE Final   Influenza B by PCR NEGATIVE NEGATIVE Final    Comment: (NOTE) The Xpert Xpress SARS-CoV-2/FLU/RSV plus assay is intended as an aid in the diagnosis of influenza from Nasopharyngeal swab specimens and should not be used as a sole basis for treatment. Nasal washings and aspirates are unacceptable for Xpert Xpress SARS-CoV-2/FLU/RSV testing.  Fact Sheet for Patients: bloggercourse.com  Fact Sheet for Healthcare Providers: seriousbroker.it  This test is not yet approved or cleared by the [place]  FDA and has been authorized for detection and/or diagnosis of SARS-CoV-2 by FDA under an Emergency Use Authorization (EUA). This EUA will remain in effect (meaning this test can be  used) for the duration of the COVID-19 declaration under Section 564(b)(1) of the Act, 21 U.S.C. section 360bbb-3(b)(1), unless the authorization is terminated or revoked.     Resp Syncytial Virus by PCR NEGATIVE NEGATIVE Final    Comment: (NOTE) Fact Sheet for Patients: bloggercourse.com  Fact Sheet for Healthcare Providers: seriousbroker.it  This test is not yet approved or cleared by the [place]  FDA and has been authorized for detection and/or diagnosis of SARS-CoV-2 by FDA under an Emergency Use Authorization (EUA). This EUA will remain in effect (meaning this test can be used) for the duration of the COVID-19 declaration under Section 564(b)(1) of the Act, 21 U.S.C. section 360bbb-3(b)(1), unless the authorization is terminated or revoked.  Performed at [facility], [address], [place], [place] [address]   MRSA Next Gen by PCR, Nasal     Status: Abnormal   Collection Time: 07/12/24 10:28 PM   Specimen: Nasal Mucosa; Nasal Swab  Result Value Ref Range Status   MRSA by PCR Next Gen DETECTED (A) NOT DETECTED Final    Comment: CRITICAL RESULT CALLED TO, READ BACK BY AND VERIFIED WITH:  MEJIA RN 07/13/2024 @ 0204 by DD (NOTE) The GeneXpert MRSA Assay (FDA  approved for NASAL specimens only), is one component of a comprehensive MRSA colonization surveillance program. It is not intended to diagnose MRSA infection nor to guide or monitor treatment for MRSA infections. Test performance is not FDA approved in patients less than 66 years old. Performed at [facility], [address], [place], [place] [address]   Urine Culture (for pregnant, neutropenic or urologic patients or patients with an indwelling urinary catheter)     Status: None   Collection Time: 07/13/24  2:55 AM   Specimen: Urine, Clean Catch  Result Value Ref Range Status   Specimen Description URINE, CLEAN CATCH  Final   Special Requests NONE   Final   Culture   Final    NO GROWTH Performed at [facility], [address], [place], [place] [address]    Report Status 07/14/2024 FINAL  Final         Radiology Studies: No results found.      Scheduled Meds:  amLODipine   10 mg Oral Daily   aspirin  EC  81 mg Oral Daily   atorvastatin   40 mg Oral Daily   carvedilol   6.25 mg Oral BID   Chlorhexidine  Gluconate Cloth  6 each Topical Daily   feeding supplement  237 mL Oral BID BM   ferrous sulfate   325 mg Oral Once per day on Monday Wednesday Friday   heparin   5,000 Units Subcutaneous Q8H   insulin  aspart  0-15 Units Subcutaneous TID WC   multivitamin  1 tablet Oral QHS   pantoprazole   40 mg Oral Daily   QUEtiapine   100 mg Oral QHS   Continuous Infusions:     LOS: 7 days    Time spent: 45 minutes spent on chart review, discussion with nursing staff, consultants, updating family and interview/physical exam; more than 50% of that time was spent in counseling and/or coordination of care.    Harlene RAYMOND Bowl, DO [facility] Available via Epic secure chat 7am-7pm After these hours, please refer to coverage provider listed on [email or web address] 07/19/2024, 11:07 AM

## 2024-07-19 NOTE — Progress Notes (Signed)
 Physical Therapy Treatment Patient Details Name: Cachet Mccutchen MRN: [id number] DOB: 1951-04-25 Today's Date: 07/19/2024   History of Present Illness Patient is a 73y.o. female admitted on 07/12/24 after being found with AMS by her daughter. Her PMH includes: CKD stage 3, HTN, HLD, and prediabetes. She was found to have AKI requiring CRRT and bicarb drip.    PT Comments  Pt up in chair on arrival, pleasant and agreeable to session and demonstrating good progress towards acute goals. Pt progressing ambulation this session with RW for support and light min A to maintain balance as pt with LE weakness and increased fatigue. Pt demonstrating transfers sit<>stand with fair recall for hand placement with up to min A to boost and steady on rise. Pt without c/o dizziness/lightheadedness this session. Pt motivated for progress and will benefit from intensive inpatient follow-up therapy, >3 hours/day. Will continue to follow acutely.     If plan is discharge home, recommend the following: A little help with walking and/or transfers;A little help with bathing/dressing/bathroom   Can travel by private vehicle        Equipment Recommendations  None recommended by PT;Other (comment) (defer to next level of care)    Recommendations for Other Services       Precautions / Restrictions Precautions Precautions: Fall Recall of Precautions/Restrictions: Impaired Restrictions Weight Bearing Restrictions Per Provider Order: No     Mobility  Bed Mobility Overal bed mobility: Needs Assistance             General bed mobility comments: pt up in chair on arrival    Transfers Overall transfer level: Needs assistance Equipment used: Rolling walker (2 wheels) Transfers: Sit to/from Stand Sit to Stand: Min assist           General transfer comment: min A to rise from low recliner with cues for hand placement, CHA from low commode with bil UE use on rials     Ambulation/Gait Ambulation/Gait assistance: Min assist Gait Distance (Feet): 22 Feet (x2) Assistive device: Rolling walker (2 wheels) Gait Pattern/deviations: Step-through pattern, Trunk flexed, Wide base of support Gait velocity: decr     General Gait Details: pt ambualting with wide BOS with trunk flexed over RW, cues for upright trunk with pt able to correct, no overt LOB noted   Stairs             Wheelchair Mobility     Tilt Bed    Modified Rankin (Stroke Patients Only)       Balance Overall balance assessment: Needs assistance Sitting-balance support: Feet supported Sitting balance-Leahy Scale: Good     Standing balance support: Bilateral upper extremity supported, During functional activity Standing balance-Leahy Scale: Poor Standing balance comment: requires support on RW in standing                            Communication Communication Communication: No apparent difficulties  Cognition Arousal: Alert Behavior During Therapy: WFL for tasks assessed/performed   PT - Cognitive impairments: Safety/Judgement, Memory, Problem solving, Orientation                         Following commands: Intact      Cueing Cueing Techniques: Verbal cues, Gestural cues, Tactile cues  Exercises      General Comments General comments (skin integrity, edema, etc.): VSS on RA, pt with c/o dizzniess      Pertinent Vitals/Pain Pain Assessment Pain Assessment: No/denies  pain    Home Living                          Prior Function            PT Goals (current goals can now be found in the care plan section) Acute Rehab PT Goals Patient Stated Goal: get my strength back PT Goal Formulation: With patient/family Time For Goal Achievement: 07/31/24 Progress towards PT goals: Progressing toward goals    Frequency    Min 3X/week      PT Plan      Co-evaluation              AM-PAC PT 6 Clicks Mobility   Outcome  Measure  Help needed turning from your back to your side while in a flat bed without using bedrails?: A Little Help needed moving from lying on your back to sitting on the side of a flat bed without using bedrails?: A Little Help needed moving to and from a bed to a chair (including a wheelchair)?: A Little Help needed standing up from a chair using your arms (e.g., wheelchair or bedside chair)?: A Little Help needed to walk in hospital room?: A Lot Help needed climbing 3-5 steps with a railing? : Total 6 Click Score: 15    End of Session Equipment Utilized During Treatment: Gait belt Activity Tolerance: Patient tolerated treatment well Patient left: with call bell/phone within reach;with family/visitor present;in chair Nurse Communication: Mobility status PT Visit Diagnosis: Unsteadiness on feet (R26.81);Difficulty in walking, not elsewhere classified (R26.2);Muscle weakness (generalized) (M62.81)     Time: 8861-8844 PT Time Calculation (min) (ACUTE ONLY): 17 min  Charges:    $Gait Training: 8-22 mins PT General Charges $$ ACUTE PT VISIT: 1 Visit                     Shacora Zynda R. PTA Acute Rehabilitation Services Office: [phone]   Therisa CHRISTELLA Boor 07/19/2024, 12:51 PM

## 2024-07-20 ENCOUNTER — Other Ambulatory Visit (HOSPITAL_COMMUNITY): Payer: Self-pay

## 2024-07-20 DIAGNOSIS — N179 Acute kidney failure, unspecified: Secondary | ICD-10-CM | POA: Diagnosis not present

## 2024-07-20 LAB — RENAL FUNCTION PANEL
Albumin: 3.7 g/dL (ref 3.5–5.0)
Anion gap: 7 (ref 5–15)
BUN: 34 mg/dL — ABNORMAL HIGH (ref 8–23)
CO2: 26 mmol/L (ref 22–32)
Calcium: 9.1 mg/dL (ref 8.9–10.3)
Chloride: 108 mmol/L (ref 98–111)
Creatinine, Ser: 1.56 mg/dL — ABNORMAL HIGH (ref 0.44–1.00)
GFR, Estimated: 35 mL/min — ABNORMAL LOW (ref >60)
Glucose, Bld: 225 mg/dL — ABNORMAL HIGH (ref 70–99)
Phosphorus: 2.4 mg/dL — ABNORMAL LOW (ref 2.5–4.6)
Potassium: 4.8 mmol/L (ref 3.5–5.1)
Sodium: 141 mmol/L (ref 135–145)

## 2024-07-20 LAB — GLUCOSE, CAPILLARY
Glucose-Capillary: 192 mg/dL — ABNORMAL HIGH (ref 70–99)
Glucose-Capillary: 263 mg/dL — ABNORMAL HIGH (ref 70–99)
Glucose-Capillary: 238 mg/dL — ABNORMAL HIGH (ref 70–99)
Glucose-Capillary: 163 mg/dL — ABNORMAL HIGH (ref 70–99)

## 2024-07-20 LAB — MAGNESIUM: Magnesium: 1.9 mg/dL (ref 1.7–2.4)

## 2024-07-20 MED ORDER — PANCRELIPASE (LIP-PROT-AMYL) 36000-114000 UNITS PO CPEP
36000.0000 [IU] | ORAL_CAPSULE | Freq: Two times a day (BID) | ORAL | Status: DC
  Administered 2024-07-20 – 2024-07-26 (×13): 36000 [IU] via ORAL
  Filled 2024-07-20 (×13): qty 1

## 2024-07-20 MED ORDER — DICYCLOMINE HCL 10 MG PO CAPS
10.0000 mg | ORAL_CAPSULE | Freq: Four times a day (QID) | ORAL | Status: DC | PRN
  Administered 2024-07-20 – 2024-07-25 (×4): 10 mg via ORAL
  Filled 2024-07-20 (×4): qty 1

## 2024-07-20 MED ORDER — GLUCERNA SHAKE PO LIQD
237.0000 mL | Freq: Three times a day (TID) | ORAL | Status: DC
  Administered 2024-07-20 – 2024-07-26 (×14): 237 mL via ORAL

## 2024-07-20 NOTE — Progress Notes (Signed)
 Occupational Therapy Treatment Patient Details Name: Alison Whitehead MRN: [id number] DOB: 1951-01-12 Today's Date: 07/20/2024   History of present illness Patient is a 73y.o. female admitted on 07/12/24 after being found with AMS by her daughter. Her PMH includes: CKD stage 3, HTN, HLD, and prediabetes. She was found to have AKI requiring CRRT and bicarb drip.   OT comments  Pt in bed upon therapy arrival and agreeable to participate in OT treatment session. Session focused on activity tolerance and endurance while completing functional mobility and ADL task in room. Pt able to stand at sink to complete grooming task and post toileting hygiene although demonstrated decreased core strength/stability while leaning on counter top. Pt walked from bed to bathroom then bathroom to recliner with use of RW and SBA. Patient will benefit from intensive inpatient follow-up therapy, >3 hours/day.       If plan is discharge home, recommend the following:  Assistance with cooking/housework;Assist for transportation;Help with stairs or ramp for entrance;A little help with walking and/or transfers;A little help with bathing/dressing/bathroom   Equipment Recommendations  Other (comment) (defer to next level of care)       Precautions / Restrictions Precautions Precautions: Fall Recall of Precautions/Restrictions: Intact Restrictions Weight Bearing Restrictions Per Provider Order: No       Mobility Bed Mobility Overal bed mobility: Needs Assistance Bed Mobility: Supine to Sit     Supine to sit: Supervision, HOB elevated     Transfers Overall transfer level: Needs assistance Equipment used: Rolling walker (2 wheels) Transfers: Sit to/from Stand, Bed to chair/wheelchair/BSC Sit to Stand: Supervision     Step pivot transfers: Supervision     General transfer comment: VC for safety awareness with RW management. Pt able to demonstrate understanding     Balance Overall balance  assessment: Needs assistance Sitting-balance support: Feet supported, No upper extremity supported Sitting balance-Leahy Scale: Good     Standing balance support: Bilateral upper extremity supported, During functional activity Standing balance-Leahy Scale: Poor Standing balance comment: requires some time of external support for balance while standing            ADL either performed or assessed with clinical judgement   ADL       Grooming: Wash/dry face;Standing;Wash/dry hands;Supervision/safety    Upper Body Dressing : Set up;Standing Upper Body Dressing Details (indicate cue type and reason): donned new hospital gown     Toilet Transfer: Contact guard assist;Ambulation;Regular Toilet;Rolling walker (2 wheels);Grab bars;Supervision/safety Toilet Transfer Details (indicate cue type and reason): toilet seat extremely low. CGA provided for stand to sit although pt able to use grab bars and stand up with SBA. Toileting- Clothing Manipulation and Hygiene: Supervision/safety;Sit to/from stand                 Communication Communication Communication: No apparent difficulties   Cognition Arousal: Alert Behavior During Therapy: WFL for tasks assessed/performed Cognition: No apparent impairments    OT - Cognition Comments: Able to follow all commands.      Following commands: Intact        Cueing   Cueing Techniques: Verbal cues        General Comments VSS on RA    Pertinent Vitals/ Pain       Pain Assessment Pain Assessment: No/denies pain         Frequency  Min 2X/week        Progress Toward Goals  OT Goals(current goals can now be found in the care plan section)  Progress towards OT  goals: Progressing toward goals      AM-PAC OT 6 Clicks Daily Activity     Outcome Measure   Help from another person eating meals?: None Help from another person taking care of personal grooming?: None Help from another person toileting, which includes using  toliet, bedpan, or urinal?: None Help from another person bathing (including washing, rinsing, drying)?: A Lot Help from another person to put on and taking off regular upper body clothing?: A Little Help from another person to put on and taking off regular lower body clothing?: A Lot 6 Click Score: 19    End of Session Equipment Utilized During Treatment: Rolling walker (2 wheels)  OT Visit Diagnosis: Unsteadiness on feet (R26.81);Other abnormalities of gait and mobility (R26.89);Muscle weakness (generalized) (M62.81)   Activity Tolerance Patient tolerated treatment well   Patient Left in chair;with call bell/phone within reach;with chair alarm set           Time: 8498-8475 OT Time Calculation (min): 23 min  Charges: OT General Charges $OT Visit: 1 Visit OT Treatments $Self Care/Home Management : 23-37 mins  Leita Howell, OTR/L,CBIS  Supplemental OT - [facility] and [facility] Secure Chat Preferred    Kenson Groh, Leita BIRCH 07/20/2024, 3:31 PM

## 2024-07-20 NOTE — Progress Notes (Signed)
 PROGRESS NOTE    Alison Whitehead  FMW:[id number] DOB: 12-11-1950 DOA: 07/12/2024 PCP: Bridgette Sluder, PA    Brief Narrative:  Patient is a 73 yo F w/ pertinent PMH CKD 3b, HTN, HLD, prediabetes presents to [facility] on 12/9 with AMS.  Found to have acute kidney injury and underwent CRRT and bicarb drip.  Patient's creatinine seems to be back to baseline her mental status has improved but not back to baseline.  Current plan is to be evaluated for discharge to CIR   Assessment and Plan:  AKI on CKD 3B, likely due to ischemic ATN from hypovolemic shock Hypocalcemia Patient presented with creatinine 16.1 on admission, her baseline serum creatinine 1.73 with GFR 02 Jan 2023 Remain off CRRT and nephrology has signed off Serum creatinine at baseline - Foley removed   Shock, likely hypovolemic, resolved Sepsis was ruled out Cultures remain negative Patient is off vasopressors White count remained within normal limits   Acute encephalopathy, likely due to uremia -Appear back to baseline - Will slowly wean off Seroquel  that was started in the ICU  Low phosphorus - Repleted  Thrombocytopenia, unclear etiology -Resolved   HTN/HLD Continue statin - Start to resume blood pressure meds as tolerates   Prediabetes Patient's hemoglobin A1c is 5.8 Continue sliding scale insulin  with CBG goal 140-180   Anemia of chronic disease -no signs of active bleeding Monitor H&H and transfuse if less than 7   Obesity Estimated body mass index is 38.32 kg/m as calculated from the following:   Height as of this encounter: 5' 6 (1.676 m).   Weight as of this encounter: 107.7 kg.    Foreign body in colon: - Discussed with PCCM-does not appear to be causing her issues and should pass on own  PT EVAL-CIR   DVT prophylaxis: heparin  injection 5,000 Units Start: 07/13/24 0600    Code Status: Full Code   Disposition Plan:  Level of care: Telemetry Status is: Inpatient     Consultants:   Renal PCCM   Subjective: Again, slept well last night and saw beautiful sunrise this morning with pink sky  Objective: Vitals:   07/19/24 1940 07/19/24 2358 07/20/24 0444 07/20/24 0821  BP: 113/74 112/80 114/70 114/70  Pulse: 94 85 78 97  Resp: 19 16 17  (!) 22  Temp: 98.8 F (37.1 C) 98.7 F (37.1 C) 98.7 F (37.1 C) 98.3 F (36.8 C)  TempSrc: Oral Oral Oral Oral  SpO2: 99% 100% 100% 98%  Weight:   107.7 kg   Height:        Intake/Output Summary (Last 24 hours) at 07/20/2024 1148 Last data filed at 07/20/2024 0933 Gross per 24 hour  Intake 240 ml  Output 850 ml  Net -610 ml   Filed Weights   07/18/24 0356 07/19/24 0345 07/20/24 0444  Weight: 104 kg 107.9 kg 107.7 kg    Examination:   General: Appearance:    Obese female in no acute distress     Lungs:     respirations unlabored  Heart:    Normal heart rate. .    MS:   All extremities are intact.    Neurologic:   Awake, alert       Data Reviewed: I have personally reviewed following labs and imaging studies  CBC: Recent Labs  Lab 07/15/24 0346 07/16/24 1520 07/17/24 0546 07/18/24 0545 07/19/24 0509  WBC 6.2 7.9 7.6 6.9 6.6  NEUTROABS  --  4.4 4.1  --   --   HGB 7.4*  8.0* 8.0* 7.8* 7.7*  HCT 23.4* 26.4* 25.7* 25.7* 26.4*  MCV 87.3 88.0 86.0 88.0 90.1  PLT 128* 186 181 214 252   Basic Metabolic Panel: Recent Labs  Lab 07/14/24 0508 07/15/24 0346 07/15/24 1810 07/16/24 1520 07/17/24 0546 07/18/24 0545 07/19/24 0509 07/20/24 0530  NA 137 144   < > 144 142 141 141 141  K 3.9 3.7   < > 3.8 3.9 3.8 4.4 4.8  CL 102 110   < > 109 104 107 108 108  CO2 27 23   < > 26 28 25 27 26   GLUCOSE 137* 179*   < > 71 112* 155* 146* 225*  BUN 54* 52*   < > 41* 28* 31* 33* 34*  CREATININE 3.40* 2.55*   < > 1.73* 1.66* 1.78* 1.80* 1.56*  CALCIUM  7.1* 7.5*   < > 9.3 9.0 8.7* 8.6* 9.1  MG 2.2 2.3  --   --  1.8  --  1.8 1.9  PHOS 3.2 2.6   < > 1.8* 1.7* 2.9 3.1 2.4*   < > = values in this interval not  displayed.   GFR: Estimated Creatinine Clearance: 40.5 mL/min (A) (by C-G formula based on SCr of 1.56 mg/dL (H)). Liver Function Tests: Recent Labs  Lab 07/16/24 1520 07/17/24 0546 07/18/24 0545 07/19/24 0509 07/20/24 0530  ALBUMIN  3.5 3.3* 3.1* 3.2* 3.7   No results for input(s): LIPASE, AMYLASE in the last 168 hours.  No results for input(s): AMMONIA in the last 168 hours.  Coagulation Profile: No results for input(s): INR, PROTIME in the last 168 hours.  Cardiac Enzymes: No results for input(s): CKTOTAL, CKMB, CKMBINDEX, TROPONINI in the last 168 hours. BNP (last 3 results) No results for input(s): PROBNP in the last 8760 hours. HbA1C: No results for input(s): HGBA1C in the last 72 hours. CBG: Recent Labs  Lab 07/19/24 0747 07/19/24 1210 07/19/24 1618 07/19/24 2211 07/20/24 0819  GLUCAP 164* 203* 183* 269* 192*   Lipid Profile: No results for input(s): CHOL, HDL, LDLCALC, TRIG, CHOLHDL, LDLDIRECT in the last 72 hours. Thyroid Function Tests: No results for input(s): TSH, T4TOTAL, FREET4, T3FREE, THYROIDAB in the last 72 hours. Anemia Panel: No results for input(s): VITAMINB12, FOLATE, FERRITIN, TIBC, IRON, RETICCTPCT in the last 72 hours.  Sepsis Labs: Recent Labs  Lab 07/15/24 1150  PROCALCITON 0.17    Recent Results (from the past 240 hours)  Blood Culture (routine x 2)     Status: None   Collection Time: 07/12/24  7:29 PM   Specimen: BLOOD RIGHT FOREARM  Result Value Ref Range Status   Specimen Description BLOOD RIGHT FOREARM  Final   Special Requests   Final    BOTTLES DRAWN AEROBIC AND ANAEROBIC Blood Culture adequate volume   Culture   Final    NO GROWTH 5 DAYS Performed at [facility], [address], [place], [place] [address]    Report Status 07/17/2024 FINAL  Final  Blood Culture (routine x 2)     Status: None   Collection Time: 07/12/24  7:29 PM   Specimen: BLOOD LEFT  FOREARM  Result Value Ref Range Status   Specimen Description BLOOD LEFT FOREARM  Final   Special Requests   Final    BOTTLES DRAWN AEROBIC AND ANAEROBIC Blood Culture results may not be optimal due to an inadequate volume of blood received in culture bottles   Culture   Final    NO GROWTH 5 DAYS Performed at [facility]  [facility], [address], [place], [place] [address]    Report Status 07/17/2024 FINAL  Final  Resp panel by RT-PCR (RSV, Flu A&B, Covid) Anterior Nasal Swab     Status: None   Collection Time: 07/12/24  7:29 PM   Specimen: Anterior Nasal Swab  Result Value Ref Range Status   SARS Coronavirus 2 by RT PCR NEGATIVE NEGATIVE Final   Influenza A by PCR NEGATIVE NEGATIVE Final   Influenza B by PCR NEGATIVE NEGATIVE Final    Comment: (NOTE) The Xpert Xpress SARS-CoV-2/FLU/RSV plus assay is intended as an aid in the diagnosis of influenza from Nasopharyngeal swab specimens and should not be used as a sole basis for treatment. Nasal washings and aspirates are unacceptable for Xpert Xpress SARS-CoV-2/FLU/RSV testing.  Fact Sheet for Patients: bloggercourse.com  Fact Sheet for Healthcare Providers: seriousbroker.it  This test is not yet approved or cleared by the [place]  FDA and has been authorized for detection and/or diagnosis of SARS-CoV-2 by FDA under an Emergency Use Authorization (EUA). This EUA will remain in effect (meaning this test can be used) for the duration of the COVID-19 declaration under Section 564(b)(1) of the Act, 21 U.S.C. section 360bbb-3(b)(1), unless the authorization is terminated or revoked.     Resp Syncytial Virus by PCR NEGATIVE NEGATIVE Final    Comment: (NOTE) Fact Sheet for Patients: bloggercourse.com  Fact Sheet for Healthcare Providers: seriousbroker.it  This test is not yet approved or cleared by the [place]  FDA and has  been authorized for detection and/or diagnosis of SARS-CoV-2 by FDA under an Emergency Use Authorization (EUA). This EUA will remain in effect (meaning this test can be used) for the duration of the COVID-19 declaration under Section 564(b)(1) of the Act, 21 U.S.C. section 360bbb-3(b)(1), unless the authorization is terminated or revoked.  Performed at [facility], [address] [place], [place] [address]   MRSA Next Gen by PCR, Nasal     Status: Abnormal   Collection Time: 07/12/24 10:28 PM   Specimen: Nasal Mucosa; Nasal Swab  Result Value Ref Range Status   MRSA by PCR Next Gen DETECTED (A) NOT DETECTED Final    Comment: CRITICAL RESULT CALLED TO, READ BACK BY AND VERIFIED WITH:  MEJIA RN 07/13/2024 @ 0204 by DD (NOTE) The GeneXpert MRSA Assay (FDA approved for NASAL specimens only), is one component of a comprehensive MRSA colonization surveillance program. It is not intended to diagnose MRSA infection nor to guide or monitor treatment for MRSA infections. Test performance is not FDA approved in patients less than 41 years old. Performed at [facility], [address] [place], [place] [address]   Urine Culture (for pregnant, neutropenic or urologic patients or patients with an indwelling urinary catheter)     Status: None   Collection Time: 07/13/24  2:55 AM   Specimen: Urine, Clean Catch  Result Value Ref Range Status   Specimen Description URINE, CLEAN CATCH  Final   Special Requests NONE  Final   Culture   Final    NO GROWTH Performed at [facility], [address], [place], [place] [address]    Report Status 07/14/2024 FINAL  Final         Radiology Studies: No results found.      Scheduled Meds:  amLODipine   10 mg Oral Daily   aspirin  EC  81 mg Oral Daily   atorvastatin   40 mg Oral Daily   carvedilol   6.25 mg  Oral BID   Chlorhexidine  Gluconate Cloth  6 each Topical Daily   feeding supplement  237 mL Oral BID BM   ferrous  sulfate  325 mg Oral Once per day on Monday Wednesday Friday   heparin   5,000 Units Subcutaneous Q8H   insulin  aspart  0-15 Units Subcutaneous TID WC   lipase/protease/amylase  36,000 Units Oral BID AC   multivitamin  1 tablet Oral QHS   pantoprazole   40 mg Oral Daily   QUEtiapine   50 mg Oral QHS   Continuous Infusions:     LOS: 8 days    Time spent: 45 minutes spent on chart review, discussion with nursing staff, consultants, updating family and interview/physical exam; more than 50% of that time was spent in counseling and/or coordination of care.    Harlene RAYMOND Bowl, DO [facility] Available via Epic secure chat 7am-7pm After these hours, please refer to coverage provider listed on [email or web address] 07/20/2024, 11:48 AM

## 2024-07-20 NOTE — Progress Notes (Signed)
 Inpatient Rehab Admissions Coordinator:   Awaiting determination from [facility] on CIR prior auth request.  Will follow.   Reche Lowers, PT, DPT Admissions Coordinator [phone] 07/20/2024 10:52 AM

## 2024-07-20 NOTE — Progress Notes (Signed)
 [facility] night cross cover note:   Per pt's request, I have resumed her home prn bentyl  for abd pain.    Eva Pore, DO Hospitalist

## 2024-07-20 NOTE — Progress Notes (Signed)
 Nutrition Follow-up  DOCUMENTATION CODES:  Obesity unspecified  INTERVENTION:  Change Ensure to Glucerna Shake PO BID due to persistent hyperglycemia. Each supplement provides 220 Kcals and 10 grams of protein. Continue renal Multivitamin PO once daily. Continue Creon  and iron per provider. Continue regular diet.  NUTRITION DIAGNOSIS:  Inadequate oral intake related to acute illness as evidenced by NPO status - resolved with diet advancement  GOAL:  Patient will meet greater than or equal to 90% of their needs - currently being met with good meal and ONS intake, continue to monitor  MONITOR:  Labs, Weight trends, I & O's, PO intake, Supplement acceptance  REASON FOR ASSESSMENT:  Follow-up for: Consult Other (Comment) (CRRT protocol)  ASSESSMENT:  Patient presented with altered mental status and AKI requiring CRRT. PMH significant for CKD3b, HTN, dyslipidemia, prediabetes, IBS, stroke 2021, osteoporosis/osteopenia, and degenerative disc disease.  12/09 admitted to ICU, CRRT initiated 12/10 Cortrak placed, TF started, CT head negative 12/11 CRRT stopped 12/12 self-removed Cortrak, SLP recommends Dys3/thin, remains agitated and confused. 12/15 Patient cognition much improved. SLP upgraded to reg/thin and signed off. 12/17 attending added Creon , reason not noted  Visited the patient who states she is eating very well and has a great appetite. She tells me she always has a good appetite and has never had a problem with PO intake. She is eating about 75-100% of her meals and drinking 3-4 Ensures a day and really likes them. Her UBW is 230 lbs and it has been stable. She is surprised she actually gained weight this admission. She had visited a doctor in the past to try and lose weight and was prescribed a medication for that which she cannot remember the name of. She admits she does not have a recollection of what happened this admission except that she was agitated at some point and that  her daughter was asking her a lot of questions. She has partial dentures which she does not wear and can eat perfectly fine without them. She jokes that her grandchildren are surprised she can eat steak without teeth.   Scheduled Meds:  amLODipine   10 mg Oral Daily   aspirin  EC  81 mg Oral Daily   atorvastatin   40 mg Oral Daily   carvedilol   6.25 mg Oral BID   Chlorhexidine  Gluconate Cloth  6 each Topical Daily   feeding supplement  237 mL Oral BID BM   ferrous sulfate   325 mg Oral Once per day on Monday Wednesday Friday   heparin   5,000 Units Subcutaneous Q8H   insulin  aspart  0-15 Units Subcutaneous TID WC   lipase/protease/amylase  36,000 Units Oral BID AC   multivitamin  1 tablet Oral QHS   pantoprazole   40 mg Oral Daily   QUEtiapine   50 mg Oral QHS   Continuous Infusions: PRN Meds:.acetaminophen , dicyclomine , docusate sodium , HYDROcodone -acetaminophen , lip balm, melatonin, polyethylene glycol, sodium chloride  flush  Diet Order             Diet regular Room service appropriate? Yes with Assist; Fluid consistency: Thin  Diet effective now                  Meal Intake: 75-100% per patient  Labs:     Latest Ref Rng & Units 07/20/2024    5:30 AM 07/19/2024    5:09 AM 07/18/2024    5:45 AM  CMP  Glucose 70 - 99 mg/dL 774  853  844   BUN 8 - 23 mg/dL 34  33  31   Creatinine 0.44 - 1.00 mg/dL 8.43  8.19  8.21   Sodium 135 - 145 mmol/L 141  141  141   Potassium 3.5 - 5.1 mmol/L 4.8  4.4  3.8   Chloride 98 - 111 mmol/L 108  108  107   CO2 22 - 32 mmol/L 26  27  25    Calcium  8.9 - 10.3 mg/dL 9.1  8.6  8.7      I/O: -5.6 L since admit  NUTRITION - FOCUSED PHYSICAL EXAM: Flowsheet Row Most Recent Value  Orbital Region No depletion  Upper Arm Region No depletion  Thoracic and Lumbar Region No depletion  Buccal Region No depletion  Temple Region Mild depletion  Clavicle Bone Region No depletion  Clavicle and Acromion Bone Region No depletion  Scapular Bone Region  Unable to assess  Dorsal Hand Mild depletion  Patellar Region No depletion  Anterior Thigh Region Mild depletion  Posterior Calf Region Unable to assess  Edema (RD Assessment) None  Hair Reviewed  Eyes Unable to assess  Mouth Unable to assess  Skin Reviewed  Nails Reviewed    EDUCATION NEEDS:  Education needs have been addressed  Skin:  Skin Assessment: Reviewed RN Assessment  Last BM:  12/16  Height:  Ht Readings from Last 1 Encounters:  07/12/24 5' 6 (1.676 m)   Weight:  Wt Readings from Last 10 Encounters:  07/20/24 107.7 kg  12/26/22 104.3 kg  12/22/22 104.8 kg  10/22/22 106.1 kg  07/02/22 114.3 kg  03/19/22 109.3 kg  09/18/21 100.7 kg  05/22/21 104.5 kg  03/20/21 102.5 kg  12/19/20 102.3 kg   Weight Change: stable weight noted PaTA and this admission  Usual Body Weight: 230 lbs per patient, stable  Edema: +1 BLE and non-pitting generalized  Ideal Body Weight:  59.1 kg   BMI:  Body mass index is 38.32 kg/m.  Estimated Nutritional Needs:  Kcal:  1600-1800 Protein:  85-100 Fluid:  >/=1600    Leverne Ruth, MS, RDN, LDN [place] See AMION for contact information Secure chat preferred

## 2024-07-20 NOTE — Plan of Care (Signed)
°  Problem: Education: Goal: Knowledge of General Education information will improve Description: Including pain rating scale, medication(s)/side effects and non-pharmacologic comfort measures Outcome: Progressing   Problem: Health Behavior/Discharge Planning: Goal: Ability to manage health-related needs will improve Outcome: Progressing   Problem: Clinical Measurements: Goal: Ability to maintain clinical measurements within normal limits will improve Outcome: Progressing Goal: Will remain free from infection Outcome: Progressing Goal: Diagnostic test results will improve Outcome: Progressing Goal: Respiratory complications will improve Outcome: Progressing Goal: Cardiovascular complication will be avoided Outcome: Progressing   Problem: Activity: Goal: Risk for activity intolerance will decrease Outcome: Progressing   Problem: Nutrition: Goal: Adequate nutrition will be maintained Outcome: Progressing   Problem: Coping: Goal: Level of anxiety will decrease Outcome: Progressing   Problem: Elimination: Goal: Will not experience complications related to bowel motility Outcome: Progressing Goal: Will not experience complications related to urinary retention Outcome: Progressing   Problem: Pain Managment: Goal: General experience of comfort will improve and/or be controlled Outcome: Progressing   Problem: Safety: Goal: Ability to remain free from injury will improve Outcome: Progressing   Problem: Skin Integrity: Goal: Risk for impaired skin integrity will decrease Outcome: Progressing   Problem: Clinical Measurements: Goal: Complications related to the disease process or treatment will be avoided or minimized Outcome: Progressing   Problem: Education: Goal: Ability to describe self-care measures that may prevent or decrease complications (Diabetes Survival Skills Education) will improve Outcome: Progressing Goal: Individualized Educational Video(s) Outcome:  Progressing   Problem: Coping: Goal: Ability to adjust to condition or change in health will improve Outcome: Progressing   Problem: Fluid Volume: Goal: Ability to maintain a balanced intake and output will improve Outcome: Progressing   Problem: Health Behavior/Discharge Planning: Goal: Ability to identify and utilize available resources and services will improve Outcome: Progressing Goal: Ability to manage health-related needs will improve Outcome: Progressing   Problem: Metabolic: Goal: Ability to maintain appropriate glucose levels will improve Outcome: Progressing   Problem: Nutritional: Goal: Maintenance of adequate nutrition will improve Outcome: Progressing Goal: Progress toward achieving an optimal weight will improve Outcome: Progressing   Problem: Skin Integrity: Goal: Risk for impaired skin integrity will decrease Outcome: Progressing   Problem: Tissue Perfusion: Goal: Adequacy of tissue perfusion will improve Outcome: Progressing

## 2024-07-21 DIAGNOSIS — N179 Acute kidney failure, unspecified: Secondary | ICD-10-CM | POA: Diagnosis not present

## 2024-07-21 LAB — RENAL FUNCTION PANEL
Albumin: 3.7 g/dL (ref 3.5–5.0)
Anion gap: 8 (ref 5–15)
BUN: 31 mg/dL — ABNORMAL HIGH (ref 8–23)
CO2: 24 mmol/L (ref 22–32)
Calcium: 9.1 mg/dL (ref 8.9–10.3)
Chloride: 108 mmol/L (ref 98–111)
Creatinine, Ser: 1.43 mg/dL — ABNORMAL HIGH (ref 0.44–1.00)
GFR, Estimated: 39 mL/min — ABNORMAL LOW (ref >60)
Glucose, Bld: 145 mg/dL — ABNORMAL HIGH (ref 70–99)
Phosphorus: 2.6 mg/dL (ref 2.5–4.6)
Potassium: 5.2 mmol/L — ABNORMAL HIGH (ref 3.5–5.1)
Sodium: 140 mmol/L (ref 135–145)

## 2024-07-21 LAB — GLUCOSE, CAPILLARY
Glucose-Capillary: 144 mg/dL — ABNORMAL HIGH (ref 70–99)
Glucose-Capillary: 186 mg/dL — ABNORMAL HIGH (ref 70–99)
Glucose-Capillary: 217 mg/dL — ABNORMAL HIGH (ref 70–99)
Glucose-Capillary: 225 mg/dL — ABNORMAL HIGH (ref 70–99)

## 2024-07-21 LAB — CBC
HCT: 24.1 % — ABNORMAL LOW (ref 36.0–46.0)
Hemoglobin: 7 g/dL — ABNORMAL LOW (ref 12.0–15.0)
MCH: 27 pg (ref 26.0–34.0)
MCHC: 29 g/dL — ABNORMAL LOW (ref 30.0–36.0)
MCV: 93.1 fL (ref 80.0–100.0)
Platelets: 257 K/uL (ref 150–400)
RBC: 2.59 MIL/uL — ABNORMAL LOW (ref 3.87–5.11)
RDW: 17.2 % — ABNORMAL HIGH (ref 11.5–15.5)
WBC: 7.8 K/uL (ref 4.0–10.5)
nRBC: 0 % (ref 0.0–0.2)

## 2024-07-21 MED ORDER — SODIUM ZIRCONIUM CYCLOSILICATE 5 G PO PACK
5.0000 g | PACK | Freq: Once | ORAL | Status: AC
  Administered 2024-07-21: 14:00:00 5 g via ORAL
  Filled 2024-07-21: qty 1

## 2024-07-21 MED ORDER — QUETIAPINE FUMARATE 25 MG PO TABS
25.0000 mg | ORAL_TABLET | Freq: Every day | ORAL | Status: DC
  Administered 2024-07-21 – 2024-07-25 (×5): 25 mg via ORAL
  Filled 2024-07-21 (×5): qty 1

## 2024-07-21 NOTE — Progress Notes (Signed)
 Physical Therapy Treatment Patient Details Name: Alison Whitehead MRN: [id number] DOB: 09-06-1950 Today's Date: 07/21/2024   History of Present Illness Patient is a 73y.o. female admitted on 07/12/24 after being found with AMS by her daughter. She was found to have AKI requiring CRRT and bicarb drip. PMHx: CKD stage 3, HTN, HLD, and prediabetes.    PT Comments  Pt received in supine and agreeable to PT session. Pt was impulsive throughout session with decreased safety awareness. Pt declined use of gait belt and did not want close guarding during mobility. Therefore, pt was CGA/supervision to stand and ambulate a total of 41ft with RW. When standing up from the toilet, pt had a loss of balance recovered with a forward step and reliance on UE support. Discussed d/c planning with pt wanting time to decide and for another provider to discuss options. Pt was frustrated with the change in plan and requested for PT to leave. At this time, recommending <3hrs post acute rehab as pt is at a high risk of falling due to impulsivity and decreased safety awareness. If pt decides to go home, pt would need 24/7 assist for safety. Acute PT to follow.    If plan is discharge home, recommend the following: A lot of help with walking and/or transfers;A little help with bathing/dressing/bathroom;Assist for transportation;Help with stairs or ramp for entrance   Can travel by private vehicle     Yes  Equipment Recommendations  Wheelchair (measurements PT);Wheelchair cushion (measurements PT)       Precautions / Restrictions Precautions Precautions: Fall Recall of Precautions/Restrictions: Intact Restrictions Weight Bearing Restrictions Per Provider Order: No     Mobility  Bed Mobility Overal bed mobility: Needs Assistance Bed Mobility: Supine to Sit, Sit to Supine    Supine to sit: Supervision, HOB elevated Sit to supine: Supervision, HOB elevated     Transfers Overall transfer level: Needs  assistance Equipment used: Rolling walker (2 wheels) Transfers: Sit to/from Stand, Bed to chair/wheelchair/BSC Sit to Stand: Supervision   Step pivot transfers: Supervision     General transfer comment: Pt declined close guarding with close supervision for safety. Poor proximity to RW when turning with pt pushing it out of the way. One loss of balance after standing from toilet with pt able to recover with forward step and use of RW. Poor eccentric control    Ambulation/Gait Ambulation/Gait assistance: Contact guard assist, Supervision Gait Distance (Feet): 15 Feet (x2) Assistive device: Rolling walker (2 wheels) Gait Pattern/deviations: Step-through pattern, Trunk flexed, Wide base of support Gait velocity: decr    General Gait Details: Wide BOS with forward flexed trunk. Impulsive with moving quickly and not wanting close guarding or gait belt   Balance Overall balance assessment: Needs assistance Sitting-balance support: Feet supported, No upper extremity supported Sitting balance-Leahy Scale: Good    Standing balance support: Bilateral upper extremity supported, During functional activity Standing balance-Leahy Scale: Poor Standing balance comment: reliant on UE support     Communication Communication Communication: No apparent difficulties  Cognition Arousal: Alert Behavior During Therapy: Agitated, Restless   PT - Cognitive impairments: Safety/Judgement, Memory, Problem solving    PT - Cognition Comments: very impulsive with pt declining gait belt and close guarding with mobility. Decreased safety awareness regarding use of RW Following commands: Intact      Cueing Cueing Techniques: Verbal cues     General Comments General comments (skin integrity, edema, etc.): VSS on RA, no complaints of dizziness      Pertinent Vitals/Pain Pain Assessment  Pain Assessment: Faces Faces Pain Scale: Hurts little more Pain Location: knees and low back Pain Descriptors /  Indicators: Discomfort Pain Intervention(s): Limited activity within patient's tolerance, Monitored during session, Repositioned     PT Goals (current goals can now be found in the care plan section) Acute Rehab PT Goals PT Goal Formulation: With patient/family Time For Goal Achievement: 07/31/24 Potential to Achieve Goals: Good Progress towards PT goals: Progressing toward goals    Frequency    Min 2X/week       AM-PAC PT 6 Clicks Mobility   Outcome Measure  Help needed turning from your back to your side while in a flat bed without using bedrails?: A Little Help needed moving from lying on your back to sitting on the side of a flat bed without using bedrails?: A Little Help needed moving to and from a bed to a chair (including a wheelchair)?: A Little Help needed standing up from a chair using your arms (e.g., wheelchair or bedside chair)?: A Little Help needed to walk in hospital room?: A Little Help needed climbing 3-5 steps with a railing? : Total 6 Click Score: 16    End of Session Equipment Utilized During Treatment:  (declined use of gait belt) Activity Tolerance: Other (comment) (limited by agitation) Patient left: in bed;with call bell/phone within reach;with bed alarm set Nurse Communication: Mobility status PT Visit Diagnosis: Unsteadiness on feet (R26.81);Difficulty in walking, not elsewhere classified (R26.2);Muscle weakness (generalized) (M62.81)     Time: 9045-8990 PT Time Calculation (min) (ACUTE ONLY): 15 min  Charges:    $Gait Training: 8-22 mins PT General Charges $$ ACUTE PT VISIT: 1 Visit                    Kate ORN, PT, DPT Secure Chat Preferred  Rehab Office [phone]   Kate BRAVO Wendolyn 07/21/2024, 10:50 AM

## 2024-07-21 NOTE — Plan of Care (Signed)

## 2024-07-21 NOTE — [place] FL2 (Signed)
 [place]  MEDICAID [id number] LEVEL OF CARE FORM     IDENTIFICATION  Patient Name: Alison Whitehead Birthdate: 28-May-1951 Sex: female Admission Date (Current Location): 07/12/2024  [facility] and [place] Number:  Producer, Television/film/video and Address:  [facility] [place], [address], [place], [place] [address]      Provider Number: [id number]  Attending Physician Name and Address:  Juvenal Harlene PENNER, DO  Relative Name and Phone Number:  [name] Mau (daughter) [phone]    Current Level of Care: Hospital Recommended Level of Care: Skilled Nursing Facility Prior Approval Number:    Date Approved/Denied:   PASRR Number: [id number]  Discharge Plan: SNF    Current Diagnoses: Patient Active Problem List   Diagnosis Date Noted   Debility 07/18/2024   Metabolic encephalopathy 07/18/2024   Primary osteoarthritis of right knee 07/18/2024   Shock (HCC) 07/13/2024   Total knee replacement status, 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   Rotator cuff syndrome, right 01/11/2020   Labile blood glucose    Vascular headache    Small vessel disease, cerebrovascular    Prediabetes    AKI (acute kidney injury)    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   Acute cerebrovascular accident (CVA) (HCC) 09/30/2019   Unilateral primary osteoarthritis, left knee 03/17/2019   Unilateral primary osteoarthritis, right knee 03/17/2019   Bilateral primary osteoarthritis of knee 03/10/2019   INSOMNIA, CHRONIC 05/19/2007   SPONDYLOSIS, LUMBAR 05/19/2007   Essential hypertension 05/03/2007   ARTHRITIS, KNEE 05/03/2007    Orientation RESPIRATION BLADDER Height & Weight     Self, Time, Situation, Place  Normal Continent Weight: 237 lb 6.6 oz (107.7 kg) Height:  5' 6 (167.6 cm)  BEHAVIORAL SYMPTOMS/MOOD NEUROLOGICAL BOWEL NUTRITION STATUS      Continent Diet (Please see discharge summary)  AMBULATORY STATUS COMMUNICATION OF NEEDS Skin   Limited Assist Verbally Other (Comment)  (Abrasion,Elbow,R,Erythema,sacrum,Bil.,Scratch marks,arm,Bil.,Wound/Incision LDAs,Wound,Elbow,Post.,R,Wound,Elbow,L,Post.)                       Personal Care Assistance Level of Assistance  Bathing, Feeding, Dressing Bathing Assistance: Maximum assistance Feeding assistance: Independent Dressing Assistance: Maximum assistance     Functional Limitations Info  Sight, Hearing, Speech Sight Info:  Financial Trader) Hearing Info: Adequate Speech Info: Adequate    SPECIAL CARE FACTORS FREQUENCY  PT (By licensed PT), OT (By licensed OT)     PT Frequency: 5x min weekly OT Frequency: 5x min weekly            Contractures Contractures Info: Not present    Additional Factors Info  Code Status, Allergies, Insulin  Sliding Scale, Psychotropic Code Status Info: FULL Allergies Info: Penicillins Psychotropic Info: QUEtiapine  (SEROQUEL ) tablet 25 mg daily at bedtime Insulin  Sliding Scale Info: insulin  aspart (novoLOG ) injection 0-15 Units 3 times daily with meals       Current Medications (07/21/2024):  This is the current hospital active medication list Current Facility-Administered Medications  Medication Dose Route Frequency Provider Last Rate Last Admin   acetaminophen  (TYLENOL ) tablet 650 mg  650 mg Oral Q6H PRN Vann, Jessica U, DO   650 mg at 07/21/24 1222   amLODipine  (NORVASC ) tablet 10 mg  10 mg Oral Daily Vann, Jessica U, DO   10 mg at 07/21/24 9147   aspirin  EC tablet 81 mg  81 mg Oral Daily Millen, Jessica B, RPH   81 mg at 07/21/24 9147   atorvastatin  (LIPITOR) tablet 40 mg  40 mg Oral Daily Autry, Lauren E, PA-C   40 mg  at 07/21/24 0852   carvedilol  (COREG ) tablet 6.25 mg  6.25 mg Oral BID Vann, Jessica U, DO   6.25 mg at 07/21/24 9147   Chlorhexidine  Gluconate Cloth 2 % PADS 6 each  6 each Topical Daily Payne, John D, PA-C   6 each at 07/21/24 9143   dicyclomine  (BENTYL ) capsule 10 mg  10 mg Oral QID PRN Howerter, Justin B, DO   10 mg at 07/20/24 0542   docusate sodium   (COLACE) capsule 100 mg  100 mg Oral BID PRN Payne, John D, PA-C   100 mg at 07/21/24 9147   feeding supplement (GLUCERNA SHAKE) (GLUCERNA SHAKE) liquid 237 mL  237 mL Oral TID BM Juvenal Raisin U, DO   237 mL at 07/21/24 9147   ferrous sulfate  tablet 325 mg  325 mg Oral Once per day on Monday Wednesday Friday Autry, Lauren E, PA-C   325 mg at 07/20/24 9063   heparin  injection 5,000 Units  5,000 Units Subcutaneous Q8H Payne, John D, PA-C   5,000 Units at 07/21/24 0645   HYDROcodone -acetaminophen  (NORCO) 10-325 MG per tablet 1 tablet  1 tablet Oral Q6H PRN Vann, Jessica U, DO   1 tablet at 07/21/24 0855   insulin  aspart (novoLOG ) injection 0-15 Units  0-15 Units Subcutaneous TID WC Merilee Linsey I, RPH   3 Units at 07/21/24 1220   lip balm (CARMEX) ointment   Topical PRN Ogan, Okoron[place]wo U, MD   1 Application at 07/13/24 9487   lipase/protease/amylase (CREON ) capsule 36,000 Units  36,000 Units Oral BID AC Juvenal Raisin PENNER, DO   36,000 Units at 07/21/24 9353   melatonin tablet 3 mg  3 mg Oral QHS PRN Harold Scholz, MD   3 mg at 07/20/24 2051   multivitamin (RENA-VIT) tablet 1 tablet  1 tablet Oral QHS Millen, Jessica B, RPH   1 tablet at 07/20/24 2052   pantoprazole  (PROTONIX ) EC tablet 40 mg  40 mg Oral Daily Aventura, Emily T, MD   40 mg at 07/21/24 9147   polyethylene glycol (MIRALAX  / GLYCOLAX ) packet 17 g  17 g Oral Daily PRN Payne, John D, PA-C   17 g at 07/21/24 9147   QUEtiapine  (SEROQUEL ) tablet 25 mg  25 mg Oral QHS Vann, Jessica U, DO       sodium chloride  flush (NS) 0.9 % injection 10-40 mL  10-40 mL Intracatheter PRN Meade Verdon RAMAN, MD       sodium zirconium cyclosilicate  (LOKELMA ) packet 5 g  5 g Oral Once Vann, Jessica U, DO         Discharge Medications: Please see discharge summary for a list of discharge medications.  Relevant Imaging Results:  Relevant Lab Results:   Additional Information [id number]  Isaiah Public, LCSWA

## 2024-07-21 NOTE — Progress Notes (Signed)
 PROGRESS NOTE    Alison Whitehead  FMW:[id number] DOB: 09-Oct-1950 DOA: 07/12/2024 PCP: Bridgette Sluder, PA    Brief Narrative:  Patient is a 73 yo F w/ pertinent PMH CKD 3b, HTN, HLD, prediabetes presents to [facility] on 12/9 with AMS.  Found to have acute kidney injury and underwent CRRT and bicarb drip.  Patient's creatinine seems to be back to baseline her mental status has improved but not back to baseline.  Insurance denied CIR, 7 outpatient plans to go to skilled nursing facility   Assessment and Plan:  AKI on CKD 3B, likely due to ischemic ATN from hypovolemic shock Hypocalcemia Patient presented with creatinine 16.1 on admission, her baseline serum creatinine 1.73 with GFR 02 Jan 2023 Remain off CRRT and nephrology has signed off Serum creatinine at baseline - Foley removed   Shock, likely hypovolemic, resolved Sepsis was ruled out Cultures remain negative Patient is off vasopressors White count remained within normal limits   Hyperkalemia -lokelma   x 1  Acute encephalopathy, likely due to uremia -Appear back to baseline - Will slowly wean off Seroquel  that was started in the ICU  Low phosphorus - Repleted  Thrombocytopenia, unclear etiology -Resolved   HTN/HLD Continue statin - Start to resume blood pressure meds as tolerates   Prediabetes Patient's hemoglobin A1c is 5.8 Continue sliding scale insulin  with CBG goal 140-180   Anemia of chronic disease -no signs of active bleeding Monitor H&H and transfuse if less than 7 -type and screen in the AM   Obesity Estimated body mass index is 38.32 kg/m as calculated from the following:   Height as of this encounter: 5' 6 (1.676 m).   Weight as of this encounter: 107.7 kg.    Foreign body in colon: - Discussed with PCCM-does not appear to be causing her issues and should pass on own  Await SNF placement   DVT prophylaxis: heparin  injection 5,000 Units Start: 07/13/24 0600    Code Status: Full  Code   Disposition Plan:  Level of care: Med-Surg Status is: Inpatient     Consultants:  Renal PCCM   Subjective: Upset CIR was denied by her insurance but agreeable to skilled nursing facility  Objective: Vitals:   07/20/24 1624 07/20/24 2022 07/21/24 0531 07/21/24 0738  BP: 123/84 94/80 124/85 114/74  Pulse: 82 (!) 26 (!) 21 97  Resp: 14 20  18   Temp: 97.7 F (36.5 C) 98.3 F (36.8 C) 98.1 F (36.7 C) 98.1 F (36.7 C)  TempSrc: Oral Oral Oral Oral  SpO2: 99% 90% 96%   Weight:      Height:        Intake/Output Summary (Last 24 hours) at 07/21/2024 1131 Last data filed at 07/20/2024 1300 Gross per 24 hour  Intake 240 ml  Output --  Net 240 ml   Filed Weights   07/18/24 0356 07/19/24 0345 07/20/24 0444  Weight: 104 kg 107.9 kg 107.7 kg    Examination:   General: Appearance:    Obese female in no acute distress     Lungs:     respirations unlabored  Heart:    Normal heart rate. .    MS:   All extremities are intact.    Neurologic:   Awake, alert       Data Reviewed: I have personally reviewed following labs and imaging studies  CBC: Recent Labs  Lab 07/16/24 1520 07/17/24 0546 07/18/24 0545 07/19/24 0509 07/21/24 0451  WBC 7.9 7.6 6.9 6.6 7.8  NEUTROABS 4.4  4.1  --   --   --   HGB 8.0* 8.0* 7.8* 7.7* 7.0*  HCT 26.4* 25.7* 25.7* 26.4* 24.1*  MCV 88.0 86.0 88.0 90.1 93.1  PLT 186 181 214 252 257   Basic Metabolic Panel: Recent Labs  Lab 07/15/24 0346 07/15/24 1810 07/17/24 0546 07/18/24 0545 07/19/24 0509 07/20/24 0530 07/21/24 0451  NA 144   < > 142 141 141 141 140  K 3.7   < > 3.9 3.8 4.4 4.8 5.2*  CL 110   < > 104 107 108 108 108  CO2 23   < > 28 25 27 26 24   GLUCOSE 179*   < > 112* 155* 146* 225* 145*  BUN 52*   < > 28* 31* 33* 34* 31*  CREATININE 2.55*   < > 1.66* 1.78* 1.80* 1.56* 1.43*  CALCIUM  7.5*   < > 9.0 8.7* 8.6* 9.1 9.1  MG 2.3  --  1.8  --  1.8 1.9  --   PHOS 2.6   < > 1.7* 2.9 3.1 2.4* 2.6   < > = values in  this interval not displayed.   GFR: Estimated Creatinine Clearance: 44.2 mL/min (A) (by C-G formula based on SCr of 1.43 mg/dL (H)). Liver Function Tests: Recent Labs  Lab 07/17/24 0546 07/18/24 0545 07/19/24 0509 07/20/24 0530 07/21/24 0451  ALBUMIN  3.3* 3.1* 3.2* 3.7 3.7   No results for input(s): LIPASE, AMYLASE in the last 168 hours.  No results for input(s): AMMONIA in the last 168 hours.  Coagulation Profile: No results for input(s): INR, PROTIME in the last 168 hours.  Cardiac Enzymes: No results for input(s): CKTOTAL, CKMB, CKMBINDEX, TROPONINI in the last 168 hours. BNP (last 3 results) No results for input(s): PROBNP in the last 8760 hours. HbA1C: No results for input(s): HGBA1C in the last 72 hours. CBG: Recent Labs  Lab 07/20/24 0819 07/20/24 1205 07/20/24 1627 07/20/24 2045 07/21/24 0735  GLUCAP 192* 263* 238* 163* 144*   Lipid Profile: No results for input(s): CHOL, HDL, LDLCALC, TRIG, CHOLHDL, LDLDIRECT in the last 72 hours. Thyroid Function Tests: No results for input(s): TSH, T4TOTAL, FREET4, T3FREE, THYROIDAB in the last 72 hours. Anemia Panel: No results for input(s): VITAMINB12, FOLATE, FERRITIN, TIBC, IRON, RETICCTPCT in the last 72 hours.  Sepsis Labs: Recent Labs  Lab 07/15/24 1150  PROCALCITON 0.17    Recent Results (from the past 240 hours)  Blood Culture (routine x 2)     Status: None   Collection Time: 07/12/24  7:29 PM   Specimen: BLOOD RIGHT FOREARM  Result Value Ref Range Status   Specimen Description BLOOD RIGHT FOREARM  Final   Special Requests   Final    BOTTLES DRAWN AEROBIC AND ANAEROBIC Blood Culture adequate volume   Culture   Final    NO GROWTH 5 DAYS Performed at [facility], [address], [place], [place] [address]    Report Status 07/17/2024 FINAL  Final  Blood Culture (routine x 2)     Status: None   Collection Time: 07/12/24  7:29 PM   Specimen:  BLOOD LEFT FOREARM  Result Value Ref Range Status   Specimen Description BLOOD LEFT FOREARM  Final   Special Requests   Final    BOTTLES DRAWN AEROBIC AND ANAEROBIC Blood Culture results may not be optimal due to an inadequate volume of blood received in culture bottles   Culture   Final    NO GROWTH 5 DAYS Performed at  [facility], [address], [place], [place] [address]    Report Status 07/17/2024 FINAL  Final  Resp panel by RT-PCR (RSV, Flu A&B, Covid) Anterior Nasal Swab     Status: None   Collection Time: 07/12/24  7:29 PM   Specimen: Anterior Nasal Swab  Result Value Ref Range Status   SARS Coronavirus 2 by RT PCR NEGATIVE NEGATIVE Final   Influenza A by PCR NEGATIVE NEGATIVE Final   Influenza B by PCR NEGATIVE NEGATIVE Final    Comment: (NOTE) The Xpert Xpress SARS-CoV-2/FLU/RSV plus assay is intended as an aid in the diagnosis of influenza from Nasopharyngeal swab specimens and should not be used as a sole basis for treatment. Nasal washings and aspirates are unacceptable for Xpert Xpress SARS-CoV-2/FLU/RSV testing.  Fact Sheet for Patients: bloggercourse.com  Fact Sheet for Healthcare Providers: seriousbroker.it  This test is not yet approved or cleared by the [place]  FDA and has been authorized for detection and/or diagnosis of SARS-CoV-2 by FDA under an Emergency Use Authorization (EUA). This EUA will remain in effect (meaning this test can be used) for the duration of the COVID-19 declaration under Section 564(b)(1) of the Act, 21 U.S.C. section 360bbb-3(b)(1), unless the authorization is terminated or revoked.     Resp Syncytial Virus by PCR NEGATIVE NEGATIVE Final    Comment: (NOTE) Fact Sheet for Patients: bloggercourse.com  Fact Sheet for Healthcare Providers: seriousbroker.it  This test is not yet approved or cleared by the [place]  FDA  and has been authorized for detection and/or diagnosis of SARS-CoV-2 by FDA under an Emergency Use Authorization (EUA). This EUA will remain in effect (meaning this test can be used) for the duration of the COVID-19 declaration under Section 564(b)(1) of the Act, 21 U.S.C. section 360bbb-3(b)(1), unless the authorization is terminated or revoked.  Performed at [facility], [address], [place], [place] [address]   MRSA Next Gen by PCR, Nasal     Status: Abnormal   Collection Time: 07/12/24 10:28 PM   Specimen: Nasal Mucosa; Nasal Swab  Result Value Ref Range Status   MRSA by PCR Next Gen DETECTED (A) NOT DETECTED Final    Comment: CRITICAL RESULT CALLED TO, READ BACK BY AND VERIFIED WITH:  MEJIA RN 07/13/2024 @ 0204 by DD (NOTE) The GeneXpert MRSA Assay (FDA approved for NASAL specimens only), is one component of a comprehensive MRSA colonization surveillance program. It is not intended to diagnose MRSA infection nor to guide or monitor treatment for MRSA infections. Test performance is not FDA approved in patients less than 49 years old. Performed at [facility], [address] [place], [place] [address]   Urine Culture (for pregnant, neutropenic or urologic patients or patients with an indwelling urinary catheter)     Status: None   Collection Time: 07/13/24  2:55 AM   Specimen: Urine, Clean Catch  Result Value Ref Range Status   Specimen Description URINE, CLEAN CATCH  Final   Special Requests NONE  Final   Culture   Final    NO GROWTH Performed at [facility], [address], [place], [place] [address]    Report Status 07/14/2024 FINAL  Final         Radiology Studies: No results found.      Scheduled Meds:  amLODipine   10 mg Oral Daily   aspirin  EC  81 mg Oral Daily   atorvastatin   40 mg Oral Daily   carvedilol   6.25  mg Oral BID   Chlorhexidine  Gluconate Cloth  6 each Topical Daily   feeding supplement (GLUCERNA SHAKE)  237 mL  Oral TID BM   ferrous sulfate   325 mg Oral Once per day on Monday Wednesday Friday   heparin   5,000 Units Subcutaneous Q8H   insulin  aspart  0-15 Units Subcutaneous TID WC   lipase/protease/amylase  36,000 Units Oral BID AC   multivitamin  1 tablet Oral QHS   pantoprazole   40 mg Oral Daily   QUEtiapine   50 mg Oral QHS   Continuous Infusions:     LOS: 9 days    Time spent: 45 minutes spent on chart review, discussion with nursing staff, consultants, updating family and interview/physical exam; more than 50% of that time was spent in counseling and/or coordination of care.    Harlene RAYMOND Bowl, DO [facility] Available via Epic secure chat 7am-7pm After these hours, please refer to coverage provider listed on [email or web address] 07/21/2024, 11:31 AM

## 2024-07-21 NOTE — TOC Progression Note (Addendum)
 Transition of Care [facility]) - Progression Note    Patient Details  Name: Alison Whitehead MRN: [id number] Date of Birth: 1950/12/26  [facility] CM/SW Contact  Isaiah Public, LCSWA Phone Number: 07/21/2024, 12:02 PM  Clinical Narrative:     CSW spoke with patient and [name] by phone at bedside. CSW discussed PT recommendations for SNF. Patient reports PTA she comes from home alone.Patient expressed understanding of PT recommendation and is agreeable to SNF placement at time of discharge.Patient gave CSW permission to fax out initial referral for SNF placement. CSW discussed insurance authorization process and will provide Medicare SNF ratings list with accepted SNF bed offers when available. All questions answered. No further questions reported at this time. CSW to continue to follow and assist with discharge planning needs.   Update- CSW spoke with patient at bedside and provided medicare compare list with patients SNF bed offers. Patient accepted SNF bed offer with [facility]. Tanya with [facility] confirmed SNF bed. CSW requested for Natalie CMA to start insurance authorization for patient. CSW will continue to follow.  Expected Discharge Plan: IP Rehab Facility Barriers to Discharge: Continued Medical Work up               Expected Discharge Plan and Services In-house Referral: NA Discharge Planning Services: CM Consult Post Acute Care Choice: IP Rehab Living arrangements for the past 2 months: Apartment                                       Social Drivers of Health (SDOH) Interventions SDOH Screenings   Food Insecurity: No Food Insecurity (07/18/2024)  Housing: Low Risk (07/18/2024)  Transportation Needs: No Transportation Needs (07/18/2024)  Utilities: Not At Risk (07/18/2024)  Depression (PHQ2-9): Low Risk (07/02/2022)  Social Connections: Moderately Isolated (07/18/2024)  Tobacco Use: Medium Risk (07/18/2024)     Readmission Risk Interventions    12/28/2022    1:24 PM  Readmission Risk Prevention Plan  Post Dischage Appt Complete  Medication Screening Not Complete  Medication Screening Not Complete Comment N/A  Transportation Screening Not Complete  Transportation Screening Comment N/A

## 2024-07-21 NOTE — Progress Notes (Signed)
 Inpatient Rehab Admissions Coordinator:   Received notice from [facility] that request for CIR admission has been denied due to patient mobilizing too well.  I do not believe an appeal would be successful as she was already mobilizing with supervision with OT yesterday, and since an appeal would take about 72 hours she likely will have far surpassed our functional criteria for CIR admission.  I've notified her treatment team and I will f/u with her at bedside this afternoon to answer any questions.   Reche Lowers, PT, DPT Admissions Coordinator [phone] 07/21/2024 10:25 AM

## 2024-07-22 DIAGNOSIS — N179 Acute kidney failure, unspecified: Secondary | ICD-10-CM | POA: Diagnosis not present

## 2024-07-22 LAB — HEMOGLOBIN AND HEMATOCRIT, BLOOD
HCT: 27.3 % — ABNORMAL LOW (ref 36.0–46.0)
Hemoglobin: 8.5 g/dL — ABNORMAL LOW (ref 12.0–15.0)

## 2024-07-22 LAB — RENAL FUNCTION PANEL
Albumin: 3.9 g/dL (ref 3.5–5.0)
Anion gap: 9 (ref 5–15)
BUN: 30 mg/dL — ABNORMAL HIGH (ref 8–23)
CO2: 23 mmol/L (ref 22–32)
Calcium: 9.1 mg/dL (ref 8.9–10.3)
Chloride: 106 mmol/L (ref 98–111)
Creatinine, Ser: 1.37 mg/dL — ABNORMAL HIGH (ref 0.44–1.00)
GFR, Estimated: 41 mL/min — ABNORMAL LOW
Glucose, Bld: 235 mg/dL — ABNORMAL HIGH (ref 70–99)
Phosphorus: 2.9 mg/dL (ref 2.5–4.6)
Potassium: 5.1 mmol/L (ref 3.5–5.1)
Sodium: 138 mmol/L (ref 135–145)

## 2024-07-22 LAB — GLUCOSE, CAPILLARY
Glucose-Capillary: 297 mg/dL — ABNORMAL HIGH (ref 70–99)
Glucose-Capillary: 155 mg/dL — ABNORMAL HIGH (ref 70–99)
Glucose-Capillary: 146 mg/dL — ABNORMAL HIGH (ref 70–99)
Glucose-Capillary: 162 mg/dL — ABNORMAL HIGH (ref 70–99)

## 2024-07-22 LAB — PREPARE RBC (CROSSMATCH)

## 2024-07-22 LAB — CBC
HCT: 24.2 % — ABNORMAL LOW (ref 36.0–46.0)
Hemoglobin: 7.1 g/dL — ABNORMAL LOW (ref 12.0–15.0)
MCH: 26.8 pg (ref 26.0–34.0)
MCHC: 29.3 g/dL — ABNORMAL LOW (ref 30.0–36.0)
MCV: 91.3 fL (ref 80.0–100.0)
Platelets: 306 K/uL (ref 150–400)
RBC: 2.65 MIL/uL — ABNORMAL LOW (ref 3.87–5.11)
RDW: 17.2 % — ABNORMAL HIGH (ref 11.5–15.5)
WBC: 7.9 K/uL (ref 4.0–10.5)
nRBC: 0 % (ref 0.0–0.2)

## 2024-07-22 LAB — ABO/RH: ABO/RH(D): O POS

## 2024-07-22 MED ORDER — SODIUM CHLORIDE 0.9% IV SOLUTION
Freq: Once | INTRAVENOUS | Status: AC
  Administered 2024-07-22: 10 mL/h via INTRAVENOUS

## 2024-07-22 MED ORDER — DAPAGLIFLOZIN PROPANEDIOL 10 MG PO TABS
10.0000 mg | ORAL_TABLET | Freq: Every day | ORAL | Status: DC
  Administered 2024-07-22 – 2024-07-26 (×5): 10 mg via ORAL
  Filled 2024-07-22 (×5): qty 1

## 2024-07-22 NOTE — Plan of Care (Signed)

## 2024-07-22 NOTE — TOC Progression Note (Addendum)
 Transition of Care [facility]) - Progression Note    Patient Details  Name: Alison Whitehead MRN: [id number] Date of Birth: [birth year]  [facility]) CM/SW Contact  Isaiah Public, LCSWA Phone Number: 07/22/2024, 10:20 AM  Clinical Narrative:     Laneta CMA confirmed she will start patients insurance authorization for SNF today. Patient has SN[facility] bed at [facility]. CSW will continue to follow and assist with patients dc planning needs.  UpdateGLENWOOD Laneta CMA informed CSW that patients insurance shara is pending Cert # [id number].  Expected Discharge Plan: IP Rehab Facility Barriers to Discharge: Continued Medical Work up               Expected Discharge Plan and Services In-house Referral: NA Discharge Planning Services: CM Consult Post Acute Care Choice: IP Rehab Living arrangements for the past 2 months: Apartment                                       Social Drivers of Health (SDOH) Interventions SDOH Screenings   Food Insecurity: No Food Insecurity (07/18/2024)  Housing: Low Risk (07/18/2024)  Transportation Needs: No Transportation Needs (07/18/2024)  Utilities: Not At Risk (07/18/2024)  Depression (PHQ2-9): Low Risk (07/02/2022)  Social Connections: Moderately Isolated (07/18/2024)  Tobacco Use: Medium Risk (07/18/2024)    Readmission Risk Interventions    12/28/2022    1:24 PM  Readmission Risk Prevention Plan  Post Dischage Appt Complete  Medication Screening Not Complete  Medication Screening Not Complete Comment N/A  Transportation Screening Not Complete  Transportation Screening Comment N/A

## 2024-07-22 NOTE — Progress Notes (Signed)
 Physical Therapy Treatment Patient Details Name: Alison Whitehead MRN: [id number] DOB: [birth year] Today's Date: 07/22/2024   History of Present Illness Patient is a 73y.o. female admitted on 07/12/24 after being found with AMS by her daughter. She was found to have AKI requiring CRRT and bicarb drip. PMHx: CKD stage 3, HTN, HLD, and prediabetes.    PT Comments  Pt demonstrating good progress this session, ambulating with AD and no physical assistance (see gait section). Pt requesting to return to bed following bout of ambulation reporting she recently mobilized and was feeling fatigued. Pt continues to be impulsive requiring cueing for safety throughout session; no losses of balance or signs of instability noted. PT will continue to treat pt while she is admitted. Patient will benefit from continued inpatient follow up therapy, <3 hours/day.    If plan is discharge home, recommend the following: A lot of help with walking and/or transfers;A little help with bathing/dressing/bathroom;Assist for transportation;Help with stairs or ramp for entrance   Can travel by private vehicle     Yes  Equipment Recommendations  None recommended by PT    Recommendations for Other Services       Precautions / Restrictions Precautions Precautions: Fall Recall of Precautions/Restrictions: Intact Restrictions Weight Bearing Restrictions Per Provider Order: No     Mobility  Bed Mobility Overal bed mobility: Needs Assistance Bed Mobility: Supine to Sit     Supine to sit: Supervision, HOB elevated     General bed mobility comments: increased time to complete    Transfers Overall transfer level: Needs assistance Equipment used: Rolling walker (2 wheels) Transfers: Sit to/from Stand Sit to Stand: Contact guard assist           General transfer comment: Pt completed STS from EOB and bathroom with RW and no physical assistance. Pt utilizes grab bars in bathroom to assist with pull to  stand for peri care and completing full sit to stand. Increased time to complete.    Ambulation/Gait Ambulation/Gait assistance: Contact guard assist, Supervision Gait Distance (Feet): 50 Feet (20 and then 30) Assistive device: Rolling walker (2 wheels) Gait Pattern/deviations: Step-through pattern, Decreased stride length, Decreased weight shift to right, Decreased stance time - right, Trunk flexed, Wide base of support Gait velocity: reduced Gait velocity interpretation: <1.8 ft/sec, indicate of risk for recurrent falls   General Gait Details: Pt demonstrates reduced R knee flexion during swing phase and reduced WB during stance secondary to pain. Pt also demonstrates increased anterior trunk lean throughout, with several attempts to rest over walker but is able to stand upright with cueing. No losses of balance or signs of instability noted.   Stairs             Wheelchair Mobility     Tilt Bed    Modified Rankin (Stroke Patients Only)       Balance Overall balance assessment: Needs assistance Sitting-balance support: No upper extremity supported, Feet supported Sitting balance-Leahy Scale: Good Sitting balance - Comments: seated EOB   Standing balance support: Bilateral upper extremity supported, Reliant on assistive device for balance, During functional activity Standing balance-Leahy Scale: Poor Standing balance comment: reliant on UE support                            Communication Communication Communication: No apparent difficulties  Cognition Arousal: Alert Behavior During Therapy: WFL for tasks assessed/performed   PT - Cognitive impairments: Safety/Judgement  PT - Cognition Comments: Pt continues to be impulsive, leaning over RW  to look outside window and attempting to sit with body and RW turend 45 degrees away from bed with pt then performing quick pivot without RW and plopping down on bed. Following  commands: Intact      Cueing Cueing Techniques: Verbal cues  Exercises      General Comments General comments (skin integrity, edema, etc.): VSS      Pertinent Vitals/Pain Pain Assessment Pain Assessment: No/denies pain Pain Score: 0-No pain Pain Intervention(s): Monitored during session    Home Living                          Prior Function            PT Goals (current goals can now be found in the care plan section) Acute Rehab PT Goals Patient Stated Goal: get my strength back PT Goal Formulation: With patient/family Time For Goal Achievement: 07/31/24 Potential to Achieve Goals: Good Progress towards PT goals: Progressing toward goals    Frequency    Min 2X/week      PT Plan      Co-evaluation              AM-PAC PT 6 Clicks Mobility   Outcome Measure  Help needed turning from your back to your side while in a flat bed without using bedrails?: A Little Help needed moving from lying on your back to sitting on the side of a flat bed without using bedrails?: A Little Help needed moving to and from a bed to a chair (including a wheelchair)?: A Little Help needed standing up from a chair using your arms (e.g., wheelchair or bedside chair)?: A Little Help needed to walk in hospital room?: A Little Help needed climbing 3-5 steps with a railing? : Total 6 Click Score: 16    End of Session Equipment Utilized During Treatment: Gait belt Activity Tolerance: Patient tolerated treatment well Patient left: in bed;with call bell/phone within reach Nurse Communication: Mobility status PT Visit Diagnosis: Unsteadiness on feet (R26.81);Other abnormalities of gait and mobility (R26.89);Muscle weakness (generalized) (M62.81);Difficulty in walking, not elsewhere classified (R26.2);Pain Pain - Right/Left: Right Pain - part of body: Knee     Time: 1420-1433 PT Time Calculation (min) (ACUTE ONLY): 13 min  Charges:    $Therapeutic Activity: 8-22  mins PT General Charges $$ ACUTE PT VISIT: 1 Visit                     [name] DPT Acute Rehab Services [phone] Prefer contact via chat    Leontine [name] 07/22/2024, 4:01 PM

## 2024-07-22 NOTE — Progress Notes (Signed)
 " PROGRESS NOTE    Alison Whitehead  FMW:[id number] DOB: 1951/02/13 DOA: 07/12/2024 PCP: Bridgette Sluder, PA    Brief Narrative:  Patient is a 73 yo F w/ pertinent PMH CKD 3b, HTN, HLD, prediabetes presents to [facility] on 12/9 with AMS.  Found to have acute kidney injury and underwent CRRT and bicarb drip.  Patient's creatinine seems to be back to baseline her mental status has improved but not back to baseline.  Insurance denied CIR, 7 outpatient plans to go to skilled nursing facility.   Assessment and Plan:  AKI on CKD 3B, likely due to ischemic ATN from hypovolemic shock Hypocalcemia Patient presented with creatinine 16.1 on admission, her baseline serum creatinine 1.73 with GFR 02 Jan 2023 Remain off CRRT and nephrology has signed off Serum creatinine at baseline - Foley removed   Shock, likely hypovolemic, resolved Sepsis was ruled out Cultures remain negative Patient is off vasopressors White count remained within normal limits   Hyperkalemia -lokelma   x 1  Acute encephalopathy, likely due to uremia -Appear back to baseline - Will slowly wean off Seroquel  that was started in the ICU  Low phosphorus - Repleted  Thrombocytopenia, unclear etiology -Resolved   HTN/HLD Continue statin - Start to resume blood pressure meds as tolerates   Prediabetes Patient's hemoglobin A1c is 5.8 Continue sliding scale insulin  with CBG goal 140-180   Anemia of chronic disease -no signs of active bleeding - Patient agreeable to transfusion as her hemoglobin is hanging in around 7-she does have some shortness of breath and dizziness with exertion   Obesity Estimated body mass index is 38.77 kg/m as calculated from the following:   Height as of this encounter: 5' 6 (1.676 m).   Weight as of this encounter: 109 kg.    Foreign body in colon: - Discussed with PCCM-does not appear to be causing her issues and should pass on own  Await SNF placement   DVT prophylaxis: heparin   injection 5,000 Units Start: 07/13/24 0600    Code Status: Full Code   Disposition Plan:  Level of care: Med-Surg Status is: Inpatient     Consultants:  Renal PCCM   Subjective: No bleeding-agreeable to blood transfusion  Objective: Vitals:   07/22/24 0805 07/22/24 1013 07/22/24 1023 07/22/24 1040  BP: 112/79 111/78  126/76  Pulse: 90 85 (!) 29 85  Resp: 18 20  20   Temp: 97.9 F (36.6 C) 98.2 F (36.8 C)  98.5 F (36.9 C)  TempSrc: Oral Oral  Oral  SpO2: 99% 98% (!) 88% 99%  Weight:      Height:        Intake/Output Summary (Last 24 hours) at 07/22/2024 1053 Last data filed at 07/22/2024 1025 Gross per 24 hour  Intake 360 ml  Output --  Net 360 ml   Filed Weights   07/19/24 0345 07/20/24 0444 07/22/24 0430  Weight: 107.9 kg 107.7 kg 109 kg    Examination:   General: Appearance:    Obese female in no acute distress     Lungs:     respirations unlabored  Heart:    Normal heart rate. .    MS:   All extremities are intact.    Neurologic:   Awake, alert       Data Reviewed: I have personally reviewed following labs and imaging studies  CBC: Recent Labs  Lab 07/16/24 1520 07/17/24 0546 07/18/24 0545 07/19/24 0509 07/21/24 0451 07/22/24 0604  WBC 7.9 7.6 6.9 6.6 7.8 7.9  NEUTROABS 4.4 4.1  --   --   --   --   HGB 8.0* 8.0* 7.8* 7.7* 7.0* 7.1*  HCT 26.4* 25.7* 25.7* 26.4* 24.1* 24.2*  MCV 88.0 86.0 88.0 90.1 93.1 91.3  PLT 186 181 214 252 257 306   Basic Metabolic Panel: Recent Labs  Lab 07/17/24 0546 07/18/24 0545 07/19/24 0509 07/20/24 0530 07/21/24 0451 07/22/24 0604  NA 142 141 141 141 140 138  K 3.9 3.8 4.4 4.8 5.2* 5.1  CL 104 107 108 108 108 106  CO2 28 25 27 26 24 23   GLUCOSE 112* 155* 146* 225* 145* 235*  BUN 28* 31* 33* 34* 31* 30*  CREATININE 1.66* 1.78* 1.80* 1.56* 1.43* 1.37*  CALCIUM  9.0 8.7* 8.6* 9.1 9.1 9.1  MG 1.8  --  1.8 1.9  --   --   PHOS 1.7* 2.9 3.1 2.4* 2.6 2.9   GFR: Estimated Creatinine Clearance:  46.4 mL/min (A) (by C-G formula based on SCr of 1.37 mg/dL (H)). Liver Function Tests: Recent Labs  Lab 07/18/24 0545 07/19/24 0509 07/20/24 0530 07/21/24 0451 07/22/24 0604  ALBUMIN  3.1* 3.2* 3.7 3.7 3.9   No results for input(s): LIPASE, AMYLASE in the last 168 hours.  No results for input(s): AMMONIA in the last 168 hours.  Coagulation Profile: No results for input(s): INR, PROTIME in the last 168 hours.  Cardiac Enzymes: No results for input(s): CKTOTAL, CKMB, CKMBINDEX, TROPONINI in the last 168 hours. BNP (last 3 results) No results for input(s): PROBNP in the last 8760 hours. HbA1C: No results for input(s): HGBA1C in the last 72 hours. CBG: Recent Labs  Lab 07/21/24 0735 07/21/24 1139 07/21/24 1630 07/21/24 2102 07/22/24 0758  GLUCAP 144* 186* 217* 225* 297*   Lipid Profile: No results for input(s): CHOL, HDL, LDLCALC, TRIG, CHOLHDL, LDLDIRECT in the last 72 hours. Thyroid Function Tests: No results for input(s): TSH, T4TOTAL, FREET4, T3FREE, THYROIDAB in the last 72 hours. Anemia Panel: No results for input(s): VITAMINB12, FOLATE, FERRITIN, TIBC, IRON, RETICCTPCT in the last 72 hours.  Sepsis Labs: Recent Labs  Lab 07/15/24 1150  PROCALCITON 0.17    Recent Results (from the past 240 hours)  Blood Culture (routine x 2)     Status: None   Collection Time: 07/12/24  7:29 PM   Specimen: BLOOD RIGHT FOREARM  Result Value Ref Range Status   Specimen Description BLOOD RIGHT FOREARM  Final   Special Requests   Final    BOTTLES DRAWN AEROBIC AND ANAEROBIC Blood Culture adequate volume   Culture   Final    NO GROWTH 5 DAYS Performed at [facility], [address] [place], [place] [address]    Report Status 07/17/2024 FINAL  Final  Blood Culture (routine x 2)     Status: None   Collection Time: 07/12/24  7:29 PM   Specimen: BLOOD LEFT FOREARM  Result Value Ref Range Status   Specimen  Description BLOOD LEFT FOREARM  Final   Special Requests   Final    BOTTLES DRAWN AEROBIC AND ANAEROBIC Blood Culture results may not be optimal due to an inadequate volume of blood received in culture bottles   Culture   Final    NO GROWTH 5 DAYS Performed at [facility], [address] [place], [place] [address]    Report Status 07/17/2024 FINAL  Final  Resp panel by RT-PCR (RSV, Flu A&B, Covid) Anterior Nasal Swab     Status: None   Collection  Time: 07/12/24  7:29 PM   Specimen: Anterior Nasal Swab  Result Value Ref Range Status   SARS Coronavirus 2 by RT PCR NEGATIVE NEGATIVE Final   Influenza A by PCR NEGATIVE NEGATIVE Final   Influenza B by PCR NEGATIVE NEGATIVE Final    Comment: (NOTE) The Xpert Xpress SARS-CoV-2/FLU/RSV plus assay is intended as an aid in the diagnosis of influenza from Nasopharyngeal swab specimens and should not be used as a sole basis for treatment. Nasal washings and aspirates are unacceptable for Xpert Xpress SARS-CoV-2/FLU/RSV testing.  Fact Sheet for Patients: bloggercourse.com  Fact Sheet for Healthcare Providers: seriousbroker.it  This test is not yet approved or cleared by the [place]  FDA and has been authorized for detection and/or diagnosis of SARS-CoV-2 by FDA under an Emergency Use Authorization (EUA). This EUA will remain in effect (meaning this test can be used) for the duration of the COVID-19 declaration under Section 564(b)(1) of the Act, 21 U.S.C. section 360bbb-3(b)(1), unless the authorization is terminated or revoked.     Resp Syncytial Virus by PCR NEGATIVE NEGATIVE Final    Comment: (NOTE) Fact Sheet for Patients: bloggercourse.com  Fact Sheet for Healthcare Providers: seriousbroker.it  This test is not yet approved or cleared by the [place]  FDA and has been authorized for detection and/or diagnosis of  SARS-CoV-2 by FDA under an Emergency Use Authorization (EUA). This EUA will remain in effect (meaning this test can be used) for the duration of the COVID-19 declaration under Section 564(b)(1) of the Act, 21 U.S.C. section 360bbb-3(b)(1), unless the authorization is terminated or revoked.  Performed at [facility], [address], [place], [place] [address]   MRSA Next Gen by PCR, Nasal     Status: Abnormal   Collection Time: 07/12/24 10:28 PM   Specimen: Nasal Mucosa; Nasal Swab  Result Value Ref Range Status   MRSA by PCR Next Gen DETECTED (A) NOT DETECTED Final    Comment: CRITICAL RESULT CALLED TO, READ BACK BY AND VERIFIED WITH:  MEJIA RN 07/13/2024 @ 0204 by DD (NOTE) The GeneXpert MRSA Assay (FDA approved for NASAL specimens only), is one component of a comprehensive MRSA colonization surveillance program. It is not intended to diagnose MRSA infection nor to guide or monitor treatment for MRSA infections. Test performance is not FDA approved in patients less than 38 years old. Performed at [facility], [address] [place], [place] [address]   Urine Culture (for pregnant, neutropenic or urologic patients or patients with an indwelling urinary catheter)     Status: None   Collection Time: 07/13/24  2:55 AM   Specimen: Urine, Clean Catch  Result Value Ref Range Status   Specimen Description URINE, CLEAN CATCH  Final   Special Requests NONE  Final   Culture   Final    NO GROWTH Performed at [facility], [address] [place], [place] [address]    Report Status 07/14/2024 FINAL  Final         Radiology Studies: No results found.      Scheduled Meds:  amLODipine   10 mg Oral Daily   aspirin  EC  81 mg Oral Daily   atorvastatin   40 mg Oral Daily   carvedilol   6.25 mg Oral BID   Chlorhexidine  Gluconate Cloth  6 each Topical Daily   dapagliflozin  propanediol  10 mg Oral Daily   feeding supplement (GLUCERNA SHAKE)  237 mL Oral TID BM    ferrous sulfate   325  mg Oral Once per day on Monday Wednesday Friday   heparin   5,000 Units Subcutaneous Q8H   insulin  aspart  0-15 Units Subcutaneous TID WC   lipase/protease/amylase  36,000 Units Oral BID AC   multivitamin  1 tablet Oral QHS   pantoprazole   40 mg Oral Daily   QUEtiapine   25 mg Oral QHS   Continuous Infusions:     LOS: 10 days    Time spent: 45 minutes spent on chart review, discussion with nursing staff, consultants, updating family and interview/physical exam; more than 50% of that time was spent in counseling and/or coordination of care.    Harlene RAYMOND Bowl, DO [facility] Available via Epic secure chat 7am-7pm After these hours, please refer to coverage provider listed on [email or web address] 07/22/2024, 10:53 AM   "

## 2024-07-22 NOTE — Inpatient Diabetes Management (Addendum)
 Inpatient Diabetes Program Recommendations  AACE/ADA: New Consensus Statement on Inpatient Glycemic Control (2015)  Target Ranges:  Prepandial:   less than 140 mg/dL      Peak postprandial:   less than 180 mg/dL (1-2 hours)      Critically ill patients:  140 - 180 mg/dL    Latest Reference Range & Units 07/13/24 00:50  Hemoglobin A1C 4.8 - 5.6 % 5.8 (H)  (H): Data is abnormally high  Latest Reference Range & Units 07/21/24 07:35 07/21/24 11:39 07/21/24 16:30 07/21/24 21:02  Glucose-Capillary 70 - 99 mg/dL 855 (H)  2 units Novolog   186 (H)  3 units Novolog   217 (H)  5 units Novolog   225 (H)  (H): Data is abnormally high  Latest Reference Range & Units 07/22/24 07:58  Glucose-Capillary 70 - 99 mg/dL 702 (H)  8 units Novolog    (H): Data is abnormally high  Admit  AKI on CKD 3B, likely due to ischemic ATN from hypovolemic shock Hypocalcemia Shock, likely hypovolemia  History: DM2, CKD  Home DM Meds: Farxiga  10 mg daily       Metformin  1000 mg QPM       Ozempic  1 mg Qweek  Current Orders: Novolog  Moderate Correction Scale/ SSI (0-15 units) TID AC    MD- Unsure why CBG so elevated this AM?  (CBG 297)  If this trend continues, may consider adding low dose Basal insulin  while home meds are on hold:  Lantus 5 units daily (0.05 units/kg)    --Will follow patient during hospitalization--  Adina Rudolpho Arrow RN, MSN, CDCES Diabetes Coordinator Inpatient Glycemic Control Team Team Pager: [phone] (8a-5p)

## 2024-07-23 DIAGNOSIS — N179 Acute kidney failure, unspecified: Secondary | ICD-10-CM | POA: Diagnosis not present

## 2024-07-23 LAB — RENAL FUNCTION PANEL
Albumin: 4.1 g/dL (ref 3.5–5.0)
Anion gap: 10 (ref 5–15)
BUN: 30 mg/dL — ABNORMAL HIGH (ref 8–23)
CO2: 24 mmol/L (ref 22–32)
Calcium: 9.3 mg/dL (ref 8.9–10.3)
Chloride: 105 mmol/L (ref 98–111)
Creatinine, Ser: 1.38 mg/dL — ABNORMAL HIGH (ref 0.44–1.00)
GFR, Estimated: 40 mL/min — ABNORMAL LOW
Glucose, Bld: 136 mg/dL — ABNORMAL HIGH (ref 70–99)
Phosphorus: 3.5 mg/dL (ref 2.5–4.6)
Potassium: 4.9 mmol/L (ref 3.5–5.1)
Sodium: 140 mmol/L (ref 135–145)

## 2024-07-23 LAB — TYPE AND SCREEN
ABO/RH(D): O POS
Antibody Screen: NEGATIVE
Unit division: 0

## 2024-07-23 LAB — GLUCOSE, CAPILLARY
Glucose-Capillary: 170 mg/dL — ABNORMAL HIGH (ref 70–99)
Glucose-Capillary: 167 mg/dL — ABNORMAL HIGH (ref 70–99)
Glucose-Capillary: 174 mg/dL — ABNORMAL HIGH (ref 70–99)
Glucose-Capillary: 239 mg/dL — ABNORMAL HIGH (ref 70–99)

## 2024-07-23 LAB — BPAM RBC
Blood Product Expiration Date: 202601152359
ISSUE DATE / TIME: 202512191016
Unit Type and Rh: 5100

## 2024-07-23 MED ORDER — AMLODIPINE BESYLATE 5 MG PO TABS
5.0000 mg | ORAL_TABLET | Freq: Every day | ORAL | Status: DC
  Administered 2024-07-24 – 2024-07-26 (×3): 5 mg via ORAL
  Filled 2024-07-23 (×3): qty 1

## 2024-07-23 NOTE — Plan of Care (Signed)
   Problem: Activity: Goal: Risk for activity intolerance will decrease Outcome: Progressing   Problem: Nutrition: Goal: Adequate nutrition will be maintained Outcome: Progressing   Problem: Elimination: Goal: Will not experience complications related to bowel motility Outcome: Progressing Goal: Will not experience complications related to urinary retention Outcome: Progressing

## 2024-07-23 NOTE — Progress Notes (Signed)
 Mobility Specialist Progress Note:    07/23/24 0949  Mobility  Activity Ambulated with assistance  Level of Assistance Contact guard assist, steadying assist  Assistive Device Front wheel walker  Distance Ambulated (ft) 15 ft  Range of Motion/Exercises Active  Activity Response Tolerated fair  Mobility Referral Yes  Mobility visit 1 Mobility  Mobility Specialist Start Time (ACUTE ONLY) 0949  Mobility Specialist Stop Time (ACUTE ONLY) 1003  Mobility Specialist Time Calculation (min) (ACUTE ONLY) 14 min   Received pt laying in bed agreeable to session. Pt somewhat dizzy towards end of session but otherwise no c/o any symptoms. Pt able to move and ambulate w/ CGA assist. Returned pt to bed w/ all needs met.   Venetia Keel Mobility Specialist Please Neurosurgeon or Rehab Office at [phone]

## 2024-07-23 NOTE — Progress Notes (Signed)
 " PROGRESS NOTE    Alison Whitehead  FMW:[id number] DOB: 30-Jun-1951 DOA: 07/12/2024 PCP: Bridgette Sluder, PA    Brief Narrative:  Patient is a 73 yo F w/ pertinent PMH CKD 3b, HTN, HLD, prediabetes presents to [facility] on 12/9 with AMS.  Found to have acute kidney injury and underwent CRRT and bicarb drip.  Patient's creatinine seems to be back to baseline her mental status has improved but not back to baseline.  Insurance denied CIR, so now plan is to go to skilled nursing facility.   Assessment and Plan:  AKI on CKD 3B, likely due to ischemic ATN from hypovolemic shock Hypocalcemia Patient presented with creatinine 16.1 on admission, her baseline serum creatinine 1.73 with GFR 02 Jan 2023 Remain off CRRT and nephrology has signed off - Foley removed -Creatinine remains stable   Shock, likely hypovolemic, resolved Sepsis was ruled out Cultures remain negative Patient is off vasopressors White count remained within normal limits   Hyperkalemia -lokelma   x 1 -Resolved  Acute encephalopathy, likely due to uremia -Appear back to baseline - Will slowly wean off Seroquel  that was started in the ICU  Low phosphorus - Repleted  Thrombocytopenia, unclear etiology -Resolved   HTN/HLD Continue statin - Start to resume blood pressure meds as tolerates   Prediabetes Patient's hemoglobin A1c is 5.8 Continue sliding scale insulin  with CBG goal 140-180   Anemia of chronic disease -no signs of active bleeding - Status post 1 unit PRBCs on 12/19   Obesity Estimated body mass index is 38.56 kg/m as calculated from the following:   Height as of this encounter: 5' 6 (1.676 m).   Weight as of this encounter: 108.4 kg.    Foreign body in colon: - Discussed with PCCM-does not appear to be causing her issues and should pass on own  Await SNF placement   DVT prophylaxis: heparin  injection 5,000 Units Start: 07/13/24 0600    Code Status: Full Code   Disposition Plan:  Level  of care: Med-Surg Status is: Inpatient     Consultants:  Renal PCCM   Subjective: Some dizziness this morning when walking, orthostatics negative  Objective: Vitals:   07/22/24 1913 07/23/24 0124 07/23/24 0441 07/23/24 0745  BP: (!) 126/99 136/84 120/86 109/74  Pulse: 91 87  (!) 47  Resp: 20 16 18 19   Temp: 98.2 F (36.8 C) 97.7 F (36.5 C) 98 F (36.7 C) 98 F (36.7 C)  TempSrc: Oral Oral Oral Oral  SpO2: 99% 100% 97% (!) 76%  Weight:   108.4 kg   Height:        Intake/Output Summary (Last 24 hours) at 07/23/2024 1118 Last data filed at 07/23/2024 0400 Gross per 24 hour  Intake 1112 ml  Output --  Net 1112 ml   Filed Weights   07/20/24 0444 07/22/24 0430 07/23/24 0441  Weight: 107.7 kg 109 kg 108.4 kg    Examination:   General: Appearance:    Obese female in no acute distress     Lungs:     respirations unlabored  Heart:    Bradycardic. .    MS:   All extremities are intact.    Neurologic:   Awake, alert       Data Reviewed: I have personally reviewed following labs and imaging studies  CBC: Recent Labs  Lab 07/16/24 1520 07/17/24 0546 07/18/24 0545 07/19/24 0509 07/21/24 0451 07/22/24 0604 07/22/24 1842  WBC 7.9 7.6 6.9 6.6 7.8 7.9  --   NEUTROABS 4.4 4.1  --   --   --   --   --  HGB 8.0* 8.0* 7.8* 7.7* 7.0* 7.1* 8.5*  HCT 26.4* 25.7* 25.7* 26.4* 24.1* 24.2* 27.3*  MCV 88.0 86.0 88.0 90.1 93.1 91.3  --   PLT 186 181 214 252 257 306  --    Basic Metabolic Panel: Recent Labs  Lab 07/17/24 0546 07/18/24 0545 07/19/24 0509 07/20/24 0530 07/21/24 0451 07/22/24 0604 07/23/24 0450  NA 142   < > 141 141 140 138 140  K 3.9   < > 4.4 4.8 5.2* 5.1 4.9  CL 104   < > 108 108 108 106 105  CO2 28   < > 27 26 24 23 24   GLUCOSE 112*   < > 146* 225* 145* 235* 136*  BUN 28*   < > 33* 34* 31* 30* 30*  CREATININE 1.66*   < > 1.80* 1.56* 1.43* 1.37* 1.38*  CALCIUM  9.0   < > 8.6* 9.1 9.1 9.1 9.3  MG 1.8  --  1.8 1.9  --   --   --   PHOS 1.7*    < > 3.1 2.4* 2.6 2.9 3.5   < > = values in this interval not displayed.   GFR: Estimated Creatinine Clearance: 45.9 mL/min (A) (by C-G formula based on SCr of 1.38 mg/dL (H)). Liver Function Tests: Recent Labs  Lab 07/19/24 0509 07/20/24 0530 07/21/24 0451 07/22/24 0604 07/23/24 0450  ALBUMIN  3.2* 3.7 3.7 3.9 4.1   No results for input(s): LIPASE, AMYLASE in the last 168 hours.  No results for input(s): AMMONIA in the last 168 hours.  Coagulation Profile: No results for input(s): INR, PROTIME in the last 168 hours.  Cardiac Enzymes: No results for input(s): CKTOTAL, CKMB, CKMBINDEX, TROPONINI in the last 168 hours. BNP (last 3 results) No results for input(s): PROBNP in the last 8760 hours. HbA1C: No results for input(s): HGBA1C in the last 72 hours. CBG: Recent Labs  Lab 07/22/24 0758 07/22/24 1222 07/22/24 1642 07/22/24 2144 07/23/24 0740  GLUCAP 297* 155* 146* 162* 170*   Lipid Profile: No results for input(s): CHOL, HDL, LDLCALC, TRIG, CHOLHDL, LDLDIRECT in the last 72 hours. Thyroid Function Tests: No results for input(s): TSH, T4TOTAL, FREET4, T3FREE, THYROIDAB in the last 72 hours. Anemia Panel: No results for input(s): VITAMINB12, FOLATE, FERRITIN, TIBC, IRON, RETICCTPCT in the last 72 hours.  Sepsis Labs: No results for input(s): PROCALCITON, LATICACIDVEN in the last 168 hours.   No results found for this or any previous visit (from the past 240 hours).        Radiology Studies: No results found.      Scheduled Meds:  amLODipine   10 mg Oral Daily   aspirin  EC  81 mg Oral Daily   atorvastatin   40 mg Oral Daily   carvedilol   6.25 mg Oral BID   Chlorhexidine  Gluconate Cloth  6 each Topical Daily   dapagliflozin  propanediol  10 mg Oral Daily   feeding supplement (GLUCERNA SHAKE)  237 mL Oral TID BM   ferrous sulfate   325 mg Oral Once per day on Monday Wednesday Friday   heparin    5,000 Units Subcutaneous Q8H   insulin  aspart  0-15 Units Subcutaneous TID WC   lipase/protease/amylase  36,000 Units Oral BID AC   multivitamin  1 tablet Oral QHS   pantoprazole   40 mg Oral Daily   QUEtiapine   25 mg Oral QHS   Continuous Infusions:     LOS: 11 days    Time spent: 45 minutes spent on chart review, discussion with nursing  staff, consultants, updating family and interview/physical exam; more than 50% of that time was spent in counseling and/or coordination of care.    Harlene RAYMOND Bowl, DO [facility] Available via Epic secure chat 7am-7pm After these hours, please refer to coverage provider listed on [email or web address] 07/23/2024, 11:18 AM   "

## 2024-07-23 NOTE — Plan of Care (Signed)
°  Problem: Education: Goal: Knowledge of General Education information will improve Description: Including pain rating scale, medication(s)/side effects and non-pharmacologic comfort measures Outcome: Progressing   Problem: Health Behavior/Discharge Planning: Goal: Ability to manage health-related needs will improve Outcome: Progressing   Problem: Clinical Measurements: Goal: Ability to maintain clinical measurements within normal limits will improve Outcome: Progressing Goal: Will remain free from infection Outcome: Progressing Goal: Diagnostic test results will improve Outcome: Progressing Goal: Respiratory complications will improve Outcome: Progressing Goal: Cardiovascular complication will be avoided Outcome: Progressing   Problem: Activity: Goal: Risk for activity intolerance will decrease Outcome: Progressing   Problem: Nutrition: Goal: Adequate nutrition will be maintained Outcome: Progressing   Problem: Coping: Goal: Level of anxiety will decrease Outcome: Progressing   Problem: Elimination: Goal: Will not experience complications related to bowel motility Outcome: Progressing Goal: Will not experience complications related to urinary retention Outcome: Progressing   Problem: Pain Managment: Goal: General experience of comfort will improve and/or be controlled Outcome: Progressing   Problem: Safety: Goal: Ability to remain free from injury will improve Outcome: Progressing   Problem: Skin Integrity: Goal: Risk for impaired skin integrity will decrease Outcome: Progressing   Problem: Clinical Measurements: Goal: Complications related to the disease process or treatment will be avoided or minimized Outcome: Progressing   Problem: Education: Goal: Ability to describe self-care measures that may prevent or decrease complications (Diabetes Survival Skills Education) will improve Outcome: Progressing Goal: Individualized Educational Video(s) Outcome:  Progressing   Problem: Coping: Goal: Ability to adjust to condition or change in health will improve Outcome: Progressing   Problem: Fluid Volume: Goal: Ability to maintain a balanced intake and output will improve Outcome: Progressing   Problem: Health Behavior/Discharge Planning: Goal: Ability to identify and utilize available resources and services will improve Outcome: Progressing Goal: Ability to manage health-related needs will improve Outcome: Progressing   Problem: Metabolic: Goal: Ability to maintain appropriate glucose levels will improve Outcome: Progressing   Problem: Nutritional: Goal: Maintenance of adequate nutrition will improve Outcome: Progressing Goal: Progress toward achieving an optimal weight will improve Outcome: Progressing   Problem: Skin Integrity: Goal: Risk for impaired skin integrity will decrease Outcome: Progressing   Problem: Tissue Perfusion: Goal: Adequacy of tissue perfusion will improve Outcome: Progressing

## 2024-07-24 DIAGNOSIS — N179 Acute kidney failure, unspecified: Secondary | ICD-10-CM | POA: Diagnosis not present

## 2024-07-24 LAB — CBC
HCT: 27.2 % — ABNORMAL LOW (ref 36.0–46.0)
Hemoglobin: 8.1 g/dL — ABNORMAL LOW (ref 12.0–15.0)
MCH: 26.7 pg (ref 26.0–34.0)
MCHC: 29.8 g/dL — ABNORMAL LOW (ref 30.0–36.0)
MCV: 89.8 fL (ref 80.0–100.0)
Platelets: 278 K/uL (ref 150–400)
RBC: 3.03 MIL/uL — ABNORMAL LOW (ref 3.87–5.11)
RDW: 18.1 % — ABNORMAL HIGH (ref 11.5–15.5)
WBC: 8.5 K/uL (ref 4.0–10.5)
nRBC: 0.2 % (ref 0.0–0.2)

## 2024-07-24 LAB — RENAL FUNCTION PANEL
Albumin: 3.8 g/dL (ref 3.5–5.0)
Anion gap: 9 (ref 5–15)
BUN: 35 mg/dL — ABNORMAL HIGH (ref 8–23)
CO2: 24 mmol/L (ref 22–32)
Calcium: 9 mg/dL (ref 8.9–10.3)
Chloride: 106 mmol/L (ref 98–111)
Creatinine, Ser: 1.57 mg/dL — ABNORMAL HIGH (ref 0.44–1.00)
GFR, Estimated: 35 mL/min — ABNORMAL LOW
Glucose, Bld: 188 mg/dL — ABNORMAL HIGH (ref 70–99)
Phosphorus: 3.5 mg/dL (ref 2.5–4.6)
Potassium: 5.3 mmol/L — ABNORMAL HIGH (ref 3.5–5.1)
Sodium: 139 mmol/L (ref 135–145)

## 2024-07-24 LAB — GLUCOSE, CAPILLARY
Glucose-Capillary: 179 mg/dL — ABNORMAL HIGH (ref 70–99)
Glucose-Capillary: 258 mg/dL — ABNORMAL HIGH (ref 70–99)
Glucose-Capillary: 214 mg/dL — ABNORMAL HIGH (ref 70–99)
Glucose-Capillary: 169 mg/dL — ABNORMAL HIGH (ref 70–99)

## 2024-07-24 NOTE — Plan of Care (Signed)
°  Problem: Education: Goal: Knowledge of General Education information will improve Description: Including pain rating scale, medication(s)/side effects and non-pharmacologic comfort measures Outcome: Progressing   Problem: Health Behavior/Discharge Planning: Goal: Ability to manage health-related needs will improve Outcome: Progressing   Problem: Clinical Measurements: Goal: Ability to maintain clinical measurements within normal limits will improve Outcome: Progressing Goal: Will remain free from infection Outcome: Progressing Goal: Diagnostic test results will improve Outcome: Progressing Goal: Respiratory complications will improve Outcome: Progressing Goal: Cardiovascular complication will be avoided Outcome: Progressing   Problem: Activity: Goal: Risk for activity intolerance will decrease Outcome: Progressing   Problem: Nutrition: Goal: Adequate nutrition will be maintained Outcome: Progressing   Problem: Coping: Goal: Level of anxiety will decrease Outcome: Progressing   Problem: Elimination: Goal: Will not experience complications related to bowel motility Outcome: Progressing Goal: Will not experience complications related to urinary retention Outcome: Progressing   Problem: Pain Managment: Goal: General experience of comfort will improve and/or be controlled Outcome: Progressing   Problem: Safety: Goal: Ability to remain free from injury will improve Outcome: Progressing   Problem: Skin Integrity: Goal: Risk for impaired skin integrity will decrease Outcome: Progressing   Problem: Clinical Measurements: Goal: Complications related to the disease process or treatment will be avoided or minimized Outcome: Progressing   Problem: Education: Goal: Ability to describe self-care measures that may prevent or decrease complications (Diabetes Survival Skills Education) will improve Outcome: Progressing Goal: Individualized Educational Video(s) Outcome:  Progressing   Problem: Coping: Goal: Ability to adjust to condition or change in health will improve Outcome: Progressing   Problem: Fluid Volume: Goal: Ability to maintain a balanced intake and output will improve Outcome: Progressing   Problem: Health Behavior/Discharge Planning: Goal: Ability to identify and utilize available resources and services will improve Outcome: Progressing Goal: Ability to manage health-related needs will improve Outcome: Progressing   Problem: Metabolic: Goal: Ability to maintain appropriate glucose levels will improve Outcome: Progressing   Problem: Nutritional: Goal: Maintenance of adequate nutrition will improve Outcome: Progressing Goal: Progress toward achieving an optimal weight will improve Outcome: Progressing   Problem: Skin Integrity: Goal: Risk for impaired skin integrity will decrease Outcome: Progressing   Problem: Tissue Perfusion: Goal: Adequacy of tissue perfusion will improve Outcome: Progressing

## 2024-07-24 NOTE — Progress Notes (Signed)
 Mobility Specialist Progress Note:    07/24/24 1320  Mobility  Activity Ambulated with assistance  Level of Assistance Contact guard assist, steadying assist  Assistive Device Front wheel walker  Distance Ambulated (ft) 350 ft  Range of Motion/Exercises Active  Activity Response Tolerated fair  Mobility Referral Yes  Mobility visit 1 Mobility  Mobility Specialist Start Time (ACUTE ONLY) 1320  Mobility Specialist Stop Time (ACUTE ONLY) 1335  Mobility Specialist Time Calculation (min) (ACUTE ONLY) 15 min   Received pt laying in bed agreeable to session. No c/o any symptoms. Pt moving and ambulating decently but very impulsive and sudden needing constant cueing. Pt ambulate to bathroom before returning to EOB w/ all needs met.   Venetia Keel Mobility Specialist Please Neurosurgeon or Rehab Office at [phone]

## 2024-07-24 NOTE — Progress Notes (Signed)
 " PROGRESS NOTE    Alison Whitehead  FMW:[id number] DOB: 1951-05-27 DOA: 07/12/2024 PCP: Bridgette Sluder, PA   Brief Narrative:   73 yo F w/ pertinent PMH CKD 3b, HTN, HLD, prediabetes presents to [facility] on 12/9 with AMS.  Found to have acute kidney injury and underwent CRRT and bicarb drip.  Patient's creatinine seems to be back to baseline her mental status has improved but not back to baseline.  Insurance denied CIR.  Pending placement to [facility], awaiting insurance auth. Medically ready.  Assessment & Plan:  Principal Problem:   AKI (acute kidney injury) Active Problems:   Shock (HCC)   Debility   Metabolic encephalopathy   Primary osteoarthritis of right knee    AKI on CKD 3B, likely due to ischemic ATN from hypovolemic shock Hypocalcemia Patient presented with creatinine 16.1 on admission, her baseline serum creatinine 1.73 with GFR 02 Jan 2023 Remain off CRRT and nephrology has signed off Serum creatinine at baseline - Foley removed   Shock, likely hypovolemic, resolved Sepsis was ruled out Cultures remain negative Patient is off vasopressors White count remained within normal limits   Hyperkalemia -prn lokelma  -f/u K in am   Acute encephalopathy, likely due to uremia -Back to baseline - Will slowly wean off Seroquel  that was started in the ICU   Thrombocytopenia, unclear etiology -Resolved   HTN/HLD Continue statin - Start to resume blood pressure meds as tolerates   Prediabetes Patient's hemoglobin A1c is 5.8 Continue sliding scale insulin  with CBG goal 140-180   Anemia of chronic disease -no signs of active bleeding - Patient agreeable to transfusion as her hemoglobin is hanging in around 7   Class II Obesity,POA: Estimated body mass index is 38.77 kg/m as calculated from the following:   Height as of this encounter: 5' 6 (1.676 m).   Weight as of this encounter: 109 kg.   Likely a candidate for GLP-1 agonist therapy as an  outpatient.  Disposition: pending insurance auth. [facility].  DVT prophylaxis: heparin  injection 5,000 Units Start: 07/13/24 0600     Code Status: Full Code Family Communication:  None at the bedside Status is: Inpatient Remains inpatient appropriate because: Pending placement    Subjective:  Feels fine. We spoke about pending SNF, awaiting insurance auth. She was living home by herself prior to this hospitalization. She has one daughter in [place] and another one in [place].  Examination:  General exam: Appears calm and comfortable  Respiratory system: Clear to auscultation. Respiratory effort normal. Cardiovascular system: S1 & S2 heard, RRR. No JVD, murmurs, rubs, gallops or clicks. No pedal edema. Gastrointestinal system: Abdomen is nondistended, soft and nontender. No organomegaly or masses felt. Normal bowel sounds heard. Central nervous system: Alert and oriented. No focal neurological deficits. Extremities: Symmetric 5 x 5 power. Skin: No rashes, lesions or ulcers      Diet Orders (From admission, onward)     Start     Ordered   07/18/24 1414  Diet regular Room service appropriate? Yes with Assist; Fluid consistency: Thin  Diet effective now       Question Answer Comment  Room service appropriate? Yes with Assist   Fluid consistency: Thin      07/18/24 1414            Objective: Vitals:   07/23/24 2020 07/24/24 0500 07/24/24 0614 07/24/24 0842  BP: 139/87  129/83 (!) 138/95  Pulse: 96  85 97  Resp: 18  20 20   Temp: 98  F (36.7 C)  98 F (36.7 C) 97.8 F (36.6 C)  TempSrc: Oral  Oral Oral  SpO2: 95%  98% 100%  Weight:  107.7 kg    Height:        Intake/Output Summary (Last 24 hours) at 07/24/2024 1115 Last data filed at 07/24/2024 0615 Gross per 24 hour  Intake 240 ml  Output --  Net 240 ml   Filed Weights   07/22/24 0430 07/23/24 0441 07/24/24 0500  Weight: 109 kg 108.4 kg 107.7 kg    Scheduled Meds:  amLODipine   5 mg Oral  Daily   aspirin  EC  81 mg Oral Daily   atorvastatin   40 mg Oral Daily   carvedilol   6.25 mg Oral BID   dapagliflozin  propanediol  10 mg Oral Daily   feeding supplement (GLUCERNA SHAKE)  237 mL Oral TID BM   ferrous sulfate   325 mg Oral Once per day on Monday Wednesday Friday   heparin   5,000 Units Subcutaneous Q8H   insulin  aspart  0-15 Units Subcutaneous TID WC   lipase/protease/amylase  36,000 Units Oral BID AC   multivitamin  1 tablet Oral QHS   pantoprazole   40 mg Oral Daily   QUEtiapine   25 mg Oral QHS   Continuous Infusions:  Nutritional status Signs/Symptoms: NPO status Interventions: MVI, Glucerna shake Body mass index is 38.32 kg/m.  Data Reviewed:   CBC: Recent Labs  Lab 07/18/24 0545 07/19/24 0509 07/21/24 0451 07/22/24 0604 07/22/24 1842 07/24/24 0231  WBC 6.9 6.6 7.8 7.9  --  8.5  HGB 7.8* 7.7* 7.0* 7.1* 8.5* 8.1*  HCT 25.7* 26.4* 24.1* 24.2* 27.3* 27.2*  MCV 88.0 90.1 93.1 91.3  --  89.8  PLT 214 252 257 306  --  278   Basic Metabolic Panel: Recent Labs  Lab 07/19/24 0509 07/20/24 0530 07/21/24 0451 07/22/24 0604 07/23/24 0450 07/24/24 0231  NA 141 141 140 138 140 139  K 4.4 4.8 5.2* 5.1 4.9 5.3*  CL 108 108 108 106 105 106  CO2 27 26 24 23 24 24   GLUCOSE 146* 225* 145* 235* 136* 188*  BUN 33* 34* 31* 30* 30* 35*  CREATININE 1.80* 1.56* 1.43* 1.37* 1.38* 1.57*  CALCIUM  8.6* 9.1 9.1 9.1 9.3 9.0  MG 1.8 1.9  --   --   --   --   PHOS 3.1 2.4* 2.6 2.9 3.5 3.5   GFR: Estimated Creatinine Clearance: 40.2 mL/min (A) (by C-G formula based on SCr of 1.57 mg/dL (H)). Liver Function Tests: Recent Labs  Lab 07/20/24 0530 07/21/24 0451 07/22/24 0604 07/23/24 0450 07/24/24 0231  ALBUMIN  3.7 3.7 3.9 4.1 3.8   No results for input(s): LIPASE, AMYLASE in the last 168 hours. No results for input(s): AMMONIA in the last 168 hours. Coagulation Profile: No results for input(s): INR, PROTIME in the last 168 hours. Cardiac Enzymes: No  results for input(s): CKTOTAL, CKMB, CKMBINDEX, TROPONINI in the last 168 hours. BNP (last 3 results) No results for input(s): PROBNP in the last 8760 hours. HbA1C: No results for input(s): HGBA1C in the last 72 hours. CBG: Recent Labs  Lab 07/23/24 0740 07/23/24 1133 07/23/24 1559 07/23/24 2103 07/24/24 0733  GLUCAP 170* 167* 174* 239* 179*   Lipid Profile: No results for input(s): CHOL, HDL, LDLCALC, TRIG, CHOLHDL, LDLDIRECT in the last 72 hours. Thyroid Function Tests: No results for input(s): TSH, T4TOTAL, FREET4, T3FREE, THYROIDAB in the last 72 hours. Anemia Panel: No results for input(s): VITAMINB12, FOLATE, FERRITIN, TIBC, IRON,  RETICCTPCT in the last 72 hours. Sepsis Labs: No results for input(s): PROCALCITON, LATICACIDVEN in the last 168 hours.  No results found for this or any previous visit (from the past 240 hours).       Radiology Studies: No results found.         LOS: 12 days   Time spent= 35 mins    Deliliah Room, MD [facility]  If 7PM-7AM, please contact night-coverage  07/24/2024, 11:15 AM  "

## 2024-07-24 NOTE — Plan of Care (Signed)

## 2024-07-25 DIAGNOSIS — N179 Acute kidney failure, unspecified: Secondary | ICD-10-CM | POA: Diagnosis not present

## 2024-07-25 LAB — GLUCOSE, CAPILLARY
Glucose-Capillary: 181 mg/dL — ABNORMAL HIGH (ref 70–99)
Glucose-Capillary: 180 mg/dL — ABNORMAL HIGH (ref 70–99)
Glucose-Capillary: 199 mg/dL — ABNORMAL HIGH (ref 70–99)
Glucose-Capillary: 209 mg/dL — ABNORMAL HIGH (ref 70–99)

## 2024-07-25 LAB — RENAL FUNCTION PANEL
Albumin: 4.1 g/dL (ref 3.5–5.0)
Anion gap: 11 (ref 5–15)
BUN: 31 mg/dL — ABNORMAL HIGH (ref 8–23)
CO2: 24 mmol/L (ref 22–32)
Calcium: 9.4 mg/dL (ref 8.9–10.3)
Chloride: 105 mmol/L (ref 98–111)
Creatinine, Ser: 1.35 mg/dL — ABNORMAL HIGH (ref 0.44–1.00)
GFR, Estimated: 42 mL/min — ABNORMAL LOW
Glucose, Bld: 177 mg/dL — ABNORMAL HIGH (ref 70–99)
Phosphorus: 3.7 mg/dL (ref 2.5–4.6)
Potassium: 4.7 mmol/L (ref 3.5–5.1)
Sodium: 140 mmol/L (ref 135–145)

## 2024-07-25 NOTE — Progress Notes (Signed)
 Physical Therapy Treatment Patient Details Name: Alison Whitehead MRN: [id number] DOB: May 05, 1951 Today's Date: 07/25/2024   History of Present Illness Patient is a 73y.o. female admitted on 07/12/24 after being found with AMS by her daughter. She was found to have AKI requiring CRRT and bicarb drip. PMHx: CKD stage 3, HTN, HLD, and prediabetes.    PT Comments  Pt tolerated session well, progressing distance ambulated, with AD and no physical assistance, but continues to be impulsive throughout session. Pt attempting to rest over walker and navigates turns quickly, bumping into walker requiring cueing for safety throughout session. PT will continue to treat pt while she is admitted. Given pt's significant level of impulsivity and lack of caregiver support at discharge, continuing to recommend skilled therapy <3 hours per day for improving mobility, optimize balance and reducing risk of falls as pt is at increased risk.    If plan is discharge home, recommend the following: A little help with walking and/or transfers;A little help with bathing/dressing/bathroom;Assist for transportation;Help with stairs or ramp for entrance   Can travel by private vehicle     Yes  Equipment Recommendations  None recommended by PT    Recommendations for Other Services       Precautions / Restrictions Precautions Precautions: Fall Recall of Precautions/Restrictions: Intact Restrictions Weight Bearing Restrictions Per Provider Order: No     Mobility  Bed Mobility Overal bed mobility: Needs Assistance Bed Mobility: Supine to Sit     Supine to sit: Supervision, HOB elevated     General bed mobility comments: increased time to complete    Transfers Overall transfer level: Needs assistance Equipment used: Rolling walker (2 wheels) Transfers: Sit to/from Stand Sit to Stand: Supervision           General transfer comment: increased time to complete    Ambulation/Gait Ambulation/Gait  assistance: Contact guard assist Gait Distance (Feet): 250 Feet Assistive device: Rolling walker (2 wheels) Gait Pattern/deviations: Step-through pattern, Decreased stride length, Decreased weight shift to right, Decreased stance time - right, Trunk flexed, Wide base of support Gait velocity: reduced Gait velocity interpretation: <1.8 ft/sec, indicate of risk for recurrent falls   General Gait Details: Pt ambulates with reciprocal gait pattern and reduced cadence with increased reliance on RW to offload RLE secondary to pain. Pt with several instances of leaning over walker resting due to fatigue and then quickly popping back up to continue with bout of ambulation   Stairs             Wheelchair Mobility     Tilt Bed    Modified Rankin (Stroke Patients Only)       Balance Overall balance assessment: Needs assistance Sitting-balance support: No upper extremity supported, Feet supported Sitting balance-Leahy Scale: Good Sitting balance - Comments: seated EOB   Standing balance support: Bilateral upper extremity supported, Reliant on assistive device for balance, During functional activity Standing balance-Leahy Scale: Poor Standing balance comment: reliant on UE support                            Communication Communication Communication: No apparent difficulties  Cognition Arousal: Alert Behavior During Therapy: WFL for tasks assessed/performed   PT - Cognitive impairments: Safety/Judgement                       PT - Cognition Comments: Pt impuslive throughout session, continuing to lean on walker when feeling fatigued and quickly performing pivots.  Following commands: Intact      Cueing Cueing Techniques: Verbal cues  Exercises      General Comments General comments (skin integrity, edema, etc.): Following prolonged bout of ambulation SpO2 88% on RA      Pertinent Vitals/Pain Pain Assessment Pain Assessment: Faces Faces Pain Scale:  Hurts little more Pain Location: bilateral knees (R>L) Pain Descriptors / Indicators: Discomfort, Grimacing, Guarding, Sore Pain Intervention(s): Limited activity within patient's tolerance, Monitored during session    Home Living                          Prior Function            PT Goals (current goals can now be found in the care plan section) Acute Rehab PT Goals Patient Stated Goal: get my strength back Progress towards PT goals: Progressing toward goals    Frequency    Min 2X/week      PT Plan      Co-evaluation              AM-PAC PT 6 Clicks Mobility   Outcome Measure  Help needed turning from your back to your side while in a flat bed without using bedrails?: A Little Help needed moving from lying on your back to sitting on the side of a flat bed without using bedrails?: A Little Help needed moving to and from a bed to a chair (including a wheelchair)?: A Little Help needed standing up from a chair using your arms (e.g., wheelchair or bedside chair)?: A Little Help needed to walk in hospital room?: A Little Help needed climbing 3-5 steps with a railing? : Total 6 Click Score: 16    End of Session   Activity Tolerance: Patient tolerated treatment well Patient left: in bed;with call bell/phone within reach;with bed alarm set Nurse Communication: Mobility status PT Visit Diagnosis: Unsteadiness on feet (R26.81);Other abnormalities of gait and mobility (R26.89);Muscle weakness (generalized) (M62.81);Difficulty in walking, not elsewhere classified (R26.2);Pain Pain - Right/Left: Right Pain - part of body: Knee     Time: 8964-8948 PT Time Calculation (min) (ACUTE ONLY): 16 min  Charges:    $Gait Training: 8-22 mins PT General Charges $$ ACUTE PT VISIT: 1 Visit                     [name] DPT Acute Rehab Services [phone] Prefer contact via chat    Leontine NOVAK Jeffrey Graefe 07/25/2024, 10:59 AM

## 2024-07-25 NOTE — TOC Progression Note (Signed)
 Transition of Care [facility]) - Progression Note    Patient Details  Name: Alison Whitehead MRN: [id number] Date of Birth: 1951-05-04  [facility] CM/SW Contact  Luann SHAUNNA [place], [place] Phone Number: 07/25/2024, 3:44 PM  Clinical Narrative:     SNF auth approved. Cert # [id number]. 07/25/2024-12/28/22025   [facility] can admit pt tomorrow.   Expected Discharge Plan: Skilled Nursing Facility Barriers to Discharge: Insurance Authorization               Expected Discharge Plan and Services In-house Referral: NA Discharge Planning Services: CM Consult Post Acute Care Choice: IP Rehab Living arrangements for the past 2 months: Apartment                                       Social Drivers of Health (SDOH) Interventions SDOH Screenings   Food Insecurity: No Food Insecurity (07/18/2024)  Housing: Low Risk (07/18/2024)  Transportation Needs: No Transportation Needs (07/18/2024)  Utilities: Not At Risk (07/18/2024)  Depression (PHQ2-9): Low Risk (07/02/2022)  Social Connections: Moderately Isolated (07/18/2024)  Tobacco Use: Medium Risk (07/18/2024)    Readmission Risk Interventions    12/28/2022    1:24 PM  Readmission Risk Prevention Plan  Post Dischage Appt Complete  Medication Screening Not Complete  Medication Screening Not Complete Comment N/A  Transportation Screening Not Complete  Transportation Screening Comment N/A

## 2024-07-25 NOTE — Progress Notes (Signed)
 Occupational Therapy Treatment Patient Details Name: Alison Whitehead MRN: [id number] DOB: 1951-04-05 Today's Date: 07/25/2024   History of present illness Patient is a 73y.o. female admitted on 07/12/24 after being found with AMS by her daughter. She was found to have AKI requiring CRRT and bicarb drip. PMHx: CKD stage 3, HTN, HLD, and prediabetes.   OT comments  Pt. Seen for skilled OT treatment session.  Able to complete bed mobility in/out with S.  Ambulation to/from b.room and sink for toileting and standing grooming task with S and heavy cues for RW management and safety.  Pt. Cont. To push RW to the side when approaching surfaces.  Reviewed and demonstrated proper RW tech. Pt. Able to verbalize understanding of recommendations.  Cues for PLB following ambulation and pt. With notable SOB. Pt. Stating she has her own way she likes to catch her breath.  Cont. With acute OT POC.        If plan is discharge home, recommend the following:      Equipment Recommendations       Recommendations for Other Services      Precautions / Restrictions Precautions Precautions: Fall Recall of Precautions/Restrictions: Intact       Mobility Bed Mobility Overal bed mobility: Needs Assistance Bed Mobility: Supine to Sit, Sit to Supine     Supine to sit: Supervision, HOB elevated Sit to supine: Supervision   General bed mobility comments: educated on sitting down closer to hob so easier to get into bed and limit need for repositioning once laying down    Transfers Overall transfer level: Needs assistance Equipment used: Rolling walker (2 wheels) Transfers: Sit to/from Stand, Bed to chair/wheelchair/BSC Sit to Stand: Supervision     Step pivot transfers: Supervision     General transfer comment: cues for RW management and safety     Balance                                           ADL either performed or assessed with clinical judgement   ADL Overall  ADL's : Needs assistance/impaired     Grooming: Standing;Wash/dry hands;Supervision/safety Grooming Details (indicate cue type and reason): cues and education provided on RW management, staying inside the RW vs. pushing it to the side while in standing at a surface                 Toilet Transfer: Contact guard assist;Ambulation;Regular Toilet;Rolling walker (2 wheels);Grab bars;Supervision/safety Toilet Transfer Details (indicate cue type and reason): toilet seat extremely low. CGA provided for stand to sit although pt able to use grab bars and stand up with SBA.-reviewed with RN placing 3n1 over the commode to assist with limiting the lower toilet esp due to strain on pts. knees Toileting- Clothing Manipulation and Hygiene: Supervision/safety;Sit to/from stand       Functional mobility during ADLs: Contact guard assist;Cueing for safety;Rolling walker (2 wheels) General ADL Comments: reviewed RW management when approaching surfaces NOT to push to the side in standing or when backing up to the bed    Extremity/Trunk Assessment              Vision       Perception     Praxis     Communication Communication Communication: No apparent difficulties Factors Affecting Communication: Hearing impaired   Cognition Arousal: Alert Behavior During Therapy: [facility] for tasks assessed/performed Cognition: No  apparent impairments             OT - Cognition Comments: Able to follow all commands.                 Following commands: Intact        Cueing   Cueing Techniques: Verbal cues  Exercises      Shoulder Instructions       General Comments      Pertinent Vitals/ Pain       Pain Assessment Pain Assessment: Faces Faces Pain Scale: Hurts little more Pain Location: B knees Pain Descriptors / Indicators: Discomfort, Grimacing, Guarding, Sore Pain Intervention(s): Limited activity within patient's tolerance, Repositioned  Home Living                                           Prior Functioning/Environment              Frequency           Progress Toward Goals  OT Goals(current goals can now be found in the care plan section)  Progress towards OT goals: Progressing toward goals     Plan      Co-evaluation                 AM-PAC OT 6 Clicks Daily Activity     Outcome Measure                    End of Session Equipment Utilized During Treatment: Rolling walker (2 wheels);Gait belt      Activity Tolerance Patient tolerated treatment well   Patient Left in bed;with call bell/phone within reach   Nurse Communication Other (comment);Mobility status-reviewed with RN rec. For 3n1 over commode secondary to low toilet and pt. With c/o B knee pain esp. Coming from that low of a surface. Rn states she will set it up for the pt.  Also reviewed rec. For bed alarm, pt. Reporting she has been ambulating in room w/o staff assistance. Pt. With notable SOB during ambulation and requiring cues for RW management.  Rn in agreement and stated she was going to set bed alarm when she went back in to set 3n1 over commode.          Time: 8742-8692 OT Time Calculation (min): 10 min  Charges: OT General Charges $OT Visit: 1 Visit OT Treatments $Self Care/Home Management : 8-22 mins  Randall, COTA/L Acute Rehabilitation [phone]   CHRISTELLA Nest Lorraine-COTA/L  07/25/2024, 2:30 PM

## 2024-07-25 NOTE — Progress Notes (Signed)
 " PROGRESS NOTE    Alison Whitehead  FMW:[id number] DOB: 14-Nov-1950 DOA: 07/12/2024 PCP: Bridgette Sluder, PA   Brief Narrative:   73 yo F w/ pertinent PMH CKD 3b, HTN, HLD, prediabetes presents to [facility] on 12/9 with AMS.  Found to have acute kidney injury and underwent CRRT and bicarb drip.  Patient's creatinine seems to be back to baseline her mental status has improved but not back to baseline.  Insurance denied CIR.  Pending placement to [facility], awaiting insurance auth. Medically ready.  Assessment & Plan:  Principal Problem:   AKI (acute kidney injury) Active Problems:   Shock (HCC)   Debility   Metabolic encephalopathy   Primary osteoarthritis of right knee    AKI on CKD 3B, likely due to ischemic ATN from hypovolemic shock Hypocalcemia Patient presented with creatinine 16.1 on admission, her baseline serum creatinine 1.73 with GFR 02 Jan 2023 Remain off CRRT and nephrology has signed off Serum creatinine at baseline - Foley removed   Shock, likely hypovolemic, resolved Sepsis was ruled out Cultures remain negative Patient is off vasopressors White count remained within normal limits   Hyperkalemia -prn lokelma  -f/u K in am   Acute encephalopathy, likely due to uremia -Back to baseline - Will slowly wean off Seroquel  that was started in the ICU   Thrombocytopenia, unclear etiology -Resolved   HTN/HLD Continue statin - Start to resume blood pressure meds as tolerates   Prediabetes Patient's hemoglobin A1c is 5.8 Continue sliding scale insulin  with CBG goal 140-180   Anemia of chronic disease -no signs of active bleeding - Patient agreeable to transfusion as her hemoglobin is hanging in around 7   Class II Obesity,POA: Estimated body mass index is 38.77 kg/m as calculated from the following:   Height as of this encounter: 5' 6 (1.676 m).   Weight as of this encounter: 109 kg.   Likely a candidate for GLP-1 agonist therapy as an  outpatient.  Disposition: pending insurance auth. [facility].  DVT prophylaxis: heparin  injection 5,000 Units Start: 07/13/24 0600     Code Status: Full Code Family Communication:  None at the bedside Status is: Inpatient Remains inpatient appropriate because: Pending placement    Subjective:  No acute events overnight. Awaiting SNF placement and pending insurance auth.  Examination:  General exam: Appears calm and comfortable  Respiratory system: Clear to auscultation. Respiratory effort normal. Cardiovascular system: S1 & S2 heard, RRR. No JVD, murmurs, rubs, gallops or clicks. No pedal edema. Gastrointestinal system: Abdomen is nondistended, soft and nontender. No organomegaly or masses felt. Normal bowel sounds heard. Central nervous system: Alert and oriented. No focal neurological deficits. Extremities: Symmetric 5 x 5 power. Skin: No rashes, lesions or ulcers      Diet Orders (From admission, onward)     Start     Ordered   07/18/24 1414  Diet regular Room service appropriate? Yes with Assist; Fluid consistency: Thin  Diet effective now       Question Answer Comment  Room service appropriate? Yes with Assist   Fluid consistency: Thin      07/18/24 1414            Objective: Vitals:   07/24/24 2320 07/25/24 0539 07/25/24 0753 07/25/24 1131  BP: 127/89 (!) 126/98 108/85 109/76  Pulse: 87 85 (!) 103 92  Resp: (!) 22 19 18 17   Temp: 97.7 F (36.5 C) 97.6 F (36.4 C) 97.8 F (36.6 C) 97.9 F (36.6 C)  TempSrc: Oral Oral  Oral Oral  SpO2:   90% 91%  Weight:  109 kg    Height:        Intake/Output Summary (Last 24 hours) at 07/25/2024 1135 Last data filed at 07/25/2024 0700 Gross per 24 hour  Intake 100 ml  Output --  Net 100 ml   Filed Weights   07/23/24 0441 07/24/24 0500 07/25/24 0539  Weight: 108.4 kg 107.7 kg 109 kg    Scheduled Meds:  amLODipine   5 mg Oral Daily   aspirin  EC  81 mg Oral Daily   atorvastatin   40 mg Oral Daily    carvedilol   6.25 mg Oral BID   dapagliflozin  propanediol  10 mg Oral Daily   feeding supplement (GLUCERNA SHAKE)  237 mL Oral TID BM   ferrous sulfate   325 mg Oral Once per day on Monday Wednesday Friday   heparin   5,000 Units Subcutaneous Q8H   insulin  aspart  0-15 Units Subcutaneous TID WC   lipase/protease/amylase  36,000 Units Oral BID AC   multivitamin  1 tablet Oral QHS   pantoprazole   40 mg Oral Daily   QUEtiapine   25 mg Oral QHS   Continuous Infusions:  Nutritional status Signs/Symptoms: NPO status Interventions: MVI, Glucerna shake Body mass index is 38.8 kg/m.  Data Reviewed:   CBC: Recent Labs  Lab 07/19/24 0509 07/21/24 0451 07/22/24 0604 07/22/24 1842 07/24/24 0231  WBC 6.6 7.8 7.9  --  8.5  HGB 7.7* 7.0* 7.1* 8.5* 8.1*  HCT 26.4* 24.1* 24.2* 27.3* 27.2*  MCV 90.1 93.1 91.3  --  89.8  PLT 252 257 306  --  278   Basic Metabolic Panel: Recent Labs  Lab 07/19/24 0509 07/20/24 0530 07/21/24 0451 07/22/24 0604 07/23/24 0450 07/24/24 0231 07/25/24 0549  NA 141 141 140 138 140 139 140  K 4.4 4.8 5.2* 5.1 4.9 5.3* 4.7  CL 108 108 108 106 105 106 105  CO2 27 26 24 23 24 24 24   GLUCOSE 146* 225* 145* 235* 136* 188* 177*  BUN 33* 34* 31* 30* 30* 35* 31*  CREATININE 1.80* 1.56* 1.43* 1.37* 1.38* 1.57* 1.35*  CALCIUM  8.6* 9.1 9.1 9.1 9.3 9.0 9.4  MG 1.8 1.9  --   --   --   --   --   PHOS 3.1 2.4* 2.6 2.9 3.5 3.5 3.7   GFR: Estimated Creatinine Clearance: 47.1 mL/min (A) (by C-G formula based on SCr of 1.35 mg/dL (H)). Liver Function Tests: Recent Labs  Lab 07/21/24 0451 07/22/24 0604 07/23/24 0450 07/24/24 0231 07/25/24 0549  ALBUMIN  3.7 3.9 4.1 3.8 4.1   No results for input(s): LIPASE, AMYLASE in the last 168 hours. No results for input(s): AMMONIA in the last 168 hours. Coagulation Profile: No results for input(s): INR, PROTIME in the last 168 hours. Cardiac Enzymes: No results for input(s): CKTOTAL, CKMB, CKMBINDEX,  TROPONINI in the last 168 hours. BNP (last 3 results) No results for input(s): PROBNP in the last 8760 hours. HbA1C: No results for input(s): HGBA1C in the last 72 hours. CBG: Recent Labs  Lab 07/24/24 1235 07/24/24 1638 07/24/24 2135 07/25/24 0736 07/25/24 1131  GLUCAP 258* 214* 169* 181* 180*   Lipid Profile: No results for input(s): CHOL, HDL, LDLCALC, TRIG, CHOLHDL, LDLDIRECT in the last 72 hours. Thyroid Function Tests: No results for input(s): TSH, T4TOTAL, FREET4, T3FREE, THYROIDAB in the last 72 hours. Anemia Panel: No results for input(s): VITAMINB12, FOLATE, FERRITIN, TIBC, IRON, RETICCTPCT in the last 72 hours. Sepsis Labs: No  results for input(s): PROCALCITON, LATICACIDVEN in the last 168 hours.  No results found for this or any previous visit (from the past 240 hours).       Radiology Studies: No results found.         LOS: 13 days   Time spent= 35 mins    Deliliah Room, MD [facility]  If 7PM-7AM, please contact night-coverage  07/25/2024, 11:35 AM  "

## 2024-07-25 NOTE — Care Management Important Message (Signed)
 Important Message  Patient Details  Name: Alison Whitehead MRN: [id number] Date of Birth: 01-01-1951   Important Message Given:  Yes - Medicare IM     Vonzell Arrie Sharps 07/25/2024, 12:18 PM

## 2024-07-25 NOTE — Plan of Care (Signed)

## 2024-07-25 NOTE — Progress Notes (Signed)
 SNF auth still pending. ICM will continue to follow.

## 2024-07-26 DIAGNOSIS — N179 Acute kidney failure, unspecified: Secondary | ICD-10-CM | POA: Diagnosis not present

## 2024-07-26 LAB — RENAL FUNCTION PANEL
Albumin: 4 g/dL (ref 3.5–5.0)
Anion gap: 11 (ref 5–15)
BUN: 33 mg/dL — ABNORMAL HIGH (ref 8–23)
CO2: 24 mmol/L (ref 22–32)
Calcium: 9.2 mg/dL (ref 8.9–10.3)
Chloride: 105 mmol/L (ref 98–111)
Creatinine, Ser: 1.56 mg/dL — ABNORMAL HIGH (ref 0.44–1.00)
GFR, Estimated: 35 mL/min — ABNORMAL LOW
Glucose, Bld: 168 mg/dL — ABNORMAL HIGH (ref 70–99)
Phosphorus: 3.8 mg/dL (ref 2.5–4.6)
Potassium: 4.7 mmol/L (ref 3.5–5.1)
Sodium: 139 mmol/L (ref 135–145)

## 2024-07-26 LAB — GLUCOSE, CAPILLARY: Glucose-Capillary: 233 mg/dL — ABNORMAL HIGH (ref 70–99)

## 2024-07-26 MED ORDER — DICYCLOMINE HCL 10 MG PO CAPS: 10.0000 mg | ORAL_CAPSULE | Freq: Four times a day (QID) | ORAL | Status: DC | PRN

## 2024-07-26 MED ORDER — AMLODIPINE BESYLATE 5 MG PO TABS: 5.0000 mg | ORAL_TABLET | Freq: Every day | ORAL | Status: DC

## 2024-07-26 MED ORDER — QUETIAPINE FUMARATE 25 MG PO TABS: 25.0000 mg | ORAL_TABLET | Freq: Every day | ORAL | Status: DC

## 2024-07-26 MED ORDER — RENA-VITE PO TABS: 1.0000 | ORAL_TABLET | Freq: Every day | ORAL | Status: DC

## 2024-07-26 MED ORDER — PANTOPRAZOLE SODIUM 40 MG PO TBEC: 40.0000 mg | DELAYED_RELEASE_TABLET | Freq: Every day | ORAL | Status: DC

## 2024-07-26 MED ORDER — HYDROCODONE-ACETAMINOPHEN 10-325 MG PO TABS: 1.0000 | ORAL_TABLET | Freq: Four times a day (QID) | ORAL | 0 refills | Status: DC | PRN

## 2024-07-26 MED ORDER — GLUCERNA SHAKE PO LIQD: 237.0000 mL | Freq: Three times a day (TID) | ORAL | Status: DC

## 2024-07-26 MED ORDER — CARVEDILOL 6.25 MG PO TABS: 6.2500 mg | ORAL_TABLET | Freq: Two times a day (BID) | ORAL | Status: AC

## 2024-07-26 NOTE — Discharge Summary (Addendum)
 " Physician Discharge Summary   Patient: Alison Whitehead MRN: [id number] DOB: 04/17/1951  Admit date:     07/12/2024  Discharge date: 07/26/2024  Discharge Physician: Deliliah Room   PCP: Bridgette Sluder, PA   Recommendations at discharge:    F/u with your PCP in one week. Continue taking meds as prescribed  Discharge Diagnoses: Principal Problem:   AKI (acute kidney injury) Active Problems:   Shock (HCC)   Debility   Metabolic encephalopathy   Primary osteoarthritis of right knee   Hospital Course:  73 yo F w/ pertinent PMH CKD 3b, HTN, HLD, prediabetes presents to [facility] on 12/9 with AMS. Found to have acute kidney injury and underwent CRRT and bicarb drip. Patient's creatinine seems to be back to baseline her mental status has improved but not back to baseline.   AKI on CKD 3B, likely due to ischemic ATN from hypovolemic shock Hypocalcemia Patient presented with creatinine 16.1 on admission, her baseline serum creatinine 1.73 with GFR 02 Jan 2023 Remain off CRRT and nephrology has signed off Serum creatinine at baseline - Foley removed   Shock, likely hypovolemic, resolved Sepsis was ruled out Cultures remain negative Patient is off vasopressors White count remained within normal limits   Hyperkalemia -resolved   Acute encephalopathy, likely due to uremia -Back to baseline - Dced seroquel    Thrombocytopenia, unclear etiology -Resolved   HTN/HLD Continue statin - Start to resume blood pressure meds as tolerates   Prediabetes Patient's hemoglobin A1c is 5.8 Continue with metformin    Anemia of chronic disease -no signs of active bleeding    Class II Obesity,POA: Estimated body mass index is 38.77 kg/m as calculated from the following:   Height as of this encounter: 5' 6 (1.676 m).   Weight as of this encounter: 109 kg.    Continue with Weekly Ozempic    [facility]       Consultants: Nephrology, Critical care Procedures  performed: CVC, arterial line, cortrack placements with subsequent removals  Disposition: Skilled nursing facility Diet recommendation:  Cardiac diet DISCHARGE MEDICATION: Allergies as of 07/26/2024       Reactions   Penicillins Itching        Medication List     STOP taking these medications    amLODipine -olmesartan  10-40 MG tablet Commonly known as: AZOR    torsemide 5 MG tablet Commonly known as: DEMADEX       TAKE these medications    amLODipine  5 MG tablet Commonly known as: NORVASC  Take 1 tablet (5 mg total) by mouth daily.   aspirin  EC 81 MG tablet Take 1 tablet (81 mg total) by mouth daily.   atorvastatin  40 MG tablet Commonly known as: LIPITOR Take 1 tablet (40 mg total) by mouth daily.   carvedilol  6.25 MG tablet Commonly known as: COREG  Take 1 tablet (6.25 mg total) by mouth 2 (two) times daily. What changed:  medication strength how much to take   Creon  36000-114000 units Cpep capsule Generic drug: lipase/protease/amylase Take 36,000 Units by mouth in the morning and at bedtime.   cyclobenzaprine  5 MG tablet Commonly known as: FLEXERIL  TAKE 1 TABLET BY MOUTH THREE TIMES A DAY AS NEEDED FOR MUSCLE SPASMS   dapagliflozin  propanediol 10 MG Tabs tablet Commonly known as: FARXIGA  Take 1 tablet (10 mg total) by mouth daily.   dicyclomine  10 MG capsule Commonly known as: BENTYL  Take 1-2 capsules (10-20 mg total) by mouth 4 (four) times daily as needed for spasms.   feeding supplement (GLUCERNA SHAKE)  Liqd Take 237 mLs by mouth 3 (three) times daily between meals.   ferrous sulfate  325 (65 FE) MG tablet Take 325 mg by mouth 3 (three) times a week.   HYDROcodone -acetaminophen  10-325 MG tablet Commonly known as: NORCO Take 1 tablet by mouth every 6 (six) hours as needed for severe pain (pain score 7-10). What changed: reasons to take this   Melatonin 5 MG Caps Take 5 mg by mouth at bedtime.   metFORMIN  1000 MG tablet Commonly known as:  GLUCOPHAGE  Take 1,000 mg by mouth every evening.   multivitamin Tabs tablet Take 1 tablet by mouth at bedtime.   naloxone 4 MG/0.1ML Liqd nasal spray kit Commonly known as: NARCAN SMARTSIG:Both Nares   Ozempic  (1 MG/DOSE) 4 MG/3ML Sopn Generic drug: Semaglutide  (1 MG/DOSE) Inject 1 mg into the skin once a week.   pantoprazole  40 MG tablet Commonly known as: PROTONIX  Take 1 tablet (40 mg total) by mouth daily.   QUEtiapine  25 MG tablet Commonly known as: SEROQUEL  Take 1 tablet (25 mg total) by mouth at bedtime.        Contact information for follow-up providers     Bridgette Sluder, [place]. Schedule an appointment as soon as possible for a visit in 1 week(s).   Specialty: Physician Assistant Contact information: [phone] [place] [address] [phone]              Contact information for after-discharge care     Destination     [place] [facility] [place], [place] .   Service: Skilled Nursing Contact information: [address] [place]  [address] [phone]                    Discharge Exam: Filed Weights   07/24/24 0500 07/25/24 0539 07/26/24 0552  Weight: 107.7 kg 109 kg 106.6 kg   Constitutional: NAD, calm, comfortable Eyes: PERRL, lids and conjunctivae normal ENMT: Mucous membranes are moist. Posterior pharynx clear of any exudate or lesions.Normal dentition.  Neck: normal, supple, no masses, no thyromegaly Respiratory: clear to auscultation bilaterally, no wheezing, no crackles. Normal respiratory effort. No accessory muscle use.  Cardiovascular: Regular rate and rhythm, no murmurs / rubs / gallops. No extremity edema. 2+ pedal pulses. No carotid bruits.  Abdomen: no tenderness, no masses palpated. No hepatosplenomegaly. Bowel sounds positive.  Musculoskeletal: no clubbing / cyanosis. No joint deformity upper and lower extremities. Good ROM, no contractures. Normal muscle tone.  Skin: no rashes, lesions,  ulcers. No induration Neurologic: CN 2-12 grossly intact. Sensation intact, DTR normal. Strength 5/5 x all 4 extremities.  Psychiatric: Normal judgment and insight. Alert and oriented x 3. Normal mood.    Condition at discharge: good  The results of significant diagnostics from this hospitalization (including imaging, microbiology, ancillary and laboratory) are listed below for reference.   Imaging Studies: CT ABDOMEN PELVIS WO CONTRAST Result Date: 07/13/2024 EXAM: CT ABDOMEN AND PELVIS WITHOUT CONTRAST 07/13/2024 08:14:00 AM TECHNIQUE: CT of the abdomen and pelvis was performed without the administration of intravenous contrast. Multiplanar reformatted images are provided for review. Automated exposure control, iterative reconstruction, and/or weight-based adjustment of the mA/kV was utilized to reduce the radiation dose to as low as reasonably achievable. COMPARISON: 09/30/2019 CLINICAL HISTORY: abd pain, ams FINDINGS: LOWER CHEST: Moderate cardiomegaly. Bilateral lower lobe airspace opacities primarily from atelectasis. LIVER: The liver is unremarkable. GALLBLADDER AND BILE DUCTS: Gallbladder is unremarkable. No biliary ductal dilatation. SPLEEN: No acute abnormality. PANCREAS: No acute abnormality. ADRENAL GLANDS:  No acute abnormality. KIDNEYS, URETERS AND BLADDER: 1.5 x 0.7 x 1.3 cm hyperdense lesion of the left mid kidney posteromedially on image 34 series 3, internal density 101 Hounsfield units, compatible with benign positive category 2 cyst. No further imaging workup of this lesion is indicated. Foley catheter is present in the urinary bladder. No hydronephrosis or hydroureter. Bilateral periureteral vascular calcifications but no definite intraureteral stone seen. GI AND BOWEL: Stomach demonstrates no acute abnormality. Sigmoid colon diverticulosis and scattered diverticula elsewhere in the colon. Vertically oriented 3.4 cm linear density in the sigmoid colon on image 86 series 7, not present  on prior exams, possibly intraluminal animal bone given the configuration. No associated obstruction or extraluminal gas although the orientation of the long axis of this structure is perpendicular to the axis of the sigmoid colon. There is no bowel obstruction. PERITONEUM AND RETROPERITONEUM: No ascites. No free air. VASCULATURE: Systemic atherosclerosis is present, including the aorta and iliac arteries. Aorta is normal in caliber. Right venous femoral line terminates in the right external iliac vein. Left femoral venous line likewise terminates in the common femoral vein. A smaller likely arterial left femoral vein extends distally into the profunda branch of the left femoral artery, with tip not visible. LYMPH NODES: No lymphadenopathy. REPRODUCTIVE ORGANS: Calcified uterine fibroids. BONES AND SOFT TISSUES: Supraumbilical hernia contains adipose tissue. 7 mm degenerative anterolisthesis at L4-L5. Lumbar spondylosis and degenerative disc disease cause moderate left foraminal impingement at L3-L4 and bilateral foraminal impingement at L4-L5. Moderate degenerative hip arthropathy bilaterally. No acute osseous abnormality. No focal soft tissue abnormality. IMPRESSION: 1. Vertically oriented 3.4 cm linear density in the sigmoid colon, possibly an ingested bone, without current obstruction or extraluminal gas. This structure is oriented perpendicular to the axis of the sigmoid colon. Recommend correlation with focal lower pelvis tenderness or GI bleeding ; if concern for retained sharp foreign body, consider surgical or endoscopic evaluation. 2. The left femoral arterial line appears to extend distally into the profunda femoris branch of the left femoral artery , tip not included. 3. Benign Bosniak II hemorrhagic/proteinaceous cyst in the left kidney; no follow-up imaging recommended. 4. Systemic atherosclerosis involving the aorta and iliac arteries. 5. Moderate cardiomegaly and bilateral lower lobe airspace  opacities likely from atelectasis. 6. Degenerative changes including 7 mm degenerative anterolisthesis at L4-5, lumbar spondylosis and degenerative disc disease with moderate left foraminal impingement at L3-4 and bilateral foraminal impingement at L4-5, and moderate bilateral hip arthropathy. 7. Additional chronic/incidental findings including Foley catheter within the urinary bladder, right femoral venous line terminating in the right external iliac vein, left femoral venous line terminating in the common femoral vein, sigmoid diverticulosis and scattered colonic diverticula, calcified uterine fibroids, and a supraumbilical fat-containing hernia. Electronically signed by: Ryan Salvage MD 07/13/2024 08:46 AM EST RP Workstation: [id number]   CT Head Wo Contrast Result Date: 07/13/2024 CLINICAL DATA:  Altered mental status. EXAM: CT HEAD WITHOUT CONTRAST TECHNIQUE: Contiguous axial images were obtained from the base of the skull through the vertex without intravenous contrast. RADIATION DOSE REDUCTION: This exam was performed according to the departmental dose-optimization program which includes automated exposure control, adjustment of the mA and/or kV according to patient size and/or use of iterative reconstruction technique. COMPARISON:  05/15/2020 FINDINGS: Brain: There is no evidence for acute hemorrhage, hydrocephalus, mass lesion, or abnormal extra-axial fluid collection. No definite CT evidence for acute infarction. Vascular: No hyperdense vessel or unexpected calcification. Skull: No evidence for fracture. No worrisome lytic or sclerotic lesion. Sinuses/Orbits: The visualized  paranasal sinuses and mastoid air cells are clear. Old medial right orbital wall fracture again noted. Other: None. IMPRESSION: No acute intracranial abnormality. Electronically Signed   By: Camellia Candle M.D.   On: 07/13/2024 08:40   US  RENAL Result Date: 07/12/2024 CLINICAL DATA:  Acute kidney injury EXAM: RENAL / URINARY  TRACT ULTRASOUND COMPLETE COMPARISON:  Ultrasound 11/22/2020 FINDINGS: Right Kidney: Renal measurements: 9.5 x 5.8 x 4.7 cm = volume: 134 mL. Echogenic cortex. No mass or hydronephrosis Left Kidney: Renal measurements: 8.4 x 4.9 x 4.5 cm = volume: 96.2 mL. Echogenic cortex. No mass or hydronephrosis. Bladder: Appears normal for degree of bladder distention. Other: None. IMPRESSION: Slightly small but symmetric kidneys with echogenic cortex consistent with medical renal disease and mild atrophy/scarring. No hydronephrosis Electronically Signed   By: Luke Bun M.D.   On: 07/12/2024 23:42   DG Chest Port 1 View Result Date: 07/12/2024 CLINICAL DATA:  Questionable sepsis. EXAM: PORTABLE CHEST 1 VIEW COMPARISON:  Chest radiograph dated 05/20/2016. FINDINGS: Cardiomegaly with vascular congestion. No focal consolidation, pleural effusion, or pneumothorax. No acute osseous pathology. IMPRESSION: Cardiomegaly with vascular congestion. No focal consolidation. Electronically Signed   By: Vanetta Chou M.D.   On: 07/12/2024 19:57    Microbiology: Results for orders placed or performed during the hospital encounter of 07/12/24  Blood Culture (routine x 2)     Status: None   Collection Time: 07/12/24  7:29 PM   Specimen: BLOOD RIGHT FOREARM  Result Value Ref Range Status   Specimen Description BLOOD RIGHT FOREARM  Final   Special Requests   Final    BOTTLES DRAWN AEROBIC AND ANAEROBIC Blood Culture adequate volume   Culture   Final    NO GROWTH 5 DAYS Performed at [facility], [address], [place], [place] [address]    Report Status 07/17/2024 FINAL  Final  Blood Culture (routine x 2)     Status: None   Collection Time: 07/12/24  7:29 PM   Specimen: BLOOD LEFT FOREARM  Result Value Ref Range Status   Specimen Description BLOOD LEFT FOREARM  Final   Special Requests   Final    BOTTLES DRAWN AEROBIC AND ANAEROBIC Blood Culture results may not be optimal due to an inadequate volume of blood  received in culture bottles   Culture   Final    NO GROWTH 5 DAYS Performed at [facility], [address] [place], [place] [address]    Report Status 07/17/2024 FINAL  Final  Resp panel by RT-PCR (RSV, Flu A&B, Covid) Anterior Nasal Swab     Status: None   Collection Time: 07/12/24  7:29 PM   Specimen: Anterior Nasal Swab  Result Value Ref Range Status   SARS Coronavirus 2 by RT PCR NEGATIVE NEGATIVE Final   Influenza A by PCR NEGATIVE NEGATIVE Final   Influenza B by PCR NEGATIVE NEGATIVE Final    Comment: (NOTE) The Xpert Xpress SARS-CoV-2/FLU/RSV plus assay is intended as an aid in the diagnosis of influenza from Nasopharyngeal swab specimens and should not be used as a sole basis for treatment. Nasal washings and aspirates are unacceptable for Xpert Xpress SARS-CoV-2/FLU/RSV testing.  Fact Sheet for Patients: bloggercourse.com  Fact Sheet for Healthcare Providers: seriousbroker.it  This test is not yet approved or cleared by the [place]  FDA and has been authorized for detection and/or diagnosis of SARS-CoV-2 by FDA under an Emergency Use Authorization (EUA). This EUA will remain in effect (meaning this test can  be used) for the duration of the COVID-19 declaration under Section 564(b)(1) of the Act, 21 U.S.C. section 360bbb-3(b)(1), unless the authorization is terminated or revoked.     Resp Syncytial Virus by PCR NEGATIVE NEGATIVE Final    Comment: (NOTE) Fact Sheet for Patients: bloggercourse.com  Fact Sheet for Healthcare Providers: seriousbroker.it  This test is not yet approved or cleared by the [place]  FDA and has been authorized for detection and/or diagnosis of SARS-CoV-2 by FDA under an Emergency Use Authorization (EUA). This EUA will remain in effect (meaning this test can be used) for the duration of the COVID-19 declaration under Section  564(b)(1) of the Act, 21 U.S.C. section 360bbb-3(b)(1), unless the authorization is terminated or revoked.  Performed at [facility], [address] [place], [place] [address]   MRSA Next Gen by PCR, Nasal     Status: Abnormal   Collection Time: 07/12/24 10:28 PM   Specimen: Nasal Mucosa; Nasal Swab  Result Value Ref Range Status   MRSA by PCR Next Gen DETECTED (A) NOT DETECTED Final    Comment: CRITICAL RESULT CALLED TO, READ BACK BY AND VERIFIED WITH:  MEJIA RN 07/13/2024 @ 0204 by DD (NOTE) The GeneXpert MRSA Assay (FDA approved for NASAL specimens only), is one component of a comprehensive MRSA colonization surveillance program. It is not intended to diagnose MRSA infection nor to guide or monitor treatment for MRSA infections. Test performance is not FDA approved in patients less than 35 years old. Performed at [facility], [address] [place], [place] [address]   Urine Culture (for pregnant, neutropenic or urologic patients or patients with an indwelling urinary catheter)     Status: None   Collection Time: 07/13/24  2:55 AM   Specimen: Urine, Clean Catch  Result Value Ref Range Status   Specimen Description URINE, CLEAN CATCH  Final   Special Requests NONE  Final   Culture   Final    NO GROWTH Performed at [facility], [address] [place], [place] [address]    Report Status 07/14/2024 FINAL  Final    Labs: CBC: Recent Labs  Lab 07/21/24 0451 07/22/24 0604 07/22/24 1842 07/24/24 0231  WBC 7.8 7.9  --  8.5  HGB 7.0* 7.1* 8.5* 8.1*  HCT 24.1* 24.2* 27.3* 27.2*  MCV 93.1 91.3  --  89.8  PLT 257 306  --  278   Basic Metabolic Panel: Recent Labs  Lab 07/20/24 0530 07/21/24 0451 07/22/24 0604 07/23/24 0450 07/24/24 0231 07/25/24 0549 07/26/24 0511  NA 141   < > 138 140 139 140 139  K 4.8   < > 5.1 4.9 5.3* 4.7 4.7  CL 108   < > 106 105 106 105 105  CO2 26   < > 23 24 24 24 24   GLUCOSE 225*   < > 235* 136* 188* 177* 168*   BUN 34*   < > 30* 30* 35* 31* 33*  CREATININE 1.56*   < > 1.37* 1.38* 1.57* 1.35* 1.56*  CALCIUM  9.1   < > 9.1 9.3 9.0 9.4 9.2  MG 1.9  --   --   --   --   --   --   PHOS 2.4*   < > 2.9 3.5 3.5 3.7 3.8   < > = values in this interval not displayed.   Liver Function Tests: Recent Labs  Lab 07/22/24 0604 07/23/24 0450 07/24/24 0231 07/25/24 0549 07/26/24 0511  ALBUMIN   3.9 4.1 3.8 4.1 4.0   CBG: Recent Labs  Lab 07/25/24 0736 07/25/24 1131 07/25/24 1619 07/25/24 2137 07/26/24 0744  GLUCAP 181* 180* 199* 209* 233*    Discharge time spent: greater than 30 minutes.  Signed: Deliliah Room, MD [facility] 07/26/2024 "

## 2024-07-26 NOTE — TOC Transition Note (Signed)
 Transition of Care [facility]) - Discharge Note   Patient Details  Name: Alison Whitehead MRN: [id number] Date of Birth: 25-Nov-1950  Transition of Care [facility] CM/SW Contact:  Luann SHAUNNA Cumming, LCSW Phone Number: 07/26/2024, 11:05 AM   Clinical Narrative:      Per MD patient ready [facility]. RN, patient, patient's family, and facility notified of DC. Discharge Summary and FL2 sent to facility. RN to call report prior to discharge [phone]). DC packet on chart. Ambulance transport requested for patient.   CSW will sign off for now as social work intervention is no longer needed. Please consult us  again if new needs arise.   Final next level of care: Skilled Nursing Facility Barriers to Discharge: No Barriers Identified     Discharge Placement              Patient chooses bed at: [facility] Patient to be transferred to facility by: [facility] Name of family member notified: Pt notified family Patient and family notified of of transfer: 07/26/24  Discharge Plan and Services Additional resources added to the After Visit Summary for   In-house Referral: NA Discharge Planning Services: CM Consult Post Acute Care Choice: IP Rehab                               Social Drivers of Health (SDOH) Interventions SDOH Screenings   Food Insecurity: No Food Insecurity (07/18/2024)  Housing: Low Risk (07/18/2024)  Transportation Needs: No Transportation Needs (07/18/2024)  Utilities: Not At Risk (07/18/2024)  Depression (PHQ2-9): Low Risk (07/02/2022)  Social Connections: Moderately Isolated (07/18/2024)  Tobacco Use: Medium Risk (07/18/2024)     Readmission Risk Interventions    12/28/2022    1:24 PM  Readmission Risk Prevention Plan  Post Dischage Appt Complete  Medication Screening Not Complete  Medication Screening Not Complete Comment N/A  Transportation Screening Not Complete  Transportation Screening Comment N/A

## 2024-07-26 NOTE — Progress Notes (Signed)
 Explained discharge instructions to patient. Reviewed follow up appointment and next medication administration times. Also reviewed education. Patient verbalized having an understanding for instructions given. All belongings are in the patient's possession. IV was removed by patient's RN prior to explaining the AVS. According to SW [facility] has been notified. Will take to the discharge lounge to await their arrival. No other needs verbalized.

## 2024-07-26 NOTE — Plan of Care (Signed)
" °  Problem: Education: Goal: Knowledge of General Education information will improve Description: Including pain rating scale, medication(s)/side effects and non-pharmacologic comfort measures Outcome: Adequate for Discharge   Problem: Health Behavior/Discharge Planning: Goal: Ability to manage health-related needs will improve Outcome: Adequate for Discharge   Problem: Clinical Measurements: Goal: Ability to maintain clinical measurements within normal limits will improve Outcome: Adequate for Discharge Goal: Will remain free from infection Outcome: Adequate for Discharge Goal: Diagnostic test results will improve Outcome: Adequate for Discharge Goal: Respiratory complications will improve Outcome: Adequate for Discharge Goal: Cardiovascular complication will be avoided Outcome: Adequate for Discharge   Problem: Activity: Goal: Risk for activity intolerance will decrease Outcome: Adequate for Discharge   Problem: Nutrition: Goal: Adequate nutrition will be maintained Outcome: Adequate for Discharge   Problem: Coping: Goal: Level of anxiety will decrease Outcome: Adequate for Discharge   Problem: Elimination: Goal: Will not experience complications related to bowel motility Outcome: Adequate for Discharge Goal: Will not experience complications related to urinary retention Outcome: Adequate for Discharge   Problem: Pain Managment: Goal: General experience of comfort will improve and/or be controlled Outcome: Adequate for Discharge   Problem: Safety: Goal: Ability to remain free from injury will improve Outcome: Adequate for Discharge   Problem: Skin Integrity: Goal: Risk for impaired skin integrity will decrease Outcome: Adequate for Discharge   Problem: Clinical Measurements: Goal: Complications related to the disease process or treatment will be avoided or minimized Outcome: Adequate for Discharge   Problem: Inadequate Intake (NI-2.1) Goal: Food and/or  nutrient delivery Description: Individualized approach for food/nutrient provision. Outcome: Adequate for Discharge   Problem: Education: Goal: Ability to describe self-care measures that may prevent or decrease complications (Diabetes Survival Skills Education) will improve Outcome: Adequate for Discharge Goal: Individualized Educational Video(s) Outcome: Adequate for Discharge   Problem: Coping: Goal: Ability to adjust to condition or change in health will improve Outcome: Adequate for Discharge   Problem: Fluid Volume: Goal: Ability to maintain a balanced intake and output will improve Outcome: Adequate for Discharge   Problem: Health Behavior/Discharge Planning: Goal: Ability to identify and utilize available resources and services will improve Outcome: Adequate for Discharge Goal: Ability to manage health-related needs will improve Outcome: Adequate for Discharge   Problem: Metabolic: Goal: Ability to maintain appropriate glucose levels will improve Outcome: Adequate for Discharge   Problem: Nutritional: Goal: Maintenance of adequate nutrition will improve Outcome: Adequate for Discharge Goal: Progress toward achieving an optimal weight will improve Outcome: Adequate for Discharge   Problem: Skin Integrity: Goal: Risk for impaired skin integrity will decrease Outcome: Adequate for Discharge   Problem: Tissue Perfusion: Goal: Adequacy of tissue perfusion will improve Outcome: Adequate for Discharge   Problem: Acute Rehab PT Goals(only PT should resolve) Goal: Pt Will Go Sit To Supine/Side Outcome: Adequate for Discharge Goal: Patient Will Transfer Sit To/From Stand Outcome: Adequate for Discharge Goal: Pt Will Transfer Bed To Chair/Chair To Bed Outcome: Adequate for Discharge Goal: Pt Will Ambulate Outcome: Adequate for Discharge   Problem: Acute Rehab OT Goals (only OT should resolve) Goal: Pt. Will Perform Grooming Outcome: Adequate for Discharge Goal: Pt.  Will Perform Lower Body Bathing Outcome: Adequate for Discharge Goal: Pt. Will Perform Lower Body Dressing Outcome: Adequate for Discharge Goal: Pt. Will Transfer To Toilet Outcome: Adequate for Discharge Goal: Pt. Will Perform Toileting-Clothing Manipulation Outcome: Adequate for Discharge   "

## 2024-07-26 NOTE — Discharge Instructions (Signed)
 [name]

## 2024-08-04 ENCOUNTER — Inpatient Hospital Stay (HOSPITAL_COMMUNITY)
Admission: EM | Admit: 2024-08-04 | Discharge: 2024-08-09 | DRG: 683 | Disposition: A | Discharge status: Skilled Nursing Facility | Attending: Family Medicine | Admitting: Family Medicine

## 2024-08-04 ENCOUNTER — Emergency Department (HOSPITAL_COMMUNITY)

## 2024-08-04 ENCOUNTER — Encounter (HOSPITAL_COMMUNITY): Payer: Self-pay | Admitting: Emergency Medicine

## 2024-08-04 DIAGNOSIS — Z8249 Family history of ischemic heart disease and other diseases of the circulatory system: Secondary | ICD-10-CM | POA: Diagnosis not present

## 2024-08-04 DIAGNOSIS — Z833 Family history of diabetes mellitus: Secondary | ICD-10-CM

## 2024-08-04 DIAGNOSIS — D631 Anemia in chronic kidney disease: Secondary | ICD-10-CM | POA: Diagnosis present

## 2024-08-04 DIAGNOSIS — E785 Hyperlipidemia, unspecified: Secondary | ICD-10-CM | POA: Diagnosis present

## 2024-08-04 DIAGNOSIS — E1122 Type 2 diabetes mellitus with diabetic chronic kidney disease: Secondary | ICD-10-CM | POA: Diagnosis present

## 2024-08-04 DIAGNOSIS — G8929 Other chronic pain: Secondary | ICD-10-CM | POA: Diagnosis present

## 2024-08-04 DIAGNOSIS — N179 Acute kidney failure, unspecified: Secondary | ICD-10-CM | POA: Diagnosis present

## 2024-08-04 DIAGNOSIS — Z6837 Body mass index (BMI) 37.0-37.9, adult: Secondary | ICD-10-CM | POA: Diagnosis not present

## 2024-08-04 DIAGNOSIS — E66812 Obesity, class 2: Secondary | ICD-10-CM | POA: Diagnosis present

## 2024-08-04 DIAGNOSIS — I9589 Other hypotension: Secondary | ICD-10-CM | POA: Diagnosis present

## 2024-08-04 DIAGNOSIS — M1711 Unilateral primary osteoarthritis, right knee: Secondary | ICD-10-CM | POA: Diagnosis present

## 2024-08-04 DIAGNOSIS — Z7984 Long term (current) use of oral hypoglycemic drugs: Secondary | ICD-10-CM

## 2024-08-04 DIAGNOSIS — S80211A Abrasion, right knee, initial encounter: Secondary | ICD-10-CM | POA: Diagnosis present

## 2024-08-04 DIAGNOSIS — N1832 Chronic kidney disease, stage 3b: Secondary | ICD-10-CM | POA: Diagnosis present

## 2024-08-04 DIAGNOSIS — Z87891 Personal history of nicotine dependence: Secondary | ICD-10-CM | POA: Diagnosis not present

## 2024-08-04 DIAGNOSIS — R531 Weakness: Secondary | ICD-10-CM

## 2024-08-04 DIAGNOSIS — D649 Anemia, unspecified: Secondary | ICD-10-CM

## 2024-08-04 DIAGNOSIS — I129 Hypertensive chronic kidney disease with stage 1 through stage 4 chronic kidney disease, or unspecified chronic kidney disease: Secondary | ICD-10-CM | POA: Diagnosis present

## 2024-08-04 DIAGNOSIS — W06XXXA Fall from bed, initial encounter: Secondary | ICD-10-CM | POA: Diagnosis present

## 2024-08-04 DIAGNOSIS — Z79899 Other long term (current) drug therapy: Secondary | ICD-10-CM

## 2024-08-04 DIAGNOSIS — D62 Acute posthemorrhagic anemia: Secondary | ICD-10-CM | POA: Diagnosis present

## 2024-08-04 DIAGNOSIS — M810 Age-related osteoporosis without current pathological fracture: Secondary | ICD-10-CM | POA: Diagnosis present

## 2024-08-04 DIAGNOSIS — K8689 Other specified diseases of pancreas: Secondary | ICD-10-CM | POA: Diagnosis present

## 2024-08-04 DIAGNOSIS — M25561 Pain in right knee: Secondary | ICD-10-CM

## 2024-08-04 DIAGNOSIS — Z8673 Personal history of transient ischemic attack (TIA), and cerebral infarction without residual deficits: Secondary | ICD-10-CM

## 2024-08-04 DIAGNOSIS — Z96652 Presence of left artificial knee joint: Secondary | ICD-10-CM | POA: Diagnosis present

## 2024-08-04 DIAGNOSIS — E861 Hypovolemia: Secondary | ICD-10-CM | POA: Diagnosis present

## 2024-08-04 DIAGNOSIS — E860 Dehydration: Secondary | ICD-10-CM | POA: Diagnosis present

## 2024-08-04 DIAGNOSIS — Z7982 Long term (current) use of aspirin: Secondary | ICD-10-CM

## 2024-08-04 DIAGNOSIS — Z880 Allergy status to penicillin: Secondary | ICD-10-CM

## 2024-08-04 DIAGNOSIS — Z7985 Long-term (current) use of injectable non-insulin antidiabetic drugs: Secondary | ICD-10-CM | POA: Diagnosis not present

## 2024-08-04 DIAGNOSIS — W19XXXA Unspecified fall, initial encounter: Secondary | ICD-10-CM

## 2024-08-04 DIAGNOSIS — Y92003 Bedroom of unspecified non-institutional (private) residence as the place of occurrence of the external cause: Secondary | ICD-10-CM

## 2024-08-04 DIAGNOSIS — K589 Irritable bowel syndrome without diarrhea: Secondary | ICD-10-CM | POA: Diagnosis present

## 2024-08-04 LAB — CBC
HCT: 29.3 % — ABNORMAL LOW (ref 36.0–46.0)
Hemoglobin: 8.3 g/dL — ABNORMAL LOW (ref 12.0–15.0)
MCH: 26 pg (ref 26.0–34.0)
MCHC: 28.3 g/dL — ABNORMAL LOW (ref 30.0–36.0)
MCV: 91.8 fL (ref 80.0–100.0)
Platelets: 291 K/uL (ref 150–400)
RBC: 3.19 MIL/uL — ABNORMAL LOW (ref 3.87–5.11)
RDW: 16.3 % — ABNORMAL HIGH (ref 11.5–15.5)
WBC: 9.8 K/uL (ref 4.0–10.5)
nRBC: 0 % (ref 0.0–0.2)

## 2024-08-04 LAB — URINALYSIS, ROUTINE W REFLEX MICROSCOPIC
Bilirubin Urine: NEGATIVE
Glucose, UA: 50 mg/dL — AB
Hgb urine dipstick: NEGATIVE
Ketones, ur: NEGATIVE mg/dL
Nitrite: NEGATIVE
Protein, ur: NEGATIVE mg/dL
Specific Gravity, Urine: 1.024 (ref 1.005–1.030)
pH: 5 (ref 5.0–8.0)

## 2024-08-04 LAB — BASIC METABOLIC PANEL WITH GFR
Anion gap: 12 (ref 5–15)
BUN: 49 mg/dL — ABNORMAL HIGH (ref 8–23)
CO2: 21 mmol/L — ABNORMAL LOW (ref 22–32)
Calcium: 8.3 mg/dL — ABNORMAL LOW (ref 8.9–10.3)
Chloride: 104 mmol/L (ref 98–111)
Creatinine, Ser: 3.14 mg/dL — ABNORMAL HIGH (ref 0.44–1.00)
GFR, Estimated: 15 mL/min — ABNORMAL LOW
Glucose, Bld: 134 mg/dL — ABNORMAL HIGH (ref 70–99)
Potassium: 5.3 mmol/L — ABNORMAL HIGH (ref 3.5–5.1)
Sodium: 136 mmol/L (ref 135–145)

## 2024-08-04 LAB — GLUCOSE, CAPILLARY
Glucose-Capillary: 116 mg/dL — ABNORMAL HIGH (ref 70–99)
Glucose-Capillary: 155 mg/dL — ABNORMAL HIGH (ref 70–99)

## 2024-08-04 LAB — PROCALCITONIN: Procalcitonin: 0.15 ng/mL

## 2024-08-04 LAB — CK: Total CK: 148 U/L (ref 38–234)

## 2024-08-04 MED ORDER — PANTOPRAZOLE SODIUM 40 MG PO TBEC
40.0000 mg | DELAYED_RELEASE_TABLET | Freq: Every day | ORAL | Status: DC
  Administered 2024-08-05 – 2024-08-09 (×5): 40 mg via ORAL
  Filled 2024-08-04 (×5): qty 1

## 2024-08-04 MED ORDER — MELATONIN 5 MG PO TABS
5.0000 mg | ORAL_TABLET | Freq: Every day | ORAL | Status: DC
  Administered 2024-08-04 – 2024-08-08 (×5): 5 mg via ORAL
  Filled 2024-08-04 (×5): qty 1

## 2024-08-04 MED ORDER — LIDOCAINE 5 % EX PTCH
1.0000 | MEDICATED_PATCH | CUTANEOUS | Status: DC
  Administered 2024-08-04 – 2024-08-08 (×4): 1 via TRANSDERMAL
  Filled 2024-08-04 (×5): qty 1

## 2024-08-04 MED ORDER — ATORVASTATIN CALCIUM 40 MG PO TABS
40.0000 mg | ORAL_TABLET | Freq: Every day | ORAL | Status: DC
  Administered 2024-08-05 – 2024-08-09 (×5): 40 mg via ORAL
  Filled 2024-08-04 (×5): qty 1

## 2024-08-04 MED ORDER — RENA-VITE PO TABS
1.0000 | ORAL_TABLET | Freq: Every day | ORAL | Status: DC
  Administered 2024-08-04 – 2024-08-08 (×5): 1 via ORAL
  Filled 2024-08-04 (×5): qty 1

## 2024-08-04 MED ORDER — LACTATED RINGERS IV BOLUS
1000.0000 mL | Freq: Once | INTRAVENOUS | Status: AC
  Administered 2024-08-04: 1000 mL via INTRAVENOUS

## 2024-08-04 MED ORDER — SODIUM POLYSTYRENE SULFONATE 15 GM/60ML CO SUSP: 30.0000 g | Freq: Once | Status: DC

## 2024-08-04 MED ORDER — OXYCODONE-ACETAMINOPHEN 5-325 MG PO TABS
1.0000 | ORAL_TABLET | Freq: Once | ORAL | Status: AC
  Administered 2024-08-04: 1 via ORAL
  Filled 2024-08-04: qty 1

## 2024-08-04 MED ORDER — OXYCODONE HCL 5 MG PO TABS
5.0000 mg | ORAL_TABLET | ORAL | Status: DC | PRN
  Administered 2024-08-04 – 2024-08-09 (×20): 5 mg via ORAL
  Filled 2024-08-04 (×21): qty 1

## 2024-08-04 MED ORDER — ACETAMINOPHEN 500 MG PO TABS
1000.0000 mg | ORAL_TABLET | Freq: Three times a day (TID) | ORAL | Status: DC
  Administered 2024-08-04 – 2024-08-09 (×14): 1000 mg via ORAL
  Filled 2024-08-04 (×14): qty 2

## 2024-08-04 MED ORDER — INSULIN ASPART 100 UNIT/ML IJ SOLN
0.0000 [IU] | Freq: Three times a day (TID) | INTRAMUSCULAR | Status: DC
  Administered 2024-08-05 (×3): 2 [IU] via SUBCUTANEOUS
  Administered 2024-08-06: 3 [IU] via SUBCUTANEOUS
  Administered 2024-08-07: 2 [IU] via SUBCUTANEOUS
  Administered 2024-08-07 – 2024-08-08 (×2): 3 [IU] via SUBCUTANEOUS
  Administered 2024-08-08: 2 [IU] via SUBCUTANEOUS
  Administered 2024-08-08 – 2024-08-09 (×2): 3 [IU] via SUBCUTANEOUS
  Filled 2024-08-04 (×2): qty 3
  Filled 2024-08-04 (×2): qty 2
  Filled 2024-08-04 (×2): qty 3
  Filled 2024-08-04: qty 2
  Filled 2024-08-04: qty 3
  Filled 2024-08-04 (×2): qty 2
  Filled 2024-08-04: qty 3

## 2024-08-04 MED ORDER — ONDANSETRON HCL 4 MG/2ML IJ SOLN
4.0000 mg | Freq: Once | INTRAMUSCULAR | Status: AC
  Administered 2024-08-04: 4 mg via INTRAVENOUS
  Filled 2024-08-04: qty 2

## 2024-08-04 MED ORDER — GLUCERNA SHAKE PO LIQD
237.0000 mL | Freq: Three times a day (TID) | ORAL | Status: DC
  Administered 2024-08-05 – 2024-08-09 (×7): 237 mL via ORAL
  Filled 2024-08-04: qty 237

## 2024-08-04 MED ORDER — METHOCARBAMOL 500 MG PO TABS
500.0000 mg | ORAL_TABLET | Freq: Three times a day (TID) | ORAL | Status: DC
  Administered 2024-08-04 – 2024-08-09 (×14): 500 mg via ORAL
  Filled 2024-08-04 (×14): qty 1

## 2024-08-04 MED ORDER — ASPIRIN 81 MG PO TBEC
81.0000 mg | DELAYED_RELEASE_TABLET | Freq: Every day | ORAL | Status: DC
  Administered 2024-08-05: 81 mg via ORAL
  Filled 2024-08-04: qty 1

## 2024-08-04 MED ORDER — SODIUM ZIRCONIUM CYCLOSILICATE 10 G PO PACK
10.0000 g | PACK | Freq: Once | ORAL | Status: AC
  Administered 2024-08-04: 10 g via ORAL
  Filled 2024-08-04: qty 1

## 2024-08-04 MED ORDER — DAPAGLIFLOZIN PROPANEDIOL 10 MG PO TABS: 10.0000 mg | ORAL_TABLET | Freq: Every day | ORAL | Status: DC

## 2024-08-04 MED ORDER — PANCRELIPASE (LIP-PROT-AMYL) 36000-114000 UNITS PO CPEP
36000.0000 [IU] | ORAL_CAPSULE | Freq: Three times a day (TID) | ORAL | Status: DC
  Administered 2024-08-04 – 2024-08-09 (×14): 36000 [IU] via ORAL
  Filled 2024-08-04 (×16): qty 1

## 2024-08-04 MED ORDER — MORPHINE SULFATE (PF) 2 MG/ML IV SOLN
2.0000 mg | INTRAVENOUS | Status: AC
  Administered 2024-08-04: 2 mg via INTRAVENOUS
  Filled 2024-08-04: qty 1

## 2024-08-04 MED ORDER — SODIUM CHLORIDE 0.9 % IV SOLN: INTRAVENOUS | Status: AC

## 2024-08-04 MED ORDER — HYDROMORPHONE HCL 1 MG/ML IJ SOLN
0.5000 mg | Freq: Once | INTRAMUSCULAR | Status: AC
  Administered 2024-08-04: 0.5 mg via INTRAVENOUS
  Filled 2024-08-04: qty 1

## 2024-08-04 NOTE — ED Triage Notes (Signed)
 Pt here from home with c/o fall in her bedroom, unknown if loc , pt is c/o right knee pain and low back pain , pt has a brace on right knee before fall

## 2024-08-04 NOTE — H&P (Addendum)
 " History and Physical    Patient: Alison Whitehead FMW:[id number] DOB: 14-May-1951 DOA: 08/04/2024 DOS: the patient was seen and examined on 08/04/2024 PCP: [name], PA-C  Patient coming from: Home  Chief Complaint:  Chief Complaint  Patient presents with   Fall   HPI: Alison Whitehead is a 74 y.o. female with medical history significant of CKD 3b, HTN, HLD, prediabetes, and recent admission for AMS iso AKI (tx w/ CRRT and bicarb gtt w/ resolution of AKI; d/c to SNF for rehab) who p/w GLF from bed c/b R knee pain and recurrent AKI.  The patient reported falling out of bed during the night a couple of days after returning home from a rehabilitation facility. The patient did not recall the exact time of the fall and experienced significant pain in the right knee afterward. The patient was able to return to bed but later called for an ambulance due to the severity of the pain. The patient reported being able to walk despite the pain. The patient also noted feeling thirsty and appeared dehydrated, with dry lips. There was no history of previous falls, and the patient reported eating and drinking normally upon returning home from rehab, but noted that she normally only eats once a day.  In the ED, pt hypotensive. Labs notable for K 5.3, Cr 3.14 (previously 1.5 on 12/23), and UA w/ pyruia and bacteruria. XR R knee showed severe tricompartmental osteoarthritis of the right knee. XR R hip showed no acute fracture/dislocation, and new ovoid sclerotic density in the superolateral right femoral neck measuring approximately 15 x 11 mm; indeterminate (bone island favored), consider follow-up imaging to document stability. EDP requested admission for pain control and AKI.   Review of Systems: As mentioned in the history of present illness. All other systems reviewed and are negative. 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 Her smoking use included cigarettes. She has never used smokeless tobacco. She reports that she does not drink alcohol and does not use drugs.  Allergies[1]  Family History  Problem Relation Age of Onset   Breast cancer Maternal Aunt    Diabetes Mother    Hypertension Mother    Glaucoma Mother    Other Father        accident   Diabetes Sister    Hypertension Sister    Cancer Sister    Colon cancer Neg Hx    Colon polyps Neg Hx    Esophageal cancer Neg Hx    Stomach cancer Neg Hx    Rectal cancer Neg Hx     Prior to Admission medications  Medication Sig Start Date End Date Taking? Authorizing Provider  amLODipine -olmesartan  (AZOR ) 10-40 MG tablet Take 1 tablet by mouth daily.   Yes [provider]  aspirin  EC 81 MG tablet  Take 1 tablet (81 mg total) by mouth daily. 12/13/19  Yes Penumalli, Eduard SAUNDERS, MD  atorvastatin  (LIPITOR) 40 MG tablet Take 1 tablet (40 mg total) by mouth daily. 03/01/21  Yes   carvedilol  (COREG ) 6.25 MG tablet Take 1 tablet (6.25 mg total) by mouth 2 (two) times daily. 07/26/24  Yes Dino Antu, MD  CREON  36000-114000 units CPEP capsule Take 36,000 Units by mouth in the morning and at bedtime. 01/22/22  Yes [provider]  cyclobenzaprine  (FLEXERIL ) 5 MG tablet TAKE 1 TABLET BY MOUTH THREE TIMES A DAY AS NEEDED FOR MUSCLE SPASMS 05/06/23  Yes Valdemar Longs R, NP  dapagliflozin  propanediol (FARXIGA ) 10 MG TABS tablet  Take 1 tablet (10 mg total) by mouth daily. 11/04/21  Yes Dennise Hoes, MD  dicyclomine  (BENTYL ) 10 MG capsule Take 1-2 capsules (10-20 mg total) by mouth 4 (four) times daily as needed for spasms. 07/26/24  Yes Dino Antu, MD  ferrous sulfate  325 (65 FE) MG tablet Take 325 mg by mouth daily with breakfast. 12/09/22  Yes [provider]  furosemide  (LASIX ) 20 MG tablet Take 20 mg by mouth every morning. 06/30/24  Yes [provider]  HYDROcodone -acetaminophen  (NORCO) 10-325 MG tablet Take 1 tablet by mouth every 6 (six) hours as needed for severe pain (pain score 7-10). 07/26/24  Yes Rashid, Farhan, MD  Melatonin 5 MG CAPS Take 5 mg by mouth at bedtime.   Yes [provider]  metFORMIN  (GLUCOPHAGE ) 1000 MG tablet Take 1,000 mg by mouth every evening. 06/15/24  Yes [provider]  naloxone TAMMY) nasal spray 4 mg/0.1 mL  04/25/22  Yes [provider]  OZEMPIC , 2 MG/DOSE, 8 MG/3ML SOPN Inject 2 mg into the skin once a week. 06/22/24  Yes [provider]    Physical Exam: Vitals:   08/04/24 1101 08/04/24 1320 08/04/24 1349  BP: (!) 89/62 138/78 99/68  Pulse: 73 83 79  Resp: 20 20 (!) 97  Temp: 97.7 F (36.5 C)  98.5 F (36.9 C)  TempSrc: Oral  Oral  SpO2: 92% 99% 97%   General: Alert, oriented x3, resting comfortably in no acute distressy Respiratory: Lungs clear to auscultation bilaterally with normal respiratory effort; no w/r/r Cardiovascular: Regular rate and rhythm w/o m/r/g Abdomen: Soft, nontender, nondistended. Positive bowel sounds   Data Reviewed:  Lab Results  Component Value Date   WBC 9.8 08/04/2024   HGB 8.3 (L) 08/04/2024   HCT 29.3 (L) 08/04/2024   MCV 91.8 08/04/2024   PLT 291 08/04/2024   Lab Results  Component Value Date   GLUCOSE 134 (H) 08/04/2024   CALCIUM  8.3 (L) 08/04/2024   NA 136 08/04/2024   K 5.3 (H) 08/04/2024   CO2 21 (L) 08/04/2024   CL 104 08/04/2024   BUN 49 (H) 08/04/2024    CREATININE 3.14 (H) 08/04/2024   Lab Results  Component Value Date   ALT 13 07/13/2024   AST 16 07/13/2024   ALKPHOS 62 07/13/2024   BILITOT 0.8 07/13/2024   Lab Results  Component Value Date   INR 1.2 07/12/2024   Radiology: DG Knee Complete 4 Views Right Result Date: 08/04/2024 EXAM: 4 OR MORE VIEW(S) XRAY OF THE RIGHT KNEE 08/04/2024 11:32:00 AM COMPARISON: Right knee series dated 05/05/2023. CLINICAL HISTORY: Fall, pain. FINDINGS: BONES AND JOINTS: No acute fracture. There is severe tricompartmental osteoarthritis of the right knee with mild lateral subluxation of the tibial plateau relative to the femoral condyles. There is moderate joint space narrowing, particularly  involving the medial tibial femoral compartment. There is sclerosis of the articular surfaces. There is patellofemoral osteophytosis. There is a mild suprapatellar bursal effusion. SOFT TISSUES: There are vascular calcifications. IMPRESSION: 1. 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 2. Mild suprapatellar bursal effusion. Electronically signed by: Evalene Coho MD 08/04/2024 12:31 PM EST RP Workstation: [id number]   DG HIP UNILAT W OR W/O PELVIS 2-3 VIEWS RIGHT Result Date: 08/04/2024 EXAM: 2 or 3 VIEW(S) XRAY OF THE RIGHT HIP 08/04/2024 11:32:00 AM COMPARISON: Bilateral hip series dated 02/29/2004. CLINICAL HISTORY: fall, pain FINDINGS: BONES AND JOINTS: There is a new, ovoid sclerotic density present superior laterally within the right femoral neck measuring approximately 15 x 11 mm. No acute fracture. No malalignment. There are mild degenerative changes within the sacroiliac joints bilaterally. SOFT TISSUES: There are numerous phleboliths within the pelvis. LUMBAR  SPINE: There are mild degenerative changes within the lower lumbar spine. IMPRESSION: 1. New ovoid sclerotic density in the superolateral right femoral neck measuring approximately 15 x 11 mm; indeterminate (bone island favored), consider follow-up imaging to document stability. 2. No acute fracture or dislocation. 3. Mild degenerative changes of the sacroiliac joints bilaterally and lower lumbar spine. Electronically signed by: Evalene Coho MD 08/04/2024 12:30 PM EST RP Workstation: [id number]    Assessment and Plan: 68F h/o CKD 3b, HTN, HLD, prediabetes, and recent admission for AMS iso AKI (tx w/ CRRT and bicarb gtt w/ resolution of AKI; d/c to SNF for rehab) who p/w GLF from bed c/b R knee pain and recurrent AKI.  AKI -Renal consulted and spoke with Dr. Bernardino Gasman who recommended supportive care IVF and repeat labs; re-consult if worsening -MIVF: NS at 100cc/h for 24h -Strict I&Os and daily weights (standing preferred) -F/u PVR to r/o post-renal obstruction -F/u BMP daily -Renally dose medications for CrCl -Avoid lovenox , NSAIDs, morphine, Fleet's phosphate enema, regular insulin , contrast; no gadolinium for MRI to avoid nephrogenic systemic fibrosis  Abnl UA Pyuria and bacteruria -F/u urine culture and treat for acute cystitis if indicated  R knee pain GLF -PT/OT consulted; apprec eval/recs -PO tylenol  1g TID sch, robaxin  500mg  TID sch and oxycodone  5mg  q4h prn for now   Advance Care Planning:   Code Status: Full Code   Consults: Renal  Family Communication: Daughter  Severity of Illness: The appropriate patient status for this patient is INPATIENT. Inpatient status is judged to be reasonable and necessary in order to provide the required intensity of service to ensure the patient's safety. The patient's presenting symptoms, physical exam findings, and initial radiographic and laboratory data in the context of their chronic comorbidities is felt to place them at high risk  for further clinical deterioration. Furthermore, it is not anticipated that the patient will be medically stable for discharge from the hospital within 2 midnights of admission.   * I certify that at the point of admission it is my clinical judgment that the patient will require inpatient hospital care spanning beyond 2 midnights from the point of admission due to high intensity of service, high risk for further deterioration and high frequency of surveillance required.*   ------- I spent 56 minutes reviewing previous notes, at the bedside counseling/discussing the treatment plan, and performing clinical documentation.  Author: Marsha Ada, MD 08/04/2024 4:22 PM  For on call review [email or web address].      [  1]  Allergies Allergen Reactions   Penicillins Itching   "

## 2024-08-04 NOTE — ED Provider Notes (Addendum)
 " [facility] EMERGENCY DEPARTMENT AT [facility] Provider Note   CSN: [id number] Arrival date & time: 08/04/24  1051     Patient presents with: [place]   Alison Whitehead is a 74 y.o. female.   Pt s/p fall at home. Indicates feels she lost balance getting up. Denies LOC or  fainting. Denies feeling dizzy or any room spinning, but indicates feels generally weak. Indicates had just returned home after a rehab say at [facility]. Post fall notes worsening of her chronic right knee pain and some pain in right hip. Constant, dull, mod-severe. No ankle pain. Denies other extremity pain or injury. Denies headache. No neck or back pain.  ?recent relatively poor po intake - bp is low. Denies fever, chills or sweats.  Recent admission with hypotension, aki - at d/c, cr 1.56.   The history is provided by the patient, medical records and the EMS personnel. The history is limited by the condition of the patient.  Fall Pertinent negatives include no chest pain, no abdominal pain, no headaches and no shortness of breath.       Prior to Admission medications  Medication Sig Start Date End Date Taking? Authorizing Provider  amLODipine  (NORVASC ) 5 MG tablet Take 1 tablet (5 mg total) by mouth daily. 07/26/24   Rashid, Farhan, MD  aspirin  EC 81 MG tablet Take 1 tablet (81 mg total) by mouth daily. 12/13/19   Penumalli, Eduard SAUNDERS, MD  atorvastatin  (LIPITOR) 40 MG tablet Take 1 tablet (40 mg total) by mouth daily. 03/01/21     carvedilol  (COREG ) 6.25 MG tablet Take 1 tablet (6.25 mg total) by mouth 2 (two) times daily. 07/26/24   Dino Antu, MD  CREON  36000-114000 units CPEP capsule Take 36,000 Units by mouth in the morning and at bedtime. 01/22/22   [provider]  cyclobenzaprine  (FLEXERIL ) 5 MG tablet TAKE 1 TABLET BY MOUTH THREE TIMES A DAY AS NEEDED FOR MUSCLE SPASMS 05/06/23   Valdemar Rocky SAUNDERS, NP  dapagliflozin  propanediol (FARXIGA ) 10 MG TABS tablet Take 1 tablet (10 mg total) by mouth  daily. 11/04/21   Dennise Hoes, MD  dicyclomine  (BENTYL ) 10 MG capsule Take 1-2 capsules (10-20 mg total) by mouth 4 (four) times daily as needed for spasms. 07/26/24   Rashid, Farhan, MD  feeding supplement, GLUCERNA SHAKE, (GLUCERNA SHAKE) LIQD Take 237 mLs by mouth 3 (three) times daily between meals. 07/26/24   Rashid, Farhan, MD  ferrous sulfate  325 (65 FE) MG tablet Take 325 mg by mouth 3 (three) times a week. 12/09/22   [provider]  HYDROcodone -acetaminophen  (NORCO) 10-325 MG tablet Take 1 tablet by mouth every 6 (six) hours as needed for severe pain (pain score 7-10). 07/26/24   Rashid, Farhan, MD  Melatonin 5 MG CAPS Take 5 mg by mouth at bedtime.    [provider]  metFORMIN  (GLUCOPHAGE ) 1000 MG tablet Take 1,000 mg by mouth every evening. 06/15/24   [provider]  multivitamin (RENA-VIT) TABS tablet Take 1 tablet by mouth at bedtime. 07/26/24   Rashid, Farhan, MD  naloxone [facility]) nasal spray 4 mg/0.1 mL SMARTSIG:Both Nares Patient not taking: Reported on 10/22/2022 04/25/22   [provider]  OZEMPIC , 1 MG/DOSE, 4 MG/3ML SOPN Inject 1 mg into the skin once a week. 06/29/24   [provider]  pantoprazole  (PROTONIX ) 40 MG tablet Take 1 tablet (40 mg total) by mouth daily. 07/26/24   Rashid, Farhan, MD    Allergies: Penicillins    Review  of Systems  Constitutional:  Negative for fever.  Respiratory:  Negative for shortness of breath.   Cardiovascular:  Negative for chest pain.  Gastrointestinal:  Negative for abdominal pain, nausea and vomiting.  Genitourinary:  Negative for flank pain.  Musculoskeletal:  Negative for back pain and neck pain.  Skin:  Negative for wound.  Neurological:  Negative for weakness, numbness and headaches.    Updated Vital Signs BP 99/68   Pulse 79   Temp 98.5 F (36.9 C) (Oral)   Resp (!) 97   SpO2 97%   Physical Exam Vitals and nursing note reviewed.  Constitutional:      Appearance: Normal  appearance. She is well-developed.  HENT:     Head: Atraumatic.     Nose: Nose normal.     Mouth/Throat:     Mouth: Mucous membranes are moist.  Eyes:     General: No scleral icterus.    Conjunctiva/sclera: Conjunctivae normal.     Pupils: Pupils are equal, round, and reactive to light.  Neck:     Trachea: No tracheal deviation.  Cardiovascular:     Rate and Rhythm: Normal rate and regular rhythm.     Pulses: Normal pulses.     Heart sounds: Normal heart sounds. No murmur heard.    No friction rub. No gallop.  Pulmonary:     Effort: Pulmonary effort is normal. No respiratory distress.     Breath sounds: Normal breath sounds.  Chest:     Chest wall: No tenderness.  Abdominal:     General: Bowel sounds are normal. There is no distension.     Palpations: Abdomen is soft.     Tenderness: There is no abdominal tenderness. There is no guarding.  Genitourinary:    Comments: No cva tenderness.  Musculoskeletal:        General: No swelling.     Cervical back: Normal range of motion and neck supple. No rigidity or tenderness. No muscular tenderness.     Comments: CTLS spine, non tender, aligned, no step off. Tenderness right knee anteriorly and right hip. Right knee is grossly stable. RLE is of normal color and warmth, with no gross edema or deformity, and with intact distal pulses. Otherwise good rom bil extremities without other pain or focal bony tenderness.   Skin:    General: Skin is warm and dry.     Findings: No rash.  Neurological:     Mental Status: She is alert.     Comments: Alert, speech normal. GCS 15. Motor/sens grossly intact bil.   Psychiatric:        Mood and Affect: Mood normal.     (all labs ordered are listed, but only abnormal results are displayed) Results for orders placed or performed during the hospital encounter of 08/04/24  Basic metabolic panel with GFR   Collection Time: 08/04/24  1:22 PM  Result Value Ref Range   Sodium 136 135 - 145 mmol/L    Potassium 5.3 (H) 3.5 - 5.1 mmol/L   Chloride 104 98 - 111 mmol/L   CO2 21 (L) 22 - 32 mmol/L   Glucose, Bld 134 (H) 70 - 99 mg/dL   BUN 49 (H) 8 - 23 mg/dL   Creatinine, Ser 6.85 (H) 0.44 - 1.00 mg/dL   Calcium  8.3 (L) 8.9 - 10.3 mg/dL   GFR, Estimated 15 (L) >60 mL/min   Anion gap 12 5 - 15  CBC   Collection Time: 08/04/24  1:22 PM  Result Value  Ref Range   WBC 9.8 4.0 - 10.5 K/uL   RBC 3.19 (L) 3.87 - 5.11 MIL/uL   Hemoglobin 8.3 (L) 12.0 - 15.0 g/dL   HCT 70.6 (L) 63.9 - 53.9 %   MCV 91.8 80.0 - 100.0 fL   MCH 26.0 26.0 - 34.0 pg   MCHC 28.3 (L) 30.0 - 36.0 g/dL   RDW 83.6 (H) 88.4 - 84.4 %   Platelets 291 150 - 400 K/uL   nRBC 0.0 0.0 - 0.2 %  Urinalysis, Routine w reflex microscopic -Urine, Catheterized   Collection Time: 08/04/24  2:46 PM  Result Value Ref Range   Color, Urine YELLOW YELLOW   APPearance HAZY (A) CLEAR   Specific Gravity, Urine 1.024 1.005 - 1.030   pH 5.0 5.0 - 8.0   Glucose, UA 50 (A) NEGATIVE mg/dL   Hgb urine dipstick NEGATIVE NEGATIVE   Bilirubin Urine NEGATIVE NEGATIVE   Ketones, ur NEGATIVE NEGATIVE mg/dL   Protein, ur NEGATIVE NEGATIVE mg/dL   Nitrite NEGATIVE NEGATIVE   Leukocytes,Ua TRACE (A) NEGATIVE   RBC / HPF 0-5 0 - 5 RBC/hpf   WBC, UA 0-5 0 - 5 WBC/hpf   Bacteria, UA RARE (A) NONE SEEN   Squamous Epithelial / HPF 0-5 0 - 5 /HPF   Mucus PRESENT    Hyaline Casts, UA PRESENT    DG Knee Complete 4 Views Right Result Date: 08/04/2024 EXAM: 4 OR MORE VIEW(S) XRAY OF THE RIGHT KNEE 08/04/2024 11:32:00 AM COMPARISON: Right knee series dated 05/05/2023. CLINICAL HISTORY: Fall, pain. FINDINGS: BONES AND JOINTS: No acute fracture. There is severe tricompartmental osteoarthritis of the right knee with mild lateral subluxation of the tibial plateau relative to the femoral condyles. There is moderate joint space narrowing, particularly involving the medial tibial femoral compartment. There is sclerosis of the articular surfaces. There is  patellofemoral osteophytosis. There is a mild suprapatellar bursal effusion. SOFT TISSUES: There are vascular calcifications. IMPRESSION: 1. 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 2. Mild suprapatellar bursal effusion. Electronically signed by: Evalene Coho MD 08/04/2024 12:31 PM EST RP Workstation: [id number]   DG HIP UNILAT W OR W/O PELVIS 2-3 VIEWS RIGHT Result Date: 08/04/2024 EXAM: 2 or 3 VIEW(S) XRAY OF THE RIGHT HIP 08/04/2024 11:32:00 AM COMPARISON: Bilateral hip series dated 02/29/2004. CLINICAL HISTORY: fall, pain FINDINGS: BONES AND JOINTS: There is a new, ovoid sclerotic density present superior laterally within the right femoral neck measuring approximately 15 x 11 mm. No acute fracture. No malalignment. There are mild degenerative changes within the sacroiliac joints bilaterally. SOFT TISSUES: There are numerous phleboliths within the pelvis. LUMBAR SPINE: There are mild degenerative changes within the lower lumbar spine. IMPRESSION: 1. New ovoid sclerotic density in the superolateral right femoral neck measuring approximately 15 x 11 mm; indeterminate (bone island favored), consider follow-up imaging to document stability. 2. No acute fracture or dislocation. 3. Mild degenerative changes of the sacroiliac joints bilaterally and lower lumbar spine. Electronically signed by: Evalene Coho MD 08/04/2024 12:30 PM EST RP Workstation: [id number]   CT ABDOMEN PELVIS WO CONTRAST Result Date: 07/13/2024 EXAM: CT ABDOMEN AND PELVIS WITHOUT CONTRAST 07/13/2024 08:14:00 AM TECHNIQUE: CT of the abdomen and pelvis was performed without the administration of intravenous contrast. Multiplanar reformatted images are provided for review.  Automated exposure control, iterative reconstruction, and/or weight-based  adjustment of the mA/kV was utilized to reduce the radiation dose to as low as reasonably achievable. COMPARISON: 09/30/2019 CLINICAL HISTORY: abd pain, ams FINDINGS: LOWER CHEST: Moderate cardiomegaly. Bilateral lower lobe airspace opacities primarily from atelectasis. LIVER: The liver is unremarkable. GALLBLADDER AND BILE DUCTS: Gallbladder is unremarkable. No biliary ductal dilatation. SPLEEN: No acute abnormality. PANCREAS: No acute abnormality. ADRENAL GLANDS: No acute abnormality. KIDNEYS, URETERS AND BLADDER: 1.5 x 0.7 x 1.3 cm hyperdense lesion of the left mid kidney posteromedially on image 34 series 3, internal density 101 Hounsfield units, compatible with benign positive category 2 cyst. No further imaging workup of this lesion is indicated. Foley catheter is present in the urinary bladder. No hydronephrosis or hydroureter. Bilateral periureteral vascular calcifications but no definite intraureteral stone seen. GI AND BOWEL: Stomach demonstrates no acute abnormality. Sigmoid colon diverticulosis and scattered diverticula elsewhere in the colon. Vertically oriented 3.4 cm linear density in the sigmoid colon on image 86 series 7, not present on prior exams, possibly intraluminal animal bone given the configuration. No associated obstruction or extraluminal gas although the orientation of the long axis of this structure is perpendicular to the axis of the sigmoid colon. There is no bowel obstruction. PERITONEUM AND RETROPERITONEUM: No ascites. No free air. VASCULATURE: Systemic atherosclerosis is present, including the aorta and iliac arteries. Aorta is normal in caliber. Right venous femoral line terminates in the right external iliac vein. Left femoral venous line likewise terminates in the common femoral vein. A smaller likely arterial left femoral vein extends distally into the profunda branch of the left femoral artery, with tip  not visible. LYMPH NODES: No lymphadenopathy. REPRODUCTIVE ORGANS: Calcified uterine fibroids. BONES AND SOFT TISSUES: Supraumbilical hernia contains adipose tissue. 7 mm degenerative anterolisthesis at L4-L5. Lumbar spondylosis and degenerative disc disease cause moderate left foraminal impingement at L3-L4 and bilateral foraminal impingement at L4-L5. Moderate degenerative hip arthropathy bilaterally. No acute osseous abnormality. No focal soft tissue abnormality. IMPRESSION: 1. Vertically oriented 3.4 cm linear density in the sigmoid colon, possibly an ingested bone, without current obstruction or extraluminal gas. This structure is oriented perpendicular to the axis of the sigmoid colon. Recommend correlation with focal lower pelvis tenderness or GI bleeding ; if concern for retained sharp foreign body, consider surgical or endoscopic evaluation. 2. The left femoral arterial line appears to extend distally into the profunda femoris branch of the left femoral artery , tip not included. 3. Benign Bosniak II hemorrhagic/proteinaceous cyst in the left kidney; no follow-up imaging recommended. 4. Systemic atherosclerosis involving the aorta and iliac arteries. 5. Moderate cardiomegaly and bilateral lower lobe airspace opacities likely from atelectasis. 6. Degenerative changes including 7 mm degenerative anterolisthesis at L4-5, lumbar spondylosis and degenerative disc disease with moderate left foraminal impingement at L3-4 and bilateral foraminal impingement at L4-5, and moderate bilateral hip arthropathy. 7. Additional chronic/incidental findings including Foley catheter within the urinary bladder, right femoral venous line terminating in the right external iliac vein, left femoral venous line terminating in the common femoral vein, sigmoid diverticulosis and scattered colonic diverticula, calcified uterine fibroids, and a supraumbilical fat-containing hernia. Electronically signed by: Ryan Salvage MD  07/13/2024 08:46 AM EST RP Workstation: [id number]   CT Head Wo Contrast Result Date: 07/13/2024 CLINICAL DATA:  Altered mental status. EXAM: CT HEAD WITHOUT CONTRAST TECHNIQUE: Contiguous axial images were obtained from the base of the skull through the vertex without intravenous contrast. RADIATION DOSE REDUCTION: This exam was performed according to the departmental dose-optimization program which includes automated exposure control, adjustment  of the mA and/or kV according to patient size and/or use of iterative reconstruction technique. COMPARISON:  05/15/2020 FINDINGS: Brain: There is no evidence for acute hemorrhage, hydrocephalus, mass lesion, or abnormal extra-axial fluid collection. No definite CT evidence for acute infarction. Vascular: No hyperdense vessel or unexpected calcification. Skull: No evidence for fracture. No worrisome lytic or sclerotic lesion. Sinuses/Orbits: The visualized paranasal sinuses and mastoid air cells are clear. Old medial right orbital wall fracture again noted. Other: None. IMPRESSION: No acute intracranial abnormality. Electronically Signed   By: Camellia Candle M.D.   On: 07/13/2024 08:40   US  RENAL Result Date: 07/12/2024 CLINICAL DATA:  Acute kidney injury EXAM: RENAL / URINARY TRACT ULTRASOUND COMPLETE COMPARISON:  Ultrasound 11/22/2020 FINDINGS: Right Kidney: Renal measurements: 9.5 x 5.8 x 4.7 cm = volume: 134 mL. Echogenic cortex. No mass or hydronephrosis Left Kidney: Renal measurements: 8.4 x 4.9 x 4.5 cm = volume: 96.2 mL. Echogenic cortex. No mass or hydronephrosis. Bladder: Appears normal for degree of bladder distention. Other: None. IMPRESSION: Slightly small but symmetric kidneys with echogenic cortex consistent with medical renal disease and mild atrophy/scarring. No hydronephrosis Electronically Signed   By: Luke Bun M.D.   On: 07/12/2024 23:42   DG Chest Port 1 View Result Date: 07/12/2024 CLINICAL DATA:  Questionable sepsis. EXAM: PORTABLE CHEST  1 VIEW COMPARISON:  Chest radiograph dated 05/20/2016. FINDINGS: Cardiomegaly with vascular congestion. No focal consolidation, pleural effusion, or pneumothorax. No acute osseous pathology. IMPRESSION: Cardiomegaly with vascular congestion. No focal consolidation. Electronically Signed   By: Vanetta Chou M.D.   On: 07/12/2024 19:57     EKG: EKG Interpretation Date/Time:  Thursday August 04 2024 14:36:14 EST Ventricular Rate:  78 PR Interval:  205 QRS Duration:  101 QT Interval:  391 QTC Calculation: 446 R Axis:   17  Text Interpretation: Sinus rhythm Low voltage, extremity leads Nonspecific T wave abnormality Confirmed by Bernard Drivers ([id number]) on 08/04/2024 3:09:33 PM  Radiology: [facility] Knee Complete 4 Views Right Result Date: 08/04/2024 EXAM: 4 OR MORE VIEW(S) XRAY OF THE RIGHT KNEE 08/04/2024 11:32:00 AM COMPARISON: Right knee series dated 05/05/2023. CLINICAL HISTORY: Fall, pain. FINDINGS: BONES AND JOINTS: No acute fracture. There is severe tricompartmental osteoarthritis of the right knee with mild lateral subluxation of the tibial plateau relative to the femoral condyles. There is moderate joint space narrowing, particularly involving the medial tibial femoral compartment. There is sclerosis of the articular surfaces. There is patellofemoral osteophytosis. There is a mild suprapatellar bursal effusion. SOFT TISSUES: There are vascular calcifications. IMPRESSION: 1. 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 2. Mild suprapatellar bursal effusion. Electronically signed by: Evalene Coho MD 08/04/2024 12:31 PM EST RP Workstation: [id number]   DG HIP UNILAT W OR W/O PELVIS 2-3 VIEWS RIGHT Result Date: 08/04/2024 EXAM: 2 or 3 VIEW(S)  XRAY OF THE RIGHT HIP 08/04/2024 11:32:00 AM COMPARISON: Bilateral hip series dated 02/29/2004. CLINICAL HISTORY: fall, pain FINDINGS: BONES AND JOINTS: There is a new, ovoid sclerotic density present superior laterally within the right femoral neck measuring approximately 15 x 11 mm. No acute fracture. No malalignment. There are mild degenerative changes within the sacroiliac joints bilaterally. SOFT TISSUES: There are numerous phleboliths within the pelvis. LUMBAR SPINE: There are mild degenerative changes within the lower lumbar spine. IMPRESSION: 1. New ovoid sclerotic density in the  superolateral right femoral neck measuring approximately 15 x 11 mm; indeterminate (bone island favored), consider follow-up imaging to document stability. 2. No acute fracture or dislocation. 3. Mild degenerative changes of the sacroiliac joints bilaterally and lower lumbar spine. Electronically signed by: Evalene Coho MD 08/04/2024 12:30 PM EST RP Workstation: [id number]     Procedures   Medications Ordered in the ED  0.9 %  sodium chloride  infusion (has no administration in time range)  oxyCODONE -acetaminophen  (PERCOCET/ROXICET) 5-325 MG per tablet 1 tablet (1 tablet Oral Given 08/04/24 1148)  lactated ringers  bolus 1,000 mL (1,000 mLs Intravenous New Bag/Given 08/04/24 1349)  lactated ringers  bolus 1,000 mL (1,000 mLs Intravenous New Bag/Given 08/04/24 1443)  HYDROmorphone  (DILAUDID ) injection 0.5 mg (0.5 mg Intravenous Given 08/04/24 1421)  ondansetron  (ZOFRAN ) injection 4 mg (4 mg Intravenous Given 08/04/24 1421)                                    Medical Decision Making Problems Addressed: Accidental fall, initial encounter: acute illness or injury with systemic symptoms that poses a threat to life or bodily functions Acute pain of right knee: acute illness or injury AKI (acute kidney injury): acute illness or injury with systemic symptoms that poses a threat to life or bodily functions Chronic anemia:  chronic illness or injury Chronic pain of right knee: chronic illness or injury Dehydration: acute illness or injury with systemic symptoms that poses a threat to life or bodily functions General weakness: acute illness or injury with systemic symptoms that poses a threat to life or bodily functions Hypotension due to hypovolemia: acute illness or injury with systemic symptoms that poses a threat to life or bodily functions Primary osteoarthritis of right knee: chronic illness or injury  Amount and/or Complexity of Data Reviewed Independent Historian: EMS    Details: hx External Data Reviewed: notes. Labs: ordered. Decision-making details documented in ED Course. Radiology: ordered and independent interpretation performed. Decision-making details documented in ED Course. ECG/medicine tests: ordered and independent interpretation performed. Decision-making details documented in ED Course. Discussion of management or test interpretation with external provider(s): medicine  Risk Prescription drug management. Parenteral controlled substances. Decision regarding hospitalization.   Iv ns. Continuous pulse ox and cardiac monitoring. Labs ordered/sent. Imaging ordered.   Differential diagnosis includes fracture, contusion, etc. Dispo decision including potential need for admission considered - will get labs and imaging and reassess.   Reviewed nursing notes and prior charts for additional history. External reports reviewed. Additional history from: EMS.   Cardiac monitor: sinus rhythm, rate 78.  Dilaudid  iv. Zofran  iv. LR bolus.   Labs reviewed/interpreted by me - cr 3.14, prior 1.56 c/w aki. ?recent poor po intake. Pt now indicates was on ground for some time before calling. Ck added to labs. Ua pending.   Xrays reviewed/interpreted by me - wbc 9. Hgb 8.3 c/w baseline.   Hospitalists consulted for admission re aki, dehydration, hypotension - discussed pt - will admit.   CRITICAL CARE  RE Hypotension with AKI Performed by: Ninel Abdella E Gladys Gutman Total critical care time: 45 minutes Critical care time was exclusive of separately billable procedures and treating other patients. Critical care was necessary to treat or prevent imminent or life-threatening deterioration. Critical care was time spent personally by me on the following activities: development of treatment plan with patient and/or surrogate as well as nursing, discussions with consultants, evaluation of patient's response to treatment, examination of patient, obtaining  history from patient or surrogate, ordering and performing treatments and interventions, ordering and review of laboratory studies, ordering and review of radiographic studies, pulse oximetry and re-evaluation of patient's condition.       Final diagnoses:  AKI (acute kidney injury)  General weakness  Hypotension due to hypovolemia  Accidental fall, initial encounter  Chronic anemia  Chronic pain of right knee  Acute pain of right knee  Primary osteoarthritis of right knee  Dehydration    ED Discharge Orders     None         Bernard Drivers, MD 08/04/24 1510  "

## 2024-08-05 ENCOUNTER — Other Ambulatory Visit: Payer: Self-pay

## 2024-08-05 ENCOUNTER — Inpatient Hospital Stay (HOSPITAL_COMMUNITY)

## 2024-08-05 LAB — BLOOD CULTURE ID PANEL (REFLEXED) - BCID2

## 2024-08-05 LAB — GLUCOSE, CAPILLARY
Glucose-Capillary: 123 mg/dL — ABNORMAL HIGH (ref 70–99)
Glucose-Capillary: 124 mg/dL — ABNORMAL HIGH (ref 70–99)
Glucose-Capillary: 127 mg/dL — ABNORMAL HIGH (ref 70–99)
Glucose-Capillary: 127 mg/dL — ABNORMAL HIGH (ref 70–99)

## 2024-08-05 LAB — CBC
HCT: 26.3 % — ABNORMAL LOW (ref 36.0–46.0)
Hemoglobin: 7.7 g/dL — ABNORMAL LOW (ref 12.0–15.0)
MCH: 26.6 pg (ref 26.0–34.0)
MCHC: 29.3 g/dL — ABNORMAL LOW (ref 30.0–36.0)
MCV: 90.7 fL (ref 80.0–100.0)
Platelets: 258 K/uL (ref 150–400)
RBC: 2.9 MIL/uL — ABNORMAL LOW (ref 3.87–5.11)
RDW: 16.3 % — ABNORMAL HIGH (ref 11.5–15.5)
WBC: 6.7 K/uL (ref 4.0–10.5)
nRBC: 0 % (ref 0.0–0.2)

## 2024-08-05 LAB — BASIC METABOLIC PANEL WITH GFR
Anion gap: 10 (ref 5–15)
BUN: 40 mg/dL — ABNORMAL HIGH (ref 8–23)
CO2: 22 mmol/L (ref 22–32)
Calcium: 8.3 mg/dL — ABNORMAL LOW (ref 8.9–10.3)
Chloride: 107 mmol/L (ref 98–111)
Creatinine, Ser: 2.28 mg/dL — ABNORMAL HIGH (ref 0.44–1.00)
GFR, Estimated: 22 mL/min — ABNORMAL LOW
Glucose, Bld: 108 mg/dL — ABNORMAL HIGH (ref 70–99)
Potassium: 4.7 mmol/L (ref 3.5–5.1)
Sodium: 139 mmol/L (ref 135–145)

## 2024-08-05 MED ORDER — SENNOSIDES-DOCUSATE SODIUM 8.6-50 MG PO TABS
1.0000 | ORAL_TABLET | Freq: Two times a day (BID) | ORAL | Status: DC
  Administered 2024-08-05 – 2024-08-09 (×8): 1 via ORAL
  Filled 2024-08-05 (×8): qty 1

## 2024-08-05 MED ORDER — HYDROMORPHONE HCL 1 MG/ML IJ SOLN
0.5000 mg | INTRAMUSCULAR | Status: AC
  Administered 2024-08-05: 0.5 mg via INTRAVENOUS
  Filled 2024-08-05: qty 0.5

## 2024-08-05 MED ORDER — POLYETHYLENE GLYCOL 3350 17 G PO PACK
17.0000 g | PACK | Freq: Every day | ORAL | Status: DC
  Administered 2024-08-05 – 2024-08-09 (×5): 17 g via ORAL
  Filled 2024-08-05 (×6): qty 1

## 2024-08-05 MED ORDER — SODIUM CHLORIDE 0.9 % IV SOLN: INTRAVENOUS | Status: DC

## 2024-08-05 MED ORDER — HYDROMORPHONE HCL 1 MG/ML IJ SOLN: 0.5000 mg | Freq: Once | INTRAMUSCULAR | Status: DC

## 2024-08-05 NOTE — Hospital Course (Signed)
 74 y.o. F with obesity, HTN and CKD IIIb admitted 1 month ago for AKI requiring CRRT temporarily who returned with fall.  Found to have AKI.  In the hospital for 2 weeks last month, for AKI, required CRRT, Cr resolved to baseline 1.3-1.5, discharged to SNF.  Evidently discharged from rehab after less than a week, was home only a couple days then fell.  In the ER, reported dizziness, appeared dehydrated.  Cr back up to 3.3.  Also right knee swollen.  Admitted for AKI.

## 2024-08-05 NOTE — Progress Notes (Signed)
 PHARMACY - PHYSICIAN COMMUNICATION CRITICAL VALUE ALERT - BLOOD CULTURE IDENTIFICATION (BCID)  Alison Whitehead is an 74 y.o. female who presented to [facility] on 08/04/2024 with a chief complaint of fall  Assessment:  One blood culture is positive for GPC - BCID showing streptococcus species.  Could be real or contamination.  Has been afebrile off of antibiotics.    Name of physician (or Provider) Contacted: [place]  Current antibiotics: None  Changes to prescribed antibiotics recommended: Discussed with Dr. Jonel.  Will continue to monitor her off of antibiotics.   Results for orders placed or performed during the hospital encounter of 08/04/24  Blood Culture ID Panel (Reflexed) (Collected: 08/04/2024  3:30 PM)  Result Value Ref Range   Enterococcus faecalis NOT DETECTED NOT DETECTED   Enterococcus Faecium NOT DETECTED NOT DETECTED   Listeria monocytogenes NOT DETECTED NOT DETECTED   Staphylococcus species NOT DETECTED NOT DETECTED   Staphylococcus aureus (BCID) NOT DETECTED NOT DETECTED   Staphylococcus epidermidis NOT DETECTED NOT DETECTED   Staphylococcus lugdunensis NOT DETECTED NOT DETECTED   Streptococcus species DETECTED (A) NOT DETECTED   Streptococcus agalactiae NOT DETECTED NOT DETECTED   Streptococcus pneumoniae NOT DETECTED NOT DETECTED   Streptococcus pyogenes NOT DETECTED NOT DETECTED   A.calcoaceticus-baumannii NOT DETECTED NOT DETECTED   Bacteroides fragilis NOT DETECTED NOT DETECTED   Enterobacterales NOT DETECTED NOT DETECTED   Enterobacter cloacae complex NOT DETECTED NOT DETECTED   Escherichia coli NOT DETECTED NOT DETECTED   Klebsiella aerogenes NOT DETECTED NOT DETECTED   Klebsiella oxytoca NOT DETECTED NOT DETECTED   Klebsiella pneumoniae NOT DETECTED NOT DETECTED   Proteus species NOT DETECTED NOT DETECTED   Salmonella species NOT DETECTED NOT DETECTED   Serratia marcescens NOT DETECTED NOT DETECTED   Haemophilus influenzae NOT DETECTED NOT  DETECTED   Neisseria meningitidis NOT DETECTED NOT DETECTED   Pseudomonas aeruginosa NOT DETECTED NOT DETECTED   Stenotrophomonas maltophilia NOT DETECTED NOT DETECTED   Candida albicans NOT DETECTED NOT DETECTED   Candida auris NOT DETECTED NOT DETECTED   Candida glabrata NOT DETECTED NOT DETECTED   Candida krusei NOT DETECTED NOT DETECTED   Candida parapsilosis NOT DETECTED NOT DETECTED   Candida tropicalis NOT DETECTED NOT DETECTED   Cryptococcus neoformans/gattii NOT DETECTED NOT DETECTED    Alison Whitehead 08/05/2024  2:48 PM

## 2024-08-05 NOTE — NC FL2 (Signed)
 " [place]  MEDICAID FL2 LEVEL OF CARE FORM     IDENTIFICATION  Patient Name: Alison Whitehead Birthdate: [birth year] Sex: female Admission Date (Current Location): 08/04/2024  [facility] and [place] Number:  Producer, Television/film/video and Address:  [facility] [place], [address] [place], [place] [address]      Provider Number: [id number]  Attending Physician Name and Address:  Jonel Lonni SQUIBB, *  Relative Name and Phone Number:  [name] Mau (daughter) [phone]    Current Level of Care: Hospital Recommended Level of Care: Skilled Nursing Facility Prior Approval Number:    Date Approved/Denied:   PASRR Number: [id number]  Discharge Plan: SNF    Current Diagnoses: Patient Active Problem List   Diagnosis Date Noted   Debility 07/18/2024   Metabolic encephalopathy 07/18/2024   Primary osteoarthritis of right knee 07/18/2024   Shock (HCC) 07/13/2024   Total knee replacement status, 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   Rotator cuff syndrome, right 01/11/2020   Labile blood glucose    Vascular headache    Small vessel disease, cerebrovascular    Prediabetes    AKI (acute kidney injury)    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   Acute cerebrovascular accident (CVA) (HCC) 09/30/2019   Unilateral primary osteoarthritis, left knee 03/17/2019   Unilateral primary osteoarthritis, right knee 03/17/2019   Bilateral primary osteoarthritis of knee 03/10/2019   INSOMNIA, CHRONIC 05/19/2007   SPONDYLOSIS, LUMBAR 05/19/2007   Essential hypertension 05/03/2007   ARTHRITIS, KNEE 05/03/2007    Orientation RESPIRATION BLADDER Height & Weight     Self, Place  O2 (2L Nasal Cannula) Incontinent Weight: 236 lb 12.4 oz (107.4 kg) Height:  5' 7 (170.2 cm)  BEHAVIORAL SYMPTOMS/MOOD NEUROLOGICAL BOWEL NUTRITION STATUS      Continent Diet (Please see discharge summary)  AMBULATORY STATUS COMMUNICATION OF NEEDS Skin   Extensive Assist Verbally Normal                        Personal Care Assistance Level of Assistance  Bathing, Feeding, Dressing Bathing Assistance: Maximum assistance Feeding assistance: Maximum assistance Dressing Assistance: Maximum assistance     Functional Limitations Info  Sight Sight Info: Impaired (R and L (Eyeglasses))        SPECIAL CARE FACTORS FREQUENCY  PT (By licensed PT), OT (By licensed OT)     PT Frequency: 5x OT Frequency: 5x            Contractures Contractures Info: Not present    Additional Factors Info  Code Status, Allergies, Insulin  Sliding Scale, Psychotropic Code Status Info: Full Code Allergies Info: Penicillins Psychotropic Info: Please see discharge summary Insulin  Sliding Scale Info: Please see discharge summary       Current Medications (08/05/2024):  This is the current hospital active medication list Current Facility-Administered Medications  Medication Dose Route Frequency Provider Last Rate Last Admin   0.9 %  sodium chloride  infusion   Intravenous Continuous Georgina Basket, MD 100 mL/hr at 08/04/24 1759 New Bag at 08/04/24 1759   acetaminophen  (TYLENOL ) tablet 1,000 mg  1,000 mg Oral TID Georgina Basket, MD   1,000 mg at 08/05/24 9081   aspirin  EC tablet 81 mg  81 mg Oral Daily Georgina Basket, MD   81 mg at 08/05/24 0919   atorvastatin  (LIPITOR) tablet 40 mg  40 mg Oral Daily Georgina Basket, MD   40 mg at 08/05/24 0919   feeding supplement (GLUCERNA SHAKE) (GLUCERNA SHAKE) liquid 237 mL  237 mL Oral  TID BM Georgina Basket, MD   237 mL at 08/05/24 9081   insulin  aspart (novoLOG ) injection 0-15 Units  0-15 Units Subcutaneous TID WC Georgina Basket, MD   2 Units at 08/05/24 0757   lidocaine  (LIDODERM ) 5 % 1 patch  1 patch Transdermal Q24H Sundil, Subrina, MD   1 patch at 08/04/24 2035   lipase/protease/amylase (CREON ) capsule 36,000 Units  36,000 Units Oral TID THEOPOLIS Georgina Basket, MD   36,000 Units at 08/05/24 0800   melatonin tablet 5 mg  5 mg Oral QHS Georgina Basket, MD   5 mg at 08/04/24 1936    methocarbamol  (ROBAXIN ) tablet 500 mg  500 mg Oral TID Georgina Basket, MD   500 mg at 08/05/24 0919   multivitamin (RENA-VIT) tablet 1 tablet  1 tablet Oral QHS Moore, Willie, MD   1 tablet at 08/04/24 1936   oxyCODONE  (Oxy IR/ROXICODONE ) immediate release tablet 5 mg  5 mg Oral Q4H PRN Georgina Basket, MD   5 mg at 08/05/24 0756   pantoprazole  (PROTONIX ) EC tablet 40 mg  40 mg Oral Daily Moore, Willie, MD   40 mg at 08/05/24 9080     Discharge Medications: Please see discharge summary for a list of discharge medications.  Relevant Imaging Results:  Relevant Lab Results:   Additional Information [id number]  Lauraine FORBES Saa, LCSWA     "

## 2024-08-05 NOTE — Progress Notes (Signed)
" °  Progress Note   Patient: Alison Whitehead FMW:[id number] DOB: 08-Feb-1951 DOA: 08/04/2024     1 DOS: the patient was seen and examined on 08/05/2024 at 9:50AM      Brief hospital course: 74 y.o. F with obesity, HTN and CKD IIIb admitted 1 month ago for AKI requiring CRRT temporarily who returned with fall.  Found to have AKI.  In the hospital for 2 weeks last month, for AKI, required CRRT, Cr resolved to baseline 1.3-1.5, discharged to SNF.  Evidently discharged from rehab after less than a week, was home only a couple days then fell.  In the ER, reported dizziness, appeared dehydrated.  Cr back up to 3.3.  Also right knee swollen.  Admitted for AKI.     Assessment and Plan: AKI on CKD IIIb Baseline Cr 1.3-1.5 at the time of discharge 12/23 Cr on return back up to 3.1.  Appeared dehdyrated on exam, improved to 2.2 today with fluids - Hold aspirin , furosemide , olmesartan  metformin  - Continue IV fluids - Trend Cr - Avoid hypotension    Abnormal blood culture Strep spp in 1/2.  Possible contaminant.  Procal only 0.15, no fever or white count or constitutional symptoms - Hold Abx - Follow culture to speciation  Knee effusion Right knee osteoarthritis Discussed with Ortho.  CT shows effusion, severe arthritis, no fracture.   - WBAT - Follow up with Dr. Harden as outpatient   Anemia of CKD Marked anemia, stable from last admission.  No bleeding reported or observed.   Hypertension BP soft - Hold amlodipine , olmesartan  - Hold Coreg , furosemide   Diabetes Glucoses controlled - Hold metformin , Farxiga  - Continue SS corrections  Class 2 obesity - Hold Ozempic   Pancreatic insufficiency - Continue Creon         Subjective: Mentating well, no fever, no cough, no other new focal concerns other than severe right knee pain, swelling, inability to bear weight.     Physical Exam: BP 109/71 (BP Location: Left Arm)   Pulse 74   Temp 98.1 F (36.7 C) (Oral)   Resp  18   Ht 5' 7 (1.702 m)   Wt 107.4 kg   SpO2 94%   BMI 37.08 kg/m   General: Pt is alert, awake, not in acute distress Cardiovascular: RRR, nl S1-S2, no murmurs appreciated.   No LE edema.   Respiratory: Normal respiratory rate and rhythm.  CTAB without rales or wheezes. Abdominal: Abdomen soft and non-tender.  No distension or HSM.   MSK: The right knee has a tender ballotable effusion, no redness or cellutlitis. Neuro/Psych: Strength symmetric in upper and lower extremities.  Judgment and insight appear normal.   Data Reviewed: BMP shows Cr down to 2.2 CBC shows Hgb slightly down to 7.7   Family Communication: None    Disposition: Status is: Inpatient 74 yo F with obesity, recent AKI admitted after SNF discharge due to AKI again  Cr improved but not back to baseline.  If Cr improves and culture finalizes, will need SNF due to severe knee pain inability to walk        Author: Lonni SHAUNNA Dalton, MD 08/05/2024 5:51 PM  For on call review [email or web address].    "

## 2024-08-05 NOTE — Evaluation (Signed)
 Occupational Therapy Evaluation Patient Details Name: Alison Whitehead MRN: [id number] DOB: 1950-09-21 Today's Date: 08/05/2024   History of Present Illness   74 y.o. female presents to [facility] on 08/05/2023 after a fall with R knee pain. Workup also notable for recurrent AKI. PMH includes CKD III, HTN, HLD, prediabetes.     Clinical Impressions Patient admitted for the diagnosis above.  Recent hospitalization, transfer to SNF for rehab, and had only been at home for a few days prior to falling.  Deficit is pain to her knee, limiting her ability to stand and complete transfers.   Unfortunately she does not have the need 24 hour Mod A at home to transition directly there.  Patient will benefit from continued inpatient follow up therapy, <3 hours/day to assist with regaining mobility and ADL independence once pain is controlled.       If plan is discharge home, recommend the following:   Assistance with cooking/housework;Assist for transportation;Help with stairs or ramp for entrance;A little help with walking and/or transfers;A little help with bathing/dressing/bathroom     Functional Status Assessment   Patient has had a recent decline in their functional status and demonstrates the ability to make significant improvements in function in a reasonable and predictable amount of time.     Equipment Recommendations         Recommendations for Other Services         Precautions/Restrictions   Precautions Precautions: Fall Recall of Precautions/Restrictions: Impaired Restrictions Weight Bearing Restrictions Per Provider Order: No     Mobility Bed Mobility Overal bed mobility: Needs Assistance Bed Mobility: Supine to Sit, Sit to Supine     Supine to sit: Min assist Sit to supine: Min assist, Mod assist        Transfers                          Balance Overall balance assessment: Needs assistance Sitting-balance support: No upper extremity  supported, Feet supported Sitting balance-Leahy Scale: Fair         Standing balance comment: not tested                           ADL either performed or assessed with clinical judgement   ADL   Eating/Feeding: Independent;Bed level   Grooming: Wash/dry hands;Set up;Sitting   Upper Body Bathing: Set up;Supervision/ safety;Sitting   Lower Body Bathing: Moderate assistance;Sitting/lateral leans   Upper Body Dressing : Set up;Sitting   Lower Body Dressing: Sitting/lateral leans;Moderate assistance   Toilet Transfer: Maximal assistance;Squat-pivot;BSC/3in1                   Vision Baseline Vision/History: 1 Wears glasses Patient Visual Report: No change from baseline       Perception Perception: Not tested       Praxis Praxis: Not tested       Pertinent Vitals/Pain Pain Assessment Pain Assessment: Faces Faces Pain Scale: Hurts whole lot Pain Location: R knee Pain Descriptors / Indicators: Sore, Stabbing, Grimacing, Guarding     Extremity/Trunk Assessment Upper Extremity Assessment Upper Extremity Assessment: Overall WFL for tasks assessed   Lower Extremity Assessment Lower Extremity Assessment: Defer to PT evaluation   Cervical / Trunk Assessment Cervical / Trunk Assessment: Normal   Communication Communication Communication: No apparent difficulties Factors Affecting Communication: Hearing impaired   Cognition Arousal: Alert Behavior During Therapy: WFL for tasks assessed/performed Cognition: No apparent impairments  OT - Cognition Comments: Able to follow all commands, mild decreased insight into deficits.                 Following commands: Intact       Cueing  General Comments   Cueing Techniques: Verbal cues   VSS on RA   Exercises     Shoulder Instructions      Home Living Family/patient expects to be discharged to:: Private residence Living Arrangements: Alone Available Help at Discharge:  Family;Available PRN/intermittently Type of Home: Apartment Home Access: Level entry     Home Layout: One level     Bathroom Shower/Tub: Chief Strategy Officer: Standard     Home Equipment: Agricultural Consultant (2 wheels);Cane - single point   Additional Comments: prior to admission, she was IND with ADLs, IADLs, driving and using a RW for mobility      Prior Functioning/Environment Prior Level of Function : Independent/Modified Independent;Driving               ADLs Comments: Ind with ADLs, IADLs, cooking prior to recent ST Rehab at [facility].  Did return home, fell.    OT Problem List: Decreased strength;Decreased knowledge of use of DME or AE;Decreased activity tolerance;Impaired balance (sitting and/or standing);Decreased safety awareness   OT Treatment/Interventions: Self-care/ADL training;Therapeutic exercise;Patient/family education;Balance training;Therapeutic activities;DME and/or AE instruction      OT Goals(Current goals can be found in the care plan section)   Acute Rehab OT Goals Patient Stated Goal: Return home OT Goal Formulation: With patient Time For Goal Achievement: 08/19/24 Potential to Achieve Goals: Good ADL Goals Pt Will Perform Grooming: with modified independence;standing Pt Will Perform Lower Body Dressing: with modified independence;sit to/from stand Pt Will Transfer to Toilet: with modified independence;ambulating;regular height toilet   OT Frequency:  Min 2X/week    Co-evaluation              AM-PAC OT 6 Clicks Daily Activity     Outcome Measure Help from another person eating meals?: None Help from another person taking care of personal grooming?: None Help from another person toileting, which includes using toliet, bedpan, or urinal?: A Lot Help from another person bathing (including washing, rinsing, drying)?: A Lot Help from another person to put on and taking off regular upper body clothing?: A Little Help from  another person to put on and taking off regular lower body clothing?: A Lot 6 Click Score: 17   End of Session Nurse Communication: Mobility status  Activity Tolerance: Patient limited by pain Patient left: in bed;with call bell/phone within reach  OT Visit Diagnosis: Unsteadiness on feet (R26.81);Other abnormalities of gait and mobility (R26.89);Muscle weakness (generalized) (M62.81)                Time: 8641-8584 OT Time Calculation (min): 17 min Charges:  OT General Charges $OT Visit: 1 Visit OT Evaluation $OT Eval Moderate Complexity: 1 Mod  08/05/2024  RP, OTR/L  Acute Rehabilitation Services  Office:  [phone]   Alison Whitehead 08/05/2024, 2:22 PM

## 2024-08-05 NOTE — Plan of Care (Addendum)
 Pt reporting constipation. Dr. Lee notified. Bowel regimen ordered and administered. Pt is passing gas. Abdomen is soft to palpation. No reports of nausea. Pt ate 100% of her daytime meals. Continuous IV fluids infusing.   00:39  Pt given 240cc warm prune juice to promote BM.   Problem: Education: Goal: Ability to describe self-care measures that may prevent or decrease complications (Diabetes Survival Skills Education) will improve Outcome: Progressing Goal: Individualized Educational Video(s) Outcome: Progressing   Problem: Coping: Goal: Ability to adjust to condition or change in health will improve Outcome: Progressing   Problem: Fluid Volume: Goal: Ability to maintain a balanced intake and output will improve Outcome: Progressing   Problem: Health Behavior/Discharge Planning: Goal: Ability to identify and utilize available resources and services will improve Outcome: Progressing Goal: Ability to manage health-related needs will improve Outcome: Progressing   Problem: Metabolic: Goal: Ability to maintain appropriate glucose levels will improve Outcome: Progressing   Problem: Nutritional: Goal: Maintenance of adequate nutrition will improve Outcome: Progressing Goal: Progress toward achieving an optimal weight will improve Outcome: Progressing   Problem: Skin Integrity: Goal: Risk for impaired skin integrity will decrease Outcome: Progressing   Problem: Tissue Perfusion: Goal: Adequacy of tissue perfusion will improve Outcome: Progressing   Problem: Education: Goal: Knowledge of General Education information will improve Description: Including pain rating scale, medication(s)/side effects and non-pharmacologic comfort measures Outcome: Progressing   Problem: Health Behavior/Discharge Planning: Goal: Ability to manage health-related needs will improve Outcome: Progressing   Problem: Clinical Measurements: Goal: Ability to maintain clinical measurements  within normal limits will improve Outcome: Progressing Goal: Will remain free from infection Outcome: Progressing Goal: Diagnostic test results will improve Outcome: Progressing Goal: Respiratory complications will improve Outcome: Progressing Goal: Cardiovascular complication will be avoided Outcome: Progressing   Problem: Activity: Goal: Risk for activity intolerance will decrease Outcome: Progressing   Problem: Nutrition: Goal: Adequate nutrition will be maintained Outcome: Progressing   Problem: Coping: Goal: Level of anxiety will decrease Outcome: Progressing   Problem: Elimination: Goal: Will not experience complications related to bowel motility Outcome: Progressing Goal: Will not experience complications related to urinary retention Outcome: Progressing   Problem: Pain Managment: Goal: General experience of comfort will improve and/or be controlled Outcome: Progressing   Problem: Safety: Goal: Ability to remain free from injury will improve Outcome: Progressing   Problem: Skin Integrity: Goal: Risk for impaired skin integrity will decrease Outcome: Progressing

## 2024-08-05 NOTE — TOC Initial Note (Addendum)
 Transition of Care [facility]) - Initial/Assessment Note    Patient Details  Name: Alison Whitehead MRN: [id number] Date of Birth: 12-23-1950  Transition of Care [facility]) CM/SW Contact:    Lauraine FORBES Saa, LCSWA Phone Number: 08/05/2024, 9:40 AM  Clinical Narrative:                  9:41 AM Per chart review, patient is from [facility] SNF (07/26/24-08/04/24). SNF confirmed patient is STR at SNF and will need insurance authorization prior to return. CSW sent patient's FL2 to SNF. Patient has a PCP and insurance on file. CSW will continue to follow.  2:17 PM MD informed CSW that patient is anticipated to be medically ready for discharge tomorrow. CSW made bedside LPN aware and stated SNF insurance authorization will be submitted once OT evaluation is inputted. CSW will continue to follow.  3:10 PM CSW requested CMA to submit SNF insurance authorization.  3:52 PM Patient's SNF insurance authorization is pending ([id number]).  Expected Discharge Plan: Skilled Nursing Facility Barriers to Discharge: Continued Medical Work up, English As A Second Language Teacher   Patient Goals and CMS Choice            Expected Discharge Plan and Services In-house Referral: Clinical Social Work     Living arrangements for the past 2 months: Apartment, Skilled Nursing Facility                                      Prior Living Arrangements/Services Living arrangements for the past 2 months: Apartment, Skilled Nursing Facility Lives with:: Self Patient language and need for interpreter reviewed:: Yes        Need for Family Participation in Patient Care: Yes (Comment) Care giver support system in place?: Yes (comment)   Criminal Activity/Legal Involvement Pertinent to Current Situation/Hospitalization: No - Comment as needed  Activities of Daily Living      Permission Sought/Granted Permission sought to share information with : Facility Medical Sales Representative, Family Supports Permission granted  to share information with : No (Contact information on chart)  Share Information with NAME: [name] Mau  Permission granted to share info w AGENCY: [facility] SNF STR  Permission granted to share info w Relationship: Daughter  Permission granted to share info w Contact Information: [phone]  Emotional Assessment   Attitude/Demeanor/Rapport: Unable to Assess Affect (typically observed): Unable to Assess Orientation: : Oriented to Self, Oriented to Place Alcohol / Substance Use: Not Applicable Psych Involvement: No (comment)  Admission diagnosis:  Dehydration [E86.0] General weakness [R53.1] Chronic anemia [D64.9] Primary osteoarthritis of right knee [M17.11] AKI (acute kidney injury) [N17.9] Accidental fall, initial encounter [T80.XXXA] Chronic pain of right knee [M25.561, G89.29] Acute pain of right knee [M25.561] Hypotension due to hypovolemia [E86.1] Patient Active Problem List   Diagnosis Date Noted   Debility 07/18/2024   Metabolic encephalopathy 07/18/2024   Primary osteoarthritis of right knee 07/18/2024   Shock (HCC) 07/13/2024   Total knee replacement status, 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   Rotator cuff syndrome, right 01/11/2020   Labile blood glucose    Vascular headache    Small vessel disease, cerebrovascular    Prediabetes    AKI (acute kidney injury)    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   Acute cerebrovascular accident (CVA) (HCC) 09/30/2019   Unilateral primary osteoarthritis, left knee 03/17/2019   Unilateral primary osteoarthritis, right knee 03/17/2019   Bilateral primary osteoarthritis of knee 03/10/2019   INSOMNIA, CHRONIC 05/19/2007  SPONDYLOSIS, LUMBAR 05/19/2007   Essential hypertension 05/03/2007   ARTHRITIS, KNEE 05/03/2007   PCP:  [name], PA-C Pharmacy:   [facility] [address] [place] [address] Phone: [phone] Fax: [phone]  [id number] [place], [place] - [address] AT [facility] [address] [place] [address] Phone: [phone] Fax: [phone]     Social Drivers of Health (SDOH) Social History: SDOH Screenings   Food Insecurity: No Food Insecurity (08/04/2024)  Housing: Low Risk (08/04/2024)  Transportation Needs: No Transportation Needs (08/04/2024)  Utilities: Not At Risk (08/04/2024)  Depression (PHQ2-9): Low Risk (07/02/2022)  Social Connections: Unknown (08/04/2024)  Recent Concern: Social Connections - Moderately Isolated (07/18/2024)  Tobacco Use: Medium Risk (08/04/2024)   SDOH Interventions:     Readmission Risk Interventions    12/28/2022    1:24 PM  Readmission Risk Prevention Plan  Post Dischage Appt Complete  Medication Screening Not Complete  Medication Screening Not Complete Comment N/A  Transportation Screening Not Complete  Transportation Screening Comment N/A

## 2024-08-05 NOTE — Evaluation (Signed)
 Physical Therapy Evaluation Patient Details Name: Alison Whitehead MRN: [id number] DOB: October 13, 1950 Today's Date: 08/05/2024  History of Present Illness  74 y.o. female presents to [facility] hospital on 08/05/2023 after a fall with R knee pain. Workup also notable for recurrent AKI. PMH includes CKD III, HTN, HLD, prediabetes.  Clinical Impression  Pt presents to PT with deficits in strength, power, gait, functional mobility, ROM. Pt reporting significant R knee pain with any AROM. Pt is able to sit at the edge of bed but is unable to transition to standing due to poor tolerance for weightbearing through RLE. PT notes edema around R knee joint and applies ice pack to aide in management of swelling. PT also encourages gentle AROM of R knee as tolerated. Currently the pt is unable to care for herself and lives alone. Patient will benefit from continued inpatient follow up therapy, <3 hours/day.        If plan is discharge home, recommend the following: A lot of help with walking and/or transfers;A lot of help with bathing/dressing/bathroom;Assistance with cooking/housework;Assist for transportation;Help with stairs or ramp for entrance   Can travel by private vehicle   No    Equipment Recommendations Wheelchair (measurements PT);Wheelchair cushion (measurements PT);BSC/3in1  Recommendations for Other Services       Functional Status Assessment Patient has had a recent decline in their functional status and demonstrates the ability to make significant improvements in function in a reasonable and predictable amount of time.     Precautions / Restrictions Precautions Precautions: Fall Recall of Precautions/Restrictions: Intact Restrictions Weight Bearing Restrictions Per Provider Order: No      Mobility  Bed Mobility Overal bed mobility: Needs Assistance Bed Mobility: Supine to Sit, Sit to Supine     Supine to sit: Min assist Sit to supine: Min assist   General bed mobility comments:  assist for RLE support, bed rails utilized    Transfers Overall transfer level: Needs assistance Equipment used: Rolling walker (2 wheels)               General transfer comment: pt attempts to initiate sit to stand twice but is unable to tolerate any pressure through RLE at this time, does not clear buttocks from bed    Ambulation/Gait                  Stairs            Wheelchair Mobility     Tilt Bed    Modified Rankin (Stroke Patients Only)       Balance Overall balance assessment: Needs assistance Sitting-balance support: No upper extremity supported, Feet supported Sitting balance-Leahy Scale: Fair                                       Pertinent Vitals/Pain Pain Assessment Pain Assessment: 0-10 Pain Score: 10-Worst pain ever Pain Location: R knee Pain Descriptors / Indicators: Sore Pain Intervention(s): Limited activity within patient's tolerance    Home Living Family/patient expects to be discharged to:: Private residence Living Arrangements: Alone Available Help at Discharge: Family;Available PRN/intermittently Type of Home: Apartment Home Access: Level entry       Home Layout: One level Home Equipment: Agricultural Consultant (2 wheels);Cane - single point Additional Comments: prior to admission, she was IND with ADLs, IADLs, driving and using a RW for mobility    Prior Function Prior Level of Function : Independent/Modified Independent;Driving  Mobility Comments: ambulatory with RW       Extremity/Trunk Assessment   Upper Extremity Assessment Upper Extremity Assessment: Overall WFL for tasks assessed    Lower Extremity Assessment Lower Extremity Assessment: RLE deficits/detail RLE Deficits / Details: pt with significant pain with at R knee with all AROM of RLE. Pt with poor tolerance for AROM at this time, demonstrating ~20 degrees of active knee flexion in supine. PROM to at least 90 degrees knee  flexion is observed in sitting. PT notes edema surrounding knee joint.    Cervical / Trunk Assessment Cervical / Trunk Assessment: Normal  Communication   Communication Communication: No apparent difficulties    Cognition Arousal: Alert Behavior During Therapy: WFL for tasks assessed/performed   PT - Cognitive impairments: No apparent impairments                         Following commands: Intact       Cueing Cueing Techniques: Verbal cues     General Comments General comments (skin integrity, edema, etc.): VSS on 2L [place]    Exercises General Exercises - Lower Extremity Heel Slides: AROM, Right, 5 reps (PT encourages gentle active knee flexion and extension as tolerated)   Assessment/Plan    PT Assessment Patient needs continued PT services  PT Problem List Decreased strength;Decreased range of motion;Decreased activity tolerance;Decreased balance;Decreased mobility;Decreased knowledge of use of DME;Pain       PT Treatment Interventions DME instruction;Gait training;Functional mobility training;Therapeutic activities;Therapeutic exercise;Balance training;Neuromuscular re-education;Patient/family education    PT Goals (Current goals can be found in the Care Plan section)  Acute Rehab PT Goals Patient Stated Goal: to reduce R knee pain, return to independence PT Goal Formulation: With patient Time For Goal Achievement: 08/19/24 Potential to Achieve Goals: Fair    Frequency Min 2X/week     Co-evaluation               AM-PAC PT 6 Clicks Mobility  Outcome Measure Help needed turning from your back to your side while in a flat bed without using bedrails?: A Little Help needed moving from lying on your back to sitting on the side of a flat bed without using bedrails?: A Little Help needed moving to and from a bed to a chair (including a wheelchair)?: Total Help needed standing up from a chair using your arms (e.g., wheelchair or bedside chair)?:  Total Help needed to walk in hospital room?: Total Help needed climbing 3-5 steps with a railing? : Total 6 Click Score: 10    End of Session   Activity Tolerance: Patient limited by pain Patient left: in bed;with call bell/phone within reach;with bed alarm set Nurse Communication: Mobility status PT Visit Diagnosis: Other abnormalities of gait and mobility (R26.89);Muscle weakness (generalized) (M62.81);Pain Pain - Right/Left: Right Pain - part of body: Knee    Time: 9141-9081 PT Time Calculation (min) (ACUTE ONLY): 20 min   Charges:   PT Evaluation $PT Eval Low Complexity: 1 Low   PT General Charges $$ ACUTE PT VISIT: 1 Visit         Bernardino JINNY Ruth, PT, DPT Acute Rehabilitation Office [phone]   Bernardino JINNY Ruth 08/05/2024, 9:37 AM

## 2024-08-05 NOTE — Plan of Care (Signed)
 Severe right knee pain throughout shift- unrelieved by scheduled tylenol , robaxin , & PRN oxycodone . On-call Dr. Lee notified. Lidocaine  patch ordered, placed on right knee: little to no relief. X1 2mg  IV morphine provided some relief but not for more than 30 minutes. X1 0.5mg  IV dilaudid  provided some relief between q4 PRN oxycodone  doses. Pt frequently screaming out in pain. Right knee does appear swollen. Swelling not noted to have worsened throughout shift.    Problem: Education: Goal: Ability to describe self-care measures that may prevent or decrease complications (Diabetes Survival Skills Education) will improve Outcome: Progressing Goal: Individualized Educational Video(s) Outcome: Progressing   Problem: Coping: Goal: Ability to adjust to condition or change in health will improve Outcome: Progressing   Problem: Fluid Volume: Goal: Ability to maintain a balanced intake and output will improve Outcome: Progressing   Problem: Health Behavior/Discharge Planning: Goal: Ability to identify and utilize available resources and services will improve Outcome: Progressing Goal: Ability to manage health-related needs will improve Outcome: Progressing   Problem: Metabolic: Goal: Ability to maintain appropriate glucose levels will improve Outcome: Progressing   Problem: Nutritional: Goal: Maintenance of adequate nutrition will improve Outcome: Progressing Goal: Progress toward achieving an optimal weight will improve Outcome: Progressing   Problem: Skin Integrity: Goal: Risk for impaired skin integrity will decrease Outcome: Progressing   Problem: Tissue Perfusion: Goal: Adequacy of tissue perfusion will improve Outcome: Progressing   Problem: Education: Goal: Knowledge of General Education information will improve Description: Including pain rating scale, medication(s)/side effects and non-pharmacologic comfort measures Outcome: Progressing   Problem: Health  Behavior/Discharge Planning: Goal: Ability to manage health-related needs will improve Outcome: Progressing   Problem: Clinical Measurements: Goal: Ability to maintain clinical measurements within normal limits will improve Outcome: Progressing Goal: Will remain free from infection Outcome: Progressing Goal: Diagnostic test results will improve Outcome: Progressing Goal: Respiratory complications will improve Outcome: Progressing Goal: Cardiovascular complication will be avoided Outcome: Progressing   Problem: Activity: Goal: Risk for activity intolerance will decrease Outcome: Progressing   Problem: Nutrition: Goal: Adequate nutrition will be maintained Outcome: Progressing   Problem: Coping: Goal: Level of anxiety will decrease Outcome: Progressing   Problem: Elimination: Goal: Will not experience complications related to bowel motility Outcome: Progressing Goal: Will not experience complications related to urinary retention Outcome: Progressing   Problem: Pain Managment: Goal: General experience of comfort will improve and/or be controlled Outcome: Progressing   Problem: Safety: Goal: Ability to remain free from injury will improve Outcome: Progressing   Problem: Skin Integrity: Goal: Risk for impaired skin integrity will decrease Outcome: Progressing

## 2024-08-06 ENCOUNTER — Inpatient Hospital Stay (HOSPITAL_COMMUNITY)

## 2024-08-06 DIAGNOSIS — N179 Acute kidney failure, unspecified: Secondary | ICD-10-CM

## 2024-08-06 LAB — COMPREHENSIVE METABOLIC PANEL WITH GFR
ALT: 16 U/L (ref 0–44)
AST: 18 U/L (ref 15–41)
Albumin: 3.5 g/dL (ref 3.5–5.0)
Alkaline Phosphatase: 90 U/L (ref 38–126)
Anion gap: 10 (ref 5–15)
BUN: 30 mg/dL — ABNORMAL HIGH (ref 8–23)
CO2: 19 mmol/L — ABNORMAL LOW (ref 22–32)
Calcium: 8.3 mg/dL — ABNORMAL LOW (ref 8.9–10.3)
Chloride: 109 mmol/L (ref 98–111)
Creatinine, Ser: 1.57 mg/dL — ABNORMAL HIGH (ref 0.44–1.00)
GFR, Estimated: 35 mL/min — ABNORMAL LOW
Glucose, Bld: 120 mg/dL — ABNORMAL HIGH (ref 70–99)
Potassium: 4.9 mmol/L (ref 3.5–5.1)
Sodium: 138 mmol/L (ref 135–145)
Total Bilirubin: 0.2 mg/dL (ref 0.0–1.2)
Total Protein: 6.5 g/dL (ref 6.5–8.1)

## 2024-08-06 LAB — SYNOVIAL CELL COUNT + DIFF, W/ CRYSTALS
Crystals, Fluid: NONE SEEN
Eosinophils-Synovial: 0 % (ref 0–1)
Lymphocytes-Synovial Fld: 3 % (ref 0–20)
Monocyte-Macrophage-Synovial Fluid: 13 % — ABNORMAL LOW (ref 50–90)
Neutrophil, Synovial: 84 % — ABNORMAL HIGH (ref 0–25)
WBC, Synovial: 530 /mm3 — ABNORMAL HIGH (ref 0–200)

## 2024-08-06 LAB — CBC
HCT: 26.7 % — ABNORMAL LOW (ref 36.0–46.0)
Hemoglobin: 7.8 g/dL — ABNORMAL LOW (ref 12.0–15.0)
MCH: 26.3 pg (ref 26.0–34.0)
MCHC: 29.2 g/dL — ABNORMAL LOW (ref 30.0–36.0)
MCV: 89.9 fL (ref 80.0–100.0)
Platelets: 238 K/uL (ref 150–400)
RBC: 2.97 MIL/uL — ABNORMAL LOW (ref 3.87–5.11)
RDW: 16.5 % — ABNORMAL HIGH (ref 11.5–15.5)
WBC: 6.7 K/uL (ref 4.0–10.5)
nRBC: 0.3 % — ABNORMAL HIGH (ref 0.0–0.2)

## 2024-08-06 LAB — GLUCOSE, CAPILLARY
Glucose-Capillary: 110 mg/dL — ABNORMAL HIGH (ref 70–99)
Glucose-Capillary: 170 mg/dL — ABNORMAL HIGH (ref 70–99)
Glucose-Capillary: 90 mg/dL (ref 70–99)

## 2024-08-06 MED ORDER — OXYCODONE HCL 5 MG PO TABS
5.0000 mg | ORAL_TABLET | Freq: Once | ORAL | Status: AC
  Administered 2024-08-06: 5 mg via ORAL
  Filled 2024-08-06: qty 1

## 2024-08-06 MED ORDER — BISACODYL 10 MG RE SUPP
10.0000 mg | Freq: Every day | RECTAL | Status: DC | PRN
  Filled 2024-08-06: qty 1

## 2024-08-06 MED ORDER — TRIAMCINOLONE ACETONIDE 40 MG/ML IJ SUSP
60.0000 mg | INTRAMUSCULAR | Status: AC
  Administered 2024-08-06: 60 mg via INTRA_ARTICULAR
  Filled 2024-08-06: qty 1.5

## 2024-08-06 MED ORDER — PENTAFLUOROPROP-TETRAFLUOROETH EX AERO: INHALATION_SPRAY | CUTANEOUS | Status: AC

## 2024-08-06 MED ORDER — BUPIVACAINE HCL (PF) 0.5 % IJ SOLN
10.0000 mL | INTRAMUSCULAR | Status: AC
  Administered 2024-08-06: 10 mL
  Filled 2024-08-06: qty 10

## 2024-08-06 MED ORDER — LIDOCAINE HCL (PF) 1 % IJ SOLN
30.0000 mL | Freq: Once | INTRAMUSCULAR | Status: AC
  Administered 2024-08-06: 30 mL
  Filled 2024-08-06: qty 30

## 2024-08-06 NOTE — Progress Notes (Addendum)
 " PROGRESS NOTE    Alison Whitehead  FMW:[id number] DOB: 1951/01/13 DOA: 08/04/2024 PCP: Alison Heron Ruth, PA-C   Brief Narrative:  74 y.o. F with obesity, HTN and CKD IIIb admitted 1 month ago for AKI requiring CRRT temporarily who returned with fall.  Found to have AKI.   In the hospital for 2 weeks last month, for AKI, required CRRT, Cr resolved to baseline 1.3-1.5, discharged to SNF.  Evidently discharged from rehab after less than a week, was home only a couple days then fell.   In the ER, reported dizziness, appeared dehydrated.  Cr back up to 3.3.  Also right knee swollen.  Admitted for AKI.  Assessment & Plan:   Principal Problem:   AKI (acute kidney injury)  AKI on CKD IIIb Baseline Cr 1.3-1.5 at the time of discharge 12/23 Cr on return back up to 3.1.  Appeared dehdyrated on exam, improved to 2.2 yesterday with fluids, BMP pending this morning. - Hold aspirin , furosemide , olmesartan  metformin  - Continue IV fluids - Trend Cr - Avoid hypotension and nephrotoxic agents.   Abnormal blood culture Strep spp in 1/2.  Possible contaminant.  Procal only 0.15, no fever or white count or constitutional symptoms - Hold Abx - Follow culture to speciation   Knee effusion Right knee osteoarthritis Dr. Jonel discussed with Ortho/Michael Reyes.  Per his recommendation, CT right knee was obtained and CT shows large effusion, severe arthritis, no fracture.  Unsure why arthrocentesis was not considered.  Patient has significant right knee pain, right knee effusion along with tenderness, patient is unable to flex her knee.  She definitely needs diagnostic and therapeutic arthrocentesis.  I am concerned about possible gout.  I have reached out to on-call orthopedics Dr. Beuford for consultation.  Addendum: I did not hear back from Dr. Beuford after sending him a secure chat and leaving voicemail to his cell phone.  I then reached out to you to call requesting arthrocentesis to  graciously accepted the consult.   Anemia of CKD Marked anemia, stable from last admission.  No bleeding reported or observed.   Hypertension BP soft - Hold amlodipine , olmesartan  - Hold Coreg , furosemide    Diabetes melitis type II - Hold metformin , Farxiga  - Continue SS corrections   Class 2 obesity - Hold Ozempic , resume at discharge.   Pancreatic insufficiency - Continue Creon   DVT prophylaxis: SCDs Start: 08/04/24 1509   Code Status: Full Code  Family Communication:  None present at bedside.  Plan of care discussed with patient in length and he/she verbalized understanding and agreed with it.  Status is: Inpatient Remains inpatient appropriate because: Significant right knee pain and effusion   Estimated body mass index is 37.08 kg/m as calculated from the following:   Height as of this encounter: 5' 7 (1.702 m).   Weight as of this encounter: 107.4 kg.    Nutritional Assessment: Body mass index is 37.08 kg/m.[name] Seen by dietician.  I agree with the assessment and plan as outlined below: Nutrition Status:        . Skin Assessment: I have examined the patient's skin and I agree with the wound assessment as performed by the wound care RN as outlined below:    Consultants:  Orthopedics  Procedures:  As above  Antimicrobials:  Anti-infectives (From admission, onward)    None         Subjective: Patient seen and examined, she complains of significant pain in the right knee.  No other complaint.  Objective: Vitals:  08/05/24 1619 08/05/24 2016 08/06/24 0400 08/06/24 0805  BP: 109/71 115/68 110/70 120/88  Pulse: 74 80 75 81  Resp: 18 18 16 17   Temp: 98.1 F (36.7 C) 98.3 F (36.8 C) 98.1 F (36.7 C) 98.4 F (36.9 C)  TempSrc: Oral Oral Oral Oral  SpO2: 94% 95% 94% 97%  Weight:      Height:        Intake/Output Summary (Last 24 hours) at 08/06/2024 1003 Last data filed at 08/06/2024 0500 Gross per 24 hour  Intake 1421.62 ml  Output 800 ml   Net 621.62 ml   Filed Weights   08/04/24 1718  Weight: 107.4 kg    Examination:  General exam: Appears calm and comfortable, morbidly obese Respiratory system: Clear to auscultation. Respiratory effort normal. Cardiovascular system: S1 & S2 heard, RRR. No JVD, murmurs, rubs, gallops or clicks. No pedal edema. Gastrointestinal system: Abdomen is nondistended, soft and nontender. No organomegaly or masses felt. Normal bowel sounds heard. Central nervous system: Alert and oriented. No focal neurological deficits. Extremities: Large right knee effusion with significant tenderness and restricted range of motion Skin: No rashes, lesions or ulcers Psychiatry: Judgement and insight appear normal. Mood & affect appropriate.    Data Reviewed: I have personally reviewed following labs and imaging studies  CBC: Recent Labs  Lab 08/04/24 1322 08/05/24 0452  WBC 9.8 6.7  HGB 8.3* 7.7*  HCT 29.3* 26.3*  MCV 91.8 90.7  PLT 291 258   Basic Metabolic Panel: Recent Labs  Lab 08/04/24 1322 08/05/24 0452  NA 136 139  K 5.3* 4.7  CL 104 107  CO2 21* 22  GLUCOSE 134* 108*  BUN 49* 40*  CREATININE 3.14* 2.28*  CALCIUM  8.3* 8.3*   GFR: Estimated Creatinine Clearance: 28.1 mL/min (A) (by C-G formula based on SCr of 2.28 mg/dL (H)). Liver Function Tests: No results for input(s): AST, ALT, ALKPHOS, BILITOT, PROT, ALBUMIN  in the last 168 hours. No results for input(s): LIPASE, AMYLASE in the last 168 hours. No results for input(s): AMMONIA in the last 168 hours. Coagulation Profile: No results for input(s): INR, PROTIME in the last 168 hours. Cardiac Enzymes: Recent Labs  Lab 08/04/24 1536  CKTOTAL 148   BNP (last 3 results) No results for input(s): PROBNP in the last 8760 hours. HbA1C: No results for input(s): HGBA1C in the last 72 hours. CBG: Recent Labs  Lab 08/05/24 0738 08/05/24 0816 08/05/24 1133 08/05/24 1625 08/06/24 0808  GLUCAP 124*  123* 127* 127* 110*   Lipid Profile: No results for input(s): CHOL, HDL, LDLCALC, TRIG, CHOLHDL, LDLDIRECT in the last 72 hours. Thyroid Function Tests: No results for input(s): TSH, T4TOTAL, FREET4, T3FREE, THYROIDAB in the last 72 hours. Anemia Panel: No results for input(s): VITAMINB12, FOLATE, FERRITIN, TIBC, IRON, RETICCTPCT in the last 72 hours. Sepsis Labs: Recent Labs  Lab 08/04/24 1536  PROCALCITON 0.15    Recent Results (from the past 240 hours)  Culture, blood (Routine X 2) w Reflex to ID Panel     Status: None (Preliminary result)   Collection Time: 08/04/24  3:30 PM   Specimen: BLOOD  Result Value Ref Range Status   Specimen Description BLOOD RIGHT ANTECUBITAL  Final   Special Requests   Final    BOTTLES DRAWN AEROBIC AND ANAEROBIC Blood Culture results may not be optimal due to an inadequate volume of blood received in culture bottles   Culture  Setup Time   Final    GRAM POSITIVE COCCI AEROBIC BOTTLE ONLY  CRITICAL RESULT CALLED TO, READ BACK BY AND VERIFIED WITH: MAYA VENETIA FALCON [phone] FCP Performed at [facility], [address], [place], [place] [address]    Culture GRAM POSITIVE COCCI  Final   Report Status PENDING  Incomplete  Blood Culture ID Panel (Reflexed)     Status: Abnormal   Collection Time: 08/04/24  3:30 PM  Result Value Ref Range Status   Enterococcus faecalis NOT DETECTED NOT DETECTED Final   Enterococcus Faecium NOT DETECTED NOT DETECTED Final   Listeria monocytogenes NOT DETECTED NOT DETECTED Final   Staphylococcus species NOT DETECTED NOT DETECTED Final   Staphylococcus aureus (BCID) NOT DETECTED NOT DETECTED Final   Staphylococcus epidermidis NOT DETECTED NOT DETECTED Final   Staphylococcus lugdunensis NOT DETECTED NOT DETECTED Final   Streptococcus species DETECTED (A) NOT DETECTED Final    Comment: Not Enterococcus species, Streptococcus agalactiae, Streptococcus pyogenes, or Streptococcus  pneumoniae. CRITICAL RESULT CALLED TO, READ BACK BY AND VERIFIED WITH: PHARMD JEREMY F [id number] FCP    Streptococcus agalactiae NOT DETECTED NOT DETECTED Final   Streptococcus pneumoniae NOT DETECTED NOT DETECTED Final   Streptococcus pyogenes NOT DETECTED NOT DETECTED Final   A.calcoaceticus-baumannii NOT DETECTED NOT DETECTED Final   Bacteroides fragilis NOT DETECTED NOT DETECTED Final   Enterobacterales NOT DETECTED NOT DETECTED Final   Enterobacter cloacae complex NOT DETECTED NOT DETECTED Final   Escherichia coli NOT DETECTED NOT DETECTED Final   Klebsiella aerogenes NOT DETECTED NOT DETECTED Final   Klebsiella oxytoca NOT DETECTED NOT DETECTED Final   Klebsiella pneumoniae NOT DETECTED NOT DETECTED Final   Proteus species NOT DETECTED NOT DETECTED Final   Salmonella species NOT DETECTED NOT DETECTED Final   Serratia marcescens NOT DETECTED NOT DETECTED Final   Haemophilus influenzae NOT DETECTED NOT DETECTED Final   Neisseria meningitidis NOT DETECTED NOT DETECTED Final   Pseudomonas aeruginosa NOT DETECTED NOT DETECTED Final   Stenotrophomonas maltophilia NOT DETECTED NOT DETECTED Final   Candida albicans NOT DETECTED NOT DETECTED Final   Candida auris NOT DETECTED NOT DETECTED Final   Candida glabrata NOT DETECTED NOT DETECTED Final   Candida krusei NOT DETECTED NOT DETECTED Final   Candida parapsilosis NOT DETECTED NOT DETECTED Final   Candida tropicalis NOT DETECTED NOT DETECTED Final   Cryptococcus neoformans/gattii NOT DETECTED NOT DETECTED Final    Comment: Performed at [facility], [address], [place], [place] [address]  Culture, blood (Routine X 2) w Reflex to ID Panel     Status: None (Preliminary result)   Collection Time: 08/04/24  7:20 PM   Specimen: BLOOD RIGHT HAND  Result Value Ref Range Status   Specimen Description BLOOD RIGHT HAND  Final   Special Requests   Final    BOTTLES DRAWN AEROBIC AND ANAEROBIC Blood Culture adequate volume   Culture    Final    NO GROWTH 2 DAYS Performed at [facility], [address], [place], [place] [address]    Report Status PENDING  Incomplete     Radiology Studies: CT KNEE RIGHT WO CONTRAST Result Date: 08/05/2024 CLINICAL DATA:  Knee pain.  Fall. EXAM: CT OF THE RIGHT KNEE WITHOUT CONTRAST TECHNIQUE: Multidetector CT imaging of the right knee was performed according to the standard protocol. Multiplanar CT image reconstructions were also generated. RADIATION DOSE REDUCTION: This exam was performed according to the departmental dose-optimization program which includes automated exposure control, adjustment of the mA and/or kV according to patient size and/or use  of iterative reconstruction technique. COMPARISON:  Right knee radiographs dated 08/04/2024. FINDINGS: Bones/Joint/Cartilage No acute fracture or dislocation. Redemonstrated severe tricompartmental osteoarthritis of the knee most pronounced in the medial femorotibial compartment with severe joint space narrowing resulting in near bone-on-bone contact, subchondral sclerosis, subchondral cystic changes, and marginal osteophytosis. Bulky fragmented superior patellar pole osteophytes are again noted. Similar mild lateral subluxation of the tibia relative to the femur. Large knee joint effusion. Ligaments Ligaments are suboptimally evaluated by CT. Muscles and Tendons No appreciable acute muscular abnormality. The patellar tendon is intact. Attenuated appearance of the distal quadriceps tendon. Partial-thickness tear can not be excluded. Soft tissue Subcutaneous edema along the anterior and anterolateral knee. No loculated fluid collection. IMPRESSION: 1. No acute osseous abnormality. 2. Severe tricompartmental osteoarthritis of the knee most pronounced in the medial femorotibial compartment resulting in near bone-on-bone contact. Similar mild lateral subluxation of the tibia relative to the femur. 3. Large knee joint effusion. 4. Attenuated appearance of  the distal quadriceps tendon. Partial-thickness tear can not be excluded. 5. Subcutaneous edema along the anterior and anterolateral knee. Electronically Signed   By: Harrietta Sherry M.D.   On: 08/05/2024 12:42   DG Knee Complete 4 Views Right Result Date: 08/04/2024 EXAM: 4 OR MORE VIEW(S) XRAY OF THE RIGHT KNEE 08/04/2024 11:32:00 AM COMPARISON: Right knee series dated 05/05/2023. CLINICAL HISTORY: Fall, pain. FINDINGS: BONES AND JOINTS: No acute fracture. There is severe tricompartmental osteoarthritis of the right knee with mild lateral subluxation of the tibial plateau relative to the femoral condyles. There is moderate joint space narrowing, particularly involving the medial tibial femoral compartment. There is sclerosis of the articular surfaces. There is patellofemoral osteophytosis. There is a mild suprapatellar bursal effusion. SOFT TISSUES: There are vascular calcifications. IMPRESSION: 1. 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 2. Mild suprapatellar bursal effusion. Electronically signed by: Evalene Coho MD 08/04/2024 12:31 PM EST RP Workstation: [id number]   DG HIP UNILAT W OR W/O PELVIS 2-3 VIEWS RIGHT Result Date: 08/04/2024 EXAM: 2 or 3 VIEW(S) XRAY OF THE RIGHT HIP 08/04/2024 11:32:00 AM COMPARISON: Bilateral hip series dated 02/29/2004. CLINICAL HISTORY: fall, pain FINDINGS: BONES AND JOINTS: There is a new, ovoid sclerotic density present superior laterally within the right femoral neck measuring approximately 15 x 11 mm. No acute fracture. No malalignment. There are mild degenerative changes within the sacroiliac joints bilaterally. SOFT TISSUES: There are numerous phleboliths within the pelvis. LUMBAR SPINE: There are mild  degenerative changes within the lower lumbar spine. IMPRESSION: 1. New ovoid sclerotic density in the superolateral right femoral neck measuring approximately 15 x 11 mm; indeterminate (bone island favored), consider follow-up imaging to document stability. 2. No acute fracture or dislocation. 3. Mild degenerative changes of the sacroiliac joints bilaterally and lower lumbar spine. Electronically signed by: Evalene Coho MD 08/04/2024 12:30 PM EST RP Workstation: [id number]    Scheduled Meds:  acetaminophen   1,000 mg Oral TID   atorvastatin   40 mg Oral Daily   feeding supplement (GLUCERNA SHAKE)  237 mL Oral TID BM   insulin  aspart  0-15 Units Subcutaneous TID WC   lidocaine   1 patch Transdermal Q24H   lipase/protease/amylase  36,000 Units Oral TID AC   melatonin  5 mg Oral QHS   methocarbamol   500 mg Oral TID   multivitamin  1 tablet Oral  QHS   pantoprazole   40 mg Oral Daily   polyethylene glycol  17 g Oral Daily   senna-docusate  1 tablet Oral BID   Continuous Infusions:  sodium chloride  100 mL/hr at 08/05/24 1818     LOS: 2 days   Fredia Skeeter, MD [facility]  08/06/2024, 10:03 AM   *Please note that this is a verbal dictation therefore any spelling or grammatical errors are due to the Dragon Medical One system interpretation.  Please page via Amion and do not message via secure chat for urgent patient care matters. Secure chat can be used for non urgent patient care matters.  How to contact the [facility] Attending or Consulting provider 7A - 7P or covering provider during after hours 7P -7A, for this patient?  Check the care team in [facility] and look for a) attending/consulting [facility] provider listed and b) the [facility] team listed. Page or secure chat 7A-7P. Log into [email or web address] and use [facility] universal password to access. If you do not have the password, please contact the hospital operator. Locate the [facility] provider you are looking for under [facility] and page to a  number that you can be directly reached. If you still have difficulty reaching the provider, please page the [facility] (Director on Call) for the Hospitalists listed on amion for assistance.  "

## 2024-08-06 NOTE — Procedures (Signed)
"   Orthopaedic Procedure note    Clinician: Francis MICAEL Mt, PA-C  Specimen: 25 cc inflammatory appearing synovial fluid (total of 50 cc of blood-tinged inflammatory fluid was aspirated)   Complications: none  Intra-articular medications: 1.5 cc of Kenalog  (40 mg/mL), 2 cc of 1% lidocaine  plain, 2 cc of 0.5% bupivacaine  plain  Pre-procedure diagnosis: R knee effusion  Post procedure diagnosis: R knee effusion   Description  The right knee joint was identified utilizing a superolateral approach.  Fluid was easily ballotable at this area.  The spot was cleansed with alcohol and then Betadine x 3.  The skin was anesthetized with ethyl chloride spray.  Once adequate adequate skin anesthesia was achieved an 18-gauge needle was inserted into the knee joint.  A palpable pop was noted with insertion of the needle.  As the needle was inserted the plunger on the syringe was withdrawn and immediately had return of blood-tinged inflammatory looking fluid.  The fluid did not have any evidence of purulence.  After the first syringe was filled a needle driver was attached to the hub on the needle to maintain position.  First syringe was removed and next syringe was placed on the hub of the needle.  An additional 25 cc of blood-tinged inflammatory fluid was withdrawn.  A total of approximately 50 cc of blood-tinged inflammatory fluid was withdrawn from the right knee joint.  Given the appearance of the fluid as well as the overall clinical picture I did elect to inject a steroid and local anesthetic mixture into the knee joint.  Approximately 1.5 cc of Kenalog  (40 mg/mL), 2 cc of 1% plain lidocaine  and 2 cc of 0.5% plain bupivacaine  was injected into the joint without difficulty.  Fluid injected smoothly without resistance.  The needle was withdrawn and a compressive dressing of 2 x 2 gauze and paper tape was applied to the aspiration site.  This fluid was sent down for stat Gram stain along with synovial fluid cell  count and crystals. Cultures were also obtained off of the sample. Patient tolerated the procedure well. Pt did not have any unexpected discomfort with the procedure.  We will follow-up on laboratory studies.  Patient is weightbearing as tolerated on her right leg.  Ice as needed for symptom management.  She can participate with therapy.    Francis MICAEL Mt, PA-C [phone] (C) 08/06/2024, 5:38 PM  [facility] [address] [place] [address] [phone] (F)      Patient ID: Alison Whitehead, female   DOB: January 13, 1951, 74 y.o.   MRN: [id number]  "

## 2024-08-06 NOTE — Plan of Care (Signed)

## 2024-08-06 NOTE — Consult Note (Addendum)
 "             Orthopaedic Trauma Service (OTS) Consult   Patient ID: Alison Whitehead MRN: [id number] DOB/AGE: 11/16/1950 74 y.o.   Reason for Consult: R knee effusion  Referring Physician: Fredia Skeeter, MD ([facility])   HPI: Alison Whitehead is an 74 y.o. female with medical history notable for CKD, DM, bilateral knee osteoarthritis, s/p left total knee arthroplasty in 2024, osteoporosis, hypertension who was recently admitted to [facility] about 1 month ago for acute kidney injury and was on CRRT and subsequently discharged to rehab.  She was discharged from rehab and was home less than a week when she states she fell out of bed hitting her face and right knee.  She was admitted once again for AKI/dehydration and right knee pain.  CT scan confirmed large effusion, no fractures but end-stage DJD.  Orthopedics consulted for right knee arthrocentesis.  She does not demonstrate any evidence of systemic illness or sepsis.  Request for arthrocentesis relief for symptom management of her right knee pain due to effusion  Patient seen and evaluated in 5 C20.  She again is about a year and a half post left total knee arthroplasty.  States that her recovery went well no issues.  She does live alone in an apartment.  She uses a walker and cane at baseline and upon further inquiry it sounds like she uses it more for her right knee.  She does not recall any effusion of significance preadmission but again her knee does hurt at baseline she has been thinking about a total knee arthroplasty but is not too eager to pursue this at this time.  Patient does not work, she is retired.  Previously worked at a [employer]  She denies any history of gout  No chronic anticoagulation  Patient is afebrile, no elevated white count, normal procalcitonin and normal CKs  Her vitals also look good, normotensive and regular heart rate with good O2 saturations on room air  Patient did have some abnormal  blood cultures but medicine feels that this is likely contaminant.  She is not on any antibiotics.  Following cultures  Past Medical History:  Diagnosis Date   Allergy    seasonal   Arthritis    CKD (chronic kidney disease)    DDD (degenerative disc disease)    Degenerative disc disease, lumbar    Hypertension    IBS (irritable bowel syndrome)    Osteopenia    Osteoporosis    osteopenia   Stroke (HCC) 09/2019    Past Surgical History:  Procedure Laterality Date   SHOULDER SURGERY Left    lipoma   TOTAL KNEE ARTHROPLASTY Left 12/26/2022   Procedure: LEFT TOTAL KNEE ARTHROPLASTY;  Surgeon: Harden Jerona GAILS, MD;  Location: [facility] OR;  Service: Orthopedics;  Laterality: Left;    Family History  Problem Relation Age of Onset   Breast cancer Maternal Aunt    Diabetes Mother    Hypertension Mother    Glaucoma Mother    Other Father        accident   Diabetes Sister    Hypertension Sister    Cancer Sister    Colon cancer Neg Hx    Colon polyps Neg Hx    Esophageal cancer Neg Hx    Stomach cancer Neg Hx    Rectal cancer Neg Hx     Social History:  reports that she quit smoking about 4 years ago. Her smoking use included cigarettes.  She has never used smokeless tobacco. She reports that she does not drink alcohol and does not use drugs.   Allergies: Allergies[1]  Medications: I have reviewed the patient's current medications. Active Medications[2]   Results for orders placed or performed during the hospital encounter of 08/04/24 (from the past 48 hours)  Culture, blood (Routine X 2) w Reflex to ID Panel     Status: None (Preliminary result)   Collection Time: 08/04/24  3:30 PM   Specimen: BLOOD  Result Value Ref Range   Specimen Description BLOOD RIGHT ANTECUBITAL    Special Requests      BOTTLES DRAWN AEROBIC AND ANAEROBIC Blood Culture results may not be optimal due to an inadequate volume of blood received in culture bottles   Culture  Setup Time      GRAM POSITIVE  COCCI AEROBIC BOTTLE ONLY CRITICAL RESULT CALLED TO, READ BACK BY AND VERIFIED WITH: PHARMD JEREMY F [id number] FCP    Culture      GRAM POSITIVE COCCI IDENTIFICATION AND SUSCEPTIBILITIES TO FOLLOW Performed at [facility], [address] [place], [place] [address]    Report Status PENDING   Blood Culture ID Panel (Reflexed)     Status: Abnormal   Collection Time: 08/04/24  3:30 PM  Result Value Ref Range   Enterococcus faecalis NOT DETECTED NOT DETECTED   Enterococcus Faecium NOT DETECTED NOT DETECTED   Listeria monocytogenes NOT DETECTED NOT DETECTED   Staphylococcus species NOT DETECTED NOT DETECTED   Staphylococcus aureus (BCID) NOT DETECTED NOT DETECTED   Staphylococcus epidermidis NOT DETECTED NOT DETECTED   Staphylococcus lugdunensis NOT DETECTED NOT DETECTED   Streptococcus species DETECTED (A) NOT DETECTED    Comment: Not Enterococcus species, Streptococcus agalactiae, Streptococcus pyogenes, or Streptococcus pneumoniae. CRITICAL RESULT CALLED TO, READ BACK BY AND VERIFIED WITH: PHARMD JEREMY F [id number] FCP    Streptococcus agalactiae NOT DETECTED NOT DETECTED   Streptococcus pneumoniae NOT DETECTED NOT DETECTED   Streptococcus pyogenes NOT DETECTED NOT DETECTED   A.calcoaceticus-baumannii NOT DETECTED NOT DETECTED   Bacteroides fragilis NOT DETECTED NOT DETECTED   Enterobacterales NOT DETECTED NOT DETECTED   Enterobacter cloacae complex NOT DETECTED NOT DETECTED   Escherichia coli NOT DETECTED NOT DETECTED   Klebsiella aerogenes NOT DETECTED NOT DETECTED   Klebsiella oxytoca NOT DETECTED NOT DETECTED   Klebsiella pneumoniae NOT DETECTED NOT DETECTED   Proteus species NOT DETECTED NOT DETECTED   Salmonella species NOT DETECTED NOT DETECTED   Serratia marcescens NOT DETECTED NOT DETECTED   Haemophilus influenzae NOT DETECTED NOT DETECTED   Neisseria meningitidis NOT DETECTED NOT DETECTED   Pseudomonas aeruginosa NOT DETECTED NOT DETECTED    Stenotrophomonas maltophilia NOT DETECTED NOT DETECTED   Candida albicans NOT DETECTED NOT DETECTED   Candida auris NOT DETECTED NOT DETECTED   Candida glabrata NOT DETECTED NOT DETECTED   Candida krusei NOT DETECTED NOT DETECTED   Candida parapsilosis NOT DETECTED NOT DETECTED   Candida tropicalis NOT DETECTED NOT DETECTED   Cryptococcus neoformans/gattii NOT DETECTED NOT DETECTED    Comment: Performed at [facility], [address] [place], [place] [address]  CK     Status: None   Collection Time: 08/04/24  3:36 PM  Result Value Ref Range   Total CK 148 38 - 234 U/L    Comment: Performed at [facility], [address] [place], [place] [address]  Procalcitonin     Status: None   Collection Time: 08/04/24  3:36  PM  Result Value Ref Range   Procalcitonin 0.15 ng/mL    Comment: (NOTE)   Sepsis PCT Algorithm          Lower Respiratory Tract Infection                                         PCT Algorithm -----------------------------------------------------------------  <0.5 ng/mL                    <0.10 ng/mL  Associated with low           Antibiotic therapy strongly   risk for progression          discouraged. Indicates absence   to severe sepsis              of bacteria infection  and/or septic shock             --------------------------------------------------------------  0.5-2.0 ng/mL                 0.10-0.25 ng/mL  Recommended to retest         Antibiotic therapy discouraged.  PCT within 6-24 hours         Bacterial infection unlikely  ------------------------------------------------------------  >2 ng/mL                      0.26-0.50 ng/mL  Associated with high risk     Antibiotic therapy encouraged.  for progression to severe     Bacterial infection possible  sepsis/and or septic shock    ------------------------------                                 >0.50 ng/mL                                Antibiotic therapy strongly                                  encouraged.                                Suggestive of presence of                                 bacterial infection.                                 -------------------------------------------------------------------  < or = 0.50 ng/mL OR          < or = 0.25 OR 80% decrease in PCT  80% decrease in PCT           Antibiotic therapy   Antibiotic therapy may        may be discontinued  be discontinued                                 Performed at [facility], [address], [place], [place] [address]   Glucose, capillary  Status: Abnormal   Collection Time: 08/04/24  5:00 PM  Result Value Ref Range   Glucose-Capillary 116 (H) 70 - 99 mg/dL    Comment: Glucose reference range applies only to samples taken after fasting for at least 8 hours.  Culture, blood (Routine X 2) w Reflex to ID Panel     Status: None (Preliminary result)   Collection Time: 08/04/24  7:20 PM   Specimen: BLOOD RIGHT HAND  Result Value Ref Range   Specimen Description BLOOD RIGHT HAND    Special Requests      BOTTLES DRAWN AEROBIC AND ANAEROBIC Blood Culture adequate volume   Culture      NO GROWTH 2 DAYS Performed at [facility], [address] [place], [place] [address]    Report Status PENDING   Glucose, capillary     Status: Abnormal   Collection Time: 08/04/24  8:33 PM  Result Value Ref Range   Glucose-Capillary 155 (H) 70 - 99 mg/dL    Comment: Glucose reference range applies only to samples taken after fasting for at least 8 hours.  Basic metabolic panel     Status: Abnormal   Collection Time: 08/05/24  4:52 AM  Result Value Ref Range   Sodium 139 135 - 145 mmol/L   Potassium 4.7 3.5 - 5.1 mmol/L   Chloride 107 98 - 111 mmol/L   CO2 22 22 - 32 mmol/L   Glucose, Bld 108 (H) 70 - 99 mg/dL    Comment: Glucose reference range applies only to samples taken after fasting for at least 8 hours.   BUN 40 (H) 8 - 23 mg/dL   Creatinine, Ser 7.71 (H) 0.44 - 1.00 mg/dL   Calcium  8.3 (L)  8.9 - 10.3 mg/dL   GFR, Estimated 22 (L) >60 mL/min    Comment: (NOTE) Calculated using the CKD-EPI Creatinine Equation (2021)    Anion gap 10 5 - 15    Comment: Performed at [facility], [address] [place], [place] [address]  CBC     Status: Abnormal   Collection Time: 08/05/24  4:52 AM  Result Value Ref Range   WBC 6.7 4.0 - 10.5 K/uL   RBC 2.90 (L) 3.87 - 5.11 MIL/uL   Hemoglobin 7.7 (L) 12.0 - 15.0 g/dL   HCT 73.6 (L) 63.9 - 53.9 %   MCV 90.7 80.0 - 100.0 fL   MCH 26.6 26.0 - 34.0 pg   MCHC 29.3 (L) 30.0 - 36.0 g/dL   RDW 83.6 (H) 88.4 - 84.4 %   Platelets 258 150 - 400 K/uL   nRBC 0.0 0.0 - 0.2 %    Comment: Performed at [facility], [address], [place], [place] [address]  Glucose, capillary     Status: Abnormal   Collection Time: 08/05/24  7:38 AM  Result Value Ref Range   Glucose-Capillary 124 (H) 70 - 99 mg/dL    Comment: Glucose reference range applies only to samples taken after fasting for at least 8 hours.  Glucose, capillary     Status: Abnormal   Collection Time: 08/05/24  8:16 AM  Result Value Ref Range   Glucose-Capillary 123 (H) 70 - 99 mg/dL    Comment: Glucose reference range applies only to samples taken after fasting for at least 8 hours.   Comment 1 Notify RN    Comment 2 Document in Chart   Glucose, capillary     Status: Abnormal   Collection Time: 08/05/24  11:33 AM  Result Value Ref Range   Glucose-Capillary 127 (H) 70 - 99 mg/dL    Comment: Glucose reference range applies only to samples taken after fasting for at least 8 hours.   Comment 1 Notify RN    Comment 2 Document in Chart   Glucose, capillary     Status: Abnormal   Collection Time: 08/05/24  4:25 PM  Result Value Ref Range   Glucose-Capillary 127 (H) 70 - 99 mg/dL    Comment: Glucose reference range applies only to samples taken after fasting for at least 8 hours.  Glucose, capillary     Status: Abnormal   Collection Time: 08/06/24  8:08 AM  Result Value Ref Range    Glucose-Capillary 110 (H) 70 - 99 mg/dL    Comment: Glucose reference range applies only to samples taken after fasting for at least 8 hours.  CBC     Status: Abnormal   Collection Time: 08/06/24 10:29 AM  Result Value Ref Range   WBC 6.7 4.0 - 10.5 K/uL   RBC 2.97 (L) 3.87 - 5.11 MIL/uL   Hemoglobin 7.8 (L) 12.0 - 15.0 g/dL   HCT 73.2 (L) 63.9 - 53.9 %   MCV 89.9 80.0 - 100.0 fL   MCH 26.3 26.0 - 34.0 pg   MCHC 29.2 (L) 30.0 - 36.0 g/dL   RDW 83.4 (H) 88.4 - 84.4 %   Platelets 238 150 - 400 K/uL   nRBC 0.3 (H) 0.0 - 0.2 %    Comment: Performed at [facility], [address], [place], [place] [address]  Comprehensive metabolic panel with GFR     Status: Abnormal   Collection Time: 08/06/24 10:29 AM  Result Value Ref Range   Sodium 138 135 - 145 mmol/L   Potassium 4.9 3.5 - 5.1 mmol/L   Chloride 109 98 - 111 mmol/L   CO2 19 (L) 22 - 32 mmol/L   Glucose, Bld 120 (H) 70 - 99 mg/dL    Comment: Glucose reference range applies only to samples taken after fasting for at least 8 hours.   BUN 30 (H) 8 - 23 mg/dL   Creatinine, Ser 8.42 (H) 0.44 - 1.00 mg/dL   Calcium  8.3 (L) 8.9 - 10.3 mg/dL   Total Protein 6.5 6.5 - 8.1 g/dL   Albumin  3.5 3.5 - 5.0 g/dL   AST 18 15 - 41 U/L   ALT 16 0 - 44 U/L   Alkaline Phosphatase 90 38 - 126 U/L   Total Bilirubin 0.2 0.0 - 1.2 mg/dL   GFR, Estimated 35 (L) >60 mL/min    Comment: (NOTE) Calculated using the CKD-EPI Creatinine Equation (2021)    Anion gap 10 5 - 15    Comment: Performed at [facility], [address], [place], [place] [address]  Glucose, capillary     Status: Abnormal   Collection Time: 08/06/24 12:41 PM  Result Value Ref Range   Glucose-Capillary 170 (H) 70 - 99 mg/dL    Comment: Glucose reference range applies only to samples taken after fasting for at least 8 hours.    CT KNEE RIGHT WO CONTRAST Result Date: 08/05/2024 CLINICAL DATA:  Knee pain.  Fall. EXAM: CT OF THE RIGHT KNEE WITHOUT CONTRAST TECHNIQUE:  Multidetector CT imaging of the right knee was performed according to the standard protocol. Multiplanar CT image reconstructions were also generated. RADIATION DOSE REDUCTION: This exam was performed according to the departmental dose-optimization program which includes automated exposure  control, adjustment of the mA and/or kV according to patient size and/or use of iterative reconstruction technique. COMPARISON:  Right knee radiographs dated 08/04/2024. FINDINGS: Bones/Joint/Cartilage No acute fracture or dislocation. Redemonstrated severe tricompartmental osteoarthritis of the knee most pronounced in the medial femorotibial compartment with severe joint space narrowing resulting in near bone-on-bone contact, subchondral sclerosis, subchondral cystic changes, and marginal osteophytosis. Bulky fragmented superior patellar pole osteophytes are again noted. Similar mild lateral subluxation of the tibia relative to the femur. Large knee joint effusion. Ligaments Ligaments are suboptimally evaluated by CT. Muscles and Tendons No appreciable acute muscular abnormality. The patellar tendon is intact. Attenuated appearance of the distal quadriceps tendon. Partial-thickness tear can not be excluded. Soft tissue Subcutaneous edema along the anterior and anterolateral knee. No loculated fluid collection. IMPRESSION: 1. No acute osseous abnormality. 2. Severe tricompartmental osteoarthritis of the knee most pronounced in the medial femorotibial compartment resulting in near bone-on-bone contact. Similar mild lateral subluxation of the tibia relative to the femur. 3. Large knee joint effusion. 4. Attenuated appearance of the distal quadriceps tendon. Partial-thickness tear can not be excluded. 5. Subcutaneous edema along the anterior and anterolateral knee. Electronically Signed   By: Harrietta Sherry M.D.   On: 08/05/2024 12:42    Intake/Output      01/02 0701 01/03 0700 01/03 0701 01/04 0700   P.O. 480 120   I.V.  (mL/kg) 941.6 (8.8)    IV Piggyback     Total Intake(mL/kg) 1421.6 (13.2) 120 (1.1)   Urine (mL/kg/hr) 800 (0.3) 500 (0.6)   Total Output 800 500   Net +621.6 -380        Urine Occurrence  1 x      ROS As above  R knee pain   Blood pressure 120/88, pulse 81, temperature 98.4 F (36.9 C), temperature source Oral, resp. rate 17, height 5' 7 (1.702 m), weight 107.4 kg, SpO2 97%. Physical Exam  Gen: Very pleasant black female, appears appropriate for stated age.  Very interactive and appropriate Lungs: Unlabored Cardiac: Regular  Right lower extremity  Moderate to large tense knee effusion  Diffusely tender about her right knee  No erythema or wounds noted  No erythema to her knee is noted  No DCT  Distal motor and sensory functions are grossly intact  Extremity is warm  + DP pulse  Hip, ankle and foot are unremarkable.  She does have chronic hallux valgus   Hallux valgus noted bilaterally, no erythema   Mildly tender on R      Assessment/Plan:  74 year old female status post fall with right knee pain and effusion, end-stage right knee DJD, history of left total knee arthroplasty for OA  -Fall  -Right knee pain with effusion, no fracture, end-stage DJD  Discussed various treatment options with the patient including expectant management versus knee arthrocentesis.  I recommend arthrocentesis given her pain.  I do think that removing some of the volume from her knee will help with her pain control and mobility   Sounds if at baseline she is having some right knee pain.  Would recommend her following back up with Dr. Harden for further discussion for total knee arthroplasty versus nonsurgical interventions such as viscosupplementation or steroid injections    Patient is agreeable with bedside knee arthrocentesis.  Verbal consent obtained   Total of about 50 cc of blood-tinged joint fluid was  aspirated from her right knee.  Look consistent with traumatic effusion in the  setting of advanced DJD.  Based off  clinical findings as well as chart review, particularly with her acute kidney injury which would preclude oral anti-inflammatories,  I did elect to inject the right knee with 60 mg of Kenalog  along with 2 cc of 1% plain lidocaine  and 2 cc of 0.5% plain bupivacaine  (see procedure note)   She did tolerate this quite well.   Recommend continued ice and elevation to the knee as well   It is okay for her to work with therapies, no restrictions with respect to her right knee   Synovial fluid will be sent down to the lab for cell count, crystals, Gram stain and culture  - Pain management:  Multimodal  Tylenol   Intra-articular steroid should help address her knee pain  Okay to use lidocaine  patches as well  NSAIDs contraindicated given her acute kidney injury  - ABL anemia/Hemodynamics  Her anemia likely related to her previous hospitalization which required CRRT  - Medical issues   Per primary   Patient may get a slight bump in her blood sugars due to her intra-articular steroid injection  - Dispo:  Will follow-up with the patient in the morning to see how she is doing    Francis MICAEL Mt, PA-C [phone] (C) 08/06/2024, 2:58 PM  [facility] [address] [place] [address] [phone] MAXIMINO MILLING (F)           After 5pm and on the weekends please log on to Amion, go to orthopaedics and the look under the [facility] Call for the provider(s) on call. You can also call our office at [phone] and then follow the prompts to be connected to the call team.      [1]  Allergies Allergen Reactions   Penicillins Itching  [2]  Current Facility-Administered Medications for the 08/04/24 encounter [facility] Encounter)  Medication   diclofenac  Sodium (VOLTAREN ) 1 % topical gel 2 g   Current Meds  Medication Sig   amLODipine -olmesartan  (AZOR ) 10-40 MG tablet Take 1 tablet by mouth daily.   aspirin  EC 81  MG tablet Take 1 tablet (81 mg total) by mouth daily.   atorvastatin  (LIPITOR) 40 MG tablet Take 1 tablet (40 mg total) by mouth daily.   carvedilol  (COREG ) 6.25 MG tablet Take 1 tablet (6.25 mg total) by mouth 2 (two) times daily.   CREON  36000-114000 units CPEP capsule Take 36,000 Units by mouth in the morning and at bedtime.   cyclobenzaprine  (FLEXERIL ) 5 MG tablet TAKE 1 TABLET BY MOUTH THREE TIMES A DAY AS NEEDED FOR MUSCLE SPASMS   dapagliflozin  propanediol (FARXIGA ) 10 MG TABS tablet Take 1 tablet (10 mg total) by mouth daily.   dicyclomine  (BENTYL ) 10 MG capsule Take 1-2 capsules (10-20 mg total) by mouth 4 (four) times daily as needed for spasms.   ferrous sulfate  325 (65 FE) MG tablet Take 325 mg by mouth daily with breakfast.   furosemide  (LASIX ) 20 MG tablet Take 20 mg by mouth every morning.   HYDROcodone -acetaminophen  (NORCO) 10-325 MG tablet Take 1 tablet by mouth every 6 (six) hours as needed for severe pain (pain score 7-10).   Melatonin 5 MG CAPS Take 5 mg by mouth at bedtime.   metFORMIN  (GLUCOPHAGE ) 1000 MG tablet Take 1,000 mg by mouth every evening.   naloxone (NARCAN) nasal spray 4 mg/0.1 mL    OZEMPIC , 2 MG/DOSE, 8 MG/3ML SOPN Inject 2 mg into the skin once a week.   [DISCONTINUED] amLODipine  (NORVASC ) 5 MG tablet Take 1 tablet (5 mg total)  by mouth daily.   "

## 2024-08-06 NOTE — Plan of Care (Signed)
 Pt reports still no BM. Asked for additional bowel regimen medications. Dr. Lee notified. Dulcolax suppository ordered. Pt able to have medium size soft brown BM on bedpan prior to admin of suppository. Pt requests to wait on suppository for now.   Problem: Education: Goal: Ability to describe self-care measures that may prevent or decrease complications (Diabetes Survival Skills Education) will improve Outcome: Progressing Goal: Individualized Educational Video(s) Outcome: Progressing   Problem: Coping: Goal: Ability to adjust to condition or change in health will improve Outcome: Progressing   Problem: Fluid Volume: Goal: Ability to maintain a balanced intake and output will improve Outcome: Progressing   Problem: Health Behavior/Discharge Planning: Goal: Ability to identify and utilize available resources and services will improve Outcome: Progressing Goal: Ability to manage health-related needs will improve Outcome: Progressing   Problem: Metabolic: Goal: Ability to maintain appropriate glucose levels will improve Outcome: Progressing   Problem: Nutritional: Goal: Maintenance of adequate nutrition will improve Outcome: Progressing Goal: Progress toward achieving an optimal weight will improve Outcome: Progressing   Problem: Skin Integrity: Goal: Risk for impaired skin integrity will decrease Outcome: Progressing   Problem: Tissue Perfusion: Goal: Adequacy of tissue perfusion will improve Outcome: Progressing   Problem: Education: Goal: Knowledge of General Education information will improve Description: Including pain rating scale, medication(s)/side effects and non-pharmacologic comfort measures Outcome: Progressing   Problem: Health Behavior/Discharge Planning: Goal: Ability to manage health-related needs will improve Outcome: Progressing   Problem: Clinical Measurements: Goal: Ability to maintain clinical measurements within normal limits will  improve Outcome: Progressing Goal: Will remain free from infection Outcome: Progressing Goal: Diagnostic test results will improve Outcome: Progressing Goal: Respiratory complications will improve Outcome: Progressing Goal: Cardiovascular complication will be avoided Outcome: Progressing   Problem: Activity: Goal: Risk for activity intolerance will decrease Outcome: Progressing   Problem: Nutrition: Goal: Adequate nutrition will be maintained Outcome: Progressing   Problem: Coping: Goal: Level of anxiety will decrease Outcome: Progressing   Problem: Elimination: Goal: Will not experience complications related to bowel motility Outcome: Progressing Goal: Will not experience complications related to urinary retention Outcome: Progressing   Problem: Pain Managment: Goal: General experience of comfort will improve and/or be controlled Outcome: Progressing   Problem: Safety: Goal: Ability to remain free from injury will improve Outcome: Progressing   Problem: Skin Integrity: Goal: Risk for impaired skin integrity will decrease Outcome: Progressing

## 2024-08-06 NOTE — Progress Notes (Signed)
 Physical Therapy Treatment Patient Details Name: Alison Whitehead MRN: [id number] DOB: 01/28/1951 Today's Date: 08/06/2024   History of Present Illness 74 y.o. female presents to [facility] hospital on 08/05/2023 after a fall with R knee pain. Workup also notable for recurrent AKI. PMH includes CKD III, HTN, HLD, prediabetes.    PT Comments  Pt significantly limited by pain and edema in R knee. Pt declining functional mobility at this time but is agreeable to bed level exercises. Pt tolerates L LE exercises well, though reports spasm in R LE during these exercises. Quad activation avoided in R LE due to level of pain. Pt tolerates glute squeezes well. PROM attempted but pt unable to tolerate. Pt also limited in UE exercises due to L shoulder pain following the fall. Tolerance improved when performed active assisted. Pt would benefit from continued PT services focused on strength, transfers, balance, and gait when appropriate to promote independence with functional mobility. Pt states she is waiting for fluid to be removed from joint.    If plan is discharge home, recommend the following: A lot of help with walking and/or transfers;A lot of help with bathing/dressing/bathroom;Assistance with cooking/housework;Assist for transportation;Help with stairs or ramp for entrance   Can travel by private vehicle     No  Equipment Recommendations  Rolling walker (2 wheels);BSC/3in1    Recommendations for Other Services       Precautions / Restrictions Precautions Precautions: Fall Recall of Precautions/Restrictions: Intact Restrictions Weight Bearing Restrictions Per Provider Order: No     Mobility  Bed Mobility               General bed mobility comments: Pt declining mobility at this time due to severity of knee pain, states she is waiting for the fluid to be drained. Agreeable to bed level exercises.    Transfers                        Ambulation/Gait                    Stairs             Wheelchair Mobility     Tilt Bed    Modified Rankin (Stroke Patients Only)       Balance       Sitting balance - Comments: Pt declining sitting EOB, bed level exercises completed.                                    Communication Communication Communication: No apparent difficulties Factors Affecting Communication: Hearing impaired  Cognition Arousal: Alert Behavior During Therapy: WFL for tasks assessed/performed, Restless   PT - Cognitive impairments: No apparent impairments                         Following commands: Intact      Cueing Cueing Techniques: Verbal cues  Exercises General Exercises - Upper Extremity Shoulder Flexion: AROM, Both, 5 reps, Supine (Reports pain in L shoulder from fall) Elbow Flexion: AROM, Both, 10 reps, Supine Elbow Extension: AROM, Both, 10 reps, Supine General Exercises - Lower Extremity Ankle Circles/Pumps: AROM, Both, 10 reps, Supine Quad Sets: AROM, Left, 5 reps, Supine Gluteal Sets: AROM, Both, 10 reps, Supine Other Exercises Other Exercises: Shoulder shrugs 1x10 bilaterally Other Exercises: Scap retractions 1x10 bilaterally    General Comments  Pertinent Vitals/Pain Pain Assessment Pain Assessment: Faces Faces Pain Scale: Hurts whole lot Pain Location: R knee Pain Descriptors / Indicators: Aching, Grimacing, Guarding, Moaning, Sore, Tightness, Sharp Pain Intervention(s): Limited activity within patient's tolerance, Repositioned, Monitored during session, Patient requesting pain meds-RN notified    Home Living                          Prior Function            PT Goals (current goals can now be found in the care plan section) Progress towards PT goals: Not progressing toward goals - comment (Limited by pain and edema, pending fluid removal due to joint effusion)    Frequency           PT Plan      Co-evaluation               AM-PAC PT 6 Clicks Mobility   Outcome Measure  Help needed turning from your back to your side while in a flat bed without using bedrails?: A Lot Help needed moving from lying on your back to sitting on the side of a flat bed without using bedrails?: A Lot Help needed moving to and from a bed to a chair (including a wheelchair)?: Total Help needed standing up from a chair using your arms (e.g., wheelchair or bedside chair)?: Total Help needed to walk in hospital room?: Total Help needed climbing 3-5 steps with a railing? : Total 6 Click Score: 8    End of Session   Activity Tolerance: Patient limited by pain Patient left: in bed;with call bell/phone within reach;with bed alarm set Nurse Communication: Mobility status PT Visit Diagnosis: Muscle weakness (generalized) (M62.81);Pain Pain - Right/Left: Right Pain - part of body: Knee     Time: 1351-1406 PT Time Calculation (min) (ACUTE ONLY): 15 min  Charges:    $Therapeutic Exercise: 8-22 mins PT General Charges $$ ACUTE PT VISIT: 1 Visit                     [name] Morel, PT, DPT  Acute Rehabilitation Services         Office: [phone]      [name] MARLA Morel 08/06/2024, 3:25 PM

## 2024-08-07 DIAGNOSIS — N179 Acute kidney failure, unspecified: Secondary | ICD-10-CM | POA: Diagnosis not present

## 2024-08-07 LAB — CBC WITH DIFFERENTIAL/PLATELET
Abs Immature Granulocytes: 0.13 K/uL — ABNORMAL HIGH (ref 0.00–0.07)
Basophils Absolute: 0 K/uL (ref 0.0–0.1)
Basophils Relative: 0 %
Eosinophils Absolute: 0 K/uL (ref 0.0–0.5)
Eosinophils Relative: 0 %
HCT: 27.9 % — ABNORMAL LOW (ref 36.0–46.0)
Hemoglobin: 8.3 g/dL — ABNORMAL LOW (ref 12.0–15.0)
Immature Granulocytes: 2 %
Lymphocytes Relative: 11 %
Lymphs Abs: 0.8 K/uL (ref 0.7–4.0)
MCH: 26.5 pg (ref 26.0–34.0)
MCHC: 29.7 g/dL — ABNORMAL LOW (ref 30.0–36.0)
MCV: 89.1 fL (ref 80.0–100.0)
Monocytes Absolute: 0.5 K/uL (ref 0.1–1.0)
Monocytes Relative: 6 %
Neutro Abs: 6.4 K/uL (ref 1.7–7.7)
Neutrophils Relative %: 81 %
Platelets: 287 K/uL (ref 150–400)
RBC: 3.13 MIL/uL — ABNORMAL LOW (ref 3.87–5.11)
RDW: 16.6 % — ABNORMAL HIGH (ref 11.5–15.5)
WBC: 7.9 K/uL (ref 4.0–10.5)
nRBC: 0.5 % — ABNORMAL HIGH (ref 0.0–0.2)

## 2024-08-07 LAB — GLUCOSE, CAPILLARY
Glucose-Capillary: 145 mg/dL — ABNORMAL HIGH (ref 70–99)
Glucose-Capillary: 148 mg/dL — ABNORMAL HIGH (ref 70–99)
Glucose-Capillary: 118 mg/dL — ABNORMAL HIGH (ref 70–99)
Glucose-Capillary: 154 mg/dL — ABNORMAL HIGH (ref 70–99)
Glucose-Capillary: 219 mg/dL — ABNORMAL HIGH (ref 70–99)

## 2024-08-07 LAB — BASIC METABOLIC PANEL WITH GFR
Anion gap: 10 (ref 5–15)
BUN: 24 mg/dL — ABNORMAL HIGH (ref 8–23)
CO2: 18 mmol/L — ABNORMAL LOW (ref 22–32)
Calcium: 8.6 mg/dL — ABNORMAL LOW (ref 8.9–10.3)
Chloride: 108 mmol/L (ref 98–111)
Creatinine, Ser: 1.26 mg/dL — ABNORMAL HIGH (ref 0.44–1.00)
GFR, Estimated: 45 mL/min — ABNORMAL LOW
Glucose, Bld: 185 mg/dL — ABNORMAL HIGH (ref 70–99)
Potassium: 5.1 mmol/L (ref 3.5–5.1)
Sodium: 136 mmol/L (ref 135–145)

## 2024-08-07 LAB — CULTURE, BLOOD (ROUTINE X 2)

## 2024-08-07 MED ORDER — ONDANSETRON HCL 4 MG/2ML IJ SOLN
4.0000 mg | Freq: Four times a day (QID) | INTRAMUSCULAR | Status: DC | PRN
  Administered 2024-08-07: 4 mg via INTRAVENOUS
  Filled 2024-08-07 (×3): qty 2

## 2024-08-07 NOTE — Progress Notes (Addendum)
 PHARMACY - PHYSICIAN COMMUNICATION CRITICAL VALUE ALERT - BLOOD CULTURE IDENTIFICATION (BCID)  Alison Whitehead is an 74 y.o. female who presented to [facility] on 08/04/2024 with a chief complaint of AKI.   Assessment:  Contacted by micro lab for correction of prior result - STREPTOCOCCUS PARASANGUINIS rather than PREVIOUSLY REPORTED AS: STREPTOCOCCUS SALIVARIUS.   Name of physician (or Provider) Contacted: Dr. Vernon   Current antibiotics: No antibiotics   Changes to prescribed antibiotics recommended:  Recommendations accepted by provider. Possibly contaminant.   Results for orders placed or performed during the hospital encounter of 08/04/24  Blood Culture ID Panel (Reflexed) (Collected: 08/04/2024  3:30 PM)  Result Value Ref Range   Enterococcus faecalis NOT DETECTED NOT DETECTED   Enterococcus Faecium NOT DETECTED NOT DETECTED   Listeria monocytogenes NOT DETECTED NOT DETECTED   Staphylococcus species NOT DETECTED NOT DETECTED   Staphylococcus aureus (BCID) NOT DETECTED NOT DETECTED   Staphylococcus epidermidis NOT DETECTED NOT DETECTED   Staphylococcus lugdunensis NOT DETECTED NOT DETECTED   Streptococcus species DETECTED (A) NOT DETECTED   Streptococcus agalactiae NOT DETECTED NOT DETECTED   Streptococcus pneumoniae NOT DETECTED NOT DETECTED   Streptococcus pyogenes NOT DETECTED NOT DETECTED   A.calcoaceticus-baumannii NOT DETECTED NOT DETECTED   Bacteroides fragilis NOT DETECTED NOT DETECTED   Enterobacterales NOT DETECTED NOT DETECTED   Enterobacter cloacae complex NOT DETECTED NOT DETECTED   Escherichia coli NOT DETECTED NOT DETECTED   Klebsiella aerogenes NOT DETECTED NOT DETECTED   Klebsiella oxytoca NOT DETECTED NOT DETECTED   Klebsiella pneumoniae NOT DETECTED NOT DETECTED   Proteus species NOT DETECTED NOT DETECTED   Salmonella species NOT DETECTED NOT DETECTED   Serratia marcescens NOT DETECTED NOT DETECTED   Haemophilus influenzae NOT DETECTED NOT DETECTED    Neisseria meningitidis NOT DETECTED NOT DETECTED   Pseudomonas aeruginosa NOT DETECTED NOT DETECTED   Stenotrophomonas maltophilia NOT DETECTED NOT DETECTED   Candida albicans NOT DETECTED NOT DETECTED   Candida auris NOT DETECTED NOT DETECTED   Candida glabrata NOT DETECTED NOT DETECTED   Candida krusei NOT DETECTED NOT DETECTED   Candida parapsilosis NOT DETECTED NOT DETECTED   Candida tropicalis NOT DETECTED NOT DETECTED   Cryptococcus neoformans/gattii NOT DETECTED NOT DETECTED    Rankin Sams 08/07/2024  11:04 AM

## 2024-08-07 NOTE — Progress Notes (Signed)
 Physical Therapy Treatment Patient Details Name: Alison Whitehead MRN: [id number] DOB: May 22, 1951 Today's Date: 08/07/2024   History of Present Illness 74 y.o. female presents to [facility] hospital on 08/05/2023 after a fall with R knee pain. Workup also notable for recurrent AKI. Pt underwent aspiration of 50ml fluid from R knee 1/3. PMH includes CKD III, HTN, HLD, prediabetes.    PT Comments  Despite undergoing aspiration of R knee yesterday pt remains to demonstrate R knee edema and extreme pain limiting ability to WB on R LE and tolerate R knee ROM. Pt did consent to PT trying to range R knee however limited tolerance but did improve with increased reps. Pt also reporting no memory of having BM last night despite night RN documenting it. Pt mobility continues to be limited by R knee pain. Acute PT to cont to follow and progress OOB mobility.   If plan is discharge home, recommend the following: A lot of help with walking and/or transfers;A lot of help with bathing/dressing/bathroom;Assistance with cooking/housework;Assist for transportation;Help with stairs or ramp for entrance   Can travel by private vehicle     No  Equipment Recommendations  Rolling walker (2 wheels);BSC/3in1    Recommendations for Other Services OT consult;Rehab consult;Other (comment)     Precautions / Restrictions Precautions Precautions: Fall Recall of Precautions/Restrictions: Intact Restrictions Weight Bearing Restrictions Per Provider Order: No     Mobility  Bed Mobility               General bed mobility comments: Pt declining OOB mobility at this time due to severity of knee pain    Transfers                   General transfer comment: did not attempt due to R knee pain    Ambulation/Gait               General Gait Details: unable this date due to R knee pain   Stairs             Wheelchair Mobility     Tilt Bed    Modified Rankin (Stroke Patients Only)        Balance                                            Communication Communication Communication: No apparent difficulties  Cognition Arousal: Alert Behavior During Therapy: WFL for tasks assessed/performed, Restless   PT - Cognitive impairments: No apparent impairments                       PT - Cognition Comments: pt reports not remembering screaming over night due to pain or having a BM overnight despite RN documentation. Following commands: Intact      Cueing Cueing Techniques: Verbal cues  Exercises General Exercises - Upper Extremity Shoulder Flexion:  (Reports pain in L shoulder from fall) General Exercises - Lower Extremity Ankle Circles/Pumps: AROM, Both, 10 reps, Supine Quad Sets: AROM, Left, Supine, 10 reps Gluteal Sets: AROM, Both, 10 reps, Supine Heel Slides: AROM, Right, 10 reps (PT encourages gentle active knee flexion and extension as tolerated, minimal ROM tolerated) Hip ABduction/ADduction: AAROM, Right, 10 reps, Supine    General Comments        Pertinent Vitals/Pain Pain Assessment Pain Assessment: 0-10 Pain Score: 8  Pain Location: R knee Pain Descriptors /  Indicators: Aching, Grimacing, Guarding, Moaning, Sore, Tightness, Sharp Pain Intervention(s): Limited activity within patient's tolerance    Home Living                          Prior Function            PT Goals (current goals can now be found in the care plan section) Acute Rehab PT Goals Patient Stated Goal: to reduce R knee pain, return to independence PT Goal Formulation: With patient Time For Goal Achievement: 08/19/24 Potential to Achieve Goals: Fair Progress towards PT goals: Not progressing toward goals - comment    Frequency    Min 2X/week      PT Plan      Co-evaluation              AM-PAC PT 6 Clicks Mobility   Outcome Measure  Help needed turning from your back to your side while in a flat bed without using bedrails?:  A Lot Help needed moving from lying on your back to sitting on the side of a flat bed without using bedrails?: A Lot Help needed moving to and from a bed to a chair (including a wheelchair)?: Total Help needed standing up from a chair using your arms (e.g., wheelchair or bedside chair)?: Total Help needed to walk in hospital room?: Total Help needed climbing 3-5 steps with a railing? : Total 6 Click Score: 8    End of Session Equipment Utilized During Treatment: Gait belt Activity Tolerance: Patient limited by pain Patient left: in bed;with call bell/phone within reach;with bed alarm set;with nursing/sitter in room Nurse Communication: Mobility status PT Visit Diagnosis: Muscle weakness (generalized) (M62.81);Pain Pain - Right/Left: Right Pain - part of body: Knee     Time: 0950-1005 PT Time Calculation (min) (ACUTE ONLY): 15 min  Charges:    $Therapeutic Exercise: 8-22 mins PT General Charges $$ ACUTE PT VISIT: 1 Visit                     Norene Ames, PT, DPT Acute Rehabilitation Services Secure chat preferred Office #: [phone]    Norene CHRISTELLA Ames 08/07/2024, 11:38 AM

## 2024-08-07 NOTE — Progress Notes (Signed)
 " PROGRESS NOTE    Alison Whitehead  FMW:[id number] DOB: 08-13-1950 DOA: 08/04/2024 PCP: Alison Heron Ruth, PA-C   Brief Narrative:  74 y.o. F with obesity, HTN and CKD IIIb admitted 1 month ago for AKI requiring CRRT temporarily who returned with fall.  Found to have AKI.   In the hospital for 2 weeks last month, for AKI, required CRRT, Cr resolved to baseline 1.3-1.5, discharged to SNF.  Evidently discharged from rehab after less than a week, was home only a couple days then fell.   In the ER, reported dizziness, appeared dehydrated.  Cr back up to 3.3.  Also right knee swollen.  Admitted for AKI.  Assessment & Plan:   Principal Problem:   AKI (acute kidney injury)  AKI on CKD IIIb Baseline Cr 1.3-1.5 at the time of discharge 12/23 Cr on return back up to 3.1.  Appeared dehdyrated on exam, improved to 1.57 yesterday which is her baseline.  Continue to hold aspirin , furosemide , olmesartan  metformin - Avoid hypotension and nephrotoxic agents.   Abnormal blood culture Strep spp in 1/2.  Possible contaminant.  Procal only 0.15, no fever or white count or constitutional symptoms - Hold Abx - Follow culture to speciation   Knee effusion Right knee osteoarthritis Dr. Jonel discussed with Ortho/Alison Whitehead.  Per his recommendation, CT right knee was obtained and CT shows large effusion, severe arthritis, no fracture.  Unsure why arthrocentesis was not considered.  She had very large knee effusion, I consulted orthopedics, she underwent arthrocentesis with removal of bloody 50 cc fluid.  Also Kenalog  was injected.  Fluid analysis negative for crystals and infection.  Although patient still complains of no improvement after arthrocentesis but her tenderness is definitely much improved.  Per orthopedics, it may take 1-2 more days for her to feel better.   Anemia of CKD Marked anemia, stable from last admission.  No bleeding reported or observed.   Hypertension  amlodipine , Coreg ,  Lasix  and olmesartan  on hold and blood pressure within normal range.   Diabetes melitis type II - Hold metformin , Farxiga  - Continue SS corrections   Class 2 obesity - Hold Ozempic , resume at discharge.   Pancreatic insufficiency - Continue Creon   DVT prophylaxis: SCDs Start: 08/04/24 1509   Code Status: Full Code  Family Communication:  None present at bedside.  Plan of care discussed with patient in length and he/she verbalized understanding and agreed with it.  Status is: Inpatient Remains inpatient appropriate because: Alison Whitehead stable, pending placement.   Estimated body mass index is 37.08 kg/m as calculated from the following:   Height as of this encounter: 5' 7 (1.702 m).   Weight as of this encounter: 107.4 kg.    Nutritional Assessment: Body mass index is 37.08 kg/m.Alison Whitehead Seen by dietician.  I agree with the assessment and plan as outlined below: Nutrition Status:        . Skin Assessment: I have examined the patient's skin and I agree with the wound assessment as performed by the wound care RN as outlined below:    Consultants:  Orthopedics  Procedures:  As above  Antimicrobials:  Anti-infectives (From admission, onward)    None         Subjective: Patient seen and examined.  Although she appears much better and smiling today.  She still complains of right knee pain and claims that it is no better than yesterday.  Objective: Vitals:   08/06/24 1631 08/06/24 1923 08/07/24 0442 08/07/24 0730  BP: (!) 140/96 129/82  130/80 134/85  Pulse: 79 76 78 80  Resp: 20 20 18    Temp: 98.2 F (36.8 C) 98.1 F (36.7 C) 98.3 F (36.8 C) 97.8 F (36.6 C)  TempSrc: Oral Oral Oral Oral  SpO2: 92% 92% 92% 98%  Weight:      Height:        Intake/Output Summary (Last 24 hours) at 08/07/2024 1104 Last data filed at 08/07/2024 0300 Gross per 24 hour  Intake 2233.32 ml  Output 1450 ml  Net 783.32 ml   Filed Weights   08/04/24 1718  Weight: 107.4 kg     Examination:  General exam: Appears calm and comfortable  Respiratory system: Clear to auscultation. Respiratory effort normal. Cardiovascular system: S1 & S2 heard, RRR. No JVD, murmurs, rubs, gallops or clicks. No pedal edema. Gastrointestinal system: Abdomen is nondistended, soft and nontender. No organomegaly or masses felt. Normal bowel sounds heard. Central nervous system: Alert and oriented. No focal neurological deficits. Extremities: Mild tenderness at the left knee due to osteoarthritis.  Has severe osteoarthritis on the right knee and effusion has significantly improved since arthrocentesis.  Still with tenderness which is also improved. Skin: No rashes, lesions or ulcers.  Psychiatry: Judgement and insight appear normal. Mood & affect appropriate.   Data Reviewed: I have personally reviewed following labs and imaging studies  CBC: Recent Labs  Lab 08/04/24 1322 08/05/24 0452 08/06/24 1029  WBC 9.8 6.7 6.7  HGB 8.3* 7.7* 7.8*  HCT 29.3* 26.3* 26.7*  MCV 91.8 90.7 89.9  PLT 291 258 238   Basic Metabolic Panel: Recent Labs  Lab 08/04/24 1322 08/05/24 0452 08/06/24 1029  NA 136 139 138  K 5.3* 4.7 4.9  CL 104 107 109  CO2 21* 22 19*  GLUCOSE 134* 108* 120*  BUN 49* 40* 30*  CREATININE 3.14* 2.28* 1.57*  CALCIUM  8.3* 8.3* 8.3*   GFR: Estimated Creatinine Clearance: 40.9 mL/min (A) (by C-G formula based on SCr of 1.57 mg/dL (H)). Liver Function Tests: Recent Labs  Lab 08/06/24 1029  AST 18  ALT 16  ALKPHOS 90  BILITOT 0.2  PROT 6.5  ALBUMIN  3.5   No results for input(s): LIPASE, AMYLASE in the last 168 hours. No results for input(s): AMMONIA in the last 168 hours. Coagulation Profile: No results for input(s): INR, PROTIME in the last 168 hours. Cardiac Enzymes: Recent Labs  Lab 08/04/24 1536  CKTOTAL 148   BNP (last 3 results) No results for input(s): PROBNP in the last 8760 hours. HbA1C: No results for input(s): HGBA1C in  the last 72 hours. CBG: Recent Labs  Lab 08/06/24 0808 08/06/24 1241 08/06/24 1631 08/07/24 0331 08/07/24 0730  GLUCAP 110* 170* 90 145* 148*   Lipid Profile: No results for input(s): CHOL, HDL, LDLCALC, TRIG, CHOLHDL, LDLDIRECT in the last 72 hours. Thyroid Function Tests: No results for input(s): TSH, T4TOTAL, FREET4, T3FREE, THYROIDAB in the last 72 hours. Anemia Panel: No results for input(s): VITAMINB12, FOLATE, FERRITIN, TIBC, IRON, RETICCTPCT in the last 72 hours. Sepsis Labs: Recent Labs  Lab 08/04/24 1536  PROCALCITON 0.15    Recent Results (from the past 240 hours)  Culture, blood (Routine X 2) w Reflex to ID Panel     Status: Abnormal (Preliminary result)   Collection Time: 08/04/24  3:30 PM   Specimen: BLOOD  Result Value Ref Range Status   Specimen Description BLOOD RIGHT ANTECUBITAL  Final   Special Requests   Final    BOTTLES DRAWN AEROBIC AND ANAEROBIC Blood  Culture results may not be optimal due to an inadequate volume of blood received in culture bottles   Culture  Setup Time   Final    GRAM POSITIVE COCCI AEROBIC BOTTLE ONLY CRITICAL RESULT CALLED TO, READ BACK BY AND VERIFIED WITH: PHARMD JEREMY F [id number] FCP    Culture (A)  Final    STREPTOCOCCUS SALIVARIUS SUSCEPTIBILITIES TO FOLLOW Performed at [facility], [address] [place], [place] [address]    Report Status PENDING  Incomplete  Blood Culture ID Panel (Reflexed)     Status: Abnormal   Collection Time: 08/04/24  3:30 PM  Result Value Ref Range Status   Enterococcus faecalis NOT DETECTED NOT DETECTED Final   Enterococcus Faecium NOT DETECTED NOT DETECTED Final   Listeria monocytogenes NOT DETECTED NOT DETECTED Final   Staphylococcus species NOT DETECTED NOT DETECTED Final   Staphylococcus aureus (BCID) NOT DETECTED NOT DETECTED Final   Staphylococcus epidermidis NOT DETECTED NOT DETECTED Final   Staphylococcus lugdunensis NOT DETECTED NOT  DETECTED Final   Streptococcus species DETECTED (A) NOT DETECTED Final    Comment: Not Enterococcus species, Streptococcus agalactiae, Streptococcus pyogenes, or Streptococcus pneumoniae. CRITICAL RESULT CALLED TO, READ BACK BY AND VERIFIED WITH: PHARMD JEREMY F [id number] FCP    Streptococcus agalactiae NOT DETECTED NOT DETECTED Final   Streptococcus pneumoniae NOT DETECTED NOT DETECTED Final   Streptococcus pyogenes NOT DETECTED NOT DETECTED Final   A.calcoaceticus-baumannii NOT DETECTED NOT DETECTED Final   Bacteroides fragilis NOT DETECTED NOT DETECTED Final   Enterobacterales NOT DETECTED NOT DETECTED Final   Enterobacter cloacae complex NOT DETECTED NOT DETECTED Final   Escherichia coli NOT DETECTED NOT DETECTED Final   Klebsiella aerogenes NOT DETECTED NOT DETECTED Final   Klebsiella oxytoca NOT DETECTED NOT DETECTED Final   Klebsiella pneumoniae NOT DETECTED NOT DETECTED Final   Proteus species NOT DETECTED NOT DETECTED Final   Salmonella species NOT DETECTED NOT DETECTED Final   Serratia marcescens NOT DETECTED NOT DETECTED Final   Haemophilus influenzae NOT DETECTED NOT DETECTED Final   Neisseria meningitidis NOT DETECTED NOT DETECTED Final   Pseudomonas aeruginosa NOT DETECTED NOT DETECTED Final   Stenotrophomonas maltophilia NOT DETECTED NOT DETECTED Final   Candida albicans NOT DETECTED NOT DETECTED Final   Candida auris NOT DETECTED NOT DETECTED Final   Candida glabrata NOT DETECTED NOT DETECTED Final   Candida krusei NOT DETECTED NOT DETECTED Final   Candida parapsilosis NOT DETECTED NOT DETECTED Final   Candida tropicalis NOT DETECTED NOT DETECTED Final   Cryptococcus neoformans/gattii NOT DETECTED NOT DETECTED Final    Comment: Performed at [facility], [address] [place], [place] [address]  Culture, blood (Routine X 2) w Reflex to ID Panel     Status: None (Preliminary result)   Collection Time: 08/04/24  7:20 PM   Specimen: BLOOD RIGHT HAND  Result  Value Ref Range Status   Specimen Description BLOOD RIGHT HAND  Final   Special Requests   Final    BOTTLES DRAWN AEROBIC AND ANAEROBIC Blood Culture adequate volume   Culture   Final    NO GROWTH 3 DAYS Performed at [facility], [address], [place], [place] [address]    Report Status PENDING  Incomplete  Aerobic/Anaerobic Culture w Gram Stain (surgical/deep wound)     Status: None (Preliminary result)   Collection Time: 08/06/24 12:17 PM   Specimen: Path fluid  Result Value Ref Range Status   Specimen Description FLUID  Final   Special Requests SYNOVIAL,RIGHT KNEE  Final   Gram Stain PENDING  Incomplete   Culture   Final    NO GROWTH < 24 HOURS Performed at [facility], [address] [place], [place] [address]    Report Status PENDING  Incomplete     Radiology Studies: DG Foot Complete Right Result Date: 08/06/2024 EXAM: 3 OR MORE VIEW(S) XRAY OF THE RIGHT FOOT 08/06/2024 04:14:00 PM COMPARISON: None available. CLINICAL HISTORY: Hallux valgus (acquired), right foot. FINDINGS: BONES AND JOINTS: Hallux valgus (acquired), right foot. Degenerative changes in the first metatarsophalangeal joint. Degenerative changes in the intertarsal joints. No acute fracture. No dislocation. No focal bone erosion or bone destruction. SOFT TISSUES: Mild overlying soft tissue swelling. No radiopaque soft tissue foreign bodies or soft tissue gas. IMPRESSION: 1. Degenerative changes in the first metatarsophalangeal and intertarsal joints. 2. No acute fracture or dislocation. 3. Mild overlying soft tissue swelling without radiopaque soft tissue foreign body or soft tissue gas. Electronically signed by: Elsie Gravely MD 08/06/2024 08:19 PM EST RP Workstation: [id number]   CT KNEE RIGHT WO CONTRAST Result Date: 08/05/2024 CLINICAL DATA:  Knee pain.  Fall. EXAM: CT OF THE RIGHT KNEE WITHOUT CONTRAST TECHNIQUE: Multidetector CT imaging of the right knee was performed according to the standard  protocol. Multiplanar CT image reconstructions were also generated. RADIATION DOSE REDUCTION: This exam was performed according to the departmental dose-optimization program which includes automated exposure control, adjustment of the mA and/or kV according to patient size and/or use of iterative reconstruction technique. COMPARISON:  Right knee radiographs dated 08/04/2024. FINDINGS: Bones/Joint/Cartilage No acute fracture or dislocation. Redemonstrated severe tricompartmental osteoarthritis of the knee most pronounced in the medial femorotibial compartment with severe joint space narrowing resulting in near bone-on-bone contact, subchondral sclerosis, subchondral cystic changes, and marginal osteophytosis. Bulky fragmented superior patellar pole osteophytes are again noted. Similar mild lateral subluxation of the tibia relative to the femur. Large knee joint effusion. Ligaments Ligaments are suboptimally evaluated by CT. Muscles and Tendons No appreciable acute muscular abnormality. The patellar tendon is intact. Attenuated appearance of the distal quadriceps tendon. Partial-thickness tear can not be excluded. Soft tissue Subcutaneous edema along the anterior and anterolateral knee. No loculated fluid collection. IMPRESSION: 1. No acute osseous abnormality. 2. Severe tricompartmental osteoarthritis of the knee most pronounced in the medial femorotibial compartment resulting in near bone-on-bone contact. Similar mild lateral subluxation of the tibia relative to the femur. 3. Large knee joint effusion. 4. Attenuated appearance of the distal quadriceps tendon. Partial-thickness tear can not be excluded. 5. Subcutaneous edema along the anterior and anterolateral knee. Electronically Signed   By: Harrietta Sherry M.D.   On: 08/05/2024 12:42    Scheduled Meds:  acetaminophen   1,000 mg Oral TID   atorvastatin   40 mg Oral Daily   feeding supplement (GLUCERNA SHAKE)  237 mL Oral TID BM   insulin  aspart  0-15 Units  Subcutaneous TID WC   lidocaine   1 patch Transdermal Q24H   lipase/protease/amylase  36,000 Units Oral TID AC   melatonin  5 mg Oral QHS   methocarbamol   500 mg Oral TID   multivitamin  1 tablet Oral QHS   pantoprazole   40 mg Oral Daily   polyethylene glycol  17 g Oral Daily   senna-docusate  1 tablet Oral BID   Continuous Infusions:  sodium chloride  100 mL/hr at 08/06/24 1800     LOS: 3 days   Fredia Skeeter, MD [facility]  08/07/2024, 11:04 AM   *  Please note that this is a verbal dictation therefore any spelling or grammatical errors are due to the Dragon Medical One system interpretation.  Please page via Amion and do not message via secure chat for urgent patient care matters. Secure chat can be used for non urgent patient care matters.  How to contact the [facility] Attending or Consulting provider 7A - 7P or covering provider during after hours 7P -7A, for this patient?  Check the care team in [facility] and look for a) attending/consulting [facility] provider listed and b) the [facility] team listed. Page or secure chat 7A-7P. Log into [email or web address] and use [place]'s universal password to access. If you do not have the password, please contact the hospital operator. Locate the [facility] provider you are looking for under [facility] and page to a number that you can be directly reached. If you still have difficulty reaching the provider, please page the [facility] (Director on Call) for the Hospitalists listed on amion for assistance.  "

## 2024-08-07 NOTE — Plan of Care (Signed)

## 2024-08-07 NOTE — Progress Notes (Signed)
 "                                                                         Orthopaedic Trauma Service Progress Note  Patient ID: Alison Whitehead MRN: [id number] DOB/AGE: 12/03/1950 74 y.o.  Subjective:  Doing ok this am  Still reports R knee pain but thinks it might be a little better   Synovial fluid analysis is unremarkable today.  No crystals noted no findings and cultures to date.  WBCs only 530 cells per cubic millimeter  ROS As above  Today's  total administered Morphine  Milligram Equivalents: 15 Yesterday's total administered Morphine  Milligram Equivalents: 37.5  Objective:   VITALS:   Vitals:   08/06/24 1631 08/06/24 1923 08/07/24 0442 08/07/24 0730  BP: (!) 140/96 129/82 130/80 134/85  Pulse: 79 76 78 80  Resp: 20 20 18    Temp: 98.2 F (36.8 C) 98.1 F (36.7 C) 98.3 F (36.8 C) 97.8 F (36.6 C)  TempSrc: Oral Oral Oral Oral  SpO2: 92% 92% 92% 98%  Weight:      Height:        Estimated body mass index is 37.08 kg/m as calculated from the following:   Height as of this encounter: 5' 7 (1.702 m).   Weight as of this encounter: 107.4 kg.   Intake/Output      01/03 0701 01/04 0700 01/04 0701 01/05 0700   P.O. 920    I.V. (mL/kg) 2027.9 (18.9)    Total Intake(mL/kg) 2947.9 (27.4)    Urine (mL/kg/hr) 1950 (0.8)    Stool 0    Total Output 1950    Net +997.9         Urine Occurrence 3 x    Stool Occurrence 1 x      LABS  Results for orders placed or performed during the hospital encounter of 08/04/24 (from the past 24 hours)  CBC     Status: Abnormal   Collection Time: 08/06/24 10:29 AM  Result Value Ref Range   WBC 6.7 4.0 - 10.5 K/uL   RBC 2.97 (L) 3.87 - 5.11 MIL/uL   Hemoglobin 7.8 (L) 12.0 - 15.0 g/dL   HCT 73.2 (L) 63.9 - 53.9 %   MCV 89.9 80.0 - 100.0 fL   MCH 26.3 26.0 - 34.0 pg   MCHC 29.2 (L) 30.0 - 36.0 g/dL   RDW 83.4 (H) 88.4 - 84.4 %   Platelets 238 150 - 400 K/uL   nRBC 0.3 (H) 0.0 - 0.2 %  Comprehensive metabolic panel  with GFR     Status: Abnormal   Collection Time: 08/06/24 10:29 AM  Result Value Ref Range   Sodium 138 135 - 145 mmol/L   Potassium 4.9 3.5 - 5.1 mmol/L   Chloride 109 98 - 111 mmol/L   CO2 19 (L) 22 - 32 mmol/L   Glucose, Bld 120 (H) 70 - 99 mg/dL   BUN 30 (H) 8 - 23 mg/dL   Creatinine, Ser 8.42 (H) 0.44 - 1.00 mg/dL   Calcium  8.3 (L) 8.9 - 10.3 mg/dL   Total Protein 6.5 6.5 - 8.1 g/dL   Albumin  3.5 3.5 - 5.0 g/dL   AST 18 15 - 41 U/L   ALT  16 0 - 44 U/L   Alkaline Phosphatase 90 38 - 126 U/L   Total Bilirubin 0.2 0.0 - 1.2 mg/dL   GFR, Estimated 35 (L) >60 mL/min   Anion gap 10 5 - 15  Synovial cell count + diff, w/ crystals     Status: Abnormal   Collection Time: 08/06/24 12:17 PM  Result Value Ref Range   Color, Synovial AMBER (A) YELLOW   Appearance-Synovial TURBID (A) CLEAR   Crystals, Fluid NO CRYSTALS SEEN    WBC, Synovial 530 (H) 0 - 200 /cu mm   Neutrophil, Synovial 84 (H) 0 - 25 %   Lymphocytes-Synovial Fld 3 0 - 20 %   Monocyte-Macrophage-Synovial Fluid 13 (L) 50 - 90 %   Eosinophils-Synovial 0 0 - 1 %  Aerobic/Anaerobic Culture w Gram Stain (surgical/deep wound)     Status: None (Preliminary result)   Collection Time: 08/06/24 12:17 PM   Specimen: Path fluid  Result Value Ref Range   Specimen Description FLUID    Special Requests      SYNOVIAL,RIGHT KNEE Performed at [facility], [address], [place], [place] [address]    Gram Stain PENDING    Culture PENDING    Report Status PENDING   Glucose, capillary     Status: Abnormal   Collection Time: 08/06/24 12:41 PM  Result Value Ref Range   Glucose-Capillary 170 (H) 70 - 99 mg/dL  Glucose, capillary     Status: None   Collection Time: 08/06/24  4:31 PM  Result Value Ref Range   Glucose-Capillary 90 70 - 99 mg/dL  Glucose, capillary     Status: Abnormal   Collection Time: 08/07/24  3:31 AM  Result Value Ref Range   Glucose-Capillary 145 (H) 70 - 99 mg/dL  Glucose, capillary     Status: Abnormal    Collection Time: 08/07/24  7:30 AM  Result Value Ref Range   Glucose-Capillary 148 (H) 70 - 99 mg/dL   Comment 1 Document in Chart      PHYSICAL EXAM:  Gen: Sitting up in bed, no acute distress, pleasant Ext:       Right lower extremity  No significant reaccumulation of joint effusion  Lidocaine  patch is in place  Tenderness does appear to be a bit better and tolerates us  some motion today.   Assessment/Plan:     Principal Problem:   AKI (acute kidney injury)   Anti-infectives (From admission, onward)    None     .  74 year old female status post fall with right knee pain and effusion, end-stage right knee DJD, history of left total knee arthroplasty for OA   -Fall   -Right knee pain with effusion, no fracture, end-stage DJD s/p right knee arthrocentesis with steroid injection               Synovial fluid is not consistent with a septic joint  No crystals noted on analysis, micro is pending  Continue with symptomatic management  Weight-bear as tolerated right leg   Discussed that it could take for up to 3 days for steroid to kick in.  Also it is possible that she sustained a bone contusion from her fall as part of her constellation of injuries.  This would not be readily identifiable on CT scan.   Continue with lidocaine  patches as needed   Certainly does appear to have end-stage DJD to her right knee at baseline and it sounds like she was not moving all that well  due to pain related to this prior to her fall.  Recommend that she follow-up with Dr. Harden upon discharge for further discussion regarding next steps for management   - Pain management:               Multimodal               Tylenol                Intra-articular steroid should help address her intra-articular knee pain.  There can be additional knee pain related to bone contusion related to her fall               Okay to use lidocaine  patches as well               NSAIDs contraindicated given her acute  kidney injury   - ABL anemia/Hemodynamics               Her anemia likely related to her previous hospitalization which required CRRT   - Medical issues                Per primary                 Patient may get a slight bump in her blood sugars due to her intra-articular steroid injection   - Dispo:               Follow-up with Dr. Harden discharge, nonurgent    Francis MICAEL Mt, PA-C [phone] (C) 08/07/2024, 9:17 AM  [facility] [address] [place] [address] [phone] [phone] (F)    After 5pm and on the weekends please log on to Amion, go to orthopaedics and the look under the [facility] Call for the provider(s) on call. You can also call our office at [phone] and then follow the prompts to be connected to the call team.  Patient ID: Alison Whitehead, female   DOB: 03-26-1951, 74 y.o.   MRN: [id number]  "

## 2024-08-07 NOTE — Progress Notes (Signed)
 There was a consult for placing a PIV access. Patient c/o pain when stick with needle and moved her arm. Needle came out. Patient refused to put in the new PIV access, she stated that keep the Rt. AC PIV access. Informed patient's nurse this matter. HS [employer]

## 2024-08-08 DIAGNOSIS — N179 Acute kidney failure, unspecified: Secondary | ICD-10-CM | POA: Diagnosis not present

## 2024-08-08 LAB — GLUCOSE, CAPILLARY
Glucose-Capillary: 173 mg/dL — ABNORMAL HIGH (ref 70–99)
Glucose-Capillary: 144 mg/dL — ABNORMAL HIGH (ref 70–99)
Glucose-Capillary: 189 mg/dL — ABNORMAL HIGH (ref 70–99)
Glucose-Capillary: 165 mg/dL — ABNORMAL HIGH (ref 70–99)

## 2024-08-08 MED ORDER — IRBESARTAN 300 MG PO TABS
300.0000 mg | ORAL_TABLET | Freq: Every day | ORAL | Status: DC
  Administered 2024-08-08 – 2024-08-09 (×2): 300 mg via ORAL
  Filled 2024-08-08 (×2): qty 1

## 2024-08-08 MED ORDER — HYDROCODONE-ACETAMINOPHEN 10-325 MG PO TABS: 1.0000 | ORAL_TABLET | Freq: Four times a day (QID) | ORAL | 0 refills | Status: DC | PRN

## 2024-08-08 MED ORDER — AMLODIPINE-OLMESARTAN 10-40 MG PO TABS: 1.0000 | ORAL_TABLET | Freq: Every day | ORAL | Status: DC

## 2024-08-08 MED ORDER — CARVEDILOL 6.25 MG PO TABS
3.1250 mg | ORAL_TABLET | Freq: Two times a day (BID) | ORAL | Status: DC
  Administered 2024-08-08 – 2024-08-09 (×3): 3.125 mg via ORAL
  Filled 2024-08-08 (×3): qty 1

## 2024-08-08 MED ORDER — AMLODIPINE BESYLATE 10 MG PO TABS
10.0000 mg | ORAL_TABLET | Freq: Every day | ORAL | Status: DC
  Administered 2024-08-08 – 2024-08-09 (×2): 10 mg via ORAL
  Filled 2024-08-08 (×2): qty 1

## 2024-08-08 MED ORDER — FUROSEMIDE 40 MG PO TABS
20.0000 mg | ORAL_TABLET | Freq: Every morning | ORAL | Status: DC
  Administered 2024-08-08 – 2024-08-09 (×2): 20 mg via ORAL
  Filled 2024-08-08 (×2): qty 1

## 2024-08-08 NOTE — Care Management Important Message (Signed)
 Important Message  Patient Details  Name: Alison Whitehead MRN: [id number] Date of Birth: [birth year]   Important Message Given:  Yes - Medicare IM     Claretta Deed 08/08/2024, 2:49 PM

## 2024-08-08 NOTE — TOC Progression Note (Signed)
 Transition of Care [facility]) - Progression Note    Patient Details  Name: Alison Whitehead MRN: [id number] Date of Birth: 12-11-1950  [facility] CM/SW Contact  Luann SHAUNNA [place], [place] Phone Number: 08/08/2024, 9:42 AM  Clinical Narrative:     SNF auth still pending at this time. TOC will continue to follow.   Expected Discharge Plan: Skilled Nursing Facility Barriers to Discharge:Insurance Authorization               Expected Discharge Plan and Services In-house Referral: Clinical Social Work     Living arrangements for the past 2 months: Apartment, Skilled Nursing Facility Expected Discharge Date: 08/08/24                                     Social Drivers of Health (SDOH) Interventions SDOH Screenings   Food Insecurity: No Food Insecurity (08/04/2024)  Housing: Low Risk (08/04/2024)  Transportation Needs: No Transportation Needs (08/04/2024)  Utilities: Not At Risk (08/04/2024)  Depression (PHQ2-9): Low Risk (07/02/2022)  Social Connections: Unknown (08/04/2024)  Recent Concern: Social Connections - Moderately Isolated (07/18/2024)  Tobacco Use: Medium Risk (08/04/2024)    Readmission Risk Interventions    12/28/2022    1:24 PM  Readmission Risk Prevention Plan  Post Dischage Appt Complete  Medication Screening Not Complete  Medication Screening Not Complete Comment N/A  Transportation Screening Not Complete  Transportation Screening Comment N/A

## 2024-08-08 NOTE — Progress Notes (Signed)
 Pt refusing IV at this time. Pt educated about possible use of IV should an urgent or emergent situation arise. Pt continues to refuse. Pt refusing insertion of new PIV at this time. States - 'If the Doctor says I need IV meds then that's fine but for right now since I don't have any IV meds, no. Take this out and no new IV'.

## 2024-08-08 NOTE — Progress Notes (Signed)
 " PROGRESS NOTE    Alison Whitehead  FMW:[id number] DOB: 11/14/1950 DOA: 08/04/2024 PCP: [name], PA-C   Brief Narrative:  74 y.o. F with obesity, HTN and CKD IIIb admitted 1 month ago for AKI requiring CRRT temporarily who returned with fall.  Found to have AKI.   In the hospital for 2 weeks last month, for AKI, required CRRT, Cr resolved to baseline 1.3-1.5, discharged to SNF.  Evidently discharged from rehab after less than a week, was home only a couple days then fell.   In the ER, reported dizziness, appeared dehydrated.  Cr back up to 3.3.  Also right knee swollen.  Admitted for AKI.  Assessment & Plan:   Principal Problem:   AKI (acute kidney injury)  AKI on CKD IIIb Baseline Cr 1.3-1.5 at the time of discharge 12/23 Cr on return back up to 3.1.  Appeared dehdyrated on exam, improved to 1.26 today.  Continue to hold aspirin , furosemide , olmesartan  metformin - Avoid hypotension and nephrotoxic agents.   Abnormal blood culture Strep spp in 1/2.  Possible contaminant.  Procal only 0.15, no fever or white count or constitutional symptoms - Hold Abx - Follow culture to speciation   Knee effusion Right knee osteoarthritis Dr. Jonel discussed with Ortho/Michael Reyes.  Per his recommendation, CT right knee was obtained and CT shows large effusion, severe arthritis, no fracture.  Unsure why arthrocentesis was not considered.  She had very large knee effusion, I consulted orthopedics, she underwent arthrocentesis with removal of bloody 50 cc fluid.  Also Kenalog  was injected.  Fluid analysis negative for crystals and infection.  Right knee feels much better.  No more tenderness and she is able to flex.   Anemia of CKD Marked anemia, stable from last admission.  No bleeding reported or observed.   Hypertension  amlodipine , Coreg , Lasix  and olmesartan  on hold and blood pressure within normal range.   Diabetes melitis type II - Hold metformin , Farxiga  - Continue  SS corrections   Class 2 obesity - Hold Ozempic , resume at discharge.   Pancreatic insufficiency - Continue Creon   DVT prophylaxis: SCDs Start: 08/04/24 1509   Code Status: Full Code  Family Communication:  None present at bedside.  Plan of care discussed with patient in length and he/she verbalized understanding and agreed with it.  Status is: Inpatient Remains inpatient appropriate because: Medically stable, pending insurance authorization for SNF.   Estimated body mass index is 37.08 kg/m as calculated from the following:   Height as of this encounter: 5' 7 (1.702 m).   Weight as of this encounter: 107.4 kg.    Nutritional Assessment: Body mass index is 37.08 kg/m.[name] Seen by dietician.  I agree with the assessment and plan as outlined below: Nutrition Status:        . Skin Assessment: I have examined the patient's skin and I agree with the wound assessment as performed by the wound care RN as outlined below:    Consultants:  Orthopedics  Procedures:  As above  Antimicrobials:  Anti-infectives (From admission, onward)    None         Subjective: Patient seen and examined.  She is very happy that she is feeling better and she is able to flex her right knee.  She denies any pain and she has no tenderness as well.  Objective: Vitals:   08/07/24 1552 08/07/24 2124 08/08/24 0524 08/08/24 0914  BP: (!) 144/88 124/73 (!) 157/98 (!) 163/96  Pulse: 79 94 73 77  Resp:  18 18 16   Temp: 97.9 F (36.6 C) 97.9 F (36.6 C) 97.9 F (36.6 C) 98.3 F (36.8 C)  TempSrc:   Oral Oral  SpO2: 94% 92% 96% 97%  Weight:      Height:        Intake/Output Summary (Last 24 hours) at 08/08/2024 1530 Last data filed at 08/08/2024 0525 Gross per 24 hour  Intake 1048.24 ml  Output 500 ml  Net 548.24 ml   Filed Weights   08/04/24 1718  Weight: 107.4 kg    Examination:  General exam: Appears calm and comfortable  Respiratory system: Clear to auscultation. Respiratory  effort normal. Cardiovascular system: S1 & S2 heard, RRR. No JVD, murmurs, rubs, gallops or clicks. No pedal edema. Gastrointestinal system: Abdomen is nondistended, soft and nontender. No organomegaly or masses felt. Normal bowel sounds heard. Central nervous system: Alert and oriented. No focal neurological deficits. Extremities: Symmetric 5 x 5 power.  Still with mild right knee effusion. Skin: No rashes, lesions or ulcers.  Psychiatry: Judgement and insight appear normal. Mood & affect appropriate.   Data Reviewed: I have personally reviewed following labs and imaging studies  CBC: Recent Labs  Lab 08/04/24 1322 08/05/24 0452 08/06/24 1029 08/07/24 1314  WBC 9.8 6.7 6.7 7.9  NEUTROABS  --   --   --  6.4  HGB 8.3* 7.7* 7.8* 8.3*  HCT 29.3* 26.3* 26.7* 27.9*  MCV 91.8 90.7 89.9 89.1  PLT 291 258 238 287   Basic Metabolic Panel: Recent Labs  Lab 08/04/24 1322 08/05/24 0452 08/06/24 1029 08/07/24 1314  NA 136 139 138 136  K 5.3* 4.7 4.9 5.1  CL 104 107 109 108  CO2 21* 22 19* 18*  GLUCOSE 134* 108* 120* 185*  BUN 49* 40* 30* 24*  CREATININE 3.14* 2.28* 1.57* 1.26*  CALCIUM  8.3* 8.3* 8.3* 8.6*   GFR: Estimated Creatinine Clearance: 50.9 mL/min (A) (by C-G formula based on SCr of 1.26 mg/dL (H)). Liver Function Tests: Recent Labs  Lab 08/06/24 1029  AST 18  ALT 16  ALKPHOS 90  BILITOT 0.2  PROT 6.5  ALBUMIN  3.5   No results for input(s): LIPASE, AMYLASE in the last 168 hours. No results for input(s): AMMONIA in the last 168 hours. Coagulation Profile: No results for input(s): INR, PROTIME in the last 168 hours. Cardiac Enzymes: Recent Labs  Lab 08/04/24 1536  CKTOTAL 148   BNP (last 3 results) No results for input(s): PROBNP in the last 8760 hours. HbA1C: No results for input(s): HGBA1C in the last 72 hours. CBG: Recent Labs  Lab 08/07/24 1131 08/07/24 1552 08/07/24 2134 08/08/24 0841 08/08/24 1133  GLUCAP 118* 154* 219* 173* 144*    Lipid Profile: No results for input(s): CHOL, HDL, LDLCALC, TRIG, CHOLHDL, LDLDIRECT in the last 72 hours. Thyroid Function Tests: No results for input(s): TSH, T4TOTAL, FREET4, T3FREE, THYROIDAB in the last 72 hours. Anemia Panel: No results for input(s): VITAMINB12, FOLATE, FERRITIN, TIBC, IRON, RETICCTPCT in the last 72 hours. Sepsis Labs: Recent Labs  Lab 08/04/24 1536  PROCALCITON 0.15    Recent Results (from the past 240 hours)  Culture, blood (Routine X 2) w Reflex to ID Panel     Status: Abnormal   Collection Time: 08/04/24  3:30 PM   Specimen: BLOOD  Result Value Ref Range Status   Specimen Description BLOOD RIGHT ANTECUBITAL  Final   Special Requests   Final    BOTTLES DRAWN AEROBIC AND ANAEROBIC Blood Culture results  may not be optimal due to an inadequate volume of blood received in culture bottles   Culture  Setup Time   Final    GRAM POSITIVE COCCI AEROBIC BOTTLE ONLY CRITICAL RESULT CALLED TO, READ BACK BY AND VERIFIED WITH: PHARMD JEREMY F 1435 [id number] FCP    Culture (A)  Final    STREPTOCOCCUS PARASANGUINIS CORRECTED RESULTS PREVIOUSLY REPORTED AS: STREPTOCOCCUS SALIVARIUS Results Called to: MAYA RANKIN SAMS [id number] AT 1104 BY EC Performed at [facility], [address], [place], [place] [address]    Report Status 08/07/2024 FINAL  Final   Organism ID, Bacteria STREPTOCOCCUS PARASANGUINIS  Final      Susceptibility   Streptococcus parasanguinis - MIC*    PENICILLIN 0.25 INTERMEDIATE Intermediate     CEFTRIAXONE <=0.12 SENSITIVE Sensitive     ERYTHROMYCIN 1 RESISTANT Resistant     LEVOFLOXACIN 1 SENSITIVE Sensitive     VANCOMYCIN  0.25 SENSITIVE Sensitive     * STREPTOCOCCUS PARASANGUINIS  Blood Culture ID Panel (Reflexed)     Status: Abnormal   Collection Time: 08/04/24  3:30 PM  Result Value Ref Range Status   Enterococcus faecalis NOT DETECTED NOT DETECTED Final   Enterococcus Faecium NOT DETECTED NOT  DETECTED Final   Listeria monocytogenes NOT DETECTED NOT DETECTED Final   Staphylococcus species NOT DETECTED NOT DETECTED Final   Staphylococcus aureus (BCID) NOT DETECTED NOT DETECTED Final   Staphylococcus epidermidis NOT DETECTED NOT DETECTED Final   Staphylococcus lugdunensis NOT DETECTED NOT DETECTED Final   Streptococcus species DETECTED (A) NOT DETECTED Final    Comment: Not Enterococcus species, Streptococcus agalactiae, Streptococcus pyogenes, or Streptococcus pneumoniae. CRITICAL RESULT CALLED TO, READ BACK BY AND VERIFIED WITH: PHARMD JEREMY F 1435 [id number] FCP    Streptococcus agalactiae NOT DETECTED NOT DETECTED Final   Streptococcus pneumoniae NOT DETECTED NOT DETECTED Final   Streptococcus pyogenes NOT DETECTED NOT DETECTED Final   A.calcoaceticus-baumannii NOT DETECTED NOT DETECTED Final   Bacteroides fragilis NOT DETECTED NOT DETECTED Final   Enterobacterales NOT DETECTED NOT DETECTED Final   Enterobacter cloacae complex NOT DETECTED NOT DETECTED Final   Escherichia coli NOT DETECTED NOT DETECTED Final   Klebsiella aerogenes NOT DETECTED NOT DETECTED Final   Klebsiella oxytoca NOT DETECTED NOT DETECTED Final   Klebsiella pneumoniae NOT DETECTED NOT DETECTED Final   Proteus species NOT DETECTED NOT DETECTED Final   Salmonella species NOT DETECTED NOT DETECTED Final   Serratia marcescens NOT DETECTED NOT DETECTED Final   Haemophilus influenzae NOT DETECTED NOT DETECTED Final   Neisseria meningitidis NOT DETECTED NOT DETECTED Final   Pseudomonas aeruginosa NOT DETECTED NOT DETECTED Final   Stenotrophomonas maltophilia NOT DETECTED NOT DETECTED Final   Candida albicans NOT DETECTED NOT DETECTED Final   Candida auris NOT DETECTED NOT DETECTED Final   Candida glabrata NOT DETECTED NOT DETECTED Final   Candida krusei NOT DETECTED NOT DETECTED Final   Candida parapsilosis NOT DETECTED NOT DETECTED Final   Candida tropicalis NOT DETECTED NOT DETECTED Final   Cryptococcus  neoformans/gattii NOT DETECTED NOT DETECTED Final    Comment: Performed at [facility], [address] [place], [place] [address]  Culture, blood (Routine X 2) w Reflex to ID Panel     Status: None (Preliminary result)   Collection Time: 08/04/24  7:20 PM   Specimen: BLOOD RIGHT HAND  Result Value Ref Range Status   Specimen Description BLOOD RIGHT HAND  Final   Special Requests   Final    BOTTLES  DRAWN AEROBIC AND ANAEROBIC Blood Culture adequate volume   Culture   Final    NO GROWTH 4 DAYS Performed at [facility], [address], [place], [place] [address]    Report Status PENDING  Incomplete  Aerobic/Anaerobic Culture w Gram Stain (surgical/deep wound)     Status: None (Preliminary result)   Collection Time: 08/06/24 12:17 PM   Specimen: Path fluid  Result Value Ref Range Status   Specimen Description FLUID  Final   Special Requests SYNOVIAL,RIGHT KNEE  Final   Gram Stain   Final    RARE WBC PRESENT, PREDOMINANTLY PMN NO ORGANISMS SEEN    Culture   Final    NO GROWTH 2 DAYS NO ANAEROBES ISOLATED; CULTURE IN PROGRESS FOR 5 DAYS Performed at [facility], [address], [place], [place] [address]    Report Status PENDING  Incomplete     Radiology Studies: DG Foot Complete Right Result Date: 08/06/2024 EXAM: 3 OR MORE VIEW(S) XRAY OF THE RIGHT FOOT 08/06/2024 04:14:00 PM COMPARISON: None available. CLINICAL HISTORY: Hallux valgus (acquired), right foot. FINDINGS: BONES AND JOINTS: Hallux valgus (acquired), right foot. Degenerative changes in the first metatarsophalangeal joint. Degenerative changes in the intertarsal joints. No acute fracture. No dislocation. No focal bone erosion or bone destruction. SOFT TISSUES: Mild overlying soft tissue swelling. No radiopaque soft tissue foreign bodies or soft tissue gas. IMPRESSION: 1. Degenerative changes in the first metatarsophalangeal and intertarsal joints. 2. No acute fracture or dislocation. 3. Mild overlying soft  tissue swelling without radiopaque soft tissue foreign body or soft tissue gas. Electronically signed by: Elsie Gravely MD 08/06/2024 08:19 PM EST RP Workstation: [id number]    Scheduled Meds:  acetaminophen   1,000 mg Oral TID   amLODipine   10 mg Oral Daily   atorvastatin   40 mg Oral Daily   carvedilol   3.125 mg Oral BID WC   feeding supplement (GLUCERNA SHAKE)  237 mL Oral TID BM   furosemide   20 mg Oral q morning   insulin  aspart  0-15 Units Subcutaneous TID WC   irbesartan   300 mg Oral Daily   lidocaine   1 patch Transdermal Q24H   lipase/protease/amylase  36,000 Units Oral TID AC   melatonin  5 mg Oral QHS   methocarbamol   500 mg Oral TID   multivitamin  1 tablet Oral QHS   pantoprazole   40 mg Oral Daily   polyethylene glycol  17 g Oral Daily   senna-docusate  1 tablet Oral BID   Continuous Infusions:     LOS: 4 days   Fredia Skeeter, MD [facility]  08/08/2024, 3:30 PM   *Please note that this is a verbal dictation therefore any spelling or grammatical errors are due to the Dragon Medical One system interpretation.  Please page via Amion and do not message via secure chat for urgent patient care matters. Secure chat can be used for non urgent patient care matters.  How to contact the [facility] Attending or Consulting provider 7A - 7P or covering provider during after hours 7P -7A, for this patient?  Check the care team in [facility] and look for a) attending/consulting [facility] provider listed and b) the [facility] team listed. Page or secure chat 7A-7P. Log into [email or web address] and use [facility] universal password to access. If you do not have the password, please contact the hospital operator. Locate the [facility] provider you are looking for under [facility] and page to a number that you can be directly reached. If  you still have difficulty reaching the provider, please page the [facility] (Director on Call) for the Hospitalists listed on amion for assistance.  "

## 2024-08-09 DIAGNOSIS — N179 Acute kidney failure, unspecified: Secondary | ICD-10-CM | POA: Diagnosis not present

## 2024-08-09 LAB — CULTURE, BLOOD (ROUTINE X 2)
Culture: NO GROWTH
Special Requests: ADEQUATE

## 2024-08-09 LAB — GLUCOSE, CAPILLARY
Glucose-Capillary: 160 mg/dL — ABNORMAL HIGH (ref 70–99)
Glucose-Capillary: 162 mg/dL — ABNORMAL HIGH (ref 70–99)

## 2024-08-09 MED ORDER — ONDANSETRON 4 MG PO TBDP
4.0000 mg | ORAL_TABLET | Freq: Three times a day (TID) | ORAL | Status: DC | PRN
  Administered 2024-08-09: 4 mg via ORAL
  Filled 2024-08-09: qty 1

## 2024-08-09 NOTE — Progress Notes (Addendum)
 Physical Therapy Treatment Patient Details Name: Alison Whitehead MRN: [id number] DOB: 03-14-1951 Today's Date: 08/09/2024   History of Present Illness 73 y.o. female presents to [facility] hospital on 08/05/2023 after a fall with R knee pain. Workup also notable for recurrent AKI. Pt underwent aspiration of 50ml fluid from R knee 1/3. PMH includes CKD III, HTN, HLD, prediabetes.    PT Comments  Pt admitted with above diagnosis. Pt continues to be limited in movement however was able to get OOB to 3N1 and then back to bed today with min assist and cues. Exercises right LE as well.  Plan is for post acute rehab < 3 hours day.  Pt currently with functional limitations due to the deficits listed below (see PT Problem List). Pt will benefit from acute skilled PT to increase their independence and safety with mobility to allow discharge.       If plan is discharge home, recommend the following: A lot of help with walking and/or transfers;A lot of help with bathing/dressing/bathroom;Assistance with cooking/housework;Assist for transportation;Help with stairs or ramp for entrance   Can travel by private vehicle        Equipment Recommendations  Rolling walker (2 wheels);BSC/3in1    Recommendations for Other Services OT consult;Rehab consult;Other (comment)     Precautions / Restrictions Precautions Precautions: Fall Recall of Precautions/Restrictions: Intact Precaution/Restrictions Comments: continued cognitive deficits secondary to AMS and AKI but overall improving Restrictions Weight Bearing Restrictions Per Provider Order: Yes RLE Weight Bearing Per Provider Order: Weight bearing as tolerated     Mobility  Bed Mobility Overal bed mobility: Needs Assistance Bed Mobility: Supine to Sit, Sit to Supine     Supine to sit: Min assist Sit to supine: Supervision   General bed mobility comments: Assist needed to move right LE to get to EOB and a little assist to scoot out to EOB. Pt didnt need  assist to get back into bed.    Transfers Overall transfer level: Needs assistance Equipment used: Rolling walker (2 wheels) Transfers: Sit to/from Stand, Bed to chair/wheelchair/BSC Sit to Stand: Min assist Stand pivot transfers: Min assist        Lateral/Scoot Transfers: Min assist General transfer comment: Pt needed to use bathroom once sitting, therefore obtained 3N1 and pt scooted to drop arm commode with min assist. Used bathroom. Stood to [employer] with min assist and PT cleaned pt and then pt stood and pivoted with RW to get back to bed with min assist and min cues.  Pt reports she was too fatigued to walk.    Ambulation/Gait                   Stairs             Wheelchair Mobility     Tilt Bed    Modified Rankin (Stroke Patients Only)       Balance Overall balance assessment: Needs assistance Sitting-balance support: No upper extremity supported, Feet supported Sitting balance-Leahy Scale: Fair     Standing balance support: Bilateral upper extremity supported, During functional activity Standing balance-Leahy Scale: Poor Standing balance comment: relies on UE support                            Communication Communication Communication: No apparent difficulties Factors Affecting Communication: Hearing impaired  Cognition Arousal: Alert Behavior During Therapy: WFL for tasks assessed/performed, Restless   PT - Cognitive impairments: No apparent impairments  Following commands: Intact      Cueing Cueing Techniques: Verbal cues  Exercises General Exercises - Lower Extremity Ankle Circles/Pumps: AROM, Both, 5 reps, Supine Quad Sets: AROM, Both, 5 reps, Supine Heel Slides: Right, 5 reps, Supine, AAROM    General Comments General comments (skin integrity, edema, etc.): VSS      Pertinent Vitals/Pain Pain Assessment Pain Assessment: Faces Faces Pain Scale: Hurts even more Breathing:  normal Negative Vocalization: none Facial Expression: smiling or inexpressive Body Language: relaxed Consolability: no need to console PAINAD Score: 0 Pain Location: R knee Pain Descriptors / Indicators: Aching, Grimacing, Guarding, Moaning, Sore, Tightness, Sharp Pain Intervention(s): Limited activity within patient's tolerance, Monitored during session, Premedicated before session, Repositioned    Home Living                          Prior Function            PT Goals (current goals can now be found in the care plan section) Progress towards PT goals: Progressing toward goals    Frequency    Min 2X/week      PT Plan      Co-evaluation              AM-PAC PT 6 Clicks Mobility   Outcome Measure  Help needed turning from your back to your side while in a flat bed without using bedrails?: A Little Help needed moving from lying on your back to sitting on the side of a flat bed without using bedrails?: A Lot Help needed moving to and from a bed to a chair (including a wheelchair)?: A Lot Help needed standing up from a chair using your arms (e.g., wheelchair or bedside chair)?: A Little Help needed to walk in hospital room?: Total Help needed climbing 3-5 steps with a railing? : Total 6 Click Score: 12    End of Session Equipment Utilized During Treatment: Gait belt Activity Tolerance: Patient limited by pain Patient left: in bed;with call bell/phone within reach;with bed alarm set Nurse Communication: Mobility status PT Visit Diagnosis: Muscle weakness (generalized) (M62.81);Pain     Time: 8966-8943 PT Time Calculation (min) (ACUTE ONLY): 23 min  Charges:    $Therapeutic Exercise: 8-22 mins $Therapeutic Activity: 8-22 mins PT General Charges $$ ACUTE PT VISIT: 1 Visit                     Nivan Melendrez M,PT Acute Rehab Services [phone]    Stephane JULIANNA Bevel 08/09/2024, 12:20 PM

## 2024-08-09 NOTE — Plan of Care (Signed)

## 2024-08-09 NOTE — Discharge Summary (Signed)
 Physician Discharge Summary  Alison Whitehead FMW:[id number] DOB: 02-04-1951 DOA: 08/04/2024  PCP: [name], PA-C  Admit date: 08/04/2024 Discharge date: 08/09/2024 30 Day Unplanned Readmission Risk Score    Flowsheet Row ED to Hosp-Admission (Current) from 08/04/2024 in [facility] GENERAL MED/SURG UNIT  30 Day Unplanned Readmission Risk Score (%) 25.78 Filed at 08/09/2024 0801    This score is the patient's risk of an unplanned readmission within 30 days of being discharged (0 -100%). The score is based on dignosis, age, lab data, medications, orders, and past utilization.   Low:  0-14.9   Medium: 15-21.9   High: 22-29.9   Extreme: 30 and above          Admitted From: Home Disposition: SNF  Recommendations for Outpatient Follow-up:  Follow up with PCP in 1-2 weeks Please obtain BMP/CBC in one week Please follow up with your PCP on the following pending results: Unresulted Labs (From admission, onward)    None         Home Health: None Equipment/Devices: None  Discharge Condition: Stable CODE STATUS: Full code Diet recommendation:  Diet Order             Diet Carb Modified Room service appropriate? Yes  Diet effective now                   Subjective: Seen and examined.  Feels well.  No complaints.  Fully aware of going to SNF today.  Brief/Interim Summary: 74 y.o. F with obesity, HTN and CKD IIIb admitted 1 month ago for AKI requiring CRRT temporarily who returned with fall.  Found to have AKI.   In the hospital for 2 weeks last month, for AKI, required CRRT, Cr resolved to baseline 1.3-1.5, discharged to SNF.  Evidently discharged from rehab after less than a week, was home only a couple days then fell.   In the ER, reported dizziness, appeared dehydrated.  Cr back up to 3.3.  Also right knee swollen.  Admitted for AKI.  Details below.   AKI on CKD IIIb Baseline Cr 1.3-1.5 at the time of discharge 12/23 Cr on return back up to 3.1.   Appeared dehdyrated on exam, improved to 1.26 today.  Continue to hold aspirin , furosemide , olmesartan  metformin - Avoid hypotension and nephrotoxic agents.   Abnormal blood culture Strep spp in 1/2.  Possible contaminant.  Procal only 0.15, no fever or white count or constitutional symptoms - Hold Abx - Follow culture to speciation   Knee effusion Right knee osteoarthritis Dr. Jonel discussed with Ortho/Michael Reyes.  Per his recommendation, CT right knee was obtained and CT shows large effusion, severe arthritis, no fracture.  Unsure why arthrocentesis was not considered.  She had very large knee effusion, I consulted orthopedics, she underwent arthrocentesis with removal of bloody 50 cc fluid.  Also Kenalog  was injected.  Fluid analysis negative for crystals and infection.  Right knee feels much better.  No more tenderness and she is able to flex.   Anemia of CKD Marked anemia, stable from last admission.  No bleeding reported or observed.   Hypertension Resuming amlodipine , Coreg , Lasix  and olmesartan .   Diabetes melitis type II Resume PTA medications.  Hemoglobin A1c only 5.8.   Class 2 obesity - Resume Ozempic  at discharge.   Pancreatic insufficiency - Continue Creon   Discharge Diagnoses:  Principal Problem:   AKI (acute kidney injury)    Discharge Instructions   Allergies as of 08/09/2024  Reactions   Penicillins Itching        Medication List     TAKE these medications    amLODipine -olmesartan  10-40 MG tablet Commonly known as: AZOR  Take 1 tablet by mouth daily.   aspirin  EC 81 MG tablet Take 1 tablet (81 mg total) by mouth daily.   atorvastatin  40 MG tablet Commonly known as: LIPITOR Take 1 tablet (40 mg total) by mouth daily.   carvedilol  6.25 MG tablet Commonly known as: COREG  Take 1 tablet (6.25 mg total) by mouth 2 (two) times daily.   Creon  36000-114000 units Cpep capsule Generic drug: lipase/protease/amylase Take 36,000 Units by  mouth in the morning and at bedtime.   cyclobenzaprine  5 MG tablet Commonly known as: FLEXERIL  TAKE 1 TABLET BY MOUTH THREE TIMES A DAY AS NEEDED FOR MUSCLE SPASMS   dapagliflozin  propanediol 10 MG Tabs tablet Commonly known as: FARXIGA  Take 1 tablet (10 mg total) by mouth daily.   dicyclomine  10 MG capsule Commonly known as: BENTYL  Take 1-2 capsules (10-20 mg total) by mouth 4 (four) times daily as needed for spasms.   ferrous sulfate  325 (65 FE) MG tablet Take 325 mg by mouth daily with breakfast.   furosemide  20 MG tablet Commonly known as: LASIX  Take 20 mg by mouth every morning.   HYDROcodone -acetaminophen  10-325 MG tablet Commonly known as: NORCO Take 1 tablet by mouth every 6 (six) hours as needed for severe pain (pain score 7-10).   Melatonin 5 MG Caps Take 5 mg by mouth at bedtime.   metFORMIN  1000 MG tablet Commonly known as: GLUCOPHAGE  Take 1,000 mg by mouth every evening.   naloxone 4 MG/0.1ML Liqd nasal spray kit Commonly known as: NARCAN   Ozempic  (2 MG/DOSE) 8 MG/3ML Sopn Generic drug: Semaglutide  (2 MG/DOSE) Inject 2 mg into the skin once a week.        Follow-up Information     [name], PA-C Follow up in 1 week(s).   Specialty: Physician Assistant Contact information: [address] [place], [place] [address] [phone]                Allergies[1]  Consultations: Orthopedics   Procedures/Studies: DG Foot Complete Right Result Date: 08/06/2024 EXAM: 3 OR MORE VIEW(S) XRAY OF THE RIGHT FOOT 08/06/2024 04:14:00 PM COMPARISON: None available. CLINICAL HISTORY: Hallux valgus (acquired), right foot. FINDINGS: BONES AND JOINTS: Hallux valgus (acquired), right foot. Degenerative changes in the first metatarsophalangeal joint. Degenerative changes in the intertarsal joints. No acute fracture. No dislocation. No focal bone erosion or bone destruction. SOFT TISSUES: Mild overlying soft tissue swelling. No radiopaque soft tissue  foreign bodies or soft tissue gas. IMPRESSION: 1. Degenerative changes in the first metatarsophalangeal and intertarsal joints. 2. No acute fracture or dislocation. 3. Mild overlying soft tissue swelling without radiopaque soft tissue foreign body or soft tissue gas. Electronically signed by: Elsie Gravely MD 08/06/2024 08:19 PM EST RP Workstation: [id number]   CT KNEE RIGHT WO CONTRAST Result Date: 08/05/2024 CLINICAL DATA:  Knee pain.  Fall. EXAM: CT OF THE RIGHT KNEE WITHOUT CONTRAST TECHNIQUE: Multidetector CT imaging of the right knee was performed according to the standard protocol. Multiplanar CT image reconstructions were also generated. RADIATION DOSE REDUCTION: This exam was performed according to the departmental dose-optimization program which includes automated exposure control, adjustment of the mA and/or kV according to patient size and/or use of iterative reconstruction technique. COMPARISON:  Right knee radiographs dated 08/04/2024. FINDINGS: Bones/Joint/Cartilage No acute fracture or dislocation. Redemonstrated severe tricompartmental osteoarthritis  of the knee most pronounced in the medial femorotibial compartment with severe joint space narrowing resulting in near bone-on-bone contact, subchondral sclerosis, subchondral cystic changes, and marginal osteophytosis. Bulky fragmented superior patellar pole osteophytes are again noted. Similar mild lateral subluxation of the tibia relative to the femur. Large knee joint effusion. Ligaments Ligaments are suboptimally evaluated by CT. Muscles and Tendons No appreciable acute muscular abnormality. The patellar tendon is intact. Attenuated appearance of the distal quadriceps tendon. Partial-thickness tear can not be excluded. Soft tissue Subcutaneous edema along the anterior and anterolateral knee. No loculated fluid collection. IMPRESSION: 1. No acute osseous abnormality. 2. Severe tricompartmental osteoarthritis of the knee most pronounced in the  medial femorotibial compartment resulting in near bone-on-bone contact. Similar mild lateral subluxation of the tibia relative to the femur. 3. Large knee joint effusion. 4. Attenuated appearance of the distal quadriceps tendon. Partial-thickness tear can not be excluded. 5. Subcutaneous edema along the anterior and anterolateral knee. Electronically Signed   By: Harrietta Sherry M.D.   On: 08/05/2024 12:42   DG Knee Complete 4 Views Right Result Date: 08/04/2024 EXAM: 4 OR MORE VIEW(S) XRAY OF THE RIGHT KNEE 08/04/2024 11:32:00 AM COMPARISON: Right knee series dated 05/05/2023. CLINICAL HISTORY: Fall, pain. FINDINGS: BONES AND JOINTS: No acute fracture. There is severe tricompartmental osteoarthritis of the right knee with mild lateral subluxation of the tibial plateau relative to the femoral condyles. There is moderate joint space narrowing, particularly involving the medial tibial femoral compartment. There is sclerosis of the articular surfaces. There is patellofemoral osteophytosis. There is a mild suprapatellar bursal effusion. SOFT TISSUES: There are vascular calcifications. IMPRESSION: 1. 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 2. Mild suprapatellar bursal effusion. Electronically signed by: Evalene Coho MD 08/04/2024 12:31 PM EST RP Workstation: [id number]   DG HIP UNILAT W OR W/O PELVIS 2-3 VIEWS RIGHT Result Date: 08/04/2024 EXAM: 2 or 3 VIEW(S) XRAY OF THE RIGHT HIP 08/04/2024 11:32:00 AM COMPARISON: Bilateral hip series dated 02/29/2004. CLINICAL HISTORY: fall, pain FINDINGS: BONES AND JOINTS: There is a new, ovoid sclerotic density present superior laterally within the right femoral neck measuring approximately 15 x 11 mm. No acute  fracture. No malalignment. There are mild degenerative changes within the sacroiliac joints bilaterally. SOFT TISSUES: There are numerous phleboliths within the pelvis. LUMBAR SPINE: There are mild degenerative changes within the lower lumbar spine. IMPRESSION: 1. New ovoid sclerotic density in the superolateral right femoral neck measuring approximately 15 x 11 mm; indeterminate (bone island favored), consider follow-up imaging to document stability. 2. No acute fracture or dislocation. 3. Mild degenerative changes of the sacroiliac joints bilaterally and lower lumbar spine. Electronically signed by: Evalene Coho MD 08/04/2024 12:30 PM EST RP Workstation: [id number]   CT ABDOMEN PELVIS WO CONTRAST Result Date: 07/13/2024 EXAM: CT ABDOMEN AND PELVIS WITHOUT CONTRAST 07/13/2024 08:14:00 AM TECHNIQUE: CT of the abdomen and pelvis was performed without the administration of intravenous contrast. Multiplanar reformatted images are provided for review. Automated exposure control, iterative reconstruction, and/or weight-based adjustment of the mA/kV was utilized to reduce the radiation dose to as low as reasonably achievable. COMPARISON: 09/30/2019 CLINICAL HISTORY: abd pain, ams FINDINGS: LOWER CHEST: Moderate cardiomegaly. Bilateral lower lobe airspace opacities primarily from atelectasis. LIVER: The liver is unremarkable. GALLBLADDER AND BILE DUCTS: Gallbladder is unremarkable. No biliary ductal dilatation. SPLEEN: No  acute abnormality. PANCREAS: No acute abnormality. ADRENAL GLANDS: No acute abnormality. KIDNEYS, URETERS AND BLADDER: 1.5 x 0.7 x 1.3 cm hyperdense lesion of the left mid kidney posteromedially on image 34 series 3, internal density 101 Hounsfield units, compatible with benign positive category 2 cyst. No further imaging workup of this lesion is indicated. Foley catheter is present in the urinary bladder. No hydronephrosis or hydroureter. Bilateral periureteral vascular calcifications but no  definite intraureteral stone seen. GI AND BOWEL: Stomach demonstrates no acute abnormality. Sigmoid colon diverticulosis and scattered diverticula elsewhere in the colon. Vertically oriented 3.4 cm linear density in the sigmoid colon on image 86 series 7, not present on prior exams, possibly intraluminal animal bone given the configuration. No associated obstruction or extraluminal gas although the orientation of the long axis of this structure is perpendicular to the axis of the sigmoid colon. There is no bowel obstruction. PERITONEUM AND RETROPERITONEUM: No ascites. No free air. VASCULATURE: Systemic atherosclerosis is present, including the aorta and iliac arteries. Aorta is normal in caliber. Right venous femoral line terminates in the right external iliac vein. Left femoral venous line likewise terminates in the common femoral vein. A smaller likely arterial left femoral vein extends distally into the profunda branch of the left femoral artery, with tip not visible. LYMPH NODES: No lymphadenopathy. REPRODUCTIVE ORGANS: Calcified uterine fibroids. BONES AND SOFT TISSUES: Supraumbilical hernia contains adipose tissue. 7 mm degenerative anterolisthesis at L4-L5. Lumbar spondylosis and degenerative disc disease cause moderate left foraminal impingement at L3-L4 and bilateral foraminal impingement at L4-L5. Moderate degenerative hip arthropathy bilaterally. No acute osseous abnormality. No focal soft tissue abnormality. IMPRESSION: 1. Vertically oriented 3.4 cm linear density in the sigmoid colon, possibly an ingested bone, without current obstruction or extraluminal gas. This structure is oriented perpendicular to the axis of the sigmoid colon. Recommend correlation with focal lower pelvis tenderness or GI bleeding ; if concern for retained sharp foreign body, consider surgical or endoscopic evaluation. 2. The left femoral arterial line appears to extend distally into the profunda femoris branch of the left femoral  artery , tip not included. 3. Benign Bosniak II hemorrhagic/proteinaceous cyst in the left kidney; no follow-up imaging recommended. 4. Systemic atherosclerosis involving the aorta and iliac arteries. 5. Moderate cardiomegaly and bilateral lower lobe airspace opacities likely from atelectasis. 6. Degenerative changes including 7 mm degenerative anterolisthesis at L4-5, lumbar spondylosis and degenerative disc disease with moderate left foraminal impingement at L3-4 and bilateral foraminal impingement at L4-5, and moderate bilateral hip arthropathy. 7. Additional chronic/incidental findings including Foley catheter within the urinary bladder, right femoral venous line terminating in the right external iliac vein, left femoral venous line terminating in the common femoral vein, sigmoid diverticulosis and scattered colonic diverticula, calcified uterine fibroids, and a supraumbilical fat-containing hernia. Electronically signed by: Ryan Salvage MD 07/13/2024 08:46 AM EST RP Workstation: [id number]   CT Head Wo Contrast Result Date: 07/13/2024 CLINICAL DATA:  Altered mental status. EXAM: CT HEAD WITHOUT CONTRAST TECHNIQUE: Contiguous axial images were obtained from the base of the skull through the vertex without intravenous contrast. RADIATION DOSE REDUCTION: This exam was performed according to the departmental dose-optimization program which includes automated exposure control, adjustment of the mA and/or kV according to patient size and/or use of iterative reconstruction technique. COMPARISON:  05/15/2020 FINDINGS: Brain: There is no evidence for acute hemorrhage, hydrocephalus, mass lesion, or abnormal extra-axial fluid collection. No definite CT evidence for acute infarction. Vascular: No hyperdense vessel or unexpected calcification. Skull: No evidence for fracture. No  worrisome lytic or sclerotic lesion. Sinuses/Orbits: The visualized paranasal sinuses and mastoid air cells are clear. Old medial right  orbital wall fracture again noted. Other: None. IMPRESSION: No acute intracranial abnormality. Electronically Signed   By: Camellia Candle M.D.   On: 07/13/2024 08:40   US  RENAL Result Date: 07/12/2024 CLINICAL DATA:  Acute kidney injury EXAM: RENAL / URINARY TRACT ULTRASOUND COMPLETE COMPARISON:  Ultrasound 11/22/2020 FINDINGS: Right Kidney: Renal measurements: 9.5 x 5.8 x 4.7 cm = volume: 134 mL. Echogenic cortex. No mass or hydronephrosis Left Kidney: Renal measurements: 8.4 x 4.9 x 4.5 cm = volume: 96.2 mL. Echogenic cortex. No mass or hydronephrosis. Bladder: Appears normal for degree of bladder distention. Other: None. IMPRESSION: Slightly small but symmetric kidneys with echogenic cortex consistent with medical renal disease and mild atrophy/scarring. No hydronephrosis Electronically Signed   By: Luke Bun M.D.   On: 07/12/2024 23:42   DG Chest Port 1 View Result Date: 07/12/2024 CLINICAL DATA:  Questionable sepsis. EXAM: PORTABLE CHEST 1 VIEW COMPARISON:  Chest radiograph dated 05/20/2016. FINDINGS: Cardiomegaly with vascular congestion. No focal consolidation, pleural effusion, or pneumothorax. No acute osseous pathology. IMPRESSION: Cardiomegaly with vascular congestion. No focal consolidation. Electronically Signed   By: Vanetta Chou M.D.   On: 07/12/2024 19:57     Discharge Exam: Vitals:   08/09/24 0108 08/09/24 0454  BP: (!) 151/90 (!) 148/103  Pulse: 67 71  Resp: 20 20  Temp: 98 F (36.7 C) 98.1 F (36.7 C)  SpO2: 93% 95%   Vitals:   08/08/24 1553 08/08/24 1959 08/09/24 0108 08/09/24 0454  BP: 131/87 (!) 156/87 (!) 151/90 (!) 148/103  Pulse: 98 81 67 71  Resp:  20 20 20   Temp: 97.8 F (36.6 C) 97.7 F (36.5 C) 98 F (36.7 C) 98.1 F (36.7 C)  TempSrc: Oral Oral Oral Oral  SpO2: 99% 93% 93% 95%  Weight:      Height:        General: Pt is alert, awake, not in acute distress Cardiovascular: RRR, S1/S2 +, no rubs, no gallops Respiratory: CTA bilaterally, no  wheezing, no rhonchi Abdominal: Soft, NT, ND, bowel sounds + Extremities: no edema, no cyanosis    The results of significant diagnostics from this hospitalization (including imaging, microbiology, ancillary and laboratory) are listed below for reference.     Microbiology: Recent Results (from the past 240 hours)  Culture, blood (Routine X 2) w Reflex to ID Panel     Status: Abnormal   Collection Time: 08/04/24  3:30 PM   Specimen: BLOOD  Result Value Ref Range Status   Specimen Description BLOOD RIGHT ANTECUBITAL  Final   Special Requests   Final    BOTTLES DRAWN AEROBIC AND ANAEROBIC Blood Culture results may not be optimal due to an inadequate volume of blood received in culture bottles   Culture  Setup Time   Final    GRAM POSITIVE COCCI AEROBIC BOTTLE ONLY CRITICAL RESULT CALLED TO, READ BACK BY AND VERIFIED WITH: PHARMD JEREMY F 1435 [id number] FCP    Culture (A)  Final    STREPTOCOCCUS PARASANGUINIS CORRECTED RESULTS PREVIOUSLY REPORTED AS: STREPTOCOCCUS SALIVARIUS Results Called to: MAYA RANKIN SAMS [id number] AT 1104 BY EC Performed at [facility], [address], [place], [place] [address]    Report Status 08/07/2024 FINAL  Final   Organism ID, Bacteria STREPTOCOCCUS PARASANGUINIS  Final      Susceptibility   Streptococcus parasanguinis - MIC*    PENICILLIN 0.25 INTERMEDIATE Intermediate  CEFTRIAXONE <=0.12 SENSITIVE Sensitive     ERYTHROMYCIN 1 RESISTANT Resistant     LEVOFLOXACIN 1 SENSITIVE Sensitive     VANCOMYCIN  0.25 SENSITIVE Sensitive     * STREPTOCOCCUS PARASANGUINIS  Blood Culture ID Panel (Reflexed)     Status: Abnormal   Collection Time: 08/04/24  3:30 PM  Result Value Ref Range Status   Enterococcus faecalis NOT DETECTED NOT DETECTED Final   Enterococcus Faecium NOT DETECTED NOT DETECTED Final   Listeria monocytogenes NOT DETECTED NOT DETECTED Final   Staphylococcus species NOT DETECTED NOT DETECTED Final   Staphylococcus aureus (BCID) NOT  DETECTED NOT DETECTED Final   Staphylococcus epidermidis NOT DETECTED NOT DETECTED Final   Staphylococcus lugdunensis NOT DETECTED NOT DETECTED Final   Streptococcus species DETECTED (A) NOT DETECTED Final    Comment: Not Enterococcus species, Streptococcus agalactiae, Streptococcus pyogenes, or Streptococcus pneumoniae. CRITICAL RESULT CALLED TO, READ BACK BY AND VERIFIED WITH: PHARMD JEREMY F 1435 [id number] FCP    Streptococcus agalactiae NOT DETECTED NOT DETECTED Final   Streptococcus pneumoniae NOT DETECTED NOT DETECTED Final   Streptococcus pyogenes NOT DETECTED NOT DETECTED Final   A.calcoaceticus-baumannii NOT DETECTED NOT DETECTED Final   Bacteroides fragilis NOT DETECTED NOT DETECTED Final   Enterobacterales NOT DETECTED NOT DETECTED Final   Enterobacter cloacae complex NOT DETECTED NOT DETECTED Final   Escherichia coli NOT DETECTED NOT DETECTED Final   Klebsiella aerogenes NOT DETECTED NOT DETECTED Final   Klebsiella oxytoca NOT DETECTED NOT DETECTED Final   Klebsiella pneumoniae NOT DETECTED NOT DETECTED Final   Proteus species NOT DETECTED NOT DETECTED Final   Salmonella species NOT DETECTED NOT DETECTED Final   Serratia marcescens NOT DETECTED NOT DETECTED Final   Haemophilus influenzae NOT DETECTED NOT DETECTED Final   Neisseria meningitidis NOT DETECTED NOT DETECTED Final   Pseudomonas aeruginosa NOT DETECTED NOT DETECTED Final   Stenotrophomonas maltophilia NOT DETECTED NOT DETECTED Final   Candida albicans NOT DETECTED NOT DETECTED Final   Candida auris NOT DETECTED NOT DETECTED Final   Candida glabrata NOT DETECTED NOT DETECTED Final   Candida krusei NOT DETECTED NOT DETECTED Final   Candida parapsilosis NOT DETECTED NOT DETECTED Final   Candida tropicalis NOT DETECTED NOT DETECTED Final   Cryptococcus neoformans/gattii NOT DETECTED NOT DETECTED Final    Comment: Performed at [facility], [address], [place], [place] [address]  Culture, blood (Routine X 2)  w Reflex to ID Panel     Status: None (Preliminary result)   Collection Time: 08/04/24  7:20 PM   Specimen: BLOOD RIGHT HAND  Result Value Ref Range Status   Specimen Description BLOOD RIGHT HAND  Final   Special Requests   Final    BOTTLES DRAWN AEROBIC AND ANAEROBIC Blood Culture adequate volume   Culture   Final    NO GROWTH 4 DAYS Performed at [facility], [address] [place], [place] [address]    Report Status PENDING  Incomplete  Aerobic/Anaerobic Culture w Gram Stain (surgical/deep wound)     Status: None (Preliminary result)   Collection Time: 08/06/24 12:17 PM   Specimen: Path fluid  Result Value Ref Range Status   Specimen Description FLUID  Final   Special Requests SYNOVIAL,RIGHT KNEE  Final   Gram Stain   Final    RARE WBC PRESENT, PREDOMINANTLY PMN NO ORGANISMS SEEN    Culture   Final    NO GROWTH 2 DAYS NO ANAEROBES ISOLATED; CULTURE IN PROGRESS FOR 5 DAYS Performed  at [facility], [address] [place], [place] [address]    Report Status PENDING  Incomplete     Labs: BNP (last 3 results) No results for input(s): BNP in the last 8760 hours. Basic Metabolic Panel: Recent Labs  Lab 08/04/24 1322 08/05/24 0452 08/06/24 1029 08/07/24 1314  NA 136 139 138 136  K 5.3* 4.7 4.9 5.1  CL 104 107 109 108  CO2 21* 22 19* 18*  GLUCOSE 134* 108* 120* 185*  BUN 49* 40* 30* 24*  CREATININE 3.14* 2.28* 1.57* 1.26*  CALCIUM  8.3* 8.3* 8.3* 8.6*   Liver Function Tests: Recent Labs  Lab 08/06/24 1029  AST 18  ALT 16  ALKPHOS 90  BILITOT 0.2  PROT 6.5  ALBUMIN  3.5   No results for input(s): LIPASE, AMYLASE in the last 168 hours. No results for input(s): AMMONIA in the last 168 hours. CBC: Recent Labs  Lab 08/04/24 1322 08/05/24 0452 08/06/24 1029 08/07/24 1314  WBC 9.8 6.7 6.7 7.9  NEUTROABS  --   --   --  6.4  HGB 8.3* 7.7* 7.8* 8.3*  HCT 29.3* 26.3* 26.7* 27.9*  MCV 91.8 90.7 89.9 89.1  PLT 291 258 238 287   Cardiac  Enzymes: Recent Labs  Lab 08/04/24 1536  CKTOTAL 148   BNP: Invalid input(s): POCBNP CBG: Recent Labs  Lab 08/08/24 0841 08/08/24 1133 08/08/24 1552 08/08/24 2001 08/09/24 0839  GLUCAP 173* 144* 189* 165* 160*   D-Dimer No results for input(s): DDIMER in the last 72 hours. Hgb A1c No results for input(s): HGBA1C in the last 72 hours. Lipid Profile No results for input(s): CHOL, HDL, LDLCALC, TRIG, CHOLHDL, LDLDIRECT in the last 72 hours. Thyroid function studies No results for input(s): TSH, T4TOTAL, T3FREE, THYROIDAB in the last 72 hours.  Invalid input(s): FREET3 Anemia work up No results for input(s): VITAMINB12, FOLATE, FERRITIN, TIBC, IRON, RETICCTPCT in the last 72 hours. Urinalysis    Component Value Date/Time   COLORURINE YELLOW 08/04/2024 1446   APPEARANCEUR HAZY (A) 08/04/2024 1446   LABSPEC 1.024 08/04/2024 1446   PHURINE 5.0 08/04/2024 1446   GLUCOSEU 50 (A) 08/04/2024 1446   HGBUR NEGATIVE 08/04/2024 1446   BILIRUBINUR NEGATIVE 08/04/2024 1446   KETONESUR NEGATIVE 08/04/2024 1446   PROTEINUR NEGATIVE 08/04/2024 1446   NITRITE NEGATIVE 08/04/2024 1446   LEUKOCYTESUR TRACE (A) 08/04/2024 1446   Sepsis Labs Recent Labs  Lab 08/04/24 1322 08/05/24 0452 08/06/24 1029 08/07/24 1314  WBC 9.8 6.7 6.7 7.9   Microbiology Recent Results (from the past 240 hours)  Culture, blood (Routine X 2) w Reflex to ID Panel     Status: Abnormal   Collection Time: 08/04/24  3:30 PM   Specimen: BLOOD  Result Value Ref Range Status   Specimen Description BLOOD RIGHT ANTECUBITAL  Final   Special Requests   Final    BOTTLES DRAWN AEROBIC AND ANAEROBIC Blood Culture results may not be optimal due to an inadequate volume of blood received in culture bottles   Culture  Setup Time   Final    GRAM POSITIVE COCCI AEROBIC BOTTLE ONLY CRITICAL RESULT CALLED TO, READ BACK BY AND VERIFIED WITH: PHARMD JEREMY F 1435 [id number] FCP     Culture (A)  Final    STREPTOCOCCUS PARASANGUINIS CORRECTED RESULTS PREVIOUSLY REPORTED AS: STREPTOCOCCUS SALIVARIUS Results Called to: MAYA RANKIN SAMS [id number] AT 1104 BY EC Performed at [facility], [address] [place], [place] [address]    Report Status 08/07/2024  FINAL  Final   Organism ID, Bacteria STREPTOCOCCUS PARASANGUINIS  Final      Susceptibility   Streptococcus parasanguinis - MIC*    PENICILLIN 0.25 INTERMEDIATE Intermediate     CEFTRIAXONE <=0.12 SENSITIVE Sensitive     ERYTHROMYCIN 1 RESISTANT Resistant     LEVOFLOXACIN 1 SENSITIVE Sensitive     VANCOMYCIN  0.25 SENSITIVE Sensitive     * STREPTOCOCCUS PARASANGUINIS  Blood Culture ID Panel (Reflexed)     Status: Abnormal   Collection Time: 08/04/24  3:30 PM  Result Value Ref Range Status   Enterococcus faecalis NOT DETECTED NOT DETECTED Final   Enterococcus Faecium NOT DETECTED NOT DETECTED Final   Listeria monocytogenes NOT DETECTED NOT DETECTED Final   Staphylococcus species NOT DETECTED NOT DETECTED Final   Staphylococcus aureus (BCID) NOT DETECTED NOT DETECTED Final   Staphylococcus epidermidis NOT DETECTED NOT DETECTED Final   Staphylococcus lugdunensis NOT DETECTED NOT DETECTED Final   Streptococcus species DETECTED (A) NOT DETECTED Final    Comment: Not Enterococcus species, Streptococcus agalactiae, Streptococcus pyogenes, or Streptococcus pneumoniae. CRITICAL RESULT CALLED TO, READ BACK BY AND VERIFIED WITH: PHARMD JEREMY F 1435 [id number] FCP    Streptococcus agalactiae NOT DETECTED NOT DETECTED Final   Streptococcus pneumoniae NOT DETECTED NOT DETECTED Final   Streptococcus pyogenes NOT DETECTED NOT DETECTED Final   A.calcoaceticus-baumannii NOT DETECTED NOT DETECTED Final   Bacteroides fragilis NOT DETECTED NOT DETECTED Final   Enterobacterales NOT DETECTED NOT DETECTED Final   Enterobacter cloacae complex NOT DETECTED NOT DETECTED Final   Escherichia coli NOT DETECTED NOT DETECTED Final    Klebsiella aerogenes NOT DETECTED NOT DETECTED Final   Klebsiella oxytoca NOT DETECTED NOT DETECTED Final   Klebsiella pneumoniae NOT DETECTED NOT DETECTED Final   Proteus species NOT DETECTED NOT DETECTED Final   Salmonella species NOT DETECTED NOT DETECTED Final   Serratia marcescens NOT DETECTED NOT DETECTED Final   Haemophilus influenzae NOT DETECTED NOT DETECTED Final   Neisseria meningitidis NOT DETECTED NOT DETECTED Final   Pseudomonas aeruginosa NOT DETECTED NOT DETECTED Final   Stenotrophomonas maltophilia NOT DETECTED NOT DETECTED Final   Candida albicans NOT DETECTED NOT DETECTED Final   Candida auris NOT DETECTED NOT DETECTED Final   Candida glabrata NOT DETECTED NOT DETECTED Final   Candida krusei NOT DETECTED NOT DETECTED Final   Candida parapsilosis NOT DETECTED NOT DETECTED Final   Candida tropicalis NOT DETECTED NOT DETECTED Final   Cryptococcus neoformans/gattii NOT DETECTED NOT DETECTED Final    Comment: Performed at [facility], [address] [place], [place] [address]  Culture, blood (Routine X 2) w Reflex to ID Panel     Status: None (Preliminary result)   Collection Time: 08/04/24  7:20 PM   Specimen: BLOOD RIGHT HAND  Result Value Ref Range Status   Specimen Description BLOOD RIGHT HAND  Final   Special Requests   Final    BOTTLES DRAWN AEROBIC AND ANAEROBIC Blood Culture adequate volume   Culture   Final    NO GROWTH 4 DAYS Performed at [facility], [address] [place], [place] [address]    Report Status PENDING  Incomplete  Aerobic/Anaerobic Culture w Gram Stain (surgical/deep wound)     Status: None (Preliminary result)   Collection Time: 08/06/24 12:17 PM   Specimen: Path fluid  Result Value Ref Range Status   Specimen Description FLUID  Final   Special Requests SYNOVIAL,RIGHT KNEE  Final   Gram Stain   Final  RARE WBC PRESENT, PREDOMINANTLY PMN NO ORGANISMS SEEN    Culture   Final    NO GROWTH 2 DAYS NO ANAEROBES ISOLATED;  CULTURE IN PROGRESS FOR 5 DAYS Performed at [facility], [address], [place], [place] [address]    Report Status PENDING  Incomplete    FURTHER DISCHARGE INSTRUCTIONS:   Get Medicines reviewed and adjusted: Please take all your medications with you for your next visit with your Primary MD   Laboratory/radiological data: Please request your Primary MD to go over all hospital tests and procedure/radiological results at the follow up, please ask your Primary MD to get all Hospital records sent to his/her office.   In some cases, they will be blood work, cultures and biopsy results pending at the time of your discharge. Please request that your primary care M.D. goes through all the records of your hospital data and follows up on these results.   Also Note the following: If you experience worsening of your admission symptoms, develop shortness of breath, life threatening emergency, suicidal or homicidal thoughts you must seek medical attention immediately by calling 911 or calling your MD immediately  if symptoms less severe.   You must read complete instructions/literature along with all the possible adverse reactions/side effects for all the Medicines you take and that have been prescribed to you. Take any new Medicines after you have completely understood and accpet all the possible adverse reactions/side effects.    patient was instructed, not to drive, operate heavy machinery, perform activities at heights, swimming or participation in water  activities or provide baby-sitting services while on Pain, Sleep and Anxiety Medications; until their outpatient Physician has advised to do so again. Also recommended to not to take more than prescribed Pain, Sleep and Anxiety Medications.  It is not advisable to combine anxiety, sleep and pain medications without talking with your primary care provider.     Wear Seat belts while driving.   Please note: You were cared for by a hospitalist  during your hospital stay. Once you are discharged, your primary care physician will handle any further medical issues. Please note that NO REFILLS for any discharge medications will be authorized once you are discharged, as it is imperative that you return to your primary care physician (or establish a relationship with a primary care physician if you do not have one) for your post hospital discharge needs so that they can reassess your need for medications and monitor your lab values  Time coordinating discharge: Over 30 minutes  SIGNED:   Fredia Skeeter, MD  [facility] 08/09/2024, 8:49 AM *Please note that this is a verbal dictation therefore any spelling or grammatical errors are due to the Dragon Medical One system interpretation. If 7PM-7AM, please contact night-coverage [email or web address]     [1]  Allergies Allergen Reactions   Penicillins Itching

## 2024-08-09 NOTE — TOC Transition Note (Addendum)
 Transition of Care [facility]) - Discharge Note   Patient Details  Name: Alison Whitehead MRN: [id number] Date of Birth: 1950-11-04  [facility] CM/SW Contact:  Luann SHAUNNA Cumming, LCSW Phone Number: 08/09/2024, 12:07 PM   Clinical Narrative:     Auth approved 8/3-8/87 [id number]  Per MD patient ready for DC to [facility] RN, patient, patient's family, and facility notified of DC. Discharge Summary and FL2 sent to facility. RN to call report prior to discharge [phone]). DC packet on chart. Ambulance transport requested for patient.   CSW will sign off for now as social work intervention is no longer needed. Please consult us  again if new needs arise.   Final next level of care: Skilled Nursing Facility Barriers to Discharge: No Barriers Identified          Discharge Placement              Patient chooses bed at: [facility] Patient to be transferred to facility by: ptar   Patient and family notified of of transfer: 08/09/24  Discharge Plan and Services Additional resources added to the After Visit Summary for   In-house Referral: Clinical Social Work                                   Social Drivers of Health (SDOH) Interventions SDOH Screenings   Food Insecurity: No Food Insecurity (08/04/2024)  Housing: Low Risk (08/04/2024)  Transportation Needs: No Transportation Needs (08/04/2024)  Utilities: Not At Risk (08/04/2024)  Depression (PHQ2-9): Low Risk (07/02/2022)  Social Connections: Unknown (08/04/2024)  Recent Concern: Social Connections - Moderately Isolated (07/18/2024)  Tobacco Use: Medium Risk (08/04/2024)     Readmission Risk Interventions    12/28/2022    1:24 PM  Readmission Risk Prevention Plan  Post Dischage Appt Complete  Medication Screening Not Complete  Medication Screening Not Complete Comment N/A  Transportation Screening Not Complete  Transportation Screening Comment N/A

## 2024-08-11 LAB — AEROBIC/ANAEROBIC CULTURE W GRAM STAIN (SURGICAL/DEEP WOUND): Culture: NO GROWTH

## 2024-08-25 ENCOUNTER — Emergency Department (HOSPITAL_COMMUNITY)

## 2024-08-25 ENCOUNTER — Encounter (HOSPITAL_COMMUNITY): Payer: Self-pay | Admitting: Emergency Medicine

## 2024-08-25 ENCOUNTER — Other Ambulatory Visit: Payer: Self-pay

## 2024-08-25 ENCOUNTER — Inpatient Hospital Stay (HOSPITAL_COMMUNITY)
Admission: EM | Admit: 2024-08-25 | Discharge: 2024-09-02 | DRG: 485 | Disposition: A | Discharge status: Skilled Nursing Facility | Attending: Family Medicine | Admitting: Family Medicine

## 2024-08-25 DIAGNOSIS — R7303 Prediabetes: Secondary | ICD-10-CM | POA: Diagnosis present

## 2024-08-25 DIAGNOSIS — K8689 Other specified diseases of pancreas: Secondary | ICD-10-CM | POA: Diagnosis present

## 2024-08-25 DIAGNOSIS — Z79899 Other long term (current) drug therapy: Secondary | ICD-10-CM

## 2024-08-25 DIAGNOSIS — Z87891 Personal history of nicotine dependence: Secondary | ICD-10-CM | POA: Diagnosis not present

## 2024-08-25 DIAGNOSIS — N179 Acute kidney failure, unspecified: Secondary | ICD-10-CM | POA: Diagnosis present

## 2024-08-25 DIAGNOSIS — W06XXXA Fall from bed, initial encounter: Secondary | ICD-10-CM | POA: Diagnosis present

## 2024-08-25 DIAGNOSIS — M1711 Unilateral primary osteoarthritis, right knee: Secondary | ICD-10-CM | POA: Diagnosis present

## 2024-08-25 DIAGNOSIS — Z7982 Long term (current) use of aspirin: Secondary | ICD-10-CM

## 2024-08-25 DIAGNOSIS — B9562 Methicillin resistant Staphylococcus aureus infection as the cause of diseases classified elsewhere: Secondary | ICD-10-CM | POA: Diagnosis not present

## 2024-08-25 DIAGNOSIS — Z7984 Long term (current) use of oral hypoglycemic drugs: Secondary | ICD-10-CM

## 2024-08-25 DIAGNOSIS — T8454XA Infection and inflammatory reaction due to internal left knee prosthesis, initial encounter: Secondary | ICD-10-CM | POA: Diagnosis present

## 2024-08-25 DIAGNOSIS — N189 Chronic kidney disease, unspecified: Secondary | ICD-10-CM | POA: Diagnosis not present

## 2024-08-25 DIAGNOSIS — Z515 Encounter for palliative care: Secondary | ICD-10-CM

## 2024-08-25 DIAGNOSIS — A4102 Sepsis due to Methicillin resistant Staphylococcus aureus: Secondary | ICD-10-CM | POA: Diagnosis present

## 2024-08-25 DIAGNOSIS — I7121 Aneurysm of the ascending aorta, without rupture: Secondary | ICD-10-CM | POA: Diagnosis present

## 2024-08-25 DIAGNOSIS — M1710 Unilateral primary osteoarthritis, unspecified knee: Secondary | ICD-10-CM | POA: Diagnosis present

## 2024-08-25 DIAGNOSIS — R6251 Failure to thrive (child): Secondary | ICD-10-CM | POA: Diagnosis present

## 2024-08-25 DIAGNOSIS — I2699 Other pulmonary embolism without acute cor pulmonale: Secondary | ICD-10-CM | POA: Insufficient documentation

## 2024-08-25 DIAGNOSIS — M549 Dorsalgia, unspecified: Secondary | ICD-10-CM | POA: Diagnosis not present

## 2024-08-25 DIAGNOSIS — R296 Repeated falls: Secondary | ICD-10-CM | POA: Diagnosis present

## 2024-08-25 DIAGNOSIS — I82452 Acute embolism and thrombosis of left peroneal vein: Secondary | ICD-10-CM | POA: Diagnosis present

## 2024-08-25 DIAGNOSIS — Z8249 Family history of ischemic heart disease and other diseases of the circulatory system: Secondary | ICD-10-CM

## 2024-08-25 DIAGNOSIS — I82462 Acute embolism and thrombosis of left calf muscular vein: Secondary | ICD-10-CM | POA: Diagnosis not present

## 2024-08-25 DIAGNOSIS — T8454XD Infection and inflammatory reaction due to internal left knee prosthesis, subsequent encounter: Secondary | ICD-10-CM | POA: Diagnosis not present

## 2024-08-25 DIAGNOSIS — E860 Dehydration: Secondary | ICD-10-CM | POA: Diagnosis present

## 2024-08-25 DIAGNOSIS — I361 Nonrheumatic tricuspid (valve) insufficiency: Secondary | ICD-10-CM | POA: Diagnosis not present

## 2024-08-25 DIAGNOSIS — I10 Essential (primary) hypertension: Secondary | ICD-10-CM | POA: Diagnosis present

## 2024-08-25 DIAGNOSIS — E66812 Obesity, class 2: Secondary | ICD-10-CM | POA: Diagnosis present

## 2024-08-25 DIAGNOSIS — I2693 Single subsegmental pulmonary embolism without acute cor pulmonale: Secondary | ICD-10-CM | POA: Diagnosis present

## 2024-08-25 DIAGNOSIS — E1122 Type 2 diabetes mellitus with diabetic chronic kidney disease: Secondary | ICD-10-CM | POA: Diagnosis present

## 2024-08-25 DIAGNOSIS — S5001XA Contusion of right elbow, initial encounter: Secondary | ICD-10-CM | POA: Diagnosis present

## 2024-08-25 DIAGNOSIS — E872 Acidosis, unspecified: Secondary | ICD-10-CM | POA: Diagnosis present

## 2024-08-25 DIAGNOSIS — R7881 Bacteremia: Principal | ICD-10-CM

## 2024-08-25 DIAGNOSIS — R5381 Other malaise: Secondary | ICD-10-CM | POA: Diagnosis present

## 2024-08-25 DIAGNOSIS — D649 Anemia, unspecified: Secondary | ICD-10-CM

## 2024-08-25 DIAGNOSIS — G8929 Other chronic pain: Secondary | ICD-10-CM | POA: Diagnosis present

## 2024-08-25 DIAGNOSIS — M79606 Pain in leg, unspecified: Secondary | ICD-10-CM | POA: Diagnosis not present

## 2024-08-25 DIAGNOSIS — Z66 Do not resuscitate: Secondary | ICD-10-CM | POA: Diagnosis present

## 2024-08-25 DIAGNOSIS — I129 Hypertensive chronic kidney disease with stage 1 through stage 4 chronic kidney disease, or unspecified chronic kidney disease: Secondary | ICD-10-CM | POA: Diagnosis present

## 2024-08-25 DIAGNOSIS — I6350 Cerebral infarction due to unspecified occlusion or stenosis of unspecified cerebral artery: Secondary | ICD-10-CM | POA: Diagnosis present

## 2024-08-25 DIAGNOSIS — M545 Low back pain, unspecified: Secondary | ICD-10-CM | POA: Diagnosis present

## 2024-08-25 DIAGNOSIS — M009 Pyogenic arthritis, unspecified: Secondary | ICD-10-CM | POA: Diagnosis not present

## 2024-08-25 DIAGNOSIS — I38 Endocarditis, valve unspecified: Secondary | ICD-10-CM | POA: Diagnosis not present

## 2024-08-25 DIAGNOSIS — D631 Anemia in chronic kidney disease: Secondary | ICD-10-CM | POA: Diagnosis present

## 2024-08-25 DIAGNOSIS — Z6835 Body mass index (BMI) 35.0-35.9, adult: Secondary | ICD-10-CM

## 2024-08-25 DIAGNOSIS — R531 Weakness: Principal | ICD-10-CM

## 2024-08-25 DIAGNOSIS — R627 Adult failure to thrive: Secondary | ICD-10-CM | POA: Diagnosis present

## 2024-08-25 DIAGNOSIS — M25462 Effusion, left knee: Secondary | ICD-10-CM | POA: Diagnosis not present

## 2024-08-25 DIAGNOSIS — K760 Fatty (change of) liver, not elsewhere classified: Secondary | ICD-10-CM | POA: Diagnosis present

## 2024-08-25 DIAGNOSIS — Y831 Surgical operation with implant of artificial internal device as the cause of abnormal reaction of the patient, or of later complication, without mention of misadventure at the time of the procedure: Secondary | ICD-10-CM | POA: Diagnosis present

## 2024-08-25 DIAGNOSIS — Z833 Family history of diabetes mellitus: Secondary | ICD-10-CM

## 2024-08-25 DIAGNOSIS — E875 Hyperkalemia: Secondary | ICD-10-CM | POA: Diagnosis present

## 2024-08-25 DIAGNOSIS — Z880 Allergy status to penicillin: Secondary | ICD-10-CM

## 2024-08-25 DIAGNOSIS — Z7985 Long-term (current) use of injectable non-insulin antidiabetic drugs: Secondary | ICD-10-CM

## 2024-08-25 DIAGNOSIS — N1832 Chronic kidney disease, stage 3b: Secondary | ICD-10-CM | POA: Diagnosis present

## 2024-08-25 DIAGNOSIS — E119 Type 2 diabetes mellitus without complications: Secondary | ICD-10-CM | POA: Diagnosis not present

## 2024-08-25 DIAGNOSIS — E785 Hyperlipidemia, unspecified: Secondary | ICD-10-CM | POA: Diagnosis present

## 2024-08-25 DIAGNOSIS — R7401 Elevation of levels of liver transaminase levels: Secondary | ICD-10-CM | POA: Diagnosis not present

## 2024-08-25 DIAGNOSIS — Z8673 Personal history of transient ischemic attack (TIA), and cerebral infarction without residual deficits: Secondary | ICD-10-CM

## 2024-08-25 LAB — COMPREHENSIVE METABOLIC PANEL WITH GFR
ALT: 23 U/L (ref 0–44)
AST: 16 U/L (ref 15–41)
Albumin: 3.6 g/dL (ref 3.5–5.0)
Alkaline Phosphatase: 114 U/L (ref 38–126)
Anion gap: 15 (ref 5–15)
BUN: 63 mg/dL — ABNORMAL HIGH (ref 8–23)
CO2: 18 mmol/L — ABNORMAL LOW (ref 22–32)
Calcium: 9.1 mg/dL (ref 8.9–10.3)
Chloride: 102 mmol/L (ref 98–111)
Creatinine, Ser: 2.59 mg/dL — ABNORMAL HIGH (ref 0.44–1.00)
GFR, Estimated: 19 mL/min — ABNORMAL LOW
Glucose, Bld: 109 mg/dL — ABNORMAL HIGH (ref 70–99)
Potassium: 5.7 mmol/L — ABNORMAL HIGH (ref 3.5–5.1)
Sodium: 136 mmol/L (ref 135–145)
Total Bilirubin: 0.3 mg/dL (ref 0.0–1.2)
Total Protein: 8.1 g/dL (ref 6.5–8.1)

## 2024-08-25 LAB — IRON AND TIBC
Iron: <10 ug/dL — ABNORMAL LOW (ref 28–170)
TIBC: 235 ug/dL — ABNORMAL LOW (ref 250–450)

## 2024-08-25 LAB — URINALYSIS, ROUTINE W REFLEX MICROSCOPIC
Bilirubin Urine: NEGATIVE
Glucose, UA: 50 mg/dL — AB
Hgb urine dipstick: NEGATIVE
Ketones, ur: NEGATIVE mg/dL
Nitrite: NEGATIVE
Protein, ur: NEGATIVE mg/dL
Specific Gravity, Urine: 1.016 (ref 1.005–1.030)
pH: 5 (ref 5.0–8.0)

## 2024-08-25 LAB — CBC
HCT: 27.6 % — ABNORMAL LOW (ref 36.0–46.0)
Hemoglobin: 8.2 g/dL — ABNORMAL LOW (ref 12.0–15.0)
MCH: 25.2 pg — ABNORMAL LOW (ref 26.0–34.0)
MCHC: 29.7 g/dL — ABNORMAL LOW (ref 30.0–36.0)
MCV: 84.9 fL (ref 80.0–100.0)
Platelets: 538 K/uL — ABNORMAL HIGH (ref 150–400)
RBC: 3.25 MIL/uL — ABNORMAL LOW (ref 3.87–5.11)
RDW: 16.1 % — ABNORMAL HIGH (ref 11.5–15.5)
WBC: 11.7 K/uL — ABNORMAL HIGH (ref 4.0–10.5)
nRBC: 0 % (ref 0.0–0.2)

## 2024-08-25 LAB — TSH: TSH: 0.795 u[IU]/mL (ref 0.350–4.500)

## 2024-08-25 LAB — MAGNESIUM: Magnesium: 2.5 mg/dL — ABNORMAL HIGH (ref 1.7–2.4)

## 2024-08-25 LAB — PHOSPHORUS: Phosphorus: 3.9 mg/dL (ref 2.5–4.6)

## 2024-08-25 LAB — FOLATE: Folate: >20 ng/mL

## 2024-08-25 LAB — VITAMIN B12: Vitamin B-12: 746 pg/mL (ref 180–914)

## 2024-08-25 LAB — CBG MONITORING, ED: Glucose-Capillary: 97 mg/dL (ref 70–99)

## 2024-08-25 MED ORDER — SODIUM CHLORIDE 0.9% FLUSH
3.0000 mL | Freq: Two times a day (BID) | INTRAVENOUS | Status: DC
  Administered 2024-08-26 – 2024-09-01 (×12): 3 mL via INTRAVENOUS

## 2024-08-25 MED ORDER — ACETAMINOPHEN 650 MG RE SUPP: 650.0000 mg | Freq: Four times a day (QID) | RECTAL | Status: DC | PRN

## 2024-08-25 MED ORDER — BISACODYL 5 MG PO TBEC: 5.0000 mg | DELAYED_RELEASE_TABLET | Freq: Every day | ORAL | Status: DC | PRN

## 2024-08-25 MED ORDER — INSULIN ASPART 100 UNIT/ML IJ SOLN
4.0000 [IU] | Freq: Once | INTRAMUSCULAR | Status: AC
  Administered 2024-08-25: 4 [IU] via INTRAVENOUS
  Filled 2024-08-25: qty 4

## 2024-08-25 MED ORDER — FLEET ENEMA RE ENEM: 1.0000 | ENEMA | Freq: Once | RECTAL | Status: DC | PRN

## 2024-08-25 MED ORDER — HYDRALAZINE HCL 20 MG/ML IJ SOLN: 10.0000 mg | INTRAMUSCULAR | Status: DC | PRN

## 2024-08-25 MED ORDER — SODIUM CHLORIDE 0.9 % IV SOLN: INTRAVENOUS | Status: AC

## 2024-08-25 MED ORDER — ALBUTEROL SULFATE (2.5 MG/3ML) 0.083% IN NEBU
5.0000 mg | INHALATION_SOLUTION | Freq: Once | RESPIRATORY_TRACT | Status: AC
  Administered 2024-08-25: 5 mg via RESPIRATORY_TRACT
  Filled 2024-08-25: qty 6

## 2024-08-25 MED ORDER — IPRATROPIUM BROMIDE 0.02 % IN SOLN: 0.5000 mg | Freq: Four times a day (QID) | RESPIRATORY_TRACT | Status: DC | PRN

## 2024-08-25 MED ORDER — OXYCODONE HCL 5 MG PO TABS
5.0000 mg | ORAL_TABLET | ORAL | Status: DC | PRN
  Administered 2024-08-26 – 2024-09-02 (×14): 5 mg via ORAL
  Filled 2024-08-25 (×14): qty 1

## 2024-08-25 MED ORDER — HEPARIN SODIUM (PORCINE) 5000 UNIT/ML IJ SOLN
5000.0000 [IU] | Freq: Three times a day (TID) | INTRAMUSCULAR | Status: DC
  Administered 2024-08-25 – 2024-08-28 (×8): 5000 [IU] via SUBCUTANEOUS
  Filled 2024-08-25 (×8): qty 1

## 2024-08-25 MED ORDER — SENNOSIDES-DOCUSATE SODIUM 8.6-50 MG PO TABS: 1.0000 | ORAL_TABLET | Freq: Every evening | ORAL | Status: DC | PRN

## 2024-08-25 MED ORDER — SODIUM CHLORIDE 0.9 % IV BOLUS
1000.0000 mL | Freq: Once | INTRAVENOUS | Status: AC
  Administered 2024-08-25: 1000 mL via INTRAVENOUS

## 2024-08-25 MED ORDER — SODIUM CHLORIDE 0.9% FLUSH
3.0000 mL | Freq: Two times a day (BID) | INTRAVENOUS | Status: DC
  Administered 2024-08-25 – 2024-09-02 (×16): 3 mL via INTRAVENOUS

## 2024-08-25 MED ORDER — ONDANSETRON HCL 4 MG PO TABS: 4.0000 mg | ORAL_TABLET | Freq: Four times a day (QID) | ORAL | Status: DC | PRN

## 2024-08-25 MED ORDER — ONDANSETRON HCL 4 MG/2ML IJ SOLN: 4.0000 mg | Freq: Four times a day (QID) | INTRAMUSCULAR | Status: DC | PRN

## 2024-08-25 MED ORDER — DEXTROSE 50 % IV SOLN
25.0000 g | Freq: Once | INTRAVENOUS | Status: AC
  Administered 2024-08-25: 25 g via INTRAVENOUS
  Filled 2024-08-25: qty 50

## 2024-08-25 MED ORDER — SODIUM BICARBONATE 8.4 % IV SOLN
50.0000 meq | Freq: Once | INTRAVENOUS | Status: AC
  Administered 2024-08-25: 50 meq via INTRAVENOUS
  Filled 2024-08-25: qty 50

## 2024-08-25 MED ORDER — TRAZODONE HCL 50 MG PO TABS
25.0000 mg | ORAL_TABLET | Freq: Every evening | ORAL | Status: DC | PRN
  Administered 2024-09-01: 25 mg via ORAL
  Filled 2024-08-25: qty 1

## 2024-08-25 MED ORDER — SODIUM ZIRCONIUM CYCLOSILICATE 5 G PO PACK
5.0000 g | PACK | Freq: Once | ORAL | Status: AC
  Administered 2024-08-25: 5 g via ORAL
  Filled 2024-08-25 (×2): qty 1

## 2024-08-25 MED ORDER — ACETAMINOPHEN 325 MG PO TABS
650.0000 mg | ORAL_TABLET | Freq: Four times a day (QID) | ORAL | Status: DC | PRN
  Administered 2024-08-26 – 2024-09-02 (×5): 650 mg via ORAL
  Filled 2024-08-25 (×5): qty 2

## 2024-08-25 MED ORDER — HYDROMORPHONE HCL 1 MG/ML IJ SOLN
0.5000 mg | INTRAMUSCULAR | Status: DC | PRN
  Administered 2024-08-25 – 2024-08-26 (×7): 1 mg via INTRAVENOUS
  Filled 2024-08-25 (×8): qty 1

## 2024-08-25 NOTE — ED Provider Triage Note (Signed)
 Emergency Medicine Provider Triage Evaluation Note  Alison Whitehead , a 74 y.o. female  was evaluated in triage.  Pt complains of pain to right elbow, also notes chronic pain to bil knees. Indicates ?fall out of bed a few days ago with right elbow pain since. No numbness/weakness. No fevers.  From report, pt with recent hospital and snf stays, and ?not thriving at home since recent d/c to home from snf.   Review of Systems  Positive: Elbow and knee pain. Negative: fevers  Physical Exam  BP 111/71 (BP Location: Left Arm)   Pulse (!) 103   Temp 99 F (37.2 C) (Oral)   Resp (!) 21   SpO2 97%  Gen:   Awake, no distress   Resp:  Normal effort  MSK:   Tenderness right elbow. No other focal bony tenderness noted on extremity exam. CTLS spine, non tender, aligned, no step off.   Medical Decision Making  Medically screening exam initiated at 11:12 AM.  Appropriate orders placed.  Glennis Borger was informed that the remainder of the evaluation will be completed by another provider, this initial triage assessment does not replace that evaluation, and the importance of remaining in the ED until their evaluation is complete.  Labs and imaging ordered.    Bernard Drivers, MD 08/25/24 1113

## 2024-08-25 NOTE — ED Notes (Signed)
 Daughter Alison Whitehead [phone] would like an update asap

## 2024-08-25 NOTE — ED Notes (Signed)
 The daughter would like an updat

## 2024-08-25 NOTE — Consult Note (Signed)
 "                              Consultation Note Date: 08/25/2024   Patient Name: Alison Whitehead  DOB: May 20, 1951  MRN: [id number]  Age / Sex: 74 y.o., female  PCP: [name], PA-C Referring Physician: Willette Adriana LABOR, MD  Reason for Consultation: Establishing goals of care  HPI/Patient Profile: 74 y.o. female  with past medical history of CKD IIIb, HTN, HLD, anemia of chronic disease, prediabetic, chronic pancreatic insufficiency, chronic knee pain admitted on 08/25/2024 with fall at home, poor oral intake, worsening weakness.   In the ED, patient was found to have AKI on CKD 3B likely due to dehydration, hyperkalemia, elbow pain due to her fall.  X-ray was reviewed, notable for some posterior swelling but no acute fracture.  Creatinine at 2.59, up from 1.26 on 1/4.  Mild leukocytosis and anemia.  She is being admitted for failure to thrive and therapies with anticipation of need to return to SNF/rehab.  Patient has had 2 admissions in the past 6 months for similar presentations, most recently less than 30 days ago.  PMT has been consulted to assist with goals of care conversation.  Clinical Assessment and Goals of Care:  I have reviewed medical records including EPIC notes, labs and imaging, discussed with admitting hospitalist at the bedside, assessed the patient and discussed diagnosis, prognosis, GOC, EOL wishes, disposition and options.  I introduced Palliative Medicine as specialized medical care for people living with serious illness. It focuses on providing relief from the symptoms and stress of a serious illness. The goal is to improve quality of life for both the patient and the family.  We discussed a brief life review of the patient and then focused on their current illness.   I attempted to elicit values and goals of care important to the patient.    Medical History Review and Understanding:  We discussed patient's acute illness in the context of their  chronic comorbidities.   Social History: Patient is widowed for several years.  She has 2 daughters, 1 who lives in [place] and 1 lives in [place].  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  Functional and Nutritional State: Uses a cane/walker at baseline.  Recurrent falls and declining functional status.  Albumin  surprisingly normal at 3.6  Palliative Symptoms: Chronic bilateral knee and back pain Denies anxiety, itching, bowel problems, or other symptoms  Code Status: Concepts specific to code status, artifical feeding and hydration, and rehospitalization were considered and discussed.   Discussion: Patient shares that she has heard of palliative care in the past, though due to her intermittent pain throughout the conversation, she was distracted and did not tell me more information about this experience.  Her husband died suddenly and they never had goals of care about his wishes, nor did they discussed this about her own wishes.  She did not express any specific goals or milestones that she hopes to obtain, but does want to improve from this gradual and persistent decline.  She understands that she is not safe to return home in her current condition.  She would be open to a family meeting to include her daughters. Counseled on the importance of considering best case and worst-case scenario, particularly in her situation where it appears repeated hospital stays are not changing her overall trajectory.  Explored her thoughts and feelings on what she would like the medical team  to do if she were to decline rapidly during admission.  Reviewed option of escalation of care to the ICU, which she does not want to do.  She also clearly states that she would never want a feeding tube, breathing tube, or dialysis.  We discussed CPR and I provided education on what this entails.  She would not want this either.   I was able to offer her spiritual care consult and assist with requesting oxycodone  for her pain,  then concluding our conversation due to her discomfort.  Attempted to call her daughter, but was unable to reach.  Voicemail was provided with family contact information.   The difference between aggressive medical intervention and comfort care was considered in light of the patient's goals of care.   Discussed the importance of continued conversation with family and the medical providers regarding overall plan of care and treatment options, ensuring decisions are within the context of the patients values and GOCs.   Questions and concerns were addressed.  Hard Choices booklet left for review. The family was encouraged to call with questions or concerns.  PMT will continue to support holistically.   SUMMARY OF RECOMMENDATIONS   - CODE STATUS changed to DNR/DNI - Continue current care plan.  If patient declines, she does not want escalation of care to the ICU for aggressive care.  No feeding tube or dialysis - Spiritual care consult at patient's request - Psychosocial and emotional support provided - Ongoing goals of care discussions and palliative support   Prognosis:  Poor long-term prognosis  Discharge Planning: Skilled Nursing Facility for rehab with Palliative care service follow-up      Primary Diagnoses: Present on Admission:  Failure to thrive (child)  ARTHRITIS, KNEE  Debility  Essential hypertension  Prediabetes  Primary osteoarthritis of right knee  CKD stage 3b, GFR 30-44 ml/min (HCC)  Hyperkalemia  Right pontine stroke [facility])   Physical Exam Vitals and nursing note reviewed.  Constitutional:      General: She is not in acute distress.    Appearance: She is ill-appearing.  HENT:     Head: Normocephalic and atraumatic.     Mouth/Throat:     Mouth: Mucous membranes are dry.  Cardiovascular:     Rate and Rhythm: Normal rate.  Pulmonary:     Effort: Pulmonary effort is normal. No respiratory distress.  Neurological:     Mental Status: She is alert and  oriented to person, place, and time.  Psychiatric:        Speech: Speech normal.        Behavior: Behavior is cooperative.    Vital Signs: BP 111/71 (BP Location: Left Arm)   Pulse (!) 103   Temp 99 F (37.2 C) (Oral)   Resp (!) 21   SpO2 97%  Pain Scale: 0-10   Pain Score: 8    SpO2: SpO2: 97 % O2 Device:SpO2: 97 % O2 Flow Rate: .    Rhylin Venters P Chue Berkovich, PA-C  Palliative Medicine Team Team phone # [phone]  Thank you for allowing the Palliative Medicine Team to assist in the care of this patient. Please utilize secure chat with additional questions, if there is no response within 30 minutes please call the above phone number.  Palliative Medicine Team providers are available by phone from 7am to 7pm daily and can be reached through the team cell phone.  Should this patient require assistance outside of these hours, please call the patient's attending physician.   I personally spent  a total of 75 minutes in the care of the patient today including preparing to see the patient, getting/reviewing separately obtained history, performing a medically appropriate exam/evaluation, counseling and educating, placing orders, referring and communicating with other health care professionals, and documenting clinical information in the EHR.   "

## 2024-08-25 NOTE — ED Provider Notes (Signed)
 " [place] EMERGENCY DEPARTMENT AT [facility] Provider Note   CSN: [id number] Arrival date & time: 08/25/24  1036     Patient presents with: Weakness and Pain   Alison Whitehead is a 74 y.o. female.   Pt complains of pain to right elbow, also notes chronic pain to bil knees. Indicates ?fall out of bed a few days ago with right elbow pain since. No numbness/weakness. No fevers.  From report, pt with recent hospital and snf stays, and ?not thriving at home since recent d/c to home from snf. Pt notes recently generally poor po intake, feels generally weak. Denies focal or unilateral numbness/weakness. Denies headache. No neck or back pain. No chest pain or sob. No abd pain or nv. No gu c/o.   The history is provided by the patient, medical records and the EMS personnel.  Weakness Associated symptoms: no abdominal pain, no chest pain, no cough, no diarrhea, no dysuria, no fever, no headaches, no shortness of breath and no vomiting        Prior to Admission medications  Medication Sig Start Date End Date Taking? Authorizing Provider  amLODipine -olmesartan  (AZOR ) 10-40 MG tablet Take 1 tablet by mouth daily.    [provider]  aspirin  EC 81 MG tablet Take 1 tablet (81 mg total) by mouth daily. 12/13/19   Penumalli, Eduard SAUNDERS, MD  atorvastatin  (LIPITOR) 40 MG tablet Take 1 tablet (40 mg total) by mouth daily. 03/01/21     carvedilol  (COREG ) 6.25 MG tablet Take 1 tablet (6.25 mg total) by mouth 2 (two) times daily. 07/26/24   Rashid, Farhan, MD  CREON  36000-114000 units CPEP capsule Take 36,000 Units by mouth in the morning and at bedtime. 01/22/22   [provider]  cyclobenzaprine  (FLEXERIL ) 5 MG tablet TAKE 1 TABLET BY MOUTH THREE TIMES A DAY AS NEEDED FOR MUSCLE SPASMS 05/06/23   Valdemar Rocky SAUNDERS, NP  dapagliflozin  propanediol (FARXIGA ) 10 MG TABS tablet Take 1 tablet (10 mg total) by mouth daily. 11/04/21   Dennise Hoes, MD  dicyclomine  (BENTYL ) 10 MG capsule  Take 1-2 capsules (10-20 mg total) by mouth 4 (four) times daily as needed for spasms. 07/26/24   Rashid, Farhan, MD  ferrous sulfate  325 (65 FE) MG tablet Take 325 mg by mouth daily with breakfast. 12/09/22   [provider]  furosemide  (LASIX ) 20 MG tablet Take 20 mg by mouth every morning. 06/30/24   [provider]  HYDROcodone -acetaminophen  (NORCO) 10-325 MG tablet Take 1 tablet by mouth every 6 (six) hours as needed for severe pain (pain score 7-10). 08/08/24   Vernon Ranks, MD  Melatonin 5 MG CAPS Take 5 mg by mouth at bedtime.    [provider]  metFORMIN  (GLUCOPHAGE ) 1000 MG tablet Take 1,000 mg by mouth every evening. 06/15/24   [provider]  naloxone [facility]) nasal spray 4 mg/0.1 mL  04/25/22   [provider]  OZEMPIC , 2 MG/DOSE, 8 MG/3ML SOPN Inject 2 mg into the skin once a week. 06/22/24   [provider]    Allergies: Penicillins    Review of Systems  Constitutional:  Negative for chills and fever.  HENT:  Negative for sore throat.   Eyes:  Negative for visual disturbance.  Respiratory:  Negative for cough and shortness of breath.   Cardiovascular:  Negative for chest pain.  Gastrointestinal:  Negative for abdominal pain, diarrhea and vomiting.  Genitourinary:  Negative for dysuria and flank pain.  Musculoskeletal:  Negative for  neck pain and neck stiffness.  Skin:  Negative for wound.  Neurological:  Positive for weakness. Negative for headaches.    Updated Vital Signs BP 111/71 (BP Location: Left Arm)   Pulse (!) 103   Temp 99 F (37.2 C) (Oral)   Resp (!) 21   SpO2 97%   Physical Exam Vitals and nursing note reviewed.  Constitutional:      Appearance: Normal appearance. She is well-developed.  HENT:     Head: Atraumatic.     Nose: Nose normal.     Mouth/Throat:     Mouth: Mucous membranes are moist.  Eyes:     General: No scleral icterus.    Conjunctiva/sclera: Conjunctivae normal.     Pupils: Pupils  are equal, round, and reactive to light.  Neck:     Trachea: No tracheal deviation.  Cardiovascular:     Rate and Rhythm: Normal rate and regular rhythm.     Pulses: Normal pulses.     Heart sounds: Normal heart sounds. No murmur heard.    No friction rub. No gallop.  Pulmonary:     Effort: Pulmonary effort is normal. No respiratory distress.     Breath sounds: Normal breath sounds.  Abdominal:     General: Bowel sounds are normal. There is no distension.     Palpations: Abdomen is soft.     Tenderness: There is no abdominal tenderness. There is no guarding.  Genitourinary:    Comments: No cva tenderness.  Musculoskeletal:        General: No swelling.     Cervical back: Normal range of motion and neck supple. No rigidity. No muscular tenderness.     Comments: Tenderness right elbow, otherwise good passive rom RUE without pain, radial pulse 2+. Mild tenderness bil knees anteriorly. Knees are stable. No sign of acute infection, no erythema or increased warmth, no large effusion noted. Bil legs are of normal color and warmth w intact distal pulses. CTLS spine, non tender, aligned, no step off.   Skin:    General: Skin is warm and dry.     Findings: No rash.  Neurological:     Mental Status: She is alert.     Comments: Alert, speech normal. GCS 15. Motor/sens grossly intact bi.   Psychiatric:        Mood and Affect: Mood normal.     (all labs ordered are listed, but only abnormal results are displayed) Results for orders placed or performed during the hospital encounter of 08/25/24  Comprehensive metabolic panel   Collection Time: 08/25/24 11:24 AM  Result Value Ref Range   Sodium 136 135 - 145 mmol/L   Potassium 5.7 (H) 3.5 - 5.1 mmol/L   Chloride 102 98 - 111 mmol/L   CO2 18 (L) 22 - 32 mmol/L   Glucose, Bld 109 (H) 70 - 99 mg/dL   BUN 63 (H) 8 - 23 mg/dL   Creatinine, Ser 7.40 (H) 0.44 - 1.00 mg/dL   Calcium  9.1 8.9 - 10.3 mg/dL   Total Protein 8.1 6.5 - 8.1 g/dL    Albumin  3.6 3.5 - 5.0 g/dL   AST 16 15 - 41 U/L   ALT 23 0 - 44 U/L   Alkaline Phosphatase 114 38 - 126 U/L   Total Bilirubin 0.3 0.0 - 1.2 mg/dL   GFR, Estimated 19 (L) >60 mL/min   Anion gap 15 5 - 15  CBC   Collection Time: 08/25/24 11:24 AM  Result Value Ref Range  WBC 11.7 (H) 4.0 - 10.5 K/uL   RBC 3.25 (L) 3.87 - 5.11 MIL/uL   Hemoglobin 8.2 (L) 12.0 - 15.0 g/dL   HCT 72.3 (L) 63.9 - 53.9 %   MCV 84.9 80.0 - 100.0 fL   MCH 25.2 (L) 26.0 - 34.0 pg   MCHC 29.7 (L) 30.0 - 36.0 g/dL   RDW 83.8 (H) 88.4 - 84.4 %   Platelets 538 (H) 150 - 400 K/uL   nRBC 0.0 0.0 - 0.2 %   DG ELBOW COMPLETE RIGHT (3+VIEW) Result Date: 08/25/2024 CLINICAL DATA:  Right elbow pain. EXAM: RIGHT ELBOW - COMPLETE 3+ VIEW COMPARISON:  None Available. FINDINGS: There is no evidence of fracture, dislocation, or joint effusion. Degenerative changes are seen along the coronoid process. Mild soft tissue swelling is seen along the right olecranon process. IMPRESSION: Mild degenerative changes and mild posterior soft tissue swelling without an acute osseous abnormality. Electronically Signed   By: Suzen Dials M.D.   On: 08/25/2024 12:32   DG Foot Complete Right Result Date: 08/06/2024 EXAM: 3 OR MORE VIEW(S) XRAY OF THE RIGHT FOOT 08/06/2024 04:14:00 PM COMPARISON: None available. CLINICAL HISTORY: Hallux valgus (acquired), right foot. FINDINGS: BONES AND JOINTS: Hallux valgus (acquired), right foot. Degenerative changes in the first metatarsophalangeal joint. Degenerative changes in the intertarsal joints. No acute fracture. No dislocation. No focal bone erosion or bone destruction. SOFT TISSUES: Mild overlying soft tissue swelling. No radiopaque soft tissue foreign bodies or soft tissue gas. IMPRESSION: 1. Degenerative changes in the first metatarsophalangeal and intertarsal joints. 2. No acute fracture or dislocation. 3. Mild overlying soft tissue swelling without radiopaque soft tissue foreign body or soft tissue  gas. Electronically signed by: Elsie Gravely MD 08/06/2024 08:19 PM EST RP Workstation: [id number]   CT KNEE RIGHT WO CONTRAST Result Date: 08/05/2024 CLINICAL DATA:  Knee pain.  Fall. EXAM: CT OF THE RIGHT KNEE WITHOUT CONTRAST TECHNIQUE: Multidetector CT imaging of the right knee was performed according to the standard protocol. Multiplanar CT image reconstructions were also generated. RADIATION DOSE REDUCTION: This exam was performed according to the departmental dose-optimization program which includes automated exposure control, adjustment of the mA and/or kV according to patient size and/or use of iterative reconstruction technique. COMPARISON:  Right knee radiographs dated 08/04/2024. FINDINGS: Bones/Joint/Cartilage No acute fracture or dislocation. Redemonstrated severe tricompartmental osteoarthritis of the knee most pronounced in the medial femorotibial compartment with severe joint space narrowing resulting in near bone-on-bone contact, subchondral sclerosis, subchondral cystic changes, and marginal osteophytosis. Bulky fragmented superior patellar pole osteophytes are again noted. Similar mild lateral subluxation of the tibia relative to the femur. Large knee joint effusion. Ligaments Ligaments are suboptimally evaluated by CT. Muscles and Tendons No appreciable acute muscular abnormality. The patellar tendon is intact. Attenuated appearance of the distal quadriceps tendon. Partial-thickness tear can not be excluded. Soft tissue Subcutaneous edema along the anterior and anterolateral knee. No loculated fluid collection. IMPRESSION: 1. No acute osseous abnormality. 2. Severe tricompartmental osteoarthritis of the knee most pronounced in the medial femorotibial compartment resulting in near bone-on-bone contact. Similar mild lateral subluxation of the tibia relative to the femur. 3. Large knee joint effusion. 4. Attenuated appearance of the distal quadriceps tendon. Partial-thickness tear can not be  excluded. 5. Subcutaneous edema along the anterior and anterolateral knee. Electronically Signed   By: Harrietta Sherry M.D.   On: 08/05/2024 12:42   DG Knee Complete 4 Views Right Result Date: 08/04/2024 EXAM: 4 OR MORE VIEW(S) XRAY OF THE RIGHT KNEE  08/04/2024 11:32:00 AM COMPARISON: Right knee series dated 05/05/2023. CLINICAL HISTORY: Fall, pain. FINDINGS: BONES AND JOINTS: No acute fracture. There is severe tricompartmental osteoarthritis of the right knee with mild lateral subluxation of the tibial plateau relative to the femoral condyles. There is moderate joint space narrowing, particularly involving the medial tibial femoral compartment. There is sclerosis of the articular surfaces. There is patellofemoral osteophytosis. There is a mild suprapatellar bursal effusion. SOFT TISSUES: There are vascular calcifications. IMPRESSION: 1. 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 2. Mild suprapatellar bursal effusion. Electronically signed by: Evalene Coho MD 08/04/2024 12:31 PM EST RP Workstation: [id number]   DG HIP UNILAT W OR W/O PELVIS 2-3 VIEWS RIGHT Result Date: 08/04/2024 EXAM: 2 or 3 VIEW(S) XRAY OF THE RIGHT HIP 08/04/2024 11:32:00 AM COMPARISON: Bilateral hip series dated 02/29/2004. CLINICAL HISTORY: fall, pain FINDINGS: BONES AND JOINTS: There is a new, ovoid sclerotic density present superior laterally within the right femoral neck measuring approximately 15 x 11 mm. No acute fracture. No malalignment. There are mild degenerative changes within the sacroiliac joints bilaterally. SOFT TISSUES: There are numerous phleboliths within the pelvis. LUMBAR SPINE: There are mild degenerative changes within the lower lumbar spine. IMPRESSION:  1. New ovoid sclerotic density in the superolateral right femoral neck measuring approximately 15 x 11 mm; indeterminate (bone island favored), consider follow-up imaging to document stability. 2. No acute fracture or dislocation. 3. Mild degenerative changes of the sacroiliac joints bilaterally and lower lumbar spine. Electronically signed by: Evalene Coho MD 08/04/2024 12:30 PM EST RP Workstation: [id number]     EKG: EKG Interpretation Date/Time:  Thursday August 25 2024 10:49:33 EST Ventricular Rate:  102 PR Interval:  174 QRS Duration:  80 QT Interval:  318 QTC Calculation: 414 R Axis:   23  Text Interpretation: Sinus tachycardia Low voltage QRS Borderline ECG When compared with ECG of 04-Aug-2024 14:36, rate has increased since first prior ekg Confirmed by Levander Houston [phone]) on 08/25/2024 10:54:22 AM  Radiology: [facility] ELBOW COMPLETE RIGHT (3+VIEW) Result Date: 08/25/2024 CLINICAL DATA:  Right elbow pain. EXAM: RIGHT ELBOW - COMPLETE 3+ VIEW COMPARISON:  None Available. FINDINGS: There is no evidence of fracture, dislocation, or joint effusion. Degenerative changes are seen along the coronoid process. Mild soft tissue swelling is seen along the right olecranon process. IMPRESSION: Mild degenerative changes and mild posterior soft tissue swelling without an acute osseous abnormality. Electronically Signed   By: Suzen Dials M.D.   On: 08/25/2024 12:32     Procedures   Medications Ordered in the ED  sodium chloride  0.9 % bolus 1,000 mL (has no administration in time range)  sodium bicarbonate  injection 50 mEq (has no administration in time range)  dextrose  50 % solution 25 g (has no administration in time range)  albuterol  (PROVENTIL ) (2.5 MG/3ML) 0.083% nebulizer solution 5 mg (has no administration in time range)  sodium zirconium cyclosilicate  (LOKELMA ) packet 5 g (has no administration in time range)  insulin  aspart (novoLOG ) injection 4 Units (has no administration in  time range)                                    Medical Decision Making Problems Addressed: AKI (acute kidney injury): acute illness or injury with systemic  symptoms that poses a threat to life or bodily functions Chronic anemia: chronic illness or injury Chronic knee pain, unspecified laterality: chronic illness or injury Contusion of right elbow, initial encounter: acute illness or injury 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 illness or injury with exacerbation, progression, or side effects of treatment that poses a threat to life or bodily functions Generalized weakness: acute illness or injury with systemic symptoms that poses a threat to life or bodily functions Hyperkalemia: acute illness or injury with systemic symptoms that poses a threat to life or bodily functions Stage 3b chronic kidney disease (HCC): chronic illness or injury with exacerbation, progression, or side effects of treatment that poses a threat to life or bodily functions  Amount and/or Complexity of Data Reviewed Independent Historian: EMS    Details: hx External Data Reviewed: notes. Labs: ordered. Decision-making details documented in ED Course. Radiology: ordered and independent interpretation performed. Decision-making details documented in ED Course. ECG/medicine tests: ordered and independent interpretation performed. Decision-making details documented in ED Course. Discussion of management or test interpretation with external provider(s): medicine  Risk Prescription drug management. Decision regarding hospitalization.   Iv ns. Continuous pulse ox and cardiac monitoring. Labs ordered/sent. Imaging ordered.   Differential diagnosis includes dehydration, aki, fracture, ftt, etc. Dispo decision including potential need for admission considered - will get labs and imaging and reassess.   Reviewed nursing notes and prior charts for additional history. External reports reviewed. Additional history from: EMS.   EMS notes concern about ?ability of patient to live/care for self at home.   Cardiac monitor: sinus rhythm, rate 94.   Labs reviewed/interpreted by me - wbc 11, hgb 8 similar to baseline. Aki on ckd, and k is high. D50 iv, albuterol  neb, hco3 iv, insulin  iv, lokelma  dose po.   Xrays reviewed/interpreted by me - no fx.   Hospitalists consulted for admission re weakness, aki on ckd w hyperkalemia, etc.   CRITICAL CARE RE: aki with hyperkalemia, ckd, dehydration, failure to thrive, general weakness.  Performed by: Lucielle Vokes E Emanuelle Bastos Total critical care time: 40 minutes Critical care time was exclusive of separately billable procedures and treating other patients. Critical care was necessary to treat or prevent imminent or life-threatening deterioration. Critical care was time spent personally by me on the following activities: development of treatment plan with patient and/or surrogate as well as nursing, discussions with consultants, evaluation of patient's response to treatment, examination of patient, obtaining history from patient or surrogate, ordering and performing treatments and interventions, ordering and review of laboratory studies, ordering and review of radiographic studies, pulse oximetry and re-evaluation of patient's condition.       Final diagnoses:  None    ED Discharge Orders     None          Bernard Drivers, MD 08/25/24 1406  "

## 2024-08-25 NOTE — ED Triage Notes (Signed)
 Pt from home via [facility].  Discharged from rehab at [facility] on Saturday.  Reports pain all over- worse to bilateral knees and elbows and generalized weakness.  EMS states pt sitting on side of bed on their arrival and concerned that pt is not safe to stay by herself.

## 2024-08-25 NOTE — Hospital Course (Addendum)
 Alison Whitehead  is a 74 y.o Female with extensive history of CKD IIIb, HTN, HLD, anemia of chronic disease, prediabetic, chronic pancreatic insufficiency... Multiple admission for worsening kidney function, failure to thrive, knee pain, confusion was discharged to SNF for rehab home now back again to ED due to progressive generalized weakness, falls, poor p.o. intake.. Denies having any fever or chills nausea or vomiting, or dysuria.  Recent hospitalizations for similar complaint generalized weakness, spontaneous falls, poor p.o. intake, AKIs Recent hospitalization discharge 08/04/24 - 08/09/24 Previous to that 07/12/2024 -07/26/2024  ED Evaluation: Blood pressure 111/71, pulse (!) 103, temperature 99 F (37.2 C), temperature source Oral, resp. rate (!) 21, SpO2 97%. LABs: WBC 11.7, hemoglobin 8.2, potassium 5.7, CO2 18, BUN 63, creatinine 2.59,  Requested for patient to admitted for failure to thrive, progressive generalized weakness, acute on chronic kidney disease, hyperkalemia.

## 2024-08-25 NOTE — ED Notes (Signed)
 CCMD called and verified patient on cardiac telemetry

## 2024-08-25 NOTE — TOC Initial Note (Signed)
 Transition of Care [facility]) - Initial/Assessment Note    Patient Details  Name: Alison Whitehead MRN: [id number] Date of Birth: Oct 03, 1950  Transition of Care [facility]) CM/SW Contact:    Alison Whitehead, LCSWA Phone Number: 08/25/2024, 6:01 PM  Clinical Narrative:                 Alison Whitehead is a 74 yo f who presents to the ED for evaluation of weakness in the setting of several recent hospitalizations and SNF discharges. TOC consult received for potential SNF disposition.   CSW met with patient at bedside. She was fairly lethargic and had difficulty participating in the conversation without falling back asleep, though did appear oriented and answered questions appropriately. She provided verbal permission for CSW to reach out to daughter, Alison. She expressed interest in returning to SNF LOC and continuing to pursue STR. She stated she would be agreeable to returning to [facility], where she has been twice in the last few months.   Brief history provided by Alison. Patient lives in apartment alone. Utilizes [employer] for ambulation. Limited but available support by local family. She had a two week hospital stay in December which required ICU stay and experienced some decompensation as a result. She was discharged to [facility], but left early of her own accord. She went home and had a fall after a few days, returned to the hospital, discharged back to [facility], completed therapy, and eventually returned home on Saturday, Jan 17. Both patient and Alison state patient was doing fairly well at home, was able to ambulate around the home independently with her [employer]. She has had some knee pain and continued weakness and did ultimately fall, resulting in the current presentation.   CSW discussed SNF process with patient and daughter, both aware from previous experience. Discussed that a SW will in contact once patient is moved upstairs. Briefly discussed the possibility of copay days. Current plan is for patient  to return SNF. PT/OT recs are pending. Will hold off on FL2 given current lack of clinical documentation (PT/OT, nursing, etc.).   TOC following.   Expected Discharge Plan: Skilled Nursing Facility Barriers to Discharge: English As A Second Language Teacher, Continued Medical Work up, No SNF bed   Patient Goals and CMS Choice Patient states their goals for this hospitalization and ongoing recovery are:: Discharge to SNF and eventually return home independently CMS Medicare.gov Compare Post Acute Care list provided to:: Patient Choice offered to / list presented to : Patient [place] ownership interest in [facility].provided to:: Patient    Expected Discharge Plan and Services In-house Referral: Clinical Social Work   Post Acute Care Choice: Skilled Nursing Facility Living arrangements for the past 2 months: Apartment, Skilled Nursing Facility                                      Prior Living Arrangements/Services Living arrangements for the past 2 months: Apartment, Skilled Nursing Facility Lives with:: Self, Facility Resident Patient language and need for interpreter reviewed:: Yes Do you feel safe going back to the place where you live?: Yes      Need for Family Participation in Patient Care: Yes (Comment) Care giver support system in place?: Yes (comment)   Criminal Activity/Legal Involvement Pertinent to Current Situation/Hospitalization: No - Comment as needed  Activities of Daily Living      Permission Sought/Granted Permission sought to share information with : Family  Supports Permission granted to share information with : Yes, Verbal Permission Granted  Share Information with NAME: Alison Whitehead (daughter) [phone]           Emotional Assessment Appearance:: Appears stated age Attitude/Demeanor/Rapport: Lethargic Affect (typically observed): Appropriate Orientation: : Oriented to Self, Oriented to Place, Oriented to  Time, Oriented to  Situation Alcohol / Substance Use: Not Applicable Psych Involvement: No (comment)  Admission diagnosis:  Failure to thrive (child) [R62.51] Patient Active Problem List   Diagnosis Date Noted   Failure to thrive (child) 08/25/2024   CKD stage 3b, GFR 30-44 ml/min (HCC) 08/25/2024   Hyperkalemia 08/25/2024   Debility 07/18/2024   Metabolic encephalopathy 07/18/2024   Primary osteoarthritis of right knee 07/18/2024   Total knee replacement status, 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   Rotator cuff syndrome, right 01/11/2020   Labile blood glucose    Small vessel disease, cerebrovascular    Prediabetes    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   Acute cerebrovascular accident (CVA) (HCC) 09/30/2019   Unilateral primary osteoarthritis, left knee 03/17/2019   Unilateral primary osteoarthritis, right knee 03/17/2019   Bilateral primary osteoarthritis of knee 03/10/2019   INSOMNIA, CHRONIC 05/19/2007   SPONDYLOSIS, LUMBAR 05/19/2007   Essential hypertension 05/03/2007   ARTHRITIS, KNEE 05/03/2007   PCP:  Alison Heron Ruth, PA-C [place]:   Alison Whitehead [address] [place] [address] Phone: [phone] Fax: [phone]  [id number] [place], [place] - [address] AT [facility] [address] [place] [address] Phone: [phone] Fax: [phone]     Social Drivers of Health (SDOH) Social History: SDOH Screenings   Food Insecurity: No Food Insecurity (08/04/2024)  Housing: Low Risk (08/04/2024)  Transportation Needs: No Transportation Needs (08/04/2024)  Utilities: Not At Risk (08/04/2024)  Depression (PHQ2-9): Low Risk (07/02/2022)  Social Connections: Unknown (08/04/2024)  Recent Concern: Social Connections - Moderately Isolated (07/18/2024)  Tobacco Use: Medium Risk (08/25/2024)   SDOH Interventions:     Readmission Risk Interventions    12/28/2022    1:24 PM  Readmission Risk Prevention Plan  Post  Dischage Appt Complete  Medication Screening Not Complete  Medication Screening Not Complete Comment N/A  Transportation Screening Not Complete  Transportation Screening Comment N/A

## 2024-08-25 NOTE — H&P (Addendum)
 " History and Physical   Patient: Alison Whitehead                            PCP: [name], PA-C                    DOB: 1950-09-02            DOA: 08/25/2024 FMW:[id number]             DOS: 08/25/2024, 3:36 PM  [name], PA-C  Patient coming from:   HOME  I have personally reviewed patient's medical records, in electronic medical records, including:  [place] link, and care everywhere.    Chief Complaint:   Chief Complaint  Patient presents with   Weakness   Pain    History of present illness:    Alison Whitehead  is a 74 y.o Female with extensive history of CKD IIIb, HTN, HLD, anemia of chronic disease, prediabetic, chronic pancreatic insufficiency... Multiple admission for worsening kidney function, failure to thrive, knee pain, confusion was discharged to SNF for rehab home now back again to ED due to progressive generalized weakness, falls, poor p.o. intake.. Denies having any fever or chills nausea or vomiting, or dysuria.  Recent hospitalizations for similar complaint generalized weakness, spontaneous falls, poor p.o. intake, AKIs Recent hospitalization discharge 08/04/24 - 08/09/24 Previous to that 07/12/2024 -07/26/2024  ED Evaluation: Blood pressure 111/71, pulse (!) 103, temperature 99 F (37.2 C), temperature source Oral, resp. rate (!) 21, SpO2 97%. LABs: WBC 11.7, hemoglobin 8.2, potassium 5.7, CO2 18, BUN 63, creatinine 2.59,  Requested for patient to admitted for failure to thrive, progressive generalized weakness, acute on chronic kidney disease, hyperkalemia.     Patient Denies having: Fever, Chills, Cough, SOB, Chest Pain, Abd pain, N/V/D, headache, dizziness, lightheadedness,  Dysuria, Joint pain, rash, open wounds    Review of Systems: As per HPI, otherwise 10 point review of systems were negative.    ----------------------------------------------------------------------------------------------------------------------  Allergies[1]  Home MEDs:  Prior to Admission medications  Medication Sig Start Date End Date Taking? Authorizing Provider  amLODipine -olmesartan  (AZOR ) 10-40 MG tablet Take 1 tablet by mouth daily.    [provider]  aspirin  EC 81 MG tablet Take 1 tablet (81 mg total) by mouth daily. 12/13/19   Penumalli, Eduard SAUNDERS, MD  atorvastatin  (LIPITOR) 40 MG tablet Take 1 tablet (40 mg total) by mouth daily. 03/01/21     carvedilol  (COREG ) 6.25 MG tablet Take 1 tablet (6.25 mg total) by mouth 2 (two) times daily. 07/26/24   Rashid, Farhan, MD  CREON  36000-114000 units CPEP capsule Take 36,000 Units by mouth in the morning and at bedtime. 01/22/22   [provider]  cyclobenzaprine  (FLEXERIL ) 5 MG tablet TAKE 1 TABLET BY MOUTH THREE TIMES A DAY AS NEEDED FOR MUSCLE SPASMS 05/06/23   Valdemar Rocky SAUNDERS, NP  dapagliflozin  propanediol (FARXIGA ) 10 MG TABS tablet Take 1 tablet (10 mg total) by mouth daily. 11/04/21   Dennise Hoes, MD  dicyclomine  (BENTYL ) 10 MG capsule Take 1-2 capsules (10-20 mg total) by mouth 4 (four) times daily as needed for spasms. 07/26/24   Dino Antu, MD  ferrous sulfate  325 (65 FE) MG tablet Take 325 mg by mouth daily with breakfast. 12/09/22   [provider]  furosemide  (LASIX ) 20 MG tablet Take 20 mg by mouth every morning. 06/30/24   [provider]  HYDROcodone -acetaminophen  (NORCO) 10-325 MG tablet Take  1 tablet by mouth every 6 (six) hours as needed for severe pain (pain score 7-10). 08/08/24   Vernon Ranks, MD  Melatonin 5 MG CAPS Take 5 mg by mouth at bedtime.    [provider]  metFORMIN  (GLUCOPHAGE ) 1000 MG tablet Take 1,000 mg by mouth every evening. 06/15/24   [provider]  naloxone [facility]) nasal spray 4 mg/0.1 mL  04/25/22   [provider]  OZEMPIC , 2 MG/DOSE, 8 MG/3ML SOPN Inject 2 mg into  the skin once a week. 06/22/24   [provider]    PRN MEDs: acetaminophen  **OR** acetaminophen , bisacodyl , hydrALAZINE , HYDROmorphone  (DILAUDID ) injection, ipratropium, ondansetron  **OR** ondansetron  (ZOFRAN ) IV, oxyCODONE , senna-docusate, sodium phosphate , traZODone   Past Medical History:  Diagnosis Date   Allergy    seasonal   Arthritis    CKD (chronic kidney disease)    DDD (degenerative disc disease)    Degenerative disc disease, lumbar    Hypertension    IBS (irritable bowel syndrome)    Osteopenia    Osteoporosis    osteopenia   Stroke (HCC) 09/2019    Past Surgical History:  Procedure Laterality Date   SHOULDER SURGERY Left    lipoma   TOTAL KNEE ARTHROPLASTY Left 12/26/2022   Procedure: LEFT TOTAL KNEE ARTHROPLASTY;  Surgeon: Harden Jerona GAILS, MD;  Location: [facility] OR;  Service: Orthopedics;  Laterality: Left;     reports that she quit smoking about 4 years ago. Her smoking use included cigarettes. She has never used smokeless tobacco. She reports that she does not drink alcohol and does not use drugs.   Family History  Problem Relation Age of Onset   Breast cancer Maternal Aunt    Diabetes Mother    Hypertension Mother    Glaucoma Mother    Other Father        accident   Diabetes Sister    Hypertension Sister    Cancer Sister    Colon cancer Neg Hx    Colon polyps Neg Hx    Esophageal cancer Neg Hx    Stomach cancer Neg Hx    Rectal cancer Neg Hx     Physical Exam:   Vitals:   08/25/24 1044 08/25/24 1529  BP: 111/71   Pulse: (!) 103   Resp: (!) 21   Temp: 99 F (37.2 C) 98.9 F (37.2 C)  TempSrc: Oral Oral  SpO2: 97%    Constitutional: NAD, calm, comfortable Eyes: PERRL, lids and conjunctivae normal ENMT: Mucous membranes are moist. Posterior pharynx clear of any exudate or lesions.Normal dentition.  Neck: normal, supple, no masses, no thyromegaly Respiratory: clear to auscultation bilaterally, no wheezing, no crackles. Normal  respiratory effort. No accessory muscle use.  Cardiovascular: Regular rate and rhythm, no murmurs / rubs / gallops. No extremity edema. 2+ pedal pulses. No carotid bruits.  Abdomen: no tenderness, no masses palpated. No hepatosplenomegaly. Bowel sounds positive.  Musculoskeletal: no clubbing / cyanosis. No joint deformity upper and lower extremities. Good ROM, no contractures. Normal muscle tone.  Neurologic: CN II-XII grossly intact. Sensation intact, DTR normal. Strength 5/5 in all 4.  Psychiatric: Normal judgment and insight. Alert and oriented x 3. Normal mood.  Skin: no rashes, lesions, ulcers. No induration Decubitus/ulcers:  Wounds: per nursing documentation         Labs on admission:    I have personally reviewed following labs and imaging studies  CBC: Recent Labs  Lab 08/25/24 1124  WBC 11.7*  HGB 8.2*  HCT 27.6*  MCV 84.9  PLT 538*   Basic Metabolic Panel: Recent Labs  Lab 08/25/24 1124  NA 136  K 5.7*  CL 102  CO2 18*  GLUCOSE 109*  BUN 63*  CREATININE 2.59*  CALCIUM  9.1   GFR: CrCl cannot be calculated (Unknown ideal weight.). Liver Function Tests: Recent Labs  Lab 08/25/24 1124  AST 16  ALT 23  ALKPHOS 114  BILITOT 0.3  PROT 8.1  ALBUMIN  3.6    Recent Labs  Lab 08/25/24 1409  GLUCAP 97    Urine analysis:    Component Value Date/Time   COLORURINE YELLOW 08/04/2024 1446   APPEARANCEUR HAZY (A) 08/04/2024 1446   LABSPEC 1.024 08/04/2024 1446   PHURINE 5.0 08/04/2024 1446   GLUCOSEU 50 (A) 08/04/2024 1446   HGBUR NEGATIVE 08/04/2024 1446   BILIRUBINUR NEGATIVE 08/04/2024 1446   KETONESUR NEGATIVE 08/04/2024 1446   PROTEINUR NEGATIVE 08/04/2024 1446   NITRITE NEGATIVE 08/04/2024 1446   LEUKOCYTESUR TRACE (A) 08/04/2024 1446    Last A1C:  Lab Results  Component Value Date   HGBA1C 5.8 (H) 07/13/2024     Radiologic Exams on Admission:   DG ELBOW COMPLETE RIGHT (3+VIEW) Result Date: 08/25/2024 CLINICAL DATA:  Right elbow  pain. EXAM: RIGHT ELBOW - COMPLETE 3+ VIEW COMPARISON:  None Available. FINDINGS: There is no evidence of fracture, dislocation, or joint effusion. Degenerative changes are seen along the coronoid process. Mild soft tissue swelling is seen along the right olecranon process. IMPRESSION: Mild degenerative changes and mild posterior soft tissue swelling without an acute osseous abnormality. Electronically Signed   By: Suzen Dials M.D.   On: 08/25/2024 12:32    EKG:   Independently reviewed.  Orders placed or performed during the hospital encounter of 08/25/24   ED EKG   ED EKG   EKG 12-Lead   ---------------------------------------------------------------------------------------------------------------------------------------    Assessment / Plan:   Principal Problem:   Failure to thrive (child) Active Problems:   Debility   CKD stage 3b, GFR 30-44 ml/min (HCC)   Hyperkalemia   Essential hypertension   Prediabetes   ARTHRITIS, KNEE   Right pontine stroke (HCC)   Primary osteoarthritis of right knee   Assessment and Plan: Failure to thrive, with progressive generalized weaknesses, with frequent falls Frequent readmissions - Consulted PT OT - Recent admit and discharge to SNF - Patient likely need longer SNF stay for rehab - Fall precautions - Elbow x-ray negative   hyperkalemia : Serum potassium at 5.7, 5 g of Lokelma  given in ED Will follow closely   AKI on CKD IIIb Baseline Cr 1.3-1.5  Cr 2.59 hold aspirin , furosemide , olmesartan  metformin - Avoid hypotension and nephrotoxic agents.      Elbow pain:  X-ray reviewed, negative for any fractures or deformities -continue as needed analgesics    Chronic right knee osteoarthritis - During last hospitalization, status post arthrocentesis yielding 50 cc of fluid, with negative fluid analysis.  No signs of infection    Anemia of CKD Anemia of chronic disease, due to CKD Stable, obtaining iron studies      Latest Ref Rng & Units 08/25/2024   11:24 AM 08/07/2024    1:14 PM 08/06/2024   10:29 AM  CBC  WBC 4.0 - 10.5 K/uL 11.7  7.9  6.7   Hemoglobin 12.0 - 15.0 g/dL 8.2  8.3  7.8   Hematocrit 36.0 - 46.0 % 27.6  27.9  26.7   Platelets 150 - 400 K/uL 538  287  238  Hypertension Continue: Amlodipine , Coreg ,  Holding Lasix  and olmesartan .   Prediabetic Hemoglobin A1c only 5.8. Monitoring CBC closely   Class 2 obesity Last weight 107.4 kg - Resume Ozempic  at discharge.   Pancreatic insufficiency - Continue Creo  Severe generalized debility/weakness, falls - This time as per patient rolled out of the bed, complaining about right elbow pain, x-ray negative for any fractures - Obtaining PT OT, fall precautions      Consults called: Social worker/TOC/PT/OT -------------------------------------------------------------------------------------------------------------------------------------------- DVT prophylaxis:  heparin  injection 5,000 Units Start: 08/25/24 2200 SCDs Start: 08/25/24 1431   Code Status:   Code Status: Full Code   Admission status: Patient will be admitted as Inpatient, with a greater than 2 midnight length of stay. Level of care: Telemetry   Family Communication:  none at bedside  (The above findings and plan of care has been discussed with patient in detail, the patient expressed understanding and agreement of above plan)  --------------------------------------------------------------------------------------------------------------------------------------------------  Disposition Plan:  Anticipated 1-2 days Status is: Inpatient Remains inpatient appropriate because: N/A correction of electrolytes, IV fluids, fluid balance, PT OT evaluation, and placement     ----------------------------------------------------------------------------------------------------------------------------------------------------  Time spent:  81  Min.  Was spent seeing and  evaluating the patient, reviewing all medical records, drawn plan of care.  SIGNED: Adriana DELENA Grams, MD, FHM. FAAFP. [facility], Pager  (Please use [email or web address] to page/ or secure chat through epic) If 7PM-7AM, please contact night-coverage [email or web address],  08/25/2024, 3:36 PM     [1]  Allergies Allergen Reactions   Penicillins Itching   "

## 2024-08-26 ENCOUNTER — Inpatient Hospital Stay (HOSPITAL_COMMUNITY)

## 2024-08-26 DIAGNOSIS — Z515 Encounter for palliative care: Secondary | ICD-10-CM

## 2024-08-26 DIAGNOSIS — G8929 Other chronic pain: Secondary | ICD-10-CM

## 2024-08-26 DIAGNOSIS — R6251 Failure to thrive (child): Secondary | ICD-10-CM

## 2024-08-26 DIAGNOSIS — R627 Adult failure to thrive: Secondary | ICD-10-CM | POA: Diagnosis not present

## 2024-08-26 LAB — COMPREHENSIVE METABOLIC PANEL WITH GFR
ALT: 19 U/L (ref 0–44)
AST: 19 U/L (ref 15–41)
Albumin: 3.3 g/dL — ABNORMAL LOW (ref 3.5–5.0)
Alkaline Phosphatase: 113 U/L (ref 38–126)
Anion gap: 14 (ref 5–15)
BUN: 48 mg/dL — ABNORMAL HIGH (ref 8–23)
CO2: 17 mmol/L — ABNORMAL LOW (ref 22–32)
Calcium: 9.1 mg/dL (ref 8.9–10.3)
Chloride: 107 mmol/L (ref 98–111)
Creatinine, Ser: 1.62 mg/dL — ABNORMAL HIGH (ref 0.44–1.00)
GFR, Estimated: 33 mL/min — ABNORMAL LOW
Glucose, Bld: 87 mg/dL (ref 70–99)
Potassium: 5.4 mmol/L — ABNORMAL HIGH (ref 3.5–5.1)
Sodium: 138 mmol/L (ref 135–145)
Total Bilirubin: 0.4 mg/dL (ref 0.0–1.2)
Total Protein: 7.6 g/dL (ref 6.5–8.1)

## 2024-08-26 LAB — CBC
HCT: 26.6 % — ABNORMAL LOW (ref 36.0–46.0)
Hemoglobin: 8 g/dL — ABNORMAL LOW (ref 12.0–15.0)
MCH: 25.6 pg — ABNORMAL LOW (ref 26.0–34.0)
MCHC: 30.1 g/dL (ref 30.0–36.0)
MCV: 85.3 fL (ref 80.0–100.0)
Platelets: 512 K/uL — ABNORMAL HIGH (ref 150–400)
RBC: 3.12 MIL/uL — ABNORMAL LOW (ref 3.87–5.11)
RDW: 16.3 % — ABNORMAL HIGH (ref 11.5–15.5)
WBC: 10.8 K/uL — ABNORMAL HIGH (ref 4.0–10.5)
nRBC: 0 % (ref 0.0–0.2)

## 2024-08-26 LAB — APTT: aPTT: 36 s (ref 24–36)

## 2024-08-26 LAB — GLUCOSE, CAPILLARY: Glucose-Capillary: 93 mg/dL (ref 70–99)

## 2024-08-26 MED ORDER — SODIUM ZIRCONIUM CYCLOSILICATE 10 G PO PACK
10.0000 g | PACK | Freq: Once | ORAL | Status: AC
  Administered 2024-08-26: 10 g via ORAL
  Filled 2024-08-26: qty 1

## 2024-08-26 MED ORDER — BACLOFEN 10 MG PO TABS
5.0000 mg | ORAL_TABLET | Freq: Three times a day (TID) | ORAL | Status: DC | PRN
  Administered 2024-08-26 – 2024-09-02 (×10): 5 mg via ORAL
  Filled 2024-08-26 (×10): qty 1

## 2024-08-26 NOTE — Evaluation (Signed)
 Occupational Therapy Evaluation Patient Details Name: Alison Whitehead MRN: [id number] DOB: November 18, 1950 Today's Date: 08/26/2024   History of Present Illness   Pt is a 74 y.o. F who presents 08/25/2024 with progressive weakness, falls, poor p.o. intake. Recent d/c from SNF. PMH includes CKD III, HTN, HLD, prediabetes.     Clinical Impressions Pt seen for OT evaluation, agreeable for visit. RN providing IV dilaudid  for high pain levels in lower back and B knees. Pt is a poor historian, unable to provide accurate PLOF. Reports she was working on walking with RW and needing some assist for ADLs following recent d/c from SNF. Today she was only able to tolerate rolling bilaterally in bed with total A +2, incontinent of urine - total A for hygiene and clothing exchange. Generally weak; impaired cognition. Requires frequent redirection and cues for sequencing. RN updated on pt status.  Pt is currently functioning below baseline and would benefit from ongoing acute OT services to progress towards safe discharge and to facilitate return to prior level of function. Current recommendation is post-acute rehab (< 3 hours/day).     If plan is discharge home, recommend the following:   Two people to help with walking and/or transfers;Two people to help with bathing/dressing/bathroom;Assistance with cooking/housework;Direct supervision/assist for medications management;Direct supervision/assist for financial management;Assist for transportation;Supervision due to cognitive status     Functional Status Assessment   Patient has had a recent decline in their functional status and demonstrates the ability to make significant improvements in function in a reasonable and predictable amount of time.     Equipment Recommendations   Other (comment) (defer to next level of care)     Recommendations for Other Services         Precautions/Restrictions   Precautions Precautions: Fall Required  Braces or Orthoses: Other Brace Other Brace: soft R knee brace Restrictions Weight Bearing Restrictions Per Provider Order: No     Mobility Bed Mobility Overal bed mobility: Needs Assistance Bed Mobility: Rolling Rolling: Total assist, +2 for safety/equipment         General bed mobility comments: rolled bilaterally for linen exchange and peri hygiene, cues to reach for bed rails and for sequencing    Transfers                   General transfer comment: deferred 2/2 high pain levels      Balance                                           ADL either performed or assessed with clinical judgement   ADL Overall ADL's : Needs assistance/impaired Eating/Feeding: Set up;Bed level   Grooming: Minimal assistance;Bed level           Upper Body Dressing : Maximal assistance;Bed level   Lower Body Dressing: Total assistance;Bed level       Toileting- Clothing Manipulation and Hygiene: Total assistance;Bed level Toileting - Clothing Manipulation Details (indicate cue type and reason): pt soiled, urinary incontinence, did not alert RN staff             Vision Baseline Vision/History: 1 Wears glasses Patient Visual Report: No change from baseline       Perception         Praxis         Pertinent Vitals/Pain Pain Assessment Pain Assessment: Faces Faces Pain Scale: Hurts worst Pain Location:  lower back, B knees Pain Descriptors / Indicators: Grimacing, Guarding, Sharp, Constant Pain Intervention(s): Limited activity within patient's tolerance, Monitored during session, Patient requesting pain meds-RN notified (IV dilaudid )     Extremity/Trunk Assessment Upper Extremity Assessment Upper Extremity Assessment: Generalized weakness   Lower Extremity Assessment Lower Extremity Assessment: Defer to PT evaluation   Cervical / Trunk Assessment Cervical / Trunk Assessment: Normal   Communication Communication Communication: No  apparent difficulties   Cognition Arousal: Alert Behavior During Therapy: Restless Cognition: Cognition impaired   Orientation impairments: Place, Situation (did not know exact date, but stated Jan 2026) Awareness: Intellectual awareness impaired, Online awareness impaired Memory impairment (select all impairments): Short-term memory, Working civil service fast streamer, Conservation officer, historic buildings Attention impairment (select first level of impairment): Sustained attention Executive functioning impairment (select all impairments): Initiation, Organization, Sequencing, Problem solving OT - Cognition Comments: pt willing to participate despite high pain levels, reports she knew she voided on herself but did not alert RN staff; needs frequent redirection due to writhing in pain                 Following commands: Impaired Following commands impaired: Follows one step commands with increased time     Cueing  General Comments   Cueing Techniques: Verbal cues;Tactile cues  VSS throughout   Exercises     Shoulder Instructions      Home Living Family/patient expects to be discharged to:: Skilled nursing facility                                 Additional Comments: was at home for a few days since recent d/c from SNF for STR; pt reports she was walking with RW and needing some assistance with ADLs.      Prior Functioning/Environment Prior Level of Function : Patient poor historian/Family not available             Mobility Comments: Pt with recent d/c from SNF; ambulatory with RW, history of falls ADLs Comments: was needing A for ADLs since recent d/c from SNF    OT Problem List: Decreased strength;Decreased knowledge of use of DME or AE;Decreased activity tolerance;Impaired balance (sitting and/or standing);Decreased safety awareness;Decreased cognition;Pain;Obesity   OT Treatment/Interventions: Self-care/ADL training;Therapeutic exercise;Patient/family education;Balance  training;Therapeutic activities;DME and/or AE instruction      OT Goals(Current goals can be found in the care plan section)   Acute Rehab OT Goals Patient Stated Goal: pt did not state OT Goal Formulation: With patient Time For Goal Achievement: 09/09/24 Potential to Achieve Goals: Fair   OT Frequency:  Min 2X/week    Co-evaluation PT/OT/SLP Co-Evaluation/Treatment: Yes Reason for Co-Treatment: Complexity of the patient's impairments (multi-system involvement);For patient/therapist safety PT goals addressed during session: Mobility/safety with mobility OT goals addressed during session: ADL's and self-care      AM-PAC OT 6 Clicks Daily Activity     Outcome Measure Help from another person eating meals?: None Help from another person taking care of personal grooming?: A Little Help from another person toileting, which includes using toliet, bedpan, or urinal?: Total Help from another person bathing (including washing, rinsing, drying)?: A Lot Help from another person to put on and taking off regular upper body clothing?: A Lot Help from another person to put on and taking off regular lower body clothing?: Total 6 Click Score: 13   End of Session Nurse Communication: Mobility status;Patient requests pain meds  Activity Tolerance: Patient limited by pain Patient  left: in bed;with call bell/phone within reach;with bed alarm set;with SCD's reapplied  OT Visit Diagnosis: Unsteadiness on feet (R26.81);Other abnormalities of gait and mobility (R26.89);History of falling (Z91.81);Pain Pain - part of body: Knee (back, B knees)                Time: 9044-8977 OT Time Calculation (min): 27 min Charges:  OT General Charges $OT Visit: 1 Visit OT Evaluation $OT Eval Moderate Complexity: 1 Mod  Nicolette Gieske M. [place], OTR/L [facility] Acute Rehabilitation Services [phone] Secure Chat Preferred  Jodene Polyak 08/26/2024, 12:01 PM

## 2024-08-26 NOTE — Plan of Care (Signed)
   Problem: Coping: Goal: Level of anxiety will decrease Outcome: Progressing   Problem: Pain Managment: Goal: General experience of comfort will improve and/or be controlled Outcome: Progressing   Problem: Safety: Goal: Ability to remain free from injury will improve Outcome: Progressing

## 2024-08-26 NOTE — Progress Notes (Signed)
 This chaplain responded to PMT PA-Josseline's consult for spiritual care in the setting of prayer. The chaplain reviewed the Pt. chart notes and received an update from the Pt. RN-Jason. The chaplain understands the Pt. is experiencing pain and is waiting for a CT.   The Pt. accepted my visit and invitation for prayer. The chaplain held the Pt. hand in a moment of silence and prayer. Family is not at the bedside.  This chaplain is available for F/U spiritual care as needed.  Chaplain Leeroy Hummer [phone]

## 2024-08-26 NOTE — Progress Notes (Signed)
 " PROGRESS NOTE  Alison Whitehead  FMW:[id number] DOB: 07-Oct-1950 DOA: 08/25/2024 PCP: [name], PA-C  Consultants  Brief Narrative: 74 y.o Female with extensive history of CKD IIIb, HTN, HLD, anemia of chronic disease, prediabetic, chronic pancreatic insufficiency... Multiple admission for worsening kidney function, failure to thrive, knee pain, confusion was discharged to SNF for rehab home now back again to ED due to progressive generalized weakness, falls, poor p.o. intake.. Denies having any fever or chills nausea or vomiting, or dysuria.   Recent hospitalizations for similar complaint generalized weakness, spontaneous falls, poor p.o. intake, AKIs Recent hospitalization discharge 08/04/24 - 08/09/24 Previous to that 07/12/2024 -07/26/2024   ED Evaluation: Blood pressure 111/71, pulse (!) 103, temperature 99 F (37.2 C), temperature source Oral, resp. rate (!) 21, SpO2 97%. LABs: WBC 11.7, hemoglobin 8.2, potassium 5.7, CO2 18, BUN 63, creatinine 2.59,   Requested for patient to admitted for failure to thrive, progressive generalized weakness, acute on chronic kidney disease, hyperkalemia.    Assessment & Plan: Assessment and Plan: Failure to thrive, with progressive generalized weaknesses, with frequent falls Frequent readmissions - Consulted PT OT - Recent admit and discharge to SNF - Patient likely need longer SNF stay for rehab - Fall precautions - Elbow x-ray negative - Appreciate palliative input.   Hyperkalemia : Serum potassium at 5.7, 5 g of Lokelma  given in ED Repeat this morning was 5.4, Lokelma  repeated. -Will trend   Severe generalized debility/weakness, falls - This time as per patient rolled out of the bed, complaining about right elbow pain, x-ray negative for any fractures - Obtaining PT OT, fall precautions -Recommending SNF at discharge. - It is unclear reason for her debility.   AKI on CKD IIIb Baseline Cr 1.3-1.5  Cr 2.59 hold aspirin ,  furosemide , olmesartan  metformin - Avoid hypotension and nephrotoxic agents. Creatinine down to 1.6 today.   Low back pain: - Describes his bilateral low back pain. - Treated in house with oxycodone  plus Dilaudid . - Still in pain by examiner this morning. - Adding baclofen  as muscle relaxer.   Elbow pain:  X-ray reviewed, negative for any fractures or deformities -continue as needed analgesics   Chronic right knee osteoarthritis/left knee pain - During last hospitalization, status post arthrocentesis yielding 50 cc of fluid, with negative fluid analysis.  No signs of infection -Now complaining more of left knee pain. - Does have some effusion noted here and some warmth. - Obtaining CT of the knee    Anemia of CKD Anemia of chronic disease, due to CKD Stable, obtaining iron studies   Hypertension Continue: Amlodipine , Coreg ,  Holding Lasix  and olmesartan .   Prediabetic Hemoglobin A1c only 5.8. Monitoring CBC closely   Class 2 obesity Last weight 107.4 kg - Resume Ozempic  at discharge.   Pancreatic insufficiency - Continue Creo     DVT prophylaxis:  heparin  injection 5,000 Units Start: 08/25/24 2200 SCDs Start: 08/25/24 1431  Code Status:   Code Status: Limited: Do not attempt resuscitation (DNR) -DNR-LIMITED -Do Not Intubate/DNI  Level of care: Telemetry Status is: Inpatient   Subjective: Patient lying in bed, complaining of bilateral knee pain and bilateral low back pain.  No fevers or chills.  Denies pain elsewhere  Objective: Vitals:   08/26/24 0800 08/26/24 0959 08/26/24 1102 08/26/24 1420  BP: 118/72  115/76 129/72  Pulse: (!) 116 (!) 119 (!) 117 (!) 121  Resp: 18 20 20 20   Temp:      TempSrc:      SpO2: 96% 99% 91%  93%   No intake or output data in the 24 hours ending 08/26/24 1555 There were no vitals filed for this visit. There is no height or weight on file to calculate BMI.  Gen: 74 y.o. female in no apparent distress.  Nontoxic Pulm:  Non-labored breathing.  Clear to auscultation bilaterally.  CV: Regular rate and rhythm. No murmur, rub, or gallop. No JVD GI: Abdomen soft, non-tender, non-distended Ext: Warm, no deformities.  SCDs in place MSK: With right compression sleeve in place around right knee.  Left knee with well-healed patella scar.  Does have area of scabbing over her scar.  With notable effusion and tenderness to palpation suprapatellar region.  No redness noted.  Some warmth noted. Skin: No rashes, lesions  Neuro: Alert and oriented. No focal neurological deficits. Psych: Calm  Judgement and insight appear normal. Mood & affect appropriate.     I have personally reviewed the following labs and images: CBC: Recent Labs  Lab 08/25/24 1124 08/26/24 0442  WBC 11.7* 10.8*  HGB 8.2* 8.0*  HCT 27.6* 26.6*  MCV 84.9 85.3  PLT 538* 512*   BMP &GFR Recent Labs  Lab 08/25/24 1124 08/25/24 1757 08/26/24 0442  NA 136  --  138  K 5.7*  --  5.4*  CL 102  --  107  CO2 18*  --  17*  GLUCOSE 109*  --  87  BUN 63*  --  48*  CREATININE 2.59*  --  1.62*  CALCIUM  9.1  --  9.1  MG  --  2.5*  --   PHOS  --  3.9  --    CrCl cannot be calculated (Unknown ideal weight.). Liver & Pancreas: Recent Labs  Lab 08/25/24 1124 08/26/24 0442  AST 16 19  ALT 23 19  ALKPHOS 114 113  BILITOT 0.3 0.4  PROT 8.1 7.6  ALBUMIN  3.6 3.3*   No results for input(s): LIPASE, AMYLASE in the last 168 hours. No results for input(s): AMMONIA in the last 168 hours. Diabetic: No results for input(s): HGBA1C in the last 72 hours. Recent Labs  Lab 08/25/24 1409 08/26/24 0910  GLUCAP 97 93   Cardiac Enzymes: No results for input(s): CKTOTAL, CKMB, CKMBINDEX, TROPONINI in the last 168 hours. No results for input(s): PROBNP in the last 8760 hours. Coagulation Profile: No results for input(s): INR, PROTIME in the last 168 hours. Thyroid Function Tests: Recent Labs    08/25/24 1759  TSH 0.795   Lipid  Profile: No results for input(s): CHOL, HDL, LDLCALC, TRIG, CHOLHDL, LDLDIRECT in the last 72 hours. Anemia Panel: Recent Labs    08/25/24 1757 08/25/24 1759  VITAMINB12  --  746  FOLATE  --  >20.0  TIBC 235*  --   IRON <10*  --    Urine analysis:    Component Value Date/Time   COLORURINE YELLOW 08/25/2024 1722   APPEARANCEUR CLEAR 08/25/2024 1722   LABSPEC 1.016 08/25/2024 1722   PHURINE 5.0 08/25/2024 1722   GLUCOSEU 50 (A) 08/25/2024 1722   HGBUR NEGATIVE 08/25/2024 1722   BILIRUBINUR NEGATIVE 08/25/2024 1722   KETONESUR NEGATIVE 08/25/2024 1722   PROTEINUR NEGATIVE 08/25/2024 1722   NITRITE NEGATIVE 08/25/2024 1722   LEUKOCYTESUR SMALL (A) 08/25/2024 1722   Sepsis Labs: Invalid input(s): PROCALCITONIN, LACTICIDVEN  Microbiology: No results found for this or any previous visit (from the past 240 hours).  Radiology Studies: CT KNEE LEFT WO CONTRAST Result Date: 08/26/2024 EXAM: CT LEFT KNEE WITHOUT IV CONTRAST 08/26/2024 12:11:08 PM  TECHNIQUE: Axial images were acquired through the left knee without IV contrast. Reformatted images were reviewed. Automated exposure control, iterative reconstruction, and/or weight based adjustment of the mA/kV was utilized to reduce the radiation dose to as low as reasonably achievable. COMPARISON: Radiographs 03/02/2024. CLINICAL HISTORY: Left knee pain, acute on chronic, effusion on exam, tenderness to palpation. Knee replacement, soft-tissue abnormality suspected. FINDINGS: BONES: Total knee prosthesis in place without definite abnormality. Periprosthetic lucency along the metal/methacrylate or methacrylate-bone interfaces. Resurfaced patellar component noted. No periprosthetic fracture or acute bony findings identified. JOINTS: No dislocation. Moderate knee effusion. Possible synovitis with mildly thickened appearance along the synovium. SOFT TISSUES: Atheromatous vascular calcifications. Mild diffuse regional muscular atrophy.  No significant abnormal subcutaneous fluid collection or appreciable abscess. IMPRESSION: 1. Moderate knee effusion with possible synovitis. 2. No significant abnormal subcutaneous fluid collection or abscess. 3. Total knee arthroplasty, including a resurfaced patellar component, without periprosthetic lucency, periprosthetic fracture, or acute osseous abnormality. 4. Atheromatous vascular calcifications. 5. Mild diffuse regional muscular atrophy. Electronically signed by: Ryan Salvage MD 08/26/2024 12:40 PM EST RP Workstation: [id number]    Scheduled Meds:  heparin   5,000 Units Subcutaneous Q8H   sodium chloride  flush  3 mL Intravenous Q12H   sodium chloride  flush  3 mL Intravenous Q12H   Continuous Infusions:   LOS: 1 day   35 minutes with more than 50% spent in reviewing records, counseling patient/family and coordinating care.  Reyes VEAR Gaw, MD [facility] [email or web address] 08/26/2024, 3:55 PM    "

## 2024-08-26 NOTE — Progress Notes (Signed)
 "                                                                                                                                                                                               Daily Progress Note   Patient Name: Alison Whitehead       Date: 08/26/2024 DOB: 04/23/1951  Age: 74 y.o. MRN#: [id number] Attending Physician: Elpidio Reyes DEL, MD Primary Care Physician: [name], PA-C Admit Date: 08/25/2024  Reason for Consultation/Follow-up: Establishing goals of care  Subjective: Yelling in pain - all over  Length of Stay: 1  Current Medications: Scheduled Meds:   heparin   5,000 Units Subcutaneous Q8H   sodium chloride  flush  3 mL Intravenous Q12H   sodium chloride  flush  3 mL Intravenous Q12H    Continuous Infusions:   PRN Meds: acetaminophen  **OR** acetaminophen , baclofen, bisacodyl , hydrALAZINE , HYDROmorphone  (DILAUDID ) injection, ipratropium, ondansetron  **OR** ondansetron  (ZOFRAN ) IV, oxyCODONE , senna-docusate, sodium phosphate , traZODone   Physical Exam Constitutional:      General: She is not in acute distress.    Appearance: She is ill-appearing.  Pulmonary:     Effort: Pulmonary effort is normal.  Skin:    General: Skin is warm and dry.  Neurological:     Mental Status: She is alert.             Vital Signs: BP 129/72 (BP Location: Left Arm)   Pulse (!) 121   Temp 98.6 F (37 C)   Resp 20   SpO2 93%  SpO2: SpO2: 93 % O2 Device: O2 Device: Nasal Cannula O2 Flow Rate: O2 Flow Rate (L/min): 2 L/min  Intake/output summary: No intake or output data in the 24 hours ending 08/26/24 1557 LBM:   Baseline Weight:   Most recent weight:          Patient Active Problem List   Diagnosis Date Noted   Failure to thrive (child) 08/25/2024   CKD stage 3b, GFR 30-44 ml/min (HCC) 08/25/2024   Hyperkalemia 08/25/2024   Debility 07/18/2024   Metabolic encephalopathy 07/18/2024   Primary osteoarthritis of right knee 07/18/2024   Total  knee replacement status, 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   Rotator cuff syndrome, right 01/11/2020   Labile blood glucose    Small vessel disease, cerebrovascular    Prediabetes    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   Acute cerebrovascular accident (CVA) (HCC) 09/30/2019   Unilateral primary osteoarthritis, left knee 03/17/2019   Unilateral primary osteoarthritis, right knee 03/17/2019   Bilateral primary osteoarthritis of knee 03/10/2019   INSOMNIA, CHRONIC 05/19/2007   SPONDYLOSIS, LUMBAR 05/19/2007   Essential hypertension 05/03/2007  ARTHRITIS, KNEE 05/03/2007    Palliative Care Assessment & Plan   HPI: 74 y.o. female  with past medical history of CKD IIIb, HTN, HLD, anemia of chronic disease, prediabetic, chronic pancreatic insufficiency, chronic knee pain admitted on 08/25/2024 with fall at home, poor oral intake, worsening weakness.    In the ED, patient was found to have AKI on CKD 3B likely due to dehydration, hyperkalemia, elbow pain due to her fall.  X-ray was reviewed, notable for some posterior swelling but no acute fracture.  Creatinine at 2.59, up from 1.26 on 1/4.  Mild leukocytosis and anemia.  She is being admitted for failure to thrive and therapies with anticipation of need to return to SNF/rehab.   Patient has had 2 admissions in the past 6 months for similar presentations, most recently less than 30 days ago.  PMT has been consulted to assist with goals of care conversation.  Assessment: Follow up today per request of Alison Whitehead, palliative provider who saw patient yesterday. Please review her note from 1/22 for detailed discussion of goals of care.  Yesterday Alison Whitehead was unable to get in touch with family - hopeful to speak with them today to share patient's goals of care. Also hope to complete AD.  Unfortunately when I saw patient she was complaining of significant pain - tells me it as all over - had received oxycodone  about 2 hours prior to my visit. Sent message to RN  requested pain medication administration - IV dilaudid  was provided. Unable to continue conversation with patient as it was limited due to her pain. She does tell me to call her daughter Alison Whitehead.  Call to Alison Whitehead - voicemail left with call back number. She did call back but when I returned call, no answer again.   Recommendations/Plan: DNR/DNI per conversation with patient 1/22 Continue current care - note that primary is pursuing imaging of knee r/t ongoing pain If further decline, patient clear she does not want ICU, feeding tube, or HD Continue spiritual care Patient would like to try SNF rehab again Continue pain management per orders - primary added baclofen to prn oxycodone  and dilaudid  - work up for pain continues Will continue attempts to include family in goals of care discussions - still hopeful to address AD   Thank you for allowing the Palliative Medicine Team to assist in the care of this patient.   I personally spent a total of 25 minutes in the care of the patient today including preparing to see the patient, performing a medically appropriate exam/evaluation, counseling and educating, referring and communicating with other health care professionals, and documenting clinical information in the EHR.    *Please note that this is a verbal dictation therefore any spelling or grammatical errors are due to the [employer] One system interpretation.  Tobey Jama Barnacle, DNP, AGNP-C Palliative Medicine Team Team Phone # [phone]  Pager [phone]  "

## 2024-08-26 NOTE — Evaluation (Signed)
 Physical Therapy Evaluation Patient Details Name: Alison Whitehead MRN: [id number] DOB: [birth year] Today's Date: 08/26/2024  History of Present Illness  Pt is a 74 y.o. F who presents 08/25/2024 with progressive weakness, falls, poor p.o. intake. Recent d/c from SNF. PMH includes CKD III, HTN, HLD, prediabetes.  Clinical Impression  Pt admitted with above. Pt premedicated with IV pain medication at beginning of session; HR 119 bpm, SpO2 98% on 2L O2. Pt reports severe low back pain with spasms and bilateral knee pain (RN/MD notified and aware). Pt guided through deep breathing techniques but is internally distracted. Pt requiring +2 assist for rolling to R/L for posterior and anterior peri care and bed linen change. Deferred further mobility due to pt pain levels. Will continue to follow acutely to progress as tolerated. Patient will benefit from continued inpatient follow up therapy, <3 hours/day.      If plan is discharge home, recommend the following: Two people to help with walking and/or transfers;Two people to help with bathing/dressing/bathroom   Can travel by private vehicle   No    Equipment Recommendations Hospital bed;Hoyer lift  Recommendations for Other Services       Functional Status Assessment Patient has had a recent decline in their functional status and/or demonstrates limited ability to make significant improvements in function in a reasonable and predictable amount of time     Precautions / Restrictions Precautions Precautions: Fall Required Braces or Orthoses: Other Brace Other Brace: Soft R knee brace Restrictions Weight Bearing Restrictions Per Provider Order: No      Mobility  Bed Mobility Overal bed mobility: Needs Assistance Bed Mobility: Rolling Rolling: Total assist, +2 for safety/equipment         General bed mobility comments: Max cues for initiating i.e. cervical rotation and reaching with contralateral arm. TotalA for rolling to R/L for  bed linen change and posterior/anterior peri care    Transfers                   General transfer comment: Deferred due to pain    Ambulation/Gait                  Stairs            Wheelchair Mobility     Tilt Bed    Modified Rankin (Stroke Patients Only)       Balance                                             Pertinent Vitals/Pain Pain Assessment Pain Assessment: Faces Faces Pain Scale: Hurts worst Pain Location: low back, bilateral knees Pain Descriptors / Indicators: Grimacing, Guarding, Sharp, Constant Pain Intervention(s): Limited activity within patient's tolerance, Monitored during session, Premedicated before session, Repositioned    Home Living Family/patient expects to be discharged to:: Skilled nursing facility                        Prior Function Prior Level of Function : Patient poor historian/Family not available             Mobility Comments: Pt with recent d/c from SNF; ambulatory with RW, history of falls       Extremity/Trunk Assessment   Upper Extremity Assessment Upper Extremity Assessment: Defer to OT evaluation    Lower Extremity Assessment Lower Extremity Assessment: Generalized weakness  Cervical / Trunk Assessment Cervical / Trunk Assessment: Normal  Communication   Communication Communication: No apparent difficulties    Cognition Arousal: Alert Behavior During Therapy: Restless   PT - Cognitive impairments: No family/caregiver present to determine baseline                       PT - Cognition Comments: Internally distracted by pain Following commands: Impaired Following commands impaired: Follows one step commands with increased time     Cueing Cueing Techniques: Verbal cues, Tactile cues     General Comments      Exercises     Assessment/Plan    PT Assessment Patient needs continued PT services  PT Problem List Decreased strength;Decreased  range of motion;Decreased activity tolerance;Decreased balance;Decreased mobility;Decreased knowledge of use of DME;Pain       PT Treatment Interventions DME instruction;Gait training;Functional mobility training;Therapeutic activities;Therapeutic exercise;Balance training;Neuromuscular re-education;Patient/family education    PT Goals (Current goals can be found in the Care Plan section)  Acute Rehab PT Goals Patient Stated Goal: did not state PT Goal Formulation: With patient Time For Goal Achievement: 09/09/24 Potential to Achieve Goals: Fair    Frequency Min 1X/week     Co-evaluation PT/OT/SLP Co-Evaluation/Treatment: Yes Reason for Co-Treatment: Complexity of the patient's impairments (multi-system involvement);Necessary to address cognition/behavior during functional activity;To address functional/ADL transfers PT goals addressed during session: Mobility/safety with mobility OT goals addressed during session: ADL's and self-care       AM-PAC PT 6 Clicks Mobility  Outcome Measure Help needed turning from your back to your side while in a flat bed without using bedrails?: Total Help needed moving from lying on your back to sitting on the side of a flat bed without using bedrails?: Total Help needed moving to and from a bed to a chair (including a wheelchair)?: Total Help needed standing up from a chair using your arms (e.g., wheelchair or bedside chair)?: Total Help needed to walk in hospital room?: Total Help needed climbing 3-5 steps with a railing? : Total 6 Click Score: 6    End of Session   Activity Tolerance: Patient limited by pain Patient left: in bed;with call bell/phone within reach;with bed alarm set Nurse Communication: Mobility status PT Visit Diagnosis: Muscle weakness (generalized) (M62.81);Pain Pain - Right/Left: Right Pain - part of body: Knee    Time: 9046-8984 PT Time Calculation (min) (ACUTE ONLY): 22 min   Charges:   PT Evaluation $PT Eval  Moderate Complexity: 1 Mod   PT General Charges $$ ACUTE PT VISIT: 1 Visit         Aleck Daring, PT, DPT Acute Rehabilitation Services Office [phone]   Aleck ONEIDA Daring 08/26/2024, 11:29 AM

## 2024-08-27 ENCOUNTER — Inpatient Hospital Stay (HOSPITAL_COMMUNITY)

## 2024-08-27 DIAGNOSIS — R627 Adult failure to thrive: Secondary | ICD-10-CM | POA: Diagnosis not present

## 2024-08-27 LAB — CBC
HCT: 26.6 % — ABNORMAL LOW (ref 36.0–46.0)
Hemoglobin: 8.1 g/dL — ABNORMAL LOW (ref 12.0–15.0)
MCH: 25.6 pg — ABNORMAL LOW (ref 26.0–34.0)
MCHC: 30.5 g/dL (ref 30.0–36.0)
MCV: 83.9 fL (ref 80.0–100.0)
Platelets: 475 10*3/uL — ABNORMAL HIGH (ref 150–400)
RBC: 3.17 MIL/uL — ABNORMAL LOW (ref 3.87–5.11)
RDW: 16.2 % — ABNORMAL HIGH (ref 11.5–15.5)
WBC: 10.1 10*3/uL (ref 4.0–10.5)
nRBC: 0 % (ref 0.0–0.2)

## 2024-08-27 LAB — BASIC METABOLIC PANEL WITH GFR
Anion gap: 13 (ref 5–15)
BUN: 33 mg/dL — ABNORMAL HIGH (ref 8–23)
CO2: 19 mmol/L — ABNORMAL LOW (ref 22–32)
Calcium: 9.1 mg/dL (ref 8.9–10.3)
Chloride: 109 mmol/L (ref 98–111)
Creatinine, Ser: 1.29 mg/dL — ABNORMAL HIGH (ref 0.44–1.00)
GFR, Estimated: 44 mL/min — ABNORMAL LOW
Glucose, Bld: 145 mg/dL — ABNORMAL HIGH (ref 70–99)
Potassium: 4.4 mmol/L (ref 3.5–5.1)
Sodium: 141 mmol/L (ref 135–145)

## 2024-08-27 NOTE — Plan of Care (Signed)

## 2024-08-27 NOTE — Plan of Care (Signed)

## 2024-08-27 NOTE — Progress Notes (Addendum)
 " PROGRESS NOTE  Alison Whitehead  FMW:[id number] DOB: Aug 28, 1950 DOA: 08/25/2024 PCP: [name], PA-C  Consultants  Addendum 08/27/2024 4:56 PM - Spoke with nursing staff who reported patient is complaining of pain and crying out when attempting chest x-ray to try to put a board behind her back.  She also reported that as the day progressed pain became more of an issue again with patient.  No further knee pain today but now just pretty persistent back pain.  Nursing states that she is very stiff and tense when they attempt to roll her.  I am concerned because of how persistent her back pain has been as well as her tachycardia and her new fever last night.  Her obtaining blood cultures as above.  However going to go ahead and order an MRI with and without contrast to ensure there is no evidence of discitis or paraspinal infection due to the degree of pain she has been having   Brief Narrative: 74 y.o Female with extensive history of CKD IIIb, HTN, HLD, anemia of chronic disease, prediabetic, chronic pancreatic insufficiency... Multiple admission for worsening kidney function, failure to thrive, knee pain, confusion was discharged to SNF for rehab home now back again to ED due to progressive generalized weakness, falls, poor p.o. intake.. Denies having any fever or chills nausea or vomiting, or dysuria.   Recent hospitalizations for similar complaint generalized weakness, spontaneous falls, poor p.o. intake, AKIs Recent hospitalization discharge 08/04/24 - 08/09/24 Previous to that 07/12/2024 -07/26/2024.  Requested for patient to admitted for failure to thrive, progressive generalized weakness, acute on chronic kidney disease, hyperkalemia.    Assessment & Plan: Assessment and Plan: Failure to thrive, with progressive generalized weaknesses, with frequent falls - Frequent readmissions.   - Consulted PT OT--> recommending SNF. - Recent admit and discharge to SNF - Patient likely need  longer SNF stay for rehab - Fall precautions - Appreciate palliative input--> unable to reach family.   Hyperkalemia : -Now resolved. - Will trend.  Fever:  - new overnight last night - broadening search for cause of FTT above.  Blood cultures ordered, chest x-ray, urinalysis/urine culture. - Fortunately her prior leukocytosis has resolved.  However has been tachycardic this admission.  As above, broadening search for infection, low threshold to initiate abx though hopeful to draw cultures first.   Severe generalized debility/weakness, falls - This time as per patient rolled out of the bed, complaining about right elbow pain, x-ray negative for any fractures - Obtaining PT OT, fall precautions -Recommending SNF at discharge. - It is unclear reason for her debility, though working diagnosis is multiple hospital readmissions and SNF stays and resultant deconditioning. -Little more disoriented today.  Possibly secondary to amount of sedating medication she has been getting, I have DC'd her IV hydromorphone  to see if this helps.  Also starting workup for fever of unknown origin as above.   AKI on CKD IIIb Baseline Cr 1.3-1.5  Cr 2.59 hold aspirin , furosemide , olmesartan  metformin - Avoid hypotension and nephrotoxic agents. Continues to improve. -Leading to metabolic acidosis.   Low back pain: - Describes his bilateral low back pain. - Treated in house with oxycodone  plus Dilaudid . - Still in pain by examiner this morning. - Adding baclofen  as muscle relaxer. -She was little bit more disoriented this morning.  However not having any pain whatsoever on my exam which was a good sign.  I am going to decrease her IV narcotics to help prevent any further disorientation.   Elbow  pain:  X-ray reviewed, negative for any fractures or deformities -continue as needed analgesics--> no further complaints after admission   Chronic right knee osteoarthritis/left knee pain - During last  hospitalization, status post arthrocentesis yielding 50 cc of fluid, with negative fluid analysis.  No signs of infection -Now complaining more of left knee pain, though better today than it had been - Obtaining CT of the knee--> revealed possible synovitis and effusion.  However today she does not have any left knee pain.   - Will continue to follow    Anemia of CKD Anemia of chronic disease, due to CKD Stable, obtaining iron studies   Hypertension Continue: Amlodipine , Coreg ,  Holding Lasix  and olmesartan .   Prediabetic Hemoglobin A1c only 5.8. Monitoring CBC closely   Class 2 obesity Last weight 107.4 kg - Resume Ozempic  at discharge.   Pancreatic insufficiency - Continue Creon      DVT prophylaxis:  heparin  injection 5,000 Units Start: 08/25/24 2200 SCDs Start: 08/25/24 1431  Code Status:   Code Status: Limited: Do not attempt resuscitation (DNR) -DNR-LIMITED -Do Not Intubate/DNI  Level of care: Telemetry Status is: Inpatient   Subjective: Patient lying in bed, awake but really only oriented to person.  Does report that her pain is much better than it has been.  She denies any chills   Objective: Vitals:   08/27/24 0359 08/27/24 0500 08/27/24 0734 08/27/24 1356  BP: 105/72  132/89 117/78  Pulse: (!) 108   99  Resp: 18     Temp: 99.6 F (37.6 C)  98.7 F (37.1 C) 98.5 F (36.9 C)  TempSrc: Oral  Oral Oral  SpO2: 93%  97% 95%  Weight:  100.7 kg     No intake or output data in the 24 hours ending 08/27/24 1614 Filed Weights   08/27/24 0500  Weight: 100.7 kg   Body mass index is 34.77 kg/m.  Gen: 74 y.o. female in no apparent distress.  Nontoxic Pulm: Non-labored breathing.  Clear to auscultation bilaterally.  CV: Regular rate and rhythm. No murmur, rub, or gallop. No JVD GI: Abdomen soft, non-tender, non-distended Ext: Warm, no deformities.  SCDs in place MSK: With right compression sleeve in place around right knee.  Left knee with well-healed patella  scar.  Does have area of scabbing over her scar.  With notable effusion and tenderness to palpation suprapatellar region.  No redness noted.  Some warmth noted. Skin: No rashes, lesions  Neuro: Alert and oriented. No focal neurological deficits. Psych: Calm  Judgement and insight appear normal. Mood & affect appropriate.     I have personally reviewed the following labs and images: CBC: Recent Labs  Lab 08/25/24 1124 08/26/24 0442 08/27/24 1153  WBC 11.7* 10.8* 10.1  HGB 8.2* 8.0* 8.1*  HCT 27.6* 26.6* 26.6*  MCV 84.9 85.3 83.9  PLT 538* 512* 475*   BMP &GFR Recent Labs  Lab 08/25/24 1124 08/25/24 1757 08/26/24 0442 08/27/24 1153  NA 136  --  138 141  K 5.7*  --  5.4* 4.4  CL 102  --  107 109  CO2 18*  --  17* 19*  GLUCOSE 109*  --  87 145*  BUN 63*  --  48* 33*  CREATININE 2.59*  --  1.62* 1.29*  CALCIUM  9.1  --  9.1 9.1  MG  --  2.5*  --   --   PHOS  --  3.9  --   --    Estimated Creatinine Clearance: 48 mL/min (  A) (by C-G formula based on SCr of 1.29 mg/dL (H)). Liver & Pancreas: Recent Labs  Lab 08/25/24 1124 08/26/24 0442  AST 16 19  ALT 23 19  ALKPHOS 114 113  BILITOT 0.3 0.4  PROT 8.1 7.6  ALBUMIN  3.6 3.3*   No results for input(s): LIPASE, AMYLASE in the last 168 hours. No results for input(s): AMMONIA in the last 168 hours. Diabetic: No results for input(s): HGBA1C in the last 72 hours. Recent Labs  Lab 08/25/24 1409 08/26/24 0910  GLUCAP 97 93   Cardiac Enzymes: No results for input(s): CKTOTAL, CKMB, CKMBINDEX, TROPONINI in the last 168 hours. No results for input(s): PROBNP in the last 8760 hours. Coagulation Profile: No results for input(s): INR, PROTIME in the last 168 hours. Thyroid Function Tests: Recent Labs    08/25/24 1759  TSH 0.795   Lipid Profile: No results for input(s): CHOL, HDL, LDLCALC, TRIG, CHOLHDL, LDLDIRECT in the last 72 hours. Anemia Panel: Recent Labs    08/25/24 1757  08/25/24 1759  VITAMINB12  --  746  FOLATE  --  >20.0  TIBC 235*  --   IRON <10*  --    Urine analysis:    Component Value Date/Time   COLORURINE YELLOW 08/25/2024 1722   APPEARANCEUR CLEAR 08/25/2024 1722   LABSPEC 1.016 08/25/2024 1722   PHURINE 5.0 08/25/2024 1722   GLUCOSEU 50 (A) 08/25/2024 1722   HGBUR NEGATIVE 08/25/2024 1722   BILIRUBINUR NEGATIVE 08/25/2024 1722   KETONESUR NEGATIVE 08/25/2024 1722   PROTEINUR NEGATIVE 08/25/2024 1722   NITRITE NEGATIVE 08/25/2024 1722   LEUKOCYTESUR SMALL (A) 08/25/2024 1722   Sepsis Labs: Invalid input(s): PROCALCITONIN, LACTICIDVEN  Microbiology: No results found for this or any previous visit (from the past 240 hours).  Radiology Studies: No results found.   Scheduled Meds:  heparin   5,000 Units Subcutaneous Q8H   sodium chloride  flush  3 mL Intravenous Q12H   sodium chloride  flush  3 mL Intravenous Q12H   Continuous Infusions:   LOS: 2 days   35 minutes with more than 50% spent in reviewing records, counseling patient/family and coordinating care.  Reyes VEAR Gaw, MD [facility] [email or web address] 08/27/2024, 4:14 PM    "

## 2024-08-27 NOTE — TOC Progression Note (Signed)
 Transition of Care [facility]) - Progression Note    Patient Details  Name: Alison Whitehead MRN: [id number] Date of Birth: [birth year]  [facility]) CM/SW Contact  Lendia Dais, [place] Phone Number: 08/27/2024, 10:26 AM  Clinical Narrative:  Pt is disoriented x2. CSW attempted to call and reach out to pt's daughter [name] twice. CSW left a HIPAA approved VM.  CSW will continue to follow.     Expected Discharge Plan: Skilled Nursing Facility Barriers to Discharge: English As A Second Language Teacher, Continued Medical Work up, No SNF bed               Expected Discharge Plan and Services In-house Referral: Clinical Social Work   Post Acute Care Choice: Skilled Nursing Facility Living arrangements for the past 2 months: Apartment, Skilled Nursing Facility                                       Social Drivers of Health (SDOH) Interventions SDOH Screenings   Food Insecurity: No Food Insecurity (08/04/2024)  Housing: Low Risk (08/04/2024)  Transportation Needs: No Transportation Needs (08/04/2024)  Utilities: Not At Risk (08/04/2024)  Depression (PHQ2-9): Low Risk (07/02/2022)  Social Connections: Unknown (08/04/2024)  Recent Concern: Social Connections - Moderately Isolated (07/18/2024)  Tobacco Use: Medium Risk (08/25/2024)    Readmission Risk Interventions    12/28/2022    1:24 PM  Readmission Risk Prevention Plan  Post Dischage Appt Complete  Medication Screening Not Complete  Medication Screening Not Complete Comment N/A  Transportation Screening Not Complete  Transportation Screening Comment N/A

## 2024-08-27 NOTE — NC FL2 (Signed)
 " [place]  MEDICAID [id number] LEVEL OF CARE FORM     IDENTIFICATION  Patient Name: Alison Whitehead Birthdate: 02/29/1951 Sex: female Admission Date (Current Location): 08/25/2024  [facility] and [place] Number:  Producer, Television/film/video and Address:  [facility] [place], [address] [place], [place] [address]      Provider Number: [id number]  Attending Physician Name and Address:  Elpidio Reyes DEL, MD  Relative Name and Phone Number:  Caldwell,Carolyn  Daughter, Emergency Contact  [phone]    Current Level of Care: Hospital Recommended Level of Care: Skilled Nursing Facility Prior Approval Number:    Date Approved/Denied:   PASRR Number: [id number]  Discharge Plan: SNF    Current Diagnoses: Patient Active Problem List   Diagnosis Date Noted   Failure to thrive in adult 08/26/2024   Chronic knee pain 08/26/2024   Palliative care by specialist 08/26/2024   Failure to thrive (child) 08/25/2024   CKD stage 3b, GFR 30-44 ml/min (HCC) 08/25/2024   Hyperkalemia 08/25/2024   Generalized weakness 07/18/2024   Metabolic encephalopathy 07/18/2024   Primary osteoarthritis of right knee 07/18/2024   Total knee replacement status, 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   Rotator cuff syndrome, right 01/11/2020   Labile blood glucose    Small vessel disease, cerebrovascular    Prediabetes    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   Acute cerebrovascular accident (CVA) (HCC) 09/30/2019   Unilateral primary osteoarthritis, left knee 03/17/2019   Unilateral primary osteoarthritis, right knee 03/17/2019   Bilateral primary osteoarthritis of knee 03/10/2019   INSOMNIA, CHRONIC 05/19/2007   SPONDYLOSIS, LUMBAR 05/19/2007   Essential hypertension 05/03/2007   ARTHRITIS, KNEE 05/03/2007    Orientation RESPIRATION BLADDER Height & Weight     Self  O2 Continent Weight: 222 lb 0.1 oz (100.7 kg) Height:     BEHAVIORAL SYMPTOMS/MOOD NEUROLOGICAL BOWEL NUTRITION STATUS      Continent  Diet  AMBULATORY STATUS COMMUNICATION OF NEEDS Skin    (Ambulation not tested) Verbally Other (Comment) (no data)                       Personal Care Assistance Level of Assistance  Bathing, Feeding, Dressing Bathing Assistance:  (No data input) Feeding assistance: Independent Dressing Assistance: Maximum assistance     Functional Limitations Info  Sight, Hearing, Speech Sight Info: Impaired Hearing Info: Adequate Speech Info: Adequate    SPECIAL CARE FACTORS FREQUENCY  PT (By licensed PT), OT (By licensed OT)     PT Frequency: 5x/wk OT Frequency: 5x/wk            Contractures Contractures Info: Not present    Additional Factors Info  Code Status, Allergies Code Status Info: FULL Allergies Info: Nsaids  Penicillins           Current Medications (08/27/2024):  This is the current hospital active medication list Current Facility-Administered Medications  Medication Dose Route Frequency Provider Last Rate Last Admin   acetaminophen  (TYLENOL ) tablet 650 mg  650 mg Oral Q6H PRN Shahmehdi, Seyed A, MD   650 mg at 08/26/24 1929   Or   acetaminophen  (TYLENOL ) suppository 650 mg  650 mg Rectal Q6H PRN Shahmehdi, Seyed A, MD       baclofen  (LIORESAL ) tablet 5 mg  5 mg Oral TID PRN Walden, Jeffrey H, MD   5 mg at 08/27/24 1055   bisacodyl  (DULCOLAX) EC tablet 5 mg  5 mg Oral Daily PRN Willette Adriana LABOR, MD  heparin  injection 5,000 Units  5,000 Units Subcutaneous Q8H Shahmehdi, Adriana A, MD   5,000 Units at 08/27/24 9486   hydrALAZINE  (APRESOLINE ) injection 10 mg  10 mg Intravenous Q4H PRN Shahmehdi, Seyed A, MD       HYDROmorphone  (DILAUDID ) injection 0.5-1 mg  0.5-1 mg Intravenous Q2H PRN Shahmehdi, Seyed A, MD   1 mg at 08/26/24 2009   ipratropium (ATROVENT ) nebulizer solution 0.5 mg  0.5 mg Nebulization Q6H PRN Shahmehdi, Seyed A, MD       ondansetron  (ZOFRAN ) tablet 4 mg  4 mg Oral Q6H PRN Shahmehdi, Seyed A, MD       Or   ondansetron  (ZOFRAN ) injection 4 mg  4  mg Intravenous Q6H PRN Shahmehdi, Seyed A, MD       oxyCODONE  (Oxy IR/ROXICODONE ) immediate release tablet 5 mg  5 mg Oral Q4H PRN Shahmehdi, Seyed A, MD   5 mg at 08/27/24 0452   senna-docusate (Senokot-S) tablet 1 tablet  1 tablet Oral QHS PRN Shahmehdi, Seyed A, MD       sodium chloride  flush (NS) 0.9 % injection 3 mL  3 mL Intravenous Q12H Shahmehdi, Seyed A, MD   3 mL at 08/26/24 2126   sodium chloride  flush (NS) 0.9 % injection 3 mL  3 mL Intravenous Q12H Shahmehdi, Seyed A, MD   3 mL at 08/27/24 0854   sodium phosphate  (FLEET) enema 1 enema  1 enema Rectal Once PRN Shahmehdi, Seyed A, MD       traZODone  (DESYREL ) tablet 25 mg  25 mg Oral QHS PRN Willette Adriana LABOR, MD         Discharge Medications: Please see discharge summary for a list of discharge medications.  Relevant Imaging Results:  Relevant Lab Results:   Additional Information [id number]  Lendia Dais, LCSWA     "

## 2024-08-27 NOTE — Progress Notes (Signed)
 "                                                                                                                                                                                               Daily Progress Note   Patient Name: Alison Whitehead       Date: 08/27/2024 DOB: 1951-07-04  Age: 74 y.o. MRN#: [id number] Attending Physician: Elpidio Reyes DEL, MD Primary Care Physician: [name], PA-C Admit Date: 08/25/2024  Reason for Consultation/Follow-up: Establishing goals of care  Subjective: sleeping  Length of Stay: 2  Current Medications: Scheduled Meds:   heparin   5,000 Units Subcutaneous Q8H   sodium chloride  flush  3 mL Intravenous Q12H   sodium chloride  flush  3 mL Intravenous Q12H    Continuous Infusions:   PRN Meds: acetaminophen  **OR** acetaminophen , baclofen , bisacodyl , hydrALAZINE , HYDROmorphone  (DILAUDID ) injection, ipratropium, ondansetron  **OR** ondansetron  (ZOFRAN ) IV, oxyCODONE , senna-docusate, sodium phosphate , traZODone   Physical Exam Constitutional:      General: She is not in acute distress.    Appearance: She is ill-appearing.     Comments: Sleeping soundly  Pulmonary:     Effort: Pulmonary effort is normal.  Skin:    General: Skin is warm and dry.             Vital Signs: BP 117/78   Pulse 99   Temp 98.5 F (36.9 C) (Oral)   Resp 18   Wt 100.7 kg   SpO2 95%   BMI 34.77 kg/m  SpO2: SpO2: 95 % O2 Device: O2 Device: Nasal Cannula O2 Flow Rate: O2 Flow Rate (L/min): 2 L/min  Intake/output summary: No intake or output data in the 24 hours ending 08/27/24 1433 LBM:   Baseline Weight: Weight: 100.7 kg Most recent weight: Weight: 100.7 kg        Patient Active Problem List   Diagnosis Date Noted   Failure to thrive in adult 08/26/2024   Chronic knee pain 08/26/2024   Palliative care by specialist 08/26/2024   Failure to thrive (child) 08/25/2024   CKD stage 3b, GFR 30-44 ml/min (HCC) 08/25/2024   Hyperkalemia 08/25/2024    Generalized weakness 07/18/2024   Metabolic encephalopathy 07/18/2024   Primary osteoarthritis of right knee 07/18/2024   Total knee replacement status, 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   Rotator cuff syndrome, right 01/11/2020   Labile blood glucose    Small vessel disease, cerebrovascular    Prediabetes    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   Acute cerebrovascular accident (CVA) (HCC) 09/30/2019   Unilateral primary osteoarthritis, left knee 03/17/2019   Unilateral primary osteoarthritis, right knee 03/17/2019   Bilateral  primary osteoarthritis of knee 03/10/2019   INSOMNIA, CHRONIC 05/19/2007   SPONDYLOSIS, LUMBAR 05/19/2007   Essential hypertension 05/03/2007   ARTHRITIS, KNEE 05/03/2007    Palliative Care Assessment & Plan   HPI: 74 y.o. female  with past medical history of CKD IIIb, HTN, HLD, anemia of chronic disease, prediabetic, chronic pancreatic insufficiency, chronic knee pain admitted on 08/25/2024 with fall at home, poor oral intake, worsening weakness.    In the ED, patient was found to have AKI on CKD 3B likely due to dehydration, hyperkalemia, elbow pain due to her fall.  X-ray was reviewed, notable for some posterior swelling but no acute fracture.  Creatinine at 2.59, up from 1.26 on 1/4.  Mild leukocytosis and anemia.  She is being admitted for failure to thrive and therapies with anticipation of need to return to SNF/rehab.   Patient has had 2 admissions in the past 6 months for similar presentations, most recently less than 30 days ago.  PMT has been consulted to assist with goals of care conversation.  Assessment: Follow up today again with hopes of speaking with family.   Upon visiting patient, no family present. She is sleeping. Yesterday I woke her up when I visited and she immediately began screaming in pain. Today's documentation reveals she has been disoriented so I did not attempt to wake her as conversation would likely not be productive.   Medications reviewed -  acetaminophen  650 mg x2, baclofen  5 mg x2, hydromorphone  1 mg IV x2, oxycodone  5 mg x1 in past 24 hours.   Received voicemail for daughter requested call back. Call to daughter - no answer, left voicemail with number but no return call.    Recommendations/Plan: DNR/DNI per conversation with patient 1/22 Continue current care and pain management If further decline, patient clear she does not want ICU, feeding tube, or HD Continue spiritual care Patient would like to try SNF rehab again Will continue attempts to include family in goals of care discussions - still hopeful to address AD - though at this time patient's mental status does not allow for AD completion Recommend outpatient palliative follow at [facility]   Thank you for allowing the Palliative Medicine Team to assist in the care of this patient.   I personally spent a total of 25 minutes in the care of the patient today including preparing to see the patient, performing a medically appropriate exam/evaluation, counseling and educating, referring and communicating with other health care professionals, and documenting clinical information in the EHR.    *Please note that this is a verbal dictation therefore any spelling or grammatical errors are due to the Dragon [employer] One system interpretation.  Tobey Jama Barnacle, DNP, AGNP-C Palliative Medicine Team Team Phone # [phone]  Pager [phone]  "

## 2024-08-27 NOTE — TOC Progression Note (Signed)
 Transition of Care [facility]) - Progression Note    Patient Details  Name: Alison Whitehead MRN: [id number] Date of Birth: 05-Mar-1951  Transition of [facility]) CM/SW Contact  Lendia Dais, [place] Phone Number: 08/27/2024, 12:33 PM  Clinical Narrative: CSW spoke to pt's daughter via phone. Pt is currently oriented to self only.   CSW informed [name] (daughter) of PT rec's of STR. [name] was agreeable to the pt going to SNF and stated that the pt was discharged from [facility] last Saturday. [name] stated she would be open to the pt return to [facility] but would still like to explore other options.  [name] requested an update from the attending. CSW notified MD.  Referrals have been sent out and bed offers are pending.  CSW will continue to follow.    Expected Discharge Plan: Skilled Nursing Facility Barriers to Discharge: English As A Second Language Teacher, Continued Medical Work up, No SNF bed               Expected Discharge Plan and Services In-house Referral: Clinical Social Work   Post Acute Care Choice: Skilled Nursing Facility Living arrangements for the past 2 months: Apartment, Skilled Nursing Facility                                       Social Drivers of Health (SDOH) Interventions SDOH Screenings   Food Insecurity: Patient Unable To Answer (08/27/2024)  Housing: Low Risk (08/27/2024)  Transportation Needs: No Transportation Needs (08/04/2024)  Utilities: Not At Risk (08/04/2024)  Depression (PHQ2-9): Low Risk (07/02/2022)  Social Connections: Unknown (08/04/2024)  Recent Concern: Social Connections - Moderately Isolated (07/18/2024)  Tobacco Use: Medium Risk (08/25/2024)    Readmission Risk Interventions    12/28/2022    1:24 PM  Readmission Risk Prevention Plan  Post Dischage Appt Complete  Medication Screening Not Complete  Medication Screening Not Complete Comment N/A  Transportation Screening Not Complete  Transportation Screening Comment N/A

## 2024-08-28 ENCOUNTER — Inpatient Hospital Stay (HOSPITAL_COMMUNITY)

## 2024-08-28 DIAGNOSIS — R7881 Bacteremia: Secondary | ICD-10-CM

## 2024-08-28 DIAGNOSIS — I2699 Other pulmonary embolism without acute cor pulmonale: Secondary | ICD-10-CM

## 2024-08-28 DIAGNOSIS — M549 Dorsalgia, unspecified: Secondary | ICD-10-CM

## 2024-08-28 DIAGNOSIS — I82452 Acute embolism and thrombosis of left peroneal vein: Secondary | ICD-10-CM

## 2024-08-28 DIAGNOSIS — M25462 Effusion, left knee: Secondary | ICD-10-CM

## 2024-08-28 DIAGNOSIS — R627 Adult failure to thrive: Secondary | ICD-10-CM | POA: Diagnosis not present

## 2024-08-28 DIAGNOSIS — B9562 Methicillin resistant Staphylococcus aureus infection as the cause of diseases classified elsewhere: Secondary | ICD-10-CM

## 2024-08-28 DIAGNOSIS — M79606 Pain in leg, unspecified: Secondary | ICD-10-CM

## 2024-08-28 LAB — BLOOD CULTURE ID PANEL (REFLEXED) - BCID2

## 2024-08-28 LAB — CBC
HCT: 26.2 % — ABNORMAL LOW (ref 36.0–46.0)
Hemoglobin: 8.1 g/dL — ABNORMAL LOW (ref 12.0–15.0)
MCH: 25.5 pg — ABNORMAL LOW (ref 26.0–34.0)
MCHC: 30.9 g/dL (ref 30.0–36.0)
MCV: 82.4 fL (ref 80.0–100.0)
Platelets: 480 10*3/uL — ABNORMAL HIGH (ref 150–400)
RBC: 3.18 MIL/uL — ABNORMAL LOW (ref 3.87–5.11)
RDW: 16.4 % — ABNORMAL HIGH (ref 11.5–15.5)
WBC: 10.3 10*3/uL (ref 4.0–10.5)
nRBC: 0.2 % (ref 0.0–0.2)

## 2024-08-28 LAB — COMPREHENSIVE METABOLIC PANEL WITH GFR
ALT: 66 U/L — ABNORMAL HIGH (ref 0–44)
AST: 82 U/L — ABNORMAL HIGH (ref 15–41)
Albumin: 2.9 g/dL — ABNORMAL LOW (ref 3.5–5.0)
Alkaline Phosphatase: 175 U/L — ABNORMAL HIGH (ref 38–126)
Anion gap: 12 (ref 5–15)
BUN: 31 mg/dL — ABNORMAL HIGH (ref 8–23)
CO2: 20 mmol/L — ABNORMAL LOW (ref 22–32)
Calcium: 9.2 mg/dL (ref 8.9–10.3)
Chloride: 108 mmol/L (ref 98–111)
Creatinine, Ser: 1.2 mg/dL — ABNORMAL HIGH (ref 0.44–1.00)
GFR, Estimated: 48 mL/min — ABNORMAL LOW
Glucose, Bld: 103 mg/dL — ABNORMAL HIGH (ref 70–99)
Potassium: 4.3 mmol/L (ref 3.5–5.1)
Sodium: 140 mmol/L (ref 135–145)
Total Bilirubin: 0.3 mg/dL (ref 0.0–1.2)
Total Protein: 7.2 g/dL (ref 6.5–8.1)

## 2024-08-28 LAB — GLUCOSE, CAPILLARY: Glucose-Capillary: 95 mg/dL (ref 70–99)

## 2024-08-28 LAB — AMMONIA: Ammonia: 16 umol/L (ref 9–35)

## 2024-08-28 MED ORDER — VANCOMYCIN HCL IN DEXTROSE 1-5 GM/200ML-% IV SOLN
1000.0000 mg | INTRAVENOUS | Status: DC
  Administered 2024-08-29: 1000 mg via INTRAVENOUS
  Filled 2024-08-28 (×2): qty 200

## 2024-08-28 MED ORDER — MORPHINE SULFATE (PF) 2 MG/ML IV SOLN: 1.0000 mg | Freq: Once | INTRAVENOUS | Status: DC

## 2024-08-28 MED ORDER — MORPHINE SULFATE (PF) 2 MG/ML IV SOLN
2.0000 mg | INTRAVENOUS | Status: DC | PRN
  Administered 2024-08-28: 2 mg via INTRAVENOUS
  Filled 2024-08-28: qty 1

## 2024-08-28 MED ORDER — LORAZEPAM 2 MG/ML IJ SOLN
0.5000 mg | Freq: Once | INTRAMUSCULAR | Status: AC
  Administered 2024-08-28: 0.5 mg via INTRAVENOUS
  Filled 2024-08-28: qty 1

## 2024-08-28 MED ORDER — SODIUM CHLORIDE 0.9 % IV SOLN
2.0000 g | Freq: Two times a day (BID) | INTRAVENOUS | Status: DC
  Administered 2024-08-28: 2 g via INTRAVENOUS
  Filled 2024-08-28: qty 12.5

## 2024-08-28 MED ORDER — ENOXAPARIN SODIUM 100 MG/ML IJ SOSY
100.0000 mg | PREFILLED_SYRINGE | Freq: Two times a day (BID) | INTRAMUSCULAR | Status: DC
  Administered 2024-08-28 – 2024-08-30 (×6): 100 mg via SUBCUTANEOUS
  Filled 2024-08-28 (×7): qty 1

## 2024-08-28 MED ORDER — HYDROMORPHONE HCL 1 MG/ML IJ SOLN
0.5000 mg | INTRAMUSCULAR | Status: DC | PRN
  Administered 2024-08-28: 1 mg via INTRAVENOUS
  Administered 2024-08-28 (×2): 0.5 mg via INTRAVENOUS
  Administered 2024-08-29 – 2024-09-02 (×14): 1 mg via INTRAVENOUS
  Filled 2024-08-28 (×17): qty 1

## 2024-08-28 MED ORDER — VANCOMYCIN HCL 2000 MG/400ML IV SOLN
2000.0000 mg | Freq: Once | INTRAVENOUS | Status: AC
  Administered 2024-08-28: 2000 mg via INTRAVENOUS
  Filled 2024-08-28: qty 400

## 2024-08-28 MED ORDER — IOHEXOL 350 MG/ML SOLN
75.0000 mL | Freq: Once | INTRAVENOUS | Status: AC | PRN
  Administered 2024-08-28: 75 mL via INTRAVENOUS

## 2024-08-28 NOTE — Progress Notes (Addendum)
 " PROGRESS NOTE  Alison Whitehead  FMW:[id number] DOB: 02/09/1951 DOA: 08/25/2024 PCP: [name], PA-C  Consultants  Brief Narrative: 74 y.o Female with extensive history of CKD IIIb, HTN, HLD, anemia of chronic disease, prediabetic, chronic pancreatic insufficiency... Multiple admission for worsening kidney function, failure to thrive, knee pain, confusion was discharged to SNF for rehab home now back again to ED due to progressive generalized weakness, falls, poor p.o. intake.. Denies having any fever or chills nausea or vomiting, or dysuria.   Recent hospitalizations for similar complaint generalized weakness, spontaneous falls, poor p.o. intake, AKIs Recent hospitalization discharge 08/04/24 - 08/09/24 Previous to that 07/12/2024 -07/26/2024.  Requested for patient to admitted for failure to thrive, progressive generalized weakness, acute on chronic kidney disease, hyperkalemia.   1/25:  Found to have acute DVT left peroneal vein plus acute intramuscular thrombosis involving the left soleal veins as well.   Assessment & Plan: Assessment and Plan: DVT:   - new problem, diagnosed today.  Has been complaining of Left knee and calf pain for a couple of weeks, she's been on PPX heparin  while in house so it's still possible though less likely DVT happened while here.  I wonder if this has been the cause of her leg pain that she had while at home.    - This AM, was complaining of abdominal pain (as well as back pain) so I ordered CT abdomen/pelvis.  Revealed fatty liver, diverticulosis, no acute intra-abdominal or pelvic pathology-->and possible DVT and recommended follow-up with duplex ultrasound. - Duplex ultrasound showed results above, acute DVT left lower extremity.  Started on full dose anticoagulation. - Sent for CT chest in light of tachycardia and new oxygen requirement this admission, concern for PE  Positive blood cultures: - ordered after she spike a fever 1/23 - 2/3  bottles now reportedly growing MRSA - starting vanco, ID autoconsulted - I'm worried that she's possible seeded her lumbar discs due to degree of her back pain.  Await ID consult but would favor MRI of lumbar spine (ordered yesterday, was canceled)  Fever:  - new 1/23 evening - reason for initial blood cultures.  CXR also ordered, concern for PNA.   - CTA ordered, which would help clarify whether PNA actually exists - currently on cefepime  for concern for HAP -- await CTA and stop cefepime  if no evidence of PNA.   - no leukocytosis  Low back pain: - Subjectively, her main issue.  Describes exquisite back pain with spasms. - Was being controlled on Dilaudid , switched to morphine  but this did not control pain and she is being switched back to Dilaudid . - Also on baclofen . - As above concern for possible seeding with MRSA bacteremia.  Will await ID input but favor MRI of low back with contrast to assess.  Failure to thrive, with progressive generalized weaknesses, with frequent falls - Frequent readmissions.   - Consulted PT OT--> recommending SNF. - Recent admit and discharge to SNF - Patient likely need longer SNF stay for rehab - Fall precautions - Appreciate palliative input--> unable to reach family.   Hyperkalemia : -Now resolved. - Will trend.   AKI on CKD IIIb Baseline Cr 1.3-1.5  Cr 2.59 hold aspirin , furosemide , olmesartan  metformin - Avoid hypotension and nephrotoxic agents. Continues to improve. -Leading to metabolic acidosis.  Chronic right knee osteoarthritis/left knee pain - During last hospitalization, status post arthrocentesis yielding 50 cc of fluid, with negative fluid analysis.  No signs of infection -Now complaining more of left knee  pain, though better today than it had been - Obtaining CT of the knee--> revealed possible synovitis and effusion.  However today she does not have any left knee pain.   - Not notably warm on my exam and remains without redness.   It is possible that MRSA came from here/seeded to knee   This seems little less likely.  May need knee arthrocentesis. -Leg pain likely from acute DVT.    Anemia of CKD Anemia of chronic disease, due to CKD Stable, obtaining iron studies   Hypertension Continue: Amlodipine , Coreg ,  Holding Lasix  and olmesartan .   Prediabetic Hemoglobin A1c only 5.8. Monitoring CBC closely   Class 2 obesity Last weight 107.4 kg - Resume Ozempic  at discharge.   Pancreatic insufficiency - Continue Creon      DVT prophylaxis:  SCDs Start: 08/25/24 1431  Code Status:   Code Status: Limited: Do not attempt resuscitation (DNR) -DNR-LIMITED -Do Not Intubate/DNI  Level of care: Telemetry Status is: Inpatient   Subjective: Patient lying in bed, in pain.  Crying out.  Describes mostly low back pain.  Spasms that occur suddenly.  Not really hungry nor eating today.  She denies any subjective fevers or chills.  Objective: Vitals:   08/27/24 1940 08/28/24 0444 08/28/24 0500 08/28/24 0727  BP: 125/77 (!) 135/91  (!) 140/88  Pulse: 94 (!) 105  (!) 104  Resp: 18 20  16   Temp: 98.2 F (36.8 C) (!) 97.3 F (36.3 C)  98.4 F (36.9 C)  TempSrc: Oral Oral  Oral  SpO2: 95% 98%  100%  Weight:   102.6 kg     Intake/Output Summary (Last 24 hours) at 08/28/2024 1352 Last data filed at 08/28/2024 0444 Gross per 24 hour  Intake --  Output 600 ml  Net -600 ml   Filed Weights   08/27/24 0500 08/28/24 0500  Weight: 100.7 kg 102.6 kg   Body mass index is 35.43 kg/m.  Gen: 74 y.o. female in no apparent distress.  Nontoxic in mild distress due to back pain.  Nasal cannula in place. Pulm: Non-labored breathing.  Clear to auscultation bilaterally.  CV: Tachycardic with regular rhythm GI: Abdomen soft, mildly distended, TTP suprapubic with mild guarding Ext: Warm, no deformities.  SCDs in place MSK: With right compression sleeve in place around right knee.  Left knee with well-healed patella scar.  Does  have area of scabbing over her scar.  With notable effusion and tenderness to palpation suprapatellar region.  No redness or warmth noted today. Skin: No rashes, lesions  Neuro: Alert and oriented to person and place.  Does not know why she is in the hospital. No focal neurological deficits. Psych: Anxious appearing, crying out.   I have personally reviewed the following labs and images: CBC: Recent Labs  Lab 08/25/24 1124 08/26/24 0442 08/27/24 1153 08/28/24 0439  WBC 11.7* 10.8* 10.1 10.3  HGB 8.2* 8.0* 8.1* 8.1*  HCT 27.6* 26.6* 26.6* 26.2*  MCV 84.9 85.3 83.9 82.4  PLT 538* 512* 475* 480*   BMP &GFR Recent Labs  Lab 08/25/24 1124 08/25/24 1757 08/26/24 0442 08/27/24 1153 08/28/24 0439  NA 136  --  138 141 140  K 5.7*  --  5.4* 4.4 4.3  CL 102  --  107 109 108  CO2 18*  --  17* 19* 20*  GLUCOSE 109*  --  87 145* 103*  BUN 63*  --  48* 33* 31*  CREATININE 2.59*  --  1.62* 1.29* 1.20*  CALCIUM   9.1  --  9.1 9.1 9.2  MG  --  2.5*  --   --   --   PHOS  --  3.9  --   --   --    Estimated Creatinine Clearance: 52.2 mL/min (A) (by C-G formula based on SCr of 1.2 mg/dL (H)). Liver & Pancreas: Recent Labs  Lab 08/25/24 1124 08/26/24 0442 08/28/24 0439  AST 16 19 82*  ALT 23 19 66*  ALKPHOS 114 113 175*  BILITOT 0.3 0.4 0.3  PROT 8.1 7.6 7.2  ALBUMIN  3.6 3.3* 2.9*   No results for input(s): LIPASE, AMYLASE in the last 168 hours. Recent Labs  Lab 08/28/24 1105  AMMONIA 16   Diabetic: No results for input(s): HGBA1C in the last 72 hours. Recent Labs  Lab 08/25/24 1409 08/26/24 0910 08/28/24 0744  GLUCAP 97 93 95   Cardiac Enzymes: No results for input(s): CKTOTAL, CKMB, CKMBINDEX, TROPONINI in the last 168 hours. No results for input(s): PROBNP in the last 8760 hours. Coagulation Profile: No results for input(s): INR, PROTIME in the last 168 hours. Thyroid Function Tests: Recent Labs    08/25/24 1759  TSH 0.795   Lipid  Profile: No results for input(s): CHOL, HDL, LDLCALC, TRIG, CHOLHDL, LDLDIRECT in the last 72 hours. Anemia Panel: Recent Labs    08/25/24 1757 08/25/24 1759  VITAMINB12  --  746  FOLATE  --  >20.0  TIBC 235*  --   IRON <10*  --    Urine analysis:    Component Value Date/Time   COLORURINE YELLOW 08/25/2024 1722   APPEARANCEUR CLEAR 08/25/2024 1722   LABSPEC 1.016 08/25/2024 1722   PHURINE 5.0 08/25/2024 1722   GLUCOSEU 50 (A) 08/25/2024 1722   HGBUR NEGATIVE 08/25/2024 1722   BILIRUBINUR NEGATIVE 08/25/2024 1722   KETONESUR NEGATIVE 08/25/2024 1722   PROTEINUR NEGATIVE 08/25/2024 1722   NITRITE NEGATIVE 08/25/2024 1722   LEUKOCYTESUR SMALL (A) 08/25/2024 1722   Sepsis Labs: Invalid input(s): PROCALCITONIN, LACTICIDVEN  Microbiology: Recent Results (from the past 240 hours)  Culture, blood (Routine X 2) w Reflex to ID Panel     Status: None (Preliminary result)   Collection Time: 08/27/24 10:02 PM   Specimen: BLOOD LEFT HAND  Result Value Ref Range Status   Specimen Description BLOOD LEFT HAND  Final   Special Requests   Final    BOTTLES DRAWN AEROBIC AND ANAEROBIC Blood Culture adequate volume   Culture  Setup Time   Final    GRAM POSITIVE COCCI IN BOTH AEROBIC AND ANAEROBIC BOTTLES CRITICAL RESULT CALLED TO, READ BACK BY AND VERIFIED WITH: PHARMD T. DANG [id number] AT 1345, ADC Performed at [facility], [address] [place], [place] [address]    Culture GRAM POSITIVE COCCI  Final   Report Status PENDING  Incomplete  Blood Culture ID Panel (Reflexed)     Status: Abnormal   Collection Time: 08/27/24 10:02 PM  Result Value Ref Range Status   Enterococcus faecalis NOT DETECTED NOT DETECTED Final   Enterococcus Faecium NOT DETECTED NOT DETECTED Final   Listeria monocytogenes NOT DETECTED NOT DETECTED Final   Staphylococcus species DETECTED (A) NOT DETECTED Final    Comment: CRITICAL RESULT CALLED TO, READ BACK BY AND VERIFIED WITH: PHARMD T.  DANG [id number] AT 1345, ADC    Staphylococcus aureus (BCID) DETECTED (A) NOT DETECTED Final    Comment: Methicillin (oxacillin)-resistant Staphylococcus aureus (MRSA). MRSA is predictably resistant to beta-lactam antibiotics (except ceftaroline). Preferred therapy is vancomycin   unless clinically contraindicated. Patient requires contact precautions if  hospitalized. CRITICAL RESULT CALLED TO, READ BACK BY AND VERIFIED WITH: PHARMD T. DANG [id number] AT 1345, ADC    Staphylococcus epidermidis NOT DETECTED NOT DETECTED Final   Staphylococcus lugdunensis NOT DETECTED NOT DETECTED Final   Streptococcus species NOT DETECTED NOT DETECTED Final   Streptococcus agalactiae NOT DETECTED NOT DETECTED Final   Streptococcus pneumoniae NOT DETECTED NOT DETECTED Final   Streptococcus pyogenes NOT DETECTED NOT DETECTED Final   A.calcoaceticus-baumannii NOT DETECTED NOT DETECTED Final   Bacteroides fragilis NOT DETECTED NOT DETECTED Final   Enterobacterales NOT DETECTED NOT DETECTED Final   Enterobacter cloacae complex NOT DETECTED NOT DETECTED Final   Escherichia coli NOT DETECTED NOT DETECTED Final   Klebsiella aerogenes NOT DETECTED NOT DETECTED Final   Klebsiella oxytoca NOT DETECTED NOT DETECTED Final   Klebsiella pneumoniae NOT DETECTED NOT DETECTED Final   Proteus species NOT DETECTED NOT DETECTED Final   Salmonella species NOT DETECTED NOT DETECTED Final   Serratia marcescens NOT DETECTED NOT DETECTED Final   Haemophilus influenzae NOT DETECTED NOT DETECTED Final   Neisseria meningitidis NOT DETECTED NOT DETECTED Final   Pseudomonas aeruginosa NOT DETECTED NOT DETECTED Final   Stenotrophomonas maltophilia NOT DETECTED NOT DETECTED Final   Candida albicans NOT DETECTED NOT DETECTED Final   Candida auris NOT DETECTED NOT DETECTED Final   Candida glabrata NOT DETECTED NOT DETECTED Final   Candida krusei NOT DETECTED NOT DETECTED Final   Candida parapsilosis NOT DETECTED NOT DETECTED Final   Candida  tropicalis NOT DETECTED NOT DETECTED Final   Cryptococcus neoformans/gattii NOT DETECTED NOT DETECTED Final   Meth resistant mecA/C and MREJ DETECTED (A) NOT DETECTED Final    Comment: CRITICAL RESULT CALLED TO, READ BACK BY AND VERIFIED WITH: PHARMD T. DANG [id number] AT 1345, ADC Performed at [facility], [address], [place], [place] [address]   Culture, blood (Routine X 2) w Reflex to ID Panel     Status: None (Preliminary result)   Collection Time: 08/27/24 10:03 PM   Specimen: BLOOD LEFT ARM  Result Value Ref Range Status   Specimen Description BLOOD LEFT ARM  Final   Special Requests   Final    BOTTLES DRAWN AEROBIC AND ANAEROBIC Blood Culture adequate volume   Culture  Setup Time   Final    GRAM POSITIVE COCCI IN CLUSTERS AEROBIC BOTTLE ONLY CRITICAL VALUE NOTED.  VALUE IS CONSISTENT WITH PREVIOUSLY REPORTED AND CALLED VALUE. Performed at [facility], [address], [place], [place] [address]    Culture [facility] POSITIVE COCCI  Final   Report Status PENDING  Incomplete    Radiology Studies: VAS US  LOWER EXTREMITY VENOUS (DVT) Result Date: 08/28/2024  Lower Venous DVT Study Patient Name:  [facility]  Date of Exam:   08/28/2024 Medical Rec #: [id number]             Accession #:    [id number] Date of Birth: May 17, 1951            Patient Gender: F Patient Age:   4 years Exam Location:  [facility] Procedure:      VAS US  LOWER EXTREMITY VENOUS (DVT) Referring Phys: Kashawna Manzer --------------------------------------------------------------------------------  Indications: Possible DVT seen on CT abd/pelvis.  Limitations: Poor ultrasound/tissue interface. Comparison Study: No previous exams Performing Technologist: Jody Hill RVT, RDMS  Examination Guidelines: A complete evaluation includes B-mode imaging, spectral Doppler, color Doppler, and power Doppler as needed of all accessible  portions of each vessel. Bilateral testing is considered an integral part of a  complete examination. Limited examinations for reoccurring indications may be performed as noted. The reflux portion of the exam is performed with the patient in reverse Trendelenburg.  +---------+---------------+---------+-----------+-------------+--------------+ RIGHT    CompressibilityPhasicitySpontaneityProperties   Thrombus Aging +---------+---------------+---------+-----------+-------------+--------------+ CFV      Full           Yes      Yes        rouleaux flow               +---------+---------------+---------+-----------+-------------+--------------+ SFJ      Full                               rouleaux flow               +---------+---------------+---------+-----------+-------------+--------------+ FV Prox  Full           Yes      Yes                                    +---------+---------------+---------+-----------+-------------+--------------+ FV Mid   Full           Yes      Yes                                    +---------+---------------+---------+-----------+-------------+--------------+ FV DistalFull           Yes      Yes                                    +---------+---------------+---------+-----------+-------------+--------------+ PFV      Full                                                           +---------+---------------+---------+-----------+-------------+--------------+ POP      Full           Yes      Yes                                    +---------+---------------+---------+-----------+-------------+--------------+ PTV      Full                                                           +---------+---------------+---------+-----------+-------------+--------------+ PERO     Full                                                           +---------+---------------+---------+-----------+-------------+--------------+   +---------+---------------+---------+-----------+-------------+--------------+ LEFT      CompressibilityPhasicitySpontaneityProperties   Thrombus Aging +---------+---------------+---------+-----------+-------------+--------------+ CFV  Full           Yes      Yes        rouleaux flow               +---------+---------------+---------+-----------+-------------+--------------+ SFJ      Full                               rouleaux flow               +---------+---------------+---------+-----------+-------------+--------------+ FV Prox  Full           Yes      Yes                                    +---------+---------------+---------+-----------+-------------+--------------+ FV Mid   Full           Yes      Yes                                    +---------+---------------+---------+-----------+-------------+--------------+ FV DistalFull           Yes      Yes                                    +---------+---------------+---------+-----------+-------------+--------------+ PFV      Full                                                           +---------+---------------+---------+-----------+-------------+--------------+ POP      Full                                                           +---------+---------------+---------+-----------+-------------+--------------+ PTV      Full                                                           +---------+---------------+---------+-----------+-------------+--------------+ PERO     None           No       No                      Acute          +---------+---------------+---------+-----------+-------------+--------------+ Soleal   None           No       No                      Acute          +---------+---------------+---------+-----------+-------------+--------------+    Summary: BILATERAL: -No evidence of popliteal cyst, bilaterally. RIGHT: - There is no evidence of deep vein thrombosis in the lower extremity.  LEFT: - Findings consistent with acute deep vein thrombosis involving the  left peroneal veins. Findings consistent with acute intramuscular thrombosis involving the left soleal veins.  *See table(s) above for measurements and observations.    Preliminary    CT ABDOMEN PELVIS W CONTRAST Result Date: 08/28/2024 CLINICAL DATA:  Abdominal pain. EXAM: CT ABDOMEN AND PELVIS WITH CONTRAST TECHNIQUE: Multidetector CT imaging of the abdomen and pelvis was performed using the standard protocol following bolus administration of intravenous contrast. RADIATION DOSE REDUCTION: This exam was performed according to the departmental dose-optimization program which includes automated exposure control, adjustment of the mA and/or kV according to patient size and/or use of iterative reconstruction technique. CONTRAST:  75mL OMNIPAQUE  IOHEXOL  350 MG/ML SOLN COMPARISON:  CT abdomen pelvis dated 07/23/2024. FINDINGS: Evaluation of this exam is limited due to respiratory motion. Lower chest: No acute abnormality. No intra-abdominal free air or free fluid. Hepatobiliary: Fatty liver. No biliary dilatation. The gallbladder is unremarkable Pancreas: Unremarkable. No pancreatic ductal dilatation or surrounding inflammatory changes. Spleen: Normal in size without focal abnormality. Adrenals/Urinary Tract: The adrenal glands are unremarkable. Small right renal cyst. There is no hydronephrosis on either side. There is symmetric enhancement and excretion of contrast by both kidneys. The visualized ureters and urinary bladder appear unremarkable. Stomach/Bowel: There is moderate distal colonic diverticulosis. There is no bowel obstruction or active inflammation. The appendix is normal. Vascular/Lymphatic: Moderate aortoiliac atherosclerotic disease. The IVC is unremarkable. Faint hypodense focus in the right, femoral vein at the junction of the deep femoral and femoral veins (92/3) suspicious for DVT. Further evaluation with duplex ultrasound recommended. Slight decreased contrast density in the left deep femoral  vein (91/3) may be artifactual or represent DVT. No portal venous gas. There is no adenopathy. Reproductive: Multiple calcified uterine fibroids. No suspicious adnexal masses. Other: Small fat containing right inguinal hernia. Musculoskeletal: Osteopenia with degenerative changes. Grade 1 L4-L5 anterolisthesis. No acute osseous pathology. IMPRESSION: 1. No acute intra-abdominal or pelvic pathology. 2. Colonic diverticulosis. No bowel obstruction. Normal appendix. 3. Fatty liver. 4. Findings concerning for right common femoral vein and possibly left deep femoral vein DVT. Further evaluation with duplex ultrasound recommended. 5.  Aortic Atherosclerosis (ICD10-I70.0). These results will be called to the ordering clinician or representative by the Radiologist Assistant, and communication documented in the PACS or [employer]. Electronically Signed   By: Vanetta Chou M.D.   On: 08/28/2024 10:12   DG Chest Port 1 View Result Date: 08/27/2024 EXAM: 1 VIEW(S) XRAY OF THE CHEST 08/27/2024 04:58:00 PM COMPARISON: 07/12/2024 CLINICAL HISTORY: Fever. FINDINGS: LUNGS AND PLEURA: Patchy right lower lobe airspace opacity. No pleural effusion. No pneumothorax. HEART AND MEDIASTINUM: Cardiomegaly. Atherosclerotic calcifications. BONES AND SOFT TISSUES: No acute osseous abnormality. IMPRESSION: 1. Patchy right lower lobe airspace opacity, suspicious for pneumonia. Electronically signed by: Oneil Devonshire MD 08/27/2024 05:41 PM EST RP Workstation: [id number]     Scheduled Meds:  enoxaparin  (LOVENOX ) injection  100 mg Subcutaneous BID   sodium chloride  flush  3 mL Intravenous Q12H   sodium chloride  flush  3 mL Intravenous Q12H   Continuous Infusions:  ceFEPime  (MAXIPIME ) IV       LOS: 3 days   35 minutes with more than 50% spent in reviewing records, counseling patient/family and coordinating care.  Reyes VEAR Gaw, MD [facility] [email or web address] 08/28/2024, 1:52 PM    "

## 2024-08-28 NOTE — Consult Note (Addendum)
 "        [facility]    Date of Admission:  08/25/2024   Total days of inpatient antibiotics 2        Reason for Consult: MRSA baacteremia    Principal Problem:   Failure to thrive (child) Active Problems:   Essential hypertension   ARTHRITIS, KNEE   Right pontine stroke (HCC)   Prediabetes   Generalized weakness   Primary osteoarthritis of right knee   CKD stage 3b, GFR 30-44 ml/min (HCC)   Hyperkalemia   Failure to thrive in adult   Chronic knee pain   Palliative care by specialist   Assessment: 74 year old female with CKD stage III, hypertension, hyperlipidemia, anemia of chronic disease, chronic pancreatic sufficiency, multiple hospitalizations for worsening kidney function found to have failure to thrive, decreased p.o. 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 - Chest x-ray showed patchy right lower lobe airspace opacity.  CT abdomen pelvis done today showed no acute intra-abdominal pathology.  Possible left deep femoral went DVT.  Recommend ultrasound for further evaluation.  Ultrasound showed acute DVT of the left peroneal veins, acute intramuscular thrombosis left soleal veins - CT angio chest showed PE bilaterally, strandy airspace disease possible atelectasis or infiltrate -Patient elbow pain, x-ray negative -ID engaged as blood Cx from 1/24 + mrsa 2/2 sets gpc. Staph aureus in urine is consistent with overflow and NOT the source of bacteremia.  Recommendations:  -Discontinue cefepime  - Start vancomycin  - Repeat blood cultures  tomorrow - TTE -Exchange lines - Engage orthopedics given left knee effusion - Agree with MR lumbar spine - Contact precautions Palliative following for GOC - New ID service on Monday Microbiology:   Antibiotics: Vancomycin  and cefepime   Cultures: Blood 1/24 2/2 GPC, bcid mrsa Urine 1/24 >100k colonics staph aureus Other   HPI: Alison Whitehead is a 74 y.o. female with CKD stage III, hypertension hyperlipidemia, anemia of chronic disease, prediabetes, chronic pancreatic insufficiency, multiple admissions for worsening kidney function, failure to tolerate chronic pain and confusion presented with generalized weakness decreased p.o. intake and falls from skilled facility.  Admitted for failure to thrive.  Found to have MRSA bacteremia.   Review of Systems: Review of Systems  All other systems reviewed and are negative.   Past Medical History:  Diagnosis Date   Allergy    seasonal   Arthritis    CKD (chronic kidney disease)    DDD (degenerative disc disease)    Degenerative disc disease, lumbar    Hypertension    IBS (irritable bowel syndrome)    Osteopenia    Osteoporosis    osteopenia   Stroke (HCC) 09/2019    Social History[1]  Family History  Problem Relation Age of Onset   Breast cancer Maternal Aunt    Diabetes Mother    Hypertension Mother    Glaucoma Mother    Other Father        accident   Diabetes Sister    Hypertension Sister    Cancer Sister    Colon cancer Neg Hx    Colon polyps Neg Hx    Esophageal cancer  Neg Hx    Stomach cancer Neg Hx    Rectal cancer Neg Hx    Scheduled Meds:  enoxaparin  (LOVENOX ) injection  100 mg Subcutaneous BID   sodium chloride  flush  3 mL Intravenous Q12H   sodium chloride  flush  3 mL Intravenous Q12H   Continuous Infusions:  ceFEPime  (MAXIPIME ) IV 2 g (08/28/24 1650)   [START ON 08/29/2024] vancomycin      PRN Meds:.acetaminophen  **OR** acetaminophen , baclofen , bisacodyl , hydrALAZINE , HYDROmorphone   (DILAUDID ) injection, ipratropium, ondansetron  **OR** ondansetron  (ZOFRAN ) IV, oxyCODONE , senna-docusate, sodium phosphate , traZODone  Allergies[2]  OBJECTIVE: Blood pressure 125/88, pulse (!) 110, temperature 99.9 F (37.7 C), temperature source Oral, resp. rate (!) 21, weight 102.6 kg, SpO2 99%.  Physical Exam HENT:     Head: Normocephalic and atraumatic.     Right Ear: Tympanic membrane normal.     Left Ear: Tympanic membrane normal.     Nose: Nose normal.     Mouth/Throat:     Mouth: Mucous membranes are moist.  Eyes:     Extraocular Movements: Extraocular movements intact.     Conjunctiva/sclera: Conjunctivae normal.     Pupils: Pupils are equal, round, and reactive to light.  Cardiovascular:     Rate and Rhythm: Normal rate and regular rhythm.     Heart sounds: No murmur heard.    No friction rub. No gallop.  Pulmonary:     Effort: Pulmonary effort is normal.     Breath sounds: Normal breath sounds.  Abdominal:     General: Abdomen is flat.     Palpations: Abdomen is soft.  Musculoskeletal:        General: Normal range of motion.  Psychiatric:        Mood and Affect: Mood normal.     Lab Results Lab Results  Component Value Date   WBC 10.3 08/28/2024   HGB 8.1 (L) 08/28/2024   HCT 26.2 (L) 08/28/2024   MCV 82.4 08/28/2024   PLT 480 (H) 08/28/2024    Lab Results  Component Value Date   CREATININE 1.20 (H) 08/28/2024   BUN 31 (H) 08/28/2024   NA 140 08/28/2024   K 4.3 08/28/2024   CL 108 08/28/2024   CO2 20 (L) 08/28/2024    Lab Results  Component Value Date   ALT 66 (H) 08/28/2024   AST 82 (H) 08/28/2024   ALKPHOS 175 (H) 08/28/2024   BILITOT 0.3 08/28/2024       Loney Stank, MD [facility] 08/28/2024, 11:45 PM Evaluation of this patient requires complex antimicrobial therapy evaluation and counseling + isolation needs for disease transmission risk assessment and mitigation      [1]   Social History Tobacco Use   Smoking status: Former    Current packs/day: 0.00    Types: Cigarettes    Quit date: 10/13/2019    Years since quitting: 4.8   Smokeless tobacco: Never  Vaping Use   Vaping status: Never Used  Substance Use Topics   Alcohol use: Never   Drug use: Never  [2]  Allergies Allergen Reactions   Nsaids Other (See Comments)    Contraindication due to CKD    Penicillins Itching   "

## 2024-08-28 NOTE — Plan of Care (Signed)
   Problem: Health Behavior/Discharge Planning: Goal: Ability to manage health-related needs will improve Outcome: Progressing   Problem: Clinical Measurements: Goal: Ability to maintain clinical measurements within normal limits will improve Outcome: Progressing

## 2024-08-28 NOTE — Progress Notes (Signed)
 Pharmacy Antibiotic Note  Alison Whitehead is a 74 y.o. female admitted on 08/25/2024 with bacteremia.  Pharmacy has been consulted for vancomycin  dosing.  WBC 10.3, afebrile. Scr 1.2 (CrCl 52 mL/min). Bcx 2/3 growing GPC in cluster > BCID showing MRSA.   Plan: Vancomycin  2g IV once then 1g IV every 24 hours (estAUC 505, Vd 0.5, Scr 1.2) Cefepime  2g IV every 12 hours given concern for HCAP Monitor renal fx, cx results, clinical pic, ID recommendations   Weight: 102.6 kg (226 lb 3.1 oz)  Temp (24hrs), Avg:98 F (36.7 C), Min:97.3 F (36.3 C), Max:98.4 F (36.9 C)  Recent Labs  Lab 08/25/24 1124 08/26/24 0442 08/27/24 1153 08/28/24 0439  WBC 11.7* 10.8* 10.1 10.3  CREATININE 2.59* 1.62* 1.29* 1.20*    Estimated Creatinine Clearance: 52.2 mL/min (A) (by C-G formula based on SCr of 1.2 mg/dL (H)).    Allergies[1]  Antimicrobials this admission: Vancomycin  1/25 >>  Cefepime  1/25 >>   Dose adjustments this admission: N/A  Microbiology results: 1/24 BCx: 3/4 GPC in clusters > BCID MRSA 1/24 UCx: sent   Thank you for allowing pharmacy to participate in this patient's care,  Suzen Sour, PharmD, BCCCP Clinical Pharmacist  Phone: [phone] 08/28/2024 2:11 PM  Please check AMION for all [facility] Pharmacy phone numbers After 10:00 PM, call Main Pharmacy [phone]     [1]  Allergies Allergen Reactions   Nsaids Other (See Comments)    Contraindication due to CKD    Penicillins Itching

## 2024-08-28 NOTE — Progress Notes (Signed)
 PHARMACY - PHYSICIAN COMMUNICATION CRITICAL VALUE ALERT - BLOOD CULTURE IDENTIFICATION (BCID)  Alison Whitehead is an 74 y.o. female who presented to [facility] on 08/25/2024 with a chief complaint of progressive generalized weakness, falls and poor intake.   Assessment:  WBC WNL at 10.3, afebrile. Scr 1.2 (CrCl 52 mL/min). Bcx 2/3 growing GPC in cluster > BCID MRSA.   Name of physician (or Provider) Contacted: Dr Reyes Gaw  Current antibiotics: cefepime   Changes to prescribed antibiotics recommended:  Adding in vancomycin  for MRSA bacteremia - ID consulted. Discussed with MD, plan to keep cefepime  for now until imaging can rule out PNA.   Results for orders placed or performed during the hospital encounter of 08/04/24  Blood Culture ID Panel (Reflexed) (Collected: 08/04/2024  3:30 PM)  Result Value Ref Range   Enterococcus faecalis NOT DETECTED NOT DETECTED   Enterococcus Faecium NOT DETECTED NOT DETECTED   Listeria monocytogenes NOT DETECTED NOT DETECTED   Staphylococcus species NOT DETECTED NOT DETECTED   Staphylococcus aureus (BCID) NOT DETECTED NOT DETECTED   Staphylococcus epidermidis NOT DETECTED NOT DETECTED   Staphylococcus lugdunensis NOT DETECTED NOT DETECTED   Streptococcus species DETECTED (A) NOT DETECTED   Streptococcus agalactiae NOT DETECTED NOT DETECTED   Streptococcus pneumoniae NOT DETECTED NOT DETECTED   Streptococcus pyogenes NOT DETECTED NOT DETECTED   A.calcoaceticus-baumannii NOT DETECTED NOT DETECTED   Bacteroides fragilis NOT DETECTED NOT DETECTED   Enterobacterales NOT DETECTED NOT DETECTED   Enterobacter cloacae complex NOT DETECTED NOT DETECTED   Escherichia coli NOT DETECTED NOT DETECTED   Klebsiella aerogenes NOT DETECTED NOT DETECTED   Klebsiella oxytoca NOT DETECTED NOT DETECTED   Klebsiella pneumoniae NOT DETECTED NOT DETECTED   Proteus species NOT DETECTED NOT DETECTED   Salmonella species NOT DETECTED NOT DETECTED   Serratia marcescens  NOT DETECTED NOT DETECTED   Haemophilus influenzae NOT DETECTED NOT DETECTED   Neisseria meningitidis NOT DETECTED NOT DETECTED   Pseudomonas aeruginosa NOT DETECTED NOT DETECTED   Stenotrophomonas maltophilia NOT DETECTED NOT DETECTED   Candida albicans NOT DETECTED NOT DETECTED   Candida auris NOT DETECTED NOT DETECTED   Candida glabrata NOT DETECTED NOT DETECTED   Candida krusei NOT DETECTED NOT DETECTED   Candida parapsilosis NOT DETECTED NOT DETECTED   Candida tropicalis NOT DETECTED NOT DETECTED   Cryptococcus neoformans/gattii NOT DETECTED NOT DETECTED    Thank you for allowing pharmacy to participate in this patient's care,  Suzen Sour, PharmD, BCCCP Clinical Pharmacist  Phone: [phone] 08/28/2024 2:04 PM  Please check AMION for all [facility] Pharmacy phone numbers After 10:00 PM, call Main Pharmacy [phone]

## 2024-08-29 ENCOUNTER — Other Ambulatory Visit (HOSPITAL_COMMUNITY): Payer: Self-pay

## 2024-08-29 ENCOUNTER — Inpatient Hospital Stay (HOSPITAL_COMMUNITY)

## 2024-08-29 ENCOUNTER — Telehealth (HOSPITAL_COMMUNITY): Payer: Self-pay

## 2024-08-29 LAB — GLUCOSE, CAPILLARY: Glucose-Capillary: 107 mg/dL — ABNORMAL HIGH (ref 70–99)

## 2024-08-29 LAB — URINE CULTURE: Culture: >=100000 — AB

## 2024-08-29 LAB — COMPREHENSIVE METABOLIC PANEL WITH GFR
ALT: 72 U/L — ABNORMAL HIGH (ref 0–44)
AST: 67 U/L — ABNORMAL HIGH (ref 15–41)
Albumin: 3 g/dL — ABNORMAL LOW (ref 3.5–5.0)
Alkaline Phosphatase: 180 U/L — ABNORMAL HIGH (ref 38–126)
Anion gap: 15 (ref 5–15)
BUN: 28 mg/dL — ABNORMAL HIGH (ref 8–23)
CO2: 18 mmol/L — ABNORMAL LOW (ref 22–32)
Calcium: 9.3 mg/dL (ref 8.9–10.3)
Chloride: 109 mmol/L (ref 98–111)
Creatinine, Ser: 1.27 mg/dL — ABNORMAL HIGH (ref 0.44–1.00)
GFR, Estimated: 45 mL/min — ABNORMAL LOW
Glucose, Bld: 102 mg/dL — ABNORMAL HIGH (ref 70–99)
Potassium: 4.3 mmol/L (ref 3.5–5.1)
Sodium: 141 mmol/L (ref 135–145)
Total Bilirubin: 0.4 mg/dL (ref 0.0–1.2)
Total Protein: 7.5 g/dL (ref 6.5–8.1)

## 2024-08-29 LAB — CBC
HCT: 27.5 % — ABNORMAL LOW (ref 36.0–46.0)
Hemoglobin: 8.3 g/dL — ABNORMAL LOW (ref 12.0–15.0)
MCH: 25 pg — ABNORMAL LOW (ref 26.0–34.0)
MCHC: 30.2 g/dL (ref 30.0–36.0)
MCV: 82.8 fL (ref 80.0–100.0)
Platelets: 442 10*3/uL — ABNORMAL HIGH (ref 150–400)
RBC: 3.32 MIL/uL — ABNORMAL LOW (ref 3.87–5.11)
RDW: 16.3 % — ABNORMAL HIGH (ref 11.5–15.5)
WBC: 10.7 10*3/uL — ABNORMAL HIGH (ref 4.0–10.5)
nRBC: 0 % (ref 0.0–0.2)

## 2024-08-29 LAB — SYNOVIAL CELL COUNT + DIFF, W/ CRYSTALS
Crystals, Fluid: NONE SEEN
Eosinophils-Synovial: 0 % (ref 0–1)
Lymphocytes-Synovial Fld: 3 % (ref 0–20)
Monocyte-Macrophage-Synovial Fluid: 4 % — ABNORMAL LOW (ref 50–90)
Neutrophil, Synovial: 93 % — ABNORMAL HIGH (ref 0–25)
WBC, Synovial: 27300 /cu mm — ABNORMAL HIGH (ref 0–200)

## 2024-08-29 MED ORDER — LORAZEPAM 2 MG/ML IJ SOLN: 0.5000 mg | Freq: Once | INTRAMUSCULAR | Status: AC

## 2024-08-29 MED ORDER — SENNOSIDES-DOCUSATE SODIUM 8.6-50 MG PO TABS: 1.0000 | ORAL_TABLET | Freq: Every evening | ORAL | Status: DC | PRN

## 2024-08-29 MED ORDER — LORAZEPAM 2 MG/ML IJ SOLN
INTRAMUSCULAR | Status: AC
  Administered 2024-08-29: 0.5 mg via INTRAVENOUS
  Filled 2024-08-29: qty 1

## 2024-08-29 MED ORDER — GADOBUTROL 1 MMOL/ML IV SOLN
10.0000 mL | Freq: Once | INTRAVENOUS | Status: AC | PRN
  Administered 2024-08-29: 10 mL via INTRAVENOUS

## 2024-08-29 MED ORDER — LIDOCAINE HCL 1 % IJ SOLN
10.0000 mL | Freq: Once | INTRAMUSCULAR | Status: AC
  Administered 2024-08-29: 10 mL via INTRADERMAL

## 2024-08-29 MED ORDER — POLYETHYLENE GLYCOL 3350 17 G PO PACK
17.0000 g | PACK | Freq: Every day | ORAL | Status: DC
  Administered 2024-08-29 – 2024-09-01 (×3): 17 g via ORAL
  Filled 2024-08-29 (×3): qty 1

## 2024-08-29 MED ORDER — LIDOCAINE HCL 1 % IJ SOLN
INTRAMUSCULAR | Status: AC
  Filled 2024-08-29: qty 20

## 2024-08-29 NOTE — Plan of Care (Signed)

## 2024-08-29 NOTE — Telephone Encounter (Signed)
 Pharmacy Patient Advocate Encounter  Insurance verification completed.    The patient is insured through [employer]. Patient has Medicare and is not eligible for a copay card, but may be able to apply for patient assistance or Medicare RX Payment Plan (Patient Must reach out to their plan, if eligible for payment plan), if available.    Ran test claim for xarelto 20mg  tablet and the current 30 day co-pay is $4.90.  Ran test claim for eliquis 5mg  tablet and the current 30 day co-pay is $4.90.  This test claim was processed through [facility]- copay amounts may vary at other pharmacies due to pharmacy/plan contracts, or as the patient moves through the different stages of their insurance plan.

## 2024-08-29 NOTE — Procedures (Signed)
 Interventional Radiology Procedure:   Indications: MRSA bacteremia and left knee joint effusion       Procedure: US  guided aspiration of left knee joint effusion  Findings: 40 ml of yellow cloudy fluid removed from lateral aspect of left knee joint   Complications: None     EBL: Minimal  Plan: Fluid sent for analysis   Vinie Charity R. Philip, MD  Pager: [phone]

## 2024-08-29 NOTE — Progress Notes (Signed)
 "                                                                                                                                                         Daily Progress Note   Patient Name: Alison Whitehead       Date: 08/29/2024 DOB: [birth year]  Age: 74 y.o. MRN#: [id number] Attending Physician: Elpidio Reyes DEL, MD Primary Care Physician: [name], PA-C Admit Date: 08/25/2024  Reason for Consultation/Follow-up: Establishing goals of care  Subjective: Attempted to visit patient x 2 however she was off unit for MRI and IR.  Called patient's daughter Alison Whitehead, [name].  She understands that patient made a decision for DNR/DNI for the first time last week, also setting other limits including no escalation of care to the ICU, no feeding tube, no dialysis.  Alison Whitehead shares her support of patient's wishes and her regret that they have not had more conversations about patient's wishes before this.  She also shares that patient lost her sister within the last month and this is likely impacting patient's decisions at this time.  Emphasized the importance of planning for the worst while hoping for the best, continuing conversations and focusing on patient's overall quality of life to help determine appropriateness of medical interventions that may or may not contribute to this.  Discussed pending additional diagnostics results (MRI, knee aspirate) and worrisome prognosis.    Physical Exam Vitals and nursing note reviewed.           Unable to assess - off unit when attempted Vital Signs: BP (!) 131/99 (BP Location: Left Arm)   Pulse (!) (P) 110   Temp 99.6 F (37.6 C) (Oral)   Resp (!) 22   Wt 102.6 kg   SpO2 95%   BMI 35.43 kg/m  SpO2: SpO2: 95 % O2 Device: O2 Device: Nasal Cannula O2 Flow Rate: O2 Flow Rate (L/min): 2 L/min      Palliative Assessment/Data: TBD   Palliative Care  Assessment & Plan   Patient Profile: 74 y.o. female  with past medical history of CKD IIIb, HTN, HLD, anemia of chronic disease, prediabetic, chronic pancreatic insufficiency, chronic knee pain admitted on 08/25/2024 with fall at home, poor oral intake, worsening weakness.    In the ED, patient was found to have AKI on CKD 3B likely due to dehydration, hyperkalemia, elbow pain due to her fall.  X-ray was reviewed, notable for some posterior swelling but no acute fracture.  Creatinine at 2.59, up from 1.26 on 1/4.  Mild leukocytosis and anemia.  She is being admitted for failure to thrive and therapies with anticipation of need to return to SNF/rehab.   Patient has had 2 admissions in the past 6  months for similar presentations, most recently less than 30 days ago.  PMT has been consulted to assist with goals of care conversation.  Assessment: Goals of care conversation MRSA bacteremia DVT/PE FTT  Recommendations/Plan: Continue DNR/DNI Continue current care plan.  Patient is hopeful for improvement, but if she declines she would not want escalation of care to the ICU.  No feeding tube or dialysis Patient's daughter was updated on these decisions today Psychosocial and emotional support provided Ongoing goals of care discussions PMT will continue to follow and support   Prognosis: Worrisome  Discharge Planning: To Be Determined  Care plan was discussed with patient's daughter   Mickle SHAUNNA Fell, PA-C  Palliative Medicine Team Team phone # [phone]  Thank you for allowing the Palliative Medicine Team to assist in the care of this patient. Please utilize secure chat with additional questions, if there is no response within 30 minutes please call the above phone number.  Palliative Medicine Team providers are available by phone from 7am to 7pm daily and can be reached through the team cell phone.  Should this patient require assistance outside of these hours, please call the  patient's attending physician.   I personally spent a total of 35 minutes in the care of the patient today including preparing to see the patient, getting/reviewing separately obtained history, counseling and educating, and documenting clinical information in the EHR.  "

## 2024-08-29 NOTE — Plan of Care (Signed)
   Problem: Elimination: Goal: Will not experience complications related to urinary retention Outcome: Progressing   Problem: Pain Managment: Goal: General experience of comfort will improve and/or be controlled Outcome: Progressing   Problem: Skin Integrity: Goal: Risk for impaired skin integrity will decrease Outcome: Progressing

## 2024-08-29 NOTE — Consult Note (Signed)
 Reason for Consult:Right knee pain Referring Physician: Reyes Gaw Time called: 0800 Time at bedside: 1029   Alison Whitehead is an 74 y.o. female.  HPI: Alison Whitehead was admitted 4d ago with confusion. She's had right knee pain for several days but not more than a week (though she was here in early January and c/o knee pain). She had a blood culture return positive for MRSA and orthopedic surgery was consulted to r/o septic joint. She underwent arthrocentesis on 1/3 that had no crystals and low WBC. She has an odd affect and manner of speech and history taking was challenging.  Past Medical History:  Diagnosis Date   Allergy    seasonal   Arthritis    CKD (chronic kidney disease)    DDD (degenerative disc disease)    Degenerative disc disease, lumbar    Hypertension    IBS (irritable bowel syndrome)    Osteopenia    Osteoporosis    osteopenia   Stroke (HCC) 09/2019    Past Surgical History:  Procedure Laterality Date   SHOULDER SURGERY Left    lipoma   TOTAL KNEE ARTHROPLASTY Left 12/26/2022   Procedure: LEFT TOTAL KNEE ARTHROPLASTY;  Surgeon: Harden Jerona GAILS, MD;  Location: [facility] OR;  Service: Orthopedics;  Laterality: Left;    Family History  Problem Relation Age of Onset   Breast cancer Maternal Aunt    Diabetes Mother    Hypertension Mother    Glaucoma Mother    Other Father        accident   Diabetes Sister    Hypertension Sister    Cancer Sister    Colon cancer Neg Hx    Colon polyps Neg Hx    Esophageal cancer Neg Hx    Stomach cancer Neg Hx    Rectal cancer Neg Hx     Social History:  reports that she quit smoking about 4 years ago. Her smoking use included cigarettes. She has never used smokeless tobacco. She reports that she does not drink alcohol and does not use drugs.  Allergies: Allergies[1]  Medications: I have reviewed the patient's current medications.  Results for orders placed or performed during the hospital encounter of 08/25/24 (from the  past 48 hours)  CBC     Status: Abnormal   Collection Time: 08/27/24 11:53 AM  Result Value Ref Range   WBC 10.1 4.0 - 10.5 K/uL   RBC 3.17 (L) 3.87 - 5.11 MIL/uL   Hemoglobin 8.1 (L) 12.0 - 15.0 g/dL   HCT 73.3 (L) 63.9 - 53.9 %   MCV 83.9 80.0 - 100.0 fL   MCH 25.6 (L) 26.0 - 34.0 pg   MCHC 30.5 30.0 - 36.0 g/dL   RDW 83.7 (H) 88.4 - 84.4 %   Platelets 475 (H) 150 - 400 K/uL   nRBC 0.0 0.0 - 0.2 %    Comment: Performed at [facility], [address] [place], [place] [address]  Basic metabolic panel with GFR     Status: Abnormal   Collection Time: 08/27/24 11:53 AM  Result Value Ref Range   Sodium 141 135 - 145 mmol/L   Potassium 4.4 3.5 - 5.1 mmol/L   Chloride 109 98 - 111 mmol/L   CO2 19 (L) 22 - 32 mmol/L   Glucose, Bld 145 (H) 70 - 99 mg/dL    Comment: Glucose reference range applies only to samples taken after fasting for at least 8 hours.   BUN 33 (H) 8 - 23  mg/dL   Creatinine, Ser 8.70 (H) 0.44 - 1.00 mg/dL   Calcium  9.1 8.9 - 10.3 mg/dL   GFR, Estimated 44 (L) >60 mL/min    Comment: (NOTE) Calculated using the CKD-EPI Creatinine Equation (2021)    Anion gap 13 5 - 15    Comment: Performed at [facility], [address], [place], [place] [address]  Urine Culture (for pregnant, neutropenic or urologic patients or patients with an indwelling urinary catheter)     Status: Abnormal   Collection Time: 08/27/24  7:26 PM   Specimen: Urine, Clean Catch  Result Value Ref Range   Specimen Description URINE, CLEAN CATCH    Special Requests      NONE Performed at [facility], [address] [place], [place] [address]    Culture (A)     >=100,000 COLONIES/mL METHICILLIN RESISTANT STAPHYLOCOCCUS AUREUS   Report Status 08/29/2024 FINAL    Organism ID, Bacteria METHICILLIN RESISTANT STAPHYLOCOCCUS AUREUS (A)       Susceptibility   Methicillin resistant staphylococcus aureus - MIC*    CIPROFLOXACIN >=8 RESISTANT Resistant     GENTAMICIN <=0.5 SENSITIVE  Sensitive     NITROFURANTOIN <=16 SENSITIVE Sensitive     OXACILLIN >=4 RESISTANT Resistant     TETRACYCLINE <=1 SENSITIVE Sensitive     VANCOMYCIN  1 SENSITIVE Sensitive     TRIMETH/SULFA <=10 SENSITIVE Sensitive     RIFAMPIN <=0.5 SENSITIVE Sensitive     Inducible Clindamycin POSITIVE Resistant     LINEZOLID 2 SENSITIVE Sensitive     * >=100,000 COLONIES/mL METHICILLIN RESISTANT STAPHYLOCOCCUS AUREUS  Culture, blood (Routine X 2) w Reflex to ID Panel     Status: Abnormal (Preliminary result)   Collection Time: 08/27/24 10:02 PM   Specimen: BLOOD LEFT HAND  Result Value Ref Range   Specimen Description BLOOD LEFT HAND    Special Requests      BOTTLES DRAWN AEROBIC AND ANAEROBIC Blood Culture adequate volume   Culture  Setup Time      GRAM POSITIVE COCCI IN BOTH AEROBIC AND ANAEROBIC BOTTLES CRITICAL RESULT CALLED TO, READ BACK BY AND VERIFIED WITH: PHARMD T. DANG [id number] AT 1345, ADC    Culture (A)     STAPHYLOCOCCUS AUREUS SUSCEPTIBILITIES TO FOLLOW Performed at [facility], [address] [place], [place] [address]    Report Status PENDING   Blood Culture ID Panel (Reflexed)     Status: Abnormal   Collection Time: 08/27/24 10:02 PM  Result Value Ref Range   Enterococcus faecalis NOT DETECTED NOT DETECTED   Enterococcus Faecium NOT DETECTED NOT DETECTED   Listeria monocytogenes NOT DETECTED NOT DETECTED   Staphylococcus species DETECTED (A) NOT DETECTED    Comment: CRITICAL RESULT CALLED TO, READ BACK BY AND VERIFIED WITH: PHARMD T. DANG [id number] AT 1345, ADC    Staphylococcus aureus (BCID) DETECTED (A) NOT DETECTED    Comment: Methicillin (oxacillin)-resistant Staphylococcus aureus (MRSA). MRSA is predictably resistant to beta-lactam antibiotics (except ceftaroline). Preferred therapy is vancomycin  unless clinically contraindicated. Patient requires contact precautions if  hospitalized. CRITICAL RESULT CALLED TO, READ BACK BY AND VERIFIED WITH: PHARMD T. DANG [id number]  AT 1345, ADC    Staphylococcus epidermidis NOT DETECTED NOT DETECTED   Staphylococcus lugdunensis NOT DETECTED NOT DETECTED   Streptococcus species NOT DETECTED NOT DETECTED   Streptococcus agalactiae NOT DETECTED NOT DETECTED   Streptococcus pneumoniae NOT DETECTED NOT DETECTED   Streptococcus pyogenes NOT DETECTED NOT DETECTED   A.calcoaceticus-baumannii NOT DETECTED  NOT DETECTED   Bacteroides fragilis NOT DETECTED NOT DETECTED   Enterobacterales NOT DETECTED NOT DETECTED   Enterobacter cloacae complex NOT DETECTED NOT DETECTED   Escherichia coli NOT DETECTED NOT DETECTED   Klebsiella aerogenes NOT DETECTED NOT DETECTED   Klebsiella oxytoca NOT DETECTED NOT DETECTED   Klebsiella pneumoniae NOT DETECTED NOT DETECTED   Proteus species NOT DETECTED NOT DETECTED   Salmonella species NOT DETECTED NOT DETECTED   Serratia marcescens NOT DETECTED NOT DETECTED   Haemophilus influenzae NOT DETECTED NOT DETECTED   Neisseria meningitidis NOT DETECTED NOT DETECTED   Pseudomonas aeruginosa NOT DETECTED NOT DETECTED   Stenotrophomonas maltophilia NOT DETECTED NOT DETECTED   Candida albicans NOT DETECTED NOT DETECTED   Candida auris NOT DETECTED NOT DETECTED   Candida glabrata NOT DETECTED NOT DETECTED   Candida krusei NOT DETECTED NOT DETECTED   Candida parapsilosis NOT DETECTED NOT DETECTED   Candida tropicalis NOT DETECTED NOT DETECTED   Cryptococcus neoformans/gattii NOT DETECTED NOT DETECTED   Meth resistant mecA/C and MREJ DETECTED (A) NOT DETECTED    Comment: CRITICAL RESULT CALLED TO, READ BACK BY AND VERIFIED WITH: PHARMD T. DANG [id number] AT 1345, ADC Performed at [facility], [address], [place], [place] [address]   Culture, blood (Routine X 2) w Reflex to ID Panel     Status: Abnormal (Preliminary result)   Collection Time: 08/27/24 10:03 PM   Specimen: BLOOD LEFT ARM  Result Value Ref Range   Specimen Description BLOOD LEFT ARM    Special Requests      BOTTLES DRAWN  AEROBIC AND ANAEROBIC Blood Culture adequate volume   Culture  Setup Time      GRAM POSITIVE COCCI IN CLUSTERS IN BOTH AEROBIC AND ANAEROBIC BOTTLES CRITICAL VALUE NOTED.  VALUE IS CONSISTENT WITH PREVIOUSLY REPORTED AND CALLED VALUE. Performed at [facility], [address] [place], [place] [address]    Culture STAPHYLOCOCCUS AUREUS (A)    Report Status PENDING   CBC     Status: Abnormal   Collection Time: 08/28/24  4:39 AM  Result Value Ref Range   WBC 10.3 4.0 - 10.5 K/uL   RBC 3.18 (L) 3.87 - 5.11 MIL/uL   Hemoglobin 8.1 (L) 12.0 - 15.0 g/dL   HCT 73.7 (L) 63.9 - 53.9 %   MCV 82.4 80.0 - 100.0 fL   MCH 25.5 (L) 26.0 - 34.0 pg   MCHC 30.9 30.0 - 36.0 g/dL   RDW 83.5 (H) 88.4 - 84.4 %   Platelets 480 (H) 150 - 400 K/uL   nRBC 0.2 0.0 - 0.2 %    Comment: Performed at [facility], [address] [place], [place] [address]  Comprehensive metabolic panel with GFR     Status: Abnormal   Collection Time: 08/28/24  4:39 AM  Result Value Ref Range   Sodium 140 135 - 145 mmol/L   Potassium 4.3 3.5 - 5.1 mmol/L   Chloride 108 98 - 111 mmol/L   CO2 20 (L) 22 - 32 mmol/L   Glucose, Bld 103 (H) 70 - 99 mg/dL    Comment: Glucose reference range applies only to samples taken after fasting for at least 8 hours.   BUN 31 (H) 8 - 23 mg/dL   Creatinine, Ser 8.79 (H) 0.44 - 1.00 mg/dL   Calcium  9.2 8.9 - 10.3 mg/dL   Total Protein 7.2 6.5 - 8.1 g/dL   Albumin  2.9 (L) 3.5 - 5.0 g/dL  AST 82 (H) 15 - 41 U/L   ALT 66 (H) 0 - 44 U/L   Alkaline Phosphatase 175 (H) 38 - 126 U/L   Total Bilirubin 0.3 0.0 - 1.2 mg/dL   GFR, Estimated 48 (L) >60 mL/min    Comment: (NOTE) Calculated using the CKD-EPI Creatinine Equation (2021)    Anion gap 12 5 - 15    Comment: Performed at [facility], [address] [place], [place] [address]  Glucose, capillary     Status: None   Collection Time: 08/28/24  7:44 AM  Result Value Ref Range   Glucose-Capillary 95 70 - 99 mg/dL     Comment: Glucose reference range applies only to samples taken after fasting for at least 8 hours.  Ammonia     Status: None   Collection Time: 08/28/24 11:05 AM  Result Value Ref Range   Ammonia 16 9 - 35 umol/L    Comment: Performed at [facility], [address], [place], [place] [address]  CBC     Status: Abnormal   Collection Time: 08/29/24  3:48 AM  Result Value Ref Range   WBC 10.7 (H) 4.0 - 10.5 K/uL   RBC 3.32 (L) 3.87 - 5.11 MIL/uL   Hemoglobin 8.3 (L) 12.0 - 15.0 g/dL   HCT 72.4 (L) 63.9 - 53.9 %   MCV 82.8 80.0 - 100.0 fL   MCH 25.0 (L) 26.0 - 34.0 pg   MCHC 30.2 30.0 - 36.0 g/dL   RDW 83.6 (H) 88.4 - 84.4 %   Platelets 442 (H) 150 - 400 K/uL   nRBC 0.0 0.0 - 0.2 %    Comment: Performed at [facility], [address] [place], [place] [address]  Comprehensive metabolic panel with GFR     Status: Abnormal   Collection Time: 08/29/24  3:48 AM  Result Value Ref Range   Sodium 141 135 - 145 mmol/L   Potassium 4.3 3.5 - 5.1 mmol/L   Chloride 109 98 - 111 mmol/L   CO2 18 (L) 22 - 32 mmol/L   Glucose, Bld 102 (H) 70 - 99 mg/dL    Comment: Glucose reference range applies only to samples taken after fasting for at least 8 hours.   BUN 28 (H) 8 - 23 mg/dL   Creatinine, Ser 8.72 (H) 0.44 - 1.00 mg/dL   Calcium  9.3 8.9 - 10.3 mg/dL   Total Protein 7.5 6.5 - 8.1 g/dL   Albumin  3.0 (L) 3.5 - 5.0 g/dL   AST 67 (H) 15 - 41 U/L   ALT 72 (H) 0 - 44 U/L   Alkaline Phosphatase 180 (H) 38 - 126 U/L   Total Bilirubin 0.4 0.0 - 1.2 mg/dL   GFR, Estimated 45 (L) >60 mL/min    Comment: (NOTE) Calculated using the CKD-EPI Creatinine Equation (2021)    Anion gap 15 5 - 15    Comment: Performed at [facility], [address], [place], [place] [address]  Glucose, capillary     Status: Abnormal   Collection Time: 08/29/24  6:24 AM  Result Value Ref Range   Glucose-Capillary 107 (H) 70 - 99 mg/dL    Comment: Glucose reference range applies only to samples taken after  fasting for at least 8 hours.    VAS US  LOWER EXTREMITY VENOUS (DVT) Result Date: 08/28/2024  Lower Venous DVT Study Patient Name:  [facility]  Date of Exam:   08/28/2024 Medical Rec #: [id number]  Accession #:    [id number] Date of Birth: 10-19-1950            Patient Gender: F Patient Age:   82 years Exam Location:  [facility] Procedure:      VAS US  LOWER EXTREMITY VENOUS (DVT) Referring Phys: JEFFREY WALDEN --------------------------------------------------------------------------------  Indications: Possible DVT seen on CT abd/pelvis.  Limitations: Poor ultrasound/tissue interface. Comparison Study: No previous exams Performing Technologist: Jody Hill RVT, RDMS  Examination Guidelines: A complete evaluation includes B-mode imaging, spectral Doppler, color Doppler, and power Doppler as needed of all accessible portions of each vessel. Bilateral testing is considered an integral part of a complete examination. Limited examinations for reoccurring indications may be performed as noted. The reflux portion of the exam is performed with the patient in reverse Trendelenburg.  +---------+---------------+---------+-----------+-------------+--------------+ RIGHT    CompressibilityPhasicitySpontaneityProperties   Thrombus Aging +---------+---------------+---------+-----------+-------------+--------------+ CFV      Full           Yes      Yes        rouleaux flow               +---------+---------------+---------+-----------+-------------+--------------+ SFJ      Full                               rouleaux flow               +---------+---------------+---------+-----------+-------------+--------------+ FV Prox  Full           Yes      Yes                                    +---------+---------------+---------+-----------+-------------+--------------+ FV Mid   Full           Yes      Yes                                     +---------+---------------+---------+-----------+-------------+--------------+ FV DistalFull           Yes      Yes                                    +---------+---------------+---------+-----------+-------------+--------------+ PFV      Full                                                           +---------+---------------+---------+-----------+-------------+--------------+ POP      Full           Yes      Yes                                    +---------+---------------+---------+-----------+-------------+--------------+ PTV      Full                                                           +---------+---------------+---------+-----------+-------------+--------------+  PERO     Full                                                           +---------+---------------+---------+-----------+-------------+--------------+   +---------+---------------+---------+-----------+-------------+--------------+ LEFT     CompressibilityPhasicitySpontaneityProperties   Thrombus Aging +---------+---------------+---------+-----------+-------------+--------------+ CFV      Full           Yes      Yes        rouleaux flow               +---------+---------------+---------+-----------+-------------+--------------+ SFJ      Full                               rouleaux flow               +---------+---------------+---------+-----------+-------------+--------------+ FV Prox  Full           Yes      Yes                                    +---------+---------------+---------+-----------+-------------+--------------+ FV Mid   Full           Yes      Yes                                    +---------+---------------+---------+-----------+-------------+--------------+ FV DistalFull           Yes      Yes                                    +---------+---------------+---------+-----------+-------------+--------------+ PFV      Full                                                            +---------+---------------+---------+-----------+-------------+--------------+ POP      Full                                                           +---------+---------------+---------+-----------+-------------+--------------+ PTV      Full                                                           +---------+---------------+---------+-----------+-------------+--------------+ PERO     None           No       No                      Acute          +---------+---------------+---------+-----------+-------------+--------------+  Soleal   None           No       No                      Acute          +---------+---------------+---------+-----------+-------------+--------------+     Summary: BILATERAL: -No evidence of popliteal cyst, bilaterally. RIGHT: - There is no evidence of deep vein thrombosis in the lower extremity.  LEFT: - Findings consistent with acute deep vein thrombosis involving the left peroneal veins. Findings consistent with acute intramuscular thrombosis involving the left soleal veins.  *See table(s) above for measurements and observations. Electronically signed by Norman Serve on 08/28/2024 at 6:50:55 PM.    Final    CT Angio Chest Pulmonary Embolism (PE) W or WO Contrast Addendum Date: 08/28/2024 ADDENDUM REPORT: 08/28/2024 15:42 ADDENDUM: Critical Value/emergent results were called by telephone at the time of interpretation on 08/28/2024 at 3:40 pm to provider Bari Boards, who verbally acknowledged these results. Electronically Signed   By: Leita Birmingham M.D.   On: 08/28/2024 15:42   Result Date: 08/28/2024 CLINICAL DATA:  Pulmonary embolism suspected, high probability. New hypoxic requirement with tachycardia. Possible DVT. EXAM: CT ANGIOGRAPHY CHEST WITH CONTRAST TECHNIQUE: Multidetector CT imaging of the chest was performed using the standard protocol during bolus administration of intravenous contrast. Multiplanar CT image  reconstructions and MIPs were obtained to evaluate the vascular anatomy. RADIATION DOSE REDUCTION: This exam was performed according to the departmental dose-optimization program which includes automated exposure control, adjustment of the mA and/or kV according to patient size and/or use of iterative reconstruction technique. CONTRAST:  75mL OMNIPAQUE  IOHEXOL  350 MG/ML SOLN COMPARISON:  None Available. FINDINGS: Cardiovascular: The heart is enlarged and there is a trace pericardial effusion. Scattered coronary artery calcifications are present. There is atherosclerotic calcification of the aorta with aneurysmal dilatation of the ascending aorta measuring 4.8 cm. The pulmonary trunk is distended suggesting underlying pulmonary artery hypertension. Small segmental and subsegmental pulmonary emboli are noted in the lower lobes bilaterally. Examination is limited due to mixing artifact and respiratory motion. Mediastinum/Nodes: No mediastinal, hilar, or axillary lymphadenopathy is seen. The thyroid gland, trachea, and esophagus are within normal limits. Lungs/Pleura: Centrilobular emphysematous changes are present in the lungs. A strandy airspace disease is noted in the infrahilar right middle lobe. Atelectasis is present bilaterally. No effusion or pneumothorax is seen. Upper Abdomen: No acute abnormality. Musculoskeletal: Degenerative changes are noted in the thoracic spine. No acute osseous abnormality is seen. Review of the MIP images confirms the above findings. IMPRESSION: 1. Small segmental and subsegmental pulmonary emboli bilaterally. 2. Strandy airspace disease in the infrahilar region of the right middle lobe, possible atelectasis or infiltrate. 3. Cardiomegaly with coronary artery calcifications. 4. 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 Ascending thoracic aortic aneurysm. Recommend semi-annual imaging followup by CTA or MRA and referral to cardiothoracic  surgery if not already obtained. This recommendation follows 2010 ACCF/AHA/AATS/ACR/ASA/SCA/SCAI/SIR/STS/SVM Guidelines for the Diagnosis and Management of Patients With Thoracic Aortic Disease. Circulation. 2010; 121: Z733-z630. Aortic aneurysm NOS (ICD10-I71.9) Electronically Signed: By: Leita Birmingham M.D. On: 08/28/2024 15:20   CT ABDOMEN PELVIS W CONTRAST Result Date: 08/28/2024 CLINICAL DATA:  Abdominal pain. EXAM: CT ABDOMEN AND PELVIS WITH CONTRAST TECHNIQUE: Multidetector CT imaging of the abdomen and pelvis was performed using the standard protocol following bolus administration of intravenous contrast. RADIATION DOSE REDUCTION: This exam was performed according to the departmental dose-optimization program  which includes automated exposure control, adjustment of the mA and/or kV according to patient size and/or use of iterative reconstruction technique. CONTRAST:  75mL OMNIPAQUE  IOHEXOL  350 MG/ML SOLN COMPARISON:  CT abdomen pelvis dated 07/23/2024. FINDINGS: Evaluation of this exam is limited due to respiratory motion. Lower chest: No acute abnormality. No intra-abdominal free air or free fluid. Hepatobiliary: Fatty liver. No biliary dilatation. The gallbladder is unremarkable Pancreas: Unremarkable. No pancreatic ductal dilatation or surrounding inflammatory changes. Spleen: Normal in size without focal abnormality. Adrenals/Urinary Tract: The adrenal glands are unremarkable. Small right renal cyst. There is no hydronephrosis on either side. There is symmetric enhancement and excretion of contrast by both kidneys. The visualized ureters and urinary bladder appear unremarkable. Stomach/Bowel: There is moderate distal colonic diverticulosis. There is no bowel obstruction or active inflammation. The appendix is normal. Vascular/Lymphatic: Moderate aortoiliac atherosclerotic disease. The IVC is unremarkable. Faint hypodense focus in the right, femoral vein at the junction of the deep femoral and femoral  veins (92/3) suspicious for DVT. Further evaluation with duplex ultrasound recommended. Slight decreased contrast density in the left deep femoral vein (91/3) may be artifactual or represent DVT. No portal venous gas. There is no adenopathy. Reproductive: Multiple calcified uterine fibroids. No suspicious adnexal masses. Other: Small fat containing right inguinal hernia. Musculoskeletal: Osteopenia with degenerative changes. Grade 1 L4-L5 anterolisthesis. No acute osseous pathology. IMPRESSION: 1. No acute intra-abdominal or pelvic pathology. 2. Colonic diverticulosis. No bowel obstruction. Normal appendix. 3. Fatty liver. 4. Findings concerning for right common femoral vein and possibly left deep femoral vein DVT. Further evaluation with duplex ultrasound recommended. 5.  Aortic Atherosclerosis (ICD10-I70.0). These results will be called to the ordering clinician or representative by the Radiologist Assistant, and communication documented in the PACS or [employer]. Electronically Signed   By: Vanetta Chou M.D.   On: 08/28/2024 10:12   DG Chest Port 1 View Result Date: 08/27/2024 EXAM: 1 VIEW(S) XRAY OF THE CHEST 08/27/2024 04:58:00 PM COMPARISON: 07/12/2024 CLINICAL HISTORY: Fever. FINDINGS: LUNGS AND PLEURA: Patchy right lower lobe airspace opacity. No pleural effusion. No pneumothorax. HEART AND MEDIASTINUM: Cardiomegaly. Atherosclerotic calcifications. BONES AND SOFT TISSUES: No acute osseous abnormality. IMPRESSION: 1. Patchy right lower lobe airspace opacity, suspicious for pneumonia. Electronically signed by: Oneil Devonshire MD 08/27/2024 05:41 PM EST RP Workstation: [id number]    Review of Systems  Unable to perform ROS: Other  Musculoskeletal:  Positive for arthralgias (Right knee).   Blood pressure (!) 130/93, pulse (!) 112, temperature 98.5 F (36.9 C), temperature source Oral, resp. rate 18, weight 102.6 kg, SpO2 96%. Physical Exam Constitutional:      General: She is not in acute  distress.    Appearance: She is well-developed. She is not diaphoretic.  HENT:     Head: Normocephalic and atraumatic.  Eyes:     General: No scleral icterus.       Right eye: No discharge.        Left eye: No discharge.     Conjunctiva/sclera: Conjunctivae normal.  Cardiovascular:     Rate and Rhythm: Normal rate and regular rhythm.  Pulmonary:     Effort: Pulmonary effort is normal. No respiratory distress.  Musculoskeletal:     Cervical back: Normal range of motion.     Comments: RLE No traumatic wounds, ecchymosis, or rash  Mod TTP knee, severe pain with PROM knee  No knee or ankle effusion  Knee stable to varus/ valgus and anterior/posterior stress  Sens DPN, SPN, TN intact  Motor EHL,  ext, flex, evers 5/5  DP 2+, PT 2+, No significant edema  Skin:    General: Skin is warm and dry.  Neurological:     Mental Status: She is alert.  Psychiatric:        Mood and Affect: Mood normal.        Behavior: Behavior normal.     Assessment/Plan: Right knee pain -- No appreciable effusion on exam. Recommended IR aspiration as I think imaging guidance will increase chance of success. If they decline will attempt blind aspiration.    Ozell DOROTHA Ned, PA-C Orthopedic Surgery [phone] 08/29/2024, 10:44 AM     [1]  Allergies Allergen Reactions   Nsaids Other (See Comments)    Contraindication due to CKD    Penicillins Itching

## 2024-08-29 NOTE — TOC Progression Note (Signed)
 Transition of Care [facility]) - Progression Note    Patient Details  Name: Alison Whitehead MRN: [id number] Date of Birth: 1951-03-20  [facility] CM/SW Contact  Bridget Cordella Simmonds, LCSW Phone Number: 08/29/2024, 2:10 PM  Clinical Narrative:   Pt oriented x1.  CSW spoke with pt daughter [name].  Current bed offers provided.  She does want to know if [name] can take pt again.  CSW also sent message to [facility] requesting response.  1400: [facility] does offer, daughter updated.     Expected Discharge Plan: Skilled Nursing Facility Barriers to Discharge: English As A Second Language Teacher, Continued Medical Work up, No SNF bed               Expected Discharge Plan and Services In-house Referral: Clinical Social Work   Post Acute Care Choice: Skilled Nursing Facility Living arrangements for the past 2 months: Apartment, Skilled Nursing Facility                                       Social Drivers of Health (SDOH) Interventions SDOH Screenings   Food Insecurity: Patient Unable To Answer (08/27/2024)  Housing: Low Risk (08/27/2024)  Transportation Needs: No Transportation Needs (08/04/2024)  Utilities: Not At Risk (08/04/2024)  Depression (PHQ2-9): Low Risk (07/02/2022)  Social Connections: Unknown (08/04/2024)  Recent Concern: Social Connections - Moderately Isolated (07/18/2024)  Tobacco Use: Medium Risk (08/25/2024)    Readmission Risk Interventions    12/28/2022    1:24 PM  Readmission Risk Prevention Plan  Post Dischage Appt Complete  Medication Screening Not Complete  Medication Screening Not Complete Comment N/A  Transportation Screening Not Complete  Transportation Screening Comment N/A

## 2024-08-29 NOTE — Progress Notes (Addendum)
 " PROGRESS NOTE  Alison Whitehead  FMW:[id number] DOB: February 21, 1951 DOA: 08/25/2024 PCP: [name], PA-C  Consultants  Brief Narrative: 74 y.o Female with extensive history of CKD IIIb, HTN, HLD, anemia of chronic disease, prediabetic, chronic pancreatic insufficiency... Multiple admission for worsening kidney function, failure to thrive, knee pain, confusion was discharged to SNF for rehab home now back again to ED due to progressive generalized weakness, falls, poor p.o. intake.. Denies having any fever or chills nausea or vomiting, or dysuria.   Recent hospitalizations for similar complaint generalized weakness, spontaneous falls, poor p.o. intake, AKIs Recent hospitalization discharge 08/04/24 - 08/09/24 Previous to that 07/12/2024 -07/26/2024.  Requested for patient to admitted for failure to thrive, progressive generalized weakness, acute on chronic kidney disease, hyperkalemia.    1/25:  Found to have acute DVT left peroneal vein plus acute intramuscular thrombosis involving the left soleal veins as well as (+) MRSA bacteremia.     Assessment & Plan: Assessment and Plan: DVT/PE:   - CTA 1/25 evening revealed small subsegmental PE's BL - already on full dose lovenox . Will tx to eliquis prior to DC.  - pain prior to admission in Left knee makes it concerning she's had this DVT for a while.  Has been on PPX heparin  throughout hospital this admit  MRSA bacteremia/sepsis based on SIRS criteria - ordered after she spike a fever 1/23 - 2/3 bottles now reportedly growing MRSA - starting vanco, ID autoconsulted - appreciate ID input  - TTE pending May need TEE based on results.  Fever:  - new 1/23 evening - reason for initial blood cultures.   - Previously  on cefepime  for concern for HAP -- await CTA and stop cefepime  if no evidence of PNA.  ID stopped cefepime , some strandy airspace disease of R middle lobe.   - Low threshold to restart more broad based antibiotics if she  decompensates or white count continues to rise after stopping her cefepime  1/25  Low back pain: - Subjectively, her main issue.  Describes exquisite back pain with spasms. - Was being controlled on Dilaudid , switched to morphine  but this did not control pain and she is being switched back to Dilaudid . - Also on baclofen . - MRI back obtained and no evidence of seeding to paraspinal muscles nor vertebral spaces/discitis  Failure to thrive, with progressive generalized weaknesses, with frequent falls - Frequent readmissions.   - Consulted PT OT--> recommending SNF. - Recent admit and discharge to SNF - Patient likely need longer SNF stay for rehab - Fall precautions - Appreciate palliative input--> unable to reach family.   Hyperkalemia : -Now resolved. - Will trend.  Transaminitis: - Unclear cause, multifactorial possible etiologies. -Now found to have MRSA bacteremia plus bilateral small PEs. - Will continue to trend, fortunately LFTs downtrending.   AKI on CKD IIIb -AKI has resolved.  Baseline Cr 1.3-1.5  Cr 2.59 hold aspirin , furosemide , olmesartan  metformin - Avoid hypotension and nephrotoxic agents. Continues to improve. -Leading to metabolic acidosis. - Continue to trend creatinine in light of contrast dye she received for CT angiogram as well as being on vancomycin .  Chronic right knee osteoarthritis/left knee pain - During last hospitalization, status post arthrocentesis yielding 50 cc of fluid, with negative fluid analysis.  No signs of infection - Now complaining more of left knee pain - IR able to aspirate 40 cc of fluid from left knee effusion.  Sending for culture.    Anemia of CKD Anemia of chronic disease, due to CKD Stable, obtaining  iron studies   Hypertension Continue: Amlodipine , Coreg ,  Holding Lasix  and olmesartan .   Prediabetic Hemoglobin A1c only 5.8. Monitoring CBC closely   Class 2 obesity Last weight 107.4 kg - Resume Ozempic  at discharge.    Pancreatic insufficiency - Continue Creon   Ascending thoracic aortic aneurysm: - Found on CT angiogram yesterday.  Measuring 4.8 cm.  Recommend semiannual follow-up by CTA/MRA and outpatient referral to cardiothoracic surgery. -Please include this on discharge summary as well.     DVT prophylaxis:  SCDs Start: 08/25/24 1431  Code Status:   Code Status: Limited: Do not attempt resuscitation (DNR) -DNR-LIMITED -Do Not Intubate/DNI  Level of care: Telemetry Status is: Inpatient   Subjective: Patient lying in bed, in pain.  Crying out, but reports she feels better today than yesterday.  Still complaining mostly low back pain.  Spasms that occur suddenly.  Still not really hungry.  Objective: Vitals:   08/29/24 0643 08/29/24 1005 08/29/24 1006 08/29/24 1318  BP: (!) 131/99 (!) 132/103 (!) 130/93 (!) 132/90  Pulse: (!) (P) 110 (!) 113 (!) 112 (!) 109  Resp: (!) 22 18  18   Temp: 99.6 F (37.6 C) 98.5 F (36.9 C)  98.9 F (37.2 C)  TempSrc: Oral Oral  Oral  SpO2: 95% 94% 96% 97%  Weight:        Intake/Output Summary (Last 24 hours) at 08/29/2024 1507 Last data filed at 08/28/2024 2345 Gross per 24 hour  Intake --  Output 700 ml  Net -700 ml   Filed Weights   08/27/24 0500 08/28/24 0500  Weight: 100.7 kg 102.6 kg   Body mass index is 35.43 kg/m.  Gen: 74 y.o. female in no apparent distress.  Nontoxic in mild distress due to back pain.  Nasal cannula in place. Pulm: Non-labored breathing.  Clear to auscultation bilaterally.  CV: Tachycardic with regular rhythm GI: Abdomen soft, mildly distended, TTP suprapubic with mild guarding Ext: Warm, no deformities.  SCDs in place MSK: With right compression sleeve in place around right knee.  Left knee with well-healed patella scar.  Does have area of scabbing over her scar.  With notable effusion and tenderness to palpation suprapatellar region.  No redness or warmth noted today. Skin: No rashes, lesions  Neuro: Alert and oriented  to person and place.  Does not know why she is in the hospital. No focal neurological deficits. Psych: Anxious appearing, crying out.   I have personally reviewed the following labs and images: CBC: Recent Labs  Lab 08/25/24 1124 08/26/24 0442 08/27/24 1153 08/28/24 0439 08/29/24 0348  WBC 11.7* 10.8* 10.1 10.3 10.7*  HGB 8.2* 8.0* 8.1* 8.1* 8.3*  HCT 27.6* 26.6* 26.6* 26.2* 27.5*  MCV 84.9 85.3 83.9 82.4 82.8  PLT 538* 512* 475* 480* 442*   BMP &GFR Recent Labs  Lab 08/25/24 1124 08/25/24 1757 08/26/24 0442 08/27/24 1153 08/28/24 0439 08/29/24 0348  NA 136  --  138 141 140 141  K 5.7*  --  5.4* 4.4 4.3 4.3  CL 102  --  107 109 108 109  CO2 18*  --  17* 19* 20* 18*  GLUCOSE 109*  --  87 145* 103* 102*  BUN 63*  --  48* 33* 31* 28*  CREATININE 2.59*  --  1.62* 1.29* 1.20* 1.27*  CALCIUM  9.1  --  9.1 9.1 9.2 9.3  MG  --  2.5*  --   --   --   --   PHOS  --  3.9  --   --   --   --  Estimated Creatinine Clearance: 49.3 mL/min (A) (by C-G formula based on SCr of 1.27 mg/dL (H)). Liver & Pancreas: Recent Labs  Lab 08/25/24 1124 08/26/24 0442 08/28/24 0439 08/29/24 0348  AST 16 19 82* 67*  ALT 23 19 66* 72*  ALKPHOS 114 113 175* 180*  BILITOT 0.3 0.4 0.3 0.4  PROT 8.1 7.6 7.2 7.5  ALBUMIN  3.6 3.3* 2.9* 3.0*   No results for input(s): LIPASE, AMYLASE in the last 168 hours. Recent Labs  Lab 08/28/24 1105  AMMONIA 16   Diabetic: No results for input(s): HGBA1C in the last 72 hours. Recent Labs  Lab 08/25/24 1409 08/26/24 0910 08/28/24 0744 08/29/24 0624  GLUCAP 97 93 95 107*   Cardiac Enzymes: No results for input(s): CKTOTAL, CKMB, CKMBINDEX, TROPONINI in the last 168 hours. No results for input(s): PROBNP in the last 8760 hours. Coagulation Profile: No results for input(s): INR, PROTIME in the last 168 hours. Thyroid Function Tests: No results for input(s): TSH, T4TOTAL, FREET4, T3FREE, THYROIDAB in the last 72  hours.  Lipid Profile: No results for input(s): CHOL, HDL, LDLCALC, TRIG, CHOLHDL, LDLDIRECT in the last 72 hours. Anemia Panel: No results for input(s): VITAMINB12, FOLATE, FERRITIN, TIBC, IRON, RETICCTPCT in the last 72 hours.  Urine analysis:    Component Value Date/Time   COLORURINE YELLOW 08/25/2024 1722   APPEARANCEUR CLEAR 08/25/2024 1722   LABSPEC 1.016 08/25/2024 1722   PHURINE 5.0 08/25/2024 1722   GLUCOSEU 50 (A) 08/25/2024 1722   HGBUR NEGATIVE 08/25/2024 1722   BILIRUBINUR NEGATIVE 08/25/2024 1722   KETONESUR NEGATIVE 08/25/2024 1722   PROTEINUR NEGATIVE 08/25/2024 1722   NITRITE NEGATIVE 08/25/2024 1722   LEUKOCYTESUR SMALL (A) 08/25/2024 1722   Sepsis Labs: Invalid input(s): PROCALCITONIN, LACTICIDVEN  Microbiology: Recent Results (from the past 240 hours)  Urine Culture (for pregnant, neutropenic or urologic patients or patients with an indwelling urinary catheter)     Status: Abnormal   Collection Time: 08/27/24  7:26 PM   Specimen: Urine, Clean Catch  Result Value Ref Range Status   Specimen Description URINE, CLEAN CATCH  Final   Special Requests   Final    NONE Performed at [facility], [address], [place], [place] [address]    Culture (A)  Final    >=100,000 COLONIES/mL METHICILLIN RESISTANT STAPHYLOCOCCUS AUREUS   Report Status 08/29/2024 FINAL  Final   Organism ID, Bacteria METHICILLIN RESISTANT STAPHYLOCOCCUS AUREUS (A)  Final      Susceptibility   Methicillin resistant staphylococcus aureus - MIC*    CIPROFLOXACIN >=8 RESISTANT Resistant     GENTAMICIN <=0.5 SENSITIVE Sensitive     NITROFURANTOIN <=16 SENSITIVE Sensitive     OXACILLIN >=4 RESISTANT Resistant     TETRACYCLINE <=1 SENSITIVE Sensitive     VANCOMYCIN  1 SENSITIVE Sensitive     TRIMETH/SULFA <=10 SENSITIVE Sensitive     RIFAMPIN <=0.5 SENSITIVE Sensitive     Inducible Clindamycin POSITIVE Resistant     LINEZOLID 2 SENSITIVE Sensitive     *  >=100,000 COLONIES/mL METHICILLIN RESISTANT STAPHYLOCOCCUS AUREUS  Culture, blood (Routine X 2) w Reflex to ID Panel     Status: Abnormal (Preliminary result)   Collection Time: 08/27/24 10:02 PM   Specimen: BLOOD LEFT HAND  Result Value Ref Range Status   Specimen Description BLOOD LEFT HAND  Final   Special Requests   Final    BOTTLES DRAWN AEROBIC AND ANAEROBIC Blood Culture adequate volume   Culture  Setup Time   Final  GRAM POSITIVE COCCI IN BOTH AEROBIC AND ANAEROBIC BOTTLES CRITICAL RESULT CALLED TO, READ BACK BY AND VERIFIED WITH: PHARMD T. DANG [id number] AT 1345, ADC    Culture (A)  Final    STAPHYLOCOCCUS AUREUS SUSCEPTIBILITIES TO FOLLOW Performed at [facility], [address] [place], [place] [address]    Report Status PENDING  Incomplete  Blood Culture ID Panel (Reflexed)     Status: Abnormal   Collection Time: 08/27/24 10:02 PM  Result Value Ref Range Status   Enterococcus faecalis NOT DETECTED NOT DETECTED Final   Enterococcus Faecium NOT DETECTED NOT DETECTED Final   Listeria monocytogenes NOT DETECTED NOT DETECTED Final   Staphylococcus species DETECTED (A) NOT DETECTED Final    Comment: CRITICAL RESULT CALLED TO, READ BACK BY AND VERIFIED WITH: PHARMD T. DANG [id number] AT 1345, ADC    Staphylococcus aureus (BCID) DETECTED (A) NOT DETECTED Final    Comment: Methicillin (oxacillin)-resistant Staphylococcus aureus (MRSA). MRSA is predictably resistant to beta-lactam antibiotics (except ceftaroline). Preferred therapy is vancomycin  unless clinically contraindicated. Patient requires contact precautions if  hospitalized. CRITICAL RESULT CALLED TO, READ BACK BY AND VERIFIED WITH: PHARMD T. DANG [id number] AT 1345, ADC    Staphylococcus epidermidis NOT DETECTED NOT DETECTED Final   Staphylococcus lugdunensis NOT DETECTED NOT DETECTED Final   Streptococcus species NOT DETECTED NOT DETECTED Final   Streptococcus agalactiae NOT DETECTED NOT DETECTED Final    Streptococcus pneumoniae NOT DETECTED NOT DETECTED Final   Streptococcus pyogenes NOT DETECTED NOT DETECTED Final   A.calcoaceticus-baumannii NOT DETECTED NOT DETECTED Final   Bacteroides fragilis NOT DETECTED NOT DETECTED Final   Enterobacterales NOT DETECTED NOT DETECTED Final   Enterobacter cloacae complex NOT DETECTED NOT DETECTED Final   Escherichia coli NOT DETECTED NOT DETECTED Final   Klebsiella aerogenes NOT DETECTED NOT DETECTED Final   Klebsiella oxytoca NOT DETECTED NOT DETECTED Final   Klebsiella pneumoniae NOT DETECTED NOT DETECTED Final   Proteus species NOT DETECTED NOT DETECTED Final   Salmonella species NOT DETECTED NOT DETECTED Final   Serratia marcescens NOT DETECTED NOT DETECTED Final   Haemophilus influenzae NOT DETECTED NOT DETECTED Final   Neisseria meningitidis NOT DETECTED NOT DETECTED Final   Pseudomonas aeruginosa NOT DETECTED NOT DETECTED Final   Stenotrophomonas maltophilia NOT DETECTED NOT DETECTED Final   Candida albicans NOT DETECTED NOT DETECTED Final   Candida auris NOT DETECTED NOT DETECTED Final   Candida glabrata NOT DETECTED NOT DETECTED Final   Candida krusei NOT DETECTED NOT DETECTED Final   Candida parapsilosis NOT DETECTED NOT DETECTED Final   Candida tropicalis NOT DETECTED NOT DETECTED Final   Cryptococcus neoformans/gattii NOT DETECTED NOT DETECTED Final   Meth resistant mecA/C and MREJ DETECTED (A) NOT DETECTED Final    Comment: CRITICAL RESULT CALLED TO, READ BACK BY AND VERIFIED WITH: PHARMD T. DANG [id number] AT 1345, ADC Performed at [facility], [address] [place], [place] [address]   Culture, blood (Routine X 2) w Reflex to ID Panel     Status: Abnormal (Preliminary result)   Collection Time: 08/27/24 10:03 PM   Specimen: BLOOD LEFT ARM  Result Value Ref Range Status   Specimen Description BLOOD LEFT ARM  Final   Special Requests   Final    BOTTLES DRAWN AEROBIC AND ANAEROBIC Blood Culture adequate volume   Culture   Setup Time   Final    GRAM POSITIVE COCCI IN CLUSTERS IN BOTH AEROBIC AND ANAEROBIC BOTTLES CRITICAL VALUE NOTED.  VALUE IS  CONSISTENT WITH PREVIOUSLY REPORTED AND CALLED VALUE. Performed at [facility], [address], [place], [place] [address]    Culture STAPHYLOCOCCUS AUREUS (A)  Final   Report Status PENDING  Incomplete  Body fluid culture w Gram Stain     Status: None (Preliminary result)   Collection Time: 08/29/24  2:00 PM   Specimen: Synovium; Body Fluid  Result Value Ref Range Status   Specimen Description SYNOVIAL  Final   Special Requests KNEE JOINT  Final   Gram Stain   Final    ABUNDANT WBC PRESENT, PREDOMINANTLY PMN NO ORGANISMS SEEN Performed at [facility], [address] [place], [place] [address]    Culture PENDING  Incomplete   Report Status PENDING  Incomplete    Radiology Studies: IR ARTHRO ASP OR INJ MAJOR JOINT OR BURSA Result Date: 08/29/2024 INDICATION: 74 year old with MRSA bacteremia, left total knee arthroplasty and left knee pain. Request for a left knee joint effusion. EXAM: ULTRASOUND-GUIDED ASPIRATION OF LEFT KNEE JOINT EFFUSION MEDICATIONS: 1% lidocaine  for local anesthetic ANESTHESIA/SEDATION: None COMPLICATIONS: None immediate. PROCEDURE: Informed written consent was obtained from the patient after a thorough discussion of the procedural risks, benefits and alternatives. All questions were addressed. A timeout was performed prior to the initiation of the procedure. Left knee with slightly flexed. Bandage was placed around the lower thigh. Ultrasound demonstrated a large suprapatellar joint effusion particularly along the lateral aspect. The anterolateral aspect of the knee was prepped with chlorhexidine . Skin was anesthetized with 1% lidocaine . Using ultrasound guidance, a 20 gauge spinal needle was directed into the lateral aspect of the knee joint effusion. Approximately 40 mL of cloudy yellow fluid was removed. Majority of the fluid was  aspirated. Bandage placed over the puncture site. FINDINGS: Large left knee joint effusion particularly along the lateral aspect. 40 mL of cloudy yellow fluid was removed. IMPRESSION: Ultrasound-guided aspiration of the left knee joint effusion. Fluid was sent for analysis. Electronically Signed   By: Juliene Balder M.D.   On: 08/29/2024 14:15   MR Lumbar Spine W Wo Contrast Result Date: 08/29/2024 EXAM: MRI LUMBAR SPINE 08/29/2024 12:00:48 PM TECHNIQUE: Multiplanar multisequence MRI of the lumbar spine was performed without and with the administration of intravenous contrast. 10 mL gadobutrol  (GADAVIST ) 1 MMOL/ML injection 10 mL GADOBUTROL  1 MMOL/ML IV SOLN. COMPARISON: CT abdomen and pelvis 08/28/2024, lumbar spine radiographs 12/07/2015, and MRI of lumbar spine 03/30/2005. CLINICAL HISTORY: 74 year old female with back pain, progressive generalized weakness and falls. FINDINGS: BONES AND ALIGNMENT: Redemonstrated lumbar levocurvature. There is again 6 mm grade 1 anterolisthesis of L4 on L5. There is intervertebral disc height and signal loss throughout the lumbar spine. Normal vertebral body heights. Bone marrow signal is unremarkable. SPINAL CORD: The conus medullaris terminates at the L1-L2 level. No abnormal intradural, conus medullaris or cauda equina nerve root enhancement. SOFT TISSUES: No paraspinal mass. L1-L2: There is disc bulge without significant spinal canal stenosis. There is mild bilateral neural foraminal narrowing. L2-L3: There is disc bulge and bilateral facet arthropathy. There is mild-to-moderate right and mild left neural foraminal narrowing. L3-L4: Bilateral facet arthropathy. No significant spinal canal stenosis. There is mild bilateral facet arthropathy. L4-L5: There is uncovering of the posterior disc margin, disc bulge and bilateral facet arthropathy without significant spinal canal stenosis. Mild bilateral lateral recess narrowing. There is moderate right and mild left neural foraminal  narrowing. L5-S1: There is no significant disc herniation, spinal canal or neural foraminal narrowing. Bilateral facet arthropathy. Degenerative findings have  expectedly progressed since 2006. There are bilateral T1/T2 hyperintense renal foci, see CT abdomen and pelvis for further evaluation. IMPRESSION: 1. Multilevel degenerative changes of the lumbar spine, with redemonstrated grade 1 anterolisthesis of L4 on L5. 2. No high-grade spinal canal stenosis. Up-to moderate neural foraminal stenosis most pronounced at the right L4-L5 level. 3. No abnormal intradural, conus medullaris or cauda equina nerve root enhancement. Electronically signed by: Prentice Spade MD 08/29/2024 01:33 PM EST RP Workstation: [id number]     Scheduled Meds:  enoxaparin  (LOVENOX ) injection  100 mg Subcutaneous BID   polyethylene glycol  17 g Oral Daily   sodium chloride  flush  3 mL Intravenous Q12H   sodium chloride  flush  3 mL Intravenous Q12H   Continuous Infusions:  vancomycin  1,000 mg (08/29/24 1443)     LOS: 4 days   35 minutes with more than 50% spent in reviewing records, counseling patient/family and coordinating care.  Reyes VEAR Gaw, MD [facility] [email or web address] 08/29/2024, 3:07 PM    "

## 2024-08-30 ENCOUNTER — Inpatient Hospital Stay (HOSPITAL_COMMUNITY)

## 2024-08-30 DIAGNOSIS — I2693 Single subsegmental pulmonary embolism without acute cor pulmonale: Secondary | ICD-10-CM | POA: Diagnosis not present

## 2024-08-30 DIAGNOSIS — N189 Chronic kidney disease, unspecified: Secondary | ICD-10-CM

## 2024-08-30 DIAGNOSIS — I82462 Acute embolism and thrombosis of left calf muscular vein: Secondary | ICD-10-CM | POA: Diagnosis not present

## 2024-08-30 DIAGNOSIS — B9562 Methicillin resistant Staphylococcus aureus infection as the cause of diseases classified elsewhere: Secondary | ICD-10-CM | POA: Diagnosis not present

## 2024-08-30 DIAGNOSIS — R627 Adult failure to thrive: Secondary | ICD-10-CM

## 2024-08-30 DIAGNOSIS — I38 Endocarditis, valve unspecified: Secondary | ICD-10-CM

## 2024-08-30 DIAGNOSIS — N179 Acute kidney failure, unspecified: Secondary | ICD-10-CM

## 2024-08-30 DIAGNOSIS — R7881 Bacteremia: Secondary | ICD-10-CM | POA: Diagnosis not present

## 2024-08-30 DIAGNOSIS — T8454XD Infection and inflammatory reaction due to internal left knee prosthesis, subsequent encounter: Secondary | ICD-10-CM | POA: Diagnosis not present

## 2024-08-30 DIAGNOSIS — R7401 Elevation of levels of liver transaminase levels: Secondary | ICD-10-CM

## 2024-08-30 DIAGNOSIS — I82452 Acute embolism and thrombosis of left peroneal vein: Secondary | ICD-10-CM | POA: Diagnosis not present

## 2024-08-30 LAB — CULTURE, BLOOD (ROUTINE X 2): Special Requests: ADEQUATE

## 2024-08-30 LAB — ECHOCARDIOGRAM COMPLETE
AR max vel: 2.04 cm2
AV Area VTI: 2.28 cm2
AV Area mean vel: 1.9 cm2
AV Mean grad: 6 mmHg
AV Peak grad: 8.2 mmHg
Ao pk vel: 1.43 m/s
Area-P 1/2: 3.93 cm2
MV VTI: 2.69 cm2
S' Lateral: 3.5 cm
Single Plane A4C EF: 52.4 %
Weight: 3619.07 [oz_av]

## 2024-08-30 LAB — CBC
HCT: 28.3 % — ABNORMAL LOW (ref 36.0–46.0)
Hemoglobin: 8.4 g/dL — ABNORMAL LOW (ref 12.0–15.0)
MCH: 24.9 pg — ABNORMAL LOW (ref 26.0–34.0)
MCHC: 29.7 g/dL — ABNORMAL LOW (ref 30.0–36.0)
MCV: 84 fL (ref 80.0–100.0)
Platelets: 388 10*3/uL (ref 150–400)
RBC: 3.37 MIL/uL — ABNORMAL LOW (ref 3.87–5.11)
RDW: 16.6 % — ABNORMAL HIGH (ref 11.5–15.5)
WBC: 8.1 10*3/uL (ref 4.0–10.5)
nRBC: 0 % (ref 0.0–0.2)

## 2024-08-30 LAB — COMPREHENSIVE METABOLIC PANEL WITH GFR
ALT: 85 U/L — ABNORMAL HIGH (ref 0–44)
AST: 70 U/L — ABNORMAL HIGH (ref 15–41)
Albumin: 2.9 g/dL — ABNORMAL LOW (ref 3.5–5.0)
Alkaline Phosphatase: 183 U/L — ABNORMAL HIGH (ref 38–126)
Anion gap: 13 (ref 5–15)
BUN: 42 mg/dL — ABNORMAL HIGH (ref 8–23)
CO2: 20 mmol/L — ABNORMAL LOW (ref 22–32)
Calcium: 9.3 mg/dL (ref 8.9–10.3)
Chloride: 111 mmol/L (ref 98–111)
Creatinine, Ser: 2 mg/dL — ABNORMAL HIGH (ref 0.44–1.00)
GFR, Estimated: 26 mL/min — ABNORMAL LOW
Glucose, Bld: 98 mg/dL (ref 70–99)
Potassium: 4.7 mmol/L (ref 3.5–5.1)
Sodium: 144 mmol/L (ref 135–145)
Total Bilirubin: 0.3 mg/dL (ref 0.0–1.2)
Total Protein: 7.4 g/dL (ref 6.5–8.1)

## 2024-08-30 MED ORDER — VANCOMYCIN VARIABLE DOSE PER UNSTABLE RENAL FUNCTION (PHARMACIST DOSING): Status: DC

## 2024-08-30 MED ORDER — DAPTOMYCIN-SODIUM CHLORIDE 700-0.9 MG/100ML-% IV SOLN
700.0000 mg | Freq: Every day | INTRAVENOUS | Status: DC
  Administered 2024-08-30 – 2024-09-02 (×3): 700 mg via INTRAVENOUS
  Filled 2024-08-30 (×4): qty 100

## 2024-08-30 NOTE — TOC Progression Note (Addendum)
 Transition of Care [facility]) - Progression Note    Patient Details  Name: Alison Whitehead MRN: [id number] Date of Birth: [birth year]  [facility]) CM/SW Contact  Bridget Cordella Simmonds, LCSW Phone Number: 08/30/2024, 10:33 AM  Clinical Narrative:  CSW spoke with pt daughter Alison Whitehead regarding bed offers: she will accept offer at [facility].  CMA Natalie to submit [employer].    Per Tanya/[facility] notified.  Expected Discharge Plan: Skilled Nursing Facility Barriers to Discharge: English As A Second Language Teacher, Continued Medical Work up, No SNF bed               Expected Discharge Plan and Services In-house Referral: Clinical Social Work   Post Acute Care Choice: Skilled Nursing Facility Living arrangements for the past 2 months: Apartment, Skilled Nursing Facility                                       Social Drivers of Health (SDOH) Interventions SDOH Screenings   Food Insecurity: Patient Unable To Answer (08/27/2024)  Housing: Low Risk (08/27/2024)  Transportation Needs: No Transportation Needs (08/04/2024)  Utilities: Not At Risk (08/04/2024)  Depression (PHQ2-9): Low Risk (07/02/2022)  Social Connections: Unknown (08/04/2024)  Recent Concern: Social Connections - Moderately Isolated (07/18/2024)  Tobacco Use: Medium Risk (08/25/2024)    Readmission Risk Interventions    12/28/2022    1:24 PM  Readmission Risk Prevention Plan  Post Dischage Appt Complete  Medication Screening Not Complete  Medication Screening Not Complete Comment N/A  Transportation Screening Not Complete  Transportation Screening Comment N/A

## 2024-08-30 NOTE — Progress Notes (Signed)
 "                                                                                                                                                         Daily Progress Note   Patient Name: Alison Whitehead       Date: 08/30/2024 DOB: 11/29/1950  Age: 74 y.o. MRN#: [id number] Attending Physician: Maranda Lonni MATSU, MD Primary Care Physician: [name], PA-C Admit Date: 08/25/2024  Reason for Consultation/Follow-up: Establishing goals of care  Subjective: Patient reports pain and dyspnea.  Per MAR, received IV Dilaudid  about 30 minutes prior to my arrival.  She did not recall meeting me for initial goals of care discussion in the ED.  She does however recall general role of palliative medicine team.  Provided review of previous decisions she made last week including DNR/DNI/no escalation of care, no feeding tube or dialysis.  She is confused and unable to tell me what this means today.  Attempted to explore her thoughts and feelings but without success.  She did not respond to most of my statements and attempts to educate, then when I attempted to elicit a response she stated I hear you.  I then called her daughter [name] to provide update on the above interaction.  She is appreciative of efforts and concerned for patient's altered mentation.  Physical Exam Vitals and nursing note reviewed.  Constitutional:      General: She is not in acute distress.    Appearance: She is ill-appearing.  HENT:     Head: Normocephalic and atraumatic.  Cardiovascular:     Rate and Rhythm: Normal rate.  Pulmonary:     Effort: Tachypnea present.  Skin:    General: Skin is warm and dry.  Neurological:     Mental Status: She is alert. She is disoriented.     Comments: Oriented to self and location only, states year is 2025 and does not recall reason for admission  Psychiatric:     Comments: Repeated phrase oh my God rather than appropriate responses to questions           Vital  Signs: BP 114/84 (BP Location: Left Arm)   Pulse 97   Temp 99.1 F (37.3 C) (Oral)   Resp 20   Wt 102.6 kg   SpO2 94%   BMI 35.43 kg/m  SpO2: SpO2: 94 % O2 Device: O2 Device: Nasal Cannula O2 Flow Rate: O2 Flow Rate (L/min): 2 L/min      Palliative Assessment/Data: TBD   Palliative Care Assessment & Plan   Patient Profile: 74 y.o. female  with past medical history of CKD IIIb, HTN, HLD, anemia of chronic disease, prediabetic, chronic pancreatic insufficiency, chronic knee pain admitted on 08/25/2024 with fall at home, poor oral intake, worsening weakness.    In the ED,  patient was found to have AKI on CKD 3B likely due to dehydration, hyperkalemia, elbow pain due to her fall.  X-ray was reviewed, notable for some posterior swelling but no acute fracture.  Creatinine at 2.59, up from 1.26 on 1/4.  Mild leukocytosis and anemia.  She is being admitted for failure to thrive and therapies with anticipation of need to return to SNF/rehab.   Patient has had 2 admissions in the past 6 months for similar presentations, most recently less than 30 days ago.  PMT has been consulted to assist with goals of care conversation.  Assessment: Goals of care conversation MRSA bacteremia DVT/PE FTT  Recommendations/Plan: Continue DNR/DNI Patient is unable to participate in goals of care discussion today.  Per previous discussion, if she declines she would not want escalation of care to the ICU.  No feeding tube or dialysis Patient's daughter was updated again today Psychosocial and emotional support provided Outpatient palliative care referral previously requested PMT will continue to follow and support intermittently   Prognosis: Worrisome  Discharge Planning: Skilled Nursing Facility for rehab with Palliative care service follow-up  Care plan was discussed with patient's daughter, patient   Ein Rijo SHAUNNA Fell, PA-C  Palliative Medicine Team Team phone # [phone]  Thank you for  allowing the Palliative Medicine Team to assist in the care of this patient. Please utilize secure chat with additional questions, if there is no response within 30 minutes please call the above phone number.  Palliative Medicine Team providers are available by phone from 7am to 7pm daily and can be reached through the team cell phone.  Should this patient require assistance outside of these hours, please call the patient's attending physician.   I personally spent a total of 25 minutes in the care of the patient today including preparing to see the patient, getting/reviewing separately obtained history, counseling and educating, and documenting clinical information in the EHR.  "

## 2024-08-30 NOTE — Progress Notes (Signed)
 Lab called and informed me that the fluid speciman that was sent on this pt came back rare growth of staph.  Dr Maranda was made aware via text.  No change in POC at this time

## 2024-08-30 NOTE — Progress Notes (Signed)
 Occupational Therapy Treatment Patient Details Name: Alison Whitehead MRN: [id number] DOB: [birth year] Today's Date: 08/30/2024   History of present illness Pt is a 74 y.o. F who presents 08/25/2024 with progressive weakness, falls, poor p.o. intake. Recent d/c from SNF. PMH includes CKD III, HTN, HLD, prediabetes.   OT comments  Pt continue to demo decreased command following/cognition for initiation of activity limiting functional progress at this time. Pt was premedicated with IV pain medication prior to session. Pt with constant/frequent vocalizations of pain, yelling and moaning throughout session stating No, I can't and that pain is all over. Pt required total A +2 using pad pads underneath pt to sit EOB leaning/pushing back on therapist. OT attempted multiple times to get pt to participate in grooming/hygiene tasks seated EOB however pt constantly stating No, I can't and mostly refusing to grasp washcloth to initiate washing her face. Pt did take washcloth from therapist's hand at one point but immediatley dropped washcloth onto bed. OT will continue efforts to maximize level of function and safety      If plan is discharge home, recommend the following:  Two people to help with walking and/or transfers;Two people to help with bathing/dressing/bathroom;Assistance with cooking/housework;Direct supervision/assist for medications management;Direct supervision/assist for financial management;Assist for transportation;Supervision due to cognitive status   Equipment Recommendations  Other (comment) (defer)    Recommendations for Other Services      Precautions / Restrictions Precautions Precautions: Fall Recall of Precautions/Restrictions: Impaired Restrictions Weight Bearing Restrictions Per Provider Order: No RLE Weight Bearing Per Provider Order: Weight bearing as tolerated       Mobility Bed Mobility Overal bed mobility: Needs Assistance Bed Mobility: Supine to Sit, Sit  to Supine     Supine to sit: Total assist, +2 for physical assistance Sit to supine: Total assist, +2 for physical assistance   General bed mobility comments: No initiation by pt despite multimodal cueing    Transfers                   General transfer comment: Pt deferred     Balance Overall balance assessment: Needs assistance Sitting-balance support: Feet supported Sitting balance-Leahy Scale: Poor Sitting balance - Comments: Leaning back on therapist, posterior lean Postural control: Posterior lean                                 ADL either performed or assessed with clinical judgement   ADL                                         General ADL Comments: OT attempted multiple times to get pt to participate in grooming/hygiene tasks seated EOB however pt constantly stating No, I can't and mostly refusing to grasp washcloth to initiate washing her face. Pt did take washcloth from therapist's hand at one point but immediately dropped washcloth onto bed    Extremity/Trunk Assessment Upper Extremity Assessment Upper Extremity Assessment: Generalized weakness   Lower Extremity Assessment Lower Extremity Assessment: Defer to PT evaluation        Vision Patient Visual Report: No change from baseline     Perception     Praxis     Communication Communication Communication: No apparent difficulties   Cognition Arousal: Alert Behavior During Therapy: Restless, Anxious  Following commands: Impaired Following commands impaired: Follows one step commands inconsistently      Cueing   Cueing Techniques: Verbal cues, Tactile cues  Exercises      Shoulder Instructions       General Comments      Pertinent Vitals/ Pain       Pain Assessment Pain Assessment: PAINAD Breathing: occasional labored breathing, short period of hyperventilation Negative Vocalization: repeated troubled  calling out, loud moaning/groaning, crying Facial Expression: facial grimacing Body Language: tense, distressed pacing, fidgeting Consolability: unable to console, distract or reassure PAINAD Score: 8 Pain Location: LEs, back, all over per pt report Pain Descriptors / Indicators: Grimacing, Guarding, Spasm, Moaning Pain Intervention(s): Limited activity within patient's tolerance, Premedicated before session, Repositioned  Home Living                                          Prior Functioning/Environment              Frequency  Min 2X/week        Progress Toward Goals  OT Goals(current goals can now be found in the care plan section)  Progress towards OT goals: OT to reassess next treatment     Plan      Co-evaluation    PT/OT/SLP Co-Evaluation/Treatment: Yes Reason for Co-Treatment: Complexity of the patient's impairments (multi-system involvement);For patient/therapist safety;To address functional/ADL transfers;Necessary to address cognition/behavior during functional activity PT goals addressed during session: Mobility/safety with mobility OT goals addressed during session: ADL's and self-care      AM-PAC OT 6 Clicks Daily Activity     Outcome Measure   Help from another person eating meals?: None Help from another person taking care of personal grooming?: A Little Help from another person toileting, which includes using toliet, bedpan, or urinal?: Total Help from another person bathing (including washing, rinsing, drying)?: A Lot Help from another person to put on and taking off regular upper body clothing?: A Lot Help from another person to put on and taking off regular lower body clothing?: Total 6 Click Score: 13    End of Session    OT Visit Diagnosis: Unsteadiness on feet (R26.81);Other abnormalities of gait and mobility (R26.89);History of falling (Z91.81);Pain Pain - part of body: Knee (back, all over)   Activity Tolerance  Patient limited by pain   Patient Left in bed;with call bell/phone within reach;with bed alarm set;with SCD's reapplied   Nurse Communication Mobility status        Time: 8941-8875 OT Time Calculation (min): 26 min  Charges: OT General Charges $OT Visit: 1 Visit OT Treatments $Therapeutic Activity: 8-22 mins    Jacques Karna Loose 08/30/2024, 1:17 PM

## 2024-08-30 NOTE — Progress Notes (Addendum)
 " PROGRESS NOTE    Alison Whitehead  FMW:[id number] DOB: 27-Aug-1950 DOA: 08/25/2024 PCP: [name], PA-C  Subjective: Patient seen while echo tech attempting to perform 2D echocardiogram.  Patient complaining of pain in her back when she has the probe on her chest.  Asked RN to give Dilaudid  1 mg IV x 1 to help facilitate the echocardiogram.  Hospital Course: Alison Whitehead  is a 74 y.o Female with extensive history of CKD IIIb, HTN, HLD, anemia of chronic disease, prediabetic, chronic pancreatic insufficiency... Multiple admission for worsening kidney function, failure to thrive, knee pain, confusion was discharged to SNF for rehab home now back again to ED due to progressive generalized weakness, falls, poor p.o. intake.. Denies having any fever or chills nausea or vomiting, or dysuria.  Recent hospitalizations for similar complaint generalized weakness, spontaneous falls, poor p.o. intake, AKIs Recent hospitalization discharge 08/04/24 - 08/09/24 Previous to that 07/12/2024 -07/26/2024  ED Evaluation: Blood pressure 111/71, pulse (!) 103, temperature 99 F (37.2 C), temperature source Oral, resp. rate (!) 21, SpO2 97%. LABs: WBC 11.7, hemoglobin 8.2, potassium 5.7, CO2 18, BUN 63, creatinine 2.59,  Requested for patient to admitted for failure to thrive, progressive generalized weakness, acute on chronic kidney disease, hyperkalemia.       Assessment and Plan:  DVT/PE:   - CTA 1/25 evening revealed small subsegmental PE's BL - already on full dose lovenox . Will tx to eliquis prior to DC.  - pain prior to admission in Left knee makes it concerning she's had this DVT for a while.  Has been on PPX heparin  throughout hospital this admit   MRSA bacteremia/sepsis based on SIRS criteria - ordered after she spike a fever 1/23 - 2/3 bottles now reportedly growing MRSA - starting vanco, ID autoconsulted - appreciate ID input  - TTE pending May need TEE based on  results.   Fever:  - new 1/23 evening - reason for initial blood cultures.   - Previously  on cefepime  for concern for HAP -- await CTA and stop cefepime  if no evidence of PNA.  ID stopped cefepime , some strandy airspace disease of R middle lobe.   - Low threshold to restart more broad based antibiotics if she decompensates or white count continues to rise after stopping her cefepime  1/25   Low back pain: - Subjectively, her main issue.  Describes exquisite back pain with spasms. - Was being controlled on Dilaudid , switched to morphine  but this did not control pain and she is being switched back to Dilaudid . - Also on baclofen . - MRI back obtained and no evidence of seeding to paraspinal muscles nor vertebral spaces/discitis   Failure to thrive, with progressive generalized weaknesses, with frequent falls - Frequent readmissions.   - Consulted PT OT--> recommending SNF. - Recent admit and discharge to SNF - Patient likely need longer SNF stay for rehab - Fall precautions - Appreciate palliative input--> unable to reach family.   Hyperkalemia : -Now resolved. - Will trend.   Transaminitis: - Unclear cause, multifactorial possible etiologies. -Now found to have MRSA bacteremia plus bilateral small PEs. - Will continue to trend, fortunately LFTs downtrending.   AKI on CKD IIIb -AKI has resolved.  Baseline Cr 1.3-1.5  Cr 2.59 hold aspirin , furosemide , olmesartan  metformin - Avoid hypotension and nephrotoxic agents. Continues to improve. -Leading to metabolic acidosis. - Continue to trend creatinine in light of contrast dye she received for CT angiogram as well as being on vancomycin . -creat 2.00 --> vanco d/c'ed. Follow.  Chronic right knee osteoarthritis/left knee pain - During last hospitalization, status post arthrocentesis yielding 50 cc of fluid, with negative fluid analysis.  No signs of infection - Now complaining more of left knee pain - IR able to aspirate 40 cc of  fluid from left knee effusion.  Cell counts consistent with infectious etiology ( wbc 27,300, 93% PMN's, no crystals) culture with rare Staphylococcus aureus. Creat 2 now--> vanc on hold, now d/c;'ed starting daptomycin  08/30/24 per ID. -septic arthritis__> Lknee--> for washout 08/31/24    Anemia of CKD Anemia of chronic disease, due to CKD Stable, obtaining iron studies   Hypertension Continue: Amlodipine , Coreg ,  Holding Lasix  and olmesartan .   Prediabetic Hemoglobin A1c only 5.8. Monitoring CBC closely   Class 2 obesity Last weight 107.4 kg - Resume Ozempic  at discharge.   Pancreatic insufficiency - Continue Creon    Ascending thoracic aortic aneurysm: - Found on CT angiogram yesterday.  Measuring 4.8 cm.  Recommend semiannual follow-up by CTA/MRA and outpatient referral to cardiothoracic surgery. -Please include this on discharge summary as well.   DVT prophylaxis:  SCDs Start: 08/25/24 1431   Code Status:   Code Status: Limited: Do not attempt resuscitation (DNR) -DNR-LIMITED -Do Not Intubate/DNI  Level of care: Telemetry Status is: Inpatient      DVT prophylaxis: SCDs Start: 08/25/24 1431  SCDs   Code Status: Limited: Do not attempt resuscitation (DNR) -DNR-LIMITED -Do Not Intubate/DNI  Family Communication: No one at bedside Disposition Plan: Unclear, may need prolonged course of IV antibiotics Reason for continuing need for hospitalization: MRSA bacteremia under investigation  Objective: Vitals:   08/29/24 1318 08/29/24 2101 08/30/24 0223 08/30/24 0748  BP: (!) 132/90 116/82 102/84 114/84  Pulse: (!) 109 (!) 102 97 97  Resp: 18 16 16 20   Temp: 98.9 F (37.2 C) (!) 97.5 F (36.4 C) 97.6 F (36.4 C) 99.1 F (37.3 C)  TempSrc: Oral Oral  Oral  SpO2: 97% 98% 94% 94%  Weight:        Intake/Output Summary (Last 24 hours) at 08/30/2024 1411 Last data filed at 08/30/2024 0400 Gross per 24 hour  Intake --  Output 1000 ml  Net -1000 ml   Filed Weights    08/27/24 0500 08/28/24 0500  Weight: 100.7 kg 102.6 kg    Examination: Patient seen in conjunction with the echo tech (echo ongoing). Normal work of breathing Follows simple commands moves all extremities Abdomen is soft Extremities are warm and well-perfused no obvious edema    Data Reviewed: I have personally reviewed following labs and imaging studies  CBC: Recent Labs  Lab 08/26/24 0442 08/27/24 1153 08/28/24 0439 08/29/24 0348 08/30/24 0613  WBC 10.8* 10.1 10.3 10.7* 8.1  HGB 8.0* 8.1* 8.1* 8.3* 8.4*  HCT 26.6* 26.6* 26.2* 27.5* 28.3*  MCV 85.3 83.9 82.4 82.8 84.0  PLT 512* 475* 480* 442* 388   Basic Metabolic Panel: Recent Labs  Lab 08/25/24 1757 08/26/24 0442 08/27/24 1153 08/28/24 0439 08/29/24 0348 08/30/24 0613  NA  --  138 141 140 141 144  K  --  5.4* 4.4 4.3 4.3 4.7  CL  --  107 109 108 109 111  CO2  --  17* 19* 20* 18* 20*  GLUCOSE  --  87 145* 103* 102* 98  BUN  --  48* 33* 31* 28* 42*  CREATININE  --  1.62* 1.29* 1.20* 1.27* 2.00*  CALCIUM   --  9.1 9.1 9.2 9.3 9.3  MG 2.5*  --   --   --   --   --  PHOS 3.9  --   --   --   --   --    GFR: Estimated Creatinine Clearance: 31.3 mL/min (A) (by C-G formula based on SCr of 2 mg/dL (H)). Liver Function Tests: Recent Labs  Lab 08/25/24 1124 08/26/24 0442 08/28/24 0439 08/29/24 0348 08/30/24 0613  AST 16 19 82* 67* 70*  ALT 23 19 66* 72* 85*  ALKPHOS 114 113 175* 180* 183*  BILITOT 0.3 0.4 0.3 0.4 0.3  PROT 8.1 7.6 7.2 7.5 7.4  ALBUMIN  3.6 3.3* 2.9* 3.0* 2.9*   No results for input(s): LIPASE, AMYLASE in the last 168 hours. Recent Labs  Lab 08/28/24 1105  AMMONIA 16   Coagulation Profile: No results for input(s): INR, PROTIME in the last 168 hours. Cardiac Enzymes: No results for input(s): CKTOTAL, CKMB, CKMBINDEX, TROPONINI in the last 168 hours. ProBNP, BNP (last 5 results) No results for input(s): PROBNP, BNP in the last 8760 hours. HbA1C: No results for  input(s): HGBA1C in the last 72 hours. CBG: Recent Labs  Lab 08/25/24 1409 08/26/24 0910 08/28/24 0744 08/29/24 0624  GLUCAP 97 93 95 107*   Lipid Profile: No results for input(s): CHOL, HDL, LDLCALC, TRIG, CHOLHDL, LDLDIRECT in the last 72 hours. Thyroid Function Tests: No results for input(s): TSH, T4TOTAL, FREET4, T3FREE, THYROIDAB in the last 72 hours. Anemia Panel: No results for input(s): VITAMINB12, FOLATE, FERRITIN, TIBC, IRON, RETICCTPCT in the last 72 hours. Sepsis Labs: No results for input(s): PROCALCITON, LATICACIDVEN in the last 168 hours.  Recent Results (from the past 240 hours)  Urine Culture (for pregnant, neutropenic or urologic patients or patients with an indwelling urinary catheter)     Status: Abnormal   Collection Time: 08/27/24  7:26 PM   Specimen: Urine, Clean Catch  Result Value Ref Range Status   Specimen Description URINE, CLEAN CATCH  Final   Special Requests   Final    NONE Performed at [facility], [address], [place], [place] [address]    Culture (A)  Final    >=100,000 COLONIES/mL METHICILLIN RESISTANT STAPHYLOCOCCUS AUREUS   Report Status 08/29/2024 FINAL  Final   Organism ID, Bacteria METHICILLIN RESISTANT STAPHYLOCOCCUS AUREUS (A)  Final      Susceptibility   Methicillin resistant staphylococcus aureus - MIC*    CIPROFLOXACIN >=8 RESISTANT Resistant     GENTAMICIN <=0.5 SENSITIVE Sensitive     NITROFURANTOIN <=16 SENSITIVE Sensitive     OXACILLIN >=4 RESISTANT Resistant     TETRACYCLINE <=1 SENSITIVE Sensitive     VANCOMYCIN  1 SENSITIVE Sensitive     TRIMETH/SULFA <=10 SENSITIVE Sensitive     RIFAMPIN <=0.5 SENSITIVE Sensitive     Inducible Clindamycin POSITIVE Resistant     LINEZOLID 2 SENSITIVE Sensitive     * >=100,000 COLONIES/mL METHICILLIN RESISTANT STAPHYLOCOCCUS AUREUS  Culture, blood (Routine X 2) w Reflex to ID Panel     Status: Abnormal (Preliminary result)   Collection  Time: 08/27/24 10:02 PM   Specimen: BLOOD LEFT HAND  Result Value Ref Range Status   Specimen Description BLOOD LEFT HAND  Final   Special Requests   Final    BOTTLES DRAWN AEROBIC AND ANAEROBIC Blood Culture adequate volume   Culture  Setup Time   Final    GRAM POSITIVE COCCI IN BOTH AEROBIC AND ANAEROBIC BOTTLES CRITICAL RESULT CALLED TO, READ BACK BY AND VERIFIED WITH: PHARMD T. DANG [id number] AT 1345, ADC    Culture (A)  Final    METHICILLIN RESISTANT  STAPHYLOCOCCUS AUREUS Sent to [facility] for further susceptibility testing. Performed at [facility], [address] [place], [place] [address]    Report Status PENDING  Incomplete   Organism ID, Bacteria METHICILLIN RESISTANT STAPHYLOCOCCUS AUREUS  Final      Susceptibility   Methicillin resistant staphylococcus aureus - MIC*    CIPROFLOXACIN >=8 RESISTANT Resistant     ERYTHROMYCIN RESISTANT Resistant     GENTAMICIN <=0.5 SENSITIVE Sensitive     OXACILLIN >=4 RESISTANT Resistant     TETRACYCLINE <=1 SENSITIVE Sensitive     VANCOMYCIN  1 SENSITIVE Sensitive     TRIMETH/SULFA <=10 SENSITIVE Sensitive     CLINDAMYCIN RESISTANT Resistant     RIFAMPIN <=0.5 SENSITIVE Sensitive     Inducible Clindamycin POSITIVE Resistant     LINEZOLID 2 SENSITIVE Sensitive     * METHICILLIN RESISTANT STAPHYLOCOCCUS AUREUS  Blood Culture ID Panel (Reflexed)     Status: Abnormal   Collection Time: 08/27/24 10:02 PM  Result Value Ref Range Status   Enterococcus faecalis NOT DETECTED NOT DETECTED Final   Enterococcus Faecium NOT DETECTED NOT DETECTED Final   Listeria monocytogenes NOT DETECTED NOT DETECTED Final   Staphylococcus species DETECTED (A) NOT DETECTED Final    Comment: CRITICAL RESULT CALLED TO, READ BACK BY AND VERIFIED WITH: PHARMD T. DANG [id number] AT 1345, ADC    Staphylococcus aureus (BCID) DETECTED (A) NOT DETECTED Final    Comment: Methicillin (oxacillin)-resistant Staphylococcus aureus (MRSA). MRSA is predictably resistant to  beta-lactam antibiotics (except ceftaroline). Preferred therapy is vancomycin  unless clinically contraindicated. Patient requires contact precautions if  hospitalized. CRITICAL RESULT CALLED TO, READ BACK BY AND VERIFIED WITH: PHARMD T. DANG [id number] AT 1345, ADC    Staphylococcus epidermidis NOT DETECTED NOT DETECTED Final   Staphylococcus lugdunensis NOT DETECTED NOT DETECTED Final   Streptococcus species NOT DETECTED NOT DETECTED Final   Streptococcus agalactiae NOT DETECTED NOT DETECTED Final   Streptococcus pneumoniae NOT DETECTED NOT DETECTED Final   Streptococcus pyogenes NOT DETECTED NOT DETECTED Final   A.calcoaceticus-baumannii NOT DETECTED NOT DETECTED Final   Bacteroides fragilis NOT DETECTED NOT DETECTED Final   Enterobacterales NOT DETECTED NOT DETECTED Final   Enterobacter cloacae complex NOT DETECTED NOT DETECTED Final   Escherichia coli NOT DETECTED NOT DETECTED Final   Klebsiella aerogenes NOT DETECTED NOT DETECTED Final   Klebsiella oxytoca NOT DETECTED NOT DETECTED Final   Klebsiella pneumoniae NOT DETECTED NOT DETECTED Final   Proteus species NOT DETECTED NOT DETECTED Final   Salmonella species NOT DETECTED NOT DETECTED Final   Serratia marcescens NOT DETECTED NOT DETECTED Final   Haemophilus influenzae NOT DETECTED NOT DETECTED Final   Neisseria meningitidis NOT DETECTED NOT DETECTED Final   Pseudomonas aeruginosa NOT DETECTED NOT DETECTED Final   Stenotrophomonas maltophilia NOT DETECTED NOT DETECTED Final   Candida albicans NOT DETECTED NOT DETECTED Final   Candida auris NOT DETECTED NOT DETECTED Final   Candida glabrata NOT DETECTED NOT DETECTED Final   Candida krusei NOT DETECTED NOT DETECTED Final   Candida parapsilosis NOT DETECTED NOT DETECTED Final   Candida tropicalis NOT DETECTED NOT DETECTED Final   Cryptococcus neoformans/gattii NOT DETECTED NOT DETECTED Final   Meth resistant mecA/C and MREJ DETECTED (A) NOT DETECTED Final    Comment: CRITICAL RESULT  CALLED TO, READ BACK BY AND VERIFIED WITH: PHARMD T. DANG [id number] AT 1345, ADC Performed at [facility], [address], [place], [place] [address]   MIC (1 Drug)-  Status: None (Preliminary result)   Collection Time: 08/27/24 10:02 PM  Result Value Ref Range Status   Min Inhibitory Conc (1 Drug) PENDING  Incomplete   Source DAPTOMYCIN  MIC MRSA BLOOD CULTURE  Final    Comment: Performed at [facility], [address] [place], [place] [address]  Culture, blood (Routine X 2) w Reflex to ID Panel     Status: Abnormal   Collection Time: 08/27/24 10:03 PM   Specimen: BLOOD LEFT ARM  Result Value Ref Range Status   Specimen Description BLOOD LEFT ARM  Final   Special Requests   Final    BOTTLES DRAWN AEROBIC AND ANAEROBIC Blood Culture adequate volume   Culture  Setup Time   Final    GRAM POSITIVE COCCI IN CLUSTERS IN BOTH AEROBIC AND ANAEROBIC BOTTLES CRITICAL VALUE NOTED.  VALUE IS CONSISTENT WITH PREVIOUSLY REPORTED AND CALLED VALUE.    Culture (A)  Final    STAPHYLOCOCCUS AUREUS SUSCEPTIBILITIES PERFORMED ON PREVIOUS CULTURE WITHIN THE LAST 5 DAYS. Performed at [facility], [address] [place], [place] [address]    Report Status 08/30/2024 FINAL  Final  Culture, blood (Routine X 2) w Reflex to ID Panel     Status: None (Preliminary result)   Collection Time: 08/29/24  3:48 AM   Specimen: BLOOD  Result Value Ref Range Status   Specimen Description BLOOD BLOOD LEFT ARM  Final   Special Requests   Final    BOTTLES DRAWN AEROBIC AND ANAEROBIC Blood Culture adequate volume   Culture   Final    NO GROWTH 1 DAY Performed at [facility], [address], [place], [place] [address]    Report Status PENDING  Incomplete  Culture, blood (Routine X 2) w Reflex to ID Panel     Status: None (Preliminary result)   Collection Time: 08/29/24  3:52 AM   Specimen: BLOOD  Result Value Ref Range Status   Specimen Description BLOOD BLOOD LEFT HAND  Final    Special Requests   Final    BOTTLES DRAWN AEROBIC AND ANAEROBIC Blood Culture adequate volume   Culture   Final    NO GROWTH 1 DAY Performed at [facility], [address], [place], [place] [address]    Report Status PENDING  Incomplete  Body fluid culture w Gram Stain     Status: None (Preliminary result)   Collection Time: 08/29/24  2:00 PM   Specimen: Synovium; Body Fluid  Result Value Ref Range Status   Specimen Description SYNOVIAL  Final   Special Requests KNEE JOINT  Final   Gram Stain   Final    ABUNDANT WBC PRESENT, PREDOMINANTLY PMN NO ORGANISMS SEEN    Culture   Final    RARE STAPHYLOCOCCUS AUREUS SUSCEPTIBILITIES TO FOLLOW CRITICAL RESULT CALLED TO, READ BACK BY AND VERIFIED WITH: PHARMD D.FREEMAN AT 1104 ON 08/30/2024 BY T.SAAD. Performed at [facility], [address], [place], [place] [address]    Report Status PENDING  Incomplete     Radiology Studies: IR ARTHRO ASP OR INJ MAJOR JOINT OR BURSA Result Date: 08/29/2024 INDICATION: 74 year old with MRSA bacteremia, left total knee arthroplasty and left knee pain. Request for a left knee joint effusion. EXAM: ULTRASOUND-GUIDED ASPIRATION OF LEFT KNEE JOINT EFFUSION MEDICATIONS: 1% lidocaine  for local anesthetic ANESTHESIA/SEDATION: None COMPLICATIONS: None immediate. PROCEDURE: Informed written consent was obtained from the patient after a thorough discussion of the procedural risks, benefits and alternatives. All  questions were addressed. A timeout was performed prior to the initiation of the procedure. Left knee with slightly flexed. Bandage was placed around the lower thigh. Ultrasound demonstrated a large suprapatellar joint effusion particularly along the lateral aspect. The anterolateral aspect of the knee was prepped with chlorhexidine . Skin was anesthetized with 1% lidocaine . Using ultrasound guidance, a 20 gauge spinal needle was directed into the lateral aspect of the knee joint effusion. Approximately  40 mL of cloudy yellow fluid was removed. Majority of the fluid was aspirated. Bandage placed over the puncture site. FINDINGS: Large left knee joint effusion particularly along the lateral aspect. 40 mL of cloudy yellow fluid was removed. IMPRESSION: Ultrasound-guided aspiration of the left knee joint effusion. Fluid was sent for analysis. Electronically Signed   By: Juliene Balder M.D.   On: 08/29/2024 14:15   MR Lumbar Spine W Wo Contrast Result Date: 08/29/2024 EXAM: MRI LUMBAR SPINE 08/29/2024 12:00:48 PM TECHNIQUE: Multiplanar multisequence MRI of the lumbar spine was performed without and with the administration of intravenous contrast. 10 mL gadobutrol  (GADAVIST ) 1 MMOL/ML injection 10 mL GADOBUTROL  1 MMOL/ML IV SOLN. COMPARISON: CT abdomen and pelvis 08/28/2024, lumbar spine radiographs 12/07/2015, and MRI of lumbar spine 03/30/2005. CLINICAL HISTORY: 74 year old female with back pain, progressive generalized weakness and falls. FINDINGS: BONES AND ALIGNMENT: Redemonstrated lumbar levocurvature. There is again 6 mm grade 1 anterolisthesis of L4 on L5. There is intervertebral disc height and signal loss throughout the lumbar spine. Normal vertebral body heights. Bone marrow signal is unremarkable. SPINAL CORD: The conus medullaris terminates at the L1-L2 level. No abnormal intradural, conus medullaris or cauda equina nerve root enhancement. SOFT TISSUES: No paraspinal mass. L1-L2: There is disc bulge without significant spinal canal stenosis. There is mild bilateral neural foraminal narrowing. L2-L3: There is disc bulge and bilateral facet arthropathy. There is mild-to-moderate right and mild left neural foraminal narrowing. L3-L4: Bilateral facet arthropathy. No significant spinal canal stenosis. There is mild bilateral facet arthropathy. L4-L5: There is uncovering of the posterior disc margin, disc bulge and bilateral facet arthropathy without significant spinal canal stenosis. Mild bilateral lateral recess  narrowing. There is moderate right and mild left neural foraminal narrowing. L5-S1: There is no significant disc herniation, spinal canal or neural foraminal narrowing. Bilateral facet arthropathy. Degenerative findings have expectedly progressed since 2006. There are bilateral T1/T2 hyperintense renal foci, see CT abdomen and pelvis for further evaluation. IMPRESSION: 1. Multilevel degenerative changes of the lumbar spine, with redemonstrated grade 1 anterolisthesis of L4 on L5. 2. No high-grade spinal canal stenosis. Up-to moderate neural foraminal stenosis most pronounced at the right L4-L5 level. 3. No abnormal intradural, conus medullaris or cauda equina nerve root enhancement. Electronically signed by: Prentice Spade MD 08/29/2024 01:33 PM EST RP Workstation: [id number]   CT Angio Chest Pulmonary Embolism (PE) W or WO Contrast Addendum Date: 08/28/2024 ADDENDUM REPORT: 08/28/2024 15:42 ADDENDUM: Critical Value/emergent results were called by telephone at the time of interpretation on 08/28/2024 at 3:40 pm to provider Bari Boards, who verbally acknowledged these results. Electronically Signed   By: Leita Birmingham M.D.   On: 08/28/2024 15:42   Result Date: 08/28/2024 CLINICAL DATA:  Pulmonary embolism suspected, high probability. New hypoxic requirement with tachycardia. Possible DVT. EXAM: CT ANGIOGRAPHY CHEST WITH CONTRAST TECHNIQUE: Multidetector CT imaging of the chest was performed using the standard protocol during bolus administration of intravenous contrast. Multiplanar CT image reconstructions and MIPs were obtained to evaluate the vascular anatomy. RADIATION DOSE REDUCTION: This exam was performed according to the  departmental dose-optimization program which includes automated exposure control, adjustment of the mA and/or kV according to patient size and/or use of iterative reconstruction technique. CONTRAST:  75mL OMNIPAQUE  IOHEXOL  350 MG/ML SOLN COMPARISON:  None Available. FINDINGS:  Cardiovascular: The heart is enlarged and there is a trace pericardial effusion. Scattered coronary artery calcifications are present. There is atherosclerotic calcification of the aorta with aneurysmal dilatation of the ascending aorta measuring 4.8 cm. The pulmonary trunk is distended suggesting underlying pulmonary artery hypertension. Small segmental and subsegmental pulmonary emboli are noted in the lower lobes bilaterally. Examination is limited due to mixing artifact and respiratory motion. Mediastinum/Nodes: No mediastinal, hilar, or axillary lymphadenopathy is seen. The thyroid gland, trachea, and esophagus are within normal limits. Lungs/Pleura: Centrilobular emphysematous changes are present in the lungs. A strandy airspace disease is noted in the infrahilar right middle lobe. Atelectasis is present bilaterally. No effusion or pneumothorax is seen. Upper Abdomen: No acute abnormality. Musculoskeletal: Degenerative changes are noted in the thoracic spine. No acute osseous abnormality is seen. Review of the MIP images confirms the above findings. IMPRESSION: 1. Small segmental and subsegmental pulmonary emboli bilaterally. 2. Strandy airspace disease in the infrahilar region of the right middle lobe, possible atelectasis or infiltrate. 3. Cardiomegaly with coronary artery calcifications. 4. 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 Ascending thoracic aortic aneurysm. Recommend semi-annual imaging followup by CTA or MRA and referral to cardiothoracic surgery if not already obtained. This recommendation follows 2010 ACCF/AHA/AATS/ACR/ASA/SCA/SCAI/SIR/STS/SVM Guidelines for the Diagnosis and Management of Patients With Thoracic Aortic Disease. Circulation. 2010; 121: Z733-z630. Aortic aneurysm NOS (ICD10-I71.9) Electronically Signed: By: Leita Birmingham M.D. On: 08/28/2024 15:20    Scheduled Meds:  enoxaparin  (LOVENOX ) injection  100 mg Subcutaneous BID    polyethylene glycol  17 g Oral Daily   sodium chloride  flush  3 mL Intravenous Q12H   sodium chloride  flush  3 mL Intravenous Q12H   Continuous Infusions:  vancomycin  1,000 mg (08/29/24 1443)     LOS: 5 days   Time spent: 36 minutes  Lonni KANDICE Moose, MD  [facility]  08/30/2024, 2:11 PM   "

## 2024-08-30 NOTE — Progress Notes (Signed)
 Physical Therapy Treatment Patient Details Name: Alison Whitehead MRN: [id number] DOB: June 20, 1951 Today's Date: 08/30/2024   History of Present Illness Pt is a 74 y.o. F who presents 08/25/2024 with progressive weakness, falls, poor p.o. intake. Recent d/c from SNF. PMH includes CKD III, HTN, HLD, prediabetes.    PT Comments  Pt not progressing towards her physical therapy goals. Premedicated with IV pain medication prior to session. Pt with frequent vocalizations of pain, moaning throughout session and stating she has pain all over. Pt is a poor historian and unable to localize or quantify pain. Unable to determine pain vs AMS. Requiring +2 assist for all mobility. Decreased command following and initiation to sit up on edge of bed. Functional progress limited at this time due to cognitive/behavioral barriers.    If plan is discharge home, recommend the following: Two people to help with walking and/or transfers;Two people to help with bathing/dressing/bathroom   Can travel by private vehicle     No  Equipment Recommendations  Hospital bed;Hoyer lift    Recommendations for Other Services       Precautions / Restrictions Precautions Precautions: Fall Recall of Precautions/Restrictions: Impaired Restrictions Weight Bearing Restrictions Per Provider Order: No     Mobility  Bed Mobility Overal bed mobility: Needs Assistance Bed Mobility: Supine to Sit, Sit to Supine     Supine to sit: Total assist, +2 for physical assistance Sit to supine: Total assist, +2 for physical assistance   General bed mobility comments: No initiation by pt despite multimodal cueing    Transfers                   General transfer comment: Pt deferred    Ambulation/Gait                   Stairs             Wheelchair Mobility     Tilt Bed    Modified Rankin (Stroke Patients Only)       Balance Overall balance assessment: Needs assistance Sitting-balance  support: Feet supported Sitting balance-Leahy Scale: Poor Sitting balance - Comments: Leaning back on therapist, posterior lean Postural control: Posterior lean                                  Communication Communication Communication: No apparent difficulties  Cognition Arousal: Alert Behavior During Therapy: Restless, Anxious   PT - Cognitive impairments: No family/caregiver present to determine baseline                       PT - Cognition Comments: Not following commands, internally distracted by pain, repeated calling out Following commands: Impaired Following commands impaired: Follows one step commands inconsistently    Cueing Cueing Techniques: Verbal cues, Tactile cues  Exercises      General Comments        Pertinent Vitals/Pain Pain Assessment Pain Assessment: PAINAD Breathing: occasional labored breathing, short period of hyperventilation Negative Vocalization: repeated troubled calling out, loud moaning/groaning, crying Facial Expression: facial grimacing Body Language: tense, distressed pacing, fidgeting Consolability: unable to console, distract or reassure PAINAD Score: 8    Home Living                          Prior Function            PT Goals (current goals can  now be found in the care plan section) Acute Rehab PT Goals Patient Stated Goal: did not state Potential to Achieve Goals: Poor Progress towards PT goals: Not progressing toward goals - comment    Frequency    Min 1X/week      PT Plan      Co-evaluation PT/OT/SLP Co-Evaluation/Treatment: Yes Reason for Co-Treatment: Complexity of the patient's impairments (multi-system involvement);For patient/therapist safety;To address functional/ADL transfers;Necessary to address cognition/behavior during functional activity PT goals addressed during session: Mobility/safety with mobility        AM-PAC PT 6 Clicks Mobility   Outcome Measure  Help  needed turning from your back to your side while in a flat bed without using bedrails?: Total Help needed moving from lying on your back to sitting on the side of a flat bed without using bedrails?: Total Help needed moving to and from a bed to a chair (including a wheelchair)?: Total Help needed standing up from a chair using your arms (e.g., wheelchair or bedside chair)?: Total Help needed to walk in hospital room?: Total Help needed climbing 3-5 steps with a railing? : Total 6 Click Score: 6    End of Session   Activity Tolerance: Patient limited by pain Patient left: in bed;with call bell/phone within reach;with bed alarm set Nurse Communication: Mobility status PT Visit Diagnosis: Muscle weakness (generalized) (M62.81);Pain Pain - Right/Left: Right Pain - part of body: Knee     Time: 8941-8873 PT Time Calculation (min) (ACUTE ONLY): 28 min  Charges:    $Therapeutic Activity: 8-22 mins PT General Charges $$ ACUTE PT VISIT: 1 Visit                     Aleck Daring, PT, DPT Acute Rehabilitation Services Office [phone]    Aleck ONEIDA Daring 08/30/2024, 1:06 PM

## 2024-08-30 NOTE — Plan of Care (Signed)

## 2024-08-30 NOTE — Progress Notes (Signed)
 Pharmacy Antibiotic Note  Alison Whitehead is a 74 y.o. female admitted on 08/25/2024 with bacteremia.  Pharmacy has been consulted for vancomycin  dosing.  WBC 10.3, afebrile. Scr 1.2 (CrCl 52 mL/min). Bcx 2/3 growing GPC in cluster > BCID showing MRSA.   Plan: DC vanco 1gram dose Dose by randoms Vanco random AM 1/28   Weight: 102.6 kg (226 lb 3.1 oz)  Temp (24hrs), Avg:98.1 F (36.7 C), Min:97.5 F (36.4 C), Max:99.1 F (37.3 C)  Recent Labs  Lab 08/26/24 0442 08/27/24 1153 08/28/24 0439 08/29/24 0348 08/30/24 0613  WBC 10.8* 10.1 10.3 10.7* 8.1  CREATININE 1.62* 1.29* 1.20* 1.27* 2.00*    Estimated Creatinine Clearance: 31.3 mL/min (A) (by C-G formula based on SCr of 2 mg/dL (H)).    Allergies[1]  Antimicrobials this admission: Vancomycin  1/25 >>  Cefepime  1/25 >>   Dose adjustments this admission: N/A  Microbiology results: 1/24 BCx: 3/4 GPC in clusters > BCID MRSA 1/24 UCx: sent   Thank you for allowing pharmacy to participate in this patient's care,   Benedetta Heath BS, PharmD, BCPS Clinical Pharmacist 08/30/2024 2:25 PM  Contact: [phone] after 3 PM    [1]  Allergies Allergen Reactions   Nsaids Other (See Comments)    Contraindication due to CKD    Penicillins Itching

## 2024-08-30 NOTE — Discharge Instructions (Addendum)
 ORTHO Weight bearing as tolerated.  May remove bandages and apply band-aids over portal sites 2 days after surgery.  May shower with band-aids at this point.  Continue antibiotics      Information on my medicine - ELIQUIS  (apixaban )  This medication education was reviewed with me or my healthcare representative as part of my discharge preparation.    Why was Eliquis  prescribed for you? Eliquis  was prescribed to treat blood clots that may have been found in the veins of your legs (deep vein thrombosis) or in your lungs (pulmonary embolism) and to reduce the risk of them occurring again.  What do You need to know about Eliquis  ? The starting dose is 10 mg (two 5 mg tablets) taken TWICE daily for the FIRST SEVEN (7) DAYS, then on  2/2 AM  the dose is reduced to ONE 5 mg tablet taken TWICE daily.  Eliquis  may be taken with or without food.   Try to take the dose about the same time in the morning and in the evening. If you have difficulty swallowing the tablet whole please discuss with your pharmacist how to take the medication safely.  Take Eliquis  exactly as prescribed and DO NOT stop taking Eliquis  without talking to the doctor who prescribed the medication.  Stopping may increase your risk of developing a new blood clot.  Refill your prescription before you run out.  After discharge, you should have regular check-up appointments with your healthcare provider that is prescribing your Eliquis .    What do you do if you miss a dose? If a dose of ELIQUIS  is not taken at the scheduled time, take it as soon as possible on the same day and twice-daily administration should be resumed. The dose should not be doubled to make up for a missed dose.  Important Safety Information A possible side effect of Eliquis  is bleeding. You should call your healthcare provider right away if you experience any of the following: Bleeding from an injury or your nose that does not stop. Unusual colored  urine (red or dark brown) or unusual colored stools (red or black). Unusual bruising for unknown reasons. A serious fall or if you hit your head (even if there is no bleeding).  Some medicines may interact with Eliquis  and might increase your risk of bleeding or clotting while on Eliquis . To help avoid this, consult your healthcare provider or pharmacist prior to using any new prescription or non-prescription medications, including herbals, vitamins, non-steroidal anti-inflammatory drugs (NSAIDs) and supplements.  This website has more information on Eliquis  (apixaban ): [email or web address] .[email or web address] dena

## 2024-08-30 NOTE — Progress Notes (Signed)
 "                                                            [facility] Infectious Diseases Follow Up Note  Patient Identification: Patient Name: Alison Whitehead MRN: [id number] Admit Date: 08/25/2024 10:37 AM Age: 74 y.o.Today's Date: 08/30/2024   Reason for Visit: MRSA bacteremia  Principal Problem:   Failure to thrive (child) Active Problems:   Essential hypertension   ARTHRITIS, KNEE   Right pontine stroke (HCC)   Prediabetes   Generalized weakness   Primary osteoarthritis of right knee   CKD stage 3b, GFR 30-44 ml/min (HCC)   Hyperkalemia   Failure to thrive in adult   Chronic knee pain   Palliative care by specialist   Antibiotics: Vancomycin  1/25-  Lines/Hardwares:   Interval Events: Remains afebrile Labs remarkable for creatinine 2.0, ALP 183, AST 70, ALT 85, hemoglobin 8.4, platelets 388  assessment 75 year old female with prior history of HTN, HLD, CKD, AOCD, prediabetes, chronic pancreatic insufficiency, FTT who presented to the ED with progressive generalized weakness, falls, poor oral intake.  Found to have  # MRSA bacteremia  # Acute DVT left peroneal vein plus acute intramuscular thrombosis involving the left soleal veins + segmental bilateral PE  # Left prosthetic joint infection - 1/26 s/p joint aspiration with WBC 27K, neutrophilic no crystals. Cx  Staphylococcus aureus  # Transaminitis  # AKI on CKD  # FTT # Lower back pain - 1/26 MRI L-spine negative for discitis/osteomyelitis  Recommendations Will change vancomycin  to daptomycin  due to CKD Follow-up repeat blood cultures Follow-up TTE.  TEE if TTE negative Needs surgical intervention for left knee prosthetic joint infection and have messaged Ortho PA Ozell Purchase Will check acute hepatitis panel Contact precautions Following  Rest of the management as per the primary team. Thank you for the consult. Please page with pertinent questions or  concerns.  ______________________________________________________________________ Subjective patient seen and examined at the bedside.  She is thirsty and wanting to drink water .   Past Medical History:  Diagnosis Date   Allergy    seasonal   Arthritis    CKD (chronic kidney disease)    DDD (degenerative disc disease)    Degenerative disc disease, lumbar    Hypertension    IBS (irritable bowel syndrome)    Osteopenia    Osteoporosis    osteopenia   Stroke (HCC) 09/2019   Past Surgical History:  Procedure Laterality Date   IR US  GUIDE BX ASP/DRAIN  08/29/2024   SHOULDER SURGERY Left    lipoma   TOTAL KNEE ARTHROPLASTY Left 12/26/2022   Procedure: LEFT TOTAL KNEE ARTHROPLASTY;  Surgeon: Harden Jerona GAILS, MD;  Location: [facility] OR;  Service: Orthopedics;  Laterality: Left;   Vitals BP 102/84 (BP Location: Left Arm)   Pulse 97   Temp 97.6 F (36.4 C)   Resp 16   Wt 102.6 kg   SpO2 94%   BMI 35.43 kg/m     Physical Exam Constitutional: Awake, alert, nontoxic-appearing    Comments: Oral mucosa moist  Cardiovascular:     Rate and Rhythm: Normal rate    Heart sounds: S1S2  Pulmonary:     Effort: Pulmonary effort is normal.     Comments: Normal breath sounds  Abdominal:     Palpations: Abdomen  is soft.     Tenderness: Nondistended and nontender  Musculoskeletal:        General: No swelling or tenderness other peripheral joints Left knee with swelling, warmth  Skin:    Comments: No rashes   Neurological:     General: Awake, alert, follows commands  Pertinent Microbiology Results for orders placed or performed during the hospital encounter of 08/25/24  Urine Culture (for pregnant, neutropenic or urologic patients or patients with an indwelling urinary catheter)     Status: Abnormal   Collection Time: 08/27/24  7:26 PM   Specimen: Urine, Clean Catch  Result Value Ref Range Status   Specimen Description URINE, CLEAN CATCH  Final   Special Requests   Final     NONE Performed at [facility], [address] [place], [place] [address]    Culture (A)  Final    >=100,000 COLONIES/mL METHICILLIN RESISTANT STAPHYLOCOCCUS AUREUS   Report Status 08/29/2024 FINAL  Final   Organism ID, Bacteria METHICILLIN RESISTANT STAPHYLOCOCCUS AUREUS (A)  Final      Susceptibility   Methicillin resistant staphylococcus aureus - MIC*    CIPROFLOXACIN >=8 RESISTANT Resistant     GENTAMICIN <=0.5 SENSITIVE Sensitive     NITROFURANTOIN <=16 SENSITIVE Sensitive     OXACILLIN >=4 RESISTANT Resistant     TETRACYCLINE <=1 SENSITIVE Sensitive     VANCOMYCIN  1 SENSITIVE Sensitive     TRIMETH/SULFA <=10 SENSITIVE Sensitive     RIFAMPIN <=0.5 SENSITIVE Sensitive     Inducible Clindamycin POSITIVE Resistant     LINEZOLID 2 SENSITIVE Sensitive     * >=100,000 COLONIES/mL METHICILLIN RESISTANT STAPHYLOCOCCUS AUREUS  Culture, blood (Routine X 2) w Reflex to ID Panel     Status: Abnormal (Preliminary result)   Collection Time: 08/27/24 10:02 PM   Specimen: BLOOD LEFT HAND  Result Value Ref Range Status   Specimen Description BLOOD LEFT HAND  Final   Special Requests   Final    BOTTLES DRAWN AEROBIC AND ANAEROBIC Blood Culture adequate volume   Culture  Setup Time   Final    GRAM POSITIVE COCCI IN BOTH AEROBIC AND ANAEROBIC BOTTLES CRITICAL RESULT CALLED TO, READ BACK BY AND VERIFIED WITH: PHARMD T. DANG [id number] AT 1345, ADC    Culture (A)  Final    STAPHYLOCOCCUS AUREUS SUSCEPTIBILITIES TO FOLLOW Performed at [facility], [address], [place], [place] [address]    Report Status PENDING  Incomplete  Blood Culture ID Panel (Reflexed)     Status: Abnormal   Collection Time: 08/27/24 10:02 PM  Result Value Ref Range Status   Enterococcus faecalis NOT DETECTED NOT DETECTED Final   Enterococcus Faecium NOT DETECTED NOT DETECTED Final   Listeria monocytogenes NOT DETECTED NOT DETECTED Final   Staphylococcus species DETECTED (A) NOT DETECTED Final    Comment:  CRITICAL RESULT CALLED TO, READ BACK BY AND VERIFIED WITH: PHARMD T. DANG [id number] AT 1345, ADC    Staphylococcus aureus (BCID) DETECTED (A) NOT DETECTED Final    Comment: Methicillin (oxacillin)-resistant Staphylococcus aureus (MRSA). MRSA is predictably resistant to beta-lactam antibiotics (except ceftaroline). Preferred therapy is vancomycin  unless clinically contraindicated. Patient requires contact precautions if  hospitalized. CRITICAL RESULT CALLED TO, READ BACK BY AND VERIFIED WITH: PHARMD T. DANG [id number] AT 1345, ADC    Staphylococcus epidermidis NOT DETECTED NOT DETECTED Final   Staphylococcus lugdunensis NOT DETECTED NOT DETECTED Final   Streptococcus species NOT DETECTED NOT DETECTED Final   Streptococcus agalactiae NOT DETECTED  NOT DETECTED Final   Streptococcus pneumoniae NOT DETECTED NOT DETECTED Final   Streptococcus pyogenes NOT DETECTED NOT DETECTED Final   A.calcoaceticus-baumannii NOT DETECTED NOT DETECTED Final   Bacteroides fragilis NOT DETECTED NOT DETECTED Final   Enterobacterales NOT DETECTED NOT DETECTED Final   Enterobacter cloacae complex NOT DETECTED NOT DETECTED Final   Escherichia coli NOT DETECTED NOT DETECTED Final   Klebsiella aerogenes NOT DETECTED NOT DETECTED Final   Klebsiella oxytoca NOT DETECTED NOT DETECTED Final   Klebsiella pneumoniae NOT DETECTED NOT DETECTED Final   Proteus species NOT DETECTED NOT DETECTED Final   Salmonella species NOT DETECTED NOT DETECTED Final   Serratia marcescens NOT DETECTED NOT DETECTED Final   Haemophilus influenzae NOT DETECTED NOT DETECTED Final   Neisseria meningitidis NOT DETECTED NOT DETECTED Final   Pseudomonas aeruginosa NOT DETECTED NOT DETECTED Final   Stenotrophomonas maltophilia NOT DETECTED NOT DETECTED Final   Candida albicans NOT DETECTED NOT DETECTED Final   Candida auris NOT DETECTED NOT DETECTED Final   Candida glabrata NOT DETECTED NOT DETECTED Final   Candida krusei NOT DETECTED NOT DETECTED Final    Candida parapsilosis NOT DETECTED NOT DETECTED Final   Candida tropicalis NOT DETECTED NOT DETECTED Final   Cryptococcus neoformans/gattii NOT DETECTED NOT DETECTED Final   Meth resistant mecA/C and MREJ DETECTED (A) NOT DETECTED Final    Comment: CRITICAL RESULT CALLED TO, READ BACK BY AND VERIFIED WITH: PHARMD T. DANG [id number] AT 1345, ADC Performed at [facility], [address] [place], [place] [address]   Culture, blood (Routine X 2) w Reflex to ID Panel     Status: Abnormal (Preliminary result)   Collection Time: 08/27/24 10:03 PM   Specimen: BLOOD LEFT ARM  Result Value Ref Range Status   Specimen Description BLOOD LEFT ARM  Final   Special Requests   Final    BOTTLES DRAWN AEROBIC AND ANAEROBIC Blood Culture adequate volume   Culture  Setup Time   Final    GRAM POSITIVE COCCI IN CLUSTERS IN BOTH AEROBIC AND ANAEROBIC BOTTLES CRITICAL VALUE NOTED.  VALUE IS CONSISTENT WITH PREVIOUSLY REPORTED AND CALLED VALUE. Performed at [facility], [address], [place], [place] [address]    Culture STAPHYLOCOCCUS AUREUS (A)  Final   Report Status PENDING  Incomplete  Culture, blood (Routine X 2) w Reflex to ID Panel     Status: None (Preliminary result)   Collection Time: 08/29/24  3:48 AM   Specimen: BLOOD  Result Value Ref Range Status   Specimen Description BLOOD BLOOD LEFT ARM  Final   Special Requests   Final    BOTTLES DRAWN AEROBIC AND ANAEROBIC Blood Culture adequate volume   Culture   Final    NO GROWTH < 12 HOURS Performed at [facility], [address] [place], [place] [address]    Report Status PENDING  Incomplete  Culture, blood (Routine X 2) w Reflex to ID Panel     Status: None (Preliminary result)   Collection Time: 08/29/24  3:52 AM   Specimen: BLOOD  Result Value Ref Range Status   Specimen Description BLOOD BLOOD LEFT HAND  Final   Special Requests   Final    BOTTLES DRAWN AEROBIC AND ANAEROBIC Blood Culture adequate volume   Culture    Final    NO GROWTH < 12 HOURS Performed at [facility], [address], [place], [place] [address]    Report Status PENDING  Incomplete  Body fluid culture w Gram Stain     Status: None (Preliminary result)   Collection Time: 08/29/24  2:00 PM   Specimen: Synovium; Body Fluid  Result Value Ref Range Status   Specimen Description SYNOVIAL  Final   Special Requests KNEE JOINT  Final   Gram Stain   Final    ABUNDANT WBC PRESENT, PREDOMINANTLY PMN NO ORGANISMS SEEN Performed at [facility], [address], [place], [place] [address]    Culture PENDING  Incomplete   Report Status PENDING  Incomplete   Pertinent Lab.    Latest Ref Rng & Units 08/29/2024    3:48 AM 08/28/2024    4:39 AM 08/27/2024   11:53 AM  CBC  WBC 4.0 - 10.5 K/uL 10.7  10.3  10.1   Hemoglobin 12.0 - 15.0 g/dL 8.3  8.1  8.1   Hematocrit 36.0 - 46.0 % 27.5  26.2  26.6   Platelets 150 - 400 K/uL 442  480  475       Latest Ref Rng & Units 08/29/2024    3:48 AM 08/28/2024    4:39 AM 08/27/2024   11:53 AM  CMP  Glucose 70 - 99 mg/dL 897  896  854   BUN 8 - 23 mg/dL 28  31  33   Creatinine 0.44 - 1.00 mg/dL 8.72  8.79  8.70   Sodium 135 - 145 mmol/L 141  140  141   Potassium 3.5 - 5.1 mmol/L 4.3  4.3  4.4   Chloride 98 - 111 mmol/L 109  108  109   CO2 22 - 32 mmol/L 18  20  19    Calcium  8.9 - 10.3 mg/dL 9.3  9.2  9.1   Total Protein 6.5 - 8.1 g/dL 7.5  7.2    Total Bilirubin 0.0 - 1.2 mg/dL 0.4  0.3    Alkaline Phos 38 - 126 U/L 180  175    AST 15 - 41 U/L 67  82    ALT 0 - 44 U/L 72  66      Pertinent Imaging today Plain films and CT images have been personally visualized and interpreted; radiology reports have been reviewed. Decision making incorporated into the Impression /   IR ARTHRO ASP OR INJ MAJOR JOINT OR BURSA Result Date: 08/29/2024 INDICATION: 74 year old with MRSA bacteremia, left total knee arthroplasty and left knee pain. Request for a left knee joint effusion. EXAM:  ULTRASOUND-GUIDED ASPIRATION OF LEFT KNEE JOINT EFFUSION MEDICATIONS: 1% lidocaine  for local anesthetic ANESTHESIA/SEDATION: None COMPLICATIONS: None immediate. PROCEDURE: Informed written consent was obtained from the patient after a thorough discussion of the procedural risks, benefits and alternatives. All questions were addressed. A timeout was performed prior to the initiation of the procedure. Left knee with slightly flexed. Bandage was placed around the lower thigh. Ultrasound demonstrated a large suprapatellar joint effusion particularly along the lateral aspect. The anterolateral aspect of the knee was prepped with chlorhexidine . Skin was anesthetized with 1% lidocaine . Using ultrasound guidance, a 20 gauge spinal needle was directed into the lateral aspect of the knee joint effusion. Approximately 40 mL of cloudy yellow fluid was removed. Majority of the fluid was aspirated. Bandage placed over the puncture site. FINDINGS: Large left knee joint effusion particularly along the lateral aspect. 40 mL of cloudy yellow fluid was removed. IMPRESSION: Ultrasound-guided aspiration of the left knee joint effusion. Fluid was sent for analysis. Electronically Signed   By: Juliene Balder M.D.   On: 08/29/2024  14:15   MR Lumbar Spine W Wo Contrast Result Date: 08/29/2024 EXAM: MRI LUMBAR SPINE 08/29/2024 12:00:48 PM TECHNIQUE: Multiplanar multisequence MRI of the lumbar spine was performed without and with the administration of intravenous contrast. 10 mL gadobutrol  (GADAVIST ) 1 MMOL/ML injection 10 mL GADOBUTROL  1 MMOL/ML IV SOLN. COMPARISON: CT abdomen and pelvis 08/28/2024, lumbar spine radiographs 12/07/2015, and MRI of lumbar spine 03/30/2005. CLINICAL HISTORY: 74 year old female with back pain, progressive generalized weakness and falls. FINDINGS: BONES AND ALIGNMENT: Redemonstrated lumbar levocurvature. There is again 6 mm grade 1 anterolisthesis of L4 on L5. There is intervertebral disc height and signal loss  throughout the lumbar spine. Normal vertebral body heights. Bone marrow signal is unremarkable. SPINAL CORD: The conus medullaris terminates at the L1-L2 level. No abnormal intradural, conus medullaris or cauda equina nerve root enhancement. SOFT TISSUES: No paraspinal mass. L1-L2: There is disc bulge without significant spinal canal stenosis. There is mild bilateral neural foraminal narrowing. L2-L3: There is disc bulge and bilateral facet arthropathy. There is mild-to-moderate right and mild left neural foraminal narrowing. L3-L4: Bilateral facet arthropathy. No significant spinal canal stenosis. There is mild bilateral facet arthropathy. L4-L5: There is uncovering of the posterior disc margin, disc bulge and bilateral facet arthropathy without significant spinal canal stenosis. Mild bilateral lateral recess narrowing. There is moderate right and mild left neural foraminal narrowing. L5-S1: There is no significant disc herniation, spinal canal or neural foraminal narrowing. Bilateral facet arthropathy. Degenerative findings have expectedly progressed since 2006. There are bilateral T1/T2 hyperintense renal foci, see CT abdomen and pelvis for further evaluation. IMPRESSION: 1. Multilevel degenerative changes of the lumbar spine, with redemonstrated grade 1 anterolisthesis of L4 on L5. 2. No high-grade spinal canal stenosis. Up-to moderate neural foraminal stenosis most pronounced at the right L4-L5 level. 3. No abnormal intradural, conus medullaris or cauda equina nerve root enhancement. Electronically signed by: Prentice Spade MD 08/29/2024 01:33 PM EST RP Workstation: [id number]   VAS US  LOWER EXTREMITY VENOUS (DVT) Result Date: 08/28/2024  Lower Venous DVT Study Patient Name:  [facility]  Date of Exam:   08/28/2024 Medical Rec #: [id number]             Accession #:    [id number] Date of Birth: 1951-02-06            Patient Gender: F Patient Age:   49 years Exam Location:  [facility]  Procedure:      VAS US  LOWER EXTREMITY VENOUS (DVT) Referring Phys: JEFFREY WALDEN --------------------------------------------------------------------------------  Indications: Possible DVT seen on CT abd/pelvis.  Limitations: Poor ultrasound/tissue interface. Comparison Study: No previous exams Performing Technologist: Jody Hill RVT, RDMS  Examination Guidelines: A complete evaluation includes B-mode imaging, spectral Doppler, color Doppler, and power Doppler as needed of all accessible portions of each vessel. Bilateral testing is considered an integral part of a complete examination. Limited examinations for reoccurring indications may be performed as noted. The reflux portion of the exam is performed with the patient in reverse Trendelenburg.  +---------+---------------+---------+-----------+-------------+--------------+ RIGHT    CompressibilityPhasicitySpontaneityProperties   Thrombus Aging +---------+---------------+---------+-----------+-------------+--------------+ CFV      Full           Yes      Yes        rouleaux flow               +---------+---------------+---------+-----------+-------------+--------------+ SFJ      Full  rouleaux flow               +---------+---------------+---------+-----------+-------------+--------------+ FV Prox  Full           Yes      Yes                                    +---------+---------------+---------+-----------+-------------+--------------+ FV Mid   Full           Yes      Yes                                    +---------+---------------+---------+-----------+-------------+--------------+ FV DistalFull           Yes      Yes                                    +---------+---------------+---------+-----------+-------------+--------------+ PFV      Full                                                           +---------+---------------+---------+-----------+-------------+--------------+ POP       Full           Yes      Yes                                    +---------+---------------+---------+-----------+-------------+--------------+ PTV      Full                                                           +---------+---------------+---------+-----------+-------------+--------------+ PERO     Full                                                           +---------+---------------+---------+-----------+-------------+--------------+   +---------+---------------+---------+-----------+-------------+--------------+ LEFT     CompressibilityPhasicitySpontaneityProperties   Thrombus Aging +---------+---------------+---------+-----------+-------------+--------------+ CFV      Full           Yes      Yes        rouleaux flow               +---------+---------------+---------+-----------+-------------+--------------+ SFJ      Full                               rouleaux flow               +---------+---------------+---------+-----------+-------------+--------------+ FV Prox  Full           Yes      Yes                                    +---------+---------------+---------+-----------+-------------+--------------+  FV Mid   Full           Yes      Yes                                    +---------+---------------+---------+-----------+-------------+--------------+ FV DistalFull           Yes      Yes                                    +---------+---------------+---------+-----------+-------------+--------------+ PFV      Full                                                           +---------+---------------+---------+-----------+-------------+--------------+ POP      Full                                                           +---------+---------------+---------+-----------+-------------+--------------+ PTV      Full                                                            +---------+---------------+---------+-----------+-------------+--------------+ PERO     None           No       No                      Acute          +---------+---------------+---------+-----------+-------------+--------------+ Soleal   None           No       No                      Acute          +---------+---------------+---------+-----------+-------------+--------------+     Summary: BILATERAL: -No evidence of popliteal cyst, bilaterally. RIGHT: - There is no evidence of deep vein thrombosis in the lower extremity.  LEFT: - Findings consistent with acute deep vein thrombosis involving the left peroneal veins. Findings consistent with acute intramuscular thrombosis involving the left soleal veins.  *See table(s) above for measurements and observations. Electronically signed by Norman Serve on 08/28/2024 at 6:50:55 PM.    Final    CT Angio Chest Pulmonary Embolism (PE) W or WO Contrast Addendum Date: 08/28/2024 ADDENDUM REPORT: 08/28/2024 15:42 ADDENDUM: Critical Value/emergent results were called by telephone at the time of interpretation on 08/28/2024 at 3:40 pm to provider Bari Boards, who verbally acknowledged these results. Electronically Signed   By: Leita Birmingham M.D.   On: 08/28/2024 15:42   Result Date: 08/28/2024 CLINICAL DATA:  Pulmonary embolism suspected, high probability. New hypoxic requirement with tachycardia. Possible DVT. EXAM: CT ANGIOGRAPHY CHEST WITH CONTRAST TECHNIQUE: Multidetector CT imaging of the chest was performed using the standard protocol during bolus administration of  intravenous contrast. Multiplanar CT image reconstructions and MIPs were obtained to evaluate the vascular anatomy. RADIATION DOSE REDUCTION: This exam was performed according to the departmental dose-optimization program which includes automated exposure control, adjustment of the mA and/or kV according to patient size and/or use of iterative reconstruction technique. CONTRAST:  75mL OMNIPAQUE   IOHEXOL  350 MG/ML SOLN COMPARISON:  None Available. FINDINGS: Cardiovascular: The heart is enlarged and there is a trace pericardial effusion. Scattered coronary artery calcifications are present. There is atherosclerotic calcification of the aorta with aneurysmal dilatation of the ascending aorta measuring 4.8 cm. The pulmonary trunk is distended suggesting underlying pulmonary artery hypertension. Small segmental and subsegmental pulmonary emboli are noted in the lower lobes bilaterally. Examination is limited due to mixing artifact and respiratory motion. Mediastinum/Nodes: No mediastinal, hilar, or axillary lymphadenopathy is seen. The thyroid gland, trachea, and esophagus are within normal limits. Lungs/Pleura: Centrilobular emphysematous changes are present in the lungs. A strandy airspace disease is noted in the infrahilar right middle lobe. Atelectasis is present bilaterally. No effusion or pneumothorax is seen. Upper Abdomen: No acute abnormality. Musculoskeletal: Degenerative changes are noted in the thoracic spine. No acute osseous abnormality is seen. Review of the MIP images confirms the above findings. IMPRESSION: 1. Small segmental and subsegmental pulmonary emboli bilaterally. 2. Strandy airspace disease in the infrahilar region of the right middle lobe, possible atelectasis or infiltrate. 3. Cardiomegaly with coronary artery calcifications. 4. 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 Ascending thoracic aortic aneurysm. Recommend semi-annual imaging followup by CTA or MRA and referral to cardiothoracic surgery if not already obtained. This recommendation follows 2010 ACCF/AHA/AATS/ACR/ASA/SCA/SCAI/SIR/STS/SVM Guidelines for the Diagnosis and Management of Patients With Thoracic Aortic Disease. Circulation. 2010; 121: Z733-z630. Aortic aneurysm NOS (ICD10-I71.9) Electronically Signed: By: Leita Birmingham M.D. On: 08/28/2024 15:20   CT ABDOMEN PELVIS W  CONTRAST Result Date: 08/28/2024 CLINICAL DATA:  Abdominal pain. EXAM: CT ABDOMEN AND PELVIS WITH CONTRAST TECHNIQUE: Multidetector CT imaging of the abdomen and pelvis was performed using the standard protocol following bolus administration of intravenous contrast. RADIATION DOSE REDUCTION: This exam was performed according to the departmental dose-optimization program which includes automated exposure control, adjustment of the mA and/or kV according to patient size and/or use of iterative reconstruction technique. CONTRAST:  75mL OMNIPAQUE  IOHEXOL  350 MG/ML SOLN COMPARISON:  CT abdomen pelvis dated 07/23/2024. FINDINGS: Evaluation of this exam is limited due to respiratory motion. Lower chest: No acute abnormality. No intra-abdominal free air or free fluid. Hepatobiliary: Fatty liver. No biliary dilatation. The gallbladder is unremarkable Pancreas: Unremarkable. No pancreatic ductal dilatation or surrounding inflammatory changes. Spleen: Normal in size without focal abnormality. Adrenals/Urinary Tract: The adrenal glands are unremarkable. Small right renal cyst. There is no hydronephrosis on either side. There is symmetric enhancement and excretion of contrast by both kidneys. The visualized ureters and urinary bladder appear unremarkable. Stomach/Bowel: There is moderate distal colonic diverticulosis. There is no bowel obstruction or active inflammation. The appendix is normal. Vascular/Lymphatic: Moderate aortoiliac atherosclerotic disease. The IVC is unremarkable. Faint hypodense focus in the right, femoral vein at the junction of the deep femoral and femoral veins (92/3) suspicious for DVT. Further evaluation with duplex ultrasound recommended. Slight decreased contrast density in the left deep femoral vein (91/3) may be artifactual or represent DVT. No portal venous gas. There is no adenopathy. Reproductive: Multiple calcified uterine fibroids. No suspicious adnexal masses. Other: Small fat containing right  inguinal hernia. Musculoskeletal: Osteopenia with degenerative changes. Grade 1 L4-L5 anterolisthesis. No acute  osseous pathology. IMPRESSION: 1. No acute intra-abdominal or pelvic pathology. 2. Colonic diverticulosis. No bowel obstruction. Normal appendix. 3. Fatty liver. 4. Findings concerning for right common femoral vein and possibly left deep femoral vein DVT. Further evaluation with duplex ultrasound recommended. 5.  Aortic Atherosclerosis (ICD10-I70.0). These results will be called to the ordering clinician or representative by the Radiologist Assistant, and communication documented in the PACS or [employer]. Electronically Signed   By: Vanetta Chou M.D.   On: 08/28/2024 10:12   DG Chest Port 1 View Result Date: 08/27/2024 EXAM: 1 VIEW(S) XRAY OF THE CHEST 08/27/2024 04:58:00 PM COMPARISON: 07/12/2024 CLINICAL HISTORY: Fever. FINDINGS: LUNGS AND PLEURA: Patchy right lower lobe airspace opacity. No pleural effusion. No pneumothorax. HEART AND MEDIASTINUM: Cardiomegaly. Atherosclerotic calcifications. BONES AND SOFT TISSUES: No acute osseous abnormality. IMPRESSION: 1. Patchy right lower lobe airspace opacity, suspicious for pneumonia. Electronically signed by: Oneil Devonshire MD 08/27/2024 05:41 PM EST RP Workstation: [id number]   CT KNEE LEFT WO CONTRAST Result Date: 08/26/2024 EXAM: CT LEFT KNEE WITHOUT IV CONTRAST 08/26/2024 12:11:08 PM TECHNIQUE: Axial images were acquired through the left knee without IV contrast. Reformatted images were reviewed. Automated exposure control, iterative reconstruction, and/or weight based adjustment of the mA/kV was utilized to reduce the radiation dose to as low as reasonably achievable. COMPARISON: Radiographs 03/02/2024. CLINICAL HISTORY: Left knee pain, acute on chronic, effusion on exam, tenderness to palpation. Knee replacement, soft-tissue abnormality suspected. FINDINGS: BONES: Total knee prosthesis in place without definite abnormality. Periprosthetic  lucency along the metal/methacrylate or methacrylate-bone interfaces. Resurfaced patellar component noted. No periprosthetic fracture or acute bony findings identified. JOINTS: No dislocation. Moderate knee effusion. Possible synovitis with mildly thickened appearance along the synovium. SOFT TISSUES: Atheromatous vascular calcifications. Mild diffuse regional muscular atrophy. No significant abnormal subcutaneous fluid collection or appreciable abscess. IMPRESSION: 1. Moderate knee effusion with possible synovitis. 2. No significant abnormal subcutaneous fluid collection or abscess. 3. Total knee arthroplasty, including a resurfaced patellar component, without periprosthetic lucency, periprosthetic fracture, or acute osseous abnormality. 4. Atheromatous vascular calcifications. 5. Mild diffuse regional muscular atrophy. Electronically signed by: Ryan Salvage MD 08/26/2024 12:40 PM EST RP Workstation: [id number]   DG ELBOW COMPLETE RIGHT (3+VIEW) Result Date: 08/25/2024 CLINICAL DATA:  Right elbow pain. EXAM: RIGHT ELBOW - COMPLETE 3+ VIEW COMPARISON:  None Available. FINDINGS: There is no evidence of fracture, dislocation, or joint effusion. Degenerative changes are seen along the coronoid process. Mild soft tissue swelling is seen along the right olecranon process. IMPRESSION: Mild degenerative changes and mild posterior soft tissue swelling without an acute osseous abnormality. Electronically Signed   By: Suzen Dials M.D.   On: 08/25/2024 12:32   DG Foot Complete Right Result Date: 08/06/2024 EXAM: 3 OR MORE VIEW(S) XRAY OF THE RIGHT FOOT 08/06/2024 04:14:00 PM COMPARISON: None available. CLINICAL HISTORY: Hallux valgus (acquired), right foot. FINDINGS: BONES AND JOINTS: Hallux valgus (acquired), right foot. Degenerative changes in the first metatarsophalangeal joint. Degenerative changes in the intertarsal joints. No acute fracture. No dislocation. No focal bone erosion or bone destruction. SOFT  TISSUES: Mild overlying soft tissue swelling. No radiopaque soft tissue foreign bodies or soft tissue gas. IMPRESSION: 1. Degenerative changes in the first metatarsophalangeal and intertarsal joints. 2. No acute fracture or dislocation. 3. Mild overlying soft tissue swelling without radiopaque soft tissue foreign body or soft tissue gas. Electronically signed by: Elsie Gravely MD 08/06/2024 08:19 PM EST RP Workstation: [id number]   CT KNEE RIGHT WO CONTRAST Result Date: 08/05/2024 CLINICAL DATA:  Knee pain.  Fall. EXAM: CT OF THE RIGHT KNEE WITHOUT CONTRAST TECHNIQUE: Multidetector CT imaging of the right knee was performed according to the standard protocol. Multiplanar CT image reconstructions were also generated. RADIATION DOSE REDUCTION: This exam was performed according to the departmental dose-optimization program which includes automated exposure control, adjustment of the mA and/or kV according to patient size and/or use of iterative reconstruction technique. COMPARISON:  Right knee radiographs dated 08/04/2024. FINDINGS: Bones/Joint/Cartilage No acute fracture or dislocation. Redemonstrated severe tricompartmental osteoarthritis of the knee most pronounced in the medial femorotibial compartment with severe joint space narrowing resulting in near bone-on-bone contact, subchondral sclerosis, subchondral cystic changes, and marginal osteophytosis. Bulky fragmented superior patellar pole osteophytes are again noted. Similar mild lateral subluxation of the tibia relative to the femur. Large knee joint effusion. Ligaments Ligaments are suboptimally evaluated by CT. Muscles and Tendons No appreciable acute muscular abnormality. The patellar tendon is intact. Attenuated appearance of the distal quadriceps tendon. Partial-thickness tear can not be excluded. Soft tissue Subcutaneous edema along the anterior and anterolateral knee. No loculated fluid collection. IMPRESSION: 1. No acute osseous abnormality. 2.  Severe tricompartmental osteoarthritis of the knee most pronounced in the medial femorotibial compartment resulting in near bone-on-bone contact. Similar mild lateral subluxation of the tibia relative to the femur. 3. Large knee joint effusion. 4. Attenuated appearance of the distal quadriceps tendon. Partial-thickness tear can not be excluded. 5. Subcutaneous edema along the anterior and anterolateral knee. Electronically Signed   By: Harrietta Sherry M.D.   On: 08/05/2024 12:42   DG Knee Complete 4 Views Right Result Date: 08/04/2024 EXAM: 4 OR MORE VIEW(S) XRAY OF THE RIGHT KNEE 08/04/2024 11:32:00 AM COMPARISON: Right knee series dated 05/05/2023. CLINICAL HISTORY: Fall, pain. FINDINGS: BONES AND JOINTS: No acute fracture. There is severe tricompartmental osteoarthritis of the right knee with mild lateral subluxation of the tibial plateau relative to the femoral condyles. There is moderate joint space narrowing, particularly involving the medial tibial femoral compartment. There is sclerosis of the articular surfaces. There is patellofemoral osteophytosis. There is a mild suprapatellar bursal effusion. SOFT TISSUES: There are vascular calcifications. IMPRESSION: 1. 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 2. Mild suprapatellar bursal effusion. Electronically signed by: Evalene Coho MD 08/04/2024 12:31 PM EST RP Workstation: [id number]   DG HIP UNILAT W OR W/O PELVIS 2-3 VIEWS RIGHT Result Date: 08/04/2024 EXAM: 2 or 3 VIEW(S) XRAY OF THE RIGHT HIP 08/04/2024 11:32:00 AM COMPARISON: Bilateral hip series dated 02/29/2004. CLINICAL HISTORY: fall, pain FINDINGS: BONES AND JOINTS: There is a new, ovoid sclerotic density present superior laterally  within the right femoral neck measuring approximately 15 x 11 mm. No acute fracture. No malalignment. There are mild degenerative changes within the sacroiliac joints bilaterally. SOFT TISSUES: There are numerous phleboliths within the pelvis. LUMBAR SPINE: There are mild degenerative changes within the lower lumbar spine. IMPRESSION: 1. New ovoid sclerotic density in the superolateral right femoral neck measuring approximately 15 x 11 mm; indeterminate (bone island favored), consider follow-up imaging to document stability. 2. No acute fracture or dislocation. 3. Mild degenerative changes of the sacroiliac joints bilaterally and lower lumbar spine. Electronically signed by: Evalene Coho MD 08/04/2024 12:30 PM EST RP Workstation: [id number]   I personally spent a total of 51 minutes in the care of the patient today including preparing to see  the patient, getting/reviewing separately obtained history, performing a medically appropriate exam/evaluation, counseling and educating, placing orders, referring and communicating with other health care professionals, documenting clinical information in the EHR, independently interpreting results, communicating results, and coordinating care.    Electronically signed by:   Annalee Orem, MD Infectious Disease Physician [facility] Pager: [phone]  "

## 2024-08-31 ENCOUNTER — Encounter (HOSPITAL_COMMUNITY): Payer: Self-pay | Admitting: Anesthesiology

## 2024-08-31 ENCOUNTER — Inpatient Hospital Stay (HOSPITAL_COMMUNITY): Payer: Self-pay | Admitting: Anesthesiology

## 2024-08-31 ENCOUNTER — Encounter (HOSPITAL_COMMUNITY)
Admission: EM | Disposition: A | Discharge status: Skilled Nursing Facility | Payer: Self-pay | Source: Home / Self Care | Attending: Family Medicine

## 2024-08-31 ENCOUNTER — Encounter (HOSPITAL_COMMUNITY): Payer: Self-pay | Admitting: Family Medicine

## 2024-08-31 DIAGNOSIS — N1832 Chronic kidney disease, stage 3b: Secondary | ICD-10-CM

## 2024-08-31 DIAGNOSIS — I129 Hypertensive chronic kidney disease with stage 1 through stage 4 chronic kidney disease, or unspecified chronic kidney disease: Secondary | ICD-10-CM | POA: Diagnosis not present

## 2024-08-31 DIAGNOSIS — T8454XA Infection and inflammatory reaction due to internal left knee prosthesis, initial encounter: Secondary | ICD-10-CM

## 2024-08-31 DIAGNOSIS — E1122 Type 2 diabetes mellitus with diabetic chronic kidney disease: Secondary | ICD-10-CM | POA: Diagnosis not present

## 2024-08-31 DIAGNOSIS — M009 Pyogenic arthritis, unspecified: Secondary | ICD-10-CM | POA: Diagnosis not present

## 2024-08-31 LAB — BODY FLUID CULTURE W GRAM STAIN

## 2024-08-31 LAB — CK: Total CK: 33 U/L — ABNORMAL LOW (ref 38–234)

## 2024-08-31 LAB — HEPATITIS PANEL, ACUTE
HCV Ab: NONREACTIVE
Hep A IgM: NONREACTIVE
Hep B C IgM: NONREACTIVE
Hepatitis B Surface Ag: NONREACTIVE

## 2024-08-31 MED ORDER — PROPOFOL 10 MG/ML IV BOLUS
INTRAVENOUS | Status: DC | PRN
  Administered 2024-08-31: 150 mg via INTRAVENOUS

## 2024-08-31 MED ORDER — SODIUM CHLORIDE 0.9 % IR SOLN
Status: DC | PRN
  Administered 2024-08-31 (×2): 3000 mL
  Administered 2024-08-31: 3000 mL via INTRAVESICAL

## 2024-08-31 MED ORDER — FENTANYL CITRATE (PF) 250 MCG/5ML IJ SOLN
INTRAMUSCULAR | Status: AC
  Filled 2024-08-31: qty 5

## 2024-08-31 MED ORDER — PROPOFOL 10 MG/ML IV BOLUS
INTRAVENOUS | Status: AC
  Filled 2024-08-31: qty 20

## 2024-08-31 MED ORDER — LIDOCAINE 2% (20 MG/ML) 5 ML SYRINGE
INTRAMUSCULAR | Status: DC | PRN
  Administered 2024-08-31: 100 mg via INTRAVENOUS

## 2024-08-31 MED ORDER — OXYCODONE HCL 5 MG/5ML PO SOLN: 5.0000 mg | Freq: Once | ORAL | Status: DC | PRN

## 2024-08-31 MED ORDER — DEXAMETHASONE SOD PHOSPHATE PF 10 MG/ML IJ SOLN
INTRAMUSCULAR | Status: DC | PRN
  Administered 2024-08-31: 10 mg via INTRAVENOUS

## 2024-08-31 MED ORDER — FENTANYL CITRATE (PF) 100 MCG/2ML IJ SOLN
INTRAMUSCULAR | Status: AC
  Filled 2024-08-31: qty 2

## 2024-08-31 MED ORDER — OXYCODONE HCL 5 MG PO TABS: 5.0000 mg | ORAL_TABLET | Freq: Once | ORAL | Status: DC | PRN

## 2024-08-31 MED ORDER — ONDANSETRON HCL 4 MG/2ML IJ SOLN: 4.0000 mg | Freq: Once | INTRAMUSCULAR | Status: DC | PRN

## 2024-08-31 MED ORDER — CHLORHEXIDINE GLUCONATE 0.12 % MT SOLN
15.0000 mL | Freq: Once | OROMUCOSAL | Status: AC
  Administered 2024-08-31: 15 mL via OROMUCOSAL

## 2024-08-31 MED ORDER — BUPIVACAINE HCL (PF) 0.25 % IJ SOLN
INTRAMUSCULAR | Status: AC
  Filled 2024-08-31: qty 30

## 2024-08-31 MED ORDER — PHENYLEPHRINE 80 MCG/ML (10ML) SYRINGE FOR IV PUSH (FOR BLOOD PRESSURE SUPPORT)
PREFILLED_SYRINGE | INTRAVENOUS | Status: DC | PRN
  Administered 2024-08-31: 40 ug via INTRAVENOUS

## 2024-08-31 MED ORDER — CEFAZOLIN SODIUM-DEXTROSE 2-4 GM/100ML-% IV SOLN: 2.0000 g | INTRAVENOUS | Status: DC

## 2024-08-31 MED ORDER — FENTANYL CITRATE (PF) 100 MCG/2ML IJ SOLN
25.0000 ug | INTRAMUSCULAR | Status: DC | PRN
  Administered 2024-08-31 (×2): 25 ug via INTRAVENOUS

## 2024-08-31 MED ORDER — ORAL CARE MOUTH RINSE: 15.0000 mL | Freq: Once | OROMUCOSAL | Status: AC

## 2024-08-31 MED ORDER — SODIUM CHLORIDE 0.9 % IV SOLN: INTRAVENOUS | Status: DC

## 2024-08-31 MED ORDER — FENTANYL CITRATE (PF) 250 MCG/5ML IJ SOLN
INTRAMUSCULAR | Status: DC | PRN
  Administered 2024-08-31 (×3): 25 ug via INTRAVENOUS
  Administered 2024-08-31: 50 ug via INTRAVENOUS
  Administered 2024-08-31: 25 ug via INTRAVENOUS

## 2024-08-31 MED ORDER — ONDANSETRON HCL 4 MG/2ML IJ SOLN
INTRAMUSCULAR | Status: DC | PRN
  Administered 2024-08-31: 4 mg via INTRAVENOUS

## 2024-08-31 MED ORDER — LACTATED RINGERS IV SOLN: INTRAVENOUS | Status: DC

## 2024-08-31 MED ORDER — VANCOMYCIN HCL 1500 MG/300ML IV SOLN: 1500.0000 mg | INTRAVENOUS | Status: DC

## 2024-08-31 MED ORDER — MEPERIDINE HCL 25 MG/ML IJ SOLN: 6.2500 mg | INTRAMUSCULAR | Status: DC | PRN

## 2024-08-31 MED ORDER — BUPIVACAINE HCL (PF) 0.25 % IJ SOLN
INTRAMUSCULAR | Status: DC | PRN
  Administered 2024-08-31: 20 mL

## 2024-08-31 NOTE — Plan of Care (Signed)
 Left knee removed infection and placed a drain today knee washout

## 2024-08-31 NOTE — H&P (Signed)

## 2024-08-31 NOTE — Anesthesia Procedure Notes (Signed)
 Procedure Name: LMA Insertion Date/Time: 08/31/2024 1:28 PM  Performed by: Claudene Florina Boga, CRNAPre-anesthesia Checklist: Patient identified, Emergency Drugs available, Suction available and Patient being monitored Patient Re-evaluated:Patient Re-evaluated prior to induction Oxygen Delivery Method: Circle System Utilized Preoxygenation: Pre-oxygenation with 100% oxygen Induction Type: IV induction Ventilation: Mask ventilation without difficulty LMA: LMA with gastric port inserted LMA Size: 4.0 Number of attempts: 1 Placement Confirmation: positive ETCO2 Tube secured with: Tape Dental Injury: Teeth and Oropharynx as per pre-operative assessment

## 2024-08-31 NOTE — Op Note (Signed)
" ° °  Date of Surgery: 08/31/2024  INDICATIONS: Alison Whitehead is a 74 y.o.-year-old female with a left prosthetic knee septic arthritis.  The daughter, POA, did consent to the procedure after discussion of the risks and benefits.  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  PROCEDURE:  Arthroscopic left knee major synovectomy  Arthroscopic left knee irrigation and debridement  Debridement type: Excisional Debridement  Side: left  Body Location: left knee   Tools used for debridement: arthroscopic shaver  Irrigation volume: 12 L     Irrigation fluid type: Normal Saline  SURGEON: N. Ozell Cummins, M.D.  ASSIST: Ronal Jacobsen [place], [place]; necessary for the timely completion of procedure and due to complexity of procedure.  ANESTHESIA:  general  IV FLUIDS AND URINE: See anesthesia.  ESTIMATED BLOOD LOSS: minimal mL.  IMPLANTS: None  DRAINS: Medium HVAC  COMPLICATIONS: see description of procedure.  DESCRIPTION OF PROCEDURE: The patient was brought to the operating room.  The patient had been signed prior to the procedure and this was documented. The patient had the anesthesia placed by the anesthesiologist.  A time-out was performed to confirm that this was the correct patient, site, side and location. The patient did receive antibiotics prior to the incision and was re-dosed during the procedure as needed at indicated intervals.  A tourniquet was placed.  The patient had the operative extremity prepped and draped in the standard surgical fashion.    Anterolateral knee arthroscopy portal was created.  There was return of murky fluid.  A superolateral portal was also created.  Diagnostic arthroscopy ensued.  There was severe synovitis in all 3 compartments.  Murky fluid was expressed from the knee.  A total knee arthroplasty was in place without any obvious problems.  Major synovectomy was then performed with a motorized  shaver in all 3 compartments.  Infected tissue was also debrided away.  After thorough debridement 12 L of normal saline was irrigated through the knee.  A medium Hemovac was then placed in the joint using arthroscopic visualization through the superolateral portal and was sutured to the skin with a 3-0 nylon.  The rest of the incisions were closed with 3-0 nylon.  Local anesthetic was placed in the portal sites and the joint.  Sterile dressings were applied.  Patient tolerated procedure well had no immediate complications.  [name] was necessary for opening, closing, retracting, limb positioning and overall facilitation and timely completion of the procedure.  POSTOPERATIVE PLAN: Patient will be weight-bear as tolerated to the left lower extremity.  She may begin physical therapy tomorrow.  Will leave the Hemovac drain in for a couple days.  Long-term plan is for implant retention.  She is not a candidate for revision surgery.  GEANNIE Ozell Cummins, MD 2:22 PM     "

## 2024-08-31 NOTE — Transfer of Care (Signed)
 Immediate Anesthesia Transfer of Care Note  Patient: Alison Whitehead  Procedure(s) Performed: ARTHROSCOPY, KNEE WITH IRRIGATION AND DEBRIDEMENT (Left: Knee)  Patient Location: PACU  Anesthesia Type:General  Level of Consciousness: awake, alert , and oriented  Airway & Oxygen Therapy: Patient Spontanous Breathing and Patient connected to nasal cannula oxygen  Post-op Assessment: Report given to RN and Post -op Vital signs reviewed and stable  Post vital signs: Reviewed and stable  Last Vitals:  Vitals Value Taken Time  BP 132/92 08/31/24 14:34  Temp    Pulse 96 08/31/24 14:36  Resp 17 08/31/24 14:36  SpO2 94 % 08/31/24 14:36  Vitals shown include unfiled device data.  Last Pain:  Vitals:   08/31/24 1216  TempSrc:   PainSc: 7          Complications: No notable events documented.

## 2024-08-31 NOTE — Anesthesia Postprocedure Evaluation (Signed)
"   Anesthesia Post Note  Patient: Alison Whitehead  Procedure(s) Performed: ARTHROSCOPY, KNEE WITH IRRIGATION AND DEBRIDEMENT (Left: Knee)     Patient location during evaluation: PACU Anesthesia Type: General Level of consciousness: awake and alert Pain management: pain level controlled Vital Signs Assessment: post-procedure vital signs reviewed and stable Respiratory status: spontaneous breathing, nonlabored ventilation, respiratory function stable and patient connected to nasal cannula oxygen Cardiovascular status: blood pressure returned to baseline and stable Postop Assessment: no apparent nausea or vomiting Anesthetic complications: no   No notable events documented.  Last Vitals:  Vitals:   08/31/24 1216 08/31/24 1434  BP: (!) 133/96 (!) 132/92  Pulse: (!) 101 96  Resp: 19 (!) 25  Temp:  36.5 C  SpO2: 95% 93%    Last Pain:  Vitals:   08/31/24 1216  TempSrc:   PainSc: 7                  [name]      "

## 2024-08-31 NOTE — Anesthesia Preprocedure Evaluation (Addendum)
 "                                  Anesthesia Evaluation  Patient identified by MRN, date of birth, ID band Patient awake    Reviewed: Allergy & Precautions, NPO status , Patient's Chart, lab work & pertinent test results  Airway Mallampati: II  TM Distance: >3 FB Neck ROM: Full    Dental  (+) Edentulous Upper, Edentulous Lower   Pulmonary former smoker   Pulmonary exam normal        Cardiovascular hypertension, Pt. on medications and Pt. on home beta blockers  Rhythm:Regular Rate:Normal  ECHO 24'   1. Left ventricular ejection fraction, by estimation, is 60 to 65%. The  left ventricle has normal function. The left ventricle has no regional  wall motion abnormalities. Left ventricular diastolic parameters are  consistent with Grade I diastolic  dysfunction (impaired relaxation).   2. Right ventricular systolic function is normal. The right ventricular  size is normal. There is severely elevated pulmonary artery systolic  pressure. The estimated right ventricular systolic pressure is 61.5 mmHg.   3. The mitral valve is normal in structure. No evidence of mitral valve  regurgitation.   4. Tricuspid valve regurgitation is moderate.   5. The aortic valve is tricuspid. There is mild calcification of the  aortic valve. Aortic valve regurgitation is not visualized. Aortic valve  sclerosis/calcification is present, without any evidence of aortic  stenosis.   6. The inferior vena cava is dilated in size with <50% respiratory  variability, suggesting right atrial pressure of 15 mmHg.     Neuro/Psych  Headaches CVA, No Residual Symptoms  negative psych ROS   GI/Hepatic negative GI ROS, Neg liver ROS,,,  Endo/Other  diabetes, Type 2, Oral Hypoglycemic Agents    Renal/GU CRFRenal disease  negative genitourinary   Musculoskeletal  (+) Arthritis , Osteoarthritis,    Abdominal Normal abdominal exam  (+)   Peds  Hematology negative hematology ROS (+)    Anesthesia Other Findings   Reproductive/Obstetrics                              Anesthesia Physical Anesthesia Plan  ASA: 4 and emergent  Anesthesia Plan: General   Post-op Pain Management: Minimal or no pain anticipated   Induction: Intravenous  PONV Risk Score and Plan: 3 and Ondansetron , Dexamethasone  and Treatment may vary due to age or medical condition  Airway Management Planned: LMA and Oral ETT  Additional Equipment: None  Intra-op Plan:   Post-operative Plan: Extubation in OR and Possible Post-op intubation/ventilation  Informed Consent: I have reviewed the patients History and Physical, chart, labs and discussed the procedure including the risks, benefits and alternatives for the proposed anesthesia with the patient or authorized representative who has indicated his/her understanding and acceptance.    Discussed DNR with power of attorney.   Dental advisory given  Plan Discussed with: CRNA and Anesthesiologist  Anesthesia Plan Comments: (Discussed with patients daughter OK for cardiac meds OK for airway management NO to chest compressions  Hospital Course: Alison Whitehead  is a 74 y.o Female with extensive history of CKD IIIb, HTN, HLD, anemia of chronic disease, prediabetic, chronic pancreatic insufficiency... Multiple admission for worsening kidney function, failure to thrive, knee pain, confusion was discharged to SNF for rehab home now back again to ED due to progressive generalized  weakness, falls, poor p.o. intake.. Denies having any fever or chills nausea or vomiting, or dysuria.   Recent hospitalizations for similar complaint generalized weakness, spontaneous falls, poor p.o. intake, AKIs Recent hospitalization discharge 08/04/24 - 08/09/24 Previous to that 07/12/2024 -07/26/2024     DVT/PE:   - CTA 1/25 evening revealed small subsegmental PE's BL - already on full dose lovenox    MRSA bacteremia/sepsis based on SIRS  criteria   Fever:    Low back pain:   Failure to thrive, with progressive generalized weaknesses, with frequent falls - Frequent readmissions.     Hyperkalemia : -Now resolved. - Will trend.   Transaminitis: - Unclear cause, multifactorial possible etiologies. -Now found to have MRSA bacteremia plus bilateral small PEs. - Will continue to trend, fortunately LFTs downtrending.   AKI on CKD IIIb -AKI has resolved.  Baseline Cr 1.3-1.5  Cr 2.59 hold aspirin , furosemide , olmesartan  metformin - Avoid hypotension and nephrotoxic agents. Continues to improve. -Leading to metabolic acidosis. - Continue to trend creatinine in light of contrast dye she received for CT angiogram as well as being on vancomycin . -creat 2.00 --> vanco d/c'ed. Follow.   Chronic right knee osteoarthritis/left knee pain - During last hospitalization, status post arthrocentesis yielding 50 cc of fluid, with negative fluid analysis.  No signs of infection - Now complaining more of left knee pain - IR able to aspirate 40 cc of fluid from left knee effusion.  Cell counts consistent with infectious etiology ( wbc 27,300, 93% PMN's, no crystals) culture with rare Staphylococcus aureus. Creat 2 now--> vanc on hold, now d/c;'ed starting daptomycin  08/30/24 per ID. -septic arthritis__> Lknee--> for washout 08/31/24    Anemia of CKD Anemia of chronic disease, due to CKD Stable, obtaining iron studies   Hypertension Continue: Amlodipine , Coreg ,  Holding Lasix  and olmesartan .   Prediabetic Hemoglobin A1c only 5.8. Monitoring CBC closely   Class 2 obesity Last weight 107.4 kg - Resume Ozempic  at discharge.   Pancreatic insufficiency - Continue Creon    Ascending thoracic aortic aneurysm: - Found on CT angiogram yesterday.  Measuring 4.8 cm.  Recommend semiannual follow-up by CTA/MRA and outpatient referral to cardiothoracic surgery. -Please include this on discharge summary as well.   DVT prophylaxis:  SCDs  Start: 08/25/24 1431   Code Status:   Code Status: Limited: Do not attempt resuscitation (DNR) -DNR-LIMITED -Do Not Intubate/DNI  Level of care: Telemetry Status is: Inpatient     )        Anesthesia Quick Evaluation  "

## 2024-08-31 NOTE — Progress Notes (Signed)
 " PROGRESS NOTE    Alison Whitehead  FMW:[id number] DOB: May 30, 1951 DOA: 08/25/2024 PCP: [name], PA-C  Subjective: Patient seen resting in the hospital bed.  She wants water .  She complains of her left knee today.  Knee has a brace in place.  Left knee was very tender when I touched it.  Hospital Course: Alison Whitehead  is a 74 y.o Female with extensive history of CKD IIIb, HTN, HLD, anemia of chronic disease, prediabetic, chronic pancreatic insufficiency... Multiple admission for worsening kidney function, failure to thrive, knee pain, confusion was discharged to SNF for rehab home now back again to ED due to progressive generalized weakness, falls, poor p.o. intake.. Denies having any fever or chills nausea or vomiting, or dysuria.  Recent hospitalizations for similar complaint generalized weakness, spontaneous falls, poor p.o. intake, AKIs Recent hospitalization discharge 08/04/24 - 08/09/24 Previous to that 07/12/2024 -07/26/2024  ED Evaluation: Blood pressure 111/71, pulse (!) 103, temperature 99 F (37.2 C), temperature source Oral, resp. rate (!) 21, SpO2 97%. LABs: WBC 11.7, hemoglobin 8.2, potassium 5.7, CO2 18, BUN 63, creatinine 2.59,  Requested for patient to admitted for failure to thrive, progressive generalized weakness, acute on chronic kidney disease, hyperkalemia.       Assessment and Plan:  DVT/PE:   - CTA 1/25 evening revealed small subsegmental PE's BL - already on full dose lovenox . Will tx to eliquis prior to DC.  - pain prior to admission in Left knee makes it concerning she's had this DVT for a while.  Has been on PPX heparin  throughout hospital this admit   MRSA bacteremia/sepsis based on SIRS criteria - ordered after she spike a fever 1/23 - 2/3 bottles now reportedly growing MRSA - starting vanco, ID autoconsulted - appreciate ID input  - TTE pending May need TEE based on results.   Fever:  - new 1/23 evening - reason for  initial blood cultures.   - Previously  on cefepime  for concern for HAP -- await CTA and stop cefepime  if no evidence of PNA.  ID stopped cefepime , some strandy airspace disease of R middle lobe.   - Low threshold to restart more broad based antibiotics if she decompensates or white count continues to rise after stopping her cefepime  1/25   Low back pain: - Subjectively, her main issue.  Describes exquisite back pain with spasms. - Was being controlled on Dilaudid , switched to morphine  but this did not control pain and she is being switched back to Dilaudid . - Also on baclofen . - MRI back obtained and no evidence of seeding to paraspinal muscles nor vertebral spaces/discitis   Failure to thrive, with progressive generalized weaknesses, with frequent falls - Frequent readmissions.   - Consulted PT OT--> recommending SNF. - Recent admit and discharge to SNF - Patient likely need longer SNF stay for rehab - Fall precautions - Appreciate palliative input--> unable to reach family.   Hyperkalemia : -Now resolved. - Will trend.   Transaminitis: - Unclear cause, multifactorial possible etiologies. -Now found to have MRSA bacteremia plus bilateral small PEs. - Will continue to trend, fortunately LFTs downtrending.   AKI on CKD IIIb -AKI has resolved.  Baseline Cr 1.3-1.5  Cr 2.59 hold aspirin , furosemide , olmesartan  metformin - Avoid hypotension and nephrotoxic agents. Continues to improve. -Leading to metabolic acidosis. - Continue to trend creatinine in light of contrast dye she received for CT angiogram as well as being on vancomycin . -creat 2.00 --> vanco d/c'ed. Follow.   Chronic right knee osteoarthritis/left knee  pain - During last hospitalization, status post arthrocentesis yielding 50 cc of fluid, with negative fluid analysis.  No signs of infection - Now complaining more of left knee pain - IR able to aspirate 40 cc of fluid from left knee effusion.  Cell counts consistent  with infectious etiology ( wbc 27,300, 93% PMN's, no crystals) culture with rare Staphylococcus aureus. Creat 2 now--> vanc on hold, now d/c;'ed starting daptomycin  08/30/24 per ID. -septic arthritis__> Lknee--> for washout 08/31/24    Anemia of CKD Anemia of chronic disease, due to CKD Stable, obtaining iron studies   Hypertension Continue: Amlodipine , Coreg ,  Holding Lasix  and olmesartan .   Prediabetic Hemoglobin A1c only 5.8. Monitoring CBC closely   Class 2 obesity Last weight 107.4 kg - Resume Ozempic  at discharge.   Pancreatic insufficiency - Continue Creon    Ascending thoracic aortic aneurysm: - Found on CT angiogram yesterday.  Measuring 4.8 cm.  Recommend semiannual follow-up by CTA/MRA and outpatient referral to cardiothoracic surgery. -Please include this on discharge summary as well.   DVT prophylaxis:  SCDs Start: 08/25/24 1431   Code Status:   Code Status: Limited: Do not attempt resuscitation (DNR) -DNR-LIMITED -Do Not Intubate/DNI  Level of care: Telemetry Status is: Inpatient      DVT prophylaxis: Sequential compression device to OR Start: 08/31/24 0816 SCDs Start: 08/25/24 1431  SCDs   Code Status: Limited: Do not attempt resuscitation (DNR) -DNR-LIMITED -Do Not Intubate/DNI  Family Communication: No one at bedside Disposition Plan: Unclear, may need prolonged course of IV antibiotics Reason for continuing need for hospitalization: MRSA bacteremia under investigation  Objective: Vitals:   08/30/24 1524 08/30/24 1940 08/31/24 0259 08/31/24 0754  BP: 114/88 (!) 137/103 114/83 124/82  Pulse: (!) 105 (!) 105 (!) 104 (!) 102  Resp: 20 15 18 20   Temp: (!) 100.4 F (38 C) 98.5 F (36.9 C) 97.8 F (36.6 C) 97.7 F (36.5 C)  TempSrc: Oral Oral Oral Oral  SpO2: 97% 95% 95% 95%  Weight:        Intake/Output Summary (Last 24 hours) at 08/31/2024 1029 Last data filed at 08/31/2024 0518 Gross per 24 hour  Intake 200 ml  Output 350 ml  Net -150 ml    Filed Weights   08/27/24 0500 08/28/24 0500  Weight: 100.7 kg 102.6 kg    Examination: Patient seen in conjunction with the echo tech (echo ongoing). Normal work of breathing Follows simple commands moves all extremities Abdomen is soft Extremities are warm and well-perfused no obvious edema Left knee very tender to palpation today.    Data Reviewed: I have personally reviewed following labs and imaging studies  CBC: Recent Labs  Lab 08/26/24 0442 08/27/24 1153 08/28/24 0439 08/29/24 0348 08/30/24 0613  WBC 10.8* 10.1 10.3 10.7* 8.1  HGB 8.0* 8.1* 8.1* 8.3* 8.4*  HCT 26.6* 26.6* 26.2* 27.5* 28.3*  MCV 85.3 83.9 82.4 82.8 84.0  PLT 512* 475* 480* 442* 388   Basic Metabolic Panel: Recent Labs  Lab 08/25/24 1757 08/26/24 0442 08/27/24 1153 08/28/24 0439 08/29/24 0348 08/30/24 0613  NA  --  138 141 140 141 144  K  --  5.4* 4.4 4.3 4.3 4.7  CL  --  107 109 108 109 111  CO2  --  17* 19* 20* 18* 20*  GLUCOSE  --  87 145* 103* 102* 98  BUN  --  48* 33* 31* 28* 42*  CREATININE  --  1.62* 1.29* 1.20* 1.27* 2.00*  CALCIUM   --  9.1 9.1 9.2 9.3 9.3  MG 2.5*  --   --   --   --   --   PHOS 3.9  --   --   --   --   --    GFR: Estimated Creatinine Clearance: 31.3 mL/min (A) (by C-G formula based on SCr of 2 mg/dL (H)). Liver Function Tests: Recent Labs  Lab 08/25/24 1124 08/26/24 0442 08/28/24 0439 08/29/24 0348 08/30/24 0613  AST 16 19 82* 67* 70*  ALT 23 19 66* 72* 85*  ALKPHOS 114 113 175* 180* 183*  BILITOT 0.3 0.4 0.3 0.4 0.3  PROT 8.1 7.6 7.2 7.5 7.4  ALBUMIN  3.6 3.3* 2.9* 3.0* 2.9*   No results for input(s): LIPASE, AMYLASE in the last 168 hours. Recent Labs  Lab 08/28/24 1105  AMMONIA 16   Coagulation Profile: No results for input(s): INR, PROTIME in the last 168 hours. Cardiac Enzymes: Recent Labs  Lab 08/31/24 0622  CKTOTAL 33*   ProBNP, BNP (last 5 results) No results for input(s): PROBNP, BNP in the last 8760  hours. HbA1C: No results for input(s): HGBA1C in the last 72 hours. CBG: Recent Labs  Lab 08/25/24 1409 08/26/24 0910 08/28/24 0744 08/29/24 0624  GLUCAP 97 93 95 107*   Lipid Profile: No results for input(s): CHOL, HDL, LDLCALC, TRIG, CHOLHDL, LDLDIRECT in the last 72 hours. Thyroid Function Tests: No results for input(s): TSH, T4TOTAL, FREET4, T3FREE, THYROIDAB in the last 72 hours. Anemia Panel: No results for input(s): VITAMINB12, FOLATE, FERRITIN, TIBC, IRON, RETICCTPCT in the last 72 hours. Sepsis Labs: No results for input(s): PROCALCITON, LATICACIDVEN in the last 168 hours.  Recent Results (from the past 240 hours)  Urine Culture (for pregnant, neutropenic or urologic patients or patients with an indwelling urinary catheter)     Status: Abnormal   Collection Time: 08/27/24  7:26 PM   Specimen: Urine, Clean Catch  Result Value Ref Range Status   Specimen Description URINE, CLEAN CATCH  Final   Special Requests   Final    NONE Performed at [facility], [address] [place], [place] [address]    Culture (A)  Final    >=100,000 COLONIES/mL METHICILLIN RESISTANT STAPHYLOCOCCUS AUREUS   Report Status 08/29/2024 FINAL  Final   Organism ID, Bacteria METHICILLIN RESISTANT STAPHYLOCOCCUS AUREUS (A)  Final      Susceptibility   Methicillin resistant staphylococcus aureus - MIC*    CIPROFLOXACIN >=8 RESISTANT Resistant     GENTAMICIN <=0.5 SENSITIVE Sensitive     NITROFURANTOIN <=16 SENSITIVE Sensitive     OXACILLIN >=4 RESISTANT Resistant     TETRACYCLINE <=1 SENSITIVE Sensitive     VANCOMYCIN  1 SENSITIVE Sensitive     TRIMETH/SULFA <=10 SENSITIVE Sensitive     RIFAMPIN <=0.5 SENSITIVE Sensitive     Inducible Clindamycin POSITIVE Resistant     LINEZOLID 2 SENSITIVE Sensitive     * >=100,000 COLONIES/mL METHICILLIN RESISTANT STAPHYLOCOCCUS AUREUS  Culture, blood (Routine X 2) w Reflex to ID Panel     Status: Abnormal  (Preliminary result)   Collection Time: 08/27/24 10:02 PM   Specimen: BLOOD LEFT HAND  Result Value Ref Range Status   Specimen Description BLOOD LEFT HAND  Final   Special Requests   Final    BOTTLES DRAWN AEROBIC AND ANAEROBIC Blood Culture adequate volume   Culture  Setup Time   Final    GRAM POSITIVE COCCI IN BOTH AEROBIC AND ANAEROBIC BOTTLES CRITICAL RESULT CALLED TO, READ BACK BY  AND VERIFIED WITH: PHARMD T. DANG [id number] AT 1345, ADC    Culture (A)  Final    METHICILLIN RESISTANT STAPHYLOCOCCUS AUREUS Sent to [facility] for further susceptibility testing. Performed at [facility], [address] [place], [place] [address]    Report Status PENDING  Incomplete   Organism ID, Bacteria METHICILLIN RESISTANT STAPHYLOCOCCUS AUREUS  Final      Susceptibility   Methicillin resistant staphylococcus aureus - MIC*    CIPROFLOXACIN >=8 RESISTANT Resistant     ERYTHROMYCIN RESISTANT Resistant     GENTAMICIN <=0.5 SENSITIVE Sensitive     OXACILLIN >=4 RESISTANT Resistant     TETRACYCLINE <=1 SENSITIVE Sensitive     VANCOMYCIN  1 SENSITIVE Sensitive     TRIMETH/SULFA <=10 SENSITIVE Sensitive     CLINDAMYCIN RESISTANT Resistant     RIFAMPIN <=0.5 SENSITIVE Sensitive     Inducible Clindamycin POSITIVE Resistant     LINEZOLID 2 SENSITIVE Sensitive     * METHICILLIN RESISTANT STAPHYLOCOCCUS AUREUS  Blood Culture ID Panel (Reflexed)     Status: Abnormal   Collection Time: 08/27/24 10:02 PM  Result Value Ref Range Status   Enterococcus faecalis NOT DETECTED NOT DETECTED Final   Enterococcus Faecium NOT DETECTED NOT DETECTED Final   Listeria monocytogenes NOT DETECTED NOT DETECTED Final   Staphylococcus species DETECTED (A) NOT DETECTED Final    Comment: CRITICAL RESULT CALLED TO, READ BACK BY AND VERIFIED WITH: PHARMD T. DANG [id number] AT 1345, ADC    Staphylococcus aureus (BCID) DETECTED (A) NOT DETECTED Final    Comment: Methicillin (oxacillin)-resistant Staphylococcus aureus (MRSA).  MRSA is predictably resistant to beta-lactam antibiotics (except ceftaroline). Preferred therapy is vancomycin  unless clinically contraindicated. Patient requires contact precautions if  hospitalized. CRITICAL RESULT CALLED TO, READ BACK BY AND VERIFIED WITH: PHARMD T. DANG [id number] AT 1345, ADC    Staphylococcus epidermidis NOT DETECTED NOT DETECTED Final   Staphylococcus lugdunensis NOT DETECTED NOT DETECTED Final   Streptococcus species NOT DETECTED NOT DETECTED Final   Streptococcus agalactiae NOT DETECTED NOT DETECTED Final   Streptococcus pneumoniae NOT DETECTED NOT DETECTED Final   Streptococcus pyogenes NOT DETECTED NOT DETECTED Final   A.calcoaceticus-baumannii NOT DETECTED NOT DETECTED Final   Bacteroides fragilis NOT DETECTED NOT DETECTED Final   Enterobacterales NOT DETECTED NOT DETECTED Final   Enterobacter cloacae complex NOT DETECTED NOT DETECTED Final   Escherichia coli NOT DETECTED NOT DETECTED Final   Klebsiella aerogenes NOT DETECTED NOT DETECTED Final   Klebsiella oxytoca NOT DETECTED NOT DETECTED Final   Klebsiella pneumoniae NOT DETECTED NOT DETECTED Final   Proteus species NOT DETECTED NOT DETECTED Final   Salmonella species NOT DETECTED NOT DETECTED Final   Serratia marcescens NOT DETECTED NOT DETECTED Final   Haemophilus influenzae NOT DETECTED NOT DETECTED Final   Neisseria meningitidis NOT DETECTED NOT DETECTED Final   Pseudomonas aeruginosa NOT DETECTED NOT DETECTED Final   Stenotrophomonas maltophilia NOT DETECTED NOT DETECTED Final   Candida albicans NOT DETECTED NOT DETECTED Final   Candida auris NOT DETECTED NOT DETECTED Final   Candida glabrata NOT DETECTED NOT DETECTED Final   Candida krusei NOT DETECTED NOT DETECTED Final   Candida parapsilosis NOT DETECTED NOT DETECTED Final   Candida tropicalis NOT DETECTED NOT DETECTED Final   Cryptococcus neoformans/gattii NOT DETECTED NOT DETECTED Final   Meth resistant mecA/C and MREJ DETECTED (A) NOT DETECTED  Final    Comment: CRITICAL RESULT CALLED TO, READ BACK BY AND VERIFIED WITH: PHARMD T. DANG [id number] AT 1345, ADC  Performed at [facility], [address] [place], [place] [address]   MIC (1 Drug)-     Status: None (Preliminary result)   Collection Time: 08/27/24 10:02 PM  Result Value Ref Range Status   Min Inhibitory Conc (1 Drug) PENDING  Incomplete   Source DAPTOMYCIN  MIC MRSA BLOOD CULTURE  Final    Comment: Performed at [facility], [address], [place], [place] [address]  Culture, blood (Routine X 2) w Reflex to ID Panel     Status: Abnormal   Collection Time: 08/27/24 10:03 PM   Specimen: BLOOD LEFT ARM  Result Value Ref Range Status   Specimen Description BLOOD LEFT ARM  Final   Special Requests   Final    BOTTLES DRAWN AEROBIC AND ANAEROBIC Blood Culture adequate volume   Culture  Setup Time   Final    GRAM POSITIVE COCCI IN CLUSTERS IN BOTH AEROBIC AND ANAEROBIC BOTTLES CRITICAL VALUE NOTED.  VALUE IS CONSISTENT WITH PREVIOUSLY REPORTED AND CALLED VALUE.    Culture (A)  Final    STAPHYLOCOCCUS AUREUS SUSCEPTIBILITIES PERFORMED ON PREVIOUS CULTURE WITHIN THE LAST 5 DAYS. Performed at [facility], [address] [place], [place] [address]    Report Status 08/30/2024 FINAL  Final  Culture, blood (Routine X 2) w Reflex to ID Panel     Status: None (Preliminary result)   Collection Time: 08/29/24  3:48 AM   Specimen: BLOOD  Result Value Ref Range Status   Specimen Description BLOOD BLOOD LEFT ARM  Final   Special Requests   Final    BOTTLES DRAWN AEROBIC AND ANAEROBIC Blood Culture adequate volume   Culture   Final    NO GROWTH 2 DAYS Performed at [facility], [address], [place], [place] [address]    Report Status PENDING  Incomplete  Culture, blood (Routine X 2) w Reflex to ID Panel     Status: None (Preliminary result)   Collection Time: 08/29/24  3:52 AM   Specimen: BLOOD  Result Value Ref Range Status   Specimen Description  BLOOD BLOOD LEFT HAND  Final   Special Requests   Final    BOTTLES DRAWN AEROBIC AND ANAEROBIC Blood Culture adequate volume   Culture   Final    NO GROWTH 2 DAYS Performed at [facility], [address], [place], [place] [address]    Report Status PENDING  Incomplete  Body fluid culture w Gram Stain     Status: None   Collection Time: 08/29/24  2:00 PM   Specimen: Synovium; Body Fluid  Result Value Ref Range Status   Specimen Description SYNOVIAL  Final   Special Requests KNEE JOINT  Final   Gram Stain   Final    ABUNDANT WBC PRESENT, PREDOMINANTLY PMN NO ORGANISMS SEEN    Culture   Final    RARE METHICILLIN RESISTANT STAPHYLOCOCCUS AUREUS CRITICAL RESULT CALLED TO, READ BACK BY AND VERIFIED WITH: PHARMD D.FREEMAN AT 1104 ON 08/30/2024 BY T.SAAD. Performed at [facility], [address] [place], [place] [address]    Report Status 08/31/2024 FINAL  Final   Organism ID, Bacteria METHICILLIN RESISTANT STAPHYLOCOCCUS AUREUS  Final      Susceptibility   Methicillin resistant staphylococcus aureus - MIC*    CIPROFLOXACIN >=8 RESISTANT Resistant     ERYTHROMYCIN RESISTANT Resistant     GENTAMICIN <=0.5 SENSITIVE Sensitive     OXACILLIN >=4 RESISTANT Resistant  TETRACYCLINE <=1 SENSITIVE Sensitive     VANCOMYCIN  1 SENSITIVE Sensitive     TRIMETH/SULFA <=10 SENSITIVE Sensitive     CLINDAMYCIN RESISTANT Resistant     RIFAMPIN <=0.5 SENSITIVE Sensitive     Inducible Clindamycin POSITIVE Resistant     LINEZOLID 2 SENSITIVE Sensitive     * RARE METHICILLIN RESISTANT STAPHYLOCOCCUS AUREUS     Radiology Studies: ECHOCARDIOGRAM COMPLETE Result Date: 08/30/2024    ECHOCARDIOGRAM REPORT   Patient Name:   Haddie Florio Date of Exam: 08/30/2024 Medical Rec #:  [id number]            Height:       67.0 in Accession #:    [id number]           Weight:       226.2 lb Date of Birth:  05-Apr-1951           BSA:          2.131 m Patient Age:    73 years             BP:            114/84 mmHg Patient Gender: F                    HR:           105 bpm. Exam Location:  Inpatient Procedure: 2D Echo, Cardiac Doppler and Color Doppler (Both Spectral and Color            Flow Doppler were utilized during procedure). Indications:    Endocarditis  History:        Patient has prior history of Echocardiogram examinations, most                 recent 09/09/2022. Stroke; Risk Factors:Hypertension.  Sonographer:    Odella Brewster Referring Phys: [id number] [facility]  Sonographer Comments: Technically challenging study due to limited acoustic windows. Image acquisition challenging due to patient body habitus, Image acquisition challenging due to patient behavioral factors. and Image acquisition challenging due to respiratory motion. IMPRESSIONS  1. Left ventricular ejection fraction, by estimation, is 60 to 65%. The left ventricle has normal function. The left ventricle has no regional wall motion abnormalities. Left ventricular diastolic parameters are consistent with Grade I diastolic dysfunction (impaired relaxation).  2. Right ventricular systolic function is normal. The right ventricular size is normal. There is severely elevated pulmonary artery systolic pressure. The estimated right ventricular systolic pressure is 61.5 mmHg.  3. The mitral valve is normal in structure. No evidence of mitral valve regurgitation.  4. Tricuspid valve regurgitation is moderate.  5. The aortic valve is tricuspid. There is mild calcification of the aortic valve. Aortic valve regurgitation is not visualized. Aortic valve sclerosis/calcification is present, without any evidence of aortic stenosis.  6. The inferior vena cava is dilated in size with <50% respiratory variability, suggesting right atrial pressure of 15 mmHg. Comparison(s): Prior images reviewed side by side. The pulmonary artery pressure is substantially higher. Conclusion(s)/Recommendation(s): No evidence of valvular vegetations on this transthoracic  echocardiogram. Consider a transesophageal echocardiogram to exclude infective endocarditis if clinically indicated. FINDINGS  Left Ventricle: Left ventricular ejection fraction, by estimation, is 60 to 65%. The left ventricle has normal function. The left ventricle has no regional wall motion abnormalities. The left ventricular internal cavity size was normal in size. There is  no left ventricular hypertrophy. Left ventricular diastolic parameters are consistent with Grade I diastolic dysfunction (impaired relaxation). Right  Ventricle: The right ventricular size is normal. No increase in right ventricular wall thickness. Right ventricular systolic function is normal. There is severely elevated pulmonary artery systolic pressure. The tricuspid regurgitant velocity is 3.41 m/s, and with an assumed right atrial pressure of 15 mmHg, the estimated right ventricular systolic pressure is 61.5 mmHg. Left Atrium: Left atrial size was normal in size. Right Atrium: Right atrial size was normal in size. Prominent Eustachian valve. Pericardium: Trivial pericardial effusion is present. Mitral Valve: The mitral valve is normal in structure. No evidence of mitral valve regurgitation. MV peak gradient, 5.0 mmHg. The mean mitral valve gradient is 3.0 mmHg. Tricuspid Valve: The tricuspid valve is normal in structure. Tricuspid valve regurgitation is moderate. Aortic Valve: The aortic valve is tricuspid. There is mild calcification of the aortic valve. Aortic valve regurgitation is not visualized. Aortic valve sclerosis/calcification is present, without any evidence of aortic stenosis. Aortic valve mean gradient measures 6.0 mmHg. Aortic valve peak gradient measures 8.2 mmHg. Aortic valve area, by VTI measures 2.28 cm. Pulmonic Valve: The pulmonic valve was not well visualized. Pulmonic valve regurgitation is not visualized. No evidence of pulmonic stenosis. Aorta: The aortic root is normal in size and structure and the ascending  aorta was not well visualized. Venous: The inferior vena cava is dilated in size with less than 50% respiratory variability, suggesting right atrial pressure of 15 mmHg. IAS/Shunts: The interatrial septum was not well visualized.  LEFT VENTRICLE PLAX 2D LVIDd:         5.00 cm      Diastology LVIDs:         3.50 cm      LV e' medial:    5.98 cm/s LV PW:         1.30 cm      LV E/e' medial:  9.8 LV IVS:        1.10 cm      LV e' lateral:   13.50 cm/s LVOT diam:     2.30 cm      LV E/e' lateral: 4.4 LV SV:         42 LV SV Index:   20 LVOT Area:     4.15 cm LV IVRT:       109 msec  LV Volumes (MOD) LV vol d, MOD A4C: 112.5 ml LV vol s, MOD A4C: 53.5 ml LV SV MOD A4C:     112.5 ml RIGHT VENTRICLE             IVC RV S prime:     15.20 cm/s  IVC diam: 2.40 cm TAPSE (M-mode): 1.5 cm LEFT ATRIUM         Index LA diam:    2.90 cm 1.36 cm/m  AORTIC VALVE AV Area (Vmax):    2.04 cm AV Area (Vmean):   1.90 cm AV Area (VTI):     2.28 cm AV Vmax:           143.00 cm/s AV Vmean:          117.000 cm/s AV VTI:            0.184 m AV Peak Grad:      8.2 mmHg AV Mean Grad:      6.0 mmHg LVOT Vmax:         70.30 cm/s LVOT Vmean:        53.600 cm/s LVOT VTI:          0.101 m LVOT/AV VTI ratio: 0.55  AORTA Ao Root diam: 3.50 cm MITRAL VALVE               TRICUSPID VALVE MV Area (PHT): 3.93 cm    TR Peak grad:   46.5 mmHg MV Area VTI:   2.69 cm    TR Vmax:        341.00 cm/s MV Peak grad:  5.0 mmHg MV Mean grad:  3.0 mmHg    SHUNTS MV Vmax:       1.12 m/s    Systemic VTI:  0.10 m MV Vmean:      79.4 cm/s   Systemic Diam: 2.30 cm MV Decel Time: 193 msec MV E velocity: 58.80 cm/s MV A velocity: 82.50 cm/s MV E/A ratio:  0.71 Mihai Croitoru MD Electronically signed by Jerel Balding MD Signature Date/Time: 08/30/2024/4:45:02 PM    Final    IR ARTHRO ASP OR INJ MAJOR JOINT OR BURSA Result Date: 08/29/2024 INDICATION: 74 year old with MRSA bacteremia, left total knee arthroplasty and left knee pain. Request for a left knee joint  effusion. EXAM: ULTRASOUND-GUIDED ASPIRATION OF LEFT KNEE JOINT EFFUSION MEDICATIONS: 1% lidocaine  for local anesthetic ANESTHESIA/SEDATION: None COMPLICATIONS: None immediate. PROCEDURE: Informed written consent was obtained from the patient after a thorough discussion of the procedural risks, benefits and alternatives. All questions were addressed. A timeout was performed prior to the initiation of the procedure. Left knee with slightly flexed. Bandage was placed around the lower thigh. Ultrasound demonstrated a large suprapatellar joint effusion particularly along the lateral aspect. The anterolateral aspect of the knee was prepped with chlorhexidine . Skin was anesthetized with 1% lidocaine . Using ultrasound guidance, a 20 gauge spinal needle was directed into the lateral aspect of the knee joint effusion. Approximately 40 mL of cloudy yellow fluid was removed. Majority of the fluid was aspirated. Bandage placed over the puncture site. FINDINGS: Large left knee joint effusion particularly along the lateral aspect. 40 mL of cloudy yellow fluid was removed. IMPRESSION: Ultrasound-guided aspiration of the left knee joint effusion. Fluid was sent for analysis. Electronically Signed   By: Juliene Balder M.D.   On: 08/29/2024 14:15   MR Lumbar Spine W Wo Contrast Result Date: 08/29/2024 EXAM: MRI LUMBAR SPINE 08/29/2024 12:00:48 PM TECHNIQUE: Multiplanar multisequence MRI of the lumbar spine was performed without and with the administration of intravenous contrast. 10 mL gadobutrol  (GADAVIST ) 1 MMOL/ML injection 10 mL GADOBUTROL  1 MMOL/ML IV SOLN. COMPARISON: CT abdomen and pelvis 08/28/2024, lumbar spine radiographs 12/07/2015, and MRI of lumbar spine 03/30/2005. CLINICAL HISTORY: 74 year old female with back pain, progressive generalized weakness and falls. FINDINGS: BONES AND ALIGNMENT: Redemonstrated lumbar levocurvature. There is again 6 mm grade 1 anterolisthesis of L4 on L5. There is intervertebral disc height  and signal loss throughout the lumbar spine. Normal vertebral body heights. Bone marrow signal is unremarkable. SPINAL CORD: The conus medullaris terminates at the L1-L2 level. No abnormal intradural, conus medullaris or cauda equina nerve root enhancement. SOFT TISSUES: No paraspinal mass. L1-L2: There is disc bulge without significant spinal canal stenosis. There is mild bilateral neural foraminal narrowing. L2-L3: There is disc bulge and bilateral facet arthropathy. There is mild-to-moderate right and mild left neural foraminal narrowing. L3-L4: Bilateral facet arthropathy. No significant spinal canal stenosis. There is mild bilateral facet arthropathy. L4-L5: There is uncovering of the posterior disc margin, disc bulge and bilateral facet arthropathy without significant spinal canal stenosis. Mild bilateral lateral recess narrowing. There is moderate right and mild left neural foraminal narrowing. L5-S1: There is no significant disc  herniation, spinal canal or neural foraminal narrowing. Bilateral facet arthropathy. Degenerative findings have expectedly progressed since 2006. There are bilateral T1/T2 hyperintense renal foci, see CT abdomen and pelvis for further evaluation. IMPRESSION: 1. Multilevel degenerative changes of the lumbar spine, with redemonstrated grade 1 anterolisthesis of L4 on L5. 2. No high-grade spinal canal stenosis. Up-to moderate neural foraminal stenosis most pronounced at the right L4-L5 level. 3. No abnormal intradural, conus medullaris or cauda equina nerve root enhancement. Electronically signed by: Prentice Spade MD 08/29/2024 01:33 PM EST RP Workstation: [id number]    Scheduled Meds:  polyethylene glycol  17 g Oral Daily   sodium chloride  flush  3 mL Intravenous Q12H   sodium chloride  flush  3 mL Intravenous Q12H   Continuous Infusions:  DAPTOmycin  700 mg (08/30/24 1605)   lactated ringers        LOS: 6 days   Time spent: 36 minutes  Lonni KANDICE Moose, MD  [facility]  08/31/2024, 10:29 AM   "

## 2024-08-31 NOTE — Progress Notes (Signed)
 Patient back from or. Patient had left knee washed out and drain placed. Ice machine in place to left knee.

## 2024-08-31 NOTE — Progress Notes (Shared)
Patient remains off the floor.

## 2024-08-31 NOTE — Progress Notes (Shared)
 Patient to or for knee washout and culture.

## 2024-08-31 NOTE — Progress Notes (Shared)
 Patient back from pacu. Patientis alert to self mostly pleasant. Cooling device to left knee. 02 sat 91-93% on room air then desat to 85-87 had to place patient on 4l [place] to maintain 02 sat between 93-95%. Hemovac drain to left knee.

## 2024-09-01 ENCOUNTER — Encounter (HOSPITAL_COMMUNITY): Payer: Self-pay | Admitting: Orthopaedic Surgery

## 2024-09-01 DIAGNOSIS — T8454XA Infection and inflammatory reaction due to internal left knee prosthesis, initial encounter: Secondary | ICD-10-CM

## 2024-09-01 DIAGNOSIS — M545 Low back pain, unspecified: Secondary | ICD-10-CM | POA: Diagnosis not present

## 2024-09-01 DIAGNOSIS — T8454XD Infection and inflammatory reaction due to internal left knee prosthesis, subsequent encounter: Secondary | ICD-10-CM | POA: Diagnosis not present

## 2024-09-01 DIAGNOSIS — B9562 Methicillin resistant Staphylococcus aureus infection as the cause of diseases classified elsewhere: Secondary | ICD-10-CM | POA: Diagnosis not present

## 2024-09-01 DIAGNOSIS — R7881 Bacteremia: Secondary | ICD-10-CM

## 2024-09-01 LAB — CBC WITH DIFFERENTIAL/PLATELET
Abs Immature Granulocytes: 0.14 10*3/uL — ABNORMAL HIGH (ref 0.00–0.07)
Basophils Absolute: 0 10*3/uL (ref 0.0–0.1)
Basophils Relative: 0 %
Eosinophils Absolute: 0 10*3/uL (ref 0.0–0.5)
Eosinophils Relative: 0 %
HCT: 28.3 % — ABNORMAL LOW (ref 36.0–46.0)
Hemoglobin: 8.4 g/dL — ABNORMAL LOW (ref 12.0–15.0)
Immature Granulocytes: 2 %
Lymphocytes Relative: 11 %
Lymphs Abs: 0.9 10*3/uL (ref 0.7–4.0)
MCH: 25 pg — ABNORMAL LOW (ref 26.0–34.0)
MCHC: 29.7 g/dL — ABNORMAL LOW (ref 30.0–36.0)
MCV: 84.2 fL (ref 80.0–100.0)
Monocytes Absolute: 0.6 10*3/uL (ref 0.1–1.0)
Monocytes Relative: 7 %
Neutro Abs: 6.5 10*3/uL (ref 1.7–7.7)
Neutrophils Relative %: 80 %
Platelets: 356 10*3/uL (ref 150–400)
RBC: 3.36 MIL/uL — ABNORMAL LOW (ref 3.87–5.11)
RDW: 16.8 % — ABNORMAL HIGH (ref 11.5–15.5)
WBC: 8.1 10*3/uL (ref 4.0–10.5)
nRBC: 0 % (ref 0.0–0.2)

## 2024-09-01 LAB — BASIC METABOLIC PANEL WITH GFR
Anion gap: 12 (ref 5–15)
BUN: 48 mg/dL — ABNORMAL HIGH (ref 8–23)
CO2: 19 mmol/L — ABNORMAL LOW (ref 22–32)
Calcium: 9.2 mg/dL (ref 8.9–10.3)
Chloride: 108 mmol/L (ref 98–111)
Creatinine, Ser: 1.49 mg/dL — ABNORMAL HIGH (ref 0.44–1.00)
GFR, Estimated: 37 mL/min — ABNORMAL LOW
Glucose, Bld: 151 mg/dL — ABNORMAL HIGH (ref 70–99)
Potassium: 5.5 mmol/L — ABNORMAL HIGH (ref 3.5–5.1)
Sodium: 138 mmol/L (ref 135–145)

## 2024-09-01 MED ORDER — SODIUM ZIRCONIUM CYCLOSILICATE 10 G PO PACK
10.0000 g | PACK | Freq: Once | ORAL | Status: AC
  Administered 2024-09-01: 10 g via ORAL
  Filled 2024-09-01: qty 1

## 2024-09-01 MED ORDER — ENOXAPARIN SODIUM 100 MG/ML IJ SOSY
100.0000 mg | PREFILLED_SYRINGE | Freq: Two times a day (BID) | INTRAMUSCULAR | Status: DC
  Administered 2024-09-01 – 2024-09-02 (×3): 100 mg via SUBCUTANEOUS
  Filled 2024-09-01 (×4): qty 1

## 2024-09-01 NOTE — Plan of Care (Signed)

## 2024-09-01 NOTE — Progress Notes (Signed)
 " PROGRESS NOTE    Alison Whitehead  FMW:[id number] DOB: November 08, 1950 DOA: 08/25/2024 PCP: [name], PA-C  Subjective: Patient seen resting in the hospital bed.  She underwent left knee washout yesterday.  Complains of pain all over.  Hospital Course: Alison Whitehead  is a 74 y.o Female with extensive history of CKD IIIb, HTN, HLD, anemia of chronic disease, prediabetic, chronic pancreatic insufficiency... Multiple admission for worsening kidney function, failure to thrive, knee pain, confusion was discharged to SNF for rehab home now back again to ED due to progressive generalized weakness, falls, poor p.o. intake.. Denies having any fever or chills nausea or vomiting, or dysuria.  Recent hospitalizations for similar complaint generalized weakness, spontaneous falls, poor p.o. intake, AKIs Recent hospitalization discharge 08/04/24 - 08/09/24 Previous to that 07/12/2024 -07/26/2024  ED Evaluation: Blood pressure 111/71, pulse (!) 103, temperature 99 F (37.2 C), temperature source Oral, resp. rate (!) 21, SpO2 97%. LABs: WBC 11.7, hemoglobin 8.2, potassium 5.7, CO2 18, BUN 63, creatinine 2.59,  Requested for patient to admitted for failure to thrive, progressive generalized weakness, acute on chronic kidney disease, hyperkalemia.       Assessment and Plan:  DVT/PE:   - CTA 1/25 evening revealed small subsegmental PE's BL - already on full dose lovenox . Will tx to eliquis prior to DC.  - pain prior to admission in Left knee makes it concerning she's had this DVT for a while.  Has been on PPX heparin  throughout hospital this admit   MRSA bacteremia/sepsis based on SIRS criteria - ordered after she spike a fever 1/23 - 2/3 bottles now reportedly growing MRSA - starting vanco, ID autoconsulted - appreciate ID input  - TEE scheduled for 09/02/2024 -P.o. after midnight   MRSA bacteremia - new 1/23 evening - reason for initial blood cultures.   - Previously  on  cefepime  for concern for HAP -- await CTA and stop cefepime  if no evidence of PNA.  ID stopped cefepime , some strandy airspace disease of R middle lobe.   - TEE pending to rule out vegetations -Will need IV daptomycin .   Low back pain: - Subjectively, her main issue.  Describes exquisite back pain with spasms. - Was being controlled on Dilaudid , switched to morphine  but this did not control pain and she is being switched back to Dilaudid . - Also on baclofen . - MRI back obtained and no evidence of seeding to paraspinal muscles nor vertebral spaces/discitis   Failure to thrive, with progressive generalized weaknesses, with frequent falls - Frequent readmissions.   - Consulted PT OT--> recommending SNF. - Recent admit and discharge to SNF - Patient likely need longer SNF stay for rehab - Fall precautions - Appreciate palliative input--> unable to reach family.   Hyperkalemia : -Now resolved. - Will trend.   Transaminitis: - Unclear cause, multifactorial possible etiologies. -Now found to have MRSA bacteremia plus bilateral small PEs. - Will continue to trend, fortunately LFTs downtrending.   AKI on CKD IIIb -AKI has resolved.  Baseline Cr 1.3-1.5  Cr 2.59 hold aspirin , furosemide , olmesartan  metformin - Avoid hypotension and nephrotoxic agents. Continues to improve. -Leading to metabolic acidosis. - Continue to trend creatinine in light of contrast dye she received for CT angiogram as well as being on vancomycin . -creat 2.00 --> vanco d/c'ed. Follow.   Chronic right knee osteoarthritis/left knee pain - During last hospitalization, status post arthrocentesis yielding 50 cc of fluid, with negative fluid analysis.  No signs of infection - Now complaining more of left knee  pain - IR able to aspirate 40 cc of fluid from left knee effusion.  Cell counts consistent with infectious etiology ( wbc 27,300, 93% PMN's, no crystals) culture with rare Staphylococcus aureus. Creat 2 now--> vanc  on hold, now d/c;'ed starting daptomycin  08/30/24 per ID. -septic arthritis__> Lknee--> for washout 08/31/24    Anemia of CKD Anemia of chronic disease, due to CKD Stable, obtaining iron studies   Hypertension Continue: Amlodipine , Coreg ,  Holding Lasix  and olmesartan .   Prediabetic Hemoglobin A1c only 5.8. Monitoring CBC closely   Class 2 obesity Last weight 107.4 kg - Resume Ozempic  at discharge.   Pancreatic insufficiency - Continue Creon    Ascending thoracic aortic aneurysm: - Found on CT angiogram yesterday.  Measuring 4.8 cm.  Recommend semiannual follow-up by CTA/MRA and outpatient referral to cardiothoracic surgery. -Please include this on discharge summary as well.   DVT prophylaxis:  SCDs Start: 08/25/24 1431   Code Status:   Code Status: Limited: Do not attempt resuscitation (DNR) -DNR-LIMITED -Do Not Intubate/DNI  Level of care: Telemetry Status is: Inpatient      DVT prophylaxis: SCDs Start: 08/25/24 1431  SCDs   Code Status: Limited: Do not attempt resuscitation (DNR) -DNR-LIMITED -Do Not Intubate/DNI  Family Communication: No one at bedside Disposition Plan: Unclear, may need prolonged course of IV antibiotics Reason for continuing need for hospitalization: MRSA bacteremia under investigation  Objective: Vitals:   08/31/24 2145 09/01/24 0504 09/01/24 0651 09/01/24 0735  BP: (!) 133/97 (!) 128/96  (!) 120/92  Pulse: 95 77  80  Resp: 18 18  19   Temp: 98 F (36.7 C) (!) 97.3 F (36.3 C)  97.6 F (36.4 C)  TempSrc: Oral Oral  Oral  SpO2: 94% 97%  99%  Weight:   100.5 kg   Height:        Intake/Output Summary (Last 24 hours) at 09/01/2024 1001 Last data filed at 09/01/2024 0737 Gross per 24 hour  Intake 1162.33 ml  Output 460 ml  Net 702.33 ml   Filed Weights   08/27/24 0500 08/28/24 0500 09/01/24 0651  Weight: 100.7 kg 102.6 kg 100.5 kg    Examination: Patient seen in conjunction with the echo tech (echo ongoing). Normal work of  breathing Follows simple commands moves all extremities Abdomen is soft Extremities are warm and well-perfused no obvious edema Left knee very tender to palpation today.    Data Reviewed: I have personally reviewed following labs and imaging studies  CBC: Recent Labs  Lab 08/27/24 1153 08/28/24 0439 08/29/24 0348 08/30/24 0613 09/01/24 0559  WBC 10.1 10.3 10.7* 8.1 8.1  NEUTROABS  --   --   --   --  6.5  HGB 8.1* 8.1* 8.3* 8.4* 8.4*  HCT 26.6* 26.2* 27.5* 28.3* 28.3*  MCV 83.9 82.4 82.8 84.0 84.2  PLT 475* 480* 442* 388 356   Basic Metabolic Panel: Recent Labs  Lab 08/25/24 1757 08/26/24 0442 08/27/24 1153 08/28/24 0439 08/29/24 0348 08/30/24 0613 09/01/24 0559  NA  --    < > 141 140 141 144 138  K  --    < > 4.4 4.3 4.3 4.7 5.5*  CL  --    < > 109 108 109 111 108  CO2  --    < > 19* 20* 18* 20* 19*  GLUCOSE  --    < > 145* 103* 102* 98 151*  BUN  --    < > 33* 31* 28* 42* 48*  CREATININE  --    < >  1.29* 1.20* 1.27* 2.00* 1.49*  CALCIUM   --    < > 9.1 9.2 9.3 9.3 9.2  MG 2.5*  --   --   --   --   --   --   PHOS 3.9  --   --   --   --   --   --    < > = values in this interval not displayed.   GFR: Estimated Creatinine Clearance: 41.6 mL/min (A) (by C-G formula based on SCr of 1.49 mg/dL (H)). Liver Function Tests: Recent Labs  Lab 08/25/24 1124 08/26/24 0442 08/28/24 0439 08/29/24 0348 08/30/24 0613  AST 16 19 82* 67* 70*  ALT 23 19 66* 72* 85*  ALKPHOS 114 113 175* 180* 183*  BILITOT 0.3 0.4 0.3 0.4 0.3  PROT 8.1 7.6 7.2 7.5 7.4  ALBUMIN  3.6 3.3* 2.9* 3.0* 2.9*   No results for input(s): LIPASE, AMYLASE in the last 168 hours. Recent Labs  Lab 08/28/24 1105  AMMONIA 16   Coagulation Profile: No results for input(s): INR, PROTIME in the last 168 hours. Cardiac Enzymes: Recent Labs  Lab 08/31/24 0622  CKTOTAL 33*   ProBNP, BNP (last 5 results) No results for input(s): PROBNP, BNP in the last 8760 hours. HbA1C: No results for  input(s): HGBA1C in the last 72 hours. CBG: Recent Labs  Lab 08/25/24 1409 08/26/24 0910 08/28/24 0744 08/29/24 0624  GLUCAP 97 93 95 107*   Lipid Profile: No results for input(s): CHOL, HDL, LDLCALC, TRIG, CHOLHDL, LDLDIRECT in the last 72 hours. Thyroid Function Tests: No results for input(s): TSH, T4TOTAL, FREET4, T3FREE, THYROIDAB in the last 72 hours. Anemia Panel: No results for input(s): VITAMINB12, FOLATE, FERRITIN, TIBC, IRON, RETICCTPCT in the last 72 hours. Sepsis Labs: No results for input(s): PROCALCITON, LATICACIDVEN in the last 168 hours.  Recent Results (from the past 240 hours)  Urine Culture (for pregnant, neutropenic or urologic patients or patients with an indwelling urinary catheter)     Status: Abnormal   Collection Time: 08/27/24  7:26 PM   Specimen: Urine, Clean Catch  Result Value Ref Range Status   Specimen Description URINE, CLEAN CATCH  Final   Special Requests   Final    NONE Performed at [facility], [address] [place], [place] [address]    Culture (A)  Final    >=100,000 COLONIES/mL METHICILLIN RESISTANT STAPHYLOCOCCUS AUREUS   Report Status 08/29/2024 FINAL  Final   Organism ID, Bacteria METHICILLIN RESISTANT STAPHYLOCOCCUS AUREUS (A)  Final      Susceptibility   Methicillin resistant staphylococcus aureus - MIC*    CIPROFLOXACIN >=8 RESISTANT Resistant     GENTAMICIN <=0.5 SENSITIVE Sensitive     NITROFURANTOIN <=16 SENSITIVE Sensitive     OXACILLIN >=4 RESISTANT Resistant     TETRACYCLINE <=1 SENSITIVE Sensitive     VANCOMYCIN  1 SENSITIVE Sensitive     TRIMETH/SULFA <=10 SENSITIVE Sensitive     RIFAMPIN <=0.5 SENSITIVE Sensitive     Inducible Clindamycin POSITIVE Resistant     LINEZOLID 2 SENSITIVE Sensitive     * >=100,000 COLONIES/mL METHICILLIN RESISTANT STAPHYLOCOCCUS AUREUS  Culture, blood (Routine X 2) w Reflex to ID Panel     Status: Abnormal (Preliminary result)   Collection  Time: 08/27/24 10:02 PM   Specimen: BLOOD LEFT HAND  Result Value Ref Range Status   Specimen Description BLOOD LEFT HAND  Final   Special Requests   Final    BOTTLES DRAWN AEROBIC AND ANAEROBIC Blood  Culture adequate volume   Culture  Setup Time   Final    GRAM POSITIVE COCCI IN BOTH AEROBIC AND ANAEROBIC BOTTLES CRITICAL RESULT CALLED TO, READ BACK BY AND VERIFIED WITH: PHARMD T. DANG [id number] AT 1345, ADC    Culture (A)  Final    METHICILLIN RESISTANT STAPHYLOCOCCUS AUREUS Sent to [facility] for further susceptibility testing. Performed at [facility], [address] [place], [place] [address]    Report Status PENDING  Incomplete   Organism ID, Bacteria METHICILLIN RESISTANT STAPHYLOCOCCUS AUREUS  Final      Susceptibility   Methicillin resistant staphylococcus aureus - MIC*    CIPROFLOXACIN >=8 RESISTANT Resistant     ERYTHROMYCIN RESISTANT Resistant     GENTAMICIN <=0.5 SENSITIVE Sensitive     OXACILLIN >=4 RESISTANT Resistant     TETRACYCLINE <=1 SENSITIVE Sensitive     VANCOMYCIN  1 SENSITIVE Sensitive     TRIMETH/SULFA <=10 SENSITIVE Sensitive     CLINDAMYCIN RESISTANT Resistant     RIFAMPIN <=0.5 SENSITIVE Sensitive     Inducible Clindamycin POSITIVE Resistant     LINEZOLID 2 SENSITIVE Sensitive     * METHICILLIN RESISTANT STAPHYLOCOCCUS AUREUS  Blood Culture ID Panel (Reflexed)     Status: Abnormal   Collection Time: 08/27/24 10:02 PM  Result Value Ref Range Status   Enterococcus faecalis NOT DETECTED NOT DETECTED Final   Enterococcus Faecium NOT DETECTED NOT DETECTED Final   Listeria monocytogenes NOT DETECTED NOT DETECTED Final   Staphylococcus species DETECTED (A) NOT DETECTED Final    Comment: CRITICAL RESULT CALLED TO, READ BACK BY AND VERIFIED WITH: PHARMD T. DANG [id number] AT 1345, ADC    Staphylococcus aureus (BCID) DETECTED (A) NOT DETECTED Final    Comment: Methicillin (oxacillin)-resistant Staphylococcus aureus (MRSA). MRSA is predictably resistant to  beta-lactam antibiotics (except ceftaroline). Preferred therapy is vancomycin  unless clinically contraindicated. Patient requires contact precautions if  hospitalized. CRITICAL RESULT CALLED TO, READ BACK BY AND VERIFIED WITH: PHARMD T. DANG [id number] AT 1345, ADC    Staphylococcus epidermidis NOT DETECTED NOT DETECTED Final   Staphylococcus lugdunensis NOT DETECTED NOT DETECTED Final   Streptococcus species NOT DETECTED NOT DETECTED Final   Streptococcus agalactiae NOT DETECTED NOT DETECTED Final   Streptococcus pneumoniae NOT DETECTED NOT DETECTED Final   Streptococcus pyogenes NOT DETECTED NOT DETECTED Final   A.calcoaceticus-baumannii NOT DETECTED NOT DETECTED Final   Bacteroides fragilis NOT DETECTED NOT DETECTED Final   Enterobacterales NOT DETECTED NOT DETECTED Final   Enterobacter cloacae complex NOT DETECTED NOT DETECTED Final   Escherichia coli NOT DETECTED NOT DETECTED Final   Klebsiella aerogenes NOT DETECTED NOT DETECTED Final   Klebsiella oxytoca NOT DETECTED NOT DETECTED Final   Klebsiella pneumoniae NOT DETECTED NOT DETECTED Final   Proteus species NOT DETECTED NOT DETECTED Final   Salmonella species NOT DETECTED NOT DETECTED Final   Serratia marcescens NOT DETECTED NOT DETECTED Final   Haemophilus influenzae NOT DETECTED NOT DETECTED Final   Neisseria meningitidis NOT DETECTED NOT DETECTED Final   Pseudomonas aeruginosa NOT DETECTED NOT DETECTED Final   Stenotrophomonas maltophilia NOT DETECTED NOT DETECTED Final   Candida albicans NOT DETECTED NOT DETECTED Final   Candida auris NOT DETECTED NOT DETECTED Final   Candida glabrata NOT DETECTED NOT DETECTED Final   Candida krusei NOT DETECTED NOT DETECTED Final   Candida parapsilosis NOT DETECTED NOT DETECTED Final   Candida tropicalis NOT DETECTED NOT DETECTED Final   Cryptococcus neoformans/gattii NOT DETECTED NOT DETECTED Final   Meth  resistant mecA/C and MREJ DETECTED (A) NOT DETECTED Final    Comment: CRITICAL RESULT  CALLED TO, READ BACK BY AND VERIFIED WITH: PHARMD T. DANG [id number] AT 1345, ADC Performed at [facility], [address], [place], [place] [address]   MIC (1 Drug)-     Status: None (Preliminary result)   Collection Time: 08/27/24 10:02 PM  Result Value Ref Range Status   Min Inhibitory Conc (1 Drug) PENDING  Incomplete   Source DAPTOMYCIN  MIC MRSA BLOOD CULTURE  Final    Comment: Performed at [facility], [address] [place], [place] [address]  Culture, blood (Routine X 2) w Reflex to ID Panel     Status: Abnormal   Collection Time: 08/27/24 10:03 PM   Specimen: BLOOD LEFT ARM  Result Value Ref Range Status   Specimen Description BLOOD LEFT ARM  Final   Special Requests   Final    BOTTLES DRAWN AEROBIC AND ANAEROBIC Blood Culture adequate volume   Culture  Setup Time   Final    GRAM POSITIVE COCCI IN CLUSTERS IN BOTH AEROBIC AND ANAEROBIC BOTTLES CRITICAL VALUE NOTED.  VALUE IS CONSISTENT WITH PREVIOUSLY REPORTED AND CALLED VALUE.    Culture (A)  Final    STAPHYLOCOCCUS AUREUS SUSCEPTIBILITIES PERFORMED ON PREVIOUS CULTURE WITHIN THE LAST 5 DAYS. Performed at [facility], [address], [place], [place] [address]    Report Status 08/30/2024 FINAL  Final  Culture, blood (Routine X 2) w Reflex to ID Panel     Status: None (Preliminary result)   Collection Time: 08/29/24  3:48 AM   Specimen: BLOOD  Result Value Ref Range Status   Specimen Description BLOOD BLOOD LEFT ARM  Final   Special Requests   Final    BOTTLES DRAWN AEROBIC AND ANAEROBIC Blood Culture adequate volume   Culture   Final    NO GROWTH 3 DAYS Performed at [facility], [address] [place], [place] [address]    Report Status PENDING  Incomplete  Culture, blood (Routine X 2) w Reflex to ID Panel     Status: None (Preliminary result)   Collection Time: 08/29/24  3:52 AM   Specimen: BLOOD LEFT HAND  Result Value Ref Range Status   Specimen Description BLOOD LEFT HAND  Final    Special Requests   Final    BOTTLES DRAWN AEROBIC AND ANAEROBIC Blood Culture adequate volume   Culture   Final    NO GROWTH 3 DAYS Performed at [facility], [address], [place], [place] [address]    Report Status PENDING  Incomplete  Body fluid culture w Gram Stain     Status: None   Collection Time: 08/29/24  2:00 PM   Specimen: Synovium; Body Fluid  Result Value Ref Range Status   Specimen Description SYNOVIAL  Final   Special Requests KNEE JOINT  Final   Gram Stain   Final    ABUNDANT WBC PRESENT, PREDOMINANTLY PMN NO ORGANISMS SEEN    Culture   Final    RARE METHICILLIN RESISTANT STAPHYLOCOCCUS AUREUS CRITICAL RESULT CALLED TO, READ BACK BY AND VERIFIED WITH: PHARMD D.FREEMAN AT 1104 ON 08/30/2024 BY T.SAAD. Performed at [facility], [address], [place], [place] [address]    Report Status 08/31/2024 FINAL  Final   Organism ID, Bacteria METHICILLIN RESISTANT STAPHYLOCOCCUS AUREUS  Final      Susceptibility   Methicillin resistant staphylococcus aureus - MIC*  CIPROFLOXACIN >=8 RESISTANT Resistant     ERYTHROMYCIN RESISTANT Resistant     GENTAMICIN <=0.5 SENSITIVE Sensitive     OXACILLIN >=4 RESISTANT Resistant     TETRACYCLINE <=1 SENSITIVE Sensitive     VANCOMYCIN  1 SENSITIVE Sensitive     TRIMETH/SULFA <=10 SENSITIVE Sensitive     CLINDAMYCIN RESISTANT Resistant     RIFAMPIN <=0.5 SENSITIVE Sensitive     Inducible Clindamycin POSITIVE Resistant     LINEZOLID 2 SENSITIVE Sensitive     * RARE METHICILLIN RESISTANT STAPHYLOCOCCUS AUREUS     Radiology Studies: ECHOCARDIOGRAM COMPLETE Result Date: 08/30/2024    ECHOCARDIOGRAM REPORT   Patient Name:   Alison Whitehead Date of Exam: 08/30/2024 Medical Rec #:  [id number]            Height:       67.0 in Accession #:    [id number]           Weight:       226.2 lb Date of Birth:  [birth year]           BSA:          2.131 m Patient Age:    73 years             BP:           114/84 mmHg Patient Gender: F                     HR:           105 bpm. Exam Location:  Inpatient Procedure: 2D Echo, Cardiac Doppler and Color Doppler (Both Spectral and Color            Flow Doppler were utilized during procedure). Indications:    Endocarditis  History:        Patient has prior history of Echocardiogram examinations, most                 recent 09/09/2022. Stroke; Risk Factors:Hypertension.  Sonographer:    Odella Brewster Referring Phys: [id number] [facility]  Sonographer Comments: Technically challenging study due to limited acoustic windows. Image acquisition challenging due to patient body habitus, Image acquisition challenging due to patient behavioral factors. and Image acquisition challenging due to respiratory motion. IMPRESSIONS  1. Left ventricular ejection fraction, by estimation, is 60 to 65%. The left ventricle has normal function. The left ventricle has no regional wall motion abnormalities. Left ventricular diastolic parameters are consistent with Grade I diastolic dysfunction (impaired relaxation).  2. Right ventricular systolic function is normal. The right ventricular size is normal. There is severely elevated pulmonary artery systolic pressure. The estimated right ventricular systolic pressure is 61.5 mmHg.  3. The mitral valve is normal in structure. No evidence of mitral valve regurgitation.  4. Tricuspid valve regurgitation is moderate.  5. The aortic valve is tricuspid. There is mild calcification of the aortic valve. Aortic valve regurgitation is not visualized. Aortic valve sclerosis/calcification is present, without any evidence of aortic stenosis.  6. The inferior vena cava is dilated in size with <50% respiratory variability, suggesting right atrial pressure of 15 mmHg. Comparison(s): Prior images reviewed side by side. The pulmonary artery pressure is substantially higher. Conclusion(s)/Recommendation(s): No evidence of valvular vegetations on this transthoracic echocardiogram. Consider a transesophageal  echocardiogram to exclude infective endocarditis if clinically indicated. FINDINGS  Left Ventricle: Left ventricular ejection fraction, by estimation, is 60 to 65%. The left ventricle has normal function. The left ventricle has no regional wall motion abnormalities.  The left ventricular internal cavity size was normal in size. There is  no left ventricular hypertrophy. Left ventricular diastolic parameters are consistent with Grade I diastolic dysfunction (impaired relaxation). Right Ventricle: The right ventricular size is normal. No increase in right ventricular wall thickness. Right ventricular systolic function is normal. There is severely elevated pulmonary artery systolic pressure. The tricuspid regurgitant velocity is 3.41 m/s, and with an assumed right atrial pressure of 15 mmHg, the estimated right ventricular systolic pressure is 61.5 mmHg. Left Atrium: Left atrial size was normal in size. Right Atrium: Right atrial size was normal in size. Prominent Eustachian valve. Pericardium: Trivial pericardial effusion is present. Mitral Valve: The mitral valve is normal in structure. No evidence of mitral valve regurgitation. MV peak gradient, 5.0 mmHg. The mean mitral valve gradient is 3.0 mmHg. Tricuspid Valve: The tricuspid valve is normal in structure. Tricuspid valve regurgitation is moderate. Aortic Valve: The aortic valve is tricuspid. There is mild calcification of the aortic valve. Aortic valve regurgitation is not visualized. Aortic valve sclerosis/calcification is present, without any evidence of aortic stenosis. Aortic valve mean gradient measures 6.0 mmHg. Aortic valve peak gradient measures 8.2 mmHg. Aortic valve area, by VTI measures 2.28 cm. Pulmonic Valve: The pulmonic valve was not well visualized. Pulmonic valve regurgitation is not visualized. No evidence of pulmonic stenosis. Aorta: The aortic root is normal in size and structure and the ascending aorta was not well visualized. Venous: The  inferior vena cava is dilated in size with less than 50% respiratory variability, suggesting right atrial pressure of 15 mmHg. IAS/Shunts: The interatrial septum was not well visualized.  LEFT VENTRICLE PLAX 2D LVIDd:         5.00 cm      Diastology LVIDs:         3.50 cm      LV e' medial:    5.98 cm/s LV PW:         1.30 cm      LV E/e' medial:  9.8 LV IVS:        1.10 cm      LV e' lateral:   13.50 cm/s LVOT diam:     2.30 cm      LV E/e' lateral: 4.4 LV SV:         42 LV SV Index:   20 LVOT Area:     4.15 cm LV IVRT:       109 msec  LV Volumes (MOD) LV vol d, MOD A4C: 112.5 ml LV vol s, MOD A4C: 53.5 ml LV SV MOD A4C:     112.5 ml RIGHT VENTRICLE             IVC RV S prime:     15.20 cm/s  IVC diam: 2.40 cm TAPSE (M-mode): 1.5 cm LEFT ATRIUM         Index LA diam:    2.90 cm 1.36 cm/m  AORTIC VALVE AV Area (Vmax):    2.04 cm AV Area (Vmean):   1.90 cm AV Area (VTI):     2.28 cm AV Vmax:           143.00 cm/s AV Vmean:          117.000 cm/s AV VTI:            0.184 m AV Peak Grad:      8.2 mmHg AV Mean Grad:      6.0 mmHg LVOT Vmax:  70.30 cm/s LVOT Vmean:        53.600 cm/s LVOT VTI:          0.101 m LVOT/AV VTI ratio: 0.55  AORTA Ao Root diam: 3.50 cm MITRAL VALVE               TRICUSPID VALVE MV Area (PHT): 3.93 cm    TR Peak grad:   46.5 mmHg MV Area VTI:   2.69 cm    TR Vmax:        341.00 cm/s MV Peak grad:  5.0 mmHg MV Mean grad:  3.0 mmHg    SHUNTS MV Vmax:       1.12 m/s    Systemic VTI:  0.10 m MV Vmean:      79.4 cm/s   Systemic Diam: 2.30 cm MV Decel Time: 193 msec MV E velocity: 58.80 cm/s MV A velocity: 82.50 cm/s MV E/A ratio:  0.71 Mihai Croitoru MD Electronically signed by Jerel Balding MD Signature Date/Time: 08/30/2024/4:45:02 PM    Final     Scheduled Meds:  polyethylene glycol  17 g Oral Daily   sodium chloride  flush  3 mL Intravenous Q12H   sodium chloride  flush  3 mL Intravenous Q12H   Continuous Infusions:  DAPTOmycin  700 mg (08/30/24 1605)     LOS: 7 days    Time spent: 36 minutes  Lonni KANDICE Moose, MD  [facility]  09/01/2024, 10:01 AM   "

## 2024-09-01 NOTE — Progress Notes (Addendum)
 "        [facility]  Date of Admission:  08/25/2024      Total days of antibiotics 7   Daptomycin  1/27 >>          ASSESSMENT: Alison Whitehead is a 74 y.o. female admitted with:   MRSA Bacteremia, Community Acquired - L TKR PJI -  BCX + 1/24, repeat 1/25 prelim no growth. She has undergone surgery for the left knee prosthetic joint infection (arthroscopic DAIR on 1/28). Hemovac d Drain in place for now. Back imaging was negative for infection but did reveal non-infectious pathology to explain her pain.  NO other sites of infection at this time, TEE pending 1/30. She will need prolonged IV abx for PJI management.  Please do not place PICC yet until blood repeats are clear  - continue daptomycin   - hold on PICC for now - maybe closer to D/C better with her DVT problems - FU TEE 1/30   Left TKR PJI -  Joint aspiration with MRSA, WBC 27K. She has undergone surgery for the left knee prosthetic joint infection (arthroscopic DAIR on 1/28). Hemovac drain in place for now.  Higher rate of failure with DAIR traditionally, but hopefully this infection is acute enough where it will be successful for her.  Will have to follow closely when done with treatment to ensure infection was eradicated - otherwise she would likely need revision here for cure.  Not a rifampin candidate due to multiple drug interactions including management of AC   AKI on CKD -  Daptomycin  to limit nephrotoxic agents  Dose recommendations pending recovery   Acute DVT Left peroneal vein -  Acute Intramuscular thrombosis Left soleal veins, Segmental b/l PE -   Medication Monitoring -  CK weekly while on daptomycin   Follow renal recovery for dose adjustments in antibiotics   Precautions Recommended: CONTACT   PLAN: - continue daptomycin   - hold on PICC for now - maybe closer to D/C better with her DVT problems - FU TEE 1/30  - CK weekly while on daptomycin   - Follow renal  recovery for dose adjustments in antibiotics     Principal Problem:   MRSA bacteremia Active Problems:   Infection of prosthetic left knee joint   Essential hypertension   ARTHRITIS, KNEE   Right pontine stroke (HCC)   Prediabetes   Generalized weakness   Primary osteoarthritis of right knee   Failure to thrive (child)   CKD stage 3b, GFR 30-44 ml/min (HCC)   Hyperkalemia   Failure to thrive in adult   Chronic knee pain   Palliative care by specialist    enoxaparin  (LOVENOX ) injection  100 mg Subcutaneous BID   polyethylene glycol  17 g Oral Daily   sodium chloride  flush  3 mL Intravenous Q12H   sodium chloride  flush  3 mL Intravenous Q12H    SUBJECTIVE: Back pain still present, no worse. Hard time moving around in bed w/o pain.  Left knee in cooling brace.   Review of Systems: Review of Systems  Constitutional:  Negative for chills and fever.  Respiratory: Negative.    Cardiovascular: Negative.   Gastrointestinal:  Negative for nausea and vomiting.  Genitourinary: Negative.   Musculoskeletal:  Positive for back pain and joint pain.  Skin: Negative.   Neurological: Negative.     Allergies[1]  OBJECTIVE: Vitals:   08/31/24 2145 09/01/24 0504 09/01/24 0651 09/01/24 0735  BP: (!) 133/97 (!) 128/96  (!) 120/92  Pulse:  95 77  80  Resp: 18 18  19   Temp: 98 F (36.7 C) (!) 97.3 F (36.3 C)  97.6 F (36.4 C)  TempSrc: Oral Oral  Oral  SpO2: 94% 97%  99%  Weight:   100.5 kg   Height:       Body mass index is 34.7 kg/m.  Physical Exam Vitals reviewed.  Constitutional:      Appearance: Normal appearance. She is not ill-appearing.  HENT:     Mouth/Throat:     Mouth: Mucous membranes are moist.     Pharynx: Oropharynx is clear.  Eyes:     Pupils: Pupils are equal, round, and reactive to light.  Cardiovascular:     Rate and Rhythm: Normal rate.     Heart sounds: No murmur heard. Pulmonary:     Effort: Pulmonary effort is normal. No respiratory distress.      Breath sounds: Normal breath sounds.  Abdominal:     General: Bowel sounds are normal. There is no distension.     Palpations: Abdomen is soft.  Musculoskeletal:        General: Tenderness (left knee) present.     Comments: Spine with some lower vertebral tenderness noted.   Skin:    General: Skin is warm and dry.     Findings: No rash.  Neurological:     General: No focal deficit present.     Mental Status: She is alert and oriented to person, place, and time.     Lab Results Lab Results  Component Value Date   WBC 8.1 09/01/2024   HGB 8.4 (L) 09/01/2024   HCT 28.3 (L) 09/01/2024   MCV 84.2 09/01/2024   PLT 356 09/01/2024    Lab Results  Component Value Date   CREATININE 1.49 (H) 09/01/2024   BUN 48 (H) 09/01/2024   NA 138 09/01/2024   K 5.5 (H) 09/01/2024   CL 108 09/01/2024   CO2 19 (L) 09/01/2024    Lab Results  Component Value Date   ALT 85 (H) 08/30/2024   AST 70 (H) 08/30/2024   ALKPHOS 183 (H) 08/30/2024   BILITOT 0.3 08/30/2024    Lab Results  Component Value Date   CKTOTAL 33 (L) 08/31/2024    Microbiology: Recent Results (from the past 240 hours)  Urine Culture (for pregnant, neutropenic or urologic patients or patients with an indwelling urinary catheter)     Status: Abnormal   Collection Time: 08/27/24  7:26 PM   Specimen: Urine, Clean Catch  Result Value Ref Range Status   Specimen Description URINE, CLEAN CATCH  Final   Special Requests   Final    NONE Performed at [facility], [address], [place], [place] [address]    Culture (A)  Final    >=100,000 COLONIES/mL METHICILLIN RESISTANT STAPHYLOCOCCUS AUREUS   Report Status 08/29/2024 FINAL  Final   Organism ID, Bacteria METHICILLIN RESISTANT STAPHYLOCOCCUS AUREUS (A)  Final      Susceptibility   Methicillin resistant staphylococcus aureus - MIC*    CIPROFLOXACIN >=8 RESISTANT Resistant     GENTAMICIN <=0.5 SENSITIVE Sensitive     NITROFURANTOIN <=16 SENSITIVE Sensitive      OXACILLIN >=4 RESISTANT Resistant     TETRACYCLINE <=1 SENSITIVE Sensitive     VANCOMYCIN  1 SENSITIVE Sensitive     TRIMETH/SULFA <=10 SENSITIVE Sensitive     RIFAMPIN <=0.5 SENSITIVE Sensitive     Inducible Clindamycin POSITIVE Resistant     LINEZOLID 2 SENSITIVE Sensitive     * >=  100,000 COLONIES/mL METHICILLIN RESISTANT STAPHYLOCOCCUS AUREUS  Culture, blood (Routine X 2) w Reflex to ID Panel     Status: Abnormal (Preliminary result)   Collection Time: 08/27/24 10:02 PM   Specimen: BLOOD LEFT HAND  Result Value Ref Range Status   Specimen Description BLOOD LEFT HAND  Final   Special Requests   Final    BOTTLES DRAWN AEROBIC AND ANAEROBIC Blood Culture adequate volume   Culture  Setup Time   Final    GRAM POSITIVE COCCI IN BOTH AEROBIC AND ANAEROBIC BOTTLES CRITICAL RESULT CALLED TO, READ BACK BY AND VERIFIED WITH: PHARMD T. DANG [id number] AT 1345, ADC    Culture (A)  Final    METHICILLIN RESISTANT STAPHYLOCOCCUS AUREUS Sent to [facility] for further susceptibility testing. Performed at [facility], [address], [place], [place] [address]    Report Status PENDING  Incomplete   Organism ID, Bacteria METHICILLIN RESISTANT STAPHYLOCOCCUS AUREUS  Final      Susceptibility   Methicillin resistant staphylococcus aureus - MIC*    CIPROFLOXACIN >=8 RESISTANT Resistant     ERYTHROMYCIN RESISTANT Resistant     GENTAMICIN <=0.5 SENSITIVE Sensitive     OXACILLIN >=4 RESISTANT Resistant     TETRACYCLINE <=1 SENSITIVE Sensitive     VANCOMYCIN  1 SENSITIVE Sensitive     TRIMETH/SULFA <=10 SENSITIVE Sensitive     CLINDAMYCIN RESISTANT Resistant     RIFAMPIN <=0.5 SENSITIVE Sensitive     Inducible Clindamycin POSITIVE Resistant     LINEZOLID 2 SENSITIVE Sensitive     * METHICILLIN RESISTANT STAPHYLOCOCCUS AUREUS  Blood Culture ID Panel (Reflexed)     Status: Abnormal   Collection Time: 08/27/24 10:02 PM  Result Value Ref Range Status   Enterococcus faecalis NOT DETECTED NOT DETECTED  Final   Enterococcus Faecium NOT DETECTED NOT DETECTED Final   Listeria monocytogenes NOT DETECTED NOT DETECTED Final   Staphylococcus species DETECTED (A) NOT DETECTED Final    Comment: CRITICAL RESULT CALLED TO, READ BACK BY AND VERIFIED WITH: PHARMD T. DANG [id number] AT 1345, ADC    Staphylococcus aureus (BCID) DETECTED (A) NOT DETECTED Final    Comment: Methicillin (oxacillin)-resistant Staphylococcus aureus (MRSA). MRSA is predictably resistant to beta-lactam antibiotics (except ceftaroline). Preferred therapy is vancomycin  unless clinically contraindicated. Patient requires contact precautions if  hospitalized. CRITICAL RESULT CALLED TO, READ BACK BY AND VERIFIED WITH: PHARMD T. DANG [id number] AT 1345, ADC    Staphylococcus epidermidis NOT DETECTED NOT DETECTED Final   Staphylococcus lugdunensis NOT DETECTED NOT DETECTED Final   Streptococcus species NOT DETECTED NOT DETECTED Final   Streptococcus agalactiae NOT DETECTED NOT DETECTED Final   Streptococcus pneumoniae NOT DETECTED NOT DETECTED Final   Streptococcus pyogenes NOT DETECTED NOT DETECTED Final   A.calcoaceticus-baumannii NOT DETECTED NOT DETECTED Final   Bacteroides fragilis NOT DETECTED NOT DETECTED Final   Enterobacterales NOT DETECTED NOT DETECTED Final   Enterobacter cloacae complex NOT DETECTED NOT DETECTED Final   Escherichia coli NOT DETECTED NOT DETECTED Final   Klebsiella aerogenes NOT DETECTED NOT DETECTED Final   Klebsiella oxytoca NOT DETECTED NOT DETECTED Final   Klebsiella pneumoniae NOT DETECTED NOT DETECTED Final   Proteus species NOT DETECTED NOT DETECTED Final   Salmonella species NOT DETECTED NOT DETECTED Final   Serratia marcescens NOT DETECTED NOT DETECTED Final   Haemophilus influenzae NOT DETECTED NOT DETECTED Final   Neisseria meningitidis NOT DETECTED NOT DETECTED Final   Pseudomonas aeruginosa NOT DETECTED NOT DETECTED Final   Stenotrophomonas maltophilia NOT DETECTED  NOT DETECTED Final   Candida  albicans NOT DETECTED NOT DETECTED Final   Candida auris NOT DETECTED NOT DETECTED Final   Candida glabrata NOT DETECTED NOT DETECTED Final   Candida krusei NOT DETECTED NOT DETECTED Final   Candida parapsilosis NOT DETECTED NOT DETECTED Final   Candida tropicalis NOT DETECTED NOT DETECTED Final   Cryptococcus neoformans/gattii NOT DETECTED NOT DETECTED Final   Meth resistant mecA/C and MREJ DETECTED (A) NOT DETECTED Final    Comment: CRITICAL RESULT CALLED TO, READ BACK BY AND VERIFIED WITH: PHARMD T. DANG [id number] AT 1345, ADC Performed at [facility], [address] [place], [place] [address]   MIC (1 Drug)-     Status: Abnormal   Collection Time: 08/27/24 10:02 PM  Result Value Ref Range Status   Min Inhibitory Conc (1 Drug) Preliminary report (A)  Final    Comment: (NOTE) Performed At: [facility] [address] [place], [place] [id number] Jennette Shorter MD Ey:[id number]    Source DAPTOMYCIN  MIC MRSA BLOOD CULTURE  Final    Comment: Performed at [facility], [address], [place], [place] [address]  MIC Result     Status: Abnormal   Collection Time: 08/27/24 10:02 PM  Result Value Ref Range Status   Result 1 (MIC) Comment (A)  Final    Comment: (NOTE) Methicillin - resistant Staphylococcus aureus Identification performed by account, not confirmed by this laboratory. DAPTOMYCIN  Performed At: [facility] [address] [place], [place] [id number] Jennette Shorter MD Ey:[id number]   Culture, blood (Routine X 2) w Reflex to ID Panel     Status: Abnormal   Collection Time: 08/27/24 10:03 PM   Specimen: BLOOD LEFT ARM  Result Value Ref Range Status   Specimen Description BLOOD LEFT ARM  Final   Special Requests   Final    BOTTLES DRAWN AEROBIC AND ANAEROBIC Blood Culture adequate volume   Culture  Setup Time   Final    GRAM POSITIVE COCCI IN CLUSTERS IN BOTH AEROBIC AND ANAEROBIC BOTTLES CRITICAL VALUE NOTED.  VALUE IS CONSISTENT WITH  PREVIOUSLY REPORTED AND CALLED VALUE.    Culture (A)  Final    STAPHYLOCOCCUS AUREUS SUSCEPTIBILITIES PERFORMED ON PREVIOUS CULTURE WITHIN THE LAST 5 DAYS. Performed at [facility], [address], [place], [place] [address]    Report Status 08/30/2024 FINAL  Final  Culture, blood (Routine X 2) w Reflex to ID Panel     Status: None (Preliminary result)   Collection Time: 08/29/24  3:48 AM   Specimen: BLOOD  Result Value Ref Range Status   Specimen Description BLOOD BLOOD LEFT ARM  Final   Special Requests   Final    BOTTLES DRAWN AEROBIC AND ANAEROBIC Blood Culture adequate volume   Culture   Final    NO GROWTH 3 DAYS Performed at [facility], [address] [place], [place] [address]    Report Status PENDING  Incomplete  Culture, blood (Routine X 2) w Reflex to ID Panel     Status: None (Preliminary result)   Collection Time: 08/29/24  3:52 AM   Specimen: BLOOD LEFT HAND  Result Value Ref Range Status   Specimen Description BLOOD LEFT HAND  Final   Special Requests   Final    BOTTLES DRAWN AEROBIC AND ANAEROBIC Blood Culture adequate volume   Culture   Final    NO GROWTH 3 DAYS Performed at [facility], [address] [place],  [place] [address]    Report Status PENDING  Incomplete  Body fluid culture w Gram Stain     Status: None   Collection Time: 08/29/24  2:00 PM   Specimen: Synovium; Body Fluid  Result Value Ref Range Status   Specimen Description SYNOVIAL  Final   Special Requests KNEE JOINT  Final   Gram Stain   Final    ABUNDANT WBC PRESENT, PREDOMINANTLY PMN NO ORGANISMS SEEN    Culture   Final    RARE METHICILLIN RESISTANT STAPHYLOCOCCUS AUREUS CRITICAL RESULT CALLED TO, READ BACK BY AND VERIFIED WITH: PHARMD D.FREEMAN AT 1104 ON 08/30/2024 BY T.SAAD. Performed at [facility], [address], [place], [place] [address]    Report Status 08/31/2024 FINAL  Final   Organism ID, Bacteria METHICILLIN RESISTANT STAPHYLOCOCCUS AUREUS   Final      Susceptibility   Methicillin resistant staphylococcus aureus - MIC*    CIPROFLOXACIN >=8 RESISTANT Resistant     ERYTHROMYCIN RESISTANT Resistant     GENTAMICIN <=0.5 SENSITIVE Sensitive     OXACILLIN >=4 RESISTANT Resistant     TETRACYCLINE <=1 SENSITIVE Sensitive     VANCOMYCIN  1 SENSITIVE Sensitive     TRIMETH/SULFA <=10 SENSITIVE Sensitive     CLINDAMYCIN RESISTANT Resistant     RIFAMPIN <=0.5 SENSITIVE Sensitive     Inducible Clindamycin POSITIVE Resistant     LINEZOLID 2 SENSITIVE Sensitive     * RARE METHICILLIN RESISTANT STAPHYLOCOCCUS AUREUS    Corean Fireman, MSN, NP-C [facility]  [email or web address] Pager: [phone] Office: [phone] [facility] Main Line: [phone] *Secure Chat Communication Welcome      [1]  Allergies Allergen Reactions   Nsaids Other (See Comments)    Contraindication due to CKD    Penicillins Itching   "

## 2024-09-01 NOTE — Progress Notes (Signed)
 Mobility Specialist: Progress Note   09/01/24 1600  Mobility  Activity Mechanically lifted from chair to bed  Level of Assistance Total care  Assistive Device MaxiMove  RLE Weight Bearing Per Provider Order WBAT  Activity Response Tolerated well  Mobility Referral Yes  Mobility visit 1 Mobility  Mobility Specialist Start Time (ACUTE ONLY) 1621  Mobility Specialist Stop Time (ACUTE ONLY) 1633  Mobility Specialist Time Calculation (min) (ACUTE ONLY) 12 min    Pt received in chair, requesting assistance back to bed. TotA+2 for transfer to bed via maximove. MaxA+2 for bed mobility to assist with rolling. Left in bed with all needs met, call bell in reach. Bed alarm on.   Ileana Lute Mobility Specialist Please contact via SecureChat or Rehab office at [phone]

## 2024-09-01 NOTE — Progress Notes (Signed)
 Subjective: 1 Day Post-Op Procedures (LRB): ARTHROSCOPY, KNEE WITH IRRIGATION AND DEBRIDEMENT (Left) Patient reports pain as mild.    Objective: Vital signs in last 24 hours: Temp:  [97.3 F (36.3 C)-98.3 F (36.8 C)] 97.6 F (36.4 C) (01/29 0735) Pulse Rate:  [77-101] 80 (01/29 0735) Resp:  [18-25] 19 (01/29 0735) BP: (119-134)/(83-97) 120/92 (01/29 0735) SpO2:  [4 %-99 %] 99 % (01/29 0735) Weight:  [100.5 kg] 100.5 kg (01/29 0651)  Intake/Output from previous day: 01/28 0701 - 01/29 0700 In: 1162.3 [P.O.:320; I.V.:742.3; IV Piggyback:100] Out: 60 [Drains:60] Intake/Output this shift: Total I/O In: -  Out: 400 [Urine:400]  Recent Labs    08/30/24 0613 09/01/24 0559  HGB 8.4* 8.4*   Recent Labs    08/30/24 0613 09/01/24 0559  WBC 8.1 8.1  RBC 3.37* 3.36*  HCT 28.3* 28.3*  PLT 388 356   Recent Labs    08/30/24 0613 09/01/24 0559  NA 144 138  K 4.7 5.5*  CL 111 108  CO2 20* 19*  BUN 42* 48*  CREATININE 2.00* 1.49*  GLUCOSE 98 151*  CALCIUM  9.3 9.2   No results for input(s): LABPT, INR in the last 72 hours.  Incision: dressing C/D/I No cellulitis present Compartment soft Hemovac drain in place- has put out approx 40 cc blood   Assessment/Plan: 1 Day Post-Op Procedures (LRB): ARTHROSCOPY, KNEE WITH IRRIGATION AND DEBRIDEMENT (Left) WBAT LLE Hemovac drain until tomorrow Continue abx per primary/ID team        Ronal LITTIE Grave 09/01/2024, 8:16 AM

## 2024-09-01 NOTE — TOC Progression Note (Signed)
 Transition of Care [facility]) - Progression Note    Patient Details  Name: Alison Whitehead MRN: [id number] Date of Birth: 1951/01/30  [facility]) CM/SW Contact  Bridget Cordella Simmonds, LCSW Phone Number: 09/01/2024, 10:36 AM  Clinical Narrative:  SNF auth approved: Cert # [id number]. 09/01/2024-09/05/2024.      Expected Discharge Plan: Skilled Nursing Facility Barriers to Discharge: English As A Second Language Teacher, Continued Medical Work up, No SNF bed               Expected Discharge Plan and Services In-house Referral: Clinical Social Work   Post Acute Care Choice: Skilled Nursing Facility Living arrangements for the past 2 months: Apartment, Skilled Nursing Facility                                       Social Drivers of Health (SDOH) Interventions SDOH Screenings   Food Insecurity: Patient Unable To Answer (08/27/2024)  Housing: Low Risk (08/27/2024)  Transportation Needs: No Transportation Needs (08/04/2024)  Utilities: Not At Risk (08/04/2024)  Depression (PHQ2-9): Low Risk (07/02/2022)  Social Connections: Unknown (08/04/2024)  Recent Concern: Social Connections - Moderately Isolated (07/18/2024)  Tobacco Use: Medium Risk (08/31/2024)    Readmission Risk Interventions    12/28/2022    1:24 PM  Readmission Risk Prevention Plan  Post Dischage Appt Complete  Medication Screening Not Complete  Medication Screening Not Complete Comment N/A  Transportation Screening Not Complete  Transportation Screening Comment N/A

## 2024-09-01 NOTE — Progress Notes (Signed)
 Physical Therapy Treatment Patient Details Name: Alison Whitehead MRN: [id number] DOB: [birth year] Today's Date: 09/01/2024   History of Present Illness Pt is a 74 y.o. F who presents 08/25/2024 with progressive weakness, falls, poor p.o. intake. Recent d/c from SNF. Pt with L knee septic arthritis now s/p 1. Arthroscopic left knee major synovectomy   2. Arthroscopic left knee irrigation and debridement  1/28. PMH includes CKD III, HTN, HLD, prediabetes.    PT Comments  Pt initially declining physical therapy intervention, however, she requested to call family member. PT helped set her up for a phone conversation with her daughter, and after speaking to her, she was in much better spirits and willing to participate. Utilized maxi move to transition out of bed over to chair. Pt able to participate in bed mobility (rolling) for placement of lift pad. Reclined in chair with Ice man set up on L knee and set pt up for lunch. Pt reports no appetite. Patient will benefit from continued inpatient follow up therapy, <3 hours/day.    If plan is discharge home, recommend the following: Two people to help with walking and/or transfers;Two people to help with bathing/dressing/bathroom   Can travel by private vehicle     No  Equipment Recommendations  Hospital bed;Hoyer lift    Recommendations for Other Services       Precautions / Restrictions Precautions Precautions: Fall;Other (comment) Recall of Precautions/Restrictions: Impaired Precaution/Restrictions Comments: Hemovac L knee Restrictions Weight Bearing Restrictions Per Provider Order: Yes RLE Weight Bearing Per Provider Order: Weight bearing as tolerated     Mobility  Bed Mobility Overal bed mobility: Needs Assistance Bed Mobility: Rolling Rolling: Max assist         General bed mobility comments: MaxA for rolling to R/L for placement of bed pad    Transfers Overall transfer level: Needs assistance Equipment used: Ambulation  equipment used               General transfer comment: Maxi move from bed to chair    Ambulation/Gait                   Stairs             Wheelchair Mobility     Tilt Bed    Modified Rankin (Stroke Patients Only)       Balance                                            Communication Communication Communication: No apparent difficulties  Cognition Arousal: Alert Behavior During Therapy: Restless, Anxious   PT - Cognitive impairments: No family/caregiver present to determine baseline                       PT - Cognition Comments: Decreased recall of any events during hospitalization Following commands: Impaired Following commands impaired: Follows one step commands inconsistently    Cueing Cueing Techniques: Verbal cues, Tactile cues  Exercises      General Comments        Pertinent Vitals/Pain Pain Assessment Pain Assessment: Faces Faces Pain Scale: Hurts even more Pain Location: back, L knee Pain Descriptors / Indicators: Grimacing, Guarding Pain Intervention(s): Limited activity within patient's tolerance, Premedicated before session    Home Living  Prior Function            PT Goals (current goals can now be found in the care plan section) Acute Rehab PT Goals Patient Stated Goal: did not state Potential to Achieve Goals: Fair Progress towards PT goals: Progressing toward goals    Frequency    Min 1X/week      PT Plan      Co-evaluation              AM-PAC PT 6 Clicks Mobility   Outcome Measure  Help needed turning from your back to your side while in a flat bed without using bedrails?: Total Help needed moving from lying on your back to sitting on the side of a flat bed without using bedrails?: Total Help needed moving to and from a bed to a chair (including a wheelchair)?: Total Help needed standing up from a chair using your arms (e.g.,  wheelchair or bedside chair)?: Total Help needed to walk in hospital room?: Total Help needed climbing 3-5 steps with a railing? : Total 6 Click Score: 6    End of Session   Activity Tolerance: Patient tolerated treatment well Patient left: in chair;with call bell/phone within reach;with chair alarm set Nurse Communication: Mobility status PT Visit Diagnosis: Muscle weakness (generalized) (M62.81);Pain Pain - Right/Left: Right Pain - part of body: Knee     Time: 1135-1200 PT Time Calculation (min) (ACUTE ONLY): 25 min  Charges:    $Therapeutic Activity: 23-37 mins PT General Charges $$ ACUTE PT VISIT: 1 Visit                     Aleck Daring, PT, DPT Acute Rehabilitation Services Office [phone]    Aleck ONEIDA Daring 09/01/2024, 2:12 PM

## 2024-09-01 NOTE — Progress Notes (Addendum)
" ° °  [facility] has been requested to perform a transesophageal echocardiogram on Alison Whitehead for bacteremia.     The patient does NOT have any absolute or relative contraindications to a Transesophageal Echocardiogram (TEE).  The patient has: No other conditions that may impact this procedure.  She reports last Ozempic  dose was several weeks ago.   After careful review of history and examination, the risks and benefits of transesophageal echocardiogram have been explained including risks of esophageal damage, perforation (1:10,000 risk), bleeding, pharyngeal hematoma as well as other potential complications associated with conscious sedation including aspiration, arrhythmia, respiratory failure and death. Alternatives to treatment were discussed, questions were answered. She is A+Ox3 this evening. She states she handles all of her own paperwork and feels comfortable signing the consent. She asked appropriate questions. She agrees to proceed. I offered to call her daughter as well but only goes to VM. Patient states she feels comfortable moving forward without this conversation. Scheduled for 12:45 tomorrow tentatively, as long as potassium improved. Notified IM team to please follow this up. Sent message to cardmaster inbox to f/u to ensure normalized before proceeding tomorrow.   Signed, Youssouf Shipley N Abbigayle Toole, PA-C  09/01/2024 5:52 PM   "

## 2024-09-02 ENCOUNTER — Encounter (HOSPITAL_COMMUNITY): Payer: Self-pay | Admitting: Cardiology

## 2024-09-02 ENCOUNTER — Inpatient Hospital Stay (HOSPITAL_COMMUNITY): Payer: Self-pay | Admitting: Anesthesiology

## 2024-09-02 ENCOUNTER — Other Ambulatory Visit (HOSPITAL_COMMUNITY): Payer: Self-pay

## 2024-09-02 ENCOUNTER — Inpatient Hospital Stay (HOSPITAL_COMMUNITY)

## 2024-09-02 ENCOUNTER — Other Ambulatory Visit: Payer: Self-pay

## 2024-09-02 ENCOUNTER — Encounter (HOSPITAL_COMMUNITY)
Admission: EM | Disposition: A | Discharge status: Skilled Nursing Facility | Payer: Self-pay | Source: Home / Self Care | Attending: Family Medicine

## 2024-09-02 DIAGNOSIS — I82452 Acute embolism and thrombosis of left peroneal vein: Secondary | ICD-10-CM | POA: Diagnosis not present

## 2024-09-02 DIAGNOSIS — I361 Nonrheumatic tricuspid (valve) insufficiency: Secondary | ICD-10-CM

## 2024-09-02 DIAGNOSIS — N179 Acute kidney failure, unspecified: Secondary | ICD-10-CM | POA: Diagnosis not present

## 2024-09-02 DIAGNOSIS — R7401 Elevation of levels of liver transaminase levels: Secondary | ICD-10-CM | POA: Diagnosis not present

## 2024-09-02 DIAGNOSIS — R7881 Bacteremia: Secondary | ICD-10-CM | POA: Diagnosis not present

## 2024-09-02 DIAGNOSIS — I2699 Other pulmonary embolism without acute cor pulmonale: Secondary | ICD-10-CM | POA: Insufficient documentation

## 2024-09-02 DIAGNOSIS — I10 Essential (primary) hypertension: Secondary | ICD-10-CM

## 2024-09-02 DIAGNOSIS — I82462 Acute embolism and thrombosis of left calf muscular vein: Secondary | ICD-10-CM | POA: Diagnosis not present

## 2024-09-02 DIAGNOSIS — B9562 Methicillin resistant Staphylococcus aureus infection as the cause of diseases classified elsewhere: Secondary | ICD-10-CM | POA: Diagnosis not present

## 2024-09-02 DIAGNOSIS — Z87891 Personal history of nicotine dependence: Secondary | ICD-10-CM

## 2024-09-02 DIAGNOSIS — E119 Type 2 diabetes mellitus without complications: Secondary | ICD-10-CM

## 2024-09-02 DIAGNOSIS — T8454XD Infection and inflammatory reaction due to internal left knee prosthesis, subsequent encounter: Secondary | ICD-10-CM | POA: Diagnosis not present

## 2024-09-02 LAB — BASIC METABOLIC PANEL WITH GFR
Anion gap: 14 (ref 5–15)
BUN: 55 mg/dL — ABNORMAL HIGH (ref 8–23)
CO2: 17 mmol/L — ABNORMAL LOW (ref 22–32)
Calcium: 8.9 mg/dL (ref 8.9–10.3)
Chloride: 102 mmol/L (ref 98–111)
Creatinine, Ser: 1.47 mg/dL — ABNORMAL HIGH (ref 0.44–1.00)
GFR, Estimated: 38 mL/min — ABNORMAL LOW
Glucose, Bld: 94 mg/dL (ref 70–99)
Potassium: 5.1 mmol/L (ref 3.5–5.1)
Sodium: 133 mmol/L — ABNORMAL LOW (ref 135–145)

## 2024-09-02 LAB — ECHO TEE

## 2024-09-02 LAB — GLUCOSE, CAPILLARY: Glucose-Capillary: 95 mg/dL (ref 70–99)

## 2024-09-02 MED ORDER — FERROUS SULFATE 325 (65 FE) MG PO TABS
325.0000 mg | ORAL_TABLET | ORAL | 0 refills | Status: AC
  Filled 2024-09-02: qty 15, 30d supply, fill #0

## 2024-09-02 MED ORDER — PANTOPRAZOLE SODIUM 40 MG PO TBEC
40.0000 mg | DELAYED_RELEASE_TABLET | Freq: Every day | ORAL | 0 refills | Status: AC
  Filled 2024-09-02: qty 30, 30d supply, fill #0

## 2024-09-02 MED ORDER — SODIUM CHLORIDE 0.9% FLUSH: 10.0000 mL | Freq: Two times a day (BID) | INTRAVENOUS | Status: DC

## 2024-09-02 MED ORDER — PHENYLEPHRINE 80 MCG/ML (10ML) SYRINGE FOR IV PUSH (FOR BLOOD PRESSURE SUPPORT)
PREFILLED_SYRINGE | INTRAVENOUS | Status: DC | PRN
  Administered 2024-09-02: 160 ug via INTRAVENOUS

## 2024-09-02 MED ORDER — ADULT MULTIVITAMIN W/MINERALS CH
1.0000 | ORAL_TABLET | Freq: Every day | ORAL | 0 refills | Status: AC
  Filled 2024-09-02: qty 30, 30d supply, fill #0

## 2024-09-02 MED ORDER — LIDOCAINE 2% (20 MG/ML) 5 ML SYRINGE
INTRAMUSCULAR | Status: DC | PRN
  Administered 2024-09-02: 80 mg via INTRAVENOUS

## 2024-09-02 MED ORDER — DAPTOMYCIN-SODIUM CHLORIDE 700-0.9 MG/100ML-% IV SOLN
700.0000 mg | Freq: Every day | INTRAVENOUS | 1 refills | Status: AC
  Filled 2024-09-02: qty 3000, 30d supply, fill #0

## 2024-09-02 MED ORDER — TRAZODONE HCL 50 MG PO TABS
25.0000 mg | ORAL_TABLET | Freq: Every evening | ORAL | 0 refills | Status: AC | PRN
  Filled 2024-09-02: qty 15, 30d supply, fill #0

## 2024-09-02 MED ORDER — PROPOFOL 500 MG/50ML IV EMUL
INTRAVENOUS | Status: DC | PRN
  Administered 2024-09-02: 100 ug/kg/min via INTRAVENOUS

## 2024-09-02 MED ORDER — PROPOFOL 10 MG/ML IV BOLUS
INTRAVENOUS | Status: DC | PRN
  Administered 2024-09-02 (×3): 20 mg via INTRAVENOUS

## 2024-09-02 MED ORDER — CREON 36000-114000 UNITS PO CPEP
36000.0000 [IU] | ORAL_CAPSULE | Freq: Two times a day (BID) | ORAL | 0 refills | Status: AC
  Filled 2024-09-02: qty 100, 50d supply, fill #0

## 2024-09-02 MED ORDER — OXYCODONE HCL 5 MG PO TABS
5.0000 mg | ORAL_TABLET | ORAL | 0 refills | Status: DC | PRN
  Filled 2024-09-02: qty 30, 5d supply, fill #0

## 2024-09-02 MED ORDER — ONDANSETRON HCL 4 MG/2ML IJ SOLN
INTRAMUSCULAR | Status: DC | PRN
  Administered 2024-09-02: 4 mg via INTRAVENOUS

## 2024-09-02 MED ORDER — CHLORHEXIDINE GLUCONATE CLOTH 2 % EX PADS
6.0000 | MEDICATED_PAD | Freq: Every day | CUTANEOUS | Status: DC
  Administered 2024-09-02: 6 via TOPICAL

## 2024-09-02 MED ORDER — DAPTOMYCIN IV (FOR PTA / DISCHARGE USE ONLY): 700.0000 mg | INTRAVENOUS | 0 refills | Status: AC

## 2024-09-02 MED ORDER — ONDANSETRON HCL 4 MG PO TABS
4.0000 mg | ORAL_TABLET | Freq: Four times a day (QID) | ORAL | 0 refills | Status: AC | PRN
  Filled 2024-09-02: qty 20, 5d supply, fill #0

## 2024-09-02 MED ORDER — PHENYLEPHRINE HCL-NACL 20-0.9 MG/250ML-% IV SOLN
INTRAVENOUS | Status: DC | PRN
  Administered 2024-09-02: 50 ug/min via INTRAVENOUS

## 2024-09-02 MED ORDER — APIXABAN 5 MG PO TABS
ORAL_TABLET | ORAL | 0 refills | Status: AC
  Filled 2024-09-02: qty 180, 83d supply, fill #0

## 2024-09-02 MED ORDER — BACLOFEN 5 MG PO TABS
5.0000 mg | ORAL_TABLET | Freq: Three times a day (TID) | ORAL | 0 refills | Status: AC | PRN
  Filled 2024-09-02: qty 30, 10d supply, fill #0

## 2024-09-02 MED ORDER — RIFAMPIN 300 MG PO CAPS
300.0000 mg | ORAL_CAPSULE | Freq: Two times a day (BID) | ORAL | 0 refills | Status: AC
  Filled 2024-09-02: qty 84, 42d supply, fill #0

## 2024-09-02 MED ORDER — ACETAMINOPHEN 325 MG PO TABS
650.0000 mg | ORAL_TABLET | Freq: Four times a day (QID) | ORAL | 0 refills | Status: AC | PRN
  Filled 2024-09-02: qty 180, 23d supply, fill #0

## 2024-09-02 MED ORDER — LACTATED RINGERS IV SOLN: INTRAVENOUS | Status: DC | PRN

## 2024-09-02 MED ORDER — OXYCODONE HCL 5 MG PO TABS: 5.0000 mg | ORAL_TABLET | ORAL | 0 refills | Status: AC | PRN

## 2024-09-02 MED ORDER — MELATONIN 5 MG PO TABS
5.0000 mg | ORAL_TABLET | Freq: Every day | ORAL | 0 refills | Status: AC
  Filled 2024-09-02: qty 30, 30d supply, fill #0

## 2024-09-02 MED ORDER — SENNOSIDES-DOCUSATE SODIUM 8.6-50 MG PO TABS
1.0000 | ORAL_TABLET | Freq: Every evening | ORAL | 0 refills | Status: AC | PRN
  Filled 2024-09-02: qty 30, 15d supply, fill #0

## 2024-09-02 MED ORDER — POLYETHYLENE GLYCOL 3350 17 GM/SCOOP PO POWD
17.0000 g | Freq: Every day | ORAL | 0 refills | Status: AC
  Filled 2024-09-02: qty 238, 14d supply, fill #0

## 2024-09-02 MED ORDER — SODIUM CHLORIDE 0.9% FLUSH: 10.0000 mL | INTRAVENOUS | Status: DC | PRN

## 2024-09-02 MED ORDER — RIFAMPIN 300 MG PO CAPS
300.0000 mg | ORAL_CAPSULE | Freq: Two times a day (BID) | ORAL | Status: DC
  Administered 2024-09-02: 300 mg via ORAL
  Filled 2024-09-02 (×2): qty 1

## 2024-09-02 NOTE — H&P (View-Only) (Signed)
 Subjective: 2 Days Post-Op Procedures (LRB): ARTHROSCOPY, KNEE WITH IRRIGATION AND DEBRIDEMENT (Left) Patient reports pain as mild.    Objective: Vital signs in last 24 hours: Temp:  [97.6 F (36.4 C)-98.1 F (36.7 C)] 98.1 F (36.7 C) (01/30 0220) Pulse Rate:  [80-88] 86 (01/30 0220) Resp:  [18-19] 18 (01/30 0220) BP: (87-120)/(54-92) 106/80 (01/30 0220) SpO2:  [95 %-99 %] 96 % (01/30 0220) Weight:  [100.9 kg] 100.9 kg (01/30 0500)  Intake/Output from previous day: 01/29 0701 - 01/30 0700 In: 240 [P.O.:240] Out: 667.5 [Urine:650; Drains:17.5] Intake/Output this shift: No intake/output data recorded.  Recent Labs    09/01/24 0559  HGB 8.4*   Recent Labs    09/01/24 0559  WBC 8.1  RBC 3.36*  HCT 28.3*  PLT 356   Recent Labs    09/01/24 0559 09/02/24 0551  NA 138 133*  K 5.5* 5.1  CL 108 102  CO2 19* 17*  BUN 48* 55*  CREATININE 1.49* 1.47*  GLUCOSE 151* 94  CALCIUM  9.2 8.9   No results for input(s): LABPT, INR in the last 72 hours.  Sensation intact distally Intact pulses distally Incision: scant drainage No cellulitis present Compartment soft Hemovac drain in place- minimal drainage overnight   Assessment/Plan: 2 Days Post-Op Procedures (LRB): ARTHROSCOPY, KNEE WITH IRRIGATION AND DEBRIDEMENT (Left) Up with therapy WBAT LLE WBC trending down Hemovac drain pulled by me this am Continue abx per primary/ID team F/u with ortho two weeks post-op      Ronal LITTIE Grave 09/02/2024, 7:16 AM

## 2024-09-02 NOTE — Anesthesia Preprocedure Evaluation (Signed)
"                                    Anesthesia Evaluation  Patient identified by MRN, date of birth, ID band Patient awake    Reviewed: Allergy & Precautions, NPO status , Patient's Chart, lab work & pertinent test results  Airway Mallampati: II  TM Distance: >3 FB Neck ROM: Full    Dental  (+) Edentulous Upper, Edentulous Lower   Pulmonary former smoker   Pulmonary exam normal        Cardiovascular hypertension, Pt. on medications and Pt. on home beta blockers  Rhythm:Regular Rate:Normal  ECHO 24'   1. Left ventricular ejection fraction, by estimation, is 60 to 65%. The  left ventricle has normal function. The left ventricle has no regional  wall motion abnormalities. Left ventricular diastolic parameters are  consistent with Grade I diastolic  dysfunction (impaired relaxation).   2. Right ventricular systolic function is normal. The right ventricular  size is normal. There is severely elevated pulmonary artery systolic  pressure. The estimated right ventricular systolic pressure is 61.5 mmHg.   3. The mitral valve is normal in structure. No evidence of mitral valve  regurgitation.   4. Tricuspid valve regurgitation is moderate.   5. The aortic valve is tricuspid. There is mild calcification of the  aortic valve. Aortic valve regurgitation is not visualized. Aortic valve  sclerosis/calcification is present, without any evidence of aortic  stenosis.   6. The inferior vena cava is dilated in size with <50% respiratory  variability, suggesting right atrial pressure of 15 mmHg.     Neuro/Psych  Headaches CVA, No Residual Symptoms  negative psych ROS   GI/Hepatic negative GI ROS, Neg liver ROS,,,  Endo/Other  diabetes, Type 2, Oral Hypoglycemic Agents    Renal/GU CRFRenal disease  negative genitourinary   Musculoskeletal  (+) Arthritis , Osteoarthritis,    Abdominal  (+) + obese  Peds  Hematology negative hematology ROS (+)   Anesthesia Other  Findings   Reproductive/Obstetrics                              Anesthesia Physical Anesthesia Plan  ASA: 3  Anesthesia Plan: MAC   Post-op Pain Management: Minimal or no pain anticipated   Induction: Intravenous  PONV Risk Score and Plan: 2 and Ondansetron , Treatment may vary due to age or medical condition, Propofol  infusion and TIVA  Airway Management Planned: Natural Airway  Additional Equipment: None  Intra-op Plan:   Post-operative Plan: Extubation in OR and Possible Post-op intubation/ventilation  Informed Consent: I have reviewed the patients History and Physical, chart, labs and discussed the procedure including the risks, benefits and alternatives for the proposed anesthesia with the patient or authorized representative who has indicated his/her understanding and acceptance.    Discussed DNR with power of attorney.   Dental advisory given  Plan Discussed with: CRNA and Anesthesiologist  Anesthesia Plan Comments: (Discussed risks of anesthesia with patient, including possibility of difficulty with spontaneous ventilation under anesthesia necessitating airway intervention, PONV, and rare risks such as cardiac or respiratory or neurological events, and allergic reactions. Discussed the role of CRNA in patient's perioperative care. Patient understands.)        Anesthesia Quick Evaluation  "

## 2024-09-02 NOTE — TOC Transition Note (Signed)
 Transition of Care [facility]) - Discharge Note   Patient Details  Name: Alison Whitehead MRN: [id number] Date of Birth: [birth year]  Transition of Care [facility]) CM/SW Contact:  Bridget Cordella Simmonds, LCSW Phone Number: 09/02/2024, 3:13 PM   Clinical Narrative:   Pt discharging to [facility].  RN call report to [phone].  PTAR called [phone].    Final next level of care: Skilled Nursing Facility Barriers to Discharge: Barriers Resolved   Patient Goals and CMS Choice Patient states their goals for this hospitalization and ongoing recovery are:: Discharge to SNF and eventually return home independently CMS Medicare.gov Compare Post Acute Care list provided to:: Patient Choice offered to / list presented to : Patient [place] ownership interest in [facility].provided to:: Patient    Discharge Placement              Patient chooses bed at: [facility] Patient to be transferred to facility by: ptar Name of family member notified: sister [name] Patient and family notified of of transfer: 09/02/24  Discharge Plan and Services Additional resources added to the After Visit Summary for   In-house Referral: Clinical Social Work   Post Acute Care Choice: Skilled Nursing Facility                               Social Drivers of Health (SDOH) Interventions SDOH Screenings   Food Insecurity: Patient Unable To Answer (08/27/2024)  Housing: Low Risk (08/27/2024)  Transportation Needs: No Transportation Needs (08/04/2024)  Utilities: Not At Risk (08/04/2024)  Depression (PHQ2-9): Low Risk (07/02/2022)  Social Connections: Unknown (08/04/2024)  Recent Concern: Social Connections - Moderately Isolated (07/18/2024)  Tobacco Use: Medium Risk (08/31/2024)     Readmission Risk Interventions    12/28/2022    1:24 PM  Readmission Risk Prevention Plan  Post Dischage Appt Complete  Medication Screening Not Complete  Medication Screening Not Complete Comment N/A   Transportation Screening Not Complete  Transportation Screening Comment N/A

## 2024-09-02 NOTE — Plan of Care (Signed)
" °  Problem: Education: Goal: Knowledge of General Education information will improve Description: Including pain rating scale, medication(s)/side effects and non-pharmacologic comfort measures Outcome: Adequate for Discharge   Problem: Health Behavior/Discharge Planning: Goal: Ability to manage health-related needs will improve Outcome: Adequate for Discharge   Problem: Clinical Measurements: Goal: Ability to maintain clinical measurements within normal limits will improve Outcome: Adequate for Discharge Goal: Will remain free from infection Outcome: Adequate for Discharge Goal: Diagnostic test results will improve Outcome: Adequate for Discharge Goal: Respiratory complications will improve Outcome: Adequate for Discharge Goal: Cardiovascular complication will be avoided Outcome: Adequate for Discharge   Problem: Activity: Goal: Risk for activity intolerance will decrease Outcome: Adequate for Discharge   Problem: Nutrition: Goal: Adequate nutrition will be maintained Outcome: Adequate for Discharge   Problem: Coping: Goal: Level of anxiety will decrease Outcome: Adequate for Discharge   Problem: Elimination: Goal: Will not experience complications related to bowel motility Outcome: Adequate for Discharge Goal: Will not experience complications related to urinary retention Outcome: Adequate for Discharge   Problem: Pain Managment: Goal: General experience of comfort will improve and/or be controlled Outcome: Adequate for Discharge   Problem: Safety: Goal: Ability to remain free from injury will improve Outcome: Adequate for Discharge   Problem: Skin Integrity: Goal: Risk for impaired skin integrity will decrease Outcome: Adequate for Discharge   Problem: Education: Goal: Knowledge of the prescribed therapeutic regimen will improve Outcome: Adequate for Discharge   Problem: Bowel/Gastric: Goal: Gastrointestinal status for postoperative course will  improve Outcome: Adequate for Discharge   Problem: Cardiac: Goal: Ability to maintain an adequate cardiac output Outcome: Adequate for Discharge Goal: Will show no evidence of cardiac arrhythmias Outcome: Adequate for Discharge   Problem: Nutritional: Goal: Will attain and maintain optimal nutritional status Outcome: Adequate for Discharge   Problem: Neurological: Goal: Will regain or maintain usual level of consciousness Outcome: Adequate for Discharge   Problem: Clinical Measurements: Goal: Ability to maintain clinical measurements within normal limits Outcome: Adequate for Discharge Goal: Postoperative complications will be avoided or minimized Outcome: Adequate for Discharge   Problem: Respiratory: Goal: Will regain and/or maintain adequate ventilation Outcome: Adequate for Discharge Goal: Respiratory status will improve Outcome: Adequate for Discharge   Problem: Skin Integrity: Goal: Demonstrates signs of wound healing without infection Outcome: Adequate for Discharge   Problem: Urinary Elimination: Goal: Will remain free from infection Outcome: Adequate for Discharge Goal: Ability to achieve and maintain adequate urine output Outcome: Adequate for Discharge   "

## 2024-09-02 NOTE — Progress Notes (Addendum)
 PHARMACY CONSULT NOTE FOR:  OUTPATIENT  PARENTERAL ANTIBIOTIC THERAPY (OPAT)  Indication: MRSA bacteremia with right knee involvement Regimen: Daptomycin  IV 700 mg every 24 hours  End date: 10/12/2024  Additionally, rifampin  PO 300 mg twice daily (end date 10/12/2024)  IV antibiotic discharge orders are pended. To discharging provider:  please sign these orders via discharge navigator,  Select New Orders & click on the button choice - Manage This Unsigned Work.    Thank you for allowing pharmacy to be a part of this patient's care.  Feliciano Close, PharmD PGY2 Infectious Diseases Pharmacy Resident

## 2024-09-02 NOTE — Progress Notes (Signed)
 Spoke with Carolyn and Lawanda, daughters, regarding PICC insertion and consent obtained.

## 2024-09-02 NOTE — Transfer of Care (Signed)
 Immediate Anesthesia Transfer of Care Note  Patient: Alison Whitehead  Procedure(s) Performed: TRANSESOPHAGEAL ECHOCARDIOGRAM  Patient Location: Cath Lab  Anesthesia Type:MAC  Level of Consciousness: awake, alert , and oriented  Airway & Oxygen Therapy: Patient Spontanous Breathing  Post-op Assessment: Report given to RN, Post -op Vital signs reviewed and stable, and Patient moving all extremities X 4  Post vital signs: Reviewed and stable  Last Vitals:  Vitals Value Taken Time  BP    Temp    Pulse    Resp    SpO2      Last Pain:  Vitals:   09/02/24 1250  TempSrc: Temporal  PainSc:       Patients Stated Pain Goal: 0 (09/02/24 0900)  Complications: No notable events documented.

## 2024-09-02 NOTE — Progress Notes (Signed)
 Peripherally Inserted Central Catheter Placement  The IV Nurse has discussed with the patient and/or persons authorized to consent for the patient, the purpose of this procedure and the potential benefits and risks involved with this procedure.  The benefits include less needle sticks, lab draws from the catheter, and the patient may be discharged home with the catheter. Risks include, but not limited to, infection, bleeding, blood clot (thrombus formation), and puncture of an artery; nerve damage and irregular heartbeat and possibility to perform a PICC exchange if needed/ordered by physician.  Alternatives to this procedure were also discussed.  Bard Power PICC patient education guide, fact sheet on infection prevention and patient information card has been provided to patient /or left at bedside.    PICC Placement Documentation  PICC Single Lumen 09/02/24 Right Brachial 42 cm 0 cm (Active)  Indication for Insertion or Continuance of Line Home intravenous therapies 09/02/24 1600  Exposed Catheter (cm) 0 cm 09/02/24 1600  Site Assessment Clean, Dry, Intact 09/02/24 1600  Line Status Flushed;Saline locked;Blood return noted 09/02/24 1600  Dressing Type Transparent;Securing device 09/02/24 1600  Dressing Status Antimicrobial disc/dressing in place;Clean, Dry, Intact 09/02/24 1600  Line Care Connections checked and tightened 09/02/24 1600  Line Adjustment (NICU/IV Team Only) No 09/02/24 1600  Dressing Intervention New dressing;Adhesive placed at insertion site (IV team only) 09/02/24 1600  Dressing Change Due 09/09/24 09/02/24 1600       Ethyl Priestly Renee 09/02/2024, 4:46 PM

## 2024-09-02 NOTE — Anesthesia Postprocedure Evaluation (Signed)
"   Anesthesia Post Note  Patient: Alison Whitehead  Procedure(s) Performed: TRANSESOPHAGEAL ECHOCARDIOGRAM     Patient location during evaluation: Cath Lab Anesthesia Type: MAC Level of consciousness: awake and alert Pain management: pain level controlled Vital Signs Assessment: post-procedure vital signs reviewed and stable Respiratory status: spontaneous breathing, nonlabored ventilation, respiratory function stable and patient connected to nasal cannula oxygen Cardiovascular status: stable and blood pressure returned to baseline Postop Assessment: no apparent nausea or vomiting Anesthetic complications: no   No notable events documented.  Last Vitals:  Vitals:   09/02/24 1250 09/02/24 1331  BP: 129/84 93/68  Pulse: 85 89  Resp: 18 13  Temp: (!) 36.3 C (!) 36.2 C  SpO2: 95% 94%    Last Pain:  Vitals:   09/02/24 1331  TempSrc: Tympanic  PainSc: Asleep                 Rome Ade      "

## 2024-09-02 NOTE — Progress Notes (Signed)
 OT Cancellation Note  Patient Details Name: Alison Whitehead MRN: [id number] DOB: 1951/03/03   Cancelled Treatment:    Reason Eval/Treat Not Completed: Patient at procedure or test/ unavailable   Tishara Pizano M. [place], OTR/L [facility] [phone] Secure Chat Preferred  [name] 09/02/2024, 1:04 PM

## 2024-09-02 NOTE — Progress Notes (Addendum)
 Subjective: 2 Days Post-Op Procedures (LRB): ARTHROSCOPY, KNEE WITH IRRIGATION AND DEBRIDEMENT (Left) Patient reports pain as mild.    Objective: Vital signs in last 24 hours: Temp:  [97.6 F (36.4 C)-98.1 F (36.7 C)] 98.1 F (36.7 C) (01/30 0220) Pulse Rate:  [80-88] 86 (01/30 0220) Resp:  [18-19] 18 (01/30 0220) BP: (87-120)/(54-92) 106/80 (01/30 0220) SpO2:  [95 %-99 %] 96 % (01/30 0220) Weight:  [100.9 kg] 100.9 kg (01/30 0500)  Intake/Output from previous day: 01/29 0701 - 01/30 0700 In: 240 [P.O.:240] Out: 667.5 [Urine:650; Drains:17.5] Intake/Output this shift: No intake/output data recorded.  Recent Labs    09/01/24 0559  HGB 8.4*   Recent Labs    09/01/24 0559  WBC 8.1  RBC 3.36*  HCT 28.3*  PLT 356   Recent Labs    09/01/24 0559 09/02/24 0551  NA 138 133*  K 5.5* 5.1  CL 108 102  CO2 19* 17*  BUN 48* 55*  CREATININE 1.49* 1.47*  GLUCOSE 151* 94  CALCIUM  9.2 8.9   No results for input(s): LABPT, INR in the last 72 hours.  Sensation intact distally Intact pulses distally Incision: scant drainage No cellulitis present Compartment soft Hemovac drain in place- minimal drainage overnight   Assessment/Plan: 2 Days Post-Op Procedures (LRB): ARTHROSCOPY, KNEE WITH IRRIGATION AND DEBRIDEMENT (Left) Up with therapy WBAT LLE WBC trending down Hemovac drain pulled by me this am Continue abx per primary/ID team F/u with ortho two weeks post-op      Ronal LITTIE Grave 09/02/2024, 7:16 AM

## 2024-09-02 NOTE — Progress Notes (Signed)
 Echocardiogram Echocardiogram Transesophageal has been performed.  Merlynn Argyle 09/02/2024, 1:27 PM

## 2024-09-02 NOTE — Progress Notes (Addendum)
 "                                                            [facility] Infectious Diseases Follow Up Note  Patient Identification: Patient Name: Alison Whitehead MRN: [id number] Admit Date: 08/25/2024 10:37 AM Age: 74 y.o.Today's Date: 09/02/2024   Reason for Visit: MRSA bacteremia, PJI  Principal Problem:   MRSA bacteremia Active Problems:   Essential hypertension   ARTHRITIS, KNEE   Right pontine stroke (HCC)   Prediabetes   Generalized weakness   Primary osteoarthritis of right knee   Failure to thrive (child)   CKD stage 3b, GFR 30-44 ml/min (HCC)   Hyperkalemia   Failure to thrive in adult   Chronic knee pain   Palliative care by specialist   Infection of prosthetic left knee joint   Antibiotics: Vancomycin  1/25-  Lines/Hardwares:   Interval Events: Remains afebrile Labs remarkable for creatinine at 1.47  assessment 74 year old female with prior history of HTN, HLD, CKD, AOCD, prediabetes, chronic pancreatic insufficiency, FTT who presented to the ED with progressive generalized weakness, falls, poor oral intake.  Found to have  # MRSA bacteremia # Left prosthetic joint infection - 1/26 blood cx NG in 4 days  - 1/26 s/p joint aspiration with WBC 27K, neutrophilic no crystals. Cx  MRSA - 1/28 s/p I&D of left knee - 1/30 TEE negative for vegetations or endocarditis  # Acute DVT left peroneal vein plus acute intramuscular thrombosis involving the left soleal veins + segmental bilateral PE  # Transaminitis  # AKI on CKD - cr improving # FTT # Lower back pain - 1/26 MRI L-spine negative for discitis/osteomyelitis  Recommendations Continue daptomycin . Will add PO rifampin  due to PJI. Mild transaminitis due to sepsis and expect to resolve. Will monitor liver enzymes. Fu sensi of daptomycin .  Place PICC Plan for 6 weeks of IV daptomycin  and rifampin  > PO antibiotics for 6-12 months. See OPAT  Monitor CBC and CMP weekly  Contact precautions D/W primary team ID  will sign off  OPAT Diagnosis: Bacteremia, PJI  Culture Result: MRSA  Allergies[1]  OPAT Orders Discharge antibiotics to be given via PICC line Discharge antibiotics: Daptomycin  per pharmacy + Rifampin  Per pharmacy protocol  Duration: 6 weeks End Date: 10/14/24  [facility] Care Per Protocol:  Home health RN for IV administration and teaching; PICC line care and labs.    Labs weekly while on IV antibiotics: X_ CBC with differential __ BMP X__ CMP X__ CRP X__ ESR __ Vancomycin  trough X__ CK  X__ Please pull PIC at completion of IV antibiotics __ Please leave PIC in place until doctor has seen patient or been notified  Fax weekly labs to [phone]  Clinic Follow Up Appt: 3/5 at 9: 30 am   Rest of the management as per the primary team. Thank you for the consult. Please page with pertinent questions or concerns.  ______________________________________________________________________ Subjective patient seen and examined at the bedside.  She is n.p.o. for TEE.  Discussed plan for IV antibiotics with PICC line.  She is agreeable  Past Medical History:  Diagnosis Date   Allergy    seasonal   Arthritis    CKD (chronic kidney disease)    DDD (degenerative disc disease)    Degenerative disc disease, lumbar  Hypertension    IBS (irritable bowel syndrome)    Osteopenia    Osteoporosis    osteopenia   Stroke (HCC) 09/2019   Past Surgical History:  Procedure Laterality Date   IR US  GUIDE BX ASP/DRAIN  08/29/2024   KNEE ARTHROSCOPY W/ DEBRIDEMENT Left 08/31/2024   Procedure: ARTHROSCOPY, KNEE WITH IRRIGATION AND DEBRIDEMENT;  Surgeon: Jerri Kay HERO, MD;  Location: [facility] OR;  Service: Orthopedics;  Laterality: Left;   SHOULDER SURGERY Left    lipoma   TOTAL KNEE ARTHROPLASTY Left 12/26/2022   Procedure: LEFT TOTAL KNEE ARTHROPLASTY;  Surgeon: Harden Jerona GAILS, MD;  Location: [facility] OR;  Service: Orthopedics;  Laterality: Left;   Vitals BP 103/70 (BP Location: Right Arm)    Pulse 86   Temp (!) 97.5 F (36.4 C) (Oral)   Resp 16   Ht 5' 7 (1.702 m)   Wt 100.9 kg   SpO2 93%   BMI 34.84 kg/m     Physical Exam Constitutional: Awake, alert, nontoxic-appearing    Comments: Oral mucosa moist  Cardiovascular:     Rate and Rhythm: Normal rate    Heart sounds:   Pulmonary:     Effort: Pulmonary effort is normal.     Comments:  Abdominal:     Palpations:      Tenderness: Nondistended   Musculoskeletal:        General: No swelling or tenderness other peripheral joints Left knee in a bandage  Skin:    Comments: No rashes   Neurological:     General: Awake, alert, follows commands  Pertinent Microbiology Results for orders placed or performed during the hospital encounter of 08/25/24  Urine Culture (for pregnant, neutropenic or urologic patients or patients with an indwelling urinary catheter)     Status: Abnormal   Collection Time: 08/27/24  7:26 PM   Specimen: Urine, Clean Catch  Result Value Ref Range Status   Specimen Description URINE, CLEAN CATCH  Final   Special Requests   Final    NONE Performed at [facility], [address] [place], [place] [address]    Culture (A)  Final    >=100,000 COLONIES/mL METHICILLIN RESISTANT STAPHYLOCOCCUS AUREUS   Report Status 08/29/2024 FINAL  Final   Organism ID, Bacteria METHICILLIN RESISTANT STAPHYLOCOCCUS AUREUS (A)  Final      Susceptibility   Methicillin resistant staphylococcus aureus - MIC*    CIPROFLOXACIN >=8 RESISTANT Resistant     GENTAMICIN <=0.5 SENSITIVE Sensitive     NITROFURANTOIN <=16 SENSITIVE Sensitive     OXACILLIN >=4 RESISTANT Resistant     TETRACYCLINE <=1 SENSITIVE Sensitive     VANCOMYCIN  1 SENSITIVE Sensitive     TRIMETH/SULFA <=10 SENSITIVE Sensitive     RIFAMPIN  <=0.5 SENSITIVE Sensitive     Inducible Clindamycin POSITIVE Resistant     LINEZOLID 2 SENSITIVE Sensitive     * >=100,000 COLONIES/mL METHICILLIN RESISTANT STAPHYLOCOCCUS AUREUS  Culture, blood  (Routine X 2) w Reflex to ID Panel     Status: Abnormal (Preliminary result)   Collection Time: 08/27/24 10:02 PM   Specimen: BLOOD LEFT HAND  Result Value Ref Range Status   Specimen Description BLOOD LEFT HAND  Final   Special Requests   Final    BOTTLES DRAWN AEROBIC AND ANAEROBIC Blood Culture adequate volume   Culture  Setup Time   Final    GRAM POSITIVE COCCI IN BOTH AEROBIC AND ANAEROBIC BOTTLES CRITICAL RESULT CALLED TO, READ BACK BY AND VERIFIED WITH: PHARMD T.  DANG [id number] AT 1345, ADC    Culture (A)  Final    METHICILLIN RESISTANT STAPHYLOCOCCUS AUREUS Sent to [facility] for further susceptibility testing. Performed at [facility], [address] [place], [place] [address]    Report Status PENDING  Incomplete   Organism ID, Bacteria METHICILLIN RESISTANT STAPHYLOCOCCUS AUREUS  Final      Susceptibility   Methicillin resistant staphylococcus aureus - MIC*    CIPROFLOXACIN >=8 RESISTANT Resistant     ERYTHROMYCIN RESISTANT Resistant     GENTAMICIN <=0.5 SENSITIVE Sensitive     OXACILLIN >=4 RESISTANT Resistant     TETRACYCLINE <=1 SENSITIVE Sensitive     VANCOMYCIN  1 SENSITIVE Sensitive     TRIMETH/SULFA <=10 SENSITIVE Sensitive     CLINDAMYCIN RESISTANT Resistant     RIFAMPIN  <=0.5 SENSITIVE Sensitive     Inducible Clindamycin POSITIVE Resistant     LINEZOLID 2 SENSITIVE Sensitive     * METHICILLIN RESISTANT STAPHYLOCOCCUS AUREUS  Blood Culture ID Panel (Reflexed)     Status: Abnormal   Collection Time: 08/27/24 10:02 PM  Result Value Ref Range Status   Enterococcus faecalis NOT DETECTED NOT DETECTED Final   Enterococcus Faecium NOT DETECTED NOT DETECTED Final   Listeria monocytogenes NOT DETECTED NOT DETECTED Final   Staphylococcus species DETECTED (A) NOT DETECTED Final    Comment: CRITICAL RESULT CALLED TO, READ BACK BY AND VERIFIED WITH: PHARMD T. DANG [id number] AT 1345, ADC    Staphylococcus aureus (BCID) DETECTED (A) NOT DETECTED Final    Comment:  Methicillin (oxacillin)-resistant Staphylococcus aureus (MRSA). MRSA is predictably resistant to beta-lactam antibiotics (except ceftaroline). Preferred therapy is vancomycin  unless clinically contraindicated. Patient requires contact precautions if  hospitalized. CRITICAL RESULT CALLED TO, READ BACK BY AND VERIFIED WITH: PHARMD T. DANG [id number] AT 1345, ADC    Staphylococcus epidermidis NOT DETECTED NOT DETECTED Final   Staphylococcus lugdunensis NOT DETECTED NOT DETECTED Final   Streptococcus species NOT DETECTED NOT DETECTED Final   Streptococcus agalactiae NOT DETECTED NOT DETECTED Final   Streptococcus pneumoniae NOT DETECTED NOT DETECTED Final   Streptococcus pyogenes NOT DETECTED NOT DETECTED Final   A.calcoaceticus-baumannii NOT DETECTED NOT DETECTED Final   Bacteroides fragilis NOT DETECTED NOT DETECTED Final   Enterobacterales NOT DETECTED NOT DETECTED Final   Enterobacter cloacae complex NOT DETECTED NOT DETECTED Final   Escherichia coli NOT DETECTED NOT DETECTED Final   Klebsiella aerogenes NOT DETECTED NOT DETECTED Final   Klebsiella oxytoca NOT DETECTED NOT DETECTED Final   Klebsiella pneumoniae NOT DETECTED NOT DETECTED Final   Proteus species NOT DETECTED NOT DETECTED Final   Salmonella species NOT DETECTED NOT DETECTED Final   Serratia marcescens NOT DETECTED NOT DETECTED Final   Haemophilus influenzae NOT DETECTED NOT DETECTED Final   Neisseria meningitidis NOT DETECTED NOT DETECTED Final   Pseudomonas aeruginosa NOT DETECTED NOT DETECTED Final   Stenotrophomonas maltophilia NOT DETECTED NOT DETECTED Final   Candida albicans NOT DETECTED NOT DETECTED Final   Candida auris NOT DETECTED NOT DETECTED Final   Candida glabrata NOT DETECTED NOT DETECTED Final   Candida krusei NOT DETECTED NOT DETECTED Final   Candida parapsilosis NOT DETECTED NOT DETECTED Final   Candida tropicalis NOT DETECTED NOT DETECTED Final   Cryptococcus neoformans/gattii NOT DETECTED NOT DETECTED  Final   Meth resistant mecA/C and MREJ DETECTED (A) NOT DETECTED Final    Comment: CRITICAL RESULT CALLED TO, READ BACK BY AND VERIFIED WITH: PHARMD T. [facility] [id number] AT 1345, ADC Performed at [facility]  Lab, [address] [place], [place] [address]   MIC (1 Drug)-     Status: Abnormal   Collection Time: 08/27/24 10:02 PM  Result Value Ref Range Status   Min Inhibitory Conc (1 Drug) Preliminary report (A)  Final    Comment: (NOTE) Performed At: [facility] [address] [place], [place] [id number] Jennette Shorter MD Ey:[id number]    Source DAPTOMYCIN  MIC MRSA BLOOD CULTURE  Final    Comment: Performed at [facility], [address] [place], [place] [address]  MIC Result     Status: Abnormal   Collection Time: 08/27/24 10:02 PM  Result Value Ref Range Status   Result 1 (MIC) Comment (A)  Final    Comment: (NOTE) Methicillin - resistant Staphylococcus aureus Identification performed by account, not confirmed by this laboratory. DAPTOMYCIN  Performed At: [facility] [address] [place], [place] [id number] Jennette Shorter MD Ey:[id number]   Culture, blood (Routine X 2) w Reflex to ID Panel     Status: Abnormal   Collection Time: 08/27/24 10:03 PM   Specimen: BLOOD LEFT ARM  Result Value Ref Range Status   Specimen Description BLOOD LEFT ARM  Final   Special Requests   Final    BOTTLES DRAWN AEROBIC AND ANAEROBIC Blood Culture adequate volume   Culture  Setup Time   Final    GRAM POSITIVE COCCI IN CLUSTERS IN BOTH AEROBIC AND ANAEROBIC BOTTLES CRITICAL VALUE NOTED.  VALUE IS CONSISTENT WITH PREVIOUSLY REPORTED AND CALLED VALUE.    Culture (A)  Final    STAPHYLOCOCCUS AUREUS SUSCEPTIBILITIES PERFORMED ON PREVIOUS CULTURE WITHIN THE LAST 5 DAYS. Performed at [facility], [address], [place], [place] [address]    Report Status 08/30/2024 FINAL  Final  Culture, blood (Routine X 2) w Reflex to ID Panel     Status: None (Preliminary  result)   Collection Time: 08/29/24  3:48 AM   Specimen: BLOOD  Result Value Ref Range Status   Specimen Description BLOOD BLOOD LEFT ARM  Final   Special Requests   Final    BOTTLES DRAWN AEROBIC AND ANAEROBIC Blood Culture adequate volume   Culture   Final    NO GROWTH 4 DAYS Performed at [facility], [address], [place], [place] [address]    Report Status PENDING  Incomplete  Culture, blood (Routine X 2) w Reflex to ID Panel     Status: None (Preliminary result)   Collection Time: 08/29/24  3:52 AM   Specimen: BLOOD LEFT HAND  Result Value Ref Range Status   Specimen Description BLOOD LEFT HAND  Final   Special Requests   Final    BOTTLES DRAWN AEROBIC AND ANAEROBIC Blood Culture adequate volume   Culture   Final    NO GROWTH 4 DAYS Performed at [facility], [address] [place], [place] [address]    Report Status PENDING  Incomplete  Body fluid culture w Gram Stain     Status: None   Collection Time: 08/29/24  2:00 PM   Specimen: Synovium; Body Fluid  Result Value Ref Range Status   Specimen Description SYNOVIAL  Final   Special Requests KNEE JOINT  Final   Gram Stain   Final    ABUNDANT WBC PRESENT, PREDOMINANTLY PMN NO ORGANISMS SEEN    Culture   Final    RARE METHICILLIN RESISTANT STAPHYLOCOCCUS AUREUS CRITICAL RESULT CALLED TO, READ BACK BY AND VERIFIED WITH: PHARMD D.FREEMAN AT 1104  ON 08/30/2024 BY T.SAAD. Performed at [facility], [address], [place], [place] [address]    Report Status 08/31/2024 FINAL  Final   Organism ID, Bacteria METHICILLIN RESISTANT STAPHYLOCOCCUS AUREUS  Final      Susceptibility   Methicillin resistant staphylococcus aureus - MIC*    CIPROFLOXACIN >=8 RESISTANT Resistant     ERYTHROMYCIN RESISTANT Resistant     GENTAMICIN <=0.5 SENSITIVE Sensitive     OXACILLIN >=4 RESISTANT Resistant     TETRACYCLINE <=1 SENSITIVE Sensitive     VANCOMYCIN  1 SENSITIVE Sensitive     TRIMETH/SULFA <=10 SENSITIVE  Sensitive     CLINDAMYCIN RESISTANT Resistant     RIFAMPIN  <=0.5 SENSITIVE Sensitive     Inducible Clindamycin POSITIVE Resistant     LINEZOLID 2 SENSITIVE Sensitive     * RARE METHICILLIN RESISTANT STAPHYLOCOCCUS AUREUS   Pertinent Lab.    Latest Ref Rng & Units 09/01/2024    5:59 AM 08/30/2024    6:13 AM 08/29/2024    3:48 AM  CBC  WBC 4.0 - 10.5 K/uL 8.1  8.1  10.7   Hemoglobin 12.0 - 15.0 g/dL 8.4  8.4  8.3   Hematocrit 36.0 - 46.0 % 28.3  28.3  27.5   Platelets 150 - 400 K/uL 356  388  442       Latest Ref Rng & Units 09/02/2024    5:51 AM 09/01/2024    5:59 AM 08/30/2024    6:13 AM  CMP  Glucose 70 - 99 mg/dL 94  848  98   BUN 8 - 23 mg/dL 55  48  42   Creatinine 0.44 - 1.00 mg/dL 8.52  8.50  7.99   Sodium 135 - 145 mmol/L 133  138  144   Potassium 3.5 - 5.1 mmol/L 5.1  5.5  4.7   Chloride 98 - 111 mmol/L 102  108  111   CO2 22 - 32 mmol/L 17  19  20    Calcium  8.9 - 10.3 mg/dL 8.9  9.2  9.3   Total Protein 6.5 - 8.1 g/dL   7.4   Total Bilirubin 0.0 - 1.2 mg/dL   0.3   Alkaline Phos 38 - 126 U/L   183   AST 15 - 41 U/L   70   ALT 0 - 44 U/L   85     Pertinent Imaging today Plain films and CT images have been personally visualized and interpreted; radiology reports have been reviewed. Decision making incorporated into the Impression /   US  EKG SITE RITE Result Date: 09/02/2024 If Site Rite image not attached, placement could not be confirmed due to current cardiac rhythm.  ECHO TEE Result Date: 09/02/2024    TRANSESOPHOGEAL ECHO REPORT   Patient Name:   [facility] Date of Exam: 09/02/2024 Medical Rec #:  [id number]            Height:       67.0 in Accession #:    [id number]           Weight:       222.4 lb Date of Birth:  06-Sep-1950           BSA:          2.116 m Patient Age:    73 years             BP:           129/84 mmHg Patient Gender: F  HR:           84 bpm. Exam Location:  Inpatient Procedure: Transesophageal Echo, Color Doppler and  Cardiac Doppler (Both            Spectral and Color Flow Doppler were utilized during procedure). Indications:     Endocarditis  History:         Patient has prior history of Echocardiogram examinations, most                  recent 08/30/2024. CKD; Risk Factors:Hypertension and Former                  Smoker.  Sonographer:     Merlynn Argyle Referring Phys:  [id number] RAPHAEL SAILOR DUNN Diagnosing Phys: Oneil Parchment MD PROCEDURE: After discussion of the risks and benefits of a TEE, an informed consent was obtained from the patient. The transesophogeal probe was passed without difficulty through the esophogus of the patient. Imaged were obtained with the patient in a left lateral decubitus position. Sedation performed by different physician. The patient was monitored while under deep sedation. Anesthestetic sedation was provided intravenously by Anesthesiology: 110.45mg  of Propofol , 80mg  of Lidocaine . The patient's vital signs; including heart rate, blood pressure, and oxygen saturation; remained stable throughout the procedure. The patient developed no complications during the procedure.  IMPRESSIONS  1. Left ventricular ejection fraction, by estimation, is 60 to 65%. The left ventricle has normal function. The left ventricle has no regional wall motion abnormalities.  2. Right ventricular systolic function is normal. The right ventricular size is normal. There is moderately elevated pulmonary artery systolic pressure. The estimated right ventricular systolic pressure is 46.6 mmHg.  3. No left atrial/left atrial appendage thrombus was detected.  4. Right atrial size was mildly dilated.  5. The mitral valve is normal in structure. Trivial mitral valve regurgitation. No evidence of mitral stenosis.  6. Tricuspid valve regurgitation is moderate.  7. The aortic valve is tricuspid. Aortic valve regurgitation is trivial. No aortic stenosis is present.  8. The inferior vena cava is normal in size with greater than 50% respiratory  variability, suggesting right atrial pressure of 3 mmHg. Conclusion(s)/Recommendation(s): Normal biventricular function without evidence of hemodynamically significant valvular heart disease. FINDINGS  Left Ventricle: Left ventricular ejection fraction, by estimation, is 60 to 65%. The left ventricle has normal function. The left ventricle has no regional wall motion abnormalities. The left ventricular internal cavity size was normal in size. There is  no left ventricular hypertrophy. Right Ventricle: The right ventricular size is normal. No increase in right ventricular wall thickness. Right ventricular systolic function is normal. There is moderately elevated pulmonary artery systolic pressure. The tricuspid regurgitant velocity is 3.30 m/s, and with an assumed right atrial pressure of 3 mmHg, the estimated right ventricular systolic pressure is 46.6 mmHg. Left Atrium: Left atrial size was normal in size. No left atrial/left atrial appendage thrombus was detected. Right Atrium: Right atrial size was mildly dilated. Pericardium: There is no evidence of pericardial effusion. Mitral Valve: The mitral valve is normal in structure. Trivial mitral valve regurgitation. No evidence of mitral valve stenosis. Tricuspid Valve: The tricuspid valve is normal in structure. Tricuspid valve regurgitation is moderate . No evidence of tricuspid stenosis. Aortic Valve: The aortic valve is tricuspid. Aortic valve regurgitation is trivial. No aortic stenosis is present. Pulmonic Valve: The pulmonic valve was normal in structure. Pulmonic valve regurgitation is trivial. No evidence of pulmonic stenosis. Aorta: The aortic root and ascending aorta  are structurally normal, with no evidence of dilitation. Venous: The inferior vena cava is normal in size with greater than 50% respiratory variability, suggesting right atrial pressure of 3 mmHg. IAS/Shunts: No atrial level shunt detected by color flow Doppler. Additional Comments: Spectral  Doppler performed. TRICUSPID VALVE TR Peak grad:   43.6 mmHg TR Vmax:        330.00 cm/s Oneil Parchment MD Electronically signed by Oneil Parchment MD Signature Date/Time: 09/02/2024/2:01:10 PM    Final    I personally spent a total of 51 minutes in the care of the patient today including preparing to see the patient, getting/reviewing separately obtained history, performing a medically appropriate exam/evaluation, counseling and educating, placing orders, referring and communicating with other health care professionals, documenting clinical information in the EHR, independently interpreting results, communicating results, and coordinating care.  Electronically signed by:   Annalee Orem, MD Infectious Disease Physician [facility] Pager: [phone]     [1]  Allergies Allergen Reactions   Nsaids Other (See Comments)    Contraindication due to CKD    Penicillins Itching   "

## 2024-09-02 NOTE — Interval H&P Note (Signed)
 History and Physical Interval Note:  09/02/2024 1:08 PM  Alison Whitehead  has presented today for surgery, with the diagnosis of bacteremia.  The various methods of treatment have been discussed with the patient and family. After consideration of risks, benefits and other options for treatment, the patient has consented to  Procedures: TRANSESOPHAGEAL ECHOCARDIOGRAM (N/A) as a surgical intervention.  The patient's history has been reviewed, patient examined, no change in status, stable for surgery.  I have reviewed the patient's chart and labs.  Questions were answered to the patient's satisfaction.     [employer]

## 2024-09-03 LAB — CULTURE, BLOOD (ROUTINE X 2)
Culture: NO GROWTH
Culture: NO GROWTH
Special Requests: ADEQUATE
Special Requests: ADEQUATE

## 2024-09-05 LAB — MINIMUM INHIBITORY CONC. (1 DRUG)

## 2024-09-05 LAB — MIC RESULT

## 2024-09-06 ENCOUNTER — Telehealth: Payer: Self-pay | Admitting: Orthopedic Surgery

## 2024-09-06 LAB — CULTURE, BLOOD (ROUTINE X 2): Special Requests: ADEQUATE

## 2024-09-06 NOTE — Progress Notes (Signed)
 Patient discharged, Important Message Letter mailed to patient.

## 2024-09-06 NOTE — Telephone Encounter (Signed)
 Barnie form [facility] called wanting a hospital follow up for the pt. Call back number is [phone].

## 2024-09-20 ENCOUNTER — Encounter: Admitting: Orthopaedic Surgery

## 2024-10-06 ENCOUNTER — Inpatient Hospital Stay: Payer: Self-pay | Admitting: Infectious Diseases
